# Patient Record
Sex: Female | Born: 1945
Health system: Southern US, Community
[De-identification: ages and names within clinical notes are randomized; demographics above are authoritative.]

## PROBLEM LIST (undated history)

## (undated) DIAGNOSIS — K5790 Diverticulosis of intestine, part unspecified, without perforation or abscess without bleeding: Secondary | ICD-10-CM

## (undated) DIAGNOSIS — D414 Neoplasm of uncertain behavior of bladder: Secondary | ICD-10-CM

## (undated) DIAGNOSIS — I10 Essential (primary) hypertension: Secondary | ICD-10-CM

## (undated) DIAGNOSIS — K644 Residual hemorrhoidal skin tags: Secondary | ICD-10-CM

## (undated) DIAGNOSIS — Z8619 Personal history of other infectious and parasitic diseases: Secondary | ICD-10-CM

## (undated) DIAGNOSIS — D131 Benign neoplasm of stomach: Secondary | ICD-10-CM

## (undated) DIAGNOSIS — D649 Anemia, unspecified: Secondary | ICD-10-CM

## (undated) DIAGNOSIS — T7840XA Allergy, unspecified, initial encounter: Secondary | ICD-10-CM

## (undated) DIAGNOSIS — R809 Proteinuria, unspecified: Secondary | ICD-10-CM

## (undated) DIAGNOSIS — F419 Anxiety disorder, unspecified: Secondary | ICD-10-CM

## (undated) DIAGNOSIS — I38 Endocarditis, valve unspecified: Secondary | ICD-10-CM

## (undated) DIAGNOSIS — Z7189 Other specified counseling: Secondary | ICD-10-CM

## (undated) DIAGNOSIS — R51 Headache: Secondary | ICD-10-CM

## (undated) DIAGNOSIS — M1990 Unspecified osteoarthritis, unspecified site: Secondary | ICD-10-CM

## (undated) DIAGNOSIS — T8859XA Other complications of anesthesia, initial encounter: Secondary | ICD-10-CM

## (undated) DIAGNOSIS — N184 Chronic kidney disease, stage 4 (severe): Secondary | ICD-10-CM

## (undated) DIAGNOSIS — G4700 Insomnia, unspecified: Secondary | ICD-10-CM

## (undated) DIAGNOSIS — F329 Major depressive disorder, single episode, unspecified: Secondary | ICD-10-CM

## (undated) DIAGNOSIS — N289 Disorder of kidney and ureter, unspecified: Secondary | ICD-10-CM

## (undated) DIAGNOSIS — I4891 Unspecified atrial fibrillation: Secondary | ICD-10-CM

## (undated) HISTORY — DX: Residual hemorrhoidal skin tags: K64.4

## (undated) HISTORY — DX: Other specified counseling: Z71.89

## (undated) HISTORY — DX: Benign neoplasm of stomach: D13.1

## (undated) HISTORY — DX: Allergy, unspecified, initial encounter: T78.40XA

## (undated) HISTORY — DX: Unspecified atrial fibrillation: I48.91

## (undated) HISTORY — DX: Anxiety disorder, unspecified: F41.9

## (undated) HISTORY — DX: Insomnia, unspecified: G47.00

## (undated) HISTORY — DX: Headache: R51

## (undated) HISTORY — DX: Anemia, unspecified: D64.9

## (undated) HISTORY — DX: Chronic kidney disease, stage 4 (severe): N18.4

## (undated) HISTORY — DX: Endocarditis, valve unspecified: I38

## (undated) HISTORY — DX: Diverticulosis of intestine, part unspecified, without perforation or abscess without bleeding: K57.90

## (undated) HISTORY — DX: Essential (primary) hypertension: I10

## (undated) HISTORY — DX: Hypercalcemia: E83.52

## (undated) HISTORY — PX: AV FISTULA PLACEMENT: SHX1204

## (undated) HISTORY — DX: Personal history of other infectious and parasitic diseases: Z86.19

## (undated) HISTORY — DX: Major depressive disorder, single episode, unspecified: F32.9

## (undated) HISTORY — DX: Proteinuria, unspecified: R80.9

## (undated) HISTORY — DX: Neoplasm of uncertain behavior of bladder: D41.4

## (undated) HISTORY — DX: Disorder of kidney and ureter, unspecified: N28.9

---
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
## 1970-02-02 HISTORY — PX: OTHER SURGICAL HISTORY: SHX169

## 1984-02-03 HISTORY — PX: NASAL SEPTUM SURGERY: SHX37

## 1989-02-02 DIAGNOSIS — F32A Depression, unspecified: Secondary | ICD-10-CM

## 1989-02-02 HISTORY — PX: OTHER SURGICAL HISTORY: SHX169

## 1989-02-02 HISTORY — DX: Depression, unspecified: F32.A

## 1998-03-26 ENCOUNTER — Other Ambulatory Visit: Admission: RE | Admit: 1998-03-26 | Discharge: 1998-03-26 | Payer: Self-pay | Admitting: Obstetrics and Gynecology

## 1998-07-04 ENCOUNTER — Other Ambulatory Visit: Admission: RE | Admit: 1998-07-04 | Discharge: 1998-07-04 | Payer: Self-pay | Admitting: Obstetrics and Gynecology

## 1999-03-03 ENCOUNTER — Encounter: Admission: RE | Admit: 1999-03-03 | Discharge: 1999-03-03 | Payer: Self-pay | Admitting: Internal Medicine

## 1999-03-03 ENCOUNTER — Encounter: Payer: Self-pay | Admitting: Internal Medicine

## 1999-06-27 ENCOUNTER — Other Ambulatory Visit: Admission: RE | Admit: 1999-06-27 | Discharge: 1999-06-27 | Payer: Self-pay | Admitting: *Deleted

## 2000-06-30 ENCOUNTER — Other Ambulatory Visit: Admission: RE | Admit: 2000-06-30 | Discharge: 2000-06-30 | Payer: Self-pay | Admitting: Obstetrics and Gynecology

## 2001-10-18 ENCOUNTER — Other Ambulatory Visit: Admission: RE | Admit: 2001-10-18 | Discharge: 2001-10-18 | Payer: Self-pay | Admitting: *Deleted

## 2003-06-20 ENCOUNTER — Other Ambulatory Visit: Admission: RE | Admit: 2003-06-20 | Discharge: 2003-06-20 | Payer: Self-pay | Admitting: Internal Medicine

## 2005-07-30 ENCOUNTER — Other Ambulatory Visit: Admission: RE | Admit: 2005-07-30 | Discharge: 2005-07-30 | Payer: Self-pay | Admitting: Obstetrics and Gynecology

## 2010-06-24 ENCOUNTER — Ambulatory Visit (INDEPENDENT_AMBULATORY_CARE_PROVIDER_SITE_OTHER): Payer: Self-pay | Admitting: Family Medicine

## 2010-06-24 ENCOUNTER — Encounter: Payer: Self-pay | Admitting: Family Medicine

## 2010-06-24 DIAGNOSIS — F32A Depression, unspecified: Secondary | ICD-10-CM

## 2010-06-24 DIAGNOSIS — Z8679 Personal history of other diseases of the circulatory system: Secondary | ICD-10-CM

## 2010-06-24 DIAGNOSIS — R51 Headache: Secondary | ICD-10-CM

## 2010-06-24 DIAGNOSIS — G4700 Insomnia, unspecified: Secondary | ICD-10-CM

## 2010-06-24 DIAGNOSIS — I10 Essential (primary) hypertension: Secondary | ICD-10-CM

## 2010-06-24 DIAGNOSIS — F329 Major depressive disorder, single episode, unspecified: Secondary | ICD-10-CM

## 2010-06-24 DIAGNOSIS — E663 Overweight: Secondary | ICD-10-CM

## 2010-06-24 DIAGNOSIS — G8929 Other chronic pain: Secondary | ICD-10-CM

## 2010-06-24 DIAGNOSIS — D649 Anemia, unspecified: Secondary | ICD-10-CM

## 2010-06-24 DIAGNOSIS — T7840XA Allergy, unspecified, initial encounter: Secondary | ICD-10-CM

## 2010-06-24 DIAGNOSIS — F419 Anxiety disorder, unspecified: Secondary | ICD-10-CM | POA: Insufficient documentation

## 2010-06-24 DIAGNOSIS — D414 Neoplasm of uncertain behavior of bladder: Secondary | ICD-10-CM

## 2010-06-24 DIAGNOSIS — F3289 Other specified depressive episodes: Secondary | ICD-10-CM

## 2010-06-24 DIAGNOSIS — R5383 Other fatigue: Secondary | ICD-10-CM

## 2010-06-24 DIAGNOSIS — R69 Illness, unspecified: Secondary | ICD-10-CM

## 2010-06-24 DIAGNOSIS — Z8619 Personal history of other infectious and parasitic diseases: Secondary | ICD-10-CM

## 2010-06-24 DIAGNOSIS — R5381 Other malaise: Secondary | ICD-10-CM

## 2010-06-24 HISTORY — DX: Insomnia, unspecified: G47.00

## 2010-06-24 HISTORY — DX: Other chronic pain: G89.29

## 2010-06-24 LAB — CBC WITH DIFFERENTIAL/PLATELET
Basophils Absolute: 0 10*3/uL (ref 0.0–0.1)
Basophils Relative: 0.3 % (ref 0.0–3.0)
Eosinophils Absolute: 0.1 10*3/uL (ref 0.0–0.7)
Eosinophils Relative: 2 % (ref 0.0–5.0)
HCT: 37.3 % (ref 36.0–46.0)
Hemoglobin: 12.7 g/dL (ref 12.0–15.0)
Lymphocytes Relative: 25.4 % (ref 12.0–46.0)
Lymphs Abs: 1.8 10*3/uL (ref 0.7–4.0)
MCHC: 34 g/dL (ref 30.0–36.0)
MCV: 83.4 fl (ref 78.0–100.0)
Monocytes Absolute: 0.6 10*3/uL (ref 0.1–1.0)
Monocytes Relative: 8.9 % (ref 3.0–12.0)
Neutro Abs: 4.6 10*3/uL (ref 1.4–7.7)
Neutrophils Relative %: 63.4 % (ref 43.0–77.0)
Platelets: 253 10*3/uL (ref 150.0–400.0)
RBC: 4.47 Mil/uL (ref 3.87–5.11)
RDW: 16.5 % — ABNORMAL HIGH (ref 11.5–14.6)
WBC: 7.2 10*3/uL (ref 4.5–10.5)

## 2010-06-24 LAB — HEPATIC FUNCTION PANEL
AST: 28 U/L (ref 0–37)
Albumin: 3.9 g/dL (ref 3.5–5.2)
Alkaline Phosphatase: 50 U/L (ref 39–117)
Bilirubin, Direct: 0 mg/dL (ref 0.0–0.3)
Total Bilirubin: 0.7 mg/dL (ref 0.3–1.2)

## 2010-06-24 LAB — RENAL FUNCTION PANEL
BUN: 30 mg/dL — ABNORMAL HIGH (ref 6–23)
CO2: 31 mEq/L (ref 19–32)
Chloride: 103 mEq/L (ref 96–112)
GFR: 39.19 mL/min — ABNORMAL LOW (ref >60.00)
Phosphorus: 3.6 mg/dL (ref 2.3–4.6)
Potassium: 5 mEq/L (ref 3.5–5.1)

## 2010-06-24 MED ORDER — LISINOPRIL 20 MG PO TABS: 20.0000 mg | ORAL_TABLET | Freq: Two times a day (BID) | ORAL | Status: DC

## 2010-06-24 NOTE — Patient Instructions (Signed)

## 2010-06-25 ENCOUNTER — Encounter: Payer: Self-pay | Admitting: Family Medicine

## 2010-06-25 DIAGNOSIS — D414 Neoplasm of uncertain behavior of bladder: Secondary | ICD-10-CM | POA: Insufficient documentation

## 2010-06-25 DIAGNOSIS — Z8619 Personal history of other infectious and parasitic diseases: Secondary | ICD-10-CM

## 2010-06-25 DIAGNOSIS — Z8679 Personal history of other diseases of the circulatory system: Secondary | ICD-10-CM | POA: Insufficient documentation

## 2010-06-25 DIAGNOSIS — T7840XA Allergy, unspecified, initial encounter: Secondary | ICD-10-CM | POA: Insufficient documentation

## 2010-06-25 HISTORY — DX: Allergy, unspecified, initial encounter: T78.40XA

## 2010-06-25 HISTORY — DX: Neoplasm of uncertain behavior of bladder: D41.4

## 2010-06-25 HISTORY — DX: Personal history of other infectious and parasitic diseases: Z86.19

## 2010-06-25 NOTE — Assessment & Plan Note (Signed)
She has never been maintained on any meds and does not feels she needs any at this time. She will notify us if that changes.

## 2010-06-25 NOTE — Assessment & Plan Note (Signed)
Patient reports good numbers at home and a history of being elevated at the doctors office but her numbers remained elevated on repeat so we will increase her Lisinopril 20mg  to BID. Reeval at next visit.

## 2010-06-25 NOTE — Progress Notes (Signed)
LAKETRA TVEDT [id number] 03/24/1945 06/25/2010      Progress Note New Patient  Subjective  Chief Complaint  Chief Complaint  Patient presents with  . Establish Care    new patient  . Fatigue    HPI  This is a 65 year old Caucasian female in today for new patient appointment with multiple concerns. Patient reports a long history of multiple chemical sensitivities came back to 90. 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 She quit the job the symptoms resolved. Unfortunately says that she's had recurrent episodes. At her most recent one was she went to her chiropractor recently and anticipated the furniture she's had fatigue numbness and headache since that time. That was May 10. She also donated plasma for the first timeand she is unclear how much of the fatigue could be related to that. She also notes weakness and lack of strength in her lower extremities. No recent illness, fevers, chills, chest pain, palpitation, shortness of breath, GI or GU complaints. Has a distant history of anxiety and depression which she was hostile as 4 back in 1991 and some cardiac dysrhythmias at that time. Sheriff to any medications until symptoms have resolved. She does see a gynecologist has her Paps and he did the last about 2 years ago did have one abnormal over 10 years ago but it resolved spontaneously. They did find a precancerous lesion on her left labia majora in the past but this also has not recurred. She says she underwent a sigmoidoscopy roughly 5 yrs ago which was normal  Past Medical History  Diagnosis Date  . Allergy     seasonal  . Anemia     iron deficiency  . Depression 1991    hospitalized  . Anxiety   . Hypertension   . Insomnia 06/24/2010  . Overweight 06/24/2010  . Fatigue 06/24/2010  . Chronic headaches 06/24/2010  . Multiple  chemical sensitivity syndrome 06/25/2010  . History of chicken pox 06/25/2010  . Bladder polyps 06/25/2010    Past Surgical History  Procedure Date  . Polyps on bladder removed 1972    benign  . Cyst on left breast removed 1991    benign  . Nasal septum surgery 1986  . Tonsillectomy 1962    Family History  Problem Relation Age of Onset  . Cancer Mother 92    breast- left breast removed, 2010 lung  . Nephrolithiasis Father   . Heart attack Father 44    X 3  . Heart disease Father     smoker  . Hypertension Brother   . Cancer Son 37    melanoma on leg removed  . Cancer Maternal Grandmother     pancreatic  . Hypertension Paternal Grandmother   . Obesity Paternal Grandmother     History   Social History  . Marital Status: Single    Spouse Name: N/A    Number of Children: N/A  . Years of Education: N/A   Occupational History  . Not on file.   Social History Main Topics  . Smoking status: Never Smoker   . Smokeless tobacco: Never Used  . Alcohol Use: No  . Drug Use: No  . Sexually Active: Yes -- Female partner(s)   Other Topics Concern  . Not on file   Social History Narrative  . No narrative on file  No current outpatient prescriptions on file prior to visit.    Allergies  Allergen Reactions  . Sulfa Antibiotics Nausea And Vomiting    Review of Systems  Review of Systems  Constitutional: Positive for malaise/fatigue. Negative for fever and chills.  HENT: Positive for tinnitus. Negative for hearing loss, nosebleeds and congestion.   Eyes: Negative for discharge.  Respiratory: Negative for cough, sputum production, shortness of breath and wheezing.   Cardiovascular: Negative for chest pain, palpitations and leg swelling.  Gastrointestinal: Negative for heartburn, nausea, vomiting, abdominal pain, diarrhea, constipation and blood in stool.  Genitourinary: Negative for dysuria, urgency, frequency and hematuria.  Musculoskeletal: Positive for joint pain.  Negative for myalgias, back pain and falls.       Hands L.>R  Skin: Negative for rash.  Neurological: Positive for headaches. Negative for dizziness, tremors, sensory change, focal weakness, loss of consciousness and weakness.  Endo/Heme/Allergies: Negative for polydipsia. Does not bruise/bleed easily.  Psychiatric/Behavioral: Negative for depression and suicidal ideas. The patient is not nervous/anxious and does not have insomnia.     Objective  BP 163/81  Pulse 65  Temp(Src) 97.9 F (36.6 C) (Oral)  Ht 5' 6.75" (1.695 m)  Wt 173 lb 12.8 oz (78.835 kg)  BMI 27.43 kg/m2  SpO2 97%  Physical Exam  Physical Exam  Constitutional: She is oriented to person, place, and time and well-developed, well-nourished, and in no distress. No distress.  HENT:  Head: Normocephalic and atraumatic.  Right Ear: External ear normal.  Left Ear: External ear normal.  Nose: Nose normal.  Mouth/Throat: Oropharynx is clear and moist. No oropharyngeal exudate.  Eyes: Conjunctivae are normal. Pupils are equal, round, and reactive to light. Right eye exhibits no discharge. Left eye exhibits no discharge. No scleral icterus.  Neck: Normal range of motion. Neck supple. No thyromegaly present.  Cardiovascular: Normal rate, regular rhythm and intact distal pulses.  Exam reveals gallop.   No murmur heard. Pulmonary/Chest: Effort normal and breath sounds normal. No respiratory distress. She has no wheezes. She has no rales.  Abdominal: Soft. Bowel sounds are normal. She exhibits no distension and no mass. There is no tenderness.  Musculoskeletal: Normal range of motion. She exhibits no edema and no tenderness.  Lymphadenopathy:    She has no cervical adenopathy.  Neurological: She is alert and oriented to person, place, and time. She has normal reflexes. No cranial nerve deficit. Coordination normal.  Skin: Skin is warm and dry. No rash noted. She is not diaphoretic.  Psychiatric: Mood, memory and affect normal.        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 When she left that environment it slowly improved but unfortunately she has had sporatic similar issues when she gets exposed to various chemicals and she also now has sensitivities to environmental pollens and toxins.  We will have her start an OTC antihistamine daily and reevaluate at next visit.   Overweight Encouraged increased activity and decreased po intake, avoid trans fats and eat small, frequent meals. Avoid trans fats and check a TSH  Insomnia Discussed good sleep hygiene and may try Melatonin and/or Benadryl  Hypertension Patient reports good numbers at home and a history of being elevated at the doctors office but her numbers remained elevated on repeat so we will increase her  Lisinopril 20mg  to BID. Reeval at next visit.  Fatigue Worse with recent chemical exposure and after donating Plasma for the first time, we will check labs and reevaluate at next visit.  Depression She has never been maintained on any meds and does not feels she needs any at this time. She will notify us if that changes.  Chronic headaches Worse since her recent chemical exposure, encouraged good sleep and hydration, may use OTC meds prn and reevaluate at next visit.  Anemia Has a documented h/o. With increased fatigue will check a CBC  Allergy Encouraged a nonsedating OTC antihistamine once to twice daily

## 2010-06-25 NOTE — Assessment & Plan Note (Signed)
Worse with recent chemical exposure and after donating Plasma for the first time, we will check labs and reevaluate at next visit.

## 2010-06-25 NOTE — Assessment & Plan Note (Signed)
Encouraged increased activity and decreased po intake, avoid trans fats and eat small, frequent meals. Avoid trans fats and check a TSH

## 2010-06-25 NOTE — Assessment & Plan Note (Signed)
Discussed good sleep hygiene and may try Melatonin and/or Benadryl

## 2010-06-25 NOTE — Assessment & Plan Note (Signed)
Has a documented h/o. With increased fatigue will check a CBC

## 2010-06-25 NOTE — Assessment & Plan Note (Addendum)
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 When she left that environment it slowly improved but unfortunately she has had sporatic similar issues when she gets exposed to various chemicals and she also now has sensitivities to environmental pollens and toxins.  We will have her start an OTC antihistamine daily and reevaluate at next visit.

## 2010-06-25 NOTE — Assessment & Plan Note (Signed)
Worse since her recent chemical exposure, encouraged good sleep and hydration, may use OTC meds prn and reevaluate at next visit.

## 2010-06-25 NOTE — Assessment & Plan Note (Signed)
Encouraged a nonsedating OTC antihistamine once to twice daily

## 2010-10-27 ENCOUNTER — Other Ambulatory Visit: Payer: Self-pay | Admitting: Family Medicine

## 2010-10-27 NOTE — Telephone Encounter (Signed)
Patient forgot to mention at last OV she needs a refill for Amlodipine sent to [facility] mail order pharmacy.

## 2010-10-27 NOTE — Telephone Encounter (Signed)
Pt is going to call tomorrow with a fax number

## 2010-10-28 MED ORDER — AMLODIPINE BESYLATE 5 MG PO TABS: 5.0000 mg | ORAL_TABLET | Freq: Every day | ORAL | Status: DC

## 2010-10-28 NOTE — Telephone Encounter (Signed)
Fax number [phone]

## 2010-12-09 ENCOUNTER — Other Ambulatory Visit: Payer: Self-pay

## 2010-12-09 DIAGNOSIS — I10 Essential (primary) hypertension: Secondary | ICD-10-CM

## 2010-12-09 MED ORDER — AMLODIPINE BESYLATE 5 MG PO TABS: 5.0000 mg | ORAL_TABLET | Freq: Every day | ORAL | Status: DC

## 2010-12-09 MED ORDER — LISINOPRIL 20 MG PO TABS: 20.0000 mg | ORAL_TABLET | Freq: Two times a day (BID) | ORAL | Status: DC

## 2010-12-09 MED ORDER — ATENOLOL 25 MG PO TABS: 25.0000 mg | ORAL_TABLET | Freq: Every day | ORAL | Status: DC

## 2011-01-08 ENCOUNTER — Other Ambulatory Visit: Payer: Self-pay | Admitting: Family Medicine

## 2011-01-30 ENCOUNTER — Other Ambulatory Visit: Payer: Self-pay | Admitting: Family Medicine

## 2011-01-30 MED ORDER — AMLODIPINE BESYLATE 5 MG PO TABS: 5.0000 mg | ORAL_TABLET | Freq: Every day | ORAL | Status: DC

## 2011-01-30 MED ORDER — ATENOLOL 25 MG PO TABS: 25.0000 mg | ORAL_TABLET | Freq: Every day | ORAL | Status: DC

## 2011-01-30 NOTE — Telephone Encounter (Signed)
RX sent electronically 

## 2011-03-23 ENCOUNTER — Encounter: Payer: Self-pay | Admitting: Family Medicine

## 2011-03-23 ENCOUNTER — Ambulatory Visit (INDEPENDENT_AMBULATORY_CARE_PROVIDER_SITE_OTHER): Payer: Medicare Other | Admitting: Family Medicine

## 2011-03-23 VITALS — BP 146/78 | HR 62 | Temp 97.5°F | Ht 66.75 in | Wt 176.0 lb

## 2011-03-23 DIAGNOSIS — N289 Disorder of kidney and ureter, unspecified: Secondary | ICD-10-CM

## 2011-03-23 DIAGNOSIS — I10 Essential (primary) hypertension: Secondary | ICD-10-CM

## 2011-03-23 DIAGNOSIS — D649 Anemia, unspecified: Secondary | ICD-10-CM

## 2011-03-23 DIAGNOSIS — Z1211 Encounter for screening for malignant neoplasm of colon: Secondary | ICD-10-CM

## 2011-03-23 DIAGNOSIS — G4700 Insomnia, unspecified: Secondary | ICD-10-CM

## 2011-03-23 LAB — RENAL FUNCTION PANEL
Albumin: 3.9 g/dL (ref 3.5–5.2)
Calcium: 9.9 mg/dL (ref 8.4–10.5)
Creatinine, Ser: 1.6 mg/dL — ABNORMAL HIGH (ref 0.4–1.2)
Glucose, Bld: 67 mg/dL — ABNORMAL LOW (ref 70–99)
Phosphorus: 4.4 mg/dL (ref 2.3–4.6)
Potassium: 4.7 mEq/L (ref 3.5–5.1)

## 2011-03-23 LAB — CBC
RBC: 4.31 Mil/uL (ref 3.87–5.11)
RDW: 15.8 % — ABNORMAL HIGH (ref 11.5–14.6)
WBC: 5.7 10*3/uL (ref 4.5–10.5)

## 2011-03-23 LAB — HEPATIC FUNCTION PANEL
ALT: 18 U/L (ref 0–35)
AST: 27 U/L (ref 0–37)
Albumin: 3.9 g/dL (ref 3.5–5.2)
Total Protein: 6.8 g/dL (ref 6.0–8.3)

## 2011-03-23 NOTE — Patient Instructions (Signed)

## 2011-03-25 NOTE — Progress Notes (Signed)
Pt stated that MD had mentioned doing the hemoccult test but never got the cards? Pt is wandering if md wants her still to do this or wait until a later date? Please advise?

## 2011-03-26 ENCOUNTER — Encounter: Payer: Self-pay | Admitting: Family Medicine

## 2011-03-26 DIAGNOSIS — N289 Disorder of kidney and ureter, unspecified: Secondary | ICD-10-CM

## 2011-03-26 HISTORY — DX: Disorder of kidney and ureter, unspecified: N28.9

## 2011-03-26 NOTE — Assessment & Plan Note (Signed)
Struggles with sleep at times, may try Benadryl prn

## 2011-03-26 NOTE — Progress Notes (Signed)
Patient ID: Jillian Hayes, female   DOB: 1945/10/26, 66 y.o.   MRN: [id number] MISSI TOWELL [id number] 04-May-1945 03/26/2011      Progress Note-Follow Up  Subjective  Chief Complaint  Chief Complaint  Patient presents with  . Hypertension    follow up on meds, home BP's    HPI   patient is a 66 year old female who is in today for followup. She has been struggling with some insomnia off and on. She's not had a recent illness. No headaches, congestion, fever, chest pain, palpitations, shortness of breath, GI or GU complaints.  Past Medical History  Diagnosis Date  . Allergy     seasonal  . Anemia     iron deficiency  . Depression 1991    hospitalized  . Anxiety   . Hypertension   . Insomnia 06/24/2010  . Overweight 06/24/2010  . Fatigue 06/24/2010  . Chronic headaches 06/24/2010  . Multiple chemical sensitivity syndrome 06/25/2010  . History of chicken pox 06/25/2010  . Bladder polyps 06/25/2010  . Renal insufficiency 03/26/2011    Past Surgical History  Procedure Date  . Polyps on bladder removed 1972    benign  . Cyst on left breast removed 1991    benign  . Nasal septum surgery 1986  . Tonsillectomy 1962    Family History  Problem Relation Age of Onset  . Cancer Mother 20    breast- left breast removed, 2010 lung  . Nephrolithiasis Father   . Heart attack Father 68    X 3  . Heart disease Father     smoker  . Hypertension Brother   . Cancer Son 37    melanoma on leg removed  . Cancer Maternal Grandmother     pancreatic  . Hypertension Paternal Grandmother   . Obesity Paternal Grandmother     History   Social History  . Marital Status: Single    Spouse Name: N/A    Number of Children: N/A  . Years of Education: N/A   Occupational History  . Not on file.   Social History Main Topics  . Smoking status: Never Smoker   . Smokeless tobacco: Never Used  . Alcohol Use: No  . Drug Use: No  . Sexually Active: Yes -- Female partner(s)   Other Topics  Concern  . Not on file   Social History Narrative  . No narrative on file    Current Outpatient Prescriptions on File Prior to Visit  Medication Sig Dispense Refill  . ALFALFA PO Take 4,500 mg by mouth daily.        Marland Kitchen amLODipine (NORVASC) 5 MG tablet Take 1 tablet (5 mg total) by mouth daily.  30 tablet  0  . atenolol (TENORMIN) 25 MG tablet Take 1 tablet (25 mg total) by mouth daily.  30 tablet  0  . Calcium Carbonate-Vitamin D (CALCIUM + D PO) Take 1,000 mg by mouth daily.        . cholecalciferol (VITAMIN D) 1000 UNITS tablet Take 1,000 Units by mouth daily.        . Ferrous Fumarate (IRON) 18 MG TBCR Take 1 tablet by mouth daily.        Marland Kitchen lisinopril (PRINIVIL,ZESTRIL) 20 MG tablet Take 1 tablet (20 mg total) by mouth 2 (two) times daily.  180 tablet  1  . vitamin C (ASCORBIC ACID) 500 MG tablet Take 500 mg by mouth as needed.          Allergies  Allergen Reactions  . Sulfa Antibiotics Nausea And Vomiting    Review of Systems  Review of Systems  Constitutional: Negative for fever and malaise/fatigue.  HENT: Negative for congestion.   Eyes: Negative for discharge.  Respiratory: Negative for shortness of breath.   Cardiovascular: Negative for chest pain, palpitations and leg swelling.  Gastrointestinal: Negative for nausea, abdominal pain and diarrhea.  Genitourinary: Negative for dysuria.  Musculoskeletal: Negative for falls.  Skin: Negative for rash.  Neurological: Negative for loss of consciousness and headaches.  Endo/Heme/Allergies: Negative for polydipsia.  Psychiatric/Behavioral: Negative for depression and suicidal ideas. The patient has insomnia. The patient is not nervous/anxious.     Objective  BP 146/78  Pulse 62  Temp(Src) 97.5 F (36.4 C) (Oral)  Ht 5' 6.75" (1.695 m)  Wt 176 lb (79.833 kg)  BMI 27.77 kg/m2  SpO2 99%  Physical Exam  Physical Exam  Constitutional: She is oriented to person, place, and time and well-developed, well-nourished, and in  no distress. No distress.  HENT:  Head: Normocephalic and atraumatic.  Eyes: Conjunctivae are normal.  Neck: Neck supple. No thyromegaly present.  Cardiovascular: Normal rate, regular rhythm and normal heart sounds.   No murmur heard. Pulmonary/Chest: Effort normal and breath sounds normal. She has no wheezes.  Abdominal: She exhibits no distension and no mass.  Musculoskeletal: She exhibits no edema.  Lymphadenopathy:    She has no cervical adenopathy.  Neurological: She is alert and oriented to person, place, and time.  Skin: Skin is warm and dry. No rash noted. She is not diaphoretic.  Psychiatric: Memory, affect and judgment normal.    Lab Results  Component Value Date   TSH 0.61 03/23/2011   Lab Results  Component Value Date   WBC 5.7 03/23/2011   HGB 12.1 03/23/2011   HCT 35.9* 03/23/2011   MCV 83.2 03/23/2011   PLT 268.0 03/23/2011   Lab Results  Component Value Date   CREATININE 1.6* 03/23/2011   BUN 38* 03/23/2011   NA 142 03/23/2011   K 4.7 03/23/2011   CL 104 03/23/2011   CO2 32 03/23/2011   Lab Results  Component Value Date   ALT 18 03/23/2011   AST 27 03/23/2011   ALKPHOS 55 03/23/2011   BILITOT 0.3 03/23/2011       Assessment & Plan  Hypertension Patient reports bp of 129/62 at home this am. Will continue Amlodipine, Lisinopril and Atenolol  Renal insufficiency Stable on recheck today encouraged good hydration  Anemia Very mild, will continue to monitor. Is given FOBtesting kit. She believes she last had a sigmoidoscopy greater than 10 years but is declining referral for colonoscopy at this time  Insomnia Struggles with sleep at times, may try Benadryl prn

## 2011-03-26 NOTE — Assessment & Plan Note (Signed)
Very mild, will continue to monitor. Is given FOBtesting kit. She believes she last had a sigmoidoscopy greater than 10 years but is declining referral for colonoscopy at this time

## 2011-03-26 NOTE — Assessment & Plan Note (Addendum)
Patient reports bp of 129/62 at home this am. Will continue Amlodipine, Lisinopril and Atenolol

## 2011-03-26 NOTE — Assessment & Plan Note (Signed)
Stable on recheck today encouraged good hydration

## 2011-04-08 ENCOUNTER — Other Ambulatory Visit: Payer: Medicare Other

## 2011-04-08 DIAGNOSIS — Z1211 Encounter for screening for malignant neoplasm of colon: Secondary | ICD-10-CM

## 2011-04-08 LAB — FECAL OCCULT BLOOD, IMMUNOCHEMICAL: Fecal Occult Bld: NEGATIVE

## 2011-07-10 ENCOUNTER — Other Ambulatory Visit: Payer: Self-pay

## 2011-07-10 DIAGNOSIS — I10 Essential (primary) hypertension: Secondary | ICD-10-CM

## 2011-07-10 MED ORDER — LISINOPRIL 20 MG PO TABS: 20.0000 mg | ORAL_TABLET | Freq: Two times a day (BID) | ORAL | Status: DC

## 2011-07-31 ENCOUNTER — Other Ambulatory Visit: Payer: Self-pay

## 2011-07-31 MED ORDER — ATENOLOL 25 MG PO TABS: 25.0000 mg | ORAL_TABLET | Freq: Every day | ORAL | Status: DC

## 2011-07-31 MED ORDER — AMLODIPINE BESYLATE 5 MG PO TABS: 5.0000 mg | ORAL_TABLET | Freq: Every day | ORAL | Status: DC

## 2011-11-03 ENCOUNTER — Ambulatory Visit: Payer: Medicare Other | Admitting: Family Medicine

## 2011-11-09 ENCOUNTER — Encounter: Payer: Self-pay | Admitting: Family Medicine

## 2011-11-09 ENCOUNTER — Ambulatory Visit (INDEPENDENT_AMBULATORY_CARE_PROVIDER_SITE_OTHER): Payer: Medicare Other | Admitting: Family Medicine

## 2011-11-09 VITALS — BP 155/71 | HR 61 | Temp 96.8°F | Ht 66.75 in | Wt 163.0 lb

## 2011-11-09 DIAGNOSIS — G4700 Insomnia, unspecified: Secondary | ICD-10-CM

## 2011-11-09 DIAGNOSIS — Z23 Encounter for immunization: Secondary | ICD-10-CM

## 2011-11-09 DIAGNOSIS — Z0000 Encounter for general adult medical examination without abnormal findings: Secondary | ICD-10-CM | POA: Insufficient documentation

## 2011-11-09 DIAGNOSIS — Z8619 Personal history of other infectious and parasitic diseases: Secondary | ICD-10-CM

## 2011-11-09 DIAGNOSIS — I10 Essential (primary) hypertension: Secondary | ICD-10-CM

## 2011-11-09 MED ORDER — ZOSTER VACCINE LIVE 19400 UNT/0.65ML ~~LOC~~ SOLR: 0.6500 mL | Freq: Once | SUBCUTANEOUS | Status: DC

## 2011-11-09 NOTE — Assessment & Plan Note (Signed)
Declines flu shot because she has to go handle her mother's estate but she agrees to get shot next week

## 2011-11-09 NOTE — Assessment & Plan Note (Signed)
Patient reports she checks her bp at home and generally sees 120s over 70s. Avoid sodium, caffeine and check TSH and other labs prior to next visit.

## 2011-11-09 NOTE — Assessment & Plan Note (Signed)
She reports she is doing well but did just suffer the loss of her mother this past week to lung cancer and pneumonia, she will notify us if she needs any help.

## 2011-11-09 NOTE — Assessment & Plan Note (Signed)
given a prescription for Zostavax to get at pharmacy today

## 2011-11-09 NOTE — Patient Instructions (Addendum)

## 2011-11-09 NOTE — Progress Notes (Signed)
Patient ID: Jillian Hayes, female   DOB: 19-Sep-1945, 66 y.o.   MRN: [id number] Jillian Hayes [id number] Jul 02, 1945 11/09/2011      Progress Note-Follow Up  Subjective  Chief Complaint  Chief Complaint  Patient presents with  . Follow-up    wants CPE, not scheduled as physical    HPI  Patient is a 66 year old Caucasian female who is here today in followup. She reports overall she's done relatively well. She's been checking her blood pressure at home and reports her sugars numbers are generally 120s over 70s. Unfortunately her mother did die suddenly last week at anterior pneumonia. She feels she's handling it relatively well but he is leaving to go handling her mom's estate and home. She denies any congestion, fever, chest pain, palpitations, shortness of breath, GI or GU complaints at this time.  Past Medical History  Diagnosis Date  . Allergy     seasonal  . Anemia     iron deficiency  . Depression 1991    hospitalized  . Anxiety   . Hypertension   . Insomnia 06/24/2010  . Overweight 06/24/2010  . Fatigue 06/24/2010  . Chronic headaches 06/24/2010  . Multiple chemical sensitivity syndrome 06/25/2010  . History of chicken pox 06/25/2010  . Bladder polyps 06/25/2010  . Renal insufficiency 03/26/2011  . Preventative health care 11/09/2011    Past Surgical History  Procedure Date  . Polyps on bladder removed 1972    benign  . Cyst on left breast removed 1991    benign  . Nasal septum surgery 1986  . Tonsillectomy 1962    Family History  Problem Relation Age of Onset  . Cancer Mother 54    breast- left breast removed, 2010 lung  . Nephrolithiasis Father   . Heart attack Father 72    X 3  . Heart disease Father     smoker  . Hypertension Brother   . Cancer Son 37    melanoma on leg removed  . Cancer Maternal Grandmother     pancreatic  . Hypertension Paternal Grandmother   . Obesity Paternal Grandmother     History   Social History  . Marital Status: Single      Spouse Name: N/A    Number of Children: N/A  . Years of Education: N/A   Occupational History  . Not on file.   Social History Main Topics  . Smoking status: Never Smoker   . Smokeless tobacco: Never Used  . Alcohol Use: No  . Drug Use: No  . Sexually Active: Yes -- Female partner(s)   Other Topics Concern  . Not on file   Social History Narrative  . No narrative on file    Current Outpatient Prescriptions on File Prior to Visit  Medication Sig Dispense Refill  . ALFALFA PO Take 4,500 mg by mouth daily.        Marland Kitchen amLODipine (NORVASC) 5 MG tablet Take 1 tablet (5 mg total) by mouth daily.  90 tablet  1  . atenolol (TENORMIN) 25 MG tablet Take 1 tablet (25 mg total) by mouth daily.  90 tablet  1  . Calcium Carbonate-Vitamin D (CALCIUM + D PO) Take 1,000 mg by mouth daily.        . cholecalciferol (VITAMIN D) 1000 UNITS tablet Take 1,000 Units by mouth daily.        . Ferrous Fumarate (IRON) 18 MG TBCR Take 1 tablet by mouth daily.        Marland Kitchen  Flaxseed, Linseed, (FLAX SEED OIL PO) Take 1 capsule by mouth 3 (three) times daily.      Marland Kitchen lisinopril (PRINIVIL,ZESTRIL) 20 MG tablet Take 1 tablet (20 mg total) by mouth 2 (two) times daily.  180 tablet  1  . vitamin C (ASCORBIC ACID) 500 MG tablet Take 500 mg by mouth as needed.          Allergies  Allergen Reactions  . Sulfa Antibiotics Nausea And Vomiting    Review of Systems  Review of Systems  Constitutional: Negative for fever and malaise/fatigue.  HENT: Negative for congestion.   Eyes: Negative for discharge.  Respiratory: Negative for shortness of breath.   Cardiovascular: Negative for chest pain, palpitations and leg swelling.  Gastrointestinal: Negative for nausea, abdominal pain and diarrhea.  Genitourinary: Negative for dysuria.  Musculoskeletal: Negative for falls.  Skin: Negative for rash.  Neurological: Negative for loss of consciousness and headaches.  Endo/Heme/Allergies: Negative for polydipsia.   Psychiatric/Behavioral: Negative for depression and suicidal ideas. The patient is nervous/anxious and has insomnia.     Objective  BP 155/71  Pulse 61  Temp 96.8 F (36 C) (Temporal)  Ht 5' 6.75" (1.695 m)  Wt 163 lb (73.936 kg)  BMI 25.72 kg/m2  SpO2 96%  Physical Exam  Physical Exam  Constitutional: She is oriented to person, place, and time and well-developed, well-nourished, and in no distress. No distress.  HENT:  Head: Normocephalic and atraumatic.  Eyes: Conjunctivae normal are normal.  Neck: Neck supple. No thyromegaly present.  Cardiovascular: Normal rate, regular rhythm and normal heart sounds.   No murmur heard. Pulmonary/Chest: Effort normal and breath sounds normal. She has no wheezes.  Abdominal: She exhibits no distension and no mass.  Musculoskeletal: She exhibits no edema.  Lymphadenopathy:    She has no cervical adenopathy.  Neurological: She is alert and oriented to person, place, and time.  Skin: Skin is warm and dry. No rash noted. She is not diaphoretic.  Psychiatric: Memory, affect and judgment normal.    Lab Results  Component Value Date   TSH 0.61 03/23/2011   Lab Results  Component Value Date   WBC 5.7 03/23/2011   HGB 12.1 03/23/2011   HCT 35.9* 03/23/2011   MCV 83.2 03/23/2011   PLT 268.0 03/23/2011   Lab Results  Component Value Date   CREATININE 1.6* 03/23/2011   BUN 38* 03/23/2011   NA 142 03/23/2011   K 4.7 03/23/2011   CL 104 03/23/2011   CO2 32 03/23/2011   Lab Results  Component Value Date   ALT 18 03/23/2011   AST 27 03/23/2011   ALKPHOS 55 03/23/2011   BILITOT 0.3 03/23/2011   Assessment & Plan  Hypertension Patient reports she checks her bp at home and generally sees 120s over 14s. Avoid sodium, caffeine and check TSH and other labs prior to next visit.  Insomnia She reports she is doing well but did just suffer the loss of her mother this past week to lung cancer and pneumonia, she will notify us if she needs any  help.  History of chicken pox given a prescription for Zostavax to get at pharmacy today  Preventative health care Declines flu shot because she has to go handle her mother's estate but she agrees to get shot next week

## 2012-01-14 ENCOUNTER — Other Ambulatory Visit: Payer: Self-pay

## 2012-01-14 DIAGNOSIS — I10 Essential (primary) hypertension: Secondary | ICD-10-CM

## 2012-01-14 MED ORDER — LISINOPRIL 20 MG PO TABS: 20.0000 mg | ORAL_TABLET | Freq: Two times a day (BID) | ORAL | Status: DC

## 2012-01-14 NOTE — Telephone Encounter (Signed)
Pt needs a 90 day refill of Lisinopril sent to [facility] and a 30 day sent to [facility]

## 2012-01-28 ENCOUNTER — Other Ambulatory Visit: Payer: Self-pay | Admitting: Family Medicine

## 2012-01-28 MED ORDER — ATENOLOL 25 MG PO TABS: 25.0000 mg | ORAL_TABLET | Freq: Every day | ORAL | Status: DC

## 2012-01-28 NOTE — Telephone Encounter (Signed)
RX sent to pharmacy  

## 2012-02-03 HISTORY — PX: ESOPHAGOGASTRODUODENOSCOPY (EGD) WITH ESOPHAGEAL DILATION: SHX5812

## 2012-02-03 HISTORY — PX: COLONOSCOPY: SHX174

## 2012-03-22 ENCOUNTER — Encounter: Payer: Self-pay | Admitting: Family Medicine

## 2012-03-24 ENCOUNTER — Telehealth: Payer: Self-pay | Admitting: Family Medicine

## 2012-03-24 NOTE — Telephone Encounter (Signed)
Refill- amlodipine besylate 5mg  tablet. Take one by mouth daily. Qty 90 days supply

## 2012-03-25 MED ORDER — AMLODIPINE BESYLATE 5 MG PO TABS: 5.0000 mg | ORAL_TABLET | Freq: Every day | ORAL | Status: DC

## 2012-03-25 NOTE — Telephone Encounter (Signed)
Refill sent.

## 2012-04-01 ENCOUNTER — Other Ambulatory Visit (INDEPENDENT_AMBULATORY_CARE_PROVIDER_SITE_OTHER): Payer: Medicare Other

## 2012-04-01 DIAGNOSIS — Z0000 Encounter for general adult medical examination without abnormal findings: Secondary | ICD-10-CM

## 2012-04-01 DIAGNOSIS — E039 Hypothyroidism, unspecified: Secondary | ICD-10-CM

## 2012-04-01 LAB — LIPID PANEL
HDL: 78.1 mg/dL (ref >39.00)
LDL Cholesterol: 83 mg/dL (ref 0–99)
Total CHOL/HDL Ratio: 2
Triglycerides: 86 mg/dL (ref 0.0–149.0)

## 2012-04-01 LAB — HEPATIC FUNCTION PANEL
ALT: 27 U/L (ref 0–35)
AST: 35 U/L (ref 0–37)
Albumin: 3.2 g/dL — ABNORMAL LOW (ref 3.5–5.2)
Total Bilirubin: 0.6 mg/dL (ref 0.3–1.2)

## 2012-04-01 LAB — RENAL FUNCTION PANEL
Albumin: 3.2 g/dL — ABNORMAL LOW (ref 3.5–5.2)
BUN: 25 mg/dL — ABNORMAL HIGH (ref 6–23)
Calcium: 9.9 mg/dL (ref 8.4–10.5)
Glucose, Bld: 71 mg/dL (ref 70–99)
Phosphorus: 3.2 mg/dL (ref 2.3–4.6)
Potassium: 4.6 mEq/L (ref 3.5–5.1)

## 2012-04-01 LAB — CBC WITH DIFFERENTIAL/PLATELET
Basophils Absolute: 0 10*3/uL (ref 0.0–0.1)
Eosinophils Absolute: 0.1 10*3/uL (ref 0.0–0.7)
Hemoglobin: 11.2 g/dL — ABNORMAL LOW (ref 12.0–15.0)
Lymphocytes Relative: 41.1 % (ref 12.0–46.0)
MCHC: 33.4 g/dL (ref 30.0–36.0)
Monocytes Relative: 8.8 % (ref 3.0–12.0)
Neutrophils Relative %: 47.4 % (ref 43.0–77.0)
RDW: 14.6 % (ref 11.5–14.6)

## 2012-04-01 LAB — TSH: TSH: 0.29 u[IU]/mL — ABNORMAL LOW (ref 0.35–5.50)

## 2012-04-01 NOTE — Progress Notes (Signed)
Labs only

## 2012-04-04 LAB — T4, FREE: Free T4: 1.4 ng/dL (ref 0.80–1.80)

## 2012-04-04 NOTE — Addendum Note (Signed)
Addended by: Darlina Sicilian R on: 04/04/2012 08:10 AM   Modules accepted: Orders

## 2012-04-05 NOTE — Progress Notes (Signed)
Quick Note:  Patient Informed and voiced understanding ______ 

## 2012-04-08 ENCOUNTER — Encounter: Payer: Medicare Other | Admitting: Family Medicine

## 2012-04-21 ENCOUNTER — Ambulatory Visit (HOSPITAL_BASED_OUTPATIENT_CLINIC_OR_DEPARTMENT_OTHER)
Admission: RE | Admit: 2012-04-21 | Discharge: 2012-04-21 | Disposition: A | Discharge status: Home / Self Care | Payer: Medicare Other | Source: Ambulatory Visit | Attending: *Deleted | Admitting: *Deleted

## 2012-04-21 ENCOUNTER — Other Ambulatory Visit (HOSPITAL_BASED_OUTPATIENT_CLINIC_OR_DEPARTMENT_OTHER): Payer: Self-pay | Admitting: *Deleted

## 2012-04-21 DIAGNOSIS — M542 Cervicalgia: Secondary | ICD-10-CM

## 2012-04-21 DIAGNOSIS — M545 Low back pain, unspecified: Secondary | ICD-10-CM

## 2012-06-10 ENCOUNTER — Encounter: Payer: Self-pay | Admitting: Family Medicine

## 2012-06-10 ENCOUNTER — Other Ambulatory Visit (HOSPITAL_COMMUNITY)
Admission: RE | Admit: 2012-06-10 | Discharge: 2012-06-10 | Disposition: A | Discharge status: Home / Self Care | Payer: Medicare Other | Source: Ambulatory Visit | Attending: Family Medicine | Admitting: Family Medicine

## 2012-06-10 ENCOUNTER — Ambulatory Visit (INDEPENDENT_AMBULATORY_CARE_PROVIDER_SITE_OTHER): Payer: Medicare Other | Admitting: Family Medicine

## 2012-06-10 VITALS — BP 130/80 | HR 62 | Temp 97.9°F | Ht 66.75 in | Wt 167.0 lb

## 2012-06-10 DIAGNOSIS — T7840XA Allergy, unspecified, initial encounter: Secondary | ICD-10-CM

## 2012-06-10 DIAGNOSIS — Z1211 Encounter for screening for malignant neoplasm of colon: Secondary | ICD-10-CM

## 2012-06-10 DIAGNOSIS — Z23 Encounter for immunization: Secondary | ICD-10-CM

## 2012-06-10 DIAGNOSIS — Z01419 Encounter for gynecological examination (general) (routine) without abnormal findings: Secondary | ICD-10-CM

## 2012-06-10 DIAGNOSIS — R69 Illness, unspecified: Secondary | ICD-10-CM

## 2012-06-10 DIAGNOSIS — Z0000 Encounter for general adult medical examination without abnormal findings: Secondary | ICD-10-CM

## 2012-06-10 DIAGNOSIS — E079 Disorder of thyroid, unspecified: Secondary | ICD-10-CM

## 2012-06-10 DIAGNOSIS — R197 Diarrhea, unspecified: Secondary | ICD-10-CM

## 2012-06-10 DIAGNOSIS — Z124 Encounter for screening for malignant neoplasm of cervix: Secondary | ICD-10-CM | POA: Insufficient documentation

## 2012-06-10 DIAGNOSIS — R946 Abnormal results of thyroid function studies: Secondary | ICD-10-CM

## 2012-06-10 DIAGNOSIS — D649 Anemia, unspecified: Secondary | ICD-10-CM

## 2012-06-10 DIAGNOSIS — I10 Essential (primary) hypertension: Secondary | ICD-10-CM

## 2012-06-10 NOTE — Patient Instructions (Addendum)
Start a probiotic such as Digestive Advantage daily Start a fiber supplement such as Benefiber twice a day If CDiff is negative can use Imodium when yo   Consider a pneumonia shot, can call for a nurse visit.

## 2012-06-12 ENCOUNTER — Encounter: Payer: Self-pay | Admitting: Family Medicine

## 2012-06-12 DIAGNOSIS — R197 Diarrhea, unspecified: Secondary | ICD-10-CM | POA: Insufficient documentation

## 2012-06-12 NOTE — Assessment & Plan Note (Signed)
Notes some intermittent explosive diarrea and increased gaseousness s/p a course of Amoxicilin followed by Clindamycin by her dentist. Encouraged a probiotic daily, check stool for CDiff and if negative may use Imodium prn for an up coming trip. If she continues to have trouble upon her return may need referral to GI. Given an IFOB kit to complete as well.

## 2012-06-12 NOTE — Assessment & Plan Note (Signed)
Well controlled on current meds, no changes

## 2012-06-12 NOTE — Progress Notes (Signed)
Patient ID: Jillian Hayes, female   DOB: 1945/12/03, 67 y.o.   MRN: [id number] LADERRICKA MOLAND [id number] 1945/12/11 06/12/2012      Progress Note-Follow Up  Subjective  Chief Complaint  Chief Complaint  Patient presents with  . Annual Exam    physical  . Gynecologic Exam    pap and breast exam    HPI  Patient is a 67 year old Caucasian female who is in today for Pap smear and followup. She notes over the last month she's been undergoing a lot of dental work with a bad root canal. She is given a course of amoxicillin and clindamycin and ever since then has been having trouble with intermittent diarrhea. Describes some days without a bowel movement other days with multiple explosive episodes. No bloody or tarry stool. No fevers or chills. No nausea, vomiting, anorexia. No increased abdominal or back pain. She does note she's had a long history of intermittent constipation and diarrhea but symptoms have worsened. She also reports a long history of some mild right-sided weakness she is afraid is slowly progressing. No other complaints of recent illness. No chest pain or palpitations. No shortness of breath, headaches, congestion or GYN complaints  Past Medical History  Diagnosis Date  . Allergy     seasonal  . Anemia     iron deficiency  . Depression 1991    hospitalized  . Anxiety   . Hypertension   . Insomnia 06/24/2010  . Overweight 06/24/2010  . Fatigue 06/24/2010  . Chronic headaches 06/24/2010  . Multiple chemical sensitivity syndrome 06/25/2010  . History of chicken pox 06/25/2010  . Bladder polyps 06/25/2010  . Renal insufficiency 03/26/2011  . Preventative health care 11/09/2011  . Diarrhea 06/12/2012    Past Surgical History  Procedure Laterality Date  . Polyps on bladder removed  1972    benign  . Cyst on left breast removed  1991    benign  . Nasal septum surgery  1986  . Tonsillectomy  1962    Family History  Problem Relation Age of Onset  . Cancer Mother 15   breast- left breast removed, 2010 lung  . Nephrolithiasis Father   . Heart attack Father 64    X 3  . Heart disease Father     smoker  . Hypertension Brother   . Cancer Son 37    melanoma on leg removed  . Cancer Maternal Grandmother     pancreatic  . Hypertension Paternal Grandmother   . Obesity Paternal Grandmother     History   Social History  . Marital Status: Married    Spouse Name: N/A    Number of Children: N/A  . Years of Education: N/A   Occupational History  . Not on file.   Social History Main Topics  . Smoking status: Never Smoker   . Smokeless tobacco: Never Used  . Alcohol Use: No  . Drug Use: No  . Sexually Active: Yes -- Female partner(s)   Other Topics Concern  . Not on file   Social History Narrative  . No narrative on file    Current Outpatient Prescriptions on File Prior to Visit  Medication Sig Dispense Refill  . ALFALFA PO Take 4,500 mg by mouth daily.        Marland Kitchen amLODipine (NORVASC) 5 MG tablet Take 1 tablet (5 mg total) by mouth daily.  90 tablet  1  . aspirin EC 81 MG tablet Take 81 mg by mouth daily.      Marland Kitchen  atenolol (TENORMIN) 25 MG tablet Take 1 tablet (25 mg total) by mouth daily.  90 tablet  1  . Calcium Carbonate-Vitamin D (CALCIUM + D PO) Take 1,000 mg by mouth daily.        . cholecalciferol (VITAMIN D) 1000 UNITS tablet Take 1,000 Units by mouth daily.        . Ferrous Fumarate (IRON) 18 MG TBCR Take 1 tablet by mouth daily.        . Flaxseed, Linseed, (FLAX SEED OIL PO) Take 1 capsule by mouth 3 (three) times daily.      Marland Kitchen lisinopril (PRINIVIL,ZESTRIL) 20 MG tablet Take 1 tablet (20 mg total) by mouth 2 (two) times daily.  180 tablet  1  . vitamin C (ASCORBIC ACID) 500 MG tablet Take 500 mg by mouth as needed.         No current facility-administered medications on file prior to visit.    Allergies  Allergen Reactions  . Sulfa Antibiotics Nausea And Vomiting    Review of Systems  Review of Systems  Constitutional: Negative  for fever and malaise/fatigue.  HENT: Negative for congestion.   Eyes: Negative for discharge.  Respiratory: Negative for shortness of breath.   Cardiovascular: Negative for chest pain, palpitations and leg swelling.  Gastrointestinal: Positive for abdominal pain and diarrhea. Negative for nausea.  Genitourinary: Negative for dysuria.  Musculoskeletal: Negative for falls.  Skin: Negative for rash.  Neurological: Positive for focal weakness. Negative for loss of consciousness and headaches.       Describes years worth of mild right sided weakness, upper and lower extremities, may be worsening recently  Endo/Heme/Allergies: Negative for polydipsia.  Psychiatric/Behavioral: Negative for depression and suicidal ideas. The patient is not nervous/anxious and does not have insomnia.     Objective  BP 130/80  Pulse 62  Temp(Src) 97.9 F (36.6 C) (Oral)  Ht 5' 6.75" (1.695 m)  Wt 167 lb (75.751 kg)  BMI 26.37 kg/m2  SpO2 96%  Physical Exam  Physical Exam  Constitutional: She is oriented to person, place, and time and well-developed, well-nourished, and in no distress. No distress.  HENT:  Head: Normocephalic and atraumatic.  Right Ear: External ear normal.  Left Ear: External ear normal.  Nose: Nose normal.  Mouth/Throat: Oropharynx is clear and moist. No oropharyngeal exudate.  Eyes: Conjunctivae are normal. Pupils are equal, round, and reactive to light. Right eye exhibits no discharge. Left eye exhibits no discharge. No scleral icterus.  Neck: Normal range of motion. Neck supple. No thyromegaly present.  Cardiovascular: Normal rate, regular rhythm, normal heart sounds and intact distal pulses.   No murmur heard. Pulmonary/Chest: Effort normal and breath sounds normal. No respiratory distress. She has no wheezes. She has no rales.  Abdominal: Soft. Bowel sounds are normal. She exhibits no distension and no mass. There is no tenderness.  Genitourinary: Vagina normal, uterus normal,  cervix normal, right adnexa normal and left adnexa normal. No vaginal discharge found.  Breast exam without any lesions, discharge or skin changes  Musculoskeletal: Normal range of motion. She exhibits no edema and no tenderness.  Lymphadenopathy:    She has no cervical adenopathy.  Neurological: She is alert and oriented to person, place, and time. She has normal reflexes. No cranial nerve deficit. Coordination normal.  Skin: Skin is warm and dry. No rash noted. She is not diaphoretic.  Psychiatric: Mood, memory and affect normal.    Lab Results  Component Value Date   TSH 0.29* 04/01/2012  Lab Results  Component Value Date   WBC 5.7 04/01/2012   HGB 11.2* 04/01/2012   HCT 33.6* 04/01/2012   MCV 82.8 04/01/2012   PLT 262.0 04/01/2012   Lab Results  Component Value Date   CREATININE 1.2 04/01/2012   BUN 25* 04/01/2012   NA 140 04/01/2012   K 4.6 04/01/2012   CL 102 04/01/2012   CO2 34* 04/01/2012   Lab Results  Component Value Date   ALT 27 04/01/2012   AST 35 04/01/2012   ALKPHOS 59 04/01/2012   BILITOT 0.6 04/01/2012   Lab Results  Component Value Date   CHOL 178 04/01/2012   Lab Results  Component Value Date   HDL 78.10 04/01/2012   Lab Results  Component Value Date   LDLCALC 83 04/01/2012   Lab Results  Component Value Date   TRIG 86.0 04/01/2012   Lab Results  Component Value Date   CHOLHDL 2 04/01/2012     Assessment & Plan  Hypertension Well controlled on current meds, no changes  Preventative health care Given Tdap today encouraged DASH diet and increased activity as tolerated  Multiple chemical sensitivity syndrome Suffered a toxic exposure years ago and continues to be very sensitive to chemicals in her environment, feels she has been left with some residual right sided weakness as a result.   Diarrhea Notes some intermittent explosive diarrea and increased gaseousness s/p a course of Amoxicilin followed by Clindamycin by her dentist. Encouraged a  probiotic daily, check stool for CDiff and if negative may use Imodium prn for an up coming trip. If she continues to have trouble upon her return may need referral to GI. Given an IFOB kit to complete as well.

## 2012-06-12 NOTE — Assessment & Plan Note (Addendum)
Suffered a toxic exposure years ago and continues to be very sensitive to chemicals in her environment, feels she has been left with some residual right sided weakness as a result.

## 2012-06-12 NOTE — Assessment & Plan Note (Signed)
Given Tdap today encouraged DASH diet and increased activity as tolerated

## 2012-06-13 ENCOUNTER — Encounter: Payer: Self-pay | Admitting: Internal Medicine

## 2012-06-17 ENCOUNTER — Other Ambulatory Visit (INDEPENDENT_AMBULATORY_CARE_PROVIDER_SITE_OTHER): Payer: Medicare Other

## 2012-06-17 ENCOUNTER — Telehealth: Payer: Self-pay | Admitting: *Deleted

## 2012-06-17 DIAGNOSIS — R197 Diarrhea, unspecified: Secondary | ICD-10-CM

## 2012-06-17 NOTE — Telephone Encounter (Signed)
Received call from [facility] requesting order for IFOB on pt. Order entered per 06/10/12 office note.

## 2012-06-22 NOTE — Progress Notes (Signed)
Quick Note:  Patient Informed and voiced understanding.  Pt states she has an appt on June 5, 14 at 10 am with Dr Carlean Purl ______

## 2012-07-07 ENCOUNTER — Encounter: Payer: Self-pay | Admitting: Internal Medicine

## 2012-07-07 ENCOUNTER — Other Ambulatory Visit (INDEPENDENT_AMBULATORY_CARE_PROVIDER_SITE_OTHER): Payer: Medicare Other

## 2012-07-07 ENCOUNTER — Ambulatory Visit (INDEPENDENT_AMBULATORY_CARE_PROVIDER_SITE_OTHER): Payer: Medicare Other | Admitting: Internal Medicine

## 2012-07-07 VITALS — BP 172/76 | HR 64 | Ht 66.25 in | Wt 168.4 lb

## 2012-07-07 DIAGNOSIS — R7989 Other specified abnormal findings of blood chemistry: Secondary | ICD-10-CM

## 2012-07-07 DIAGNOSIS — R197 Diarrhea, unspecified: Secondary | ICD-10-CM

## 2012-07-07 DIAGNOSIS — R1310 Dysphagia, unspecified: Secondary | ICD-10-CM

## 2012-07-07 DIAGNOSIS — R195 Other fecal abnormalities: Secondary | ICD-10-CM

## 2012-07-07 DIAGNOSIS — R6889 Other general symptoms and signs: Secondary | ICD-10-CM

## 2012-07-07 LAB — CBC WITH DIFFERENTIAL/PLATELET
Basophils Relative: 1.3 % (ref 0.0–3.0)
Eosinophils Relative: 2 % (ref 0.0–5.0)
Monocytes Relative: 9.9 % (ref 3.0–12.0)
Neutrophils Relative %: 54.4 % (ref 43.0–77.0)
Platelets: 205 10*3/uL (ref 150.0–400.0)
RBC: 4.01 Mil/uL (ref 3.87–5.11)
WBC: 5.6 10*3/uL (ref 4.5–10.5)

## 2012-07-07 LAB — T4, FREE: Free T4: 1.21 ng/dL (ref 0.60–1.60)

## 2012-07-07 LAB — TSH: TSH: 0.17 u[IU]/mL — ABNORMAL LOW (ref 0.35–5.50)

## 2012-07-07 MED ORDER — NA SULFATE-K SULFATE-MG SULF 17.5-3.13-1.6 GM/177ML PO SOLN: ORAL | Status: DC

## 2012-07-07 NOTE — Patient Instructions (Addendum)
You have been scheduled for an endoscopy and colonoscopy with propofol. Please follow the written instructions given to you at your visit today. Please pick up your prep at the pharmacy within the next 1-3 days. If you use inhalers (even only as needed), please bring them with you on the day of your procedure. Your physician has requested that you go to www.startemmi.com and enter the access code given to you at your visit today. This web site gives a general overview about your procedure. However, you should still follow specific instructions given to you by our office regarding your preparation for the procedure.  Your physician has requested that you go to the basement for the following lab work before leaving today: TSH, CBC, Free T4, C-diff  I appreciate the opportunity to care for you.

## 2012-07-07 NOTE — Assessment & Plan Note (Addendum)
Her current problems with diarrhea could be from clindamycin, and alteration of her bowel flora, though I don't think we've excluded C. difficile yet. Hyperthyroidism could do this, as well. She could have some sort of microscopic colitis, colorectal neoplasia as possible but much less likely as a cause. Plans for investigation with a colonoscopy because of these problems and her heme positive stool.The risks and benefits as well as alternatives of endoscopic procedure(s) have been discussed and reviewed. All questions answered. The patient agrees to proceed.

## 2012-07-07 NOTE — Assessment & Plan Note (Signed)
Borderline microcytic has been on iron for some time, we'll recheck CBC today. Think this could also be anemia of chronic disease.

## 2012-07-07 NOTE — Progress Notes (Signed)
Subjective:    Patient ID: Jillian Hayes, female    DOB: April 22, 1945, 67 y.o.   MRN: [id number]  HPI This very nice lady presents for evaluation of diarrhea. Several months ago she had dental work and an infection problem and was on amoxicillin and clindamycin then developed explosive diarrhea that was fairly watery. She took yogurt, probiotic and fiber and improved but she continues to have multiple stools throughout the day and occasionally at night and they're loose. She still has what she described as excessive gas. She saw Dr. Charlett Blake roughly one month ago and the Hemoccult test came back positive, I think this is an immune-based test, and she was supposed to have a repeat thyroid testing CBC and a C. difficile by PCR performed. I'm not sure the blood tests were meant to be done at that time but the C. difficile was but it sounds like she never had collection device we don't have a result. She has known hemorrhoids and occasionally has seen bright red blood with wiping. She had a sigmoidoscopy years ago by her former primary care physician. She has never had a colonoscopy.  She is also describing intermittent solid food dysphagia for a year or more. It occurs about weekly. Things such as rice or perhaps raw carrots. In the midsternal sticking point. She will drink more and they will pass. She notices heartburn or reflux if she bends over at times, or if she feels like she drinks too much fluid with a meal. She is not on any medication for reflux. In the symptoms are intermittent no persistent. There is no weight loss.  Allergies  Allergen Reactions  . Sulfa Antibiotics Nausea And Vomiting   Outpatient Prescriptions Prior to Visit  Medication Sig Dispense Refill  . ALFALFA PO Take 4,500 mg by mouth daily.        Marland Kitchen amLODipine (NORVASC) 5 MG tablet Take 1 tablet (5 mg total) by mouth daily.  90 tablet  1  . atenolol (TENORMIN) 25 MG tablet Take 1 tablet (25 mg total) by mouth daily.  90 tablet  1   . b complex vitamins capsule Take 1 capsule by mouth daily.      . Calcium Carbonate-Vitamin D (CALCIUM + D PO) Take 1,000 mg by mouth daily.        . cholecalciferol (VITAMIN D) 1000 UNITS tablet Take 1,000 Units by mouth daily.        . Ferrous Fumarate (IRON) 18 MG TBCR Take 1 tablet by mouth daily.        . Flaxseed, Linseed, (FLAX SEED OIL PO) Take 1 capsule by mouth 3 (three) times daily.      . L-GLUTAMINE PO Take 1 capsule by mouth daily.      Marland Kitchen lisinopril (PRINIVIL,ZESTRIL) 20 MG tablet Take 1 tablet (20 mg total) by mouth 2 (two) times daily.  180 tablet  1  . vitamin C (ASCORBIC ACID) 500 MG tablet Take 500 mg by mouth as needed.        Marland Kitchen aspirin EC 81 MG tablet Take 81 mg by mouth daily.       No facility-administered medications prior to visit.   Past Medical History  Diagnosis Date  . Allergy     seasonal  . Anemia     iron deficiency  . Depression 1991    hospitalized  . Anxiety   . Hypertension   . Insomnia 06/24/2010  . Overweight(278.02) 06/24/2010  . Fatigue 06/24/2010  . Chronic  headaches 06/24/2010  . Multiple chemical sensitivity syndrome 06/25/2010  . History of chicken pox 06/25/2010  . Bladder polyps 06/25/2010  . Renal insufficiency 03/26/2011  . Preventative health care 11/09/2011  . Diarrhea 06/12/2012   Past Surgical History  Procedure Laterality Date  . Polyps on bladder removed  1972    benign  . Cyst on left breast removed Left 1991    benign  . Nasal septum surgery  1986    rhinoplasty  . Tonsillectomy  1962   History   Social History  . Marital Status: Married    Spouse Name: N/A    Number of Children: 2  . Years of Education: N/A   Occupational History  . retired    Social History Main Topics  . Smoking status: Never Smoker   . Smokeless tobacco: Never Used  . Alcohol Use: No  . Drug Use: No  . Sexually Active: Yes -- Female partner(s)    Family History  Problem Relation Age of Onset  . Breast cancer Mother 71    left breast  removed, 2010 lung  . Nephrolithiasis Father   . Heart attack Father 75    X 3  . Heart disease Father     smoker  . Hypertension Brother   . Melanoma Son 37    melanoma on leg removed  . Pancreatic cancer Maternal Grandmother   . Hypertension Paternal Grandmother   . Obesity Paternal Grandmother   . Diabetes Mother   . Diabetes Maternal Aunt     x 2  . Diabetes Maternal Uncle     x 3         Review of Systems Positive for those things mentioned in the history of present illness. She does wear eyeglasses. All other review of systems are negative.    Objective:   Physical Exam General:  Well-developed, well-nourished and in no acute distress Eyes:  anicteric. ENT:   Mouth and posterior pharynx free of lesions.  Neck:   supple w/o thyromegaly or mass.  Lungs: Clear to auscultation bilaterally. Heart:  S1S2, no rubs, murmurs, gallops. Abdomen:  soft, non-tender, no hepatosplenomegaly, hernia, or mass and BS+.  Rectal: Deferred until colonoscopy Lymph:  no cervical or supraclavicular adenopathy. Extremities:   no edema Skin   no rash. Neuro:  A&O x 3.  Psych:  appropriate mood and  Affect.   Data Reviewed: Lab Results  Component Value Date   WBC 5.7 04/01/2012   HGB 11.2* 04/01/2012   HCT 33.6* 04/01/2012   MCV 82.8 04/01/2012   PLT 262.0 04/01/2012   I've also reviewed TSH of 0.29 in February with a normal free T4 at that time.    Assessment & Plan:   1. Diarrhea   2. Heme positive stool   3. Dysphagia, unspecified(787.20)   4. Abnormal TSH    Please see problem oriented charting. Plan for EGD and colonoscopy. Possible esophageal dilation. Go ahead and get the C. difficile test and thyroid studies and repeat a CBC.  I appreciate the opportunity to care for this patient. [id number], Erline Levine, MD

## 2012-07-08 ENCOUNTER — Other Ambulatory Visit: Payer: Medicare Other

## 2012-07-08 DIAGNOSIS — R197 Diarrhea, unspecified: Secondary | ICD-10-CM

## 2012-07-11 ENCOUNTER — Other Ambulatory Visit: Payer: Self-pay

## 2012-07-11 DIAGNOSIS — I10 Essential (primary) hypertension: Secondary | ICD-10-CM

## 2012-07-11 MED ORDER — LISINOPRIL 20 MG PO TABS: 20.0000 mg | ORAL_TABLET | Freq: Two times a day (BID) | ORAL | Status: DC

## 2012-07-11 MED ORDER — ATENOLOL 25 MG PO TABS: 25.0000 mg | ORAL_TABLET | Freq: Every day | ORAL | Status: DC

## 2012-07-11 NOTE — Progress Notes (Signed)
Quick Note:  Let her know that the tests show 1) stable Hgb 2) Lower TSH which can indicate hyperthyroid but free T4 is ok 3) she does not have a C diff infection  I am routing this to her PCP Dr. Charlett Blake also re: the lower TSH and what this means - sometimes it can be low but not a sign of overactive thyroid and the normal free T4 supports this ______

## 2012-07-12 ENCOUNTER — Other Ambulatory Visit: Payer: Self-pay | Admitting: *Deleted

## 2012-07-12 DIAGNOSIS — R7989 Other specified abnormal findings of blood chemistry: Secondary | ICD-10-CM

## 2012-08-01 ENCOUNTER — Telehealth: Payer: Self-pay | Admitting: Internal Medicine

## 2012-08-01 NOTE — Telephone Encounter (Signed)
Spoke to Dr. Carlean Purl and told her he said if she prefers she may hold her iron until her procedure is done 08/04/12.  He said they thought in the past that iron could stain the colon but he has not seen this.

## 2012-08-01 NOTE — Telephone Encounter (Signed)
Patient had questions regarding eating peaches and taking her iron and advil this week .  Told her that would be ok as Dr. Carlean Purl did not request her to hold those.

## 2012-08-04 ENCOUNTER — Encounter: Payer: Self-pay | Admitting: Internal Medicine

## 2012-08-04 ENCOUNTER — Ambulatory Visit (AMBULATORY_SURGERY_CENTER): Payer: Medicare Other | Admitting: Internal Medicine

## 2012-08-04 VITALS — BP 135/59 | HR 50 | Temp 98.0°F | Resp 20 | Ht 66.0 in | Wt 168.0 lb

## 2012-08-04 DIAGNOSIS — R197 Diarrhea, unspecified: Secondary | ICD-10-CM

## 2012-08-04 DIAGNOSIS — D131 Benign neoplasm of stomach: Secondary | ICD-10-CM

## 2012-08-04 DIAGNOSIS — K5730 Diverticulosis of large intestine without perforation or abscess without bleeding: Secondary | ICD-10-CM

## 2012-08-04 DIAGNOSIS — R1310 Dysphagia, unspecified: Secondary | ICD-10-CM

## 2012-08-04 DIAGNOSIS — D126 Benign neoplasm of colon, unspecified: Secondary | ICD-10-CM

## 2012-08-04 DIAGNOSIS — R195 Other fecal abnormalities: Secondary | ICD-10-CM

## 2012-08-04 MED ORDER — SODIUM CHLORIDE 0.9 % IV SOLN: 500.0000 mL | INTRAVENOUS | Status: DC

## 2012-08-04 NOTE — Patient Instructions (Addendum)
There were some small polyps in the stomach - I took biopsies of them. They look like benign polyps that are not pre-cancerous.   I dilated the esophagus to help your swallowing - it looked slightly narrow at the junction of the esophagus and stomach.  The colon was ok except for diverticulosis - not usually a problem. I did take biopsies of the colon to see if there is a cause of diarrhea only visibly by microscope.  My office will call with results and plans.  I appreciate the opportunity to care for you. Gatha Mayer, MD, [facility]  Discharge instructions given with verbal understanding. Biopsies taken. Handout on a dilatation diet. Resume previous medications. YOU HAD AN ENDOSCOPIC PROCEDURE TODAY AT [facility]: Refer to the procedure report that was given to you for any specific questions about what was found during the examination.  If the procedure report does not answer your questions, please call your gastroenterologist to clarify.  If you requested that your care partner not be given the details of your procedure findings, then the procedure report has been included in a sealed envelope for you to review at your convenience later.  YOU SHOULD EXPECT: Some feelings of bloating in the abdomen. Passage of more gas than usual.  Walking can help get rid of the air that was put into your GI tract during the procedure and reduce the bloating. If you had a lower endoscopy (such as a colonoscopy or flexible sigmoidoscopy) you may notice spotting of blood in your stool or on the toilet paper. If you underwent a bowel prep for your procedure, then you may not have a normal bowel movement for a few days.  DIET: Your first meal following the procedure should be a light meal and then it is ok to progress to your normal diet.  A half-sandwich or bowl of soup is an example of a good first meal.  Heavy or fried foods are harder to digest and may make you feel nauseous or bloated.   Likewise meals heavy in dairy and vegetables can cause extra gas to form and this can also increase the bloating.  Drink plenty of fluids but you should avoid alcoholic beverages for 24 hours.  ACTIVITY: Your care partner should take you home directly after the procedure.  You should plan to take it easy, moving slowly for the rest of the day.  You can resume normal activity the day after the procedure however you should NOT DRIVE or use heavy machinery for 24 hours (because of the sedation medicines used during the test).    SYMPTOMS TO REPORT IMMEDIATELY: A gastroenterologist can be reached at any hour.  During normal business hours, 8:30 AM to 5:00 PM Monday through Friday, call [phone].  After hours and on weekends, please call the GI answering service at [phone] who will take a message and have the physician on call contact you.   Following lower endoscopy (colonoscopy or flexible sigmoidoscopy):  Excessive amounts of blood in the stool  Significant tenderness or worsening of abdominal pains  Swelling of the abdomen that is new, acute  Fever of 100F or higher  Following upper endoscopy (EGD)  Vomiting of blood or coffee ground material  New chest pain or pain under the shoulder blades  Painful or persistently difficult swallowing  New shortness of breath  Fever of 100F or higher  Black, tarry-looking stools  FOLLOW UP: If any biopsies were taken you will be contacted  by phone or by letter within the next 1-3 weeks.  Call your gastroenterologist if you have not heard about the biopsies in 3 weeks.  Our staff will call the home number listed on your records the next business day following your procedure to check on you and address any questions or concerns that you may have at that time regarding the information given to you following your procedure. This is a courtesy call and so if there is no answer at the home number and we have not heard from you through the emergency  physician on call, we will assume that you have returned to your regular daily activities without incident.  SIGNATURES/CONFIDENTIALITY: You and/or your care partner have signed paperwork which will be entered into your electronic medical record.  These signatures attest to the fact that that the information above on your After Visit Summary has been reviewed and is understood.  Full responsibility of the confidentiality of this discharge information lies with you and/or your care-partner.

## 2012-08-04 NOTE — Progress Notes (Signed)
Called to room to assist during endoscopic procedure.  Patient ID and intended procedure confirmed with present staff. Received instructions for my participation in the procedure from the performing physician.  

## 2012-08-04 NOTE — Progress Notes (Signed)
Patient did not experience any of the following events: a burn prior to discharge; a fall within the facility; wrong site/side/patient/procedure/implant event; or a hospital transfer or hospital admission upon discharge from the facility. (G8907) Patient did not have preoperative order for IV antibiotic SSI prophylaxis. (G8918)  

## 2012-08-04 NOTE — Progress Notes (Signed)
Procedure ends, to recovery, report given and VSS. 

## 2012-08-04 NOTE — Op Note (Signed)
[place]  [address] [place], [address]   COLONOSCOPY PROCEDURE REPORT  PATIENT: Jillian, Hayes  MR#: [id number] BIRTHDATE: 1945-05-08 , 66  yrs. old GENDER: Female ENDOSCOPIST: Gatha Mayer, MD, [facility] PROCEDURE DATE:  08/04/2012 PROCEDURE:   Colonoscopy with biopsy ASA CLASS:   Class II INDICATIONS:unexplained diarrhea and heme-positive stool. MEDICATIONS: There was residual sedation effect present from prior procedure, Propofol (Diprivan) 140 mg IV, MAC sedation, administered by CRNA, and These medications were titrated to patient response per physician's verbal order  DESCRIPTION OF PROCEDURE:   After the risks benefits and alternatives of the procedure were thoroughly explained, informed consent was obtained.  A digital rectal exam revealed no abnormalities of the rectum.   The LB [id number]  endoscope was introduced through the anus and advanced to the cecum, which was identified by both the appendix and ileocecal valve. No adverse events experienced.   The quality of the prep was excellent using Suprep  The instrument was then slowly withdrawn as the colon was fully examined.      COLON FINDINGS: Moderate diverticulosis was noted in the sigmoid colon.   The colon mucosa was otherwise normal.  Retroflexed views revealed internal hemorrhoids. The time to cecum=3 minutes 06 seconds.  Withdrawal time=10 minutes 01 seconds.  The scope was withdrawn and the procedure completed. COMPLICATIONS: There were no complications.  ENDOSCOPIC IMPRESSION: 1.   Moderate diverticulosis was noted in the sigmoid colon 2.   The colon mucosa was otherwise normal 3. Internal hemorrhoids in rectum - probable cause of Heme + stool  RECOMMENDATIONS: 1.  Await pathology results 2.  Office will call with the results.   eSigned:  Gatha Mayer, MD, [facility] 08/04/2012 2:23 PM   cc: Willette Alma, MD and The Patient

## 2012-08-04 NOTE — Op Note (Signed)
[place]  [address] [place], [id number]   ENDOSCOPY PROCEDURE REPORT  PATIENT: Jillian Hayes, Jillian Hayes  MR#: [id number] BIRTHDATE: [birth year] , 66  yrs. old GENDER: Female ENDOSCOPIST: Gatha Mayer, MD, [facility] PROCEDURE DATE:  08/04/2012 PROCEDURE:  EGD w/ biopsy and Maloney dilation of esophagus ASA CLASS:     Class II INDICATIONS:  Dysphagia. MEDICATIONS: propofol (Diprivan) 100mg  IV, MAC sedation, administered by CRNA, and These medications were titrated to patient response per physician's verbal order TOPICAL ANESTHETIC: Cetacaine Spray  DESCRIPTION OF PROCEDURE: After the risks benefits and alternatives of the procedure were thoroughly explained, informed consent was obtained.  The LB [id number] endoscope was introduced through the mouth and advanced to the second portion of the duodenum. Without limitations.  The instrument was slowly withdrawn as the mucosa was fully examined.        STOMACH: Multiple diminutive sessile polyps were found in the gastric body.  Multiple biopsies was performed using cold forceps. Sample sent for histology.   The esophagus showed some mild stenosis at the GE junction.  The remainder of the upper endoscopy exam was otherwise normal. Retroflexed views revealed no abnormalities.     The scope was then withdrawn from the patient, a 30 Fr Maloney dilator easily passed without heme,  and the procedure completed.  COMPLICATIONS: There were no complications. ENDOSCOPIC IMPRESSION: 1.   Multiple diminutive sessile polyps were found in the gastric body and mild GE junction stenosis - dilated 54 Fr 2.   The remainder of the upper endoscopy exam was otherwise normal  RECOMMENDATIONS: 1.  Clear liquids until 3 PM, then soft foods rest of day.  Resume prior diet tomorrow. 2.   Colonoscopy next    eSigned:  Gatha Mayer, MD, [facility] 08/04/2012 2:20 PM   [id number] Charlett Blake, MD and The Patient

## 2012-08-08 ENCOUNTER — Telehealth: Payer: Self-pay

## 2012-08-08 NOTE — Telephone Encounter (Signed)
  Follow up Call-  Call back number 08/04/2012  Post procedure Call Back phone  # [phone]  Permission to leave phone message Yes     Patient questions:  Do you have a fever, pain , or abdominal swelling? no Pain Score  0 *  Have you tolerated food without any problems? yes  Have you been able to return to your normal activities? yes  Do you have any questions about your discharge instructions: Diet   no Medications  no Follow up visit  no  Do you have questions or concerns about your Care? no  Actions: * If pain score is 4 or above: No action needed, pain <4.

## 2012-08-11 ENCOUNTER — Encounter: Payer: Self-pay | Admitting: Internal Medicine

## 2012-11-23 ENCOUNTER — Other Ambulatory Visit: Payer: Self-pay | Admitting: *Deleted

## 2012-11-23 MED ORDER — AMLODIPINE BESYLATE 5 MG PO TABS: 5.0000 mg | ORAL_TABLET | Freq: Every day | ORAL | Status: DC

## 2012-11-23 NOTE — Telephone Encounter (Signed)
Rx request to pharmacy/SLS  

## 2013-01-12 ENCOUNTER — Telehealth: Payer: Self-pay | Admitting: Family Medicine

## 2013-01-12 DIAGNOSIS — I10 Essential (primary) hypertension: Secondary | ICD-10-CM

## 2013-01-12 NOTE — Telephone Encounter (Signed)
Lisinopril tablet 20 mg take 1 by mouth twice daily qty 180 Atenolol tablet 25mg  take 1 by mouth daily qty 90

## 2013-01-13 MED ORDER — LISINOPRIL 20 MG PO TABS: 20.0000 mg | ORAL_TABLET | Freq: Two times a day (BID) | ORAL | Status: DC

## 2013-01-13 MED ORDER — ATENOLOL 25 MG PO TABS: 25.0000 mg | ORAL_TABLET | Freq: Every day | ORAL | Status: DC

## 2013-04-17 ENCOUNTER — Telehealth: Payer: Self-pay | Admitting: Family Medicine

## 2013-04-17 DIAGNOSIS — I10 Essential (primary) hypertension: Secondary | ICD-10-CM

## 2013-04-17 MED ORDER — LISINOPRIL 20 MG PO TABS: 20.0000 mg | ORAL_TABLET | Freq: Two times a day (BID) | ORAL | Status: DC

## 2013-04-17 MED ORDER — ATENOLOL 25 MG PO TABS: 25.0000 mg | ORAL_TABLET | Freq: Every day | ORAL | Status: DC

## 2013-04-17 NOTE — Telephone Encounter (Signed)
Refill-atenolol °

## 2013-04-17 NOTE — Telephone Encounter (Signed)
Refill- lisinopril    90 day supply

## 2013-04-17 NOTE — Telephone Encounter (Signed)
Please inform patient that a 90 day supply of each was sent but patient needs to be seen for any additional medication to be sent

## 2013-04-19 NOTE — Telephone Encounter (Signed)
Left detailed message informing patient of medication refill and to call our office to schedule appointment °

## 2013-04-24 NOTE — Telephone Encounter (Signed)
Left message for patient to return my call.

## 2013-04-25 NOTE — Telephone Encounter (Signed)
Left message for patient to return my call.

## 2013-04-25 NOTE — Telephone Encounter (Signed)
Mailed letter to pt

## 2013-05-11 ENCOUNTER — Telehealth: Payer: Self-pay | Admitting: Family Medicine

## 2013-05-11 NOTE — Telephone Encounter (Signed)
Patient has a claim form to complete and resubmit with her insurance company to have her TDAP shot covered, she received the injection 06/10/12 and she needs to know the brand name and the [place] number.  Patient is aware Alyse Low is out of town and is expecting call back next week

## 2013-05-16 NOTE — Telephone Encounter (Signed)
Patient given information needed.

## 2013-05-25 ENCOUNTER — Other Ambulatory Visit: Payer: Self-pay

## 2013-05-25 MED ORDER — AMLODIPINE BESYLATE 5 MG PO TABS: 5.0000 mg | ORAL_TABLET | Freq: Every day | ORAL | Status: DC

## 2013-06-20 ENCOUNTER — Encounter: Payer: Self-pay | Admitting: Family Medicine

## 2013-06-20 ENCOUNTER — Ambulatory Visit (INDEPENDENT_AMBULATORY_CARE_PROVIDER_SITE_OTHER): Payer: Medicare Other | Admitting: Family Medicine

## 2013-06-20 VITALS — BP 138/76 | HR 62 | Temp 97.8°F | Ht 66.75 in | Wt 167.1 lb

## 2013-06-20 DIAGNOSIS — R946 Abnormal results of thyroid function studies: Secondary | ICD-10-CM

## 2013-06-20 DIAGNOSIS — E663 Overweight: Secondary | ICD-10-CM

## 2013-06-20 DIAGNOSIS — T7840XA Allergy, unspecified, initial encounter: Secondary | ICD-10-CM

## 2013-06-20 DIAGNOSIS — I10 Essential (primary) hypertension: Secondary | ICD-10-CM

## 2013-06-20 MED ORDER — AMLODIPINE BESYLATE 5 MG PO TABS: 5.0000 mg | ORAL_TABLET | Freq: Every day | ORAL | Status: DC

## 2013-06-20 MED ORDER — LISINOPRIL 20 MG PO TABS: 20.0000 mg | ORAL_TABLET | Freq: Two times a day (BID) | ORAL | Status: DC

## 2013-06-20 NOTE — Progress Notes (Signed)
Pre visit review using our clinic review tool, if applicable. No additional management support is needed unless otherwise documented below in the visit note. 

## 2013-06-20 NOTE — Patient Instructions (Signed)
Try Melatonin 2 mg at bed and can increase to 5 and even 10 mg nightly for sleep as needed   Insomnia Insomnia is frequent trouble falling and/or staying asleep. Insomnia can be a long term problem or a short term problem. Both are common. Insomnia can be a short term problem when the wakefulness is related to a certain stress or worry. Long term insomnia is often related to ongoing stress during waking hours and/or poor sleeping habits. Overtime, sleep deprivation itself can make the problem worse. Every little thing feels more severe because you are overtired and your ability to cope is decreased. CAUSES   Stress, anxiety, and depression.  Poor sleeping habits.  Distractions such as TV in the bedroom.  Naps close to bedtime.  Engaging in emotionally charged conversations before bed.  Technical reading before sleep.  Alcohol and other sedatives. They may make the problem worse. They can hurt normal sleep patterns and normal dream activity.  Stimulants such as caffeine for several hours prior to bedtime.  Pain syndromes and shortness of breath can cause insomnia.  Exercise late at night.  Changing time zones may cause sleeping problems (jet lag). It is sometimes helpful to have someone observe your sleeping patterns. They should look for periods of not breathing during the night (sleep apnea). They should also look to see how long those periods last. If you live alone or observers are uncertain, you can also be observed at a sleep clinic where your sleep patterns will be professionally monitored. Sleep apnea requires a checkup and treatment. Give your caregivers your medical history. Give your caregivers observations your family has made about your sleep.  SYMPTOMS   Not feeling rested in the morning.  Anxiety and restlessness at bedtime.  Difficulty falling and staying asleep. TREATMENT   Your caregiver may prescribe treatment for an underlying medical disorders. Your caregiver  can give advice or help if you are using alcohol or other drugs for self-medication. Treatment of underlying problems will usually eliminate insomnia problems.  Medications can be prescribed for short time use. They are generally not recommended for lengthy use.  Over-the-counter sleep medicines are not recommended for lengthy use. They can be habit forming.  You can promote easier sleeping by making lifestyle changes such as:  Using relaxation techniques that help with breathing and reduce muscle tension.  Exercising earlier in the day.  Changing your diet and the time of your last meal. No night time snacks.  Establish a regular time to go to bed.  Counseling can help with stressful problems and worry.  Soothing music and white noise may be helpful if there are background noises you cannot remove.  Stop tedious detailed work at least one hour before bedtime. HOME CARE INSTRUCTIONS   Keep a diary. Inform your caregiver about your progress. This includes any medication side effects. See your caregiver regularly. Take note of:  Times when you are asleep.  Times when you are awake during the night.  The quality of your sleep.  How you feel the next day. This information will help your caregiver care for you.  Get out of bed if you are still awake after 15 minutes. Read or do some quiet activity. Keep the lights down. Wait until you feel sleepy and go back to bed.  Keep regular sleeping and waking hours. Avoid naps.  Exercise regularly.  Avoid distractions at bedtime. Distractions include watching television or engaging in any intense or detailed activity like attempting to balance the  household checkbook.  Develop a bedtime ritual. Keep a familiar routine of bathing, brushing your teeth, climbing into bed at the same time each night, listening to soothing music. Routines increase the success of falling to sleep faster.  Use relaxation techniques. This can be using breathing  and muscle tension release routines. It can also include visualizing peaceful scenes. You can also help control troubling or intruding thoughts by keeping your mind occupied with boring or repetitive thoughts like the old concept of counting sheep. You can make it more creative like imagining planting one beautiful flower after another in your backyard garden.  During your day, work to eliminate stress. When this is not possible use some of the previous suggestions to help reduce the anxiety that accompanies stressful situations. MAKE SURE YOU:   Understand these instructions.  Will watch your condition.  Will get help right away if you are not doing well or get worse. Document Released: 01/17/2000 Document Revised: 04/13/2011 Document Reviewed: 02/16/2007 [employer] Patient Information 2014 [employer].

## 2013-06-21 LAB — TSH: TSH: 0.339 u[IU]/mL — AB (ref 0.350–4.500)

## 2013-06-21 LAB — T4, FREE: FREE T4: 1.09 ng/dL (ref 0.80–1.80)

## 2013-06-25 ENCOUNTER — Encounter: Payer: Self-pay | Admitting: Family Medicine

## 2013-06-25 DIAGNOSIS — R946 Abnormal results of thyroid function studies: Secondary | ICD-10-CM | POA: Insufficient documentation

## 2013-06-25 NOTE — Assessment & Plan Note (Signed)
Did not take meds prior to visit and was better on recheck. Poorly controlled initially encouraged DASH diet, minimize caffeine and obtain adequate sleep. Report concerning symptoms and follow up as directed and as needed

## 2013-06-25 NOTE — Progress Notes (Signed)
Patient ID: Jillian Hayes, female   DOB: [birth year], 68 y.o.   MRN: [id number] Jillian Hayes [id number] January 07, 1946 06/25/2013      Progress Note-Follow Up  Subjective  Chief Complaint  Chief Complaint  Patient presents with  . Medication Refill    HPI  Patient is a 68 year old female in today for routine medical care. In today for refills. Did not take all of her medications prior to visit. Her greatest acute complaint is of allergies and congestion. She is noting some increased cough most notably at bedtime with some mucus production. Clear. No fevers or other signs of recent illness. Denies CP/palp/SOB/HA/congestion/fevers/GI or GU c/o. Taking meds as prescribed  Past Medical History  Diagnosis Date  . Allergy     seasonal  . Anemia     iron deficiency  . Depression 1991    hospitalized  . Anxiety   . Hypertension   . Insomnia 06/24/2010  . Overweight 06/24/2010  . Fatigue 06/24/2010  . Chronic headaches 06/24/2010  . Multiple chemical sensitivity syndrome 06/25/2010  . History of chicken pox 06/25/2010  . Bladder polyps 06/25/2010  . Renal insufficiency 03/26/2011  . Preventative health care 11/09/2011  . Diarrhea 06/12/2012  . Benign fundic gland polyps of stomach     Past Surgical History  Procedure Laterality Date  . Polyps on bladder removed  1972    benign  . Cyst on left breast removed Left 1991    benign  . Nasal septum surgery  1986    rhinoplasty  . Tonsillectomy  1962    Family History  Problem Relation Age of Onset  . Breast cancer Mother 93    left breast removed, 2010 lung  . Nephrolithiasis Father   . Heart attack Father 45    X 3  . Heart disease Father     smoker  . Hypertension Brother   . Melanoma Son 37    melanoma on leg removed  . Pancreatic cancer Maternal Grandmother   . Hypertension Paternal Grandmother   . Obesity Paternal Grandmother   . Diabetes Mother   . Diabetes Maternal Aunt     x 2  . Diabetes Maternal Uncle     x 3     History   Social History  . Marital Status: Married    Spouse Name: N/A    Number of Children: 2  . Years of Education: N/A   Occupational History  . retired    Social History Main Topics  . Smoking status: Never Smoker   . Smokeless tobacco: Never Used  . Alcohol Use: No  . Drug Use: No  . Sexual Activity: Yes    Partners: Male   Other Topics Concern  . Not on file   Social History Narrative  . No narrative on file    Current Outpatient Prescriptions on File Prior to Visit  Medication Sig Dispense Refill  . ALFALFA PO Take 4,500 mg by mouth daily.        Marland Kitchen atenolol (TENORMIN) 25 MG tablet Take 1 tablet (25 mg total) by mouth daily.  90 tablet  0  . b complex vitamins capsule Take 1 capsule by mouth daily.      . Calcium Carbonate-Vitamin D (CALCIUM + D PO) Take 1,000 mg by mouth daily.        . cholecalciferol (VITAMIN D) 1000 UNITS tablet Take 1,000 Units by mouth daily.        . Ferrous Fumarate (IRON)  18 MG TBCR Take 1 tablet by mouth daily.        . Fiber CHEW Chew 2 tablets by mouth 3 (three) times daily after meals.      . Flaxseed, Linseed, (FLAX SEED OIL PO) Take 1 capsule by mouth 3 (three) times daily.      . L-GLUTAMINE PO Take 1 capsule by mouth daily.      . Probiotic Product (PROBIOTIC DAILY PO) Take 1 tablet by mouth daily.      . vitamin C (ASCORBIC ACID) 500 MG tablet Take 500 mg by mouth as needed.         No current facility-administered medications on file prior to visit.    Allergies  Allergen Reactions  . Sulfa Antibiotics Nausea And Vomiting    Review of Systems  Review of Systems  Constitutional: Positive for malaise/fatigue. Negative for fever.  HENT: Positive for congestion.   Eyes: Negative for discharge.  Respiratory: Positive for cough and sputum production. Negative for shortness of breath.   Cardiovascular: Negative for chest pain, palpitations and leg swelling.  Gastrointestinal: Negative for nausea, abdominal pain and  diarrhea.  Genitourinary: Negative for dysuria.  Musculoskeletal: Negative for falls.  Skin: Negative for rash.  Neurological: Negative for loss of consciousness and headaches.  Endo/Heme/Allergies: Negative for polydipsia.  Psychiatric/Behavioral: Negative for depression and suicidal ideas. The patient is not nervous/anxious and does not have insomnia.     Objective  BP 182/76  Pulse 62  Temp(Src) 97.8 F (36.6 C) (Oral)  Ht 5' 6.75" (1.695 m)  Wt 167 lb 1.9 oz (75.805 kg)  BMI 26.39 kg/m2  SpO2 95%  Physical Exam  Physical Exam  Constitutional: She is oriented to person, place, and time and well-developed, well-nourished, and in no distress. No distress.  HENT:  Head: Normocephalic and atraumatic.  Eyes: Conjunctivae are normal.  Neck: Neck supple. No thyromegaly present.  Cardiovascular: Normal rate, regular rhythm and normal heart sounds.   No murmur heard. Pulmonary/Chest: Effort normal and breath sounds normal. She has no wheezes.  Abdominal: She exhibits no distension and no mass.  Musculoskeletal: She exhibits no edema.  Lymphadenopathy:    She has no cervical adenopathy.  Neurological: She is alert and oriented to person, place, and time.  Skin: Skin is warm and dry. No rash noted. She is not diaphoretic.  Psychiatric: Memory, affect and judgment normal.    Lab Results  Component Value Date   TSH 0.339* 06/20/2013   Lab Results  Component Value Date   WBC 5.6 07/07/2012   HGB 11.3* 07/07/2012   HCT 34.1* 07/07/2012   MCV 85.1 07/07/2012   PLT 205.0 07/07/2012   Lab Results  Component Value Date   CREATININE 1.2 04/01/2012   BUN 25* 04/01/2012   NA 140 04/01/2012   K 4.6 04/01/2012   CL 102 04/01/2012   CO2 34* 04/01/2012   Lab Results  Component Value Date   ALT 27 04/01/2012   AST 35 04/01/2012   ALKPHOS 59 04/01/2012   BILITOT 0.6 04/01/2012   Lab Results  Component Value Date   CHOL 178 04/01/2012   Lab Results  Component Value Date   HDL 78.10  04/01/2012   Lab Results  Component Value Date   LDLCALC 83 04/01/2012   Lab Results  Component Value Date   TRIG 86.0 04/01/2012   Lab Results  Component Value Date   CHOLHDL 2 04/01/2012     Assessment & Plan  Hypertension Did not  take meds prior to visit and was better on recheck. Poorly controlled initially encouraged DASH diet, minimize caffeine and obtain adequate sleep. Report concerning symptoms and follow up as directed and as needed  Overweight Encouraged DASH diet, decrease po intake and increase exercise as tolerated. Needs 7-8 hours of sleep nightly. Avoid trans fats, eat small, frequent meals every 4-5 hours with lean proteins, complex carbs and healthy fats. Minimize simple carbs, GMO foods.  Allergy Flared recently with increased cough. Encouraged nasal saline and otc antihistamines prn  Abnormal thyroid function test tsh improving and free T4 WNL will continue to monitor

## 2013-06-25 NOTE — Assessment & Plan Note (Signed)
Encouraged DASH diet, decrease po intake and increase exercise as tolerated. Needs 7-8 hours of sleep nightly. Avoid trans fats, eat small, frequent meals every 4-5 hours with lean proteins, complex carbs and healthy fats. Minimize simple carbs, GMO foods. 

## 2013-06-25 NOTE — Assessment & Plan Note (Signed)
tsh improving and free T4 WNL will continue to monitor

## 2013-06-25 NOTE — Assessment & Plan Note (Signed)
Flared recently with increased cough. Encouraged nasal saline and otc antihistamines prn

## 2013-06-29 ENCOUNTER — Encounter: Payer: Self-pay | Admitting: Physician Assistant

## 2013-06-29 ENCOUNTER — Ambulatory Visit (INDEPENDENT_AMBULATORY_CARE_PROVIDER_SITE_OTHER): Payer: Medicare Other | Admitting: Physician Assistant

## 2013-06-29 VITALS — BP 110/78 | HR 56 | Temp 98.1°F | Resp 14 | Ht 66.75 in | Wt 168.5 lb

## 2013-06-29 DIAGNOSIS — L309 Dermatitis, unspecified: Secondary | ICD-10-CM

## 2013-06-29 DIAGNOSIS — L259 Unspecified contact dermatitis, unspecified cause: Secondary | ICD-10-CM

## 2013-06-29 MED ORDER — TRIAMCINOLONE ACETONIDE 0.1 % EX OINT: 1.0000 "application " | TOPICAL_OINTMENT | Freq: Two times a day (BID) | CUTANEOUS | Status: DC

## 2013-06-29 NOTE — Progress Notes (Signed)
Pre visit review using our clinic review tool, if applicable. No additional management support is needed unless otherwise documented below in the visit note/SLS  

## 2013-06-29 NOTE — Patient Instructions (Signed)
Please use cream as directed.  Cool compresses will help with itch.  Topical Sarna lotion may also be beneficial.  Call or return to clinic if symptoms are not improving.  Avoid heat as it may worsen symptoms.

## 2013-06-30 DIAGNOSIS — L309 Dermatitis, unspecified: Secondary | ICD-10-CM | POA: Insufficient documentation

## 2013-06-30 NOTE — Progress Notes (Signed)
Patient presents to clinic today c/o an itchy rash of her face and neck that has been present for a couple of days.  Rash first noted after doing yard work.  Patient also notes the rash has spread with scratching.  Denies shortness of breath or difficulty swallowing.  Denies fever, chills, aches.  Has history significant for seasonal allergies.   Past Medical History  Diagnosis Date  . Allergy     seasonal  . Anemia     iron deficiency  . Depression 1991    hospitalized  . Anxiety   . Hypertension   . Insomnia 06/24/2010  . Overweight 06/24/2010  . Fatigue 06/24/2010  . Chronic headaches 06/24/2010  . Multiple chemical sensitivity syndrome 06/25/2010  . History of chicken pox 06/25/2010  . Bladder polyps 06/25/2010  . Renal insufficiency 03/26/2011  . Preventative health care 11/09/2011  . Diarrhea 06/12/2012  . Benign fundic gland polyps of stomach     Current Outpatient Prescriptions on File Prior to Visit  Medication Sig Dispense Refill  . ALFALFA PO Take 4,500 mg by mouth daily.        Marland Kitchen amLODipine (NORVASC) 5 MG tablet Take 1 tablet (5 mg total) by mouth daily.  90 tablet  3  . atenolol (TENORMIN) 25 MG tablet Take 1 tablet (25 mg total) by mouth daily.  90 tablet  0  . b complex vitamins capsule Take 1 capsule by mouth daily.      . Calcium Carbonate-Vitamin D (CALCIUM + D PO) Take 1,000 mg by mouth daily.        . cholecalciferol (VITAMIN D) 1000 UNITS tablet Take 1,000 Units by mouth daily.        . Ferrous Fumarate (IRON) 18 MG TBCR Take 1 tablet by mouth daily.        . Fiber CHEW Chew 2 tablets by mouth 3 (three) times daily after meals.      . Flaxseed, Linseed, (FLAX SEED OIL PO) Take 1 capsule by mouth 3 (three) times daily.      . L-GLUTAMINE PO Take 1 capsule by mouth daily.      Marland Kitchen lisinopril (PRINIVIL,ZESTRIL) 20 MG tablet Take 1 tablet (20 mg total) by mouth 2 (two) times daily.  180 tablet  3  . Probiotic Product (PROBIOTIC DAILY PO) Take 1 tablet by mouth daily.       . vitamin C (ASCORBIC ACID) 500 MG tablet Take 500 mg by mouth as needed.         No current facility-administered medications on file prior to visit.    Allergies  Allergen Reactions  . Sulfa Antibiotics Nausea And Vomiting    Family History  Problem Relation Age of Onset  . Breast cancer Mother 82    left breast removed, 2010 lung  . Nephrolithiasis Father   . Heart attack Father 26    X 3  . Heart disease Father     smoker  . Hypertension Brother   . Melanoma Son 37    melanoma on leg removed  . Pancreatic cancer Maternal Grandmother   . Hypertension Paternal Grandmother   . Obesity Paternal Grandmother   . Diabetes Mother   . Diabetes Maternal Aunt     x 2  . Diabetes Maternal Uncle     x 3    History   Social History  . Marital Status: Married    Spouse Name: N/A    Number of Children: 2  . Years of  Education: N/A   Occupational History  . retired    Social History Main Topics  . Smoking status: Never Smoker   . Smokeless tobacco: Never Used  . Alcohol Use: No  . Drug Use: No  . Sexual Activity: Yes    Partners: Male   Other Topics Concern  . None   Social History Narrative  . None   Review of Systems - See HPI.  All other ROS are negative.  BP 110/78  Pulse 56  Temp(Src) 98.1 F (36.7 C) (Oral)  Resp 14  Ht 5' 6.75" (1.695 m)  Wt 168 lb 8 oz (76.431 kg)  BMI 26.60 kg/m2  SpO2 98%  Physical Exam  Vitals reviewed. Constitutional: She is oriented to person, place, and time and well-developed, well-nourished, and in no distress.  HENT:  Head: Normocephalic and atraumatic.  Right Ear: External ear normal.  Left Ear: External ear normal.  Nose: Nose normal.  Mouth/Throat: Oropharynx is clear and moist. No oropharyngeal exudate.  Eyes: Conjunctivae are normal.  Neck: Neck supple.  Cardiovascular: Normal rate, regular rhythm, normal heart sounds and intact distal pulses.   Pulmonary/Chest: Effort normal and breath sounds normal. No  respiratory distress. She has no wheezes. She has no rales. She exhibits no tenderness.  Neurological: She is alert and oriented to person, place, and time.  Skin: Skin is warm and dry.  Presence of a mildly erythematous scattered maculopapular rash ofanterior/posterior neck, extending to face. Rash crosses the midline. No evidence of angioedema.  Psychiatric: Affect normal.    Recent Results (from the past 2160 hour(s))  TSH     Status: Abnormal   Collection Time    06/20/13  4:40 PM      Result Value Ref Range   TSH 0.339 (*) 0.350 - 4.500 uIU/mL  T4, FREE     Status: None   Collection Time    06/20/13  4:40 PM      Result Value Ref Range   Free T4 1.09  0.80 - 1.80 ng/dL   Assessment/Plan: Dermatitis Rx Triamcinolone ointment to apply sparingly BID for 1 weeks.  Cool compresses.  OTC Antihistamine to help with itch.  Avoid scratching.

## 2013-06-30 NOTE — Assessment & Plan Note (Signed)
Rx Triamcinolone ointment to apply sparingly BID for 1 weeks.  Cool compresses.  OTC Antihistamine to help with itch.  Avoid scratching.

## 2013-07-10 ENCOUNTER — Telehealth: Payer: Self-pay | Admitting: Family Medicine

## 2013-07-10 MED ORDER — ATENOLOL 25 MG PO TABS: 25.0000 mg | ORAL_TABLET | Freq: Every day | ORAL | Status: DC

## 2013-07-10 NOTE — Telephone Encounter (Signed)
Refill-atenolol  [facility]

## 2013-07-12 LAB — HM MAMMOGRAPHY: HM MAMMO: NORMAL

## 2013-07-13 ENCOUNTER — Encounter: Payer: Self-pay | Admitting: Family Medicine

## 2013-07-17 ENCOUNTER — Telehealth: Payer: Self-pay | Admitting: Family Medicine

## 2013-07-17 NOTE — Telephone Encounter (Signed)
Patient is requesting refill on Atenolol sent to [facility] for 90 day supply with refills

## 2013-07-18 NOTE — Telephone Encounter (Signed)
Looks like this was done on 07-10-13

## 2013-07-19 ENCOUNTER — Other Ambulatory Visit: Payer: Self-pay

## 2013-07-19 MED ORDER — ATENOLOL 25 MG PO TABS: 25.0000 mg | ORAL_TABLET | Freq: Every day | ORAL | Status: DC

## 2013-09-29 ENCOUNTER — Encounter: Payer: Medicare Other | Admitting: Family Medicine

## 2014-01-02 ENCOUNTER — Other Ambulatory Visit: Payer: Self-pay | Admitting: Family Medicine

## 2014-01-10 ENCOUNTER — Encounter: Payer: Self-pay | Admitting: Medical

## 2014-01-10 ENCOUNTER — Ambulatory Visit (INDEPENDENT_AMBULATORY_CARE_PROVIDER_SITE_OTHER): Payer: Medicare Other | Admitting: Medical

## 2014-01-10 VITALS — BP 190/78 | HR 61 | Temp 97.7°F | Ht 66.5 in | Wt 167.4 lb

## 2014-01-10 DIAGNOSIS — I10 Essential (primary) hypertension: Secondary | ICD-10-CM

## 2014-01-10 LAB — COMPREHENSIVE METABOLIC PANEL
ALBUMIN: 3.7 g/dL (ref 3.5–5.2)
ALT: 32 U/L (ref 0–35)
AST: 45 U/L — AB (ref 0–37)
Alkaline Phosphatase: 54 U/L (ref 39–117)
BUN: 39 mg/dL — ABNORMAL HIGH (ref 6–23)
CALCIUM: 10.3 mg/dL (ref 8.4–10.5)
CHLORIDE: 99 meq/L (ref 96–112)
CO2: 29 mEq/L (ref 19–32)
Creatinine, Ser: 1.5 mg/dL — ABNORMAL HIGH (ref 0.4–1.2)
GFR: 36.13 mL/min — ABNORMAL LOW (ref >60.00)
Glucose, Bld: 87 mg/dL (ref 70–99)
Potassium: 4.1 mEq/L (ref 3.5–5.1)
SODIUM: 135 meq/L (ref 135–145)
TOTAL PROTEIN: 6.3 g/dL (ref 6.0–8.3)
Total Bilirubin: 0.5 mg/dL (ref 0.2–1.2)

## 2014-01-10 MED ORDER — AMLODIPINE BESYLATE 10 MG PO TABS: 10.0000 mg | ORAL_TABLET | Freq: Every day | ORAL | Status: DC

## 2014-01-10 NOTE — Assessment & Plan Note (Signed)
For your hypertension, we will increase your amlodipine to 10 mg every day. I want to get a cmp today as well.  Continue with same dose of lisinopril which is 20 mg po bid. We may need to give you a diuretic low dose or maybe another medication such as clonidine. When we call you on labs will advise with any additional changes.   Continue the atenolol at same dose.  If your bp is increasing or you have any neurologic or cardiac symptoms then ED evaluation.  Follow up Friday morning for blood pressure check.

## 2014-01-10 NOTE — Progress Notes (Signed)
Pre visit review using our clinic review tool, if applicable. No additional management support is needed unless otherwise documented below in the visit note. 

## 2014-01-10 NOTE — Progress Notes (Signed)
Subjective:    Patient ID: Jillian Hayes, female    DOB: 01-May-1945, 68 y.o.   MRN: [id number]  HPI   Pt in today reporting that she has been checking her blood pressure last 2/couple of months. She states usually her systolic is in [id number] and diastolic in the XX123456. Occasionally she would get 0000000 systolic. Diastolic usually would not go up.   She has felt fatigued with holiday and death in family but no cardiac or neurologic signs or symptoms. Today she checked and it was 194/78. Pulse of 61. Pt states years of htn. More than 10 years.  Past Medical History  Diagnosis Date  . Allergy     seasonal  . Anemia     iron deficiency  . Depression 1991    hospitalized  . Anxiety   . Hypertension   . Insomnia 06/24/2010  . Overweight(278.02) 06/24/2010  . Fatigue 06/24/2010  . Chronic headaches 06/24/2010  . Multiple chemical sensitivity syndrome 06/25/2010  . History of chicken pox 06/25/2010  . Bladder polyps 06/25/2010  . Renal insufficiency 03/26/2011  . Preventative health care 11/09/2011  . Diarrhea 06/12/2012  . Benign fundic gland polyps of stomach     History   Social History  . Marital Status: Married    Spouse Name: N/A    Number of Children: 2  . Years of Education: N/A   Occupational History  . retired    Social History Main Topics  . Smoking status: Never Smoker   . Smokeless tobacco: Never Used  . Alcohol Use: No  . Drug Use: No  . Sexual Activity:    Partners: Male   Other Topics Concern  . Not on file   Social History Narrative    Past Surgical History  Procedure Laterality Date  . Polyps on bladder removed  1972    benign  . Cyst on left breast removed Left 1991    benign  . Nasal septum surgery  1986    rhinoplasty  . Tonsillectomy  1962    Family History  Problem Relation Age of Onset  . Breast cancer Mother 81    left breast removed, 2010 lung  . Nephrolithiasis Father   . Heart attack Father 7    X 3  . Heart disease Father     smoker    . Hypertension Brother   . Melanoma Son 37    melanoma on leg removed  . Pancreatic cancer Maternal Grandmother   . Hypertension Paternal Grandmother   . Obesity Paternal Grandmother   . Diabetes Mother   . Diabetes Maternal Aunt     x 2  . Diabetes Maternal Uncle     x 3    Allergies  Allergen Reactions  . Sulfa Antibiotics Nausea And Vomiting    Current Outpatient Prescriptions on File Prior to Visit  Medication Sig Dispense Refill  . ALFALFA PO Take 4,500 mg by mouth daily.      Marland Kitchen amLODipine (NORVASC) 5 MG tablet Take 1 tablet (5 mg total) by mouth daily. 90 tablet 3  . atenolol (TENORMIN) 25 MG tablet TAKE 1 BY MOUTH DAILY 90 tablet 0  . b complex vitamins capsule Take 1 capsule by mouth daily.    . Calcium Carbonate-Vitamin D (CALCIUM + D PO) Take 1,000 mg by mouth daily.      . cholecalciferol (VITAMIN D) 1000 UNITS tablet Take 1,000 Units by mouth daily.      . Ferrous Fumarate (IRON)  18 MG TBCR Take 1 tablet by mouth daily.      . Flaxseed, Linseed, (FLAX SEED OIL PO) Take 1 capsule by mouth 3 (three) times daily.    . L-GLUTAMINE PO Take 1 capsule by mouth daily.    Marland Kitchen lisinopril (PRINIVIL,ZESTRIL) 20 MG tablet Take 1 tablet (20 mg total) by mouth 2 (two) times daily. 180 tablet 3  . Probiotic Product (PROBIOTIC DAILY PO) Take 1 tablet by mouth daily.    . vitamin C (ASCORBIC ACID) 500 MG tablet Take 500 mg by mouth as needed.       No current facility-administered medications on file prior to visit.    BP 190/78 mmHg  Pulse 61  Temp(Src) 97.7 F (36.5 C) (Oral)  Ht 5' 6.5" (1.689 m)  Wt 167 lb 6.4 oz (75.932 kg)  BMI 26.62 kg/m2  SpO2 99%      Review of Systems  Constitutional: Negative for fever, chills, diaphoresis, activity change and fatigue.  Respiratory: Negative for cough, chest tightness and shortness of breath.   Cardiovascular: Negative for chest pain, palpitations and leg swelling.  Musculoskeletal: Negative for neck pain and neck stiffness.   Neurological: Negative for dizziness, tremors, seizures, syncope, facial asymmetry, speech difficulty, weakness, light-headedness, numbness and headaches.       Pt initially stated no dizziness but then states last 6 months felt slight of balance at times. This is not coinciding with recent bp elevation.  Psychiatric/Behavioral: Negative for behavioral problems, confusion and agitation. The patient is not nervous/anxious.        Objective:   Physical Exam  General Mental Status- Alert. General Appearance- Not in acute distress.   Skin General: Color- Normal Color. Moisture- Normal Moisture.  Neck Carotid Arteries- Normal color. Moisture- Normal Moisture. No carotid bruits. No JVD.  Chest and Lung Exam Auscultation: Breath Sounds:-Normal.  Cardiovascular Auscultation:Rythm- Regular. Murmurs & Other Heart Sounds:Auscultation of the heart reveals- No Murmurs.  Abdomen Inspection:-Inspeection Normal. Palpation/Percussion:Note:No mass. Palpation and Percussion of the abdomen reveal- Non Tender, Non Distended + BS, no rebound or guarding.    Neurologic Cranial Nerve exam:- CN III-XII intact(No nystagmus), symmetric smile. Drift Test:- No drift. Romberg Exam:- Negative.  Heal to Toe Gait exam:-Normal. Finger to Nose:- Normal/Intact Strength:- 5/5 equal and symmetric strength both upper and lower extremities.       Assessment & Plan:

## 2014-01-10 NOTE — Patient Instructions (Addendum)
For your hypertension, we will increase your amlodipine to 10 mg every day. I want to get a cmp today as well.  Continue with same dose of lisinopril which is 20 mg po bid. We may need to give you a diuretic low dose or maybe another medication such as clonidine. When we call you on labs will advise with any additional changes.   Continue the atenolol at same dose.  If your bp is increasing or you have any neurologic or cardiac symptoms then ED evaluation.  Follow up Friday morning for blood pressure check.  Discussed management plan with Dr. Larose Kells today.

## 2014-01-11 ENCOUNTER — Telehealth: Payer: Self-pay | Admitting: Medical

## 2014-01-11 MED ORDER — CLONIDINE HCL 0.1 MG PO TABS: 0.1000 mg | ORAL_TABLET | Freq: Two times a day (BID) | ORAL | Status: DC

## 2014-01-11 NOTE — Telephone Encounter (Signed)
I called pt and she states her bp reading today systolic were [id number], [id number], and 138. Diastolics have been [id number] historically. Pt gfr is low and cr increased. I decided to add clonidine to her regimen. So will send in clonidine to her pharmacy. She will pick up tomorrow am and take before her appointment at 10 am.  Pt states very minimal faint ha early in the day but no other neurologic type symptoms and no cardiac type symptoms.

## 2014-01-12 ENCOUNTER — Ambulatory Visit (INDEPENDENT_AMBULATORY_CARE_PROVIDER_SITE_OTHER): Payer: Medicare Other | Admitting: Medical

## 2014-01-12 ENCOUNTER — Encounter: Payer: Self-pay | Admitting: Medical

## 2014-01-12 VITALS — BP 143/70 | HR 61 | Temp 98.0°F | Wt 165.6 lb

## 2014-01-12 DIAGNOSIS — I10 Essential (primary) hypertension: Secondary | ICD-10-CM

## 2014-01-12 MED ORDER — CLONIDINE HCL 0.1 MG PO TABS: 0.1000 mg | ORAL_TABLET | Freq: Two times a day (BID) | ORAL | Status: DC

## 2014-01-12 NOTE — Patient Instructions (Addendum)
For your hypertension, Continue with clonidine and continue your current bp meds.  Keep checking your blood pressures and give [place] update as to high bp, average bp, and low bp readings in one week.  Follow up jan 14th with Dr. Randel Pigg but as needed for unstable bp levels or changning signs/symptoms.  I want you to check your bp next 2 days and confirm not getting too high or too low levels before your start walking/exercising.

## 2014-01-12 NOTE — Progress Notes (Signed)
Subjective:    Patient ID: Jillian Hayes, female    DOB: 1945/06/08, 68 y.o.   MRN: [id number]  HPI   Pt in stating this am before she got her new rx of clonidine . Before clonidine this am bp was 167/66. She has taken her baseline med meds today and then  she took the 0.1 clonidine at 9:10. Her bp here was 148/76 here in office about 1 hour later  Last night when she woke her bp was 190/70(something/pt was not sure of diastolic).  Pt reports no cardiac or neurologic signs or symptoms.  This is follow up from the other day when she cam in with quite high bp. Already on amlodipine 5 mg q day, atenolol 25 mg q day, and lisinopril 20 mg bid. Last visit increased her amlodipine to 10 mg q day. But her bp were still high so I called in clonidine.    Past Medical History  Diagnosis Date  . Allergy     seasonal  . Anemia     iron deficiency  . Depression 1991    hospitalized  . Anxiety   . Hypertension   . Insomnia 06/24/2010  . Overweight(278.02) 06/24/2010  . Fatigue 06/24/2010  . Chronic headaches 06/24/2010  . Multiple chemical sensitivity syndrome 06/25/2010  . History of chicken pox 06/25/2010  . Bladder polyps 06/25/2010  . Renal insufficiency 03/26/2011  . Preventative health care 11/09/2011  . Diarrhea 06/12/2012  . Benign fundic gland polyps of stomach     History   Social History  . Marital Status: Married    Spouse Name: N/A    Number of Children: 2  . Years of Education: N/A   Occupational History  . retired    Social History Main Topics  . Smoking status: Never Smoker   . Smokeless tobacco: Never Used  . Alcohol Use: No  . Drug Use: No  . Sexual Activity:    Partners: Male   Other Topics Concern  . Not on file   Social History Narrative    Past Surgical History  Procedure Laterality Date  . Polyps on bladder removed  1972    benign  . Cyst on left breast removed Left 1991    benign  . Nasal septum surgery  1986    rhinoplasty  . Tonsillectomy   1962    Family History  Problem Relation Age of Onset  . Breast cancer Mother 52    left breast removed, 2010 lung  . Nephrolithiasis Father   . Heart attack Father 46    X 3  . Heart disease Father     smoker  . Hypertension Brother   . Melanoma Son 37    melanoma on leg removed  . Pancreatic cancer Maternal Grandmother   . Hypertension Paternal Grandmother   . Obesity Paternal Grandmother   . Diabetes Mother   . Diabetes Maternal Aunt     x 2  . Diabetes Maternal Uncle     x 3    Allergies  Allergen Reactions  . Sulfa Antibiotics Nausea And Vomiting    Current Outpatient Prescriptions on File Prior to Visit  Medication Sig Dispense Refill  . ALFALFA PO Take 4,500 mg by mouth daily.      Marland Kitchen amLODipine (NORVASC) 10 MG tablet Take 1 tablet (10 mg total) by mouth daily. 30 tablet 1  . amLODipine (NORVASC) 5 MG tablet Take 1 tablet (5 mg total) by mouth daily. 90 tablet 3  .  atenolol (TENORMIN) 25 MG tablet TAKE 1 BY MOUTH DAILY 90 tablet 0  . b complex vitamins capsule Take 1 capsule by mouth daily.    . Calcium Carbonate-Vitamin D (CALCIUM + D PO) Take 1,000 mg by mouth daily.      . cholecalciferol (VITAMIN D) 1000 UNITS tablet Take 1,000 Units by mouth daily.      . cloNIDine (CATAPRES) 0.1 MG tablet Take 1 tablet (0.1 mg total) by mouth 2 (two) times daily. 60 tablet 0  . Ferrous Fumarate (IRON) 18 MG TBCR Take 1 tablet by mouth daily.      . Flaxseed, Linseed, (FLAX SEED OIL PO) Take 1 capsule by mouth 3 (three) times daily.    . L-GLUTAMINE PO Take 1 capsule by mouth daily.    Marland Kitchen lisinopril (PRINIVIL,ZESTRIL) 20 MG tablet Take 1 tablet (20 mg total) by mouth 2 (two) times daily. 180 tablet 3  . Probiotic Product (PROBIOTIC DAILY PO) Take 1 tablet by mouth daily.    . vitamin C (ASCORBIC ACID) 500 MG tablet Take 500 mg by mouth as needed.       No current facility-administered medications on file prior to visit.    BP 143/70 mmHg  Pulse 61  Temp(Src) 98 F (36.7  C) (Oral)  Wt 165 lb 9.6 oz (75.116 kg)  SpO2 96%     Review of Systems  Constitutional: Negative for fever, chills, diaphoresis, activity change and fatigue.  Respiratory: Negative for cough, chest tightness and shortness of breath.   Cardiovascular: Negative for chest pain, palpitations and leg swelling.  Gastrointestinal: Negative for nausea, vomiting and abdominal pain.  Musculoskeletal: Negative for neck pain and neck stiffness.  Neurological: Negative for dizziness, tremors, seizures, syncope, facial asymmetry, speech difficulty, weakness, light-headedness, numbness and headaches.  Psychiatric/Behavioral: Negative for behavioral problems, confusion and agitation. The patient is not nervous/anxious.        Objective:   Physical Exam   General Mental Status- Alert. General Appearance- Not in acute distress.   Skin General: Color- Normal Color. Moisture- Normal Moisture.  Neck Carotid Arteries- Normal color. Moisture- Normal Moisture. No carotid bruits. No JVD.  Chest and Lung Exam Auscultation: Breath Sounds:-Normal.  Cardiovascular Auscultation:Rythm- Regular. Murmurs & Other Heart Sounds:Auscultation of the heart reveals- No Murmurs.  Abdomen Inspection:-Inspeection Normal. Palpation/Percussion:Note:No mass. Palpation and Percussion of the abdomen reveal- Non Tender, Non Distended + BS, no rebound or guarding.    Neurologic Cranial Nerve exam:- CN III-XII intact(No nystagmus), symmetric smile. Drift Test:- No drift. Romberg Exam:- Negative.  Finger to nose intact. Strength:- 5/5 equal and symmetric strength both upper and lower extremities.        Assessment & Plan:

## 2014-01-12 NOTE — Assessment & Plan Note (Signed)
For your hypertension, Continue with clonidine and continue your current bp meds.  Keep checking your blood pressures and give [place] update as to high bp, average bp, and low bp readings in one week.  Follow up jan 14th with Dr. Randel Pigg but as needed for unstable bp levels or changing signs/symptoms.  I want you to check your bp next 2 days and confirm not getting too high or too low levels before your start walking/exercising.

## 2014-01-16 ENCOUNTER — Emergency Department (HOSPITAL_COMMUNITY): Payer: Medicare Other

## 2014-01-16 ENCOUNTER — Encounter: Payer: Self-pay | Admitting: Medical

## 2014-01-16 ENCOUNTER — Telehealth: Payer: Self-pay | Admitting: Medical

## 2014-01-16 ENCOUNTER — Encounter (HOSPITAL_COMMUNITY): Payer: Self-pay | Admitting: *Deleted

## 2014-01-16 ENCOUNTER — Other Ambulatory Visit: Payer: Self-pay

## 2014-01-16 ENCOUNTER — Emergency Department (HOSPITAL_COMMUNITY)
Admission: EM | Admit: 2014-01-16 | Discharge: 2014-01-17 | Disposition: A | Discharge status: Home / Self Care | Payer: Medicare Other | Attending: Emergency Medicine | Admitting: Emergency Medicine

## 2014-01-16 ENCOUNTER — Ambulatory Visit (INDEPENDENT_AMBULATORY_CARE_PROVIDER_SITE_OTHER): Payer: Medicare Other | Admitting: Family Medicine

## 2014-01-16 VITALS — BP 134/88 | HR 58 | Temp 97.3°F | Resp 18 | Ht 67.0 in | Wt 165.2 lb

## 2014-01-16 DIAGNOSIS — R9431 Abnormal electrocardiogram [ECG] [EKG]: Secondary | ICD-10-CM

## 2014-01-16 DIAGNOSIS — F419 Anxiety disorder, unspecified: Secondary | ICD-10-CM | POA: Diagnosis not present

## 2014-01-16 DIAGNOSIS — Z79899 Other long term (current) drug therapy: Secondary | ICD-10-CM | POA: Diagnosis not present

## 2014-01-16 DIAGNOSIS — I517 Cardiomegaly: Secondary | ICD-10-CM | POA: Diagnosis not present

## 2014-01-16 DIAGNOSIS — E663 Overweight: Secondary | ICD-10-CM | POA: Insufficient documentation

## 2014-01-16 DIAGNOSIS — R001 Bradycardia, unspecified: Secondary | ICD-10-CM

## 2014-01-16 DIAGNOSIS — Z8669 Personal history of other diseases of the nervous system and sense organs: Secondary | ICD-10-CM | POA: Insufficient documentation

## 2014-01-16 DIAGNOSIS — Z8719 Personal history of other diseases of the digestive system: Secondary | ICD-10-CM | POA: Diagnosis not present

## 2014-01-16 DIAGNOSIS — R42 Dizziness and giddiness: Secondary | ICD-10-CM

## 2014-01-16 DIAGNOSIS — Z86018 Personal history of other benign neoplasm: Secondary | ICD-10-CM | POA: Diagnosis not present

## 2014-01-16 DIAGNOSIS — D509 Iron deficiency anemia, unspecified: Secondary | ICD-10-CM | POA: Diagnosis not present

## 2014-01-16 DIAGNOSIS — R51 Headache: Secondary | ICD-10-CM

## 2014-01-16 DIAGNOSIS — I10 Essential (primary) hypertension: Secondary | ICD-10-CM | POA: Diagnosis not present

## 2014-01-16 DIAGNOSIS — R531 Weakness: Secondary | ICD-10-CM | POA: Diagnosis present

## 2014-01-16 DIAGNOSIS — Z8619 Personal history of other infectious and parasitic diseases: Secondary | ICD-10-CM | POA: Insufficient documentation

## 2014-01-16 DIAGNOSIS — Z87448 Personal history of other diseases of urinary system: Secondary | ICD-10-CM | POA: Insufficient documentation

## 2014-01-16 DIAGNOSIS — R5383 Other fatigue: Secondary | ICD-10-CM

## 2014-01-16 LAB — BASIC METABOLIC PANEL
ANION GAP: 13 (ref 5–15)
BUN: 51 mg/dL — ABNORMAL HIGH (ref 6–23)
CALCIUM: 10.7 mg/dL — AB (ref 8.4–10.5)
CO2: 24 mEq/L (ref 19–32)
Chloride: 102 mEq/L (ref 96–112)
Creatinine, Ser: 1.87 mg/dL — ABNORMAL HIGH (ref 0.50–1.10)
GFR calc Af Amer: 31 mL/min — ABNORMAL LOW (ref >90)
GFR, EST NON AFRICAN AMERICAN: 27 mL/min — AB (ref >90)
Glucose, Bld: 105 mg/dL — ABNORMAL HIGH (ref 70–99)
Potassium: 4.8 mEq/L (ref 3.7–5.3)
Sodium: 139 mEq/L (ref 137–147)

## 2014-01-16 LAB — URINALYSIS, ROUTINE W REFLEX MICROSCOPIC
Bilirubin Urine: NEGATIVE
Glucose, UA: NEGATIVE mg/dL
Ketones, ur: NEGATIVE mg/dL
Leukocytes, UA: NEGATIVE
Nitrite: NEGATIVE
Protein, ur: >300 mg/dL — AB
Specific Gravity, Urine: 1.01 (ref 1.005–1.030)
Urobilinogen, UA: 0.2 mg/dL (ref 0.0–1.0)
pH: 6 (ref 5.0–8.0)

## 2014-01-16 LAB — URINE MICROSCOPIC-ADD ON

## 2014-01-16 LAB — CBC WITH DIFFERENTIAL/PLATELET
BASOS PCT: 1 % (ref 0–1)
Basophils Absolute: 0 10*3/uL (ref 0.0–0.1)
EOS ABS: 0.1 10*3/uL (ref 0.0–0.7)
Eosinophils Relative: 1 % (ref 0–5)
HCT: 34.9 % — ABNORMAL LOW (ref 36.0–46.0)
Hemoglobin: 11.2 g/dL — ABNORMAL LOW (ref 12.0–15.0)
Lymphocytes Relative: 23 % (ref 12–46)
Lymphs Abs: 1.1 10*3/uL (ref 0.7–4.0)
MCH: 27.5 pg (ref 26.0–34.0)
MCHC: 32.1 g/dL (ref 30.0–36.0)
MCV: 85.5 fL (ref 78.0–100.0)
Monocytes Absolute: 0.3 10*3/uL (ref 0.1–1.0)
Monocytes Relative: 7 % (ref 3–12)
Neutro Abs: 3.3 10*3/uL (ref 1.7–7.7)
Neutrophils Relative %: 68 % (ref 43–77)
PLATELETS: 211 10*3/uL (ref 150–400)
RBC: 4.08 MIL/uL (ref 3.87–5.11)
RDW: 14.4 % (ref 11.5–15.5)
WBC: 4.9 10*3/uL (ref 4.0–10.5)

## 2014-01-16 LAB — TROPONIN I: Troponin I: <0.3 ng/mL (ref <0.30)

## 2014-01-16 MED ORDER — LISINOPRIL 20 MG PO TABS
20.0000 mg | ORAL_TABLET | Freq: Once | ORAL | Status: AC
  Administered 2014-01-16: 20 mg via ORAL
  Filled 2014-01-16: qty 1

## 2014-01-16 NOTE — ED Notes (Signed)
PT to ED via [facility] from [facility] for weakness. On Friday, pt had increased dose of amlodipine from 5 mg to 10mg  and also started on clonidine 0.1mg  for hypertension. Pt reports since taking clonidine has been feeling weak, dizzy, and seeing blurred numbers. UCC tx patient for concerns for elevation and bradycardia. Pt denies chest pain or shortness of breath. EMS VS bp 152/57 hr 61 22g L hand

## 2014-01-16 NOTE — ED Notes (Signed)
Pt began an increased dose of amlodipine on Friday without significant side effects. Pt reports feeling "drained and no energy" since taking clonidine. Neuro status WDL denies chest pain or shortness of breath.

## 2014-01-16 NOTE — ED Notes (Signed)
Pt requesting water, and lisinopril (home medication) and a bedside commode; all provided for patient. Awaiting repeat troponin

## 2014-01-16 NOTE — Telephone Encounter (Signed)
Pt has dizziness, weakness, anxiety, fatigue and some pulse readings below 60. I tried to get her in today with one of our providers since this is my half day but everyone is filled. So I gave pt addresses and phone numbers of [facility]. I explained to patient with her symptoms she describes she needs to be seen today. She agreed she would get checked today.

## 2014-01-16 NOTE — ED Notes (Signed)
Pt reports palpitations; has not taken calcium medications today and reports palpitations occur if supplements are not taken. Occasional PVCs noted

## 2014-01-16 NOTE — Patient Instructions (Signed)
We are sending you to emergency room for evaluation of your dizziness, lightheadedness and possible EKG changes.

## 2014-01-16 NOTE — ED Provider Notes (Signed)
CSN: [id number]     Arrival date & time 01/16/14  1746 History   First MD Initiated Contact with Patient 01/16/14 1757     Chief Complaint  Patient presents with  . Weakness     (Consider location/radiation/quality/duration/timing/severity/associated sxs/prior Treatment) Patient is a 68 y.o. female presenting with weakness.  Weakness This is a new problem. The current episode started in the past 7 days. The problem occurs constantly. The problem has been gradually improving. Associated symptoms include fatigue and weakness. Pertinent negatives include no abdominal pain, chest pain, fever, neck pain or rash. Nothing aggravates the symptoms. She has tried nothing for the symptoms. The treatment provided no relief.    Past Medical History  Diagnosis Date  . Allergy     seasonal  . Anemia     iron deficiency  . Depression 1991    hospitalized  . Anxiety   . Hypertension   . Insomnia 06/24/2010  . Overweight(278.02) 06/24/2010  . Fatigue 06/24/2010  . Chronic headaches 06/24/2010  . Multiple chemical sensitivity syndrome 06/25/2010  . History of chicken pox 06/25/2010  . Bladder polyps 06/25/2010  . Renal insufficiency 03/26/2011  . Preventative health care 11/09/2011  . Diarrhea 06/12/2012  . Benign fundic gland polyps of stomach    Past Surgical History  Procedure Laterality Date  . Polyps on bladder removed  1972    benign  . Cyst on left breast removed Left 1991    benign  . Nasal septum surgery  1986    rhinoplasty  . Tonsillectomy  1962   Family History  Problem Relation Age of Onset  . Breast cancer Mother 71    left breast removed, 2010 lung  . Nephrolithiasis Father   . Heart attack Father 69    X 3  . Heart disease Father     smoker  . Hypertension Brother   . Melanoma Son 37    melanoma on leg removed  . Pancreatic cancer Maternal Grandmother   . Hypertension Paternal Grandmother   . Obesity Paternal Grandmother   . Diabetes Mother   . Diabetes Maternal  Aunt     x 2  . Diabetes Maternal Uncle     x 3   History  Substance Use Topics  . Smoking status: Never Smoker   . Smokeless tobacco: Never Used  . Alcohol Use: No   OB History    No data available     Review of Systems  Constitutional: Positive for fatigue. Negative for fever.  Respiratory: Negative for shortness of breath.   Cardiovascular: Negative for chest pain and leg swelling.  Gastrointestinal: Negative for abdominal pain.  Musculoskeletal: Negative for neck pain and neck stiffness.  Skin: Negative for rash.  Neurological: Positive for weakness.  All other systems reviewed and are negative.     Allergies  Sulfa antibiotics  Home Medications   Prior to Admission medications   Medication Sig Start Date End Date Taking? Authorizing Provider  ALFALFA PO Take 4,500 mg by mouth daily.      Historical Provider, MD  amLODipine (NORVASC) 10 MG tablet Take 1 tablet (10 mg total) by mouth daily. 01/10/14   Meriam Sprague Saguier, PA-C  atenolol (TENORMIN) 25 MG tablet TAKE 1 BY MOUTH DAILY 01/02/14   [name], MD  b complex vitamins capsule Take 1 capsule by mouth daily.    Historical Provider, MD  Calcium Carbonate-Vitamin D (CALCIUM + D PO) Take 1,000 mg by mouth daily.  Historical Provider, MD  cholecalciferol (VITAMIN D) 1000 UNITS tablet Take 1,000 Units by mouth daily.      Historical Provider, MD  cloNIDine (CATAPRES) 0.1 MG tablet Take 1 tablet (0.1 mg total) by mouth 2 (two) times daily. 01/12/14   Meriam Sprague Saguier, PA-C  Ferrous Fumarate (IRON) 18 MG TBCR Take 1 tablet by mouth daily.      Historical Provider, MD  Flaxseed, Linseed, (FLAX SEED OIL PO) Take 1 capsule by mouth 3 (three) times daily.    Historical Provider, MD  L-GLUTAMINE PO Take 1 capsule by mouth daily.    Historical Provider, MD  lisinopril (PRINIVIL,ZESTRIL) 20 MG tablet Take 1 tablet (20 mg total) by mouth 2 (two) times daily. 06/20/13   Brunetta Jeans, PA-C  Probiotic Product (PROBIOTIC  DAILY PO) Take 1 tablet by mouth daily.    Historical Provider, MD  vitamin C (ASCORBIC ACID) 500 MG tablet Take 500 mg by mouth as needed.      Historical Provider, MD   BP 164/67 mmHg  Pulse 61  Temp(Src) 98.3 F (36.8 C) (Oral)  Resp 18  SpO2 98% Physical Exam  Constitutional: She is oriented to person, place, and time. She appears well-developed and well-nourished.  HENT:  Head: Normocephalic and atraumatic.  Eyes: Conjunctivae and EOM are normal. Right eye exhibits no discharge. Left eye exhibits no discharge.  Cardiovascular: Normal rate and regular rhythm.   Pulmonary/Chest: Effort normal and breath sounds normal. No respiratory distress.  Abdominal: Soft. She exhibits no distension. There is no tenderness. There is no rebound.  Musculoskeletal: Normal range of motion. She exhibits no edema or tenderness.  Neurological: She is alert and oriented to person, place, and time.  Skin: Skin is warm and dry.  Nursing note and vitals reviewed.   ED Course  Procedures (including critical care time) Labs Review Labs Reviewed  CBC WITH DIFFERENTIAL - Abnormal; Notable for the following:    Hemoglobin 11.2 (*)    HCT 34.9 (*)    All other components within normal limits  BASIC METABOLIC PANEL - Abnormal; Notable for the following:    Glucose, Bld 105 (*)    BUN 51 (*)    Creatinine, Ser 1.87 (*)    Calcium 10.7 (*)    GFR calc non Af Amer 27 (*)    GFR calc Af Amer 31 (*)    All other components within normal limits  URINALYSIS, ROUTINE W REFLEX MICROSCOPIC - Abnormal; Notable for the following:    Hgb urine dipstick TRACE (*)    Protein, ur >300 (*)    All other components within normal limits  URINE MICROSCOPIC-ADD ON - Abnormal; Notable for the following:    Squamous Epithelial / LPF FEW (*)    Casts GRANULAR CAST (*)    All other components within normal limits  TROPONIN I  TROPONIN I    Imaging Review Dg Chest 2 View  01/16/2014   CLINICAL DATA:  68 year old  female with history of weakness and productive cough for the past 8 days. Bradycardia.  EXAM: CHEST  2 VIEW  COMPARISON:  No priors.  FINDINGS: Mild diffuse peribronchial cuffing. Lung volumes are normal. No consolidative airspace disease. No pleural effusions. No pneumothorax. No pulmonary nodule or mass noted. Pulmonary vasculature and the cardiomediastinal silhouette are within normal limits. Atherosclerosis in the thoracic aorta.  IMPRESSION: 1. Mild diffuse peribronchial cuffing, which could suggest acute bronchitis. 2. Atherosclerosis.   Electronically Signed   By: Vinnie Langton  M.D.   On: 01/16/2014 20:48     EKG Interpretation None      MDM   Final diagnoses:  Bradycardia  LVH (left ventricular hypertrophy)    68 year old female recently started clonidine for high blood pressure and had episode of bradycardia today at having to 3 days of nausea and just overall fatigue and weak. Heart rate in the 40s today. To urgent care where she had an EKG they noted ST changes on her EKG so was sent here for further evaluation here does not appear to be ACS. Does appear to have LVH with repolarization abnormalities. Prolonged cardiac monitoring emergency department without any evidence of bradycardia. EKG repeated here still consistent with LVH with repolarization. Symptoms likely secondary to her clonidine. We'll DC her clonidine and have hertake daily blood pressures and follow up with her primary doctor for further management of her blood pressure.     Merrily Pew, MD 01/17/14 0002  Orpah Greek, MD 01/17/14 [phone]

## 2014-01-16 NOTE — Progress Notes (Signed)
Subjective:    Patient ID: Jillian Hayes, female    DOB: 11-11-1945, 68 y.o.   MRN: [id number]   This chart was scribed for Jillian Ray, MD by Tula Nakayama, ED Scribe. This patient was seen at [facility] and the patient's care was started at 4:30 PM  HPI [name] is a 68 y.o. female with a history of HTN complaining of a lower than baseline pulse. She also wants to discontinue clonidine that she started taking 5 days ago because of her adverse symptoms. Pt states mild, dull headache that is intermittently sharp/stabbing and started 4 days ago followed by dizziness, light-headedness, fatigue, dry mouth and intermittent slurred speech. She also notes blurred vision that occurred this morning when she measured a pulse of 42. Pt saw PA Saguier at [facility] last week who doubled her dosage of amlodipine from 5 mg to 10 mg and started her on clonodine 0.1 BID because her systolic BP was elevated at 201. She notes that she feels fine first thing in the morning, but that symptoms start approximately 1 hour after she takes medication. Pt reports a BP of 114/63 with a pulse of 42 this morning after she took medication. She does not take Aspirin. Pt denies a history of low pulse prior to starting new medications. She also denies unilateral weakness, CP, chest tightness and SOB as associated symptoms.   Pt's father had an MI at 25 years old.   PCP: [name], MD   Patient Active Problem List   Diagnosis Date Noted  . Dermatitis 06/30/2013  . Abnormal thyroid function test 06/25/2013  . Diarrhea 06/12/2012  . Cervical cancer screening 06/10/2012  . Preventative health care 11/09/2011  . Renal insufficiency 03/26/2011  . Multiple chemical sensitivity syndrome 06/25/2010  . History of chicken pox 06/25/2010  . Bladder polyps 06/25/2010  . History of cardiac dysrhythmia 06/25/2010  . Insomnia 06/24/2010  . Overweight 06/24/2010  . Fatigue 06/24/2010  . Chronic headaches 06/24/2010  .  Allergy   . Anemia   . Depression   . Hypertension    Past Medical History  Diagnosis Date  . Allergy     seasonal  . Anemia     iron deficiency  . Depression 1991    hospitalized  . Anxiety   . Hypertension   . Insomnia 06/24/2010  . Overweight(278.02) 06/24/2010  . Fatigue 06/24/2010  . Chronic headaches 06/24/2010  . Multiple chemical sensitivity syndrome 06/25/2010  . History of chicken pox 06/25/2010  . Bladder polyps 06/25/2010  . Renal insufficiency 03/26/2011  . Preventative health care 11/09/2011  . Diarrhea 06/12/2012  . Benign fundic gland polyps of stomach    Past Surgical History  Procedure Laterality Date  . Polyps on bladder removed  1972    benign  . Cyst on left breast removed Left 1991    benign  . Nasal septum surgery  1986    rhinoplasty  . Tonsillectomy  1962   Allergies  Allergen Reactions  . Sulfa Antibiotics Nausea And Vomiting   Prior to Admission medications   Medication Sig Start Date End Date Taking? Authorizing Provider  ALFALFA PO Take 4,500 mg by mouth daily.     Yes Historical Provider, MD  amLODipine (NORVASC) 10 MG tablet Take 1 tablet (10 mg total) by mouth daily. 01/10/14  Yes Meriam Sprague Saguier, PA-C  atenolol (TENORMIN) 25 MG tablet TAKE 1 BY MOUTH DAILY 01/02/14  Yes [name], MD  b complex vitamins capsule  Take 1 capsule by mouth daily.   Yes Historical Provider, MD  Calcium Carbonate-Vitamin D (CALCIUM + D PO) Take 1,000 mg by mouth daily.     Yes Historical Provider, MD  cholecalciferol (VITAMIN D) 1000 UNITS tablet Take 1,000 Units by mouth daily.     Yes Historical Provider, MD  cloNIDine (CATAPRES) 0.1 MG tablet Take 1 tablet (0.1 mg total) by mouth 2 (two) times daily. 01/12/14  Yes Meriam Sprague Saguier, PA-C  Ferrous Fumarate (IRON) 18 MG TBCR Take 1 tablet by mouth daily.     Yes Historical Provider, MD  Flaxseed, Linseed, (FLAX SEED OIL PO) Take 1 capsule by mouth 3 (three) times daily.   Yes Historical Provider, MD  L-GLUTAMINE  PO Take 1 capsule by mouth daily.   Yes Historical Provider, MD  lisinopril (PRINIVIL,ZESTRIL) 20 MG tablet Take 1 tablet (20 mg total) by mouth 2 (two) times daily. 06/20/13  Yes Brunetta Jeans, PA-C  Probiotic Product (PROBIOTIC DAILY PO) Take 1 tablet by mouth daily.   Yes Historical Provider, MD  vitamin C (ASCORBIC ACID) 500 MG tablet Take 500 mg by mouth as needed.     Yes Historical Provider, MD  amLODipine (NORVASC) 5 MG tablet Take 1 tablet (5 mg total) by mouth daily. Patient not taking: Reported on 01/16/2014 06/20/13   Brunetta Jeans, PA-C   History   Social History  . Marital Status: Married    Spouse Name: N/A    Number of Children: 2  . Years of Education: N/A   Occupational History  . retired    Social History Main Topics  . Smoking status: Never Smoker   . Smokeless tobacco: Never Used  . Alcohol Use: No  . Drug Use: No  . Sexual Activity:    Partners: Male   Other Topics Concern  . Not on file   Social History Narrative   Review of Systems  Constitutional: Positive for fatigue. Negative for unexpected weight change.  Eyes: Positive for visual disturbance.  Respiratory: Negative for chest tightness and shortness of breath.   Cardiovascular: Negative for chest pain, palpitations and leg swelling.  Gastrointestinal: Negative for abdominal pain and blood in stool.  Neurological: Positive for dizziness, speech difficulty, light-headedness and headaches. Negative for syncope.      Objective:   Physical Exam  Constitutional: She is oriented to person, place, and time. She appears well-developed and well-nourished.  HENT:  Head: Normocephalic and atraumatic.  No nystagmus  Eyes: Conjunctivae and EOM are normal. Pupils are equal, round, and reactive to light.  Neck: Carotid bruit is not present.  No carotid bruit  Cardiovascular: Regular rhythm, normal heart sounds and intact distal pulses.   Bradycardic, but regular rhythm  Pulmonary/Chest: Effort normal  and breath sounds normal.  Abdominal: Soft. She exhibits no pulsatile midline mass. There is no tenderness.  Neurological: She is alert and oriented to person, place, and time.  Negative Romberg; no pronator drift; strength equal in upper extremities and lower extremities bilaterally; slightly unsteady with heel-to-toe, but able to regain balance  Skin: Skin is warm and dry.  Psychiatric: She has a normal mood and affect. Her behavior is normal.  Nursing note and vitals reviewed.   Filed Vitals:   01/16/14 1515  BP: 134/88  Pulse: 58  Temp: 97.3 F (36.3 C)  TempSrc: Oral  Resp: 18  Height: 5\' 7"  (1.702 m)  Weight: 165 lb 3.2 oz (74.934 kg)  SpO2: 99%  EKG Sinus bradycardia, rate 56 Possible voltage criteria for LVH with large T waves laterally but also notable for ST elevation inferior lateral noted most in Lead II V3-V6. No prior EKG available for review.   Assessment & Plan:   4:50 PM Placed on monitor. Attempting IV.  4:53 PM Sinus rhythm on the monitor. EMS was called for transport.  4:58 PM Discussed with EDP. Will hold on Aspirin due to concurrent headache until evaluated at ER. Advised charge nurse at [facility] ER.   5:08 PM Report given to EMS with transfer of care.   SURINA ZABALETA is a 68 y.o. female Bradycardia - Plan: EKG 12-Lead  Lightheadedness  Dizziness  EKG abnormality  Essential hypertension  Hx of HTN with recent increase in meds, possible lightheadedness/dizziness and general fatigue form this medication change, but concerning EKG for ST elevation without prior EKG for comparison and cardiac RF's of age, htn and relative with FH of CAD. Possible anginal equivalent with above symptoms as BP ok in office today. HR still somewhat low vs. her baseline.  HA also present for 4 days, but nonfocal neuro exam. Decided against initial aspirin with HA. Discussed with EDP - can be decided at ER.  IV placed, tx by EMS for eval at [facility].    No orders of  the defined types were placed in this encounter.   Patient Instructions  We are sending you to emergency room for evaluation of your dizziness, lightheadedness and possible EKG changes.       I personally performed the services described in this documentation, which was scribed in my presence. The recorded information has been reviewed and considered, and addended by me as needed.

## 2014-01-17 ENCOUNTER — Encounter: Payer: Self-pay | Admitting: Family Medicine

## 2014-01-17 NOTE — ED Notes (Signed)
Pt attempting to find a ride home.

## 2014-01-17 NOTE — ED Notes (Signed)
Daughter en route to pick up patient.

## 2014-01-22 ENCOUNTER — Encounter: Payer: Self-pay | Admitting: Cardiology

## 2014-01-22 ENCOUNTER — Ambulatory Visit (INDEPENDENT_AMBULATORY_CARE_PROVIDER_SITE_OTHER): Payer: Medicare Other | Admitting: Cardiology

## 2014-01-22 VITALS — BP 140/70 | HR 63 | Ht 66.0 in | Wt 163.9 lb

## 2014-01-22 DIAGNOSIS — R9431 Abnormal electrocardiogram [ECG] [EKG]: Secondary | ICD-10-CM | POA: Insufficient documentation

## 2014-01-22 DIAGNOSIS — I159 Secondary hypertension, unspecified: Secondary | ICD-10-CM

## 2014-01-22 DIAGNOSIS — I10 Essential (primary) hypertension: Secondary | ICD-10-CM | POA: Insufficient documentation

## 2014-01-22 NOTE — Progress Notes (Signed)
HPI The patient presents as a new patient. She has no past cardiac history. She has had hypertension. Recently her blood pressure was elevated to 190/78 and her amlodipine was increased. She was also started on clonidine. However, she became relatively hypotensive and had some bradycardia and was referred to urgent care. I have reviewed these records. There she did have some T-wave inversion on her EKG but was not felt to be having any acute cardiovascular symptoms. Her clonidine was stopped. I did review her labs and noticed that she has a mild anemia. She's also had some chronic renal insufficiency that was slightly worse than previous. She was referred here for followup. She's never had any cardiac workup and has no history of coronary artery disease. She's had no chest pressure, neck or arm discomfort. She does feel her heart beating strongly in her chest when she lies on her left side at night. She's not had any PND or orthopnea. She's had no weight gain or edema. She does walk for exercise. She does have headaches and feels fatigued. I did review her blood pressure.   Allergies  Allergen Reactions  . Sulfa Antibiotics Nausea And Vomiting    Current Outpatient Prescriptions  Medication Sig Dispense Refill  . ALFALFA PO Take 4,500 mg by mouth daily.      Marland Kitchen amLODipine (NORVASC) 10 MG tablet Take 1 tablet (10 mg total) by mouth daily. 30 tablet 1  . atenolol (TENORMIN) 25 MG tablet TAKE 1 BY MOUTH DAILY 90 tablet 0  . b complex vitamins capsule Take 1 capsule by mouth daily.    . Calcium Carbonate-Vitamin D (CALCIUM + D PO) Take 1,000 mg by mouth daily.      . cholecalciferol (VITAMIN D) 1000 UNITS tablet Take 1,000 Units by mouth daily.      . Ferrous Fumarate (IRON) 18 MG TBCR Take 1 tablet by mouth daily.      . Flaxseed, Linseed, (FLAX SEED OIL PO) Take 1 capsule by mouth 3 (three) times daily.    . L-GLUTAMINE PO Take 1 capsule by mouth daily.    Marland Kitchen lisinopril (PRINIVIL,ZESTRIL) 20 MG  tablet Take 1 tablet (20 mg total) by mouth 2 (two) times daily. 180 tablet 3  . Probiotic Product (PROBIOTIC DAILY PO) Take 1 tablet by mouth daily.    . vitamin C (ASCORBIC ACID) 500 MG tablet Take 500 mg by mouth daily as needed.      No current facility-administered medications for this visit.    Past Medical History  Diagnosis Date  . Allergy     seasonal  . Anemia     iron deficiency  . Depression 1991    hospitalized  . Anxiety   . Hypertension   . Insomnia 06/24/2010  . Overweight(278.02) 06/24/2010  . Fatigue 06/24/2010  . Chronic headaches 06/24/2010  . Multiple chemical sensitivity syndrome 06/25/2010  . History of chicken pox 06/25/2010  . Bladder polyps 06/25/2010  . Renal insufficiency 03/26/2011  . Preventative health care 11/09/2011  . Diarrhea 06/12/2012  . Benign fundic gland polyps of stomach     Past Surgical History  Procedure Laterality Date  . Polyps on bladder removed  1972    benign  . Cyst on left breast removed Left 1991    benign  . Nasal septum surgery  1986    rhinoplasty  . Tonsillectomy  1962    Family History  Problem Relation Age of Onset  . Breast cancer Mother 90  left breast removed, 2010 lung  . Nephrolithiasis Father   . Heart attack Father 29    X 3  . Heart disease Father     smoker  . Hypertension Brother   . Melanoma Son 37    melanoma on leg removed  . Pancreatic cancer Maternal Grandmother   . Hypertension Paternal Grandmother   . Obesity Paternal Grandmother   . Diabetes Mother   . Diabetes Maternal Aunt     x 2  . Diabetes Maternal Uncle     x 3    History   Social History  . Marital Status: Married    Spouse Name: N/A    Number of Children: 2  . Years of Education: N/A   Occupational History  . retired    Social History Main Topics  . Smoking status: Never Smoker   . Smokeless tobacco: Never Used  . Alcohol Use: No  . Drug Use: No  . Sexual Activity:    Partners: Male   Other Topics Concern  .  Not on file   Social History Narrative    ROS:   Positive for occasional reflux, leg cramps.  Otherwise as stated in the HPI and negative for all other systems.   PHYSICAL EXAM BP 140/70 mmHg  Pulse 63  Ht 5\' 6"  (1.676 m)  Wt 163 lb 14.4 oz (74.345 kg)  BMI 26.47 kg/m2  GENERAL:  Well appearing HEENT:  Pupils equal round and reactive, fundi not visualized, oral mucosa unremarkable NECK:  No jugular venous distention, waveform within normal limits, carotid upstroke brisk and symmetric, no bruits, no thyromegaly LYMPHATICS:  No cervical, inguinal adenopathy LUNGS:  Clear to auscultation bilaterally BACK:  No CVA tenderness CHEST:  Unremarkable HEART:  PMI not displaced or sustained,S1 and S2 within normal limits, no S3, no S4, no clicks, no rubs, no murmurs ABD:  Flat, positive bowel sounds normal in frequency in pitch, no bruits, no rebound, no guarding, no midline pulsatile mass, no hepatomegaly, no splenomegaly EXT:  2 plus pulses throughout, no edema, no cyanosis no clubbing SKIN:  No rashes no nodules NEURO:  Cranial nerves II through XII grossly intact, motor grossly intact throughout PSYCH:  Cognitively intact, oriented to person place and time   EKG:    Sinus rhythm, rate 64, axis within normal limits, intervals within normal limits, LVH by voltage criteria, borderline anterior T-wave inversion.  01/16/14  ASSESSMENT AND PLAN  HTN:  For now we'll leave her blood pressure medications as listed. She will keep a diary of blood pressure readings every other day. I will check renal Dopplers given her renal insufficiency and hypertension. The next step in med adjustment might be the addition of hydralazine.  ABNORMAL EKG:  I suspect this is related to LVH. I will check an echocardiogram and consider further stress testing with a POET (Plain Old Exercise Treadmill)  CKD:  Her creatinine was up slightly at the urgent care. I discussed this with her. She also has had protein in her  urine. This will be evaluated as above and I also suggested followup with Dr. Randel Pigg

## 2014-01-22 NOTE — Patient Instructions (Signed)
Your physician recommends that you schedule a follow-up appointment in: 6 months with Dr. Percival Spanish  We are ordering some test for you to get done

## 2014-02-07 ENCOUNTER — Ambulatory Visit (HOSPITAL_COMMUNITY): Payer: Medicare Other

## 2014-02-07 ENCOUNTER — Encounter (HOSPITAL_COMMUNITY): Payer: Medicare Other

## 2014-02-08 ENCOUNTER — Ambulatory Visit (HOSPITAL_COMMUNITY)
Admission: RE | Admit: 2014-02-08 | Discharge: 2014-02-08 | Disposition: A | Discharge status: Home / Self Care | Payer: Medicare Other | Source: Ambulatory Visit | Attending: Cardiovascular Disease | Admitting: Cardiovascular Disease

## 2014-02-08 DIAGNOSIS — R9431 Abnormal electrocardiogram [ECG] [EKG]: Secondary | ICD-10-CM | POA: Diagnosis present

## 2014-02-08 DIAGNOSIS — I10 Essential (primary) hypertension: Secondary | ICD-10-CM | POA: Insufficient documentation

## 2014-02-08 DIAGNOSIS — I517 Cardiomegaly: Secondary | ICD-10-CM

## 2014-02-08 DIAGNOSIS — N189 Chronic kidney disease, unspecified: Secondary | ICD-10-CM | POA: Insufficient documentation

## 2014-02-08 DIAGNOSIS — I159 Secondary hypertension, unspecified: Secondary | ICD-10-CM

## 2014-02-08 DIAGNOSIS — Z8249 Family history of ischemic heart disease and other diseases of the circulatory system: Secondary | ICD-10-CM | POA: Insufficient documentation

## 2014-02-08 NOTE — Progress Notes (Signed)
Renal Duplex Completed. Tamina Cyphers, BS, RDMS, RVT  

## 2014-02-08 NOTE — Progress Notes (Signed)
2D Echocardiogram Complete.  02/08/2014   Ercia Crisafulli [place], RDCS

## 2014-02-12 ENCOUNTER — Other Ambulatory Visit: Payer: Self-pay

## 2014-02-12 DIAGNOSIS — R9431 Abnormal electrocardiogram [ECG] [EKG]: Secondary | ICD-10-CM

## 2014-02-13 ENCOUNTER — Encounter: Payer: Medicare Other | Admitting: Cardiovascular Disease

## 2014-02-15 ENCOUNTER — Ambulatory Visit (INDEPENDENT_AMBULATORY_CARE_PROVIDER_SITE_OTHER): Payer: Medicare Other | Admitting: Family Medicine

## 2014-02-15 ENCOUNTER — Encounter: Payer: Self-pay | Admitting: Family Medicine

## 2014-02-15 VITALS — BP 161/77 | HR 59 | Temp 97.8°F | Ht 66.75 in | Wt 165.2 lb

## 2014-02-15 DIAGNOSIS — E785 Hyperlipidemia, unspecified: Secondary | ICD-10-CM

## 2014-02-15 DIAGNOSIS — I10 Essential (primary) hypertension: Secondary | ICD-10-CM

## 2014-02-15 DIAGNOSIS — N289 Disorder of kidney and ureter, unspecified: Secondary | ICD-10-CM

## 2014-02-15 DIAGNOSIS — R809 Proteinuria, unspecified: Secondary | ICD-10-CM

## 2014-02-15 DIAGNOSIS — Z0000 Encounter for general adult medical examination without abnormal findings: Secondary | ICD-10-CM

## 2014-02-15 DIAGNOSIS — E663 Overweight: Secondary | ICD-10-CM

## 2014-02-15 DIAGNOSIS — M8580 Other specified disorders of bone density and structure, unspecified site: Secondary | ICD-10-CM

## 2014-02-15 MED ORDER — ATENOLOL 25 MG PO TABS: ORAL_TABLET | ORAL | Status: DC

## 2014-02-15 MED ORDER — AMLODIPINE BESYLATE 10 MG PO TABS: 10.0000 mg | ORAL_TABLET | Freq: Every day | ORAL | Status: DC

## 2014-02-15 MED ORDER — HYDRALAZINE HCL 10 MG PO TABS: 10.0000 mg | ORAL_TABLET | Freq: Two times a day (BID) | ORAL | Status: DC

## 2014-02-15 NOTE — Assessment & Plan Note (Signed)
Well controlled, no changes to meds. Encouraged heart healthy diet such as the DASH diet and exercise as tolerated.  °

## 2014-02-15 NOTE — Progress Notes (Signed)
Pre visit review using our clinic review tool, if applicable. No additional management support is needed unless otherwise documented below in the visit note. 

## 2014-02-15 NOTE — Patient Instructions (Signed)
Check with insurance and see if they pay for Prevnar PCV13  Or PCV 23    Preventive Care for Adults A healthy lifestyle and preventive care can promote health and wellness. Preventive health guidelines for women include the following key practices.  A routine yearly physical is a good way to check with your health care provider about your health and preventive screening. It is a chance to share any concerns and updates on your health and to receive a thorough exam.  Visit your dentist for a routine exam and preventive care every 6 months. Brush your teeth twice a day and floss once a day. Good oral hygiene prevents tooth decay and gum disease.  The frequency of eye exams is based on your age, health, family medical history, use of contact lenses, and other factors. Follow your health care provider's recommendations for frequency of eye exams.  Eat a healthy diet. Foods like vegetables, fruits, whole grains, low-fat dairy products, and lean protein foods contain the nutrients you need without too many calories. Decrease your intake of foods high in solid fats, added sugars, and salt. Eat the right amount of calories for you.Get information about a proper diet from your health care provider, if necessary.  Regular physical exercise is one of the most important things you can do for your health. Most adults should get at least 150 minutes of moderate-intensity exercise (any activity that increases your heart rate and causes you to sweat) each week. In addition, most adults need muscle-strengthening exercises on 2 or more days a week.  Maintain a healthy weight. The body mass index (BMI) is a screening tool to identify possible weight problems. It provides an estimate of body fat based on height and weight. Your health care provider can find your BMI and can help you achieve or maintain a healthy weight.For adults 20 years and older:  A BMI below 18.5 is considered underweight.  A BMI of 18.5 to  24.9 is normal.  A BMI of 25 to 29.9 is considered overweight.  A BMI of 30 and above is considered obese.  Maintain normal blood lipids and cholesterol levels by exercising and minimizing your intake of saturated fat. Eat a balanced diet with plenty of fruit and vegetables. Blood tests for lipids and cholesterol should begin at age 34 and be repeated every 5 years. If your lipid or cholesterol levels are high, you are over 50, or you are at high risk for heart disease, you may need your cholesterol levels checked more frequently.Ongoing high lipid and cholesterol levels should be treated with medicines if diet and exercise are not working.  If you smoke, find out from your health care provider how to quit. If you do not use tobacco, do not start.  Lung cancer screening is recommended for adults aged 40-80 years who are at high risk for developing lung cancer because of a history of smoking. A yearly low-dose CT scan of the lungs is recommended for people who have at least a 30-pack-year history of smoking and are a current smoker or have quit within the past 15 years. A pack year of smoking is smoking an average of 1 pack of cigarettes a day for 1 year (for example: 1 pack a day for 30 years or 2 packs a day for 15 years). Yearly screening should continue until the smoker has stopped smoking for at least 15 years. Yearly screening should be stopped for people who develop a health problem that would prevent them  from having lung cancer treatment.  If you are pregnant, do not drink alcohol. If you are breastfeeding, be very cautious about drinking alcohol. If you are not pregnant and choose to drink alcohol, do not have more than 1 drink per day. One drink is considered to be 12 ounces (355 mL) of beer, 5 ounces (148 mL) of wine, or 1.5 ounces (44 mL) of liquor.  Avoid use of street drugs. Do not share needles with anyone. Ask for help if you need support or instructions about stopping the use of  drugs.  High blood pressure causes heart disease and increases the risk of stroke. Your blood pressure should be checked at least every 1 to 2 years. Ongoing high blood pressure should be treated with medicines if weight loss and exercise do not work.  If you are 7-35 years old, ask your health care provider if you should take aspirin to prevent strokes.  Diabetes screening involves taking a blood sample to check your fasting blood sugar level. This should be done once every 3 years, after age 75, if you are within normal weight and without risk factors for diabetes. Testing should be considered at a younger age or be carried out more frequently if you are overweight and have at least 1 risk factor for diabetes.  Breast cancer screening is essential preventive care for women. You should practice "breast self-awareness." This means understanding the normal appearance and feel of your breasts and may include breast self-examination. Any changes detected, no matter how small, should be reported to a health care provider. Women in their 49s and 30s should have a clinical breast exam (CBE) by a health care provider as part of a regular health exam every 1 to 3 years. After age 24, women should have a CBE every year. Starting at age 93, women should consider having a mammogram (breast X-ray test) every year. Women who have a family history of breast cancer should talk to their health care provider about genetic screening. Women at a high risk of breast cancer should talk to their health care providers about having an MRI and a mammogram every year.  Breast cancer gene (BRCA)-related cancer risk assessment is recommended for women who have family members with BRCA-related cancers. BRCA-related cancers include breast, ovarian, tubal, and peritoneal cancers. Having family members with these cancers may be associated with an increased risk for harmful changes (mutations) in the breast cancer genes BRCA1 and BRCA2.  Results of the assessment will determine the need for genetic counseling and BRCA1 and BRCA2 testing.  Routine pelvic exams to screen for cancer are no longer recommended for nonpregnant women who are considered low risk for cancer of the pelvic organs (ovaries, uterus, and vagina) and who do not have symptoms. Ask your health care provider if a screening pelvic exam is right for you.  If you have had past treatment for cervical cancer or a condition that could lead to cancer, you need Pap tests and screening for cancer for at least 20 years after your treatment. If Pap tests have been discontinued, your risk factors (such as having a new sexual partner) need to be reassessed to determine if screening should be resumed. Some women have medical problems that increase the chance of getting cervical cancer. In these cases, your health care provider may recommend more frequent screening and Pap tests.  The HPV test is an additional test that may be used for cervical cancer screening. The HPV test looks for the virus  that can cause the cell changes on the cervix. The cells collected during the Pap test can be tested for HPV. The HPV test could be used to screen women aged 73 years and older, and should be used in women of any age who have unclear Pap test results. After the age of 57, women should have HPV testing at the same frequency as a Pap test.  Colorectal cancer can be detected and often prevented. Most routine colorectal cancer screening begins at the age of 21 years and continues through age 21 years. However, your health care provider may recommend screening at an earlier age if you have risk factors for colon cancer. On a yearly basis, your health care provider may provide home test kits to check for hidden blood in the stool. Use of a small camera at the end of a tube, to directly examine the colon (sigmoidoscopy or colonoscopy), can detect the earliest forms of colorectal cancer. Talk to your health  care provider about this at age 4, when routine screening begins. Direct exam of the colon should be repeated every 5-10 years through age 58 years, unless early forms of pre-cancerous polyps or small growths are found.  People who are at an increased risk for hepatitis B should be screened for this virus. You are considered at high risk for hepatitis B if:  You were born in a country where hepatitis B occurs often. Talk with your health care provider about which countries are considered high risk.  Your parents were born in a high-risk country and you have not received a shot to protect against hepatitis B (hepatitis B vaccine).  You have HIV or AIDS.  You use needles to inject street drugs.  You live with, or have sex with, someone who has hepatitis B.  You get hemodialysis treatment.  You take certain medicines for conditions like cancer, organ transplantation, and autoimmune conditions.  Hepatitis C blood testing is recommended for all people born from [birth year] through 1965 and any individual with known risks for hepatitis C.  Practice safe sex. Use condoms and avoid high-risk sexual practices to reduce the spread of sexually transmitted infections (STIs). STIs include gonorrhea, chlamydia, syphilis, trichomonas, herpes, HPV, and human immunodeficiency virus (HIV). Herpes, HIV, and HPV are viral illnesses that have no cure. They can result in disability, cancer, and death.  You should be screened for sexually transmitted illnesses (STIs) including gonorrhea and chlamydia if:  You are sexually active and are younger than 24 years.  You are older than 24 years and your health care provider tells you that you are at risk for this type of infection.  Your sexual activity has changed since you were last screened and you are at an increased risk for chlamydia or gonorrhea. Ask your health care provider if you are at risk.  If you are at risk of being infected with HIV, it is recommended  that you take a prescription medicine daily to prevent HIV infection. This is called preexposure prophylaxis (PrEP). You are considered at risk if:  You are a heterosexual woman, are sexually active, and are at increased risk for HIV infection.  You take drugs by injection.  You are sexually active with a partner who has HIV.  Talk with your health care provider about whether you are at high risk of being infected with HIV. If you choose to begin PrEP, you should first be tested for HIV. You should then be tested every 3 months for as long  as you are taking PrEP.  Osteoporosis is a disease in which the bones lose minerals and strength with aging. This can result in serious bone fractures or breaks. The risk of osteoporosis can be identified using a bone density scan. Women ages 95 years and over and women at risk for fractures or osteoporosis should discuss screening with their health care providers. Ask your health care provider whether you should take a calcium supplement or vitamin D to reduce the rate of osteoporosis.  Menopause can be associated with physical symptoms and risks. Hormone replacement therapy is available to decrease symptoms and risks. You should talk to your health care provider about whether hormone replacement therapy is right for you.  Use sunscreen. Apply sunscreen liberally and repeatedly throughout the day. You should seek shade when your shadow is shorter than you. Protect yourself by wearing long sleeves, pants, a wide-brimmed hat, and sunglasses year round, whenever you are outdoors.  Once a month, do a whole body skin exam, using a mirror to look at the skin on your back. Tell your health care provider of new moles, moles that have irregular borders, moles that are larger than a pencil eraser, or moles that have changed in shape or color.  Stay current with required vaccines (immunizations).  Influenza vaccine. All adults should be immunized every year.  Tetanus,  diphtheria, and acellular pertussis (Td, Tdap) vaccine. Pregnant women should receive 1 dose of Tdap vaccine during each pregnancy. The dose should be obtained regardless of the length of time since the last dose. Immunization is preferred during the 27th-36th week of gestation. An adult who has not previously received Tdap or who does not know her vaccine status should receive 1 dose of Tdap. This initial dose should be followed by tetanus and diphtheria toxoids (Td) booster doses every 10 years. Adults with an unknown or incomplete history of completing a 3-dose immunization series with Td-containing vaccines should begin or complete a primary immunization series including a Tdap dose. Adults should receive a Td booster every 10 years.  Varicella vaccine. An adult without evidence of immunity to varicella should receive 2 doses or a second dose if she has previously received 1 dose. Pregnant females who do not have evidence of immunity should receive the first dose after pregnancy. This first dose should be obtained before leaving the health care facility. The second dose should be obtained 4-8 weeks after the first dose.  Human papillomavirus (HPV) vaccine. Females aged 13-26 years who have not received the vaccine previously should obtain the 3-dose series. The vaccine is not recommended for use in pregnant females. However, pregnancy testing is not needed before receiving a dose. If a female is found to be pregnant after receiving a dose, no treatment is needed. In that case, the remaining doses should be delayed until after the pregnancy. Immunization is recommended for any person with an immunocompromised condition through the age of 7 years if she did not get any or all doses earlier. During the 3-dose series, the second dose should be obtained 4-8 weeks after the first dose. The third dose should be obtained 24 weeks after the first dose and 16 weeks after the second dose.  Zoster vaccine. One dose  is recommended for adults aged 28 years or older unless certain conditions are present.  Measles, mumps, and rubella (MMR) vaccine. Adults born before [birth year] generally are considered immune to measles and mumps. Adults born in [birth year] or later should have 1 or more doses of  MMR vaccine unless there is a contraindication to the vaccine or there is laboratory evidence of immunity to each of the three diseases. A routine second dose of MMR vaccine should be obtained at least 28 days after the first dose for students attending postsecondary schools, health care workers, or international travelers. People who received inactivated measles vaccine or an unknown type of measles vaccine during 1963-1967 should receive 2 doses of MMR vaccine. People who received inactivated mumps vaccine or an unknown type of mumps vaccine before 1979 and are at high risk for mumps infection should consider immunization with 2 doses of MMR vaccine. For females of childbearing age, rubella immunity should be determined. If there is no evidence of immunity, females who are not pregnant should be vaccinated. If there is no evidence of immunity, females who are pregnant should delay immunization until after pregnancy. Unvaccinated health care workers born before [birth year] who lack laboratory evidence of measles, mumps, or rubella immunity or laboratory confirmation of disease should consider measles and mumps immunization with 2 doses of MMR vaccine or rubella immunization with 1 dose of MMR vaccine.  Pneumococcal 13-valent conjugate (PCV13) vaccine. When indicated, a person who is uncertain of her immunization history and has no record of immunization should receive the PCV13 vaccine. An adult aged 27 years or older who has certain medical conditions and has not been previously immunized should receive 1 dose of PCV13 vaccine. This PCV13 should be followed with a dose of pneumococcal polysaccharide (PPSV23) vaccine. The PPSV23 vaccine dose should be  obtained at least 8 weeks after the dose of PCV13 vaccine. An adult aged 82 years or older who has certain medical conditions and previously received 1 or more doses of PPSV23 vaccine should receive 1 dose of PCV13. The PCV13 vaccine dose should be obtained 1 or more years after the last PPSV23 vaccine dose.  Pneumococcal polysaccharide (PPSV23) vaccine. When PCV13 is also indicated, PCV13 should be obtained first. All adults aged 34 years and older should be immunized. An adult younger than age 35 years who has certain medical conditions should be immunized. Any person who resides in a nursing home or long-term care facility should be immunized. An adult smoker should be immunized. People with an immunocompromised condition and certain other conditions should receive both PCV13 and PPSV23 vaccines. People with human immunodeficiency virus (HIV) infection should be immunized as soon as possible after diagnosis. Immunization during chemotherapy or radiation therapy should be avoided. Routine use of PPSV23 vaccine is not recommended for American Indians, [place] Natives, or people younger than 65 years unless there are medical conditions that require PPSV23 vaccine. When indicated, people who have unknown immunization and have no record of immunization should receive PPSV23 vaccine. One-time revaccination 5 years after the first dose of PPSV23 is recommended for people aged 19-64 years who have chronic kidney failure, nephrotic syndrome, asplenia, or immunocompromised conditions. People who received 1-2 doses of PPSV23 before age 22 years should receive another dose of PPSV23 vaccine at age 73 years or later if at least 5 years have passed since the previous dose. Doses of PPSV23 are not needed for people immunized with PPSV23 at or after age 82 years.  Meningococcal vaccine. Adults with asplenia or persistent complement component deficiencies should receive 2 doses of quadrivalent meningococcal conjugate  (MenACWY-D) vaccine. The doses should be obtained at least 2 months apart. Microbiologists working with certain meningococcal bacteria, [place] recruits, people at risk during an outbreak, and people who travel to or live  in countries with a high rate of meningitis should be immunized. A first-year college student up through age 62 years who is living in a residence hall should receive a dose if she did not receive a dose on or after her 16th birthday. Adults who have certain high-risk conditions should receive one or more doses of vaccine.  Hepatitis A vaccine. Adults who wish to be protected from this disease, have certain high-risk conditions, work with hepatitis A-infected animals, work in hepatitis A research labs, or travel to or work in countries with a high rate of hepatitis A should be immunized. Adults who were previously unvaccinated and who anticipate close contact with an international adoptee during the first 60 days after arrival in the [place] from a country with a high rate of hepatitis A should be immunized.  Hepatitis B vaccine. Adults who wish to be protected from this disease, have certain high-risk conditions, may be exposed to blood or other infectious body fluids, are household contacts or sex partners of hepatitis B positive people, are clients or workers in certain care facilities, or travel to or work in countries with a high rate of hepatitis B should be immunized.  Haemophilus influenzae type b (Hib) vaccine. A previously unvaccinated person with asplenia or sickle cell disease or having a scheduled splenectomy should receive 1 dose of Hib vaccine. Regardless of previous immunization, a recipient of a hematopoietic stem cell transplant should receive a 3-dose series 6-12 months after her successful transplant. Hib vaccine is not recommended for adults with HIV infection. Preventive Services / Frequency Ages 51 to 2 years  Blood pressure check.** / Every 1 to 2  years.  Lipid and cholesterol check.** / Every 5 years beginning at age 66.  Clinical breast exam.** / Every 3 years for women in their 10s and 79s.  BRCA-related cancer risk assessment.** / For women who have family members with a BRCA-related cancer (breast, ovarian, tubal, or peritoneal cancers).  Pap test.** / Every 2 years from ages 88 through 1. Every 3 years starting at age 59 through age 91 or 41 with a history of 3 consecutive normal Pap tests.  HPV screening.** / Every 3 years from ages 36 through ages 54 to 17 with a history of 3 consecutive normal Pap tests.  Hepatitis C blood test.** / For any individual with known risks for hepatitis C.  Skin self-exam. / Monthly.  Influenza vaccine. / Every year.  Tetanus, diphtheria, and acellular pertussis (Tdap, Td) vaccine.** / Consult your health care provider. Pregnant women should receive 1 dose of Tdap vaccine during each pregnancy. 1 dose of Td every 10 years.  Varicella vaccine.** / Consult your health care provider. Pregnant females who do not have evidence of immunity should receive the first dose after pregnancy.  HPV vaccine. / 3 doses over 6 months, if 81 and younger. The vaccine is not recommended for use in pregnant females. However, pregnancy testing is not needed before receiving a dose.  Measles, mumps, rubella (MMR) vaccine.** / You need at least 1 dose of MMR if you were born in 1957 or later. You may also need a 2nd dose. For females of childbearing age, rubella immunity should be determined. If there is no evidence of immunity, females who are not pregnant should be vaccinated. If there is no evidence of immunity, females who are pregnant should delay immunization until after pregnancy.  Pneumococcal 13-valent conjugate (PCV13) vaccine.** / Consult your health care provider.  Pneumococcal polysaccharide (PPSV23) vaccine.** /  1 to 2 doses if you smoke cigarettes or if you have certain conditions.  Meningococcal  vaccine.** / 1 dose if you are age 54 to 27 years and a Market researcher living in a residence hall, or have one of several medical conditions, you need to get vaccinated against meningococcal disease. You may also need additional booster doses.  Hepatitis A vaccine.** / Consult your health care provider.  Hepatitis B vaccine.** / Consult your health care provider.  Haemophilus influenzae type b (Hib) vaccine.** / Consult your health care provider. Ages 56 to 16 years  Blood pressure check.** / Every 1 to 2 years.  Lipid and cholesterol check.** / Every 5 years beginning at age 56 years.  Lung cancer screening. / Every year if you are aged 67-80 years and have a 30-pack-year history of smoking and currently smoke or have quit within the past 15 years. Yearly screening is stopped once you have quit smoking for at least 15 years or develop a health problem that would prevent you from having lung cancer treatment.  Clinical breast exam.** / Every year after age 54 years.  BRCA-related cancer risk assessment.** / For women who have family members with a BRCA-related cancer (breast, ovarian, tubal, or peritoneal cancers).  Mammogram.** / Every year beginning at age 78 years and continuing for as long as you are in good health. Consult with your health care provider.  Pap test.** / Every 3 years starting at age 26 years through age 49 or 64 years with a history of 3 consecutive normal Pap tests.  HPV screening.** / Every 3 years from ages 44 years through ages 75 to 63 years with a history of 3 consecutive normal Pap tests.  Fecal occult blood test (FOBT) of stool. / Every year beginning at age 2 years and continuing until age 71 years. You may not need to do this test if you get a colonoscopy every 10 years.  Flexible sigmoidoscopy or colonoscopy.** / Every 5 years for a flexible sigmoidoscopy or every 10 years for a colonoscopy beginning at age 76 years and continuing until age 68  years.  Hepatitis C blood test.** / For all people born from [birth year] through 1965 and any individual with known risks for hepatitis C.  Skin self-exam. / Monthly.  Influenza vaccine. / Every year.  Tetanus, diphtheria, and acellular pertussis (Tdap/Td) vaccine.** / Consult your health care provider. Pregnant women should receive 1 dose of Tdap vaccine during each pregnancy. 1 dose of Td every 10 years.  Varicella vaccine.** / Consult your health care provider. Pregnant females who do not have evidence of immunity should receive the first dose after pregnancy.  Zoster vaccine.** / 1 dose for adults aged 59 years or older.  Measles, mumps, rubella (MMR) vaccine.** / You need at least 1 dose of MMR if you were born in 1957 or later. You may also need a 2nd dose. For females of childbearing age, rubella immunity should be determined. If there is no evidence of immunity, females who are not pregnant should be vaccinated. If there is no evidence of immunity, females who are pregnant should delay immunization until after pregnancy.  Pneumococcal 13-valent conjugate (PCV13) vaccine.** / Consult your health care provider.  Pneumococcal polysaccharide (PPSV23) vaccine.** / 1 to 2 doses if you smoke cigarettes or if you have certain conditions.  Meningococcal vaccine.** / Consult your health care provider.  Hepatitis A vaccine.** / Consult your health care provider.  Hepatitis B vaccine.** / Consult your  health care provider.  Haemophilus influenzae type b (Hib) vaccine.** / Consult your health care provider. Ages 78 years and over  Blood pressure check.** / Every 1 to 2 years.  Lipid and cholesterol check.** / Every 5 years beginning at age 31 years.  Lung cancer screening. / Every year if you are aged 21-80 years and have a 30-pack-year history of smoking and currently smoke or have quit within the past 15 years. Yearly screening is stopped once you have quit smoking for at least 15 years or  develop a health problem that would prevent you from having lung cancer treatment.  Clinical breast exam.** / Every year after age 18 years.  BRCA-related cancer risk assessment.** / For women who have family members with a BRCA-related cancer (breast, ovarian, tubal, or peritoneal cancers).  Mammogram.** / Every year beginning at age 51 years and continuing for as long as you are in good health. Consult with your health care provider.  Pap test.** / Every 3 years starting at age 17 years through age 48 or 90 years with 3 consecutive normal Pap tests. Testing can be stopped between 65 and 70 years with 3 consecutive normal Pap tests and no abnormal Pap or HPV tests in the past 10 years.  HPV screening.** / Every 3 years from ages 76 years through ages 16 or 36 years with a history of 3 consecutive normal Pap tests. Testing can be stopped between 65 and 70 years with 3 consecutive normal Pap tests and no abnormal Pap or HPV tests in the past 10 years.  Fecal occult blood test (FOBT) of stool. / Every year beginning at age 41 years and continuing until age 4 years. You may not need to do this test if you get a colonoscopy every 10 years.  Flexible sigmoidoscopy or colonoscopy.** / Every 5 years for a flexible sigmoidoscopy or every 10 years for a colonoscopy beginning at age 20 years and continuing until age 62 years.  Hepatitis C blood test.** / For all people born from [birth year] through 1965 and any individual with known risks for hepatitis C.  Osteoporosis screening.** / A one-time screening for women ages 66 years and over and women at risk for fractures or osteoporosis.  Skin self-exam. / Monthly.  Influenza vaccine. / Every year.  Tetanus, diphtheria, and acellular pertussis (Tdap/Td) vaccine.** / 1 dose of Td every 10 years.  Varicella vaccine.** / Consult your health care provider.  Zoster vaccine.** / 1 dose for adults aged 44 years or older.  Pneumococcal 13-valent conjugate  (PCV13) vaccine.** / Consult your health care provider.  Pneumococcal polysaccharide (PPSV23) vaccine.** / 1 dose for all adults aged 77 years and older.  Meningococcal vaccine.** / Consult your health care provider.  Hepatitis A vaccine.** / Consult your health care provider.  Hepatitis B vaccine.** / Consult your health care provider.  Haemophilus influenzae type b (Hib) vaccine.** / Consult your health care provider. ** Family history and personal history of risk and conditions may change your health care provider's recommendations. Document Released: 03/17/2001 Document Revised: 06/05/2013 Document Reviewed: 06/16/2010 [facility] Patient Information 2015 [place], [place]. This information is not intended to replace advice given to you by your health care provider. Make sure you discuss any questions you have with your health care provider.

## 2014-02-16 LAB — LIPID PANEL
CHOLESTEROL: 225 mg/dL — AB (ref 0–200)
HDL: 98.5 mg/dL (ref >39.00)
LDL CALC: 105 mg/dL — AB (ref 0–99)
NONHDL: 126.5
Total CHOL/HDL Ratio: 2
Triglycerides: 107 mg/dL (ref 0.0–149.0)
VLDL: 21.4 mg/dL (ref 0.0–40.0)

## 2014-02-16 LAB — URINALYSIS, ROUTINE W REFLEX MICROSCOPIC
BILIRUBIN URINE: NEGATIVE
Hgb urine dipstick: NEGATIVE
Ketones, ur: NEGATIVE
Leukocytes, UA: NEGATIVE
NITRITE: NEGATIVE
Specific Gravity, Urine: 1.015 (ref 1.000–1.030)
Total Protein, Urine: 100 — AB
Urine Glucose: NEGATIVE
Urobilinogen, UA: 0.2 (ref 0.0–1.0)
pH: 6.5 (ref 5.0–8.0)

## 2014-02-16 LAB — COMPREHENSIVE METABOLIC PANEL
ALBUMIN: 4 g/dL (ref 3.5–5.2)
ALT: 22 U/L (ref 0–35)
AST: 38 U/L — ABNORMAL HIGH (ref 0–37)
Alkaline Phosphatase: 55 U/L (ref 39–117)
BUN: 51 mg/dL — ABNORMAL HIGH (ref 6–23)
CALCIUM: 11.1 mg/dL — AB (ref 8.4–10.5)
CO2: 28 meq/L (ref 19–32)
Chloride: 103 mEq/L (ref 96–112)
Creatinine, Ser: 1.96 mg/dL — ABNORMAL HIGH (ref 0.40–1.20)
GFR: 26.94 mL/min — ABNORMAL LOW (ref >60.00)
GLUCOSE: 91 mg/dL (ref 70–99)
Potassium: 5 mEq/L (ref 3.5–5.1)
Sodium: 137 mEq/L (ref 135–145)
Total Bilirubin: 0.4 mg/dL (ref 0.2–1.2)
Total Protein: 7.4 g/dL (ref 6.0–8.3)

## 2014-02-16 LAB — TSH: TSH: 0.58 u[IU]/mL (ref 0.35–4.50)

## 2014-02-16 LAB — CBC
HEMATOCRIT: 37.2 % (ref 36.0–46.0)
Hemoglobin: 12.2 g/dL (ref 12.0–15.0)
MCHC: 32.7 g/dL (ref 30.0–36.0)
MCV: 85.2 fl (ref 78.0–100.0)
Platelets: 297 10*3/uL (ref 150.0–400.0)
RBC: 4.36 Mil/uL (ref 3.87–5.11)
RDW: 14.9 % (ref 11.5–15.5)
WBC: 8 10*3/uL (ref 4.0–10.5)

## 2014-02-16 LAB — VITAMIN D 25 HYDROXY (VIT D DEFICIENCY, FRACTURES): VITD: 37.22 ng/mL (ref 30.00–100.00)

## 2014-02-18 ENCOUNTER — Encounter: Payer: Self-pay | Admitting: Family Medicine

## 2014-02-18 DIAGNOSIS — R809 Proteinuria, unspecified: Secondary | ICD-10-CM | POA: Insufficient documentation

## 2014-02-18 HISTORY — DX: Hypercalcemia: E83.52

## 2014-02-18 HISTORY — DX: Proteinuria, unspecified: R80.9

## 2014-02-18 NOTE — Assessment & Plan Note (Addendum)
Patient encouraged to maintain heart healthy diet, regular exercise, adequate sleep. Consider daily probiotics. Take medications as prescribed. Patient denies any difficulties at home. No trouble with ADLs, depression or falls. No recent changes to vision or hearing. Is UTD with immunizations. Is UTD with screening. Discussed Advanced Directives, patient agrees to bring [place] copies of documents if can. Follows with Dr Carlean Purl of gastroenterology, colonoscopy in 2014 Dr Percival Spanish of cardiology Dr Sabra Heck of opthamology Pap UTD MGM UTD Labs ordered and reviewed.

## 2014-02-18 NOTE — Assessment & Plan Note (Signed)
Poorly controlled will alter medications, encouraged DASH diet, minimize caffeine and obtain adequate sleep. Report concerning symptoms and follow up as directed and as needed.  Start Hydralazine

## 2014-02-18 NOTE — Progress Notes (Signed)
Jillian Hayes  [id number] 1945-10-15 02/18/2014      Progress Note-Follow Up  Subjective  Chief Complaint  Chief Complaint  Patient presents with  . Annual Exam    HPI  Patient is a 69 y.o. female in today for routine medical care. Patient presents today for annual exam. Reports blood pressures have been very labile. Running from the 130s up to the 190s over 60s to 80s. She has trouble with fatigue and palpitations. She's had some episodes of feeling lightheaded and dizzy when she was on clonidine. No recent illness. Denies CP/palp/SOB/HA/congestion/fevers/GI or GU c/o. Taking meds as prescribed  Past Medical History  Diagnosis Date  . Allergy     seasonal  . Anemia     iron deficiency  . Depression 1991    hospitalized  . Anxiety   . Hypertension   . Insomnia 06/24/2010  . Overweight(278.02) 06/24/2010  . Fatigue 06/24/2010  . Chronic headaches 06/24/2010  . Multiple chemical sensitivity syndrome 06/25/2010  . History of chicken pox 06/25/2010  . Bladder polyps 06/25/2010  . Renal insufficiency 03/26/2011  . Benign fundic gland polyps of stomach   . Proteinuria 02/18/2014  . Hypercalcemia 02/18/2014  . Medicare annual wellness visit, subsequent 11/09/2011    Past Surgical History  Procedure Laterality Date  . Polyps on bladder removed  1972    benign  . Cyst on left breast removed Left 1991    benign  . Nasal septum surgery  1986    rhinoplasty  . Tonsillectomy  1962    Family History  Problem Relation Age of Onset  . Breast cancer Mother 65    left breast removed, 2010 lung  . Nephrolithiasis Father   . Heart attack Father 44    X 3  . Heart disease Father     smoker  . Hypertension Brother   . Melanoma Son 37    melanoma on leg removed  . Pancreatic cancer Maternal Grandmother   . Hypertension Paternal Grandmother   . Obesity Paternal Grandmother   . Diabetes Mother   . Diabetes Maternal Aunt     x 2  . Diabetes Maternal Uncle     x 3  . Heart  attack Paternal Grandfather 38    History   Social History  . Marital Status: Married    Spouse Name: N/A    Number of Children: 2  . Years of Education: N/A   Occupational History  . retired    Social History Main Topics  . Smoking status: Never Smoker   . Smokeless tobacco: Never Used  . Alcohol Use: No  . Drug Use: No  . Sexual Activity:    Partners: Male     Comment: lives with husband, wears with husband   Other Topics Concern  . Not on file   Social History Narrative   Lives with husband.      Current Outpatient Prescriptions on File Prior to Visit  Medication Sig Dispense Refill  . ALFALFA PO Take 4,500 mg by mouth daily.      Marland Kitchen b complex vitamins capsule Take 1 capsule by mouth daily.    . Calcium Carbonate-Vitamin D (CALCIUM + D PO) Take 1,000 mg by mouth daily.      . cholecalciferol (VITAMIN D) 1000 UNITS tablet Take 1,000 Units by mouth daily.      . Ferrous Fumarate (IRON) 18 MG TBCR Take 1 tablet by mouth daily.      . Flaxseed, Linseed, (  FLAX SEED OIL PO) Take 1 capsule by mouth 3 (three) times daily.    . L-GLUTAMINE PO Take 1 capsule by mouth daily.    Marland Kitchen lisinopril (PRINIVIL,ZESTRIL) 20 MG tablet Take 1 tablet (20 mg total) by mouth 2 (two) times daily. 180 tablet 3  . Probiotic Product (PROBIOTIC DAILY PO) Take 1 tablet by mouth daily.    . vitamin C (ASCORBIC ACID) 500 MG tablet Take 500 mg by mouth daily as needed.      No current facility-administered medications on file prior to visit.    Allergies  Allergen Reactions  . Sulfa Antibiotics Nausea And Vomiting    Review of Systems  Review of Systems  Constitutional: Positive for malaise/fatigue. Negative for fever and chills.  HENT: Negative for congestion, hearing loss and nosebleeds.   Eyes: Negative for discharge.  Respiratory: Negative for cough, sputum production, shortness of breath and wheezing.   Cardiovascular: Negative for chest pain, palpitations and leg swelling.    Gastrointestinal: Negative for heartburn, nausea, vomiting, abdominal pain, diarrhea, constipation and blood in stool.  Genitourinary: Negative for dysuria, urgency, frequency and hematuria.  Musculoskeletal: Negative for myalgias, back pain and falls.  Skin: Negative for rash.  Neurological: Negative for dizziness, tremors, sensory change, focal weakness, loss of consciousness, weakness and headaches.  Endo/Heme/Allergies: Negative for polydipsia. Does not bruise/bleed easily.  Psychiatric/Behavioral: Negative for depression and suicidal ideas. The patient is not nervous/anxious and does not have insomnia.     Objective  BP 161/77 mmHg  Pulse 59  Temp(Src) 97.8 F (36.6 C) (Oral)  Ht 5' 6.75" (1.695 m)  Wt 165 lb 3.2 oz (74.934 kg)  BMI 26.08 kg/m2  SpO2 98%  Physical Exam  Physical Exam  Constitutional: She is oriented to person, place, and time and well-developed, well-nourished, and in no distress. No distress.  HENT:  Head: Normocephalic and atraumatic.  Right Ear: External ear normal.  Left Ear: External ear normal.  Nose: Nose normal.  Mouth/Throat: Oropharynx is clear and moist. No oropharyngeal exudate.  Eyes: Conjunctivae are normal. Pupils are equal, round, and reactive to light. Right eye exhibits no discharge. Left eye exhibits no discharge. No scleral icterus.  Neck: Normal range of motion. Neck supple. No thyromegaly present.  Cardiovascular: Normal rate, regular rhythm, normal heart sounds and intact distal pulses.   No murmur heard. Pulmonary/Chest: Effort normal and breath sounds normal. No respiratory distress. She has no wheezes. She has no rales.  Abdominal: Soft. Bowel sounds are normal. She exhibits no distension and no mass. There is no tenderness.  Musculoskeletal: Normal range of motion. She exhibits no edema or tenderness.  Lymphadenopathy:    She has no cervical adenopathy.  Neurological: She is alert and oriented to person, place, and time. She  has normal reflexes. No cranial nerve deficit. Coordination normal.  Skin: Skin is warm and dry. No rash noted. She is not diaphoretic.  Psychiatric: Mood, memory and affect normal.    Lab Results  Component Value Date   TSH 0.58 02/15/2014   Lab Results  Component Value Date   WBC 8.0 02/15/2014   HGB 12.2 02/15/2014   HCT 37.2 02/15/2014   MCV 85.2 02/15/2014   PLT 297.0 02/15/2014   Lab Results  Component Value Date   CREATININE 1.96* 02/15/2014   BUN 51* 02/15/2014   NA 137 02/15/2014   K 5.0 02/15/2014   CL 103 02/15/2014   CO2 28 02/15/2014   Lab Results  Component Value Date  ALT 22 02/15/2014   AST 38* 02/15/2014   ALKPHOS 55 02/15/2014   BILITOT 0.4 02/15/2014   Lab Results  Component Value Date   CHOL 225* 02/15/2014   Lab Results  Component Value Date   HDL 98.50 02/15/2014   Lab Results  Component Value Date   LDLCALC 105* 02/15/2014   Lab Results  Component Value Date   TRIG 107.0 02/15/2014   Lab Results  Component Value Date   CHOLHDL 2 02/15/2014     Assessment & Plan  Hypertension Well controlled, no changes to meds. Encouraged heart healthy diet such as the DASH diet and exercise as tolerated.    HTN (hypertension) Poorly controlled will alter medications, encouraged DASH diet, minimize caffeine and obtain adequate sleep. Report concerning symptoms and follow up as directed and as needed.  Start Hydralazine   Renal insufficiency Creatinine up slightly. Encouraged increased hydration and we will recheck if still increasing will need to decrease LIsinopril and refer to nephrology   Overweight Encouraged DASH diet, decrease po intake and increase exercise as tolerated. Needs 7-8 hours of sleep nightly. Avoid trans fats, eat small, frequent meals every 4-5 hours with lean proteins, complex carbs and healthy fats. Minimize simple carbs   Hypercalcemia Recheck with PTH if it remains elevated may require referral to  endocrinology.   Proteinuria Will monitor and check labs   Medicare annual wellness visit, subsequent Patient encouraged to maintain heart healthy diet, regular exercise, adequate sleep. Consider daily probiotics. Take medications as prescribed. Patient denies any difficulties at home. No trouble with ADLs, depression or falls. No recent changes to vision or hearing. Is UTD with immunizations. Is UTD with screening. Discussed Advanced Directives, patient agrees to bring [place] copies of documents if can. Follows with Dr Carlean Purl of gastroenterology, colonoscopy in 2014 Dr Percival Spanish of cardiology Dr Sabra Heck of opthamology Pap UTD MGM UTD Labs ordered and reviewed.

## 2014-02-18 NOTE — Assessment & Plan Note (Signed)
Creatinine up slightly. Encouraged increased hydration and we will recheck if still increasing will need to decrease LIsinopril and refer to nephrology

## 2014-02-18 NOTE — Assessment & Plan Note (Signed)
Will monitor and check labs

## 2014-02-18 NOTE — Assessment & Plan Note (Signed)
Encouraged DASH diet, decrease po intake and increase exercise as tolerated. Needs 7-8 hours of sleep nightly. Avoid trans fats, eat small, frequent meals every 4-5 hours with lean proteins, complex carbs and healthy fats. Minimize simple carbs 

## 2014-02-18 NOTE — Assessment & Plan Note (Signed)
Recheck with PTH if it remains elevated may require referral to endocrinology.

## 2014-02-19 ENCOUNTER — Telehealth: Payer: Self-pay | Admitting: *Deleted

## 2014-02-19 DIAGNOSIS — R809 Proteinuria, unspecified: Secondary | ICD-10-CM

## 2014-02-19 DIAGNOSIS — N289 Disorder of kidney and ureter, unspecified: Secondary | ICD-10-CM

## 2014-02-19 NOTE — Telephone Encounter (Signed)
Called and spoke with the pt and informed her of recent lab results and note below.  Pt verbalized understanding.  Pt was scheduled for lab appt on (Wed-02/21/14 @ 10:00am).  Future labs ordered and sent.//AB/CMA

## 2014-02-19 NOTE — Telephone Encounter (Signed)
-----   Message from [name], MD sent at 02/18/2014  9:35 AM EST ----- Notify calcium up so need to check PTH for hypercalcemia. Protein in urine so need to check UPEP and SPEP and renal function worsening so she needs to drink extra water and we will recheck a CMP when she comes back in, if still up will need referral to nephrology. Cholesterol up some but will not require meds yet. Avoid simple carbs and saturated and trans fats.

## 2014-02-21 ENCOUNTER — Other Ambulatory Visit (INDEPENDENT_AMBULATORY_CARE_PROVIDER_SITE_OTHER): Payer: Medicare Other

## 2014-02-21 DIAGNOSIS — N289 Disorder of kidney and ureter, unspecified: Secondary | ICD-10-CM

## 2014-02-21 DIAGNOSIS — R809 Proteinuria, unspecified: Secondary | ICD-10-CM

## 2014-02-22 LAB — PTH, INTACT AND CALCIUM
Calcium: 10.3 mg/dL (ref 8.4–10.5)
PTH: 36 pg/mL (ref 14–64)

## 2014-02-23 LAB — PROTEIN ELECTROPHORESIS, SERUM
ALPHA-1-GLOBULIN: 5.3 % — AB (ref 2.9–4.9)
Albumin ELP: 56.4 % (ref 55.8–66.1)
Alpha-2-Globulin: 12.8 % — ABNORMAL HIGH (ref 7.1–11.8)
Beta 2: 5 % (ref 3.2–6.5)
Beta Globulin: 6.7 % (ref 4.7–7.2)
GAMMA GLOBULIN: 13.8 % (ref 11.1–18.8)
TOTAL PROTEIN, SERUM ELECTROPHOR: 6.1 g/dL (ref 6.0–8.3)

## 2014-02-23 LAB — PROTEIN ELECTROPHORESIS, URINE REFLEX
ALBUMIN UR 24 HR ELECTRO: 74.8 %
ALPHA-1-GLOBULIN, U: 11 %
Alpha-2-Globulin, U: 5.2 %
Beta Globulin, U: 5.5 %
GAMMA GLOBULIN, U: 3.5 %
TOTAL PROTEIN, URINE: 111 mg/dL

## 2014-02-27 ENCOUNTER — Telehealth (HOSPITAL_COMMUNITY): Payer: Self-pay

## 2014-02-27 NOTE — Telephone Encounter (Signed)
Encounter complete. 

## 2014-03-01 ENCOUNTER — Ambulatory Visit (HOSPITAL_COMMUNITY)
Admission: RE | Admit: 2014-03-01 | Discharge: 2014-03-01 | Disposition: A | Discharge status: Home / Self Care | Payer: Medicare Other | Source: Ambulatory Visit | Attending: Cardiovascular Disease | Admitting: Cardiovascular Disease

## 2014-03-01 DIAGNOSIS — R5383 Other fatigue: Secondary | ICD-10-CM | POA: Diagnosis present

## 2014-03-01 DIAGNOSIS — R9431 Abnormal electrocardiogram [ECG] [EKG]: Secondary | ICD-10-CM

## 2014-03-01 NOTE — Procedures (Signed)
Exercise Treadmill Test  Pre-Exercise Testing Evaluation  NSR, prominent voltage criteria for LVH, nonspecific ST changes  Test  Exercise Tolerance Test Ordering MD: Marijo File, MD    Unique Test No: 1  Treadmill:  1  Indication for ETT: Fatigue  Contraindication to ETT: No   Stress Modality: exercise - treadmill  Cardiac Imaging Performed: non   Protocol: standard Bruce - maximal  Max BP:  213/69  Max MPHR (bpm):  152 85% MPR (bpm):  129  MPHR obtained (bpm):  141 % MPHR obtained:  92  Reached 85% MPHR (min:sec):  3:00 Total Exercise Time (min-sec):  4  Workload in METS:  5.8 Borg Scale: 15  Reason ETT Terminated:  Fatigue and SOB PT. OK TO DC PER DR. Sallyanne Kuster, TO SEE [facility]   ST Segment Analysis At Rest: non-specific ST segment slurring With Exercise: significant ischemic ST depression, 2-2.5 mm horizontal depression in V5-V^ and all inferior leads  Other Information Arrhythmia:  PVCs, rare Angina during ETT:  absent (0) Quality of ETT:  diagnostic  ETT Interpretation:  abnormal - evidence of ST depression consistent with ischemia  Comments: Poor exercise tolerance. Hypertensive response to exercise.  Recommendations: Further evaluation for coronary insufficiency versus hypertension related LVH  Sanda Klein, MD, [facility] [phone] office [phone] pager

## 2014-03-08 ENCOUNTER — Encounter: Payer: Self-pay | Admitting: Cardiology

## 2014-03-08 ENCOUNTER — Ambulatory Visit (INDEPENDENT_AMBULATORY_CARE_PROVIDER_SITE_OTHER): Payer: Medicare Other | Admitting: Cardiology

## 2014-03-08 VITALS — BP 112/62 | HR 65 | Ht 67.0 in | Wt 166.4 lb

## 2014-03-08 DIAGNOSIS — R9439 Abnormal result of other cardiovascular function study: Secondary | ICD-10-CM

## 2014-03-08 MED ORDER — HYDRALAZINE HCL 10 MG PO TABS: 10.0000 mg | ORAL_TABLET | Freq: Three times a day (TID) | ORAL | Status: DC

## 2014-03-08 NOTE — Patient Instructions (Signed)
Your physician recommends that you schedule a follow-up appointment in: 2 months with Dr. Percival Spanish  We have increased your Hydralazine to three times a day

## 2014-03-08 NOTE — Progress Notes (Signed)
HPI The patient presents for follow up of hypertension.   This is her second visit.  Prior to the last visit she had BPs of  190/78 prior to have amlodipine increased and clonidine started. Her clonidine was stopped when she became hypotensive.  Because of some renal insufficiency I sent her for renal Dopplers which demonstrated no stenosis but there was a cyst.  She did have an abnormal EKG but echocardiography was unremarkable with no wall motion abnormalities.  She did have a POET (Plain Old Exercise Treadmill) which suggested some evidence of ischemia. However, she also had a hypertensive blood pressure response. I brought her back today to talk about all of this.  She reports that she has no acute cardiovascular symptoms. She's able to do walking in her neighborhood without chest pressure, neck or arm discomfort. She doesn't have any palpitations, presyncope or syncope. She has no shortness of breath, PND or orthopnea. She has no weight gain or edema.  Allergies  Allergen Reactions  . Sulfa Antibiotics Nausea And Vomiting    Current Outpatient Prescriptions  Medication Sig Dispense Refill  . ALFALFA PO Take 4,500 mg by mouth daily.      Marland Kitchen amLODipine (NORVASC) 10 MG tablet Take 1 tablet (10 mg total) by mouth daily. 90 tablet 1  . atenolol (TENORMIN) 25 MG tablet TAKE 1 BY MOUTH DAILY 90 tablet 1  . b complex vitamins capsule Take 1 capsule by mouth daily.    . Calcium Carbonate-Vitamin D (CALCIUM + D PO) Take 1,000 mg by mouth daily.      . cholecalciferol (VITAMIN D) 1000 UNITS tablet Take 1,000 Units by mouth daily.      . Ferrous Fumarate (IRON) 18 MG TBCR Take 1 tablet by mouth daily.      . Flaxseed, Linseed, (FLAX SEED OIL PO) Take 1 capsule by mouth 3 (three) times daily.    . hydrALAZINE (APRESOLINE) 10 MG tablet Take 1 tablet (10 mg total) by mouth 2 (two) times daily. 60 tablet 3  . L-GLUTAMINE PO Take 1 capsule by mouth daily.    Marland Kitchen lisinopril (PRINIVIL,ZESTRIL) 20 MG tablet  Take 1 tablet (20 mg total) by mouth 2 (two) times daily. 180 tablet 3  . Probiotic Product (PROBIOTIC DAILY PO) Take 1 tablet by mouth daily.    . vitamin C (ASCORBIC ACID) 500 MG tablet Take 500 mg by mouth daily as needed.      No current facility-administered medications for this visit.    Past Medical History  Diagnosis Date  . Allergy     seasonal  . Anemia     iron deficiency  . Depression 1991    hospitalized  . Anxiety   . Hypertension   . Insomnia 06/24/2010  . Overweight(278.02) 06/24/2010  . Fatigue 06/24/2010  . Chronic headaches 06/24/2010  . Multiple chemical sensitivity syndrome 06/25/2010  . History of chicken pox 06/25/2010  . Bladder polyps 06/25/2010  . Renal insufficiency 03/26/2011  . Benign fundic gland polyps of stomach   . Proteinuria 02/18/2014  . Hypercalcemia 02/18/2014  . Medicare annual wellness visit, subsequent 11/09/2011    Past Surgical History  Procedure Laterality Date  . Polyps on bladder removed  1972    benign  . Cyst on left breast removed Left 1991    benign  . Nasal septum surgery  1986    rhinoplasty  . Tonsillectomy  1962     ROS:   As stated in the HPI and negative  for all other systems.   PHYSICAL EXAM BP 112/62 mmHg  Pulse 65  Ht 5\' 7"  (1.702 m)  Wt 166 lb 6.4 oz (75.479 kg)  BMI 26.06 kg/m2  GENERAL:  Well appearing NECK:  No jugular venous distention, waveform within normal limits, carotid upstroke brisk and symmetric, no bruits, no thyromegaly LUNGS:  Clear to auscultation bilaterally CHEST:  Unremarkable HEART:  PMI not displaced or sustained,S1 and S2 within normal limits, no S3, no S4, no clicks, no rubs, soft brief apical systolic murmur, no diastolic murmurs ABD:  Flat, positive bowel sounds normal in frequency in pitch, no bruits, no rebound, no guarding, no midline pulsatile mass, no hepatomegaly, no splenomegaly EXT:  2 plus pulses throughout, no edema, no cyanosis no clubbing SKIN:  No rashes no  nodules   ASSESSMENT AND PLAN  HTN: Her blood pressure is better but I suspect it is elevated when she is doing minimal exercise based on her response during her treadmill. I will increase her hydralazine to 3 times daily.  ABNORMAL EKG:  I suspect this is related to LVH. However, given the abnormal POET (Plain Old Exercise Treadmill) she will need to screen with a Lexiscan Myoview.  CKD:  This is followed by Dr. Randel Pigg.

## 2014-03-12 ENCOUNTER — Other Ambulatory Visit: Payer: Self-pay

## 2014-03-12 ENCOUNTER — Telehealth: Payer: Self-pay | Admitting: Cardiology

## 2014-03-12 MED ORDER — HYDRALAZINE HCL 10 MG PO TABS: 10.0000 mg | ORAL_TABLET | Freq: Three times a day (TID) | ORAL | Status: DC

## 2014-03-12 NOTE — Telephone Encounter (Signed)
Pt wants her prescription for Hydralazine for 3 months instead of 1 month. Please call this to [phone].Please let her know when you have called this in.

## 2014-03-12 NOTE — Telephone Encounter (Signed)
Rx(s) sent to pharmacy electronically.  

## 2014-03-14 ENCOUNTER — Telehealth (HOSPITAL_COMMUNITY): Payer: Self-pay

## 2014-03-14 NOTE — Telephone Encounter (Signed)
Encounter complete. 

## 2014-03-16 ENCOUNTER — Ambulatory Visit (HOSPITAL_COMMUNITY)
Admission: RE | Admit: 2014-03-16 | Discharge: 2014-03-16 | Disposition: A | Discharge status: Home / Self Care | Payer: Medicare Other | Source: Ambulatory Visit | Attending: Cardiovascular Disease | Admitting: Cardiovascular Disease

## 2014-03-16 DIAGNOSIS — Z8249 Family history of ischemic heart disease and other diseases of the circulatory system: Secondary | ICD-10-CM | POA: Insufficient documentation

## 2014-03-16 DIAGNOSIS — I10 Essential (primary) hypertension: Secondary | ICD-10-CM | POA: Insufficient documentation

## 2014-03-16 DIAGNOSIS — R5383 Other fatigue: Secondary | ICD-10-CM | POA: Insufficient documentation

## 2014-03-16 DIAGNOSIS — R9439 Abnormal result of other cardiovascular function study: Secondary | ICD-10-CM

## 2014-03-16 DIAGNOSIS — R002 Palpitations: Secondary | ICD-10-CM | POA: Insufficient documentation

## 2014-03-16 DIAGNOSIS — R9431 Abnormal electrocardiogram [ECG] [EKG]: Secondary | ICD-10-CM | POA: Diagnosis present

## 2014-03-16 MED ORDER — TECHNETIUM TC 99M SESTAMIBI GENERIC - CARDIOLITE
31.0000 | Freq: Once | INTRAVENOUS | Status: AC | PRN
  Administered 2014-03-16: 31 via INTRAVENOUS

## 2014-03-16 MED ORDER — TECHNETIUM TC 99M SESTAMIBI GENERIC - CARDIOLITE
9.9000 | Freq: Once | INTRAVENOUS | Status: AC | PRN
  Administered 2014-03-16: 10 via INTRAVENOUS

## 2014-03-16 MED ORDER — REGADENOSON 0.4 MG/5ML IV SOLN
0.4000 mg | Freq: Once | INTRAVENOUS | Status: AC
  Administered 2014-03-16: 0.4 mg via INTRAVENOUS

## 2014-03-16 MED ORDER — AMINOPHYLLINE 25 MG/ML IV SOLN
75.0000 mg | Freq: Once | INTRAVENOUS | Status: AC
  Administered 2014-03-16: 75 mg via INTRAVENOUS

## 2014-03-16 NOTE — Procedures (Addendum)
[place] [address] [place] [id number] Jillian Hayes  Cardiology Nuclear Med Study  Jillian Hayes is a 69 y.o. female     MRN : [id number]     DOB: 07-30-1945  Procedure Date: 03/16/2014  Nuclear Med Background Indication for Stress Test:  Evaluation for Ischemia, Abnormal EKG and Abnormal ETT on 03/01/2014 History:  No prior cardiac or respiratory history reported;No priro NUC MPI fo rcomparison. Cardiac Risk Factors: Family History - CAD and Hypertension  Symptoms:  Fatigue and Palpitations   Nuclear Pre-Procedure Caffeine/Decaff Intake:  1:00am NPO After: 11am   IV Site: R Forearm  IV 0.9% NS with Angio Cath:  22g  Chest Size (in):  n/a IV Started by: Rolene Course, RN  Height: 5\' 7"  (1.702 m)  Cup Size: A  BMI:  Body mass index is 25.99 kg/(m^2). Weight:  166 lb (75.297 kg)   Tech Comments:  n/a    Nuclear Med Study 1 or 2 day study: 1 day  Stress Test Type:  [place] Provider:  Minus Breeding, MD   Resting Radionuclide: Technetium 57m Sestamibi  Resting Radionuclide Dose: 9.9 mCi   Stress Radionuclide:  Technetium 57m Sestamibi  Stress Radionuclide Dose: 31.0 mCi           Stress Protocol Rest HR: 61 Stress HR: 84  Rest BP: 168/73 Stress BP: 177/67  Exercise Time (min): n/a METS: n/a   Predicted Max HR: 152 bpm % Max HR: 56.58 bpm Rate Pressure Product: 15652  Dose of Adenosine (mg):  n/a Dose of Lexiscan: 0.4 mg  Dose of Atropine (mg): n/a Dose of Dobutamine: n/a mcg/kg/min (at max HR)  Stress Test Technologist: Leane Para, CCT Nuclear Technologist: Imagene Riches, CNMT   Rest Procedure:  Myocardial perfusion imaging was performed at rest 45 minutes following the intravenous administration of Technetium 16m Sestamibi. Stress Procedure:  The patient received IV Lexiscan 0.4 mg over 15-seconds.  Technetium 4m Sestamibi injected IV at 30-seconds.  Patient experienced SOB,  abdominal pressure and late headache, 75 mg Aminophylline IV was administered.  There were no significant changes with Lexiscan.  Quantitative spect images were obtained after a 45 minute delay.  Transient Ischemic Dilatation (Normal <1.22):  1.17  QGS EDV:  110 ml QGS ESV:  39 ml LV Ejection Fraction: 65%       Rest ECG: NSR-LVH  Stress ECG: No significant change from baseline ECG  QPS Raw Data Images:  Normal; no motion artifact; normal heart/lung ratio. Stress Images:  Normal homogeneous uptake in all areas of the myocardium. Rest Images:  Normal homogeneous uptake in all areas of the myocardium. Subtraction (SDS):  Normal  Impression Exercise Capacity:  Lexiscan with no exercise. BP Response:  Normal blood pressure response. Clinical Symptoms:  No significant symptoms noted. ECG Impression:  No significant ST segment change suggestive of ischemia. Comparison with Prior Nuclear Study: No images to compare  Overall Impression:  Normal stress nuclear study.  LV Wall Motion:  NL LV Function; NL Wall Motion   Lorretta Harp, MD  03/16/2014 5:28 PM

## 2014-03-22 ENCOUNTER — Telehealth: Payer: Self-pay | Admitting: Cardiology

## 2014-03-22 NOTE — Telephone Encounter (Signed)
Pt called in stating that she had a stress  test done last week and she would like to know that the results were. Please call  Thanks

## 2014-03-22 NOTE — Telephone Encounter (Signed)
Results discussed w/ patient, she voiced understanding.

## 2014-03-28 ENCOUNTER — Encounter: Payer: Self-pay | Admitting: Physician Assistant

## 2014-03-28 ENCOUNTER — Ambulatory Visit (INDEPENDENT_AMBULATORY_CARE_PROVIDER_SITE_OTHER): Payer: Medicare Other | Admitting: Physician Assistant

## 2014-03-28 VITALS — BP 147/62 | HR 61 | Temp 98.1°F | Resp 16 | Ht 67.0 in | Wt 166.4 lb

## 2014-03-28 DIAGNOSIS — R600 Localized edema: Secondary | ICD-10-CM | POA: Insufficient documentation

## 2014-03-28 NOTE — Assessment & Plan Note (Signed)
Bilateral and mild on examination. Could be multifactorial -- suspect some venous insufficiency and too high of salt intake.  Also her amlodipine and Hydralazine could be the culprit.  Patient wishes to try to cut her amlodipine in half.  BP slightly elevated today.  Instructed her she could attempt this for a week if she follows DASH diet, takes all other medications as directed and checks BP daily.  DASH handout given.  Follow-up in 1 week with PCP. If no improvement, recommend altering BP medications or starting with simple compression stockings.

## 2014-03-28 NOTE — Patient Instructions (Addendum)
Can attempt trial of halving the Amlodipine to 5 mg over the next week.  Continue all other medications as directed.  Limit salt intake and check your BP as directed. Follow-up with Dr. Charlett Blake in 1-1.5 weeks.  DASH Eating Plan DASH stands for "Dietary Approaches to Stop Hypertension." The DASH eating plan is a healthy eating plan that has been shown to reduce high blood pressure (hypertension). Additional health benefits may include reducing the risk of type 2 diabetes mellitus, heart disease, and stroke. The DASH eating plan may also help with weight loss. WHAT DO I NEED TO KNOW ABOUT THE DASH EATING PLAN? For the DASH eating plan, you will follow these general guidelines:  Choose foods with a percent daily value for sodium of less than 5% (as listed on the food label).  Use salt-free seasonings or herbs instead of table salt or sea salt.  Check with your health care provider or pharmacist before using salt substitutes.  Eat lower-sodium products, often labeled as "lower sodium" or "no salt added."  Eat fresh foods.  Eat more vegetables, fruits, and low-fat dairy products.  Choose whole grains. Look for the word "whole" as the first word in the ingredient list.  Choose fish and skinless chicken or [place] more often than red meat. Limit fish, poultry, and meat to 6 oz (170 g) each day.  Limit sweets, desserts, sugars, and sugary drinks.  Choose heart-healthy fats.  Limit cheese to 1 oz (28 g) per day.  Eat more home-cooked food and less restaurant, buffet, and fast food.  Limit fried foods.  Cook foods using methods other than frying.  Limit canned vegetables. If you do use them, rinse them well to decrease the sodium.  When eating at a restaurant, ask that your food be prepared with less salt, or no salt if possible. WHAT FOODS CAN I EAT? Seek help from a dietitian for individual calorie needs. Grains Whole grain or whole wheat bread. Brown rice. Whole grain or whole wheat  pasta. Quinoa, bulgur, and whole grain cereals. Low-sodium cereals. Corn or whole wheat flour tortillas. Whole grain cornbread. Whole grain crackers. Low-sodium crackers. Vegetables Fresh or frozen vegetables (raw, steamed, roasted, or grilled). Low-sodium or reduced-sodium tomato and vegetable juices. Low-sodium or reduced-sodium tomato sauce and paste. Low-sodium or reduced-sodium canned vegetables.  Fruits All fresh, canned (in natural juice), or frozen fruits. Meat and Other Protein Products Ground beef (85% or leaner), grass-fed beef, or beef trimmed of fat. Skinless chicken or [place]. Ground chicken or [place]. Pork trimmed of fat. All fish and seafood. Eggs. Dried beans, peas, or lentils. Unsalted nuts and seeds. Unsalted canned beans. Dairy Low-fat dairy products, such as skim or 1% milk, 2% or reduced-fat cheeses, low-fat ricotta or cottage cheese, or plain low-fat yogurt. Low-sodium or reduced-sodium cheeses. Fats and Oils Tub margarines without trans fats. Light or reduced-fat mayonnaise and salad dressings (reduced sodium). Avocado. Safflower, olive, or canola oils. Natural peanut or almond butter. Other Unsalted popcorn and pretzels. The items listed above may not be a complete list of recommended foods or beverages. Contact your dietitian for more options. WHAT FOODS ARE NOT RECOMMENDED? Grains White bread. White pasta. White rice. Refined cornbread. Bagels and croissants. Crackers that contain trans fat. Vegetables Creamed or fried vegetables. Vegetables in a cheese sauce. Regular canned vegetables. Regular canned tomato sauce and paste. Regular tomato and vegetable juices. Fruits Dried fruits. Canned fruit in light or heavy syrup. Fruit juice. Meat and Other Protein Products Fatty cuts of meat.  Ribs, chicken wings, bacon, sausage, bologna, salami, chitterlings, fatback, hot dogs, bratwurst, and packaged luncheon meats. Salted nuts and seeds. Canned beans with salt. Dairy Whole  or 2% milk, cream, half-and-half, and cream cheese. Whole-fat or sweetened yogurt. Full-fat cheeses or blue cheese. Nondairy creamers and whipped toppings. Processed cheese, cheese spreads, or cheese curds. Condiments Onion and garlic salt, seasoned salt, table salt, and sea salt. Canned and packaged gravies. Worcestershire sauce. Tartar sauce. Barbecue sauce. Teriyaki sauce. Soy sauce, including reduced sodium. Steak sauce. Fish sauce. Oyster sauce. Cocktail sauce. Horseradish. Ketchup and mustard. Meat flavorings and tenderizers. Bouillon cubes. Hot sauce. Tabasco sauce. Marinades. Taco seasonings. Relishes. Fats and Oils Butter, stick margarine, lard, shortening, ghee, and bacon fat. Coconut, palm kernel, or palm oils. Regular salad dressings. Other Pickles and olives. Salted popcorn and pretzels. The items listed above may not be a complete list of foods and beverages to avoid. Contact your dietitian for more information. WHERE CAN I FIND MORE INFORMATION? National Heart, Lung, and Blood Institute: travelstabloid.com Document Released: 01/08/2011 Document Revised: 06/05/2013 Document Reviewed: 11/23/2012 [facility] Patient Information 2015 [place], [place]. This information is not intended to replace advice given to you by your health care provider. Make sure you discuss any questions you have with your health care provider.

## 2014-03-28 NOTE — Progress Notes (Signed)
Patient presents to clinic today c/o intermittent swelling of feet and ankles bilaterally after increase of her amlodipine to 10 mg a month ago.  Is watching salt intake.  Denies trauma or injury.  Endorses her Hydralazine was increased to 10 mg TID a few weeks ago by her Cardiologist. Patient denies chest pain, palpitations, lightheadedness, dizziness, vision changes or frequent headaches.   Past Medical History  Diagnosis Date  . Allergy     seasonal  . Anemia     iron deficiency  . Depression 1991    hospitalized  . Anxiety   . Hypertension   . Insomnia 06/24/2010  . Overweight(278.02) 06/24/2010  . Fatigue 06/24/2010  . Chronic headaches 06/24/2010  . Multiple chemical sensitivity syndrome 06/25/2010  . History of chicken pox 06/25/2010  . Bladder polyps 06/25/2010  . Renal insufficiency 03/26/2011  . Benign fundic gland polyps of stomach   . Proteinuria 02/18/2014  . Hypercalcemia 02/18/2014  . Medicare annual wellness visit, subsequent 11/09/2011    Current Outpatient Prescriptions on File Prior to Visit  Medication Sig Dispense Refill  . ALFALFA PO Take 4,500 mg by mouth daily.      Marland Kitchen amLODipine (NORVASC) 10 MG tablet Take 1 tablet (10 mg total) by mouth daily. 90 tablet 1  . atenolol (TENORMIN) 25 MG tablet TAKE 1 BY MOUTH DAILY 90 tablet 1  . b complex vitamins capsule Take 1 capsule by mouth daily.    . Calcium Carbonate-Vitamin D (CALCIUM + D PO) Take 1,000 mg by mouth daily.      . cholecalciferol (VITAMIN D) 1000 UNITS tablet Take 1,000 Units by mouth daily.      . Ferrous Fumarate (IRON) 18 MG TBCR Take 1 tablet by mouth daily.      . Flaxseed, Linseed, (FLAX SEED OIL PO) Take 1 capsule by mouth 3 (three) times daily.    . hydrALAZINE (APRESOLINE) 10 MG tablet Take 1 tablet (10 mg total) by mouth 3 (three) times daily. 270 tablet 3  . L-GLUTAMINE PO Take 1 capsule by mouth daily.    Marland Kitchen lisinopril (PRINIVIL,ZESTRIL) 20 MG tablet Take 1 tablet (20 mg total) by mouth 2  (two) times daily. 180 tablet 3  . Probiotic Product (PROBIOTIC DAILY PO) Take 1 tablet by mouth daily.    . vitamin C (ASCORBIC ACID) 500 MG tablet Take 500 mg by mouth daily as needed.      No current facility-administered medications on file prior to visit.    Allergies  Allergen Reactions  . Sulfa Antibiotics Nausea And Vomiting    Family History  Problem Relation Age of Onset  . Breast cancer Mother 83    left breast removed, 2010 lung  . Nephrolithiasis Father   . Heart attack Father 66    X 3  . Heart disease Father     smoker  . Hypertension Brother   . Melanoma Son 37    melanoma on leg removed  . Pancreatic cancer Maternal Grandmother   . Hypertension Paternal Grandmother   . Obesity Paternal Grandmother   . Diabetes Mother   . Diabetes Maternal Aunt     x 2  . Diabetes Maternal Uncle     x 3  . Heart attack Paternal Grandfather 56    History   Social History  . Marital Status: Married    Spouse Name: N/A  . Number of Children: 2  . Years of Education: N/A   Occupational History  . retired  Social History Main Topics  . Smoking status: Never Smoker   . Smokeless tobacco: Never Used  . Alcohol Use: No  . Drug Use: No  . Sexual Activity:    Partners: Male     Comment: lives with husband, wears with husband   Other Topics Concern  . None   Social History Narrative   Lives with husband.      Review of Systems - See HPI.  All other ROS are negative.  BP 147/62 mmHg  Pulse 61  Temp(Src) 98.1 F (36.7 C) (Oral)  Resp 16  Ht 5' 7"  (1.702 m)  Wt 166 lb 6 oz (75.467 kg)  BMI 26.05 kg/m2  SpO2 99%  Physical Exam  Constitutional: She is oriented to person, place, and time and well-developed, well-nourished, and in no distress.  HENT:  Head: Normocephalic and atraumatic.  Cardiovascular: Normal rate, regular rhythm, normal heart sounds and intact distal pulses.   Trace pedal edema noted bilaterally.  Pulmonary/Chest: Effort normal and  breath sounds normal. No respiratory distress. She has no wheezes. She has no rales. She exhibits no tenderness.  Neurological: She is alert and oriented to person, place, and time.  Skin: Skin is warm and dry. No rash noted.  Psychiatric: Affect normal.  Vitals reviewed.   Recent Results (from the past 2160 hour(s))  Comp Met (CMET)     Status: Abnormal   Collection Time: 01/10/14  2:48 PM  Result Value Ref Range   Sodium 135 135 - 145 mEq/L   Potassium 4.1 3.5 - 5.1 mEq/L   Chloride 99 96 - 112 mEq/L   CO2 29 19 - 32 mEq/L   Glucose, Bld 87 70 - 99 mg/dL   BUN 39 (H) 6 - 23 mg/dL   Creatinine, Ser 1.5 (H) 0.4 - 1.2 mg/dL   Total Bilirubin 0.5 0.2 - 1.2 mg/dL   Alkaline Phosphatase 54 39 - 117 U/L   AST 45 (H) 0 - 37 U/L   ALT 32 0 - 35 U/L   Total Protein 6.3 6.0 - 8.3 g/dL   Albumin 3.7 3.5 - 5.2 g/dL   Calcium 10.3 8.4 - 10.5 mg/dL   GFR 36.13 (L) >60.00 mL/min  Urinalysis, Routine w reflex microscopic     Status: Abnormal   Collection Time: 01/16/14  7:05 PM  Result Value Ref Range   Color, Urine YELLOW YELLOW   APPearance CLEAR CLEAR   Specific Gravity, Urine 1.010 1.005 - 1.030   pH 6.0 5.0 - 8.0   Glucose, UA NEGATIVE NEGATIVE mg/dL   Hgb urine dipstick TRACE (A) NEGATIVE   Bilirubin Urine NEGATIVE NEGATIVE   Ketones, ur NEGATIVE NEGATIVE mg/dL   Protein, ur >300 (A) NEGATIVE mg/dL   Urobilinogen, UA 0.2 0.0 - 1.0 mg/dL   Nitrite NEGATIVE NEGATIVE   Leukocytes, UA NEGATIVE NEGATIVE  Urine microscopic-add on     Status: Abnormal   Collection Time: 01/16/14  7:05 PM  Result Value Ref Range   Squamous Epithelial / LPF FEW (A) RARE   WBC, UA 0-2 <3 WBC/hpf   RBC / HPF 0-2 <3 RBC/hpf   Bacteria, UA RARE RARE   Casts GRANULAR CAST (A) NEGATIVE  CBC with Differential     Status: Abnormal   Collection Time: 01/16/14  7:45 PM  Result Value Ref Range   WBC 4.9 4.0 - 10.5 K/uL   RBC 4.08 3.87 - 5.11 MIL/uL   Hemoglobin 11.2 (L) 12.0 - 15.0 g/dL   HCT 34.9 (L)  36.0  - 46.0 %   MCV 85.5 78.0 - 100.0 fL   MCH 27.5 26.0 - 34.0 pg   MCHC 32.1 30.0 - 36.0 g/dL   RDW 14.4 11.5 - 15.5 %   Platelets 211 150 - 400 K/uL   Neutrophils Relative % 68 43 - 77 %   Neutro Abs 3.3 1.7 - 7.7 K/uL   Lymphocytes Relative 23 12 - 46 %   Lymphs Abs 1.1 0.7 - 4.0 K/uL   Monocytes Relative 7 3 - 12 %   Monocytes Absolute 0.3 0.1 - 1.0 K/uL   Eosinophils Relative 1 0 - 5 %   Eosinophils Absolute 0.1 0.0 - 0.7 K/uL   Basophils Relative 1 0 - 1 %   Basophils Absolute 0.0 0.0 - 0.1 K/uL  Basic metabolic panel     Status: Abnormal   Collection Time: 01/16/14  7:45 PM  Result Value Ref Range   Sodium 139 137 - 147 mEq/L   Potassium 4.8 3.7 - 5.3 mEq/L   Chloride 102 96 - 112 mEq/L   CO2 24 19 - 32 mEq/L   Glucose, Bld 105 (H) 70 - 99 mg/dL   BUN 51 (H) 6 - 23 mg/dL   Creatinine, Ser 1.87 (H) 0.50 - 1.10 mg/dL   Calcium 10.7 (H) 8.4 - 10.5 mg/dL   GFR calc non Af Amer 27 (L) >90 mL/min   GFR calc Af Amer 31 (L) >90 mL/min    Comment: (NOTE) The eGFR has been calculated using the CKD EPI equation. This calculation has not been validated in all clinical situations. eGFR's persistently <90 mL/min signify possible Chronic Kidney Disease.    Anion gap 13 5 - 15  Troponin I     Status: None   Collection Time: 01/16/14  7:45 PM  Result Value Ref Range   Troponin I <0.30 <0.30 ng/mL    Comment:        Due to the release kinetics of cTnI, a negative result within the first hours of the onset of symptoms does not rule out myocardial infarction with certainty. If myocardial infarction is still suspected, repeat the test at appropriate intervals.   Troponin I     Status: None   Collection Time: 01/16/14 10:56 PM  Result Value Ref Range   Troponin I <0.30 <0.30 ng/mL    Comment:        Due to the release kinetics of cTnI, a negative result within the first hours of the onset of symptoms does not rule out myocardial infarction with certainty. If myocardial  infarction is still suspected, repeat the test at appropriate intervals.   CBC     Status: None   Collection Time: 02/15/14  3:01 PM  Result Value Ref Range   WBC 8.0 4.0 - 10.5 K/uL   RBC 4.36 3.87 - 5.11 Mil/uL   Platelets 297.0 150.0 - 400.0 K/uL   Hemoglobin 12.2 12.0 - 15.0 g/dL   HCT 37.2 36.0 - 46.0 %   MCV 85.2 78.0 - 100.0 fl   MCHC 32.7 30.0 - 36.0 g/dL   RDW 14.9 11.5 - 15.5 %  TSH     Status: None   Collection Time: 02/15/14  3:01 PM  Result Value Ref Range   TSH 0.58 0.35 - 4.50 uIU/mL  Lipid panel     Status: Abnormal   Collection Time: 02/15/14  3:01 PM  Result Value Ref Range   Cholesterol 225 (H) 0 - 200 mg/dL  Comment: ATP III Classification       Desirable:  < 200 mg/dL               Borderline High:  200 - 239 mg/dL          High:  > = 240 mg/dL   Triglycerides 107.0 0.0 - 149.0 mg/dL    Comment: Normal:  <150 mg/dLBorderline High:  150 - 199 mg/dL   HDL 98.50 >39.00 mg/dL   VLDL 21.4 0.0 - 40.0 mg/dL   LDL Cholesterol 105 (H) 0 - 99 mg/dL   Total CHOL/HDL Ratio 2     Comment:                Men          Women1/2 Average Risk     3.4          3.3Average Risk          5.0          4.42X Average Risk          9.6          7.13X Average Risk          15.0          11.0                       NonHDL 126.50     Comment: NOTE:  Non-HDL goal should be 30 mg/dL higher than patient's LDL goal (i.e. LDL goal of < 70 mg/dL, would have non-HDL goal of < 100 mg/dL)  Comprehensive metabolic panel     Status: Abnormal   Collection Time: 02/15/14  3:01 PM  Result Value Ref Range   Sodium 137 135 - 145 mEq/L   Potassium 5.0 3.5 - 5.1 mEq/L   Chloride 103 96 - 112 mEq/L   CO2 28 19 - 32 mEq/L   Glucose, Bld 91 70 - 99 mg/dL   BUN 51 (H) 6 - 23 mg/dL   Creatinine, Ser 1.96 (H) 0.40 - 1.20 mg/dL   Total Bilirubin 0.4 0.2 - 1.2 mg/dL   Alkaline Phosphatase 55 39 - 117 U/L   AST 38 (H) 0 - 37 U/L   ALT 22 0 - 35 U/L   Total Protein 7.4 6.0 - 8.3 g/dL   Albumin 4.0 3.5 -  5.2 g/dL   Calcium 11.1 (H) 8.4 - 10.5 mg/dL   GFR 26.94 (L) >60.00 mL/min  Vitamin D (25 hydroxy)     Status: None   Collection Time: 02/15/14  3:01 PM  Result Value Ref Range   VITD 37.22 30.00 - 100.00 ng/mL  Urinalysis, Routine w reflex microscopic     Status: Abnormal   Collection Time: 02/15/14  3:01 PM  Result Value Ref Range   Color, Urine YELLOW Yellow;Lt. Yellow   APPearance CLEAR Clear   Specific Gravity, Urine 1.015 1.000-1.030   pH 6.5 5.0 - 8.0   Total Protein, Urine 100 (A) Negative   Urine Glucose NEGATIVE Negative   Ketones, ur NEGATIVE Negative   Bilirubin Urine NEGATIVE Negative   Hgb urine dipstick NEGATIVE Negative   Urobilinogen, UA 0.2 0.0 - 1.0   Leukocytes, UA NEGATIVE Negative   Nitrite NEGATIVE Negative   WBC, UA 0-2/hpf 0-2/hpf   RBC / HPF 0-2/hpf 0-2/hpf   Squamous Epithelial / LPF Rare(0-4/hpf) Rare(0-4/hpf)  PTH, Intact and Calcium     Status: None   Collection Time: 02/21/14  9:47 AM  Result Value Ref Range   PTH 36 14 - 64 pg/mL   Calcium 10.3 8.4 - 10.5 mg/dL    Comment:   Interpretive Guide:                              Intact PTH               Calcium                              ----------               ------- Normal Parathyroid           Normal                   Normal Hypoparathyroidism           Low or Low Normal        Low Hyperparathyroidism      Primary                 Normal or High           High      Secondary               High                     Normal or Low      Tertiary                High                     High Non-Parathyroid   Hypercalcemia              Low or Low Normal        High   **Please note change in methodology. If re-baselining is needed, for patients who are being serially tested, please call customer service, within 3 days of collection, to request to add on the appropriate re-baselining test code for the previous methodology, at no charge.**   Protein Electrophoresis, Urine Rflx.     Status: None    Collection Time: 02/21/14  9:47 AM  Result Value Ref Range   Total Protein, Urine 111 mg/dL    Comment: No established reference range.   Total Protein, Urine/Day NOT CALC 50 - 100 mg/day   Albumin 74.8 %   Alpha-1-Globulin, U 11.0 %   Alpha-2-Globulin, U 5.2 %   Beta Globulin, U 5.5 %   Gamma Globulin, U 3.5 %   Monoclonal Band 1 NONE DET %   Monoclonal Band 2 NONE DET %   Interpretation SEE NOTE     Comment: Predominately Albumin.  No significant bands identified. Reviewed by Odis Hollingshead, MD, PhD, FCAP (Electronic Signature on File)   Protein Electrophoresis, (serum)     Status: Abnormal   Collection Time: 02/21/14  9:47 AM  Result Value Ref Range   Total Protein, Serum Electrophoresis 6.1 6.0 - 8.3 g/dL   Albumin ELP 56.4 55.8 - 66.1 %   Alpha-1-Globulin 5.3 (H) 2.9 - 4.9 %   Alpha-2-Globulin 12.8 (H) 7.1 - 11.8 %   Beta Globulin 6.7 4.7 - 7.2 %   Beta 2 5.0 3.2 - 6.5 %   Gamma Globulin 13.8 11.1 - 18.8 %   M-Spike, % NOT DET g/dL   SPE Interp. SEE  NOTE     Comment: Nonspecific pattern, sometimes seen in association with acute inflammatory response pattern. Reviewed by Odis Hollingshead, MD, PhD, FCAP (Electronic Signature on File)    COMMENT (PROTEIN ELECTROPHOR) SEE NOTE     Comment: --------------- Serum protein electrophoresis is a useful screening procedure in the detection of various pathophysiologic states such as inflammation, gammopathies, protein loss and other dysproteinemias.  Immunofixation electrophoresis (IFE) is a more sensitive technique for the identification of M-proteins found in patients with monoclonal gammopathy of unknown significance (MGUS), amyloidosis, early or treated myeloma or macroglobulinemia, solitary plasmacytoma or extramedullary plasmacytoma.     Assessment/Plan: Pedal edema Bilateral and mild on examination. Could be multifactorial -- suspect some venous insufficiency and too high of salt intake.  Also her amlodipine and  Hydralazine could be the culprit.  Patient wishes to try to cut her amlodipine in half.  BP slightly elevated today.  Instructed her she could attempt this for a week if she follows DASH diet, takes all other medications as directed and checks BP daily.  DASH handout given.  Follow-up in 1 week with PCP. If no improvement, recommend altering BP medications or starting with simple compression stockings.

## 2014-04-03 ENCOUNTER — Ambulatory Visit (INDEPENDENT_AMBULATORY_CARE_PROVIDER_SITE_OTHER): Payer: Medicare Other | Admitting: Family Medicine

## 2014-04-03 ENCOUNTER — Encounter: Payer: Self-pay | Admitting: Family Medicine

## 2014-04-03 VITALS — BP 148/78 | HR 64 | Temp 97.9°F | Ht 67.0 in | Wt 168.2 lb

## 2014-04-03 DIAGNOSIS — N289 Disorder of kidney and ureter, unspecified: Secondary | ICD-10-CM

## 2014-04-03 DIAGNOSIS — E663 Overweight: Secondary | ICD-10-CM

## 2014-04-03 DIAGNOSIS — R809 Proteinuria, unspecified: Secondary | ICD-10-CM

## 2014-04-03 DIAGNOSIS — I10 Essential (primary) hypertension: Secondary | ICD-10-CM

## 2014-04-03 DIAGNOSIS — D649 Anemia, unspecified: Secondary | ICD-10-CM

## 2014-04-03 NOTE — Progress Notes (Signed)
Pre visit review using our clinic review tool, if applicable. No additional management support is needed unless otherwise documented below in the visit note. 

## 2014-04-04 ENCOUNTER — Encounter: Payer: Self-pay | Admitting: Family Medicine

## 2014-04-04 NOTE — Assessment & Plan Note (Signed)
resolved 

## 2014-04-04 NOTE — Assessment & Plan Note (Signed)
Will monitor

## 2014-04-04 NOTE — Progress Notes (Signed)
[facility]  [id number] 06/23/1945 04/04/2014      Progress Note-Follow Up  Subjective  Chief Complaint  Chief Complaint  Patient presents with  . Follow-up    hypertension    HPI  Patient is a 69 y.o. female in today for routine medical care. Patient is here today for follow-up. Feels well. No recent illness. Denies any muscle cramps or recent injury. No syncope or pain. No acute concerns. .Denies CP/palp/SOB/HA/congestion/fevers/GI or GU c/o. Taking meds as prescribed  Past Medical History  Diagnosis Date  . Allergy     seasonal  . Anemia     iron deficiency  . Depression 1991    hospitalized  . Anxiety   . Hypertension   . Insomnia 06/24/2010  . Overweight(278.02) 06/24/2010  . Fatigue 06/24/2010  . Chronic headaches 06/24/2010  . Multiple chemical sensitivity syndrome 06/25/2010  . History of chicken pox 06/25/2010  . Bladder polyps 06/25/2010  . Renal insufficiency 03/26/2011  . Benign fundic gland polyps of stomach   . Proteinuria 02/18/2014  . Hypercalcemia 02/18/2014  . Medicare annual wellness visit, subsequent 11/09/2011    Past Surgical History  Procedure Laterality Date  . Polyps on bladder removed  1972    benign  . Cyst on left breast removed Left 1991    benign  . Nasal septum surgery  1986    rhinoplasty  . Tonsillectomy  1962    Family History  Problem Relation Age of Onset  . Breast cancer Mother 66    left breast removed, 2010 lung  . Nephrolithiasis Father   . Heart attack Father 39    X 3  . Heart disease Father     smoker  . Hypertension Brother   . Melanoma Son 37    melanoma on leg removed  . Pancreatic cancer Maternal Grandmother   . Hypertension Paternal Grandmother   . Obesity Paternal Grandmother   . Diabetes Mother   . Diabetes Maternal Aunt     x 2  . Diabetes Maternal Uncle     x 3  . Heart attack Paternal Grandfather 53    History   Social History  . Marital Status: Married    Spouse Name: N/A  . Number of  Children: 2  . Years of Education: N/A   Occupational History  . retired    Social History Main Topics  . Smoking status: Never Smoker   . Smokeless tobacco: Never Used  . Alcohol Use: No  . Drug Use: No  . Sexual Activity:    Partners: Male     Comment: lives with husband, wears with husband   Other Topics Concern  . Not on file   Social History Narrative   Lives with husband.      Current Outpatient Prescriptions on File Prior to Visit  Medication Sig Dispense Refill  . ALFALFA PO Take 4,500 mg by mouth daily.      Marland Kitchen amLODipine (NORVASC) 10 MG tablet Take 1 tablet (10 mg total) by mouth daily. 90 tablet 1  . atenolol (TENORMIN) 25 MG tablet TAKE 1 BY MOUTH DAILY 90 tablet 1  . b complex vitamins capsule Take 1 capsule by mouth daily.    . Calcium Carbonate-Vitamin D (CALCIUM + D PO) Take 1,000 mg by mouth daily.      . cholecalciferol (VITAMIN D) 1000 UNITS tablet Take 1,000 Units by mouth daily.      . Ferrous Fumarate (IRON) 18 MG TBCR Take 1  tablet by mouth daily.      . Flaxseed, Linseed, (FLAX SEED OIL PO) Take 1 capsule by mouth 3 (three) times daily.    . hydrALAZINE (APRESOLINE) 10 MG tablet Take 1 tablet (10 mg total) by mouth 3 (three) times daily. 270 tablet 3  . L-GLUTAMINE PO Take 1 capsule by mouth daily.    Marland Kitchen lisinopril (PRINIVIL,ZESTRIL) 20 MG tablet Take 1 tablet (20 mg total) by mouth 2 (two) times daily. 180 tablet 3  . Probiotic Product (PROBIOTIC DAILY PO) Take 1 tablet by mouth daily.    . vitamin C (ASCORBIC ACID) 500 MG tablet Take 500 mg by mouth daily as needed.      No current facility-administered medications on file prior to visit.    Allergies  Allergen Reactions  . Sulfa Antibiotics Nausea And Vomiting    Review of Systems  Review of Systems  Constitutional: Negative for fever and malaise/fatigue.  HENT: Negative for congestion.   Eyes: Negative for discharge.  Respiratory: Negative for shortness of breath.   Cardiovascular:  Negative for chest pain, palpitations and leg swelling.  Gastrointestinal: Negative for nausea, abdominal pain and diarrhea.  Genitourinary: Negative for dysuria.  Musculoskeletal: Negative for falls.  Skin: Negative for rash.  Neurological: Negative for loss of consciousness and headaches.  Endo/Heme/Allergies: Negative for polydipsia.  Psychiatric/Behavioral: Negative for depression and suicidal ideas. The patient is not nervous/anxious and does not have insomnia.     Objective  BP 148/78 mmHg  Pulse 64  Temp(Src) 97.9 F (36.6 C) (Oral)  Ht 5\' 7"  (1.702 m)  Wt 168 lb 4 oz (76.318 kg)  BMI 26.35 kg/m2  SpO2 96%  Physical Exam  Physical Exam  Constitutional: She is oriented to person, place, and time and well-developed, well-nourished, and in no distress. No distress.  HENT:  Head: Normocephalic and atraumatic.  Eyes: Conjunctivae are normal.  Neck: Neck supple. No thyromegaly present.  Cardiovascular: Normal rate, regular rhythm and normal heart sounds.   No murmur heard. Pulmonary/Chest: Effort normal and breath sounds normal. She has no wheezes.  Abdominal: She exhibits no distension and no mass.  Musculoskeletal: She exhibits no edema.  Lymphadenopathy:    She has no cervical adenopathy.  Neurological: She is alert and oriented to person, place, and time.  Skin: Skin is warm and dry. No rash noted. She is not diaphoretic.  Psychiatric: Memory, affect and judgment normal.    Lab Results  Component Value Date   TSH 0.58 02/15/2014   Lab Results  Component Value Date   WBC 8.0 02/15/2014   HGB 12.2 02/15/2014   HCT 37.2 02/15/2014   MCV 85.2 02/15/2014   PLT 297.0 02/15/2014   Lab Results  Component Value Date   CREATININE 1.96* 02/15/2014   BUN 51* 02/15/2014   NA 137 02/15/2014   K 5.0 02/15/2014   CL 103 02/15/2014   CO2 28 02/15/2014   Lab Results  Component Value Date   ALT 22 02/15/2014   AST 38* 02/15/2014   ALKPHOS 55 02/15/2014   BILITOT  0.4 02/15/2014   Lab Results  Component Value Date   CHOL 225* 02/15/2014   Lab Results  Component Value Date   HDL 98.50 02/15/2014   Lab Results  Component Value Date   LDLCALC 105* 02/15/2014   Lab Results  Component Value Date   TRIG 107.0 02/15/2014   Lab Results  Component Value Date   CHOLHDL 2 02/15/2014     Assessment & Plan  HTN (hypertension) Poor controlled, will increase Lisinopril to bid. Encouraged heart healthy diet such as the DASH diet and exercise as tolerated.    Renal insufficiency mild   Proteinuria Will monitor   Overweight Encouraged DASH diet, decrease po intake and increase exercise as tolerated. Needs 7-8 hours of sleep nightly. Avoid trans fats, eat small, frequent meals every 4-5 hours with lean proteins, complex carbs and healthy fats. Minimize simple carbs   Hypercalcemia Resolved with recheck    Anemia resolved

## 2014-04-04 NOTE — Assessment & Plan Note (Signed)
Poor controlled, will increase Lisinopril to bid. Encouraged heart healthy diet such as the DASH diet and exercise as tolerated.

## 2014-04-04 NOTE — Assessment & Plan Note (Signed)
Resolved with recheck

## 2014-04-04 NOTE — Assessment & Plan Note (Signed)
mild

## 2014-04-04 NOTE — Assessment & Plan Note (Signed)
Encouraged DASH diet, decrease po intake and increase exercise as tolerated. Needs 7-8 hours of sleep nightly. Avoid trans fats, eat small, frequent meals every 4-5 hours with lean proteins, complex carbs and healthy fats. Minimize simple carbs 

## 2014-05-07 ENCOUNTER — Ambulatory Visit: Payer: Medicare Other | Admitting: Cardiology

## 2014-05-31 ENCOUNTER — Encounter: Payer: Self-pay | Admitting: Family Medicine

## 2014-05-31 ENCOUNTER — Ambulatory Visit (INDEPENDENT_AMBULATORY_CARE_PROVIDER_SITE_OTHER): Payer: Medicare Other | Admitting: Family Medicine

## 2014-05-31 VITALS — BP 122/72 | HR 63 | Temp 98.4°F | Ht 67.0 in | Wt 167.2 lb

## 2014-05-31 DIAGNOSIS — I10 Essential (primary) hypertension: Secondary | ICD-10-CM | POA: Diagnosis not present

## 2014-05-31 DIAGNOSIS — N289 Disorder of kidney and ureter, unspecified: Secondary | ICD-10-CM | POA: Diagnosis not present

## 2014-05-31 DIAGNOSIS — Z780 Asymptomatic menopausal state: Secondary | ICD-10-CM

## 2014-05-31 DIAGNOSIS — F32A Depression, unspecified: Secondary | ICD-10-CM

## 2014-05-31 DIAGNOSIS — D649 Anemia, unspecified: Secondary | ICD-10-CM | POA: Diagnosis not present

## 2014-05-31 DIAGNOSIS — Z7189 Other specified counseling: Secondary | ICD-10-CM

## 2014-05-31 DIAGNOSIS — F329 Major depressive disorder, single episode, unspecified: Secondary | ICD-10-CM

## 2014-05-31 DIAGNOSIS — E663 Overweight: Secondary | ICD-10-CM

## 2014-05-31 LAB — COMPREHENSIVE METABOLIC PANEL
ALT: 23 U/L (ref 0–35)
AST: 34 U/L (ref 0–37)
Albumin: 3.9 g/dL (ref 3.5–5.2)
Alkaline Phosphatase: 47 U/L (ref 39–117)
BUN: 51 mg/dL — ABNORMAL HIGH (ref 6–23)
CO2: 28 mEq/L (ref 19–32)
Calcium: 11.1 mg/dL — ABNORMAL HIGH (ref 8.4–10.5)
Chloride: 104 mEq/L (ref 96–112)
Creatinine, Ser: 1.69 mg/dL — ABNORMAL HIGH (ref 0.40–1.20)
GFR: 31.93 mL/min — ABNORMAL LOW (ref >60.00)
Glucose, Bld: 87 mg/dL (ref 70–99)
Potassium: 4.6 mEq/L (ref 3.5–5.1)
Sodium: 137 mEq/L (ref 135–145)
Total Bilirubin: 0.3 mg/dL (ref 0.2–1.2)
Total Protein: 6.5 g/dL (ref 6.0–8.3)

## 2014-05-31 MED ORDER — ATENOLOL 25 MG PO TABS: ORAL_TABLET | ORAL | Status: DC

## 2014-05-31 MED ORDER — LISINOPRIL 20 MG PO TABS: 20.0000 mg | ORAL_TABLET | Freq: Two times a day (BID) | ORAL | Status: DC

## 2014-05-31 MED ORDER — AMLODIPINE BESYLATE 10 MG PO TABS
10.0000 mg | ORAL_TABLET | Freq: Every day | ORAL | Status: DC
Start: 2014-05-31 — End: 2014-09-28

## 2014-05-31 NOTE — Progress Notes (Signed)
Pre visit review using our clinic review tool, if applicable. No additional management support is needed unless otherwise documented below in the visit note. 

## 2014-05-31 NOTE — Patient Instructions (Signed)
Salon pas patches or gel and 2 Tylenol ES 500 mg at bed as needed   Back Pain, Adult Low back pain is very common. About 1 in 5 people have back pain.The cause of low back pain is rarely dangerous. The pain often gets better over time.About half of people with a sudden onset of back pain feel better in just 2 weeks. About 8 in 10 people feel better by 6 weeks.  CAUSES Some common causes of back pain include:  Strain of the muscles or ligaments supporting the spine.  Wear and tear (degeneration) of the spinal discs.  Arthritis.  Direct injury to the back. DIAGNOSIS Most of the time, the direct cause of low back pain is not known.However, back pain can be treated effectively even when the exact cause of the pain is unknown.Answering your caregiver's questions about your overall health and symptoms is one of the most accurate ways to make sure the cause of your pain is not dangerous. If your caregiver needs more information, he or she may order lab work or imaging tests (X-rays or MRIs).However, even if imaging tests show changes in your back, this usually does not require surgery. HOME CARE INSTRUCTIONS For many people, back pain returns.Since low back pain is rarely dangerous, it is often a condition that people can learn to [facility] their own.   Remain active. It is stressful on the back to sit or stand in one place. Do not sit, drive, or stand in one place for more than 30 minutes at a time. Take short walks on level surfaces as soon as pain allows.Try to increase the length of time you walk each day.  Do not stay in bed.Resting more than 1 or 2 days can delay your recovery.  Do not avoid exercise or work.Your body is made to move.It is not dangerous to be active, even though your back may hurt.Your back will likely heal faster if you return to being active before your pain is gone.  Pay attention to your body when you bend and lift. Many people have less discomfortwhen  lifting if they bend their knees, keep the load close to their bodies,and avoid twisting. Often, the most comfortable positions are those that put less stress on your recovering back.  Find a comfortable position to sleep. Use a firm mattress and lie on your side with your knees slightly bent. If you lie on your back, put a pillow under your knees.  Only take over-the-counter or prescription medicines as directed by your caregiver. Over-the-counter medicines to reduce pain and inflammation are often the most helpful.Your caregiver may prescribe muscle relaxant drugs.These medicines help dull your pain so you can more quickly return to your normal activities and healthy exercise.  Put ice on the injured area.  Put ice in a plastic bag.  Place a towel between your skin and the bag.  Leave the ice on for 15-20 minutes, 03-04 times a day for the first 2 to 3 days. After that, ice and heat may be alternated to reduce pain and spasms.  Ask your caregiver about trying back exercises and gentle massage. This may be of some benefit.  Avoid feeling anxious or stressed.Stress increases muscle tension and can worsen back pain.It is important to recognize when you are anxious or stressed and learn ways to manage it.Exercise is a great option. SEEK MEDICAL CARE IF:  You have pain that is not relieved with rest or medicine.  You have pain that does not  improve in 1 week.  You have new symptoms.  You are generally not feeling well. SEEK IMMEDIATE MEDICAL CARE IF:   You have pain that radiates from your back into your legs.  You develop new bowel or bladder control problems.  You have unusual weakness or numbness in your arms or legs.  You develop nausea or vomiting.  You develop abdominal pain.  You feel faint. Document Released: 01/19/2005 Document Revised: 07/21/2011 Document Reviewed: 05/23/2013 [facility] Patient Information 2015 [place], [place]. This information is not intended to  replace advice given to you by your health care provider. Make sure you discuss any questions you have with your health care provider.

## 2014-06-10 ENCOUNTER — Encounter: Payer: Self-pay | Admitting: Family Medicine

## 2014-06-10 DIAGNOSIS — Z7189 Other specified counseling: Secondary | ICD-10-CM

## 2014-06-10 HISTORY — DX: Other specified counseling: Z71.89

## 2014-06-10 NOTE — Assessment & Plan Note (Signed)
Well controlled, no changes to meds. Encouraged heart healthy diet such as the DASH diet and exercise as tolerated. Am willing to prescribe Hydralazine since patient with good response

## 2014-06-10 NOTE — Assessment & Plan Note (Signed)
Improved since last check will continue to monitor and patient must maintain adequate hydration

## 2014-06-10 NOTE — Progress Notes (Signed)
Jillian Hayes  [id number] 1945-12-17 06/10/2014      Progress Note-Follow Up  Subjective  Chief Complaint  Chief Complaint  Patient presents with  . Follow-up    HPI  Patient is a 69 y.o. female in today for routine medical care. Patient is in today for follow-up. Overall is feeling much better. Has stopped. Reports her blood pressures been running in the 130s mostly, but only range from 120s to 140s. She denies any headache or recent illness. Denies CP/palp/SOB/HA/congestion/fevers/GI or GU c/o. Taking meds as prescribed  Past Medical History  Diagnosis Date  . Allergy     seasonal  . Anemia     iron deficiency  . Depression 1991    hospitalized  . Anxiety   . Hypertension   . Insomnia 06/24/2010  . Overweight(278.02) 06/24/2010  . Fatigue 06/24/2010  . Chronic headaches 06/24/2010  . Multiple chemical sensitivity syndrome 06/25/2010  . History of chicken pox 06/25/2010  . Bladder polyps 06/25/2010  . Renal insufficiency 03/26/2011  . Benign fundic gland polyps of stomach   . Proteinuria 02/18/2014  . Hypercalcemia 02/18/2014  . Medicare annual wellness visit, subsequent 11/09/2011    Past Surgical History  Procedure Laterality Date  . Polyps on bladder removed  1972    benign  . Cyst on left breast removed Left 1991    benign  . Nasal septum surgery  1986    rhinoplasty  . Tonsillectomy  1962    Family History  Problem Relation Age of Onset  . Breast cancer Mother 2    left breast removed, 2010 lung  . Nephrolithiasis Father   . Heart attack Father 58    X 3  . Heart disease Father     smoker  . Hypertension Brother   . Melanoma Son 37    melanoma on leg removed  . Pancreatic cancer Maternal Grandmother   . Hypertension Paternal Grandmother   . Obesity Paternal Grandmother   . Diabetes Mother   . Diabetes Maternal Aunt     x 2  . Diabetes Maternal Uncle     x 3  . Heart attack Paternal Grandfather 78    History   Social History  . Marital  Status: Married    Spouse Name: N/A  . Number of Children: 2  . Years of Education: N/A   Occupational History  . retired    Social History Main Topics  . Smoking status: Never Smoker   . Smokeless tobacco: Never Used  . Alcohol Use: No  . Drug Use: No  . Sexual Activity:    Partners: Male     Comment: lives with husband, wears with husband   Other Topics Concern  . Not on file   Social History Narrative   Lives with husband.      Current Outpatient Prescriptions on File Prior to Visit  Medication Sig Dispense Refill  . ALFALFA PO Take 4,500 mg by mouth daily.      Marland Kitchen b complex vitamins capsule Take 1 capsule by mouth daily.    . Calcium Carbonate-Vitamin D (CALCIUM + D PO) Take 1,000 mg by mouth daily.      . cholecalciferol (VITAMIN D) 1000 UNITS tablet Take 1,000 Units by mouth daily.      . Ferrous Fumarate (IRON) 18 MG TBCR Take 1 tablet by mouth daily.      . Flaxseed, Linseed, (FLAX SEED OIL PO) Take 1 capsule by mouth 3 (three) times daily.    Marland Kitchen  hydrALAZINE (APRESOLINE) 10 MG tablet Take 1 tablet (10 mg total) by mouth 3 (three) times daily. 270 tablet 3  . L-GLUTAMINE PO Take 1 capsule by mouth daily.    . Probiotic Product (PROBIOTIC DAILY PO) Take 1 tablet by mouth daily.    . vitamin C (ASCORBIC ACID) 500 MG tablet Take 500 mg by mouth daily as needed.      No current facility-administered medications on file prior to visit.    Allergies  Allergen Reactions  . Sulfa Antibiotics Nausea And Vomiting    Review of Systems  Review of Systems  Constitutional: Negative for fever and malaise/fatigue.  HENT: Negative for congestion.   Eyes: Negative for discharge.  Respiratory: Negative for shortness of breath.   Cardiovascular: Negative for chest pain, palpitations and leg swelling.  Gastrointestinal: Negative for nausea, abdominal pain and diarrhea.  Genitourinary: Negative for dysuria.  Musculoskeletal: Negative for falls.  Skin: Negative for rash.    Neurological: Negative for loss of consciousness and headaches.  Endo/Heme/Allergies: Negative for polydipsia.  Psychiatric/Behavioral: Negative for depression and suicidal ideas. The patient is not nervous/anxious and does not have insomnia.     Objective  BP 122/72 mmHg  Pulse 63  Temp(Src) 98.4 F (36.9 C) (Oral)  Ht 5\' 7"  (1.702 m)  Wt 167 lb 4 oz (75.864 kg)  BMI 26.19 kg/m2  SpO2 95%  Physical Exam  Physical Exam  Constitutional: She is oriented to person, place, and time and well-developed, well-nourished, and in no distress. No distress.  HENT:  Head: Normocephalic and atraumatic.  Eyes: Conjunctivae are normal.  Neck: Neck supple. No thyromegaly present.  Cardiovascular: Normal rate, regular rhythm and normal heart sounds.   No murmur heard. Pulmonary/Chest: Effort normal and breath sounds normal. She has no wheezes.  Abdominal: She exhibits no distension and no mass.  Musculoskeletal: She exhibits no edema.  Lymphadenopathy:    She has no cervical adenopathy.  Neurological: She is alert and oriented to person, place, and time.  Skin: Skin is warm and dry. No rash noted. She is not diaphoretic.  Psychiatric: Memory, affect and judgment normal.    Lab Results  Component Value Date   TSH 0.58 02/15/2014   Lab Results  Component Value Date   WBC 8.0 02/15/2014   HGB 12.2 02/15/2014   HCT 37.2 02/15/2014   MCV 85.2 02/15/2014   PLT 297.0 02/15/2014   Lab Results  Component Value Date   CREATININE 1.69* 05/31/2014   BUN 51* 05/31/2014   NA 137 05/31/2014   K 4.6 05/31/2014   CL 104 05/31/2014   CO2 28 05/31/2014   Lab Results  Component Value Date   ALT 23 05/31/2014   AST 34 05/31/2014   ALKPHOS 47 05/31/2014   BILITOT 0.3 05/31/2014   Lab Results  Component Value Date   CHOL 225* 02/15/2014   Lab Results  Component Value Date   HDL 98.50 02/15/2014   Lab Results  Component Value Date   LDLCALC 105* 02/15/2014   Lab Results   Component Value Date   TRIG 107.0 02/15/2014   Lab Results  Component Value Date   CHOLHDL 2 02/15/2014     Assessment & Plan  HTN (hypertension) Well controlled, no changes to meds. Encouraged heart healthy diet such as the DASH diet and exercise as tolerated. Am willing to prescribe Hydralazine since patient with good response   Overweight Encouraged DASH diet, decrease po intake and increase exercise as tolerated. Needs 7-8 hours of  sleep nightly. Avoid trans fats, eat small, frequent meals every 4-5 hours with lean proteins, complex carbs and healthy fats. Minimize simple carbs, GMO foods.   Anemia Increase leafy greens, consider increased lean red meat and using cast iron cookware. Continue to monitor, report any concerns   Renal insufficiency Improved since last check will continue to monitor and patient must maintain adequate hydration   Depression Doing well without medications at this time   Advanced care planning/counseling discussion Patient presents copy of HCP and Living Will which she has had notarized, it will be scanned into her medical record

## 2014-06-10 NOTE — Assessment & Plan Note (Signed)
Increase leafy greens, consider increased lean red meat and using cast iron cookware. Continue to monitor, report any concerns 

## 2014-06-10 NOTE — Assessment & Plan Note (Signed)
Patient presents copy of HCP and Living Will which she has had notarized, it will be scanned into her medical record

## 2014-06-10 NOTE — Assessment & Plan Note (Signed)
Doing well without medications at this time

## 2014-06-10 NOTE — Assessment & Plan Note (Signed)
Encouraged DASH diet, decrease po intake and increase exercise as tolerated. Needs 7-8 hours of sleep nightly. Avoid trans fats, eat small, frequent meals every 4-5 hours with lean proteins, complex carbs and healthy fats. Minimize simple carbs, GMO foods. 

## 2014-06-15 ENCOUNTER — Ambulatory Visit: Payer: Medicare Other | Admitting: Cardiology

## 2014-07-30 ENCOUNTER — Other Ambulatory Visit: Payer: Self-pay

## 2014-08-02 LAB — HM MAMMOGRAPHY

## 2014-08-09 ENCOUNTER — Encounter: Payer: Self-pay | Admitting: Family Medicine

## 2014-08-13 ENCOUNTER — Encounter: Payer: Self-pay | Admitting: Family Medicine

## 2014-08-16 ENCOUNTER — Ambulatory Visit (INDEPENDENT_AMBULATORY_CARE_PROVIDER_SITE_OTHER): Payer: Medicare Other | Admitting: Medical

## 2014-08-16 ENCOUNTER — Encounter: Payer: Self-pay | Admitting: Medical

## 2014-08-16 VITALS — BP 160/70 | HR 56 | Temp 97.9°F | Ht 67.0 in | Wt 165.4 lb

## 2014-08-16 DIAGNOSIS — T7840XA Allergy, unspecified, initial encounter: Secondary | ICD-10-CM | POA: Diagnosis not present

## 2014-08-16 NOTE — Progress Notes (Signed)
Subjective:    Patient ID: Jillian Hayes, female    DOB: 27-Jul-1945, 69 y.o.   MRN: [id number]  HPI  Pt states  had small bite possibly insect of some sort on her rt hand dorsal aspect rt hand  at base of rt 5th digit. Noticed one week(no insect seen and no bite mark). The  area itched and mild raised like a bite. Then over past week mild rash on both wrists. Also rash mostly on  rt side of face and under her chin. Some on left side of face.  Pt states she did cut grass on Tuesday last week.  Pt has rash on her face(rt and left side of face). But never had vesicle appearance. Overall rash is better than first day.   On review of causes no definitive cause. Has been taking care of daughters.    Review of Systems  Constitutional: Negative for fever, chills and fatigue.  Respiratory: Negative for cough, chest tightness, shortness of breath and wheezing.   Cardiovascular: Negative for chest pain and palpitations.  Musculoskeletal: Negative for back pain and arthralgias.  Skin: Positive for rash.  Neurological: Negative for dizziness, speech difficulty, weakness, light-headedness, numbness and headaches.  Hematological: Negative for adenopathy. Does not bruise/bleed easily.    Past Medical History  Diagnosis Date  . Allergy     seasonal  . Anemia     iron deficiency  . Depression 1991    hospitalized  . Anxiety   . Hypertension   . Insomnia 06/24/2010  . Overweight(278.02) 06/24/2010  . Fatigue 06/24/2010  . Chronic headaches 06/24/2010  . Multiple chemical sensitivity syndrome 06/25/2010  . History of chicken pox 06/25/2010  . Bladder polyps 06/25/2010  . Renal insufficiency 03/26/2011  . Benign fundic gland polyps of stomach   . Proteinuria 02/18/2014  . Hypercalcemia 02/18/2014  . Medicare annual wellness visit, subsequent 11/09/2011  . Advanced care planning/counseling discussion 06/10/2014    05/31/2014 patient presents copy of HCP and Living Will    History   Social  History  . Marital Status: Married    Spouse Name: N/A  . Number of Children: 2  . Years of Education: N/A   Occupational History  . retired    Social History Main Topics  . Smoking status: Never Smoker   . Smokeless tobacco: Never Used  . Alcohol Use: No  . Drug Use: No  . Sexual Activity:    Partners: Male     Comment: lives with husband, wears with husband   Other Topics Concern  . Not on file   Social History Narrative   Lives with husband.      Past Surgical History  Procedure Laterality Date  . Polyps on bladder removed  1972    benign  . Cyst on left breast removed Left 1991    benign  . Nasal septum surgery  1986    rhinoplasty  . Tonsillectomy  1962    Family History  Problem Relation Age of Onset  . Breast cancer Mother 57    left breast removed, 2010 lung  . Nephrolithiasis Father   . Heart attack Father 69    X 3  . Heart disease Father     smoker  . Hypertension Brother   . Melanoma Son 37    melanoma on leg removed  . Pancreatic cancer Maternal Grandmother   . Hypertension Paternal Grandmother   . Obesity Paternal Grandmother   . Diabetes Mother   .  Diabetes Maternal Aunt     x 2  . Diabetes Maternal Uncle     x 3  . Heart attack Paternal Grandfather 40    Allergies  Allergen Reactions  . Sulfa Antibiotics Nausea And Vomiting    Current Outpatient Prescriptions on File Prior to Visit  Medication Sig Dispense Refill  . ALFALFA PO Take 4,500 mg by mouth daily.      Marland Kitchen amLODipine (NORVASC) 10 MG tablet Take 1 tablet (10 mg total) by mouth daily. 90 tablet 2  . atenolol (TENORMIN) 25 MG tablet TAKE 1 BY MOUTH DAILY 90 tablet 2  . b complex vitamins capsule Take 1 capsule by mouth daily.    . Calcium Carbonate-Vitamin D (CALCIUM + D PO) Take 1,000 mg by mouth daily.      . cholecalciferol (VITAMIN D) 1000 UNITS tablet Take 1,000 Units by mouth daily.      . Ferrous Fumarate (IRON) 18 MG TBCR Take 1 tablet by mouth daily.      .  Flaxseed, Linseed, (FLAX SEED OIL PO) Take 1 capsule by mouth 3 (three) times daily.    . hydrALAZINE (APRESOLINE) 10 MG tablet Take 1 tablet (10 mg total) by mouth 3 (three) times daily. 270 tablet 3  . L-GLUTAMINE PO Take 1 capsule by mouth daily.    Marland Kitchen lisinopril (PRINIVIL,ZESTRIL) 20 MG tablet Take 1 tablet (20 mg total) by mouth 2 (two) times daily. 180 tablet 3  . Probiotic Product (PROBIOTIC DAILY PO) Take 1 tablet by mouth daily.    . vitamin C (ASCORBIC ACID) 500 MG tablet Take 500 mg by mouth daily as needed.      No current facility-administered medications on file prior to visit.    BP 160/70 mmHg  Pulse 56  Temp(Src) 97.9 F (36.6 C) (Oral)  Ht 5\' 7"  (1.702 m)  Wt 165 lb 6.4 oz (75.025 kg)  BMI 25.90 kg/m2  SpO2 100%       Objective:   Physical Exam  General- No acute distress. Pleasant patient. Neck- Full range of motion, no jvd Lungs- Clear, even and unlabored. Heart- regular rate and rhythm. Neurologic- CNII- XII grossly intact. Derm- mild papular  rash on face rt and lt side. Worse on rt side. No vesciles. Wrist- faint ventral papular rash.        Assessment & Plan:  Slowly resolving rash  and much better. Use your hydrocortisone twice a day for only on more week. Triamcinolone twice daily. If rash worsens then will need to give prednisone.(note pt declined treatment with prednisone since she is satisfied with imrprovement thus far)  htn- check bp daily and give [place] update on Monday with reading. May need to add 5th med. If any neurologic or cardiac symptoms then ED  Follow as needed

## 2014-08-16 NOTE — Progress Notes (Signed)
Pre visit review using our clinic review tool, if applicable. No additional management support is needed unless otherwise documented below in the visit note. 

## 2014-08-16 NOTE — Patient Instructions (Addendum)
Slowly resolving rash  and much better. Use your hydrocortisone twice a day for only on more week. Triamcinolone twice daily. If rash worsens then will need to give prednisone.(note pt declined treatment with prednisone since she is satisfied with imrprovement thus far)  htn- check bp daily and give [place] update on Monday with reading. May need to add 5th med. If any neurologic or cardiac symptoms then ED  Follow as needed

## 2014-08-20 ENCOUNTER — Telehealth: Payer: Self-pay | Admitting: Family Medicine

## 2014-08-20 ENCOUNTER — Encounter: Payer: Self-pay | Admitting: Family Medicine

## 2014-08-20 NOTE — Telephone Encounter (Signed)
Caller name: Nadea Relation to pt: Call back number: [phone] Pharmacy:  Reason for call:  Patient states that she was supposed to call in with BP readings:  7-15  149/57 7-16  147/65 7-17  155/77 7-18  131/67

## 2014-08-23 NOTE — Telephone Encounter (Signed)
Left message for patient to return my call.

## 2014-08-23 NOTE — Telephone Encounter (Signed)
Patient states she feels her BP is getting better and that she had been under a lot of stress which made it higher. Says she has appointment with Dr. Charlett Blake in early August and will discuss with her at that time.

## 2014-09-21 ENCOUNTER — Ambulatory Visit: Payer: Medicare Other | Admitting: Family Medicine

## 2014-09-25 ENCOUNTER — Ambulatory Visit: Payer: Medicare Other | Admitting: Family Medicine

## 2014-09-28 ENCOUNTER — Encounter: Payer: Self-pay | Admitting: Family Medicine

## 2014-09-28 ENCOUNTER — Ambulatory Visit (INDEPENDENT_AMBULATORY_CARE_PROVIDER_SITE_OTHER): Payer: Medicare Other | Admitting: Family Medicine

## 2014-09-28 VITALS — BP 134/64 | HR 59 | Temp 97.7°F | Ht 66.5 in | Wt 162.2 lb

## 2014-09-28 DIAGNOSIS — R946 Abnormal results of thyroid function studies: Secondary | ICD-10-CM | POA: Diagnosis not present

## 2014-09-28 DIAGNOSIS — G4700 Insomnia, unspecified: Secondary | ICD-10-CM

## 2014-09-28 DIAGNOSIS — I10 Essential (primary) hypertension: Secondary | ICD-10-CM | POA: Diagnosis not present

## 2014-09-28 DIAGNOSIS — Z0000 Encounter for general adult medical examination without abnormal findings: Secondary | ICD-10-CM

## 2014-09-28 DIAGNOSIS — E663 Overweight: Secondary | ICD-10-CM

## 2014-09-28 DIAGNOSIS — D649 Anemia, unspecified: Secondary | ICD-10-CM

## 2014-09-28 DIAGNOSIS — N289 Disorder of kidney and ureter, unspecified: Secondary | ICD-10-CM

## 2014-09-28 LAB — COMPREHENSIVE METABOLIC PANEL
ALT: 24 U/L (ref 6–29)
AST: 36 U/L — AB (ref 10–35)
Albumin: 4 g/dL (ref 3.6–5.1)
Alkaline Phosphatase: 42 U/L (ref 33–130)
BILIRUBIN TOTAL: 0.4 mg/dL (ref 0.2–1.2)
BUN: 40 mg/dL — AB (ref 7–25)
CHLORIDE: 103 mmol/L (ref 98–110)
CO2: 26 mmol/L (ref 20–31)
CREATININE: 2.54 mg/dL — AB (ref 0.50–0.99)
Calcium: 10.3 mg/dL (ref 8.6–10.4)
Glucose, Bld: 120 mg/dL — ABNORMAL HIGH (ref 65–99)
Potassium: 4.5 mmol/L (ref 3.5–5.3)
Sodium: 140 mmol/L (ref 135–146)
Total Protein: 6.2 g/dL (ref 6.1–8.1)

## 2014-09-28 LAB — LIPID PANEL
Cholesterol: 185 mg/dL (ref 125–200)
HDL: 91 mg/dL (ref >=46)
LDL CALC: 61 mg/dL (ref <130)
Total CHOL/HDL Ratio: 2 Ratio (ref <=5.0)
Triglycerides: 164 mg/dL — ABNORMAL HIGH (ref <150)
VLDL: 33 mg/dL — ABNORMAL HIGH (ref <30)

## 2014-09-28 LAB — CBC
HEMATOCRIT: 33.2 % — AB (ref 36.0–46.0)
Hemoglobin: 11 g/dL — ABNORMAL LOW (ref 12.0–15.0)
MCH: 28.4 pg (ref 26.0–34.0)
MCHC: 33.1 g/dL (ref 30.0–36.0)
MCV: 85.6 fL (ref 78.0–100.0)
MPV: 9.9 fL (ref 8.6–12.4)
Platelets: 239 10*3/uL (ref 150–400)
RBC: 3.88 MIL/uL (ref 3.87–5.11)
RDW: 15.2 % (ref 11.5–15.5)
WBC: 5.1 10*3/uL (ref 4.0–10.5)

## 2014-09-28 LAB — TSH: TSH: 0.258 u[IU]/mL — AB (ref 0.350–4.500)

## 2014-09-28 MED ORDER — AMLODIPINE BESYLATE 10 MG PO TABS: 10.0000 mg | ORAL_TABLET | Freq: Every day | ORAL | Status: DC

## 2014-09-28 MED ORDER — AMLODIPINE BESYLATE 5 MG PO TABS: 5.0000 mg | ORAL_TABLET | Freq: Every day | ORAL | Status: DC

## 2014-09-28 NOTE — Assessment & Plan Note (Addendum)
Recheck CMP today, creatinine up, increase hydration decrease lisinopril dose and recheck

## 2014-09-28 NOTE — Assessment & Plan Note (Signed)
Improved on recheck, Well controlled, no changes to meds. Encouraged heart healthy diet such as the DASH diet and exercise as tolerated.

## 2014-09-28 NOTE — Progress Notes (Signed)
Pre visit review using our clinic review tool, if applicable. No additional management support is needed unless otherwise documented below in the visit note. 

## 2014-09-28 NOTE — Patient Instructions (Signed)

## 2014-09-28 NOTE — Progress Notes (Signed)
Patient ID: Jillian Hayes, female   DOB: 10/25/1945, 69 y.o.   MRN: [id number]   Subjective:    Patient ID: Jillian Hayes, female    DOB: 02/08/1945, 69 y.o.   MRN: [id number]  Chief Complaint  Patient presents with  . Follow-up    HPI Patient is in today for follow up on numerous concerns. No recent illness. Has been stressed due to her recent visit with her sister in law she notes a HA due to visiting her this am. Otherwise reports feeling well. No other recent illness. Denies CP/palp/SOBcongestion/fevers/GI or GU c/o. Taking meds as prescribed  Past Medical History  Diagnosis Date  . Allergy     seasonal  . Anemia     iron deficiency  . Depression 1991    hospitalized  . Anxiety   . Hypertension   . Insomnia 06/24/2010  . Overweight(278.02) 06/24/2010  . Fatigue 06/24/2010  . Chronic headaches 06/24/2010  . Multiple chemical sensitivity syndrome 06/25/2010  . History of chicken pox 06/25/2010  . Bladder polyps 06/25/2010  . Renal insufficiency 03/26/2011  . Benign fundic gland polyps of stomach   . Proteinuria 02/18/2014  . Hypercalcemia 02/18/2014  . Medicare annual wellness visit, subsequent 11/09/2011  . Advanced care planning/counseling discussion 06/10/2014    05/31/2014 patient presents copy of HCP and Living Will    Past Surgical History  Procedure Laterality Date  . Polyps on bladder removed  1972    benign  . Cyst on left breast removed Left 1991    benign  . Nasal septum surgery  1986    rhinoplasty  . Tonsillectomy  1962    Family History  Problem Relation Age of Onset  . Breast cancer Mother 72    left breast removed, 2010 lung  . Nephrolithiasis Father   . Heart attack Father 60    X 3  . Heart disease Father     smoker  . Hypertension Brother   . Melanoma Son 37    melanoma on leg removed  . Pancreatic cancer Maternal Grandmother   . Hypertension Paternal Grandmother   . Obesity Paternal Grandmother   . Diabetes Mother   . Diabetes Maternal Aunt      x 2  . Diabetes Maternal Uncle     x 3  . Heart attack Paternal Grandfather 70    Social History   Social History  . Marital Status: Married    Spouse Name: N/A  . Number of Children: 2  . Years of Education: N/A   Occupational History  . retired    Social History Main Topics  . Smoking status: Never Smoker   . Smokeless tobacco: Never Used  . Alcohol Use: No  . Drug Use: No  . Sexual Activity:    Partners: Male     Comment: lives with husband, wears with husband   Other Topics Concern  . Not on file   Social History Narrative   Lives with husband.      Outpatient Prescriptions Prior to Visit  Medication Sig Dispense Refill  . ALFALFA PO Take 4,500 mg by mouth daily.      Marland Kitchen amLODipine (NORVASC) 10 MG tablet Take 1 tablet (10 mg total) by mouth daily. 90 tablet 2  . atenolol (TENORMIN) 25 MG tablet TAKE 1 BY MOUTH DAILY 90 tablet 2  . b complex vitamins capsule Take 1 capsule by mouth daily.    . Calcium Carbonate-Vitamin D (CALCIUM + D PO) Take  1,000 mg by mouth daily.      . cholecalciferol (VITAMIN D) 1000 UNITS tablet Take 1,000 Units by mouth daily.      . Ferrous Fumarate (IRON) 18 MG TBCR Take 1 tablet by mouth daily.      . Flaxseed, Linseed, (FLAX SEED OIL PO) Take 1 capsule by mouth 3 (three) times daily.    . hydrALAZINE (APRESOLINE) 10 MG tablet Take 1 tablet (10 mg total) by mouth 3 (three) times daily. 270 tablet 3  . L-GLUTAMINE PO Take 1 capsule by mouth daily.    Marland Kitchen lisinopril (PRINIVIL,ZESTRIL) 20 MG tablet Take 1 tablet (20 mg total) by mouth 2 (two) times daily. 180 tablet 3  . Probiotic Product (PROBIOTIC DAILY PO) Take 1 tablet by mouth daily.    . vitamin C (ASCORBIC ACID) 500 MG tablet Take 500 mg by mouth daily as needed.      No facility-administered medications prior to visit.    Allergies  Allergen Reactions  . Sulfa Antibiotics Nausea And Vomiting    Review of Systems  Constitutional: Negative for fever and malaise/fatigue.    HENT: Negative for congestion.   Eyes: Negative for discharge.  Respiratory: Negative for shortness of breath.   Cardiovascular: Negative for chest pain, palpitations and leg swelling.  Gastrointestinal: Negative for nausea and abdominal pain.  Genitourinary: Negative for dysuria.  Musculoskeletal: Negative for falls.  Skin: Negative for rash.  Neurological: Negative for loss of consciousness and headaches.  Endo/Heme/Allergies: Negative for environmental allergies.  Psychiatric/Behavioral: Negative for depression. The patient is not nervous/anxious.        Objective:    Physical Exam  Constitutional: She is oriented to person, place, and time. She appears well-developed and well-nourished. No distress.  HENT:  Head: Normocephalic and atraumatic.  Nose: Nose normal.  Eyes: Right eye exhibits no discharge. Left eye exhibits no discharge.  Neck: Normal range of motion. Neck supple.  Cardiovascular: Normal rate and regular rhythm.   No murmur heard. Pulmonary/Chest: Effort normal and breath sounds normal.  Abdominal: Soft. Bowel sounds are normal. There is no tenderness.  Musculoskeletal: She exhibits no edema.  Neurological: She is alert and oriented to person, place, and time.  Skin: Skin is warm and dry.  Psychiatric: She has a normal mood and affect.  Nursing note and vitals reviewed.   BP 162/68 mmHg  Pulse 59  Temp(Src) 97.7 F (36.5 C) (Oral)  Ht 5' 6.5" (1.689 m)  Wt 162 lb 4 oz (73.596 kg)  BMI 25.80 kg/m2  SpO2 96% Wt Readings from Last 3 Encounters:  09/28/14 162 lb 4 oz (73.596 kg)  08/16/14 165 lb 6.4 oz (75.025 kg)  05/31/14 167 lb 4 oz (75.864 kg)     Lab Results  Component Value Date   WBC 8.0 02/15/2014   HGB 12.2 02/15/2014   HCT 37.2 02/15/2014   PLT 297.0 02/15/2014   GLUCOSE 87 05/31/2014   CHOL 225* 02/15/2014   TRIG 107.0 02/15/2014   HDL 98.50 02/15/2014   LDLCALC 105* 02/15/2014   ALT 23 05/31/2014   AST 34 05/31/2014   NA 137  05/31/2014   K 4.6 05/31/2014   CL 104 05/31/2014   CREATININE 1.69* 05/31/2014   BUN 51* 05/31/2014   CO2 28 05/31/2014   TSH 0.58 02/15/2014    Lab Results  Component Value Date   TSH 0.58 02/15/2014   Lab Results  Component Value Date   WBC 8.0 02/15/2014   HGB 12.2 02/15/2014  HCT 37.2 02/15/2014   MCV 85.2 02/15/2014   PLT 297.0 02/15/2014   Lab Results  Component Value Date   NA 137 05/31/2014   K 4.6 05/31/2014   CO2 28 05/31/2014   GLUCOSE 87 05/31/2014   BUN 51* 05/31/2014   CREATININE 1.69* 05/31/2014   BILITOT 0.3 05/31/2014   ALKPHOS 47 05/31/2014   AST 34 05/31/2014   ALT 23 05/31/2014   PROT 6.5 05/31/2014   ALBUMIN 3.9 05/31/2014   CALCIUM 11.1* 05/31/2014   ANIONGAP 13 01/16/2014   GFR 31.93* 05/31/2014   Lab Results  Component Value Date   CHOL 225* 02/15/2014   Lab Results  Component Value Date   HDL 98.50 02/15/2014   Lab Results  Component Value Date   LDLCALC 105* 02/15/2014   Lab Results  Component Value Date   TRIG 107.0 02/15/2014   Lab Results  Component Value Date   CHOLHDL 2 02/15/2014   No results found for: HGBA1C     Assessment & Plan:   Renal insufficiency Recheck CMP today, creatinine up, increase hydration decrease lisinopril dose and recheck  HTN (hypertension) Improved on recheck, Well controlled, no changes to meds. Encouraged heart healthy diet such as the DASH diet and exercise as tolerated.   Insomnia Encouraged good sleep hygiene such as dark, quiet room. No blue/green glowing lights such as computer screens in bedroom. No alcohol or stimulants in evening. Cut down on caffeine as able. Regular exercise is helpful but not just prior to bed time.   Overweight Encouraged DASH diet, decrease po intake and increase exercise as tolerated. Needs 7-8 hours of sleep nightly. Avoid trans fats, eat small, frequent meals every 4-5 hours with lean proteins, complex carbs and healthy fats. Minimize simple carbs, GMO  foods.  Anemia Increase leafy greens, consider increased lean red meat and using cast iron cookware. Continue to monitor, report any concerns   I am having Ms. Dampier maintain her cholecalciferol, Calcium Carbonate-Vitamin D (CALCIUM + D PO), ALFALFA PO, vitamin C, Iron, (Flaxseed, Linseed, (FLAX SEED OIL PO)), b complex vitamins, L-GLUTAMINE PO, Probiotic Product (PROBIOTIC DAILY PO), hydrALAZINE, lisinopril, atenolol, and amLODipine.  No orders of the defined types were placed in this encounter.     Elizabeth Sauer, LPN

## 2014-09-28 NOTE — Assessment & Plan Note (Signed)
Encouraged good sleep hygiene such as dark, quiet room. No blue/green glowing lights such as computer screens in bedroom. No alcohol or stimulants in evening. Cut down on caffeine as able. Regular exercise is helpful but not just prior to bed time.  

## 2014-09-30 NOTE — Assessment & Plan Note (Signed)
Encouraged DASH diet, decrease po intake and increase exercise as tolerated. Needs 7-8 hours of sleep nightly. Avoid trans fats, eat small, frequent meals every 4-5 hours with lean proteins, complex carbs and healthy fats. Minimize simple carbs, GMO foods. 

## 2014-09-30 NOTE — Assessment & Plan Note (Signed)
Increase leafy greens, consider increased lean red meat and using cast iron cookware. Continue to monitor, report any concerns 

## 2014-10-01 ENCOUNTER — Other Ambulatory Visit: Payer: Self-pay | Admitting: Family Medicine

## 2014-10-01 DIAGNOSIS — R7989 Other specified abnormal findings of blood chemistry: Secondary | ICD-10-CM

## 2014-10-01 DIAGNOSIS — N289 Disorder of kidney and ureter, unspecified: Secondary | ICD-10-CM

## 2014-10-01 NOTE — Telephone Encounter (Signed)
Labs entered.

## 2014-10-02 ENCOUNTER — Other Ambulatory Visit: Payer: Self-pay | Admitting: Family Medicine

## 2014-10-03 ENCOUNTER — Telehealth: Payer: Self-pay | Admitting: *Deleted

## 2014-10-03 NOTE — Telephone Encounter (Signed)
Yes please, thanks

## 2014-10-03 NOTE — Telephone Encounter (Signed)
Patient has BP check scheduled 10/05/14 for "BP and Pulse check on lower BP medication."  I could not find a note about where this was ordered, so I wanted to make sure it was OK for the patient to have nurse visit to check on this.   Please advise.

## 2014-10-04 NOTE — Telephone Encounter (Signed)
Noted  

## 2014-10-05 ENCOUNTER — Other Ambulatory Visit: Payer: Medicare Other

## 2014-10-05 ENCOUNTER — Other Ambulatory Visit: Payer: Self-pay | Admitting: Family Medicine

## 2014-10-05 ENCOUNTER — Ambulatory Visit: Payer: Medicare Other

## 2014-10-05 ENCOUNTER — Other Ambulatory Visit (INDEPENDENT_AMBULATORY_CARE_PROVIDER_SITE_OTHER): Payer: Medicare Other

## 2014-10-05 VITALS — BP 170/72 | HR 54

## 2014-10-05 DIAGNOSIS — R109 Unspecified abdominal pain: Secondary | ICD-10-CM

## 2014-10-05 DIAGNOSIS — N289 Disorder of kidney and ureter, unspecified: Secondary | ICD-10-CM | POA: Diagnosis not present

## 2014-10-05 DIAGNOSIS — R7989 Other specified abnormal findings of blood chemistry: Secondary | ICD-10-CM

## 2014-10-05 DIAGNOSIS — I10 Essential (primary) hypertension: Secondary | ICD-10-CM

## 2014-10-05 LAB — T3, FREE: T3, Free: 3.6 pg/mL (ref 2.3–4.2)

## 2014-10-05 LAB — POCT URINALYSIS DIPSTICK
Bilirubin, UA: NEGATIVE
Blood, UA: NEGATIVE
GLUCOSE UA: NEGATIVE
KETONES UA: NEGATIVE
Leukocytes, UA: NEGATIVE
Nitrite, UA: NEGATIVE
SPEC GRAV UA: 1.015
Urobilinogen, UA: 0.2
pH, UA: 6

## 2014-10-05 LAB — COMPREHENSIVE METABOLIC PANEL
ALT: 19 U/L (ref 0–35)
AST: 32 U/L (ref 0–37)
Albumin: 4 g/dL (ref 3.5–5.2)
Alkaline Phosphatase: 46 U/L (ref 39–117)
BUN: 44 mg/dL — ABNORMAL HIGH (ref 6–23)
CALCIUM: 11.1 mg/dL — AB (ref 8.4–10.5)
CHLORIDE: 103 meq/L (ref 96–112)
CO2: 30 meq/L (ref 19–32)
Creatinine, Ser: 1.91 mg/dL — ABNORMAL HIGH (ref 0.40–1.20)
GFR: 27.7 mL/min — AB (ref >60.00)
Glucose, Bld: 78 mg/dL (ref 70–99)
Potassium: 4.5 mEq/L (ref 3.5–5.1)
Sodium: 141 mEq/L (ref 135–145)
Total Bilirubin: 0.6 mg/dL (ref 0.2–1.2)
Total Protein: 7 g/dL (ref 6.0–8.3)

## 2014-10-05 LAB — T4, FREE: FREE T4: 1.4 ng/dL (ref 0.60–1.60)

## 2014-10-05 MED ORDER — AMLODIPINE BESYLATE 5 MG PO TABS: 5.0000 mg | ORAL_TABLET | Freq: Two times a day (BID) | ORAL | Status: DC

## 2014-10-05 NOTE — Patient Instructions (Signed)
Increase Amlodipine to 5mg  2 times daily per Dr. Charlett Blake. Return to office for BP and Pulse check. If Feeling bad return next week for BP check. [place] for Urinalysis and and Urine Culture to be added to patients labs for today. Patient complaining of pain in lower abdomen per Dr. Charlett Blake.

## 2014-10-06 LAB — URINE CULTURE
Colony Count: NO GROWTH
Organism ID, Bacteria: NO GROWTH

## 2014-10-19 ENCOUNTER — Ambulatory Visit: Payer: Medicare Other

## 2014-10-19 VITALS — BP 146/68

## 2014-10-19 DIAGNOSIS — I10 Essential (primary) hypertension: Secondary | ICD-10-CM

## 2014-10-19 NOTE — Progress Notes (Signed)
Patient ID: Jillian Hayes, female   DOB: [birth year], 69 y.o.   MRN: [id number]  Patient in for BP check ordered by Dr. Charlett Blake. BP= 146/68. Last visit BP= 17/72 Per Dr. Larose Kells covering for Dr. Lorne Skeens to continue same dose of Amlodipine 5 mg 2 times daily,Check Bp 2 times per week and call ofice if BP > 140/85. Patient Agreed.

## 2015-03-10 ENCOUNTER — Other Ambulatory Visit: Payer: Self-pay | Admitting: Cardiology

## 2015-03-11 NOTE — Telephone Encounter (Signed)
Rx(s) sent to pharmacy electronically.  

## 2015-03-25 ENCOUNTER — Other Ambulatory Visit: Payer: Medicare Other

## 2015-03-31 ENCOUNTER — Other Ambulatory Visit: Payer: Self-pay | Admitting: Family Medicine

## 2015-04-02 ENCOUNTER — Ambulatory Visit: Payer: Medicare Other | Admitting: Family Medicine

## 2015-04-04 ENCOUNTER — Telehealth: Payer: Self-pay | Admitting: Family Medicine

## 2015-04-04 MED ORDER — ATENOLOL 25 MG PO TABS: 25.0000 mg | ORAL_TABLET | Freq: Every day | ORAL | Status: DC

## 2015-04-04 NOTE — Telephone Encounter (Signed)
Relationship to patient: Self  Can be reached: [phone]  Pharmacy: [place], [facility]   Reason for call: Pt is requesting a refill on her TENORMIN Rx. She says that she need a 90 day supply.

## 2015-04-04 NOTE — Telephone Encounter (Signed)
Sent in as requested.  Updated pharmacies in chart. Patient notified prescription filled.

## 2015-04-09 ENCOUNTER — Telehealth: Payer: Self-pay | Admitting: Family Medicine

## 2015-04-09 NOTE — Telephone Encounter (Signed)
Pharmacy: [id number] - [place], [place]  Reason for call: pt needing refill on hydralazine. She has 7 days on hand and takes 3/day. Please send in a 90 day supply.

## 2015-04-12 MED ORDER — HYDRALAZINE HCL 10 MG PO TABS: 10.0000 mg | ORAL_TABLET | Freq: Three times a day (TID) | ORAL | Status: DC

## 2015-05-07 ENCOUNTER — Other Ambulatory Visit: Payer: Self-pay | Admitting: Family Medicine

## 2015-05-07 MED ORDER — HYDRALAZINE HCL 10 MG PO TABS: 10.0000 mg | ORAL_TABLET | Freq: Three times a day (TID) | ORAL | Status: DC

## 2015-05-28 ENCOUNTER — Telehealth: Payer: Self-pay | Admitting: Family Medicine

## 2015-05-28 DIAGNOSIS — I10 Essential (primary) hypertension: Secondary | ICD-10-CM

## 2015-05-28 MED ORDER — LISINOPRIL 20 MG PO TABS: 20.0000 mg | ORAL_TABLET | Freq: Two times a day (BID) | ORAL | Status: DC

## 2015-05-28 MED ORDER — AMLODIPINE BESYLATE 5 MG PO TABS: 5.0000 mg | ORAL_TABLET | Freq: Two times a day (BID) | ORAL | Status: DC

## 2015-05-28 NOTE — Telephone Encounter (Signed)
Can be reached: [phone] Pharmacy: [facility] [place]  Reason for call: Pt needing refills on amlodipine and lisinopril. She is scheduled for OV 06/24/15. If she needs to come in sooner should it be with PA/NP?

## 2015-05-28 NOTE — Telephone Encounter (Signed)
Sent in prescriptions as instructed and called the patient informed refill done and will see her in May for her appointment.

## 2015-05-30 ENCOUNTER — Other Ambulatory Visit: Payer: Self-pay | Admitting: Family Medicine

## 2015-06-24 ENCOUNTER — Ambulatory Visit (INDEPENDENT_AMBULATORY_CARE_PROVIDER_SITE_OTHER): Payer: Medicare Other | Admitting: Family Medicine

## 2015-06-24 ENCOUNTER — Encounter: Payer: Self-pay | Admitting: Family Medicine

## 2015-06-24 VITALS — BP 160/78 | HR 56 | Temp 98.5°F | Ht 67.0 in | Wt 157.2 lb

## 2015-06-24 DIAGNOSIS — E663 Overweight: Secondary | ICD-10-CM

## 2015-06-24 DIAGNOSIS — N289 Disorder of kidney and ureter, unspecified: Secondary | ICD-10-CM

## 2015-06-24 DIAGNOSIS — F32A Depression, unspecified: Secondary | ICD-10-CM

## 2015-06-24 DIAGNOSIS — D649 Anemia, unspecified: Secondary | ICD-10-CM

## 2015-06-24 DIAGNOSIS — F329 Major depressive disorder, single episode, unspecified: Secondary | ICD-10-CM | POA: Diagnosis not present

## 2015-06-24 DIAGNOSIS — I10 Essential (primary) hypertension: Secondary | ICD-10-CM | POA: Diagnosis not present

## 2015-06-24 MED ORDER — LISINOPRIL 10 MG PO TABS: 10.0000 mg | ORAL_TABLET | Freq: Two times a day (BID) | ORAL | Status: DC

## 2015-06-24 MED ORDER — HYDRALAZINE HCL 10 MG PO TABS: ORAL_TABLET | ORAL | Status: DC

## 2015-06-24 MED ORDER — ATENOLOL 25 MG PO TABS: 25.0000 mg | ORAL_TABLET | Freq: Every day | ORAL | Status: DC

## 2015-06-24 NOTE — Progress Notes (Signed)
Pre visit review using our clinic review tool, if applicable. No additional management support is needed unless otherwise documented below in the visit note. 

## 2015-06-24 NOTE — Patient Instructions (Signed)
Hypertension Hypertension, commonly called high blood pressure, is when the force of blood pumping through your arteries is too strong. Your arteries are the blood vessels that carry blood from your heart throughout your body. A blood pressure reading consists of a higher number over a lower number, such as 110/72. The higher number (systolic) is the pressure inside your arteries when your heart pumps. The lower number (diastolic) is the pressure inside your arteries when your heart relaxes. Ideally you want your blood pressure below 120/80. Hypertension forces your heart to work harder to pump blood. Your arteries may become narrow or stiff. Having untreated or uncontrolled hypertension can cause heart attack, stroke, kidney disease, and other problems. RISK FACTORS Some risk factors for high blood pressure are controllable. Others are not.  Risk factors you cannot control include:   Race. You may be at higher risk if you are African American.  Age. Risk increases with age.  Gender. Men are at higher risk than women before age 45 years. After age 65, women are at higher risk than men. Risk factors you can control include:  Not getting enough exercise or physical activity.  Being overweight.  Getting too much fat, sugar, calories, or salt in your diet.  Drinking too much alcohol. SIGNS AND SYMPTOMS Hypertension does not usually cause signs or symptoms. Extremely high blood pressure (hypertensive crisis) may cause headache, anxiety, shortness of breath, and nosebleed. DIAGNOSIS To check if you have hypertension, your health care provider will measure your blood pressure while you are seated, with your arm held at the level of your heart. It should be measured at least twice using the same arm. Certain conditions can cause a difference in blood pressure between your right and left arms. A blood pressure reading that is higher than normal on one occasion does not mean that you need treatment. If  it is not clear whether you have high blood pressure, you may be asked to return on a different day to have your blood pressure checked again. Or, you may be asked to monitor your blood pressure at home for 1 or more weeks. TREATMENT Treating high blood pressure includes making lifestyle changes and possibly taking medicine. Living a healthy lifestyle can help lower high blood pressure. You may need to change some of your habits. Lifestyle changes may include:  Following the DASH diet. This diet is high in fruits, vegetables, and whole grains. It is low in salt, red meat, and added sugars.  Keep your sodium intake below 2,300 mg per day.  Getting at least 30-45 minutes of aerobic exercise at least 4 times per week.  Losing weight if necessary.  Not smoking.  Limiting alcoholic beverages.  Learning ways to reduce stress. Your health care provider may prescribe medicine if lifestyle changes are not enough to get your blood pressure under control, and if one of the following is true:  You are 18-59 years of age and your systolic blood pressure is above 140.  You are 60 years of age or older, and your systolic blood pressure is above 150.  Your diastolic blood pressure is above 90.  You have diabetes, and your systolic blood pressure is over 140 or your diastolic blood pressure is over 90.  You have kidney disease and your blood pressure is above 140/90.  You have heart disease and your blood pressure is above 140/90. Your personal target blood pressure may vary depending on your medical conditions, your age, and other factors. HOME CARE INSTRUCTIONS    Have your blood pressure rechecked as directed by your health care provider.   Take medicines only as directed by your health care provider. Follow the directions carefully. Blood pressure medicines must be taken as prescribed. The medicine does not work as well when you skip doses. Skipping doses also puts you at risk for  problems.  Do not smoke.   Monitor your blood pressure at home as directed by your health care provider. SEEK MEDICAL CARE IF:   You think you are having a reaction to medicines taken.  You have recurrent headaches or feel dizzy.  You have swelling in your ankles.  You have trouble with your vision. SEEK IMMEDIATE MEDICAL CARE IF:  You develop a severe headache or confusion.  You have unusual weakness, numbness, or feel faint.  You have severe chest or abdominal pain.  You vomit repeatedly.  You have trouble breathing. MAKE SURE YOU:   Understand these instructions.  Will watch your condition.  Will get help right away if you are not doing well or get worse.   This information is not intended to replace advice given to you by your health care provider. Make sure you discuss any questions you have with your health care provider.   Document Released: 01/19/2005 Document Revised: 06/05/2014 Document Reviewed: 11/11/2012 Elsevier Interactive Patient Education 2016 [employer]  

## 2015-06-25 LAB — COMPREHENSIVE METABOLIC PANEL
ALBUMIN: 4.1 g/dL (ref 3.5–5.2)
ALT: 20 U/L (ref 0–35)
AST: 29 U/L (ref 0–37)
Alkaline Phosphatase: 45 U/L (ref 39–117)
BILIRUBIN TOTAL: 0.4 mg/dL (ref 0.2–1.2)
BUN: 52 mg/dL — ABNORMAL HIGH (ref 6–23)
CALCIUM: 11.5 mg/dL — AB (ref 8.4–10.5)
CHLORIDE: 103 meq/L (ref 96–112)
CO2: 26 meq/L (ref 19–32)
CREATININE: 2.39 mg/dL — AB (ref 0.40–1.20)
GFR: 21.34 mL/min — AB (ref >60.00)
Glucose, Bld: 81 mg/dL (ref 70–99)
Potassium: 5.5 mEq/L — ABNORMAL HIGH (ref 3.5–5.1)
Sodium: 137 mEq/L (ref 135–145)
Total Protein: 7 g/dL (ref 6.0–8.3)

## 2015-06-25 LAB — LIPID PANEL
CHOL/HDL RATIO: 3
CHOLESTEROL: 199 mg/dL (ref 0–200)
HDL: 78.3 mg/dL (ref >39.00)
LDL CALC: 95 mg/dL (ref 0–99)
NonHDL: 120.26
TRIGLYCERIDES: 126 mg/dL (ref 0.0–149.0)
VLDL: 25.2 mg/dL (ref 0.0–40.0)

## 2015-06-25 LAB — CBC
HCT: 33.5 % — ABNORMAL LOW (ref 36.0–46.0)
Hemoglobin: 11.2 g/dL — ABNORMAL LOW (ref 12.0–15.0)
MCHC: 33.5 g/dL (ref 30.0–36.0)
MCV: 82.9 fl (ref 78.0–100.0)
PLATELETS: 273 10*3/uL (ref 150.0–400.0)
RBC: 4.04 Mil/uL (ref 3.87–5.11)
RDW: 15.5 % (ref 11.5–15.5)
WBC: 7.5 10*3/uL (ref 4.0–10.5)

## 2015-06-25 LAB — TSH: TSH: 0.13 u[IU]/mL — AB (ref 0.35–4.50)

## 2015-06-26 ENCOUNTER — Other Ambulatory Visit: Payer: Self-pay | Admitting: Family Medicine

## 2015-06-26 ENCOUNTER — Other Ambulatory Visit: Payer: Self-pay

## 2015-06-26 DIAGNOSIS — E875 Hyperkalemia: Secondary | ICD-10-CM

## 2015-06-26 DIAGNOSIS — N289 Disorder of kidney and ureter, unspecified: Secondary | ICD-10-CM

## 2015-06-28 ENCOUNTER — Other Ambulatory Visit (INDEPENDENT_AMBULATORY_CARE_PROVIDER_SITE_OTHER): Payer: Medicare Other

## 2015-06-28 ENCOUNTER — Ambulatory Visit (INDEPENDENT_AMBULATORY_CARE_PROVIDER_SITE_OTHER): Payer: Medicare Other | Admitting: Family Medicine

## 2015-06-28 VITALS — BP 162/64 | HR 51

## 2015-06-28 DIAGNOSIS — E875 Hyperkalemia: Secondary | ICD-10-CM

## 2015-06-28 DIAGNOSIS — I10 Essential (primary) hypertension: Secondary | ICD-10-CM

## 2015-06-28 LAB — COMPREHENSIVE METABOLIC PANEL
ALT: 17 U/L (ref 0–35)
AST: 26 U/L (ref 0–37)
Albumin: 3.9 g/dL (ref 3.5–5.2)
Alkaline Phosphatase: 47 U/L (ref 39–117)
BILIRUBIN TOTAL: 0.4 mg/dL (ref 0.2–1.2)
BUN: 50 mg/dL — AB (ref 6–23)
CO2: 29 meq/L (ref 19–32)
CREATININE: 2.33 mg/dL — AB (ref 0.40–1.20)
Calcium: 10.8 mg/dL — ABNORMAL HIGH (ref 8.4–10.5)
Chloride: 104 mEq/L (ref 96–112)
GFR: 21.98 mL/min — AB (ref >60.00)
GLUCOSE: 83 mg/dL (ref 70–99)
Potassium: 4.9 mEq/L (ref 3.5–5.1)
SODIUM: 138 meq/L (ref 135–145)
TOTAL PROTEIN: 6.5 g/dL (ref 6.0–8.3)

## 2015-06-28 MED ORDER — CARVEDILOL 3.125 MG PO TABS: 3.1250 mg | ORAL_TABLET | Freq: Two times a day (BID) | ORAL | Status: DC

## 2015-06-28 MED ORDER — LISINOPRIL 2.5 MG PO TABS: 2.5000 mg | ORAL_TABLET | Freq: Two times a day (BID) | ORAL | Status: DC

## 2015-06-28 NOTE — Progress Notes (Signed)
Pre visit review using our clinic review tool, if applicable. No additional management support is needed unless otherwise documented below in the visit note.  06/24/15 CMP Result Note: in next 2 days needs a bp recheck and a cmp  Per Dr. Charlett Blake: Decrease lisinopril to 2.5 mg twice daily. Stop taking atenolol. Start taking carvedilol 3.125 mg twice daily. Check labs today. Return for nurse visit BP check in 7-10 days.  Medication filled to pharmacy as requested. Pt verbalized understanding of instructions.   Next appt: 07/05/15  Dorrene German, RN

## 2015-06-28 NOTE — Progress Notes (Signed)
RN blood pressure check note reviewed. Agree with documention and plan. 

## 2015-06-28 NOTE — Patient Instructions (Signed)
Per Dr. Charlett Blake: Decrease lisinopril to 2.5 mg twice daily. Stop taking atenolol. Start taking carvedilol 3.125 mg twice daily. Check labs today. Return for nurse visit BP check in 7-10 days.

## 2015-07-01 NOTE — Assessment & Plan Note (Signed)
Referred to nephrology and stay well hydrated.

## 2015-07-01 NOTE — Assessment & Plan Note (Signed)
Increase leafy greens, consider increased lean red meat and using cast iron cookware. Continue to monitor, report any concerns 

## 2015-07-01 NOTE — Progress Notes (Signed)
Patient ID: Jillian Hayes, female   DOB: March 13, 1945, 70 y.o.   MRN: [id number]   Subjective:    Patient ID: Jillian Hayes, female    DOB: [birth year], 70 y.o.   MRN: [id number]  Chief Complaint  Patient presents with  . Medication Refill    HPI Patient is in today for follow up on blood pressure. She reports she feels well. No recent illness. She denies any acute concerns. Well controlled, no changes to meds. Encouraged heart healthy diet such as the DASH diet and exercise as tolerated.   Past Medical History  Diagnosis Date  . Allergy     seasonal  . Anemia     iron deficiency  . Depression 1991    hospitalized  . Anxiety   . Hypertension   . Insomnia 06/24/2010  . Overweight(278.02) 06/24/2010  . Fatigue 06/24/2010  . Chronic headaches 06/24/2010  . Multiple chemical sensitivity syndrome 06/25/2010  . History of chicken pox 06/25/2010  . Bladder polyps 06/25/2010  . Renal insufficiency 03/26/2011  . Benign fundic gland polyps of stomach   . Proteinuria 02/18/2014  . Hypercalcemia 02/18/2014  . Medicare annual wellness visit, subsequent 11/09/2011  . Advanced care planning/counseling discussion 06/10/2014    05/31/2014 patient presents copy of HCP and Living Will    Past Surgical History  Procedure Laterality Date  . Polyps on bladder removed  1972    benign  . Cyst on left breast removed Left 1991    benign  . Nasal septum surgery  1986    rhinoplasty  . Tonsillectomy  1962    Family History  Problem Relation Age of Onset  . Breast cancer Mother 38    left breast removed, 2010 lung  . Nephrolithiasis Father   . Heart attack Father 97    X 3  . Heart disease Father     smoker  . Hypertension Brother   . Melanoma Son 37    melanoma on leg removed  . Pancreatic cancer Maternal Grandmother   . Hypertension Paternal Grandmother   . Obesity Paternal Grandmother   . Diabetes Mother   . Diabetes Maternal Aunt     x 2  . Diabetes Maternal Uncle     x 3  . Heart  attack Paternal Grandfather 83    Social History   Social History  . Marital Status: Married    Spouse Name: N/A  . Number of Children: 2  . Years of Education: N/A   Occupational History  . retired    Social History Main Topics  . Smoking status: Never Smoker   . Smokeless tobacco: Never Used  . Alcohol Use: No  . Drug Use: No  . Sexual Activity:    Partners: Male     Comment: lives with husband, wears with husband   Other Topics Concern  . Not on file   Social History Narrative   Lives with husband.      Outpatient Prescriptions Prior to Visit  Medication Sig Dispense Refill  . ALFALFA PO Take 4,500 mg by mouth daily.      Marland Kitchen amLODipine (NORVASC) 5 MG tablet Take 1 tablet (5 mg total) by mouth 2 (two) times daily. 180 tablet 0  . b complex vitamins capsule Take 1 capsule by mouth daily.    . Calcium Carbonate-Vitamin D (CALCIUM + D PO) Take 1,000 mg by mouth daily.      . cholecalciferol (VITAMIN D) 1000 UNITS tablet Take 1,000 Units by  mouth daily.      . Ferrous Fumarate (IRON) 18 MG TBCR Take 1 tablet by mouth daily.      . Flaxseed, Linseed, (FLAX SEED OIL PO) Take 1 capsule by mouth 3 (three) times daily.    . L-GLUTAMINE PO Take 1 capsule by mouth daily.    . Probiotic Product (PROBIOTIC DAILY PO) Take 1 tablet by mouth daily.    . vitamin C (ASCORBIC ACID) 500 MG tablet Take 500 mg by mouth daily as needed.     Marland Kitchen amLODipine (NORVASC) 10 MG tablet TAKE ONE TABLET BY MOUTH ONCE DAILY 90 tablet 0  . atenolol (TENORMIN) 25 MG tablet Take 1 tablet (25 mg total) by mouth daily. 90 tablet 1  . hydrALAZINE (APRESOLINE) 10 MG tablet TAKE ONE TABLET BY MOUTH THREE TIMES DAILY ------  PATIENT  NEEDS  TO  CONTACT  OFFICE  FOR  ADDITIONAL  REFILLS  ------ 90 tablet 0  . lisinopril (PRINIVIL,ZESTRIL) 20 MG tablet Take 1 tablet (20 mg total) by mouth 2 (two) times daily. 180 tablet 0   No facility-administered medications prior to visit.    Allergies  Allergen Reactions    . Sulfa Antibiotics Nausea And Vomiting    Review of Systems  Constitutional: Negative for fever and malaise/fatigue.  HENT: Negative for congestion.   Eyes: Negative for blurred vision.  Respiratory: Negative for shortness of breath.   Cardiovascular: Negative for chest pain, palpitations and leg swelling.  Gastrointestinal: Negative for nausea, abdominal pain and blood in stool.  Genitourinary: Negative for dysuria and frequency.  Musculoskeletal: Negative for falls.  Skin: Negative for rash.  Neurological: Negative for dizziness, loss of consciousness and headaches.  Endo/Heme/Allergies: Negative for environmental allergies.  Psychiatric/Behavioral: Negative for depression. The patient is not nervous/anxious.        Objective:    Physical Exam  Constitutional: She is oriented to person, place, and time. She appears well-developed and well-nourished. No distress.  HENT:  Head: Normocephalic and atraumatic.  Nose: Nose normal.  Eyes: Right eye exhibits no discharge. Left eye exhibits no discharge.  Neck: Normal range of motion. Neck supple.  Cardiovascular: Normal rate and regular rhythm.   Pulmonary/Chest: Effort normal and breath sounds normal.  Abdominal: Soft. Bowel sounds are normal. There is no tenderness.  Musculoskeletal: She exhibits no edema.  Neurological: She is alert and oriented to person, place, and time.  Skin: Skin is warm and dry.  Psychiatric: She has a normal mood and affect.  Nursing note and vitals reviewed.   BP 160/78 mmHg  Pulse 56  Temp(Src) 98.5 F (36.9 C) (Oral)  Ht 5\' 7"  (1.702 m)  Wt 157 lb 4 oz (71.328 kg)  BMI 24.62 kg/m2  SpO2 97% Wt Readings from Last 3 Encounters:  06/24/15 157 lb 4 oz (71.328 kg)  09/28/14 162 lb 4 oz (73.596 kg)  08/16/14 165 lb 6.4 oz (75.025 kg)     Lab Results  Component Value Date   WBC 7.5 06/24/2015   HGB 11.2* 06/24/2015   HCT 33.5* 06/24/2015   PLT 273.0 06/24/2015   GLUCOSE 83 06/28/2015    CHOL 199 06/24/2015   TRIG 126.0 06/24/2015   HDL 78.30 06/24/2015   LDLCALC 95 06/24/2015   ALT 17 06/28/2015   AST 26 06/28/2015   NA 138 06/28/2015   K 4.9 06/28/2015   CL 104 06/28/2015   CREATININE 2.33* 06/28/2015   BUN 50* 06/28/2015   CO2 29 06/28/2015   TSH 0.13*  06/24/2015    Lab Results  Component Value Date   TSH 0.13* 06/24/2015   Lab Results  Component Value Date   WBC 7.5 06/24/2015   HGB 11.2* 06/24/2015   HCT 33.5* 06/24/2015   MCV 82.9 06/24/2015   PLT 273.0 06/24/2015   Lab Results  Component Value Date   NA 138 06/28/2015   K 4.9 06/28/2015   CO2 29 06/28/2015   GLUCOSE 83 06/28/2015   BUN 50* 06/28/2015   CREATININE 2.33* 06/28/2015   BILITOT 0.4 06/28/2015   ALKPHOS 47 06/28/2015   AST 26 06/28/2015   ALT 17 06/28/2015   PROT 6.5 06/28/2015   ALBUMIN 3.9 06/28/2015   CALCIUM 10.8* 06/28/2015   ANIONGAP 13 01/16/2014   GFR 21.98* 06/28/2015   Lab Results  Component Value Date   CHOL 199 06/24/2015   Lab Results  Component Value Date   HDL 78.30 06/24/2015   Lab Results  Component Value Date   LDLCALC 95 06/24/2015   Lab Results  Component Value Date   TRIG 126.0 06/24/2015   Lab Results  Component Value Date   CHOLHDL 3 06/24/2015   No results found for: HGBA1C     Assessment & Plan:   Problem List Items Addressed This Visit    Renal insufficiency - Primary    Referred to nephrology and stay well hydrated.      Relevant Orders   TSH (Completed)   CBC (Completed)   Lipid panel (Completed)   Comprehensive metabolic panel (Completed)   Overweight   Relevant Orders   TSH (Completed)   CBC (Completed)   Lipid panel (Completed)   Comprehensive metabolic panel (Completed)   Hypercalcemia   Relevant Orders   TSH (Completed)   CBC (Completed)   Lipid panel (Completed)   Comprehensive metabolic panel (Completed)   HTN (hypertension)    Poorly controlled, unable to tolerate numerous meds and with renal  insufficiency. Encouraged heart healthy diet such as the DASH diet and exercise as tolerated. She reports she does not tolerate Clonidine.       Relevant Medications   hydrALAZINE (APRESOLINE) 10 MG tablet   Other Relevant Orders   TSH (Completed)   CBC (Completed)   Lipid panel (Completed)   Comprehensive metabolic panel (Completed)   Depression   Relevant Orders   TSH (Completed)   CBC (Completed)   Lipid panel (Completed)   Comprehensive metabolic panel (Completed)   Anemia    Increase leafy greens, consider increased lean red meat and using cast iron cookware. Continue to monitor, report any concerns      Relevant Orders   TSH (Completed)   CBC (Completed)   Lipid panel (Completed)   Comprehensive metabolic panel (Completed)      I have discontinued Ms. Saw atenolol and lisinopril. I have also changed her hydrALAZINE. Additionally, I am having her maintain her cholecalciferol, Calcium Carbonate-Vitamin D (CALCIUM + D PO), ALFALFA PO, vitamin C, Iron, (Flaxseed, Linseed, (FLAX SEED OIL PO)), b complex vitamins, L-GLUTAMINE PO, Probiotic Product (PROBIOTIC DAILY PO), and amLODipine.  Meds ordered this encounter  Medications  . hydrALAZINE (APRESOLINE) 10 MG tablet    Sig: TAKE ONE TABLET BY MOUTH THREE TIMES DAILY    Dispense:  270 tablet    Refill:  1  . DISCONTD: lisinopril (PRINIVIL,ZESTRIL) 10 MG tablet    Sig: Take 1 tablet (10 mg total) by mouth 2 (two) times daily.    Dispense:  180 tablet    Refill:  1  . DISCONTD: atenolol (TENORMIN) 25 MG tablet    Sig: Take 1 tablet (25 mg total) by mouth daily.    Dispense:  90 tablet    Refill:  1     [name], MD

## 2015-07-01 NOTE — Assessment & Plan Note (Signed)
Poorly controlled, unable to tolerate numerous meds and with renal insufficiency. Encouraged heart healthy diet such as the DASH diet and exercise as tolerated. She reports she does not tolerate Clonidine.

## 2015-07-03 DIAGNOSIS — N185 Chronic kidney disease, stage 5: Secondary | ICD-10-CM | POA: Insufficient documentation

## 2015-07-05 ENCOUNTER — Ambulatory Visit (INDEPENDENT_AMBULATORY_CARE_PROVIDER_SITE_OTHER): Payer: Medicare Other | Admitting: Family Medicine

## 2015-07-05 VITALS — BP 139/57 | HR 65

## 2015-07-05 DIAGNOSIS — I10 Essential (primary) hypertension: Secondary | ICD-10-CM

## 2015-07-05 NOTE — Progress Notes (Signed)
RN blood pressure check note reviewed. Agree with documention and plan. 

## 2015-07-05 NOTE — Progress Notes (Signed)
Pre visit review using our clinic tool,if applicable. No additional management support is needed unless otherwise documented below in the visit note.   Patient in for BP recheck due to BP elevation on last visit.  BP today = 139/57 Pulse=65   Per Dr. Charlett Blake after review patient to continue BP medications as ordered. Maintain low sodium diet and return for follow up on 07/23/15.  RN blood  Pressure check note reviewed. Agree with documention and plan.

## 2015-07-23 ENCOUNTER — Ambulatory Visit: Payer: Medicare Other | Admitting: Family Medicine

## 2015-08-01 ENCOUNTER — Encounter: Payer: Self-pay | Admitting: Family Medicine

## 2015-08-01 ENCOUNTER — Telehealth: Payer: Self-pay | Admitting: Family Medicine

## 2015-08-01 ENCOUNTER — Telehealth: Payer: Self-pay

## 2015-08-01 ENCOUNTER — Ambulatory Visit (INDEPENDENT_AMBULATORY_CARE_PROVIDER_SITE_OTHER): Payer: Medicare Other | Admitting: Family Medicine

## 2015-08-01 VITALS — BP 138/76 | HR 69 | Temp 98.1°F | Ht 67.0 in | Wt 156.0 lb

## 2015-08-01 DIAGNOSIS — R946 Abnormal results of thyroid function studies: Secondary | ICD-10-CM | POA: Diagnosis not present

## 2015-08-01 DIAGNOSIS — E663 Overweight: Secondary | ICD-10-CM | POA: Diagnosis not present

## 2015-08-01 DIAGNOSIS — I10 Essential (primary) hypertension: Secondary | ICD-10-CM

## 2015-08-01 DIAGNOSIS — R7989 Other specified abnormal findings of blood chemistry: Secondary | ICD-10-CM

## 2015-08-01 DIAGNOSIS — N289 Disorder of kidney and ureter, unspecified: Secondary | ICD-10-CM

## 2015-08-01 DIAGNOSIS — J069 Acute upper respiratory infection, unspecified: Secondary | ICD-10-CM

## 2015-08-01 LAB — COMPREHENSIVE METABOLIC PANEL
ALBUMIN: 3.8 g/dL (ref 3.5–5.2)
ALK PHOS: 49 U/L (ref 39–117)
ALT: 15 U/L (ref 0–35)
AST: 25 U/L (ref 0–37)
BUN: 29 mg/dL — AB (ref 6–23)
CALCIUM: 11.1 mg/dL — AB (ref 8.4–10.5)
CO2: 27 mEq/L (ref 19–32)
CREATININE: 1.76 mg/dL — AB (ref 0.40–1.20)
Chloride: 106 mEq/L (ref 96–112)
GFR: 30.37 mL/min — ABNORMAL LOW (ref >60.00)
Glucose, Bld: 86 mg/dL (ref 70–99)
POTASSIUM: 4.8 meq/L (ref 3.5–5.1)
SODIUM: 139 meq/L (ref 135–145)
TOTAL PROTEIN: 7 g/dL (ref 6.0–8.3)
Total Bilirubin: 0.4 mg/dL (ref 0.2–1.2)

## 2015-08-01 LAB — CBC
HCT: 33.7 % — ABNORMAL LOW (ref 36.0–46.0)
HEMOGLOBIN: 11.2 g/dL — AB (ref 12.0–15.0)
MCHC: 33.2 g/dL (ref 30.0–36.0)
MCV: 82.3 fl (ref 78.0–100.0)
Platelets: 301 10*3/uL (ref 150.0–400.0)
RBC: 4.1 Mil/uL (ref 3.87–5.11)
RDW: 15 % (ref 11.5–15.5)
WBC: 6.9 10*3/uL (ref 4.0–10.5)

## 2015-08-01 LAB — T4, FREE: Free T4: 0.93 ng/dL (ref 0.60–1.60)

## 2015-08-01 LAB — TSH: TSH: 0.55 u[IU]/mL (ref 0.35–4.50)

## 2015-08-01 MED ORDER — AMOXICILLIN 500 MG PO CAPS: 500.0000 mg | ORAL_CAPSULE | Freq: Three times a day (TID) | ORAL | Status: DC

## 2015-08-01 MED ORDER — HYDRALAZINE HCL 10 MG PO TABS: ORAL_TABLET | ORAL | Status: DC

## 2015-08-01 MED ORDER — LISINOPRIL 5 MG PO TABS: 5.0000 mg | ORAL_TABLET | Freq: Every day | ORAL | Status: DC

## 2015-08-01 NOTE — Telephone Encounter (Signed)
Corrected medication list and patient informed.

## 2015-08-01 NOTE — Assessment & Plan Note (Signed)
Function improved some from last visit and she is following with nephrology now will request records

## 2015-08-01 NOTE — Assessment & Plan Note (Signed)
Still elevated despite stopping supplements. Will request records from nephrology if no work up will proceed. With ionized calcium, pth etc

## 2015-08-01 NOTE — Assessment & Plan Note (Signed)
Mild and likely viral. Encouraged increased rest and hydration, add probiotics, zinc such as Coldeze or Xicam. Treat fevers as needed. Is given a paper copy of Amoxicillin to see if she worsens

## 2015-08-01 NOTE — Progress Notes (Signed)
Pre visit review using our clinic review tool, if applicable. No additional management support is needed unless otherwise documented below in the visit note. 

## 2015-08-01 NOTE — Telephone Encounter (Signed)
-----   Message from [name], MD sent at 08/01/2015  1:24 PM EDT ----- So her calcium is still elevated, i have requested records from nephrology once I get them if they do not have certain things I may want to do further labs

## 2015-08-01 NOTE — Assessment & Plan Note (Signed)
Well controlled, no changes to meds. Encouraged heart healthy diet such as the DASH diet and exercise as tolerated.  °

## 2015-08-01 NOTE — Assessment & Plan Note (Signed)
Encouraged DASH diet, decrease po intake and increase exercise as tolerated. Needs 7-8 hours of sleep nightly. Avoid trans fats, eat small, frequent meals

## 2015-08-01 NOTE — Progress Notes (Signed)
Patient ID: Jillian Hayes, female   DOB: [birth year], 70 y.o.   MRN: [id number]   Subjective:    Patient ID: Jillian Hayes, female    DOB: [birth year], 70 y.o.   MRN: [id number]  Chief Complaint  Patient presents with  . Follow-up    blood pressure    HPI Patient is in today for follow up. She is struggling with some fatigue, sore throat, cough and congestion this week. Felt well last week. Is established with nephrology now. They discussed cutting her Hydralazine and upping her lisinopril but did not do so. Denies CP/palp/SOB/HA/congestion/fevers/GI or GU c/o. Taking meds as prescribed  Past Medical History  Diagnosis Date  . Allergy     seasonal  . Anemia     iron deficiency  . Depression 1991    hospitalized  . Anxiety   . Hypertension   . Insomnia 06/24/2010  . Overweight(278.02) 06/24/2010  . Fatigue 06/24/2010  . Chronic headaches 06/24/2010  . Multiple chemical sensitivity syndrome 06/25/2010  . History of chicken pox 06/25/2010  . Bladder polyps 06/25/2010  . Renal insufficiency 03/26/2011  . Benign fundic gland polyps of stomach   . Proteinuria 02/18/2014  . Hypercalcemia 02/18/2014  . Medicare annual wellness visit, subsequent 11/09/2011  . Advanced care planning/counseling discussion 06/10/2014    05/31/2014 patient presents copy of HCP and Living Will    Past Surgical History  Procedure Laterality Date  . Polyps on bladder removed  1972    benign  . Cyst on left breast removed Left 1991    benign  . Nasal septum surgery  1986    rhinoplasty  . Tonsillectomy  1962    Family History  Problem Relation Age of Onset  . Breast cancer Mother 50    left breast removed, 2010 lung  . Nephrolithiasis Father   . Heart attack Father 15    X 3  . Heart disease Father     smoker  . Hypertension Brother   . Melanoma Son 37    melanoma on leg removed  . Pancreatic cancer Maternal Grandmother   . Hypertension Paternal Grandmother   . Obesity Paternal Grandmother   .  Diabetes Mother   . Diabetes Maternal Aunt     x 2  . Diabetes Maternal Uncle     x 3  . Heart attack Paternal Grandfather 66    Social History   Social History  . Marital Status: Married    Spouse Name: N/A  . Number of Children: 2  . Years of Education: N/A   Occupational History  . retired    Social History Main Topics  . Smoking status: Never Smoker   . Smokeless tobacco: Never Used  . Alcohol Use: No  . Drug Use: No  . Sexual Activity:    Partners: Male     Comment: lives with husband, wears with husband   Other Topics Concern  . Not on file   Social History Narrative   Lives with husband.      Outpatient Prescriptions Prior to Visit  Medication Sig Dispense Refill  . ALFALFA PO Take 4,500 mg by mouth daily.      Marland Kitchen amLODipine (NORVASC) 5 MG tablet Take 1 tablet (5 mg total) by mouth 2 (two) times daily. 180 tablet 0  . b complex vitamins capsule Take 1 capsule by mouth daily.    . Calcium Carbonate-Vitamin D (CALCIUM + D PO) Take 1,000 mg by mouth daily.      Marland Kitchen  carvedilol (COREG) 3.125 MG tablet Take 1 tablet (3.125 mg total) by mouth 2 (two) times daily. 60 tablet 1  . cholecalciferol (VITAMIN D) 1000 UNITS tablet Take 1,000 Units by mouth daily.      . Ferrous Fumarate (IRON) 18 MG TBCR Take 1 tablet by mouth daily.      . Flaxseed, Linseed, (FLAX SEED OIL PO) Take 1 capsule by mouth 3 (three) times daily.    . L-GLUTAMINE PO Take 1 capsule by mouth daily.    . Probiotic Product (PROBIOTIC DAILY PO) Take 1 tablet by mouth daily.    . vitamin C (ASCORBIC ACID) 500 MG tablet Take 500 mg by mouth daily as needed.     . hydrALAZINE (APRESOLINE) 10 MG tablet TAKE ONE TABLET BY MOUTH THREE TIMES DAILY 270 tablet 1  . lisinopril (PRINIVIL,ZESTRIL) 2.5 MG tablet Take 1 tablet (2.5 mg total) by mouth 2 (two) times daily. 60 tablet 1   No facility-administered medications prior to visit.    Allergies  Allergen Reactions  . Sulfa Antibiotics Nausea And Vomiting     Review of Systems  Constitutional: Positive for malaise/fatigue. Negative for fever.  HENT: Positive for congestion.   Eyes: Negative for blurred vision.  Respiratory: Negative for shortness of breath.   Cardiovascular: Negative for chest pain, palpitations and leg swelling.  Gastrointestinal: Negative for nausea, abdominal pain and blood in stool.  Genitourinary: Negative for dysuria and frequency.  Musculoskeletal: Negative for falls.  Skin: Negative for rash.  Neurological: Negative for dizziness, loss of consciousness and headaches.  Endo/Heme/Allergies: Negative for environmental allergies.  Psychiatric/Behavioral: Negative for depression. The patient is not nervous/anxious.        Objective:    Physical Exam  Constitutional: She is oriented to person, place, and time. She appears well-developed and well-nourished. No distress.  HENT:  Head: Normocephalic and atraumatic.  Nose: Nose normal.  Eyes: Right eye exhibits no discharge. Left eye exhibits no discharge.  Neck: Normal range of motion. Neck supple.  Cardiovascular: Normal rate and regular rhythm.   No murmur heard. Pulmonary/Chest: Effort normal and breath sounds normal.  Abdominal: Soft. Bowel sounds are normal. There is no tenderness.  Musculoskeletal: She exhibits no edema.  Neurological: She is alert and oriented to person, place, and time.  Skin: Skin is warm and dry.  Psychiatric: She has a normal mood and affect.  Vitals reviewed.   BP 138/76 mmHg  Pulse 69  Temp(Src) 98.1 F (36.7 C) (Oral)  Ht 5\' 7"  (1.702 m)  Wt 156 lb (70.761 kg)  BMI 24.43 kg/m2  SpO2 97% Wt Readings from Last 3 Encounters:  08/01/15 156 lb (70.761 kg)  06/24/15 157 lb 4 oz (71.328 kg)  09/28/14 162 lb 4 oz (73.596 kg)     Lab Results  Component Value Date   WBC 6.9 08/01/2015   HGB 11.2* 08/01/2015   HCT 33.7* 08/01/2015   PLT 301.0 08/01/2015   GLUCOSE 86 08/01/2015   CHOL 199 06/24/2015   TRIG 126.0 06/24/2015    HDL 78.30 06/24/2015   LDLCALC 95 06/24/2015   ALT 15 08/01/2015   AST 25 08/01/2015   NA 139 08/01/2015   K 4.8 08/01/2015   CL 106 08/01/2015   CREATININE 1.76* 08/01/2015   BUN 29* 08/01/2015   CO2 27 08/01/2015   TSH 0.55 08/01/2015    Lab Results  Component Value Date   TSH 0.55 08/01/2015   Lab Results  Component Value Date   WBC 6.9  08/01/2015   HGB 11.2* 08/01/2015   HCT 33.7* 08/01/2015   MCV 82.3 08/01/2015   PLT 301.0 08/01/2015   Lab Results  Component Value Date   NA 139 08/01/2015   K 4.8 08/01/2015   CO2 27 08/01/2015   GLUCOSE 86 08/01/2015   BUN 29* 08/01/2015   CREATININE 1.76* 08/01/2015   BILITOT 0.4 08/01/2015   ALKPHOS 49 08/01/2015   AST 25 08/01/2015   ALT 15 08/01/2015   PROT 7.0 08/01/2015   ALBUMIN 3.8 08/01/2015   CALCIUM 11.1* 08/01/2015   ANIONGAP 13 01/16/2014   GFR 30.37* 08/01/2015   Lab Results  Component Value Date   CHOL 199 06/24/2015   Lab Results  Component Value Date   HDL 78.30 06/24/2015   Lab Results  Component Value Date   LDLCALC 95 06/24/2015   Lab Results  Component Value Date   TRIG 126.0 06/24/2015   Lab Results  Component Value Date   CHOLHDL 3 06/24/2015   No results found for: HGBA1C     Assessment & Plan:   Problem List Items Addressed This Visit    URI, acute    Mild and likely viral. Encouraged increased rest and hydration, add probiotics, zinc such as Coldeze or Xicam. Treat fevers as needed. Is given a paper copy of Amoxicillin to see if she worsens      Renal insufficiency    Function improved some from last visit and she is following with nephrology now will request records      Relevant Medications   amoxicillin (AMOXIL) 500 MG capsule   Other Relevant Orders   Comprehensive metabolic panel (Completed)   CBC (Completed)   Overweight    Encouraged DASH diet, decrease po intake and increase exercise as tolerated. Needs 7-8 hours of sleep nightly. Avoid trans fats, eat small,  frequent meals      Hypercalcemia    Still elevated despite stopping supplements. Will request records from nephrology if no work up will proceed. With ionized calcium, pth etc      HTN (hypertension) - Primary    Well controlled, no changes to meds. Encouraged heart healthy diet such as the DASH diet and exercise as tolerated.       Relevant Medications   amoxicillin (AMOXIL) 500 MG capsule   hydrALAZINE (APRESOLINE) 10 MG tablet   lisinopril (PRINIVIL,ZESTRIL) 5 MG tablet   Other Relevant Orders   Comprehensive metabolic panel (Completed)   CBC (Completed)    Other Visit Diagnoses    Abnormal thyroid blood test        Relevant Orders    TSH (Completed)    T4, free (Completed)       I have discontinued Ms. Mohlman lisinopril. I am also having her start on amoxicillin and lisinopril. Additionally, I am having her maintain her cholecalciferol, Calcium Carbonate-Vitamin D (CALCIUM + D PO), ALFALFA PO, vitamin C, Iron, (Flaxseed, Linseed, (FLAX SEED OIL PO)), b complex vitamins, L-GLUTAMINE PO, Probiotic Product (PROBIOTIC DAILY PO), amLODipine, carvedilol, and hydrALAZINE.  Meds ordered this encounter  Medications  . amoxicillin (AMOXIL) 500 MG capsule    Sig: Take 1 capsule (500 mg total) by mouth 3 (three) times daily.    Dispense:  30 capsule    Refill:  0  . hydrALAZINE (APRESOLINE) 10 MG tablet    Sig: TAKE ONE TABLET BY MOUTH THREE TIMES DAILY    Dispense:  180 tablet    Refill:  1  . lisinopril (PRINIVIL,ZESTRIL) 5 MG tablet  Sig: Take 1 tablet (5 mg total) by mouth daily.    Dispense:  180 tablet    Refill:  1     [name], MD

## 2015-08-01 NOTE — Telephone Encounter (Signed)
Patient called about medication list being different from what on the medication list than how dr Charlett Blake ordered it.  Hydalazine twice a day paper says three time and lisinopril 5 mg 2x day but paper says once . Patient needs clarification on what the correct dosing is [phone]

## 2015-08-01 NOTE — Patient Instructions (Addendum)
Encouraged increased rest and hydration, add probiotics, zinc such as Coldeze or Xicam. Treat fevers as needed. Plain Mucinex 2 x a day x 10 days. Aged or black garlic and/or Elderberry liquid   Hypertension Hypertension, commonly called high blood pressure, is when the force of blood pumping through your arteries is too strong. Your arteries are the blood vessels that carry blood from your heart throughout your body. A blood pressure reading consists of a higher number over a lower number, such as 110/72. The higher number (systolic) is the pressure inside your arteries when your heart pumps. The lower number (diastolic) is the pressure inside your arteries when your heart relaxes. Ideally you want your blood pressure below 120/80. Hypertension forces your heart to work harder to pump blood. Your arteries may become narrow or stiff. Having untreated or uncontrolled hypertension can cause heart attack, stroke, kidney disease, and other problems. RISK FACTORS Some risk factors for high blood pressure are controllable. Others are not.  Risk factors you cannot control include:   Race. You may be at higher risk if you are African American.  Age. Risk increases with age.  Gender. Men are at higher risk than women before age 21 years. After age 74, women are at higher risk than men. Risk factors you can control include:  Not getting enough exercise or physical activity.  Being overweight.  Getting too much fat, sugar, calories, or salt in your diet.  Drinking too much alcohol. SIGNS AND SYMPTOMS Hypertension does not usually cause signs or symptoms. Extremely high blood pressure (hypertensive crisis) may cause headache, anxiety, shortness of breath, and nosebleed. DIAGNOSIS To check if you have hypertension, your health care provider will measure your blood pressure while you are seated, with your arm held at the level of your heart. It should be measured at least twice using the same arm.  Certain conditions can cause a difference in blood pressure between your right and left arms. A blood pressure reading that is higher than normal on one occasion does not mean that you need treatment. If it is not clear whether you have high blood pressure, you may be asked to return on a different day to have your blood pressure checked again. Or, you may be asked to monitor your blood pressure at home for 1 or more weeks. TREATMENT Treating high blood pressure includes making lifestyle changes and possibly taking medicine. Living a healthy lifestyle can help lower high blood pressure. You may need to change some of your habits. Lifestyle changes may include:  Following the DASH diet. This diet is high in fruits, vegetables, and whole grains. It is low in salt, red meat, and added sugars.  Keep your sodium intake below 2,300 mg per day.  Getting at least 30-45 minutes of aerobic exercise at least 4 times per week.  Losing weight if necessary.  Not smoking.  Limiting alcoholic beverages.  Learning ways to reduce stress. Your health care provider may prescribe medicine if lifestyle changes are not enough to get your blood pressure under control, and if one of the following is true:  You are 72-84 years of age and your systolic blood pressure is above 140.  You are 53 years of age or older, and your systolic blood pressure is above 150.  Your diastolic blood pressure is above 90.  You have diabetes, and your systolic blood pressure is over [id number] or your diastolic blood pressure is over 90.  You have kidney disease and your blood pressure  is above 140/90.  You have heart disease and your blood pressure is above 140/90. Your personal target blood pressure may vary depending on your medical conditions, your age, and other factors. HOME CARE INSTRUCTIONS  Have your blood pressure rechecked as directed by your health care provider.   Take medicines only as directed by your health care  provider. Follow the directions carefully. Blood pressure medicines must be taken as prescribed. The medicine does not work as well when you skip doses. Skipping doses also puts you at risk for problems.  Do not smoke.   Monitor your blood pressure at home as directed by your health care provider. SEEK MEDICAL CARE IF:   You think you are having a reaction to medicines taken.  You have recurrent headaches or feel dizzy.  You have swelling in your ankles.  You have trouble with your vision. SEEK IMMEDIATE MEDICAL CARE IF:  You develop a severe headache or confusion.  You have unusual weakness, numbness, or feel faint.  You have severe chest or abdominal pain.  You vomit repeatedly.  You have trouble breathing. MAKE SURE YOU:   Understand these instructions.  Will watch your condition.  Will get help right away if you are not doing well or get worse.   This information is not intended to replace advice given to you by your health care provider. Make sure you discuss any questions you have with your health care provider.   Document Released: 01/19/2005 Document Revised: 06/05/2014 Document Reviewed: 11/11/2012 Elsevier Interactive Patient Education 2016 [employer].

## 2015-08-01 NOTE — Telephone Encounter (Signed)
She is correct i did say bid but must have corrected the sig: please change sig and send in appropriate amount

## 2015-08-08 ENCOUNTER — Telehealth: Payer: Self-pay | Admitting: Family Medicine

## 2015-08-08 NOTE — Telephone Encounter (Signed)
Relation to [id number] Call back [place] [phone] (Phone) [phone] (Fax)         Reason for call:  Patient requesting a 3 month supply of lisinopril (PRINIVIL,ZESTRIL) 5 MG tablet, patient states PCP changed the direction she is running out sooner. Patient is going out of town.

## 2015-08-09 MED ORDER — LISINOPRIL 5 MG PO TABS: 5.0000 mg | ORAL_TABLET | Freq: Two times a day (BID) | ORAL | Status: DC

## 2015-08-09 NOTE — Telephone Encounter (Signed)
Prescription sent in as PCP instructed.

## 2015-08-21 NOTE — Telephone Encounter (Signed)
[facility], Dr Audie Clear calling and states all records are in [facility]. Call back if you have questions [phone]

## 2015-08-21 NOTE — Telephone Encounter (Addendum)
Sent Dr. Katherine Mantle for when she returns. Printed records for viewing.

## 2015-08-22 ENCOUNTER — Other Ambulatory Visit: Payer: Self-pay | Admitting: Family Medicine

## 2015-09-11 LAB — HM MAMMOGRAPHY

## 2015-09-13 ENCOUNTER — Encounter: Payer: Self-pay | Admitting: Family Medicine

## 2015-09-13 ENCOUNTER — Other Ambulatory Visit: Payer: Self-pay | Admitting: Family Medicine

## 2015-09-13 ENCOUNTER — Ambulatory Visit (INDEPENDENT_AMBULATORY_CARE_PROVIDER_SITE_OTHER): Payer: Medicare Other | Admitting: Family Medicine

## 2015-09-13 VITALS — BP 120/82 | HR 73 | Temp 97.9°F | Ht 67.0 in | Wt 157.1 lb

## 2015-09-13 DIAGNOSIS — I10 Essential (primary) hypertension: Secondary | ICD-10-CM

## 2015-09-13 DIAGNOSIS — N289 Disorder of kidney and ureter, unspecified: Secondary | ICD-10-CM

## 2015-09-13 DIAGNOSIS — K625 Hemorrhage of anus and rectum: Secondary | ICD-10-CM

## 2015-09-13 DIAGNOSIS — D649 Anemia, unspecified: Secondary | ICD-10-CM

## 2015-09-13 DIAGNOSIS — E663 Overweight: Secondary | ICD-10-CM

## 2015-09-13 DIAGNOSIS — Z0000 Encounter for general adult medical examination without abnormal findings: Secondary | ICD-10-CM | POA: Diagnosis not present

## 2015-09-13 DIAGNOSIS — G4700 Insomnia, unspecified: Secondary | ICD-10-CM

## 2015-09-13 LAB — COMPREHENSIVE METABOLIC PANEL
ALBUMIN: 4 g/dL (ref 3.5–5.2)
ALK PHOS: 53 U/L (ref 39–117)
ALT: 19 U/L (ref 0–35)
AST: 28 U/L (ref 0–37)
BUN: 43 mg/dL — ABNORMAL HIGH (ref 6–23)
CALCIUM: 10.7 mg/dL — AB (ref 8.4–10.5)
CHLORIDE: 106 meq/L (ref 96–112)
CO2: 28 mEq/L (ref 19–32)
CREATININE: 2 mg/dL — AB (ref 0.40–1.20)
GFR: 26.2 mL/min — ABNORMAL LOW (ref >60.00)
Glucose, Bld: 85 mg/dL (ref 70–99)
POTASSIUM: 4.9 meq/L (ref 3.5–5.1)
Sodium: 139 mEq/L (ref 135–145)
TOTAL PROTEIN: 6.8 g/dL (ref 6.0–8.3)
Total Bilirubin: 0.4 mg/dL (ref 0.2–1.2)

## 2015-09-13 LAB — CBC
HEMATOCRIT: 32.7 % — AB (ref 36.0–46.0)
Hemoglobin: 10.9 g/dL — ABNORMAL LOW (ref 12.0–15.0)
MCHC: 33.4 g/dL (ref 30.0–36.0)
MCV: 83.5 fl (ref 78.0–100.0)
Platelets: 242 10*3/uL (ref 150.0–400.0)
RBC: 3.91 Mil/uL (ref 3.87–5.11)
RDW: 15.8 % — ABNORMAL HIGH (ref 11.5–15.5)
WBC: 5.1 10*3/uL (ref 4.0–10.5)

## 2015-09-13 MED ORDER — AMLODIPINE BESYLATE 5 MG PO TABS: 5.0000 mg | ORAL_TABLET | Freq: Two times a day (BID) | ORAL | 1 refills | Status: DC

## 2015-09-13 NOTE — Progress Notes (Signed)
Pre visit review using our clinic review tool, if applicable. No additional management support is needed unless otherwise documented below in the visit note. 

## 2015-09-13 NOTE — Patient Instructions (Signed)
Anemia, Nonspecific Anemia is a condition in which the concentration of red blood cells or hemoglobin in the blood is below normal. Hemoglobin is a substance in red blood cells that carries oxygen to the tissues of the body. Anemia results in not enough oxygen reaching these tissues.  CAUSES  Common causes of anemia include:   Excessive bleeding. Bleeding may be internal or external. This includes excessive bleeding from periods (in women) or from the intestine.   Poor nutrition.   Chronic kidney, thyroid, and liver disease.  Bone marrow disorders that decrease red blood cell production.  Cancer and treatments for cancer.  HIV, AIDS, and their treatments.  Spleen problems that increase red blood cell destruction.  Blood disorders.  Excess destruction of red blood cells due to infection, medicines, and autoimmune disorders. SIGNS AND SYMPTOMS   Minor weakness.   Dizziness.   Headache.  Palpitations.   Shortness of breath, especially with exercise.   Paleness.  Cold sensitivity.  Indigestion.  Nausea.  Difficulty sleeping.  Difficulty concentrating. Symptoms may occur suddenly or they may develop slowly.  DIAGNOSIS  Additional blood tests are often needed. These help your health care provider determine the best treatment. Your health care provider will check your stool for blood and look for other causes of blood loss.  TREATMENT  Treatment varies depending on the cause of the anemia. Treatment can include:   Supplements of iron, vitamin B12, or folic acid.   Hormone medicines.   A blood transfusion. This may be needed if blood loss is severe.   Hospitalization. This may be needed if there is significant continual blood loss.   Dietary changes.  Spleen removal. HOME CARE INSTRUCTIONS Keep all follow-up appointments. It often takes many weeks to correct anemia, and having your health care provider check on your condition and your response to  treatment is very important. SEEK IMMEDIATE MEDICAL CARE IF:   You develop extreme weakness, shortness of breath, or chest pain.   You become dizzy or have trouble concentrating.  You develop heavy vaginal bleeding.   You develop a rash.   You have bloody or black, tarry stools.   You faint.   You vomit up blood.   You vomit repeatedly.   You have abdominal pain.  You have a fever or persistent symptoms for more than 2-3 days.   You have a fever and your symptoms suddenly get worse.   You are dehydrated.  MAKE SURE YOU:  Understand these instructions.  Will watch your condition.  Will get help right away if you are not doing well or get worse.   This information is not intended to replace advice given to you by your health care provider. Make sure you discuss any questions you have with your health care provider.   Document Released: 02/27/2004 Document Revised: 09/21/2012 Document Reviewed: 07/15/2012 Elsevier Interactive Patient Education 2016 Elsevier Inc.  

## 2015-09-14 LAB — HEPATITIS C ANTIBODY: HCV AB: NEGATIVE

## 2015-09-19 ENCOUNTER — Encounter: Payer: Self-pay | Admitting: Family Medicine

## 2015-09-22 NOTE — Assessment & Plan Note (Signed)
Encouraged DASH diet, decrease po intake and increase exercise as tolerated. Needs 7-8 hours of sleep nightly. Avoid trans fats, eat small, frequent meals every 4-5 hours with lean proteins, complex carbs and healthy fats. Minimize simple carbs, GMO foods. 

## 2015-09-22 NOTE — Progress Notes (Signed)
Patient ID: Jillian Hayes, female   DOB: Feb 06, 1945, 70 y.o.   MRN: [id number]   Subjective:    Patient ID: Jillian Hayes, female    DOB: 03-Oct-1945, 70 y.o.   MRN: [id number]  Chief Complaint  Patient presents with  . Follow-up    HPI Patient is in today for follow up. She is feeling well today. Declines pneumonia shot today but reports she might be willing to take shot when she returns in October. No recent hospitalizaiton or acute illness. Denies CP/palp/SOB/HA/congestion/fevers/GI or GU c/o. Taking meds as prescribed  Past Medical History:  Diagnosis Date  . Advanced care planning/counseling discussion 06/10/2014   05/31/2014 patient presents copy of HCP and Living Will  . Allergy    seasonal  . Anemia    iron deficiency  . Anxiety   . Benign fundic gland polyps of stomach   . Bladder polyps 06/25/2010  . Chronic headaches 06/24/2010  . Depression 1991   hospitalized  . Fatigue 06/24/2010  . History of chicken pox 06/25/2010  . Hypercalcemia 02/18/2014  . Hypertension   . Insomnia 06/24/2010  . Medicare annual wellness visit, subsequent 11/09/2011  . Multiple chemical sensitivity syndrome 06/25/2010  . Overweight(278.02) 06/24/2010  . Proteinuria 02/18/2014  . Renal insufficiency 03/26/2011    Past Surgical History:  Procedure Laterality Date  . cyst on left breast removed Left 1991   benign  . NASAL SEPTUM SURGERY  1986   rhinoplasty  . polyps on bladder removed  1972   benign  . TONSILLECTOMY  1962    Family History  Problem Relation Age of Onset  . Breast cancer Mother 40    left breast removed, 2010 lung  . Nephrolithiasis Father   . Heart attack Father 7    X 3  . Heart disease Father     smoker  . Hypertension Brother   . Melanoma Son 37    melanoma on leg removed  . Pancreatic cancer Maternal Grandmother   . Hypertension Paternal Grandmother   . Obesity Paternal Grandmother   . Diabetes Mother   . Diabetes Maternal Aunt     x 2  . Diabetes Maternal  Uncle     x 3  . Heart attack Paternal Grandfather 81    Social History   Social History  . Marital status: Married    Spouse name: N/A  . Number of children: 2  . Years of education: N/A   Occupational History  . retired    Social History Main Topics  . Smoking status: Never Smoker  . Smokeless tobacco: Never Used  . Alcohol use No  . Drug use: No  . Sexual activity: Yes    Partners: Male     Comment: lives with husband, wears with husband   Other Topics Concern  . Not on file   Social History Narrative   Lives with husband.      Outpatient Medications Prior to Visit  Medication Sig Dispense Refill  . ALFALFA PO Take 4,500 mg by mouth daily.      Marland Kitchen b complex vitamins capsule Take 1 capsule by mouth daily.    . Calcium Carbonate-Vitamin D (CALCIUM + D PO) Take 1,000 mg by mouth daily.      . carvedilol (COREG) 3.125 MG tablet TAKE ONE TABLET BY MOUTH TWICE DAILY 60 tablet 4  . cholecalciferol (VITAMIN D) 1000 UNITS tablet Take 1,000 Units by mouth daily.      . Ferrous Fumarate (IRON)  18 MG TBCR Take 1 tablet by mouth daily.      . Flaxseed, Linseed, (FLAX SEED OIL PO) Take 1 capsule by mouth 3 (three) times daily.    . hydrALAZINE (APRESOLINE) 10 MG tablet TAKE ONE TABLET BY MOUTH THREE TIMES DAILY (Patient taking differently: Take 10 mg by mouth 2 (two) times daily. ) 180 tablet 1  . L-GLUTAMINE PO Take 1 capsule by mouth daily.    Marland Kitchen lisinopril (PRINIVIL,ZESTRIL) 5 MG tablet Take 1 tablet (5 mg total) by mouth 2 (two) times daily. 180 tablet 1  . Probiotic Product (PROBIOTIC DAILY PO) Take 1 tablet by mouth daily.    . vitamin C (ASCORBIC ACID) 500 MG tablet Take 500 mg by mouth daily as needed.     Marland Kitchen amLODipine (NORVASC) 5 MG tablet Take 1 tablet (5 mg total) by mouth 2 (two) times daily. 180 tablet 0  . amoxicillin (AMOXIL) 500 MG capsule Take 1 capsule (500 mg total) by mouth 3 (three) times daily. 30 capsule 0   No facility-administered medications prior to  visit.     Allergies  Allergen Reactions  . Sulfa Antibiotics Nausea And Vomiting    Review of Systems  Constitutional: Negative for fever and malaise/fatigue.  HENT: Negative for congestion.   Eyes: Negative for blurred vision.  Respiratory: Negative for shortness of breath.   Cardiovascular: Negative for chest pain, palpitations and leg swelling.  Gastrointestinal: Negative for abdominal pain, blood in stool and nausea.  Genitourinary: Negative for dysuria and frequency.  Musculoskeletal: Negative for falls.  Skin: Negative for rash.  Neurological: Negative for dizziness, loss of consciousness and headaches.  Endo/Heme/Allergies: Negative for environmental allergies.  Psychiatric/Behavioral: Negative for depression. The patient is not nervous/anxious.        Objective:    Physical Exam  Constitutional: She is oriented to person, place, and time. She appears well-developed and well-nourished. No distress.  HENT:  Head: Normocephalic and atraumatic.  Nose: Nose normal.  Eyes: Right eye exhibits no discharge. Left eye exhibits no discharge.  Neck: Normal range of motion. Neck supple.  Cardiovascular: Normal rate and regular rhythm.   Pulmonary/Chest: Effort normal and breath sounds normal.  Abdominal: Soft. Bowel sounds are normal. There is no tenderness.  Musculoskeletal: She exhibits no edema.  Neurological: She is alert and oriented to person, place, and time.  Skin: Skin is warm and dry.  Psychiatric: She has a normal mood and affect.  Nursing note and vitals reviewed.   BP 120/82 (BP Location: Left Arm, Patient Position: Sitting, Cuff Size: Normal)   Pulse 73   Temp 97.9 F (36.6 C) (Oral)   Ht 5' 7"  (1.702 m)   Wt 157 lb 2 oz (71.3 kg)   BMI 24.61 kg/m  Wt Readings from Last 3 Encounters:  09/13/15 157 lb 2 oz (71.3 kg)  08/01/15 156 lb (70.8 kg)  06/24/15 157 lb 4 oz (71.3 kg)     Lab Results  Component Value Date   WBC 5.1 09/13/2015   HGB 10.9 (L)  09/13/2015   HCT 32.7 (L) 09/13/2015   PLT 242.0 09/13/2015   GLUCOSE 85 09/13/2015   CHOL 199 06/24/2015   TRIG 126.0 06/24/2015   HDL 78.30 06/24/2015   LDLCALC 95 06/24/2015   ALT 19 09/13/2015   AST 28 09/13/2015   NA 139 09/13/2015   K 4.9 09/13/2015   CL 106 09/13/2015   CREATININE 2.00 (H) 09/13/2015   BUN 43 (H) 09/13/2015   CO2 28  09/13/2015   TSH 0.55 08/01/2015    Lab Results  Component Value Date   TSH 0.55 08/01/2015   Lab Results  Component Value Date   WBC 5.1 09/13/2015   HGB 10.9 (L) 09/13/2015   HCT 32.7 (L) 09/13/2015   MCV 83.5 09/13/2015   PLT 242.0 09/13/2015   Lab Results  Component Value Date   NA 139 09/13/2015   K 4.9 09/13/2015   CO2 28 09/13/2015   GLUCOSE 85 09/13/2015   BUN 43 (H) 09/13/2015   CREATININE 2.00 (H) 09/13/2015   BILITOT 0.4 09/13/2015   ALKPHOS 53 09/13/2015   AST 28 09/13/2015   ALT 19 09/13/2015   PROT 6.8 09/13/2015   ALBUMIN 4.0 09/13/2015   CALCIUM 10.7 (H) 09/13/2015   ANIONGAP 13 01/16/2014   GFR 26.20 (L) 09/13/2015   Lab Results  Component Value Date   CHOL 199 06/24/2015   Lab Results  Component Value Date   HDL 78.30 06/24/2015   Lab Results  Component Value Date   LDLCALC 95 06/24/2015   Lab Results  Component Value Date   TRIG 126.0 06/24/2015   Lab Results  Component Value Date   CHOLHDL 3 06/24/2015   No results found for: HGBA1C     Assessment & Plan:   Problem List Items Addressed This Visit    Anemia - Primary    Increase leafy greens, consider increased lean red meat and using cast iron cookware. Continue to monitor, report any concerns      Relevant Orders   CBC (Completed)   Insomnia    Encouraged good sleep hygiene such as dark, quiet room. No blue/green glowing lights such as computer screens in bedroom. No alcohol or stimulants in evening. Cut down on caffeine as able. Regular exercise is helpful but not just prior to bed time.       Overweight    Encouraged DASH  diet, decrease po intake and increase exercise as tolerated. Needs 7-8 hours of sleep nightly. Avoid trans fats, eat small, frequent meals every 4-5 hours with lean proteins, complex carbs and healthy fats. Minimize simple carbs, GMO foods.      Renal insufficiency    Referred to nephrology and encouraged to maintain hydration      Relevant Orders   Comp Met (CMET) (Completed)   HTN (hypertension)    Well controlled, no changes to meds. Encouraged heart healthy diet such as the DASH diet and exercise as tolerated.       Relevant Medications   amLODipine (NORVASC) 5 MG tablet    Other Visit Diagnoses    Preventative health care       Relevant Orders   Hepatitis C Antibody (Completed)      I have discontinued Ms. Cirillo amoxicillin. I am also having her maintain her cholecalciferol, Calcium Carbonate-Vitamin D (CALCIUM + D PO), ALFALFA PO, vitamin C, Iron, (Flaxseed, Linseed, (FLAX SEED OIL PO)), b complex vitamins, L-GLUTAMINE PO, Probiotic Product (PROBIOTIC DAILY PO), hydrALAZINE, lisinopril, carvedilol, and amLODipine.  Meds ordered this encounter  Medications  . amLODipine (NORVASC) 5 MG tablet    Sig: Take 1 tablet (5 mg total) by mouth 2 (two) times daily.    Dispense:  180 tablet    Refill:  1     [name], MD

## 2015-09-22 NOTE — Assessment & Plan Note (Signed)
Encouraged good sleep hygiene such as dark, quiet room. No blue/green glowing lights such as computer screens in bedroom. No alcohol or stimulants in evening. Cut down on caffeine as able. Regular exercise is helpful but not just prior to bed time.  

## 2015-09-22 NOTE — Assessment & Plan Note (Signed)
Well controlled, no changes to meds. Encouraged heart healthy diet such as the DASH diet and exercise as tolerated.  °

## 2015-09-22 NOTE — Assessment & Plan Note (Signed)
Increase leafy greens, consider increased lean red meat and using cast iron cookware. Continue to monitor, report any concerns 

## 2015-09-22 NOTE — Assessment & Plan Note (Signed)
Referred to nephrology and encouraged to maintain hydration

## 2015-11-07 ENCOUNTER — Ambulatory Visit (INDEPENDENT_AMBULATORY_CARE_PROVIDER_SITE_OTHER): Payer: Medicare Other | Admitting: Family Medicine

## 2015-11-07 ENCOUNTER — Encounter: Payer: Self-pay | Admitting: Family Medicine

## 2015-11-07 VITALS — BP 148/78 | HR 64 | Temp 97.9°F | Ht 67.0 in | Wt 154.2 lb

## 2015-11-07 DIAGNOSIS — D649 Anemia, unspecified: Secondary | ICD-10-CM

## 2015-11-07 DIAGNOSIS — G4700 Insomnia, unspecified: Secondary | ICD-10-CM

## 2015-11-07 DIAGNOSIS — I10 Essential (primary) hypertension: Secondary | ICD-10-CM | POA: Diagnosis not present

## 2015-11-07 DIAGNOSIS — N289 Disorder of kidney and ureter, unspecified: Secondary | ICD-10-CM

## 2015-11-07 DIAGNOSIS — N2889 Other specified disorders of kidney and ureter: Secondary | ICD-10-CM

## 2015-11-07 DIAGNOSIS — N184 Chronic kidney disease, stage 4 (severe): Secondary | ICD-10-CM

## 2015-11-07 DIAGNOSIS — R946 Abnormal results of thyroid function studies: Secondary | ICD-10-CM | POA: Diagnosis not present

## 2015-11-07 DIAGNOSIS — E559 Vitamin D deficiency, unspecified: Secondary | ICD-10-CM

## 2015-11-07 NOTE — Assessment & Plan Note (Signed)
Following with nephrology, stable

## 2015-11-07 NOTE — Assessment & Plan Note (Signed)
Has stopped all calcium and is being checked by nephrology

## 2015-11-07 NOTE — Assessment & Plan Note (Signed)
Well controlled, no changes to meds. Encouraged heart healthy diet such as the DASH diet and exercise as tolerated.  °

## 2015-11-07 NOTE — Assessment & Plan Note (Signed)
Encouraged good sleep hygiene such as dark, quiet room. No blue/green glowing lights such as computer screens in bedroom. No alcohol or stimulants in evening. Cut down on caffeine as able. Regular exercise is helpful but not just prior to bed time. Melatonin or L Tryptophan

## 2015-11-07 NOTE — Progress Notes (Signed)
Pre visit review using our clinic review tool, if applicable. No additional management support is needed unless otherwise documented below in the visit note. 

## 2015-11-07 NOTE — Patient Instructions (Signed)
Melatonin 2-10  Mg or L TryptophanInsomnia Insomnia is a sleep disorder that makes it difficult to fall asleep or to stay asleep. Insomnia can cause tiredness (fatigue), low energy, difficulty concentrating, mood swings, and poor performance at work or school.  There are three different ways to classify insomnia:  Difficulty falling asleep.  Difficulty staying asleep.  Waking up too early in the morning. Any type of insomnia can be long-term (chronic) or short-term (acute). Both are common. Short-term insomnia usually lasts for three months or less. Chronic insomnia occurs at least three times a week for longer than three months. CAUSES  Insomnia may be caused by another condition, situation, or substance, such as:  Anxiety.  Certain medicines.  Gastroesophageal reflux disease (GERD) or other gastrointestinal conditions.  Asthma or other breathing conditions.  Restless legs syndrome, sleep apnea, or other sleep disorders.  Chronic pain.  Menopause. This may include hot flashes.  Stroke.  Abuse of alcohol, tobacco, or illegal drugs.  Depression.  Caffeine.   Neurological disorders, such as Alzheimer disease.  An overactive thyroid (hyperthyroidism). The cause of insomnia may not be known. RISK FACTORS Risk factors for insomnia include:  Gender. Women are more commonly affected than men.  Age. Insomnia is more common as you get older.  Stress. This may involve your professional or personal life.  Income. Insomnia is more common in people with lower income.  Lack of exercise.   Irregular work schedule or night shifts.  Traveling between different time zones. SIGNS AND SYMPTOMS If you have insomnia, trouble falling asleep or trouble staying asleep is the main symptom. This may lead to other symptoms, such as:  Feeling fatigued.  Feeling nervous about going to sleep.  Not feeling rested in the morning.  Having trouble concentrating.  Feeling irritable,  anxious, or depressed. TREATMENT  Treatment for insomnia depends on the cause. If your insomnia is caused by an underlying condition, treatment will focus on addressing the condition. Treatment may also include:   Medicines to help you sleep.  Counseling or therapy.  Lifestyle adjustments. HOME CARE INSTRUCTIONS   Take medicines only as directed by your health care provider.  Keep regular sleeping and waking hours. Avoid naps.  Keep a sleep diary to help you and your health care provider figure out what could be causing your insomnia. Include:   When you sleep.  When you wake up during the night.  How well you sleep.   How rested you feel the next day.  Any side effects of medicines you are taking.  What you eat and drink.   Make your bedroom a comfortable place where it is easy to fall asleep:  Put up shades or special blackout curtains to block light from outside.  Use a white noise machine to block noise.  Keep the temperature cool.   Exercise regularly as directed by your health care provider. Avoid exercising right before bedtime.  Use relaxation techniques to manage stress. Ask your health care provider to suggest some techniques that may work well for you. These may include:  Breathing exercises.  Routines to release muscle tension.  Visualizing peaceful scenes.  Cut back on alcohol, caffeinated beverages, and cigarettes, especially close to bedtime. These can disrupt your sleep.  Do not overeat or eat spicy foods right before bedtime. This can lead to digestive discomfort that can make it hard for you to sleep.  Limit screen use before bedtime. This includes:  Watching TV.  Using your smartphone, tablet, and computer.  Stick to a routine. This can help you fall asleep faster. Try to do a quiet activity, brush your teeth, and go to bed at the same time each night.  Get out of bed if you are still awake after 15 minutes of trying to sleep. Keep the  lights down, but try reading or doing a quiet activity. When you feel sleepy, go back to bed.  Make sure that you drive carefully. Avoid driving if you feel very sleepy.  Keep all follow-up appointments as directed by your health care provider. This is important. SEEK MEDICAL CARE IF:   You are tired throughout the day or have trouble in your daily routine due to sleepiness.  You continue to have sleep problems or your sleep problems get worse. SEEK IMMEDIATE MEDICAL CARE IF:   You have serious thoughts about hurting yourself or someone else.   This information is not intended to replace advice given to you by your health care provider. Make sure you discuss any questions you have with your health care provider.   Document Released: 01/17/2000 Document Revised: 10/10/2014 Document Reviewed: 10/20/2013 Elsevier Interactive Patient Education [employer].

## 2015-11-09 ENCOUNTER — Encounter: Payer: Self-pay | Admitting: Family Medicine

## 2015-11-09 DIAGNOSIS — N184 Chronic kidney disease, stage 4 (severe): Secondary | ICD-10-CM | POA: Insufficient documentation

## 2015-11-09 DIAGNOSIS — N186 End stage renal disease: Secondary | ICD-10-CM

## 2015-11-09 HISTORY — DX: End stage renal disease: N18.6

## 2015-11-09 NOTE — Progress Notes (Signed)
Patient ID: Jillian Hayes, female   DOB: 16-Aug-1945, 70 y.o.   MRN: [id number]   Subjective:    Patient ID: Jillian Hayes, female    DOB: Jan 25, 1946, 70 y.o.   MRN: [id number]  Chief Complaint  Patient presents with  . Follow-up    HPI Patient is in today for follow up. She is feeling well today. No recent illness or acute concerns. Declines flu shot today. No hospitalizations. Trying to stay active and maintain a heart healthy diet. Denies CP/palp/SOB/HA/congestion/fevers/GI or GU c/o. Taking meds as prescribed  Past Medical History:  Diagnosis Date  . Advanced care planning/counseling discussion 06/10/2014   05/31/2014 patient presents copy of HCP and Living Will  . Allergy    seasonal  . Anemia    iron deficiency  . Anxiety   . Benign fundic gland polyps of stomach   . Bladder polyps 06/25/2010  . Chronic headaches 06/24/2010  . Chronic renal insufficiency, stage 4 (severe) ([place]) 11/09/2015  . Depression 1991   hospitalized  . Fatigue 06/24/2010  . History of chicken pox 06/25/2010  . Hypercalcemia 02/18/2014  . Hypertension   . Insomnia 06/24/2010  . Medicare annual wellness visit, subsequent 11/09/2011  . Multiple chemical sensitivity syndrome 06/25/2010  . Overweight(278.02) 06/24/2010  . Proteinuria 02/18/2014  . Renal insufficiency 03/26/2011    Past Surgical History:  Procedure Laterality Date  . cyst on left breast removed Left 1991   benign  . NASAL SEPTUM SURGERY  1986   rhinoplasty  . polyps on bladder removed  1972   benign  . TONSILLECTOMY  1962    Family History  Problem Relation Age of Onset  . Breast cancer Mother 56    left breast removed, 2010 lung  . Nephrolithiasis Father   . Heart attack Father 68    X 3  . Heart disease Father     smoker  . Hypertension Brother   . Melanoma Son 37    melanoma on leg removed  . Pancreatic cancer Maternal Grandmother   . Hypertension Paternal Grandmother   . Obesity Paternal Grandmother   . Diabetes Mother     . Diabetes Maternal Aunt     x 2  . Diabetes Maternal Uncle     x 3  . Heart attack Paternal Grandfather 19    Social History   Social History  . Marital status: Married    Spouse name: N/A  . Number of children: 2  . Years of education: N/A   Occupational History  . retired    Social History Main Topics  . Smoking status: Never Smoker  . Smokeless tobacco: Never Used  . Alcohol use No  . Drug use: No  . Sexual activity: Yes    Partners: Male     Comment: lives with husband, wears with husband   Other Topics Concern  . Not on file   Social History Narrative   Lives with husband.      Outpatient Medications Prior to Visit  Medication Sig Dispense Refill  . ALFALFA PO Take 4,500 mg by mouth daily.      Marland Kitchen amLODipine (NORVASC) 5 MG tablet Take 1 tablet (5 mg total) by mouth 2 (two) times daily. 180 tablet 1  . b complex vitamins capsule Take 1 capsule by mouth daily.    . Calcium Carbonate-Vitamin D (CALCIUM + D PO) Take 1,000 mg by mouth daily.      . carvedilol (COREG) 3.125 MG tablet TAKE ONE  TABLET BY MOUTH TWICE DAILY 60 tablet 4  . Ferrous Fumarate (IRON) 18 MG TBCR Take 1 tablet by mouth daily.      . Flaxseed, Linseed, (FLAX SEED OIL PO) Take 1 capsule by mouth 3 (three) times daily.    . hydrALAZINE (APRESOLINE) 10 MG tablet TAKE ONE TABLET BY MOUTH THREE TIMES DAILY (Patient taking differently: Take 10 mg by mouth 2 (two) times daily. ) 180 tablet 1  . L-GLUTAMINE PO Take 1 capsule by mouth daily.    Marland Kitchen lisinopril (PRINIVIL,ZESTRIL) 5 MG tablet Take 1 tablet (5 mg total) by mouth 2 (two) times daily. 180 tablet 1  . Probiotic Product (PROBIOTIC DAILY PO) Take 1 tablet by mouth daily.    . vitamin C (ASCORBIC ACID) 500 MG tablet Take 500 mg by mouth daily as needed.     . cholecalciferol (VITAMIN D) 1000 UNITS tablet Take 1,000 Units by mouth daily.       No facility-administered medications prior to visit.     Allergies  Allergen Reactions  . Sulfa  Antibiotics Nausea And Vomiting    Review of Systems  Constitutional: Negative for fever and malaise/fatigue.  HENT: Negative for congestion.   Eyes: Negative for blurred vision.  Respiratory: Negative for shortness of breath.   Cardiovascular: Negative for chest pain, palpitations and leg swelling.  Gastrointestinal: Negative for abdominal pain, blood in stool and nausea.  Genitourinary: Negative for dysuria and frequency.  Musculoskeletal: Negative for falls.  Skin: Negative for rash.  Neurological: Negative for dizziness, loss of consciousness and headaches.  Endo/Heme/Allergies: Negative for environmental allergies.  Psychiatric/Behavioral: Negative for depression. The patient is not nervous/anxious.        Objective:    Physical Exam  Constitutional: She is oriented to person, place, and time. She appears well-developed and well-nourished. No distress.  HENT:  Head: Normocephalic and atraumatic.  Nose: Nose normal.  Eyes: Right eye exhibits no discharge. Left eye exhibits no discharge.  Neck: Normal range of motion. Neck supple.  Cardiovascular: Normal rate and regular rhythm.   No murmur heard. Pulmonary/Chest: Effort normal and breath sounds normal.  Abdominal: Soft. Bowel sounds are normal. There is no tenderness.  Musculoskeletal: She exhibits no edema.  Neurological: She is alert and oriented to person, place, and time.  Skin: Skin is warm and dry.  Psychiatric: She has a normal mood and affect.  Nursing note and vitals reviewed.   BP (!) 148/78 (BP Location: Left Arm, Patient Position: Sitting, Cuff Size: Normal)   Pulse 64   Temp 97.9 F (36.6 C) (Oral)   Ht 5\' 7"  (1.702 m)   Wt 154 lb 4 oz (70 kg)   SpO2 97%   BMI 24.16 kg/m  Wt Readings from Last 3 Encounters:  11/07/15 154 lb 4 oz (70 kg)  09/13/15 157 lb 2 oz (71.3 kg)  08/01/15 156 lb (70.8 kg)     Lab Results  Component Value Date   WBC 5.1 09/13/2015   HGB 10.9 (L) 09/13/2015   HCT 32.7  (L) 09/13/2015   PLT 242.0 09/13/2015   GLUCOSE 85 09/13/2015   CHOL 199 06/24/2015   TRIG 126.0 06/24/2015   HDL 78.30 06/24/2015   LDLCALC 95 06/24/2015   ALT 19 09/13/2015   AST 28 09/13/2015   NA 139 09/13/2015   K 4.9 09/13/2015   CL 106 09/13/2015   CREATININE 2.00 (H) 09/13/2015   BUN 43 (H) 09/13/2015   CO2 28 09/13/2015   TSH 0.55  08/01/2015    Lab Results  Component Value Date   TSH 0.55 08/01/2015   Lab Results  Component Value Date   WBC 5.1 09/13/2015   HGB 10.9 (L) 09/13/2015   HCT 32.7 (L) 09/13/2015   MCV 83.5 09/13/2015   PLT 242.0 09/13/2015   Lab Results  Component Value Date   NA 139 09/13/2015   K 4.9 09/13/2015   CO2 28 09/13/2015   GLUCOSE 85 09/13/2015   BUN 43 (H) 09/13/2015   CREATININE 2.00 (H) 09/13/2015   BILITOT 0.4 09/13/2015   ALKPHOS 53 09/13/2015   AST 28 09/13/2015   ALT 19 09/13/2015   PROT 6.8 09/13/2015   ALBUMIN 4.0 09/13/2015   CALCIUM 10.7 (H) 09/13/2015   ANIONGAP 13 01/16/2014   GFR 26.20 (L) 09/13/2015   Lab Results  Component Value Date   CHOL 199 06/24/2015   Lab Results  Component Value Date   HDL 78.30 06/24/2015   Lab Results  Component Value Date   LDLCALC 95 06/24/2015   Lab Results  Component Value Date   TRIG 126.0 06/24/2015   Lab Results  Component Value Date   CHOLHDL 3 06/24/2015   No results found for: HGBA1C     Assessment & Plan:   Problem List Items Addressed This Visit    Anemia - Primary    Increase leafy greens, consider increased lean red meat and using cast iron cookware. Continue to monitor, report any concerns. Check stool for blood.       Relevant Orders   CBC   TSH   T4, free   Lipid panel   Retic   Insomnia    Encouraged good sleep hygiene such as dark, quiet room. No blue/green glowing lights such as computer screens in bedroom. No alcohol or stimulants in evening. Cut down on caffeine as able. Regular exercise is helpful but not just prior to bed time.  Melatonin or L Tryptophan      Renal insufficiency    Following with nephrology, stable      Abnormal thyroid function test   Relevant Orders   CBC   TSH   T4, free   Lipid panel   HTN (hypertension)    Well controlled, no changes to meds. Encouraged heart healthy diet such as the DASH diet and exercise as tolerated.       Relevant Orders   CBC   TSH   T4, free   Lipid panel   Hypercalcemia    Has stopped all calcium and is being checked by nephrology      Relevant Orders   CBC   TSH   T4, free   Lipid panel   Chronic renal insufficiency, stage 4 (severe) ([place])    Stable and following with nephrology, maintain adequate hydration       Other Visit Diagnoses    Vitamin D deficiency       Relevant Orders   Vitamin D (25 hydroxy)      I have discontinued Ms. Hornbaker cholecalciferol. I am also having her maintain her Calcium Carbonate-Vitamin D (CALCIUM + D PO), ALFALFA PO, vitamin C, Iron, (Flaxseed, Linseed, (FLAX SEED OIL PO)), b complex vitamins, L-GLUTAMINE PO, Probiotic Product (PROBIOTIC DAILY PO), hydrALAZINE, lisinopril, carvedilol, and amLODipine.  No orders of the defined types were placed in this encounter.    [name], MD

## 2015-11-09 NOTE — Assessment & Plan Note (Signed)
Stable and following with nephrology, maintain adequate hydration

## 2015-11-09 NOTE — Assessment & Plan Note (Signed)
Increase leafy greens, consider increased lean red meat and using cast iron cookware. Continue to monitor, report any concerns. Check stool for blood.

## 2015-11-11 ENCOUNTER — Other Ambulatory Visit (INDEPENDENT_AMBULATORY_CARE_PROVIDER_SITE_OTHER): Payer: Medicare Other

## 2015-11-11 ENCOUNTER — Other Ambulatory Visit: Payer: Medicare Other

## 2015-11-11 DIAGNOSIS — E038 Other specified hypothyroidism: Secondary | ICD-10-CM

## 2015-11-11 LAB — LIPID PANEL
CHOL/HDL RATIO: 2
CHOLESTEROL: 190 mg/dL (ref 0–200)
CHOLESTEROL: 195 mg/dL (ref 0–200)
HDL: 83.2 mg/dL (ref >39.00)
HDL: 83.1 mg/dL (ref >39.00)
LDL CALC: 91 mg/dL (ref 0–99)
LDL Cholesterol: 87 mg/dL (ref 0–99)
NonHDL: 106.77
NonHDL: 111.65
TRIGLYCERIDES: 101 mg/dL (ref 0.0–149.0)
Total CHOL/HDL Ratio: 2
Triglycerides: 98 mg/dL (ref 0.0–149.0)
VLDL: 19.6 mg/dL (ref 0.0–40.0)
VLDL: 20.2 mg/dL (ref 0.0–40.0)

## 2015-11-11 LAB — CBC
HCT: 31.4 % — ABNORMAL LOW (ref 36.0–46.0)
Hemoglobin: 10.5 g/dL — ABNORMAL LOW (ref 12.0–15.0)
MCHC: 33.4 g/dL (ref 30.0–36.0)
MCV: 83.4 fl (ref 78.0–100.0)
PLATELETS: 269 10*3/uL (ref 150.0–400.0)
RBC: 3.76 Mil/uL — AB (ref 3.87–5.11)
RDW: 16.2 % — ABNORMAL HIGH (ref 11.5–15.5)
WBC: 5.9 10*3/uL (ref 4.0–10.5)

## 2015-11-11 LAB — RETICULOCYTES
ABS Retic: 45120 cells/uL (ref 20000–80000)
RBC.: 3.76 MIL/uL — ABNORMAL LOW (ref 3.80–5.10)
RETIC CT PCT: 1.2 %

## 2015-11-11 LAB — T4, FREE
FREE T4: 0.81 ng/dL (ref 0.60–1.60)
Free T4: 0.85 ng/dL (ref 0.60–1.60)

## 2015-11-11 LAB — TSH
TSH: 0.73 u[IU]/mL (ref 0.35–4.50)
TSH: 0.6 u[IU]/mL (ref 0.35–4.50)

## 2015-11-12 ENCOUNTER — Other Ambulatory Visit: Payer: Self-pay | Admitting: Family Medicine

## 2015-11-12 DIAGNOSIS — D649 Anemia, unspecified: Secondary | ICD-10-CM

## 2015-11-12 MED ORDER — FERROUS FUMARATE 324 (106 FE) MG PO TABS: 1.0000 | ORAL_TABLET | Freq: Every day | ORAL | 3 refills | Status: DC

## 2015-11-14 ENCOUNTER — Other Ambulatory Visit (INDEPENDENT_AMBULATORY_CARE_PROVIDER_SITE_OTHER): Payer: Medicare Other

## 2015-11-14 DIAGNOSIS — K625 Hemorrhage of anus and rectum: Secondary | ICD-10-CM

## 2015-11-14 LAB — FECAL OCCULT BLOOD, IMMUNOCHEMICAL: Fecal Occult Bld: NEGATIVE

## 2016-01-21 ENCOUNTER — Ambulatory Visit (INDEPENDENT_AMBULATORY_CARE_PROVIDER_SITE_OTHER): Payer: Medicare Other | Admitting: Family Medicine

## 2016-01-21 ENCOUNTER — Encounter: Payer: Self-pay | Admitting: Family Medicine

## 2016-01-21 VITALS — BP 130/74 | HR 71 | Temp 97.1°F | Wt 160.6 lb

## 2016-01-21 DIAGNOSIS — D509 Iron deficiency anemia, unspecified: Secondary | ICD-10-CM | POA: Diagnosis not present

## 2016-01-21 DIAGNOSIS — L578 Other skin changes due to chronic exposure to nonionizing radiation: Secondary | ICD-10-CM | POA: Diagnosis not present

## 2016-01-21 DIAGNOSIS — G4700 Insomnia, unspecified: Secondary | ICD-10-CM

## 2016-01-21 DIAGNOSIS — I10 Essential (primary) hypertension: Secondary | ICD-10-CM | POA: Diagnosis not present

## 2016-01-21 LAB — CBC
HEMATOCRIT: 31.2 % — AB (ref 36.0–46.0)
HEMOGLOBIN: 10.4 g/dL — AB (ref 12.0–15.0)
MCHC: 33.2 g/dL (ref 30.0–36.0)
MCV: 84.6 fl (ref 78.0–100.0)
Platelets: 258 10*3/uL (ref 150.0–400.0)
RBC: 3.68 Mil/uL — AB (ref 3.87–5.11)
RDW: 15.3 % (ref 11.5–15.5)
WBC: 6.8 10*3/uL (ref 4.0–10.5)

## 2016-01-21 LAB — COMPREHENSIVE METABOLIC PANEL
ALBUMIN: 3.9 g/dL (ref 3.5–5.2)
ALT: 17 U/L (ref 0–35)
AST: 26 U/L (ref 0–37)
Alkaline Phosphatase: 59 U/L (ref 39–117)
BUN: 52 mg/dL — ABNORMAL HIGH (ref 6–23)
CALCIUM: 10.3 mg/dL (ref 8.4–10.5)
CHLORIDE: 105 meq/L (ref 96–112)
CO2: 25 mEq/L (ref 19–32)
CREATININE: 2.03 mg/dL — AB (ref 0.40–1.20)
GFR: 25.72 mL/min — ABNORMAL LOW (ref >60.00)
Glucose, Bld: 140 mg/dL — ABNORMAL HIGH (ref 70–99)
Potassium: 5.2 mEq/L — ABNORMAL HIGH (ref 3.5–5.1)
Sodium: 137 mEq/L (ref 135–145)
Total Bilirubin: 0.3 mg/dL (ref 0.2–1.2)
Total Protein: 6.5 g/dL (ref 6.0–8.3)

## 2016-01-21 LAB — FERRITIN: FERRITIN: 132.6 ng/mL (ref 10.0–291.0)

## 2016-01-21 MED ORDER — CARVEDILOL 3.125 MG PO TABS: 3.1250 mg | ORAL_TABLET | Freq: Two times a day (BID) | ORAL | 1 refills | Status: DC

## 2016-01-21 NOTE — Progress Notes (Signed)
Patient ID: Jillian Hayes, female   DOB: 1946/01/17, 70 y.o.   MRN: [id number]

## 2016-01-21 NOTE — Assessment & Plan Note (Signed)
Well controlled, no changes to meds. Encouraged heart healthy diet such as the DASH diet and exercise as tolerated.  °

## 2016-01-21 NOTE — Patient Instructions (Signed)
Add Benefiber or Metamucil to help with constipation drink 64 oz of clear fluids daily  Anemia, Nonspecific Anemia is a condition in which the concentration of red blood cells or hemoglobin in the blood is below normal. Hemoglobin is a substance in red blood cells that carries oxygen to the tissues of the body. Anemia results in not enough oxygen reaching these tissues. What are the causes? Common causes of anemia include:  Excessive bleeding. Bleeding may be internal or external. This includes excessive bleeding from periods (in women) or from the intestine.  Poor nutrition.  Chronic kidney, thyroid, and liver disease.  Bone marrow disorders that decrease red blood cell production.  Cancer and treatments for cancer.  HIV, AIDS, and their treatments.  Spleen problems that increase red blood cell destruction.  Blood disorders.  Excess destruction of red blood cells due to infection, medicines, and autoimmune disorders. What are the signs or symptoms?  Minor weakness.  Dizziness.  Headache.  Palpitations.  Shortness of breath, especially with exercise.  Paleness.  Cold sensitivity.  Indigestion.  Nausea.  Difficulty sleeping.  Difficulty concentrating. Symptoms may occur suddenly or they may develop slowly. How is this diagnosed? Additional blood tests are often needed. These help your health care provider determine the best treatment. Your health care provider will check your stool for blood and look for other causes of blood loss. How is this treated? Treatment varies depending on the cause of the anemia. Treatment can include:  Supplements of iron, vitamin M22, or folic acid.  Hormone medicines.  A blood transfusion. This may be needed if blood loss is severe.  Hospitalization. This may be needed if there is significant continual blood loss.  Dietary changes.  Spleen removal. Follow these instructions at home: Keep all follow-up appointments. It often  takes many weeks to correct anemia, and having your health care provider check on your condition and your response to treatment is very important. Get help right away if:  You develop extreme weakness, shortness of breath, or chest pain.  You become dizzy or have trouble concentrating.  You develop heavy vaginal bleeding.  You develop a rash.  You have bloody or black, tarry stools.  You faint.  You vomit up blood.  You vomit repeatedly.  You have abdominal pain.  You have a fever or persistent symptoms for more than 2-3 days.  You have a fever and your symptoms suddenly get worse.  You are dehydrated. This information is not intended to replace advice given to you by your health care provider. Make sure you discuss any questions you have with your health care provider. Document Released: 02/27/2004 Document Revised: 07/03/2015 Document Reviewed: 07/15/2012 Elsevier Interactive Patient Education  2017 [employer].

## 2016-01-21 NOTE — Progress Notes (Signed)
Subjective:    Patient ID: Jillian Hayes, female    DOB: Feb 23, 1945, 70 y.o.   MRN: [id number]  Chief Complaint  Patient presents with  . Follow-up    Anemia. Would like to discuss current iron medication.    HPI Patient is in today for a follow up anemia. No additional acute concerns. Is requesting referral to dermatology for surveillance, no significant lesions today but has intermittent dermatitis and dry skin. No recent illness or hospitalizations. Denies CP/palp/SOB/HA/congestion/fevers/GI or GU c/o. Taking meds as prescribed  Past Medical History:  Diagnosis Date  . Advanced care planning/counseling discussion 06/10/2014   05/31/2014 patient presents copy of HCP and Living Will  . Allergy    seasonal  . Anemia    iron deficiency  . Anxiety   . Benign fundic gland polyps of stomach   . Bladder polyps 06/25/2010  . Chronic headaches 06/24/2010  . Chronic renal insufficiency, stage 4 (severe) ([place]) 11/09/2015  . Depression 1991   hospitalized  . Fatigue 06/24/2010  . History of chicken pox 06/25/2010  . Hypercalcemia 02/18/2014  . Hypertension   . Insomnia 06/24/2010  . Medicare annual wellness visit, subsequent 11/09/2011  . Multiple chemical sensitivity syndrome 06/25/2010  . Overweight(278.02) 06/24/2010  . Proteinuria 02/18/2014  . Renal insufficiency 03/26/2011  . Sun-damaged skin 01/21/2016    Past Surgical History:  Procedure Laterality Date  . cyst on left breast removed Left 1991   benign  . NASAL SEPTUM SURGERY  1986   rhinoplasty  . polyps on bladder removed  1972   benign  . TONSILLECTOMY  1962    Family History  Problem Relation Age of Onset  . Breast cancer Mother 74    left breast removed, 2010 lung  . Nephrolithiasis Father   . Heart attack Father 45    X 3  . Heart disease Father     smoker  . Hypertension Brother   . Melanoma Son 37    melanoma on leg removed  . Pancreatic cancer Maternal Grandmother   . Hypertension Paternal Grandmother   .  Obesity Paternal Grandmother   . Diabetes Mother   . Diabetes Maternal Aunt     x 2  . Diabetes Maternal Uncle     x 3  . Heart attack Paternal Grandfather 5    Social History   Social History  . Marital status: Married    Spouse name: N/A  . Number of children: 2  . Years of education: N/A   Occupational History  . retired    Social History Main Topics  . Smoking status: Never Smoker  . Smokeless tobacco: Never Used  . Alcohol use No  . Drug use: No  . Sexual activity: Yes    Partners: Male     Comment: lives with husband, wears with husband   Other Topics Concern  . Not on file   Social History Narrative   Lives with husband.      Outpatient Medications Prior to Visit  Medication Sig Dispense Refill  . ALFALFA PO Take 4,500 mg by mouth daily.      Marland Kitchen amLODipine (NORVASC) 5 MG tablet Take 1 tablet (5 mg total) by mouth 2 (two) times daily. 180 tablet 1  . b complex vitamins capsule Take 1 capsule by mouth daily.    . Calcium Carbonate-Vitamin D (CALCIUM + D PO) Take 1,000 mg by mouth daily.      . Ferrous Fumarate (HEMOCYTE) 324 (106 Fe) MG  TABS tablet Take 1 tablet (106 mg of iron total) by mouth daily. 30 tablet 3  . Flaxseed, Linseed, (FLAX SEED OIL PO) Take 1 capsule by mouth 3 (three) times daily.    . hydrALAZINE (APRESOLINE) 10 MG tablet TAKE ONE TABLET BY MOUTH THREE TIMES DAILY (Patient taking differently: Take 10 mg by mouth 2 (two) times daily. ) 180 tablet 1  . L-GLUTAMINE PO Take 1 capsule by mouth daily.    . Probiotic Product (PROBIOTIC DAILY PO) Take 1 tablet by mouth daily.    . vitamin C (ASCORBIC ACID) 500 MG tablet Take 500 mg by mouth daily as needed.     . carvedilol (COREG) 3.125 MG tablet TAKE ONE TABLET BY MOUTH TWICE DAILY 60 tablet 4  . lisinopril (PRINIVIL,ZESTRIL) 5 MG tablet Take 1 tablet (5 mg total) by mouth 2 (two) times daily. 180 tablet 1   No facility-administered medications prior to visit.     Allergies  Allergen Reactions    . Sulfa Antibiotics Nausea And Vomiting    Review of Systems  Constitutional: Positive for malaise/fatigue. Negative for fever.  Eyes: Negative for blurred vision.  Respiratory: Negative for cough and shortness of breath.   Cardiovascular: Negative for chest pain.  Gastrointestinal: Negative for vomiting.  Musculoskeletal: Negative for back pain.  Skin: Positive for rash.  Neurological: Negative for loss of consciousness and headaches.       Objective:    Physical Exam  Constitutional: She is oriented to person, place, and time. She appears well-developed and well-nourished. No distress.  HENT:  Head: Normocephalic and atraumatic.  Eyes: Conjunctivae are normal.  Neck: Normal range of motion. No thyromegaly present.  Cardiovascular: Normal rate and regular rhythm.   Pulmonary/Chest: Effort normal and breath sounds normal. She has no wheezes.  Abdominal: Soft. Bowel sounds are normal. There is no tenderness.  Musculoskeletal: She exhibits no edema or deformity.  Neurological: She is alert and oriented to person, place, and time.  Skin: Skin is warm and dry. She is not diaphoretic.  Psychiatric: She has a normal mood and affect.    BP 130/74 (BP Location: Left Arm, Patient Position: Sitting, Cuff Size: Normal)   Pulse 71   Temp 97.1 F (36.2 C) (Oral)   Wt 160 lb 9.6 oz (72.8 kg)   SpO2 97% Comment: RA  BMI 25.15 kg/m  Wt Readings from Last 3 Encounters:  01/21/16 160 lb 9.6 oz (72.8 kg)  11/07/15 154 lb 4 oz (70 kg)  09/13/15 157 lb 2 oz (71.3 kg)     Lab Results  Component Value Date   WBC 6.8 01/21/2016   HGB 10.4 (L) 01/21/2016   HCT 31.2 (L) 01/21/2016   PLT 258.0 01/21/2016   GLUCOSE 140 (H) 01/21/2016   CHOL 195 11/11/2015   TRIG 101.0 11/11/2015   HDL 83.10 11/11/2015   LDLCALC 91 11/11/2015   ALT 17 01/21/2016   AST 26 01/21/2016   NA 137 01/21/2016   K 5.2 (H) 01/21/2016   CL 105 01/21/2016   CREATININE 2.03 (H) 01/21/2016   BUN 52 (H)  01/21/2016   CO2 25 01/21/2016   TSH 0.60 11/11/2015    Lab Results  Component Value Date   TSH 0.60 11/11/2015   Lab Results  Component Value Date   WBC 6.8 01/21/2016   HGB 10.4 (L) 01/21/2016   HCT 31.2 (L) 01/21/2016   MCV 84.6 01/21/2016   PLT 258.0 01/21/2016   Lab Results  Component Value Date  NA 137 01/21/2016   K 5.2 (H) 01/21/2016   CO2 25 01/21/2016   GLUCOSE 140 (H) 01/21/2016   BUN 52 (H) 01/21/2016   CREATININE 2.03 (H) 01/21/2016   BILITOT 0.3 01/21/2016   ALKPHOS 59 01/21/2016   AST 26 01/21/2016   ALT 17 01/21/2016   PROT 6.5 01/21/2016   ALBUMIN 3.9 01/21/2016   CALCIUM 10.3 01/21/2016   ANIONGAP 13 01/16/2014   GFR 25.72 (L) 01/21/2016   Lab Results  Component Value Date   CHOL 195 11/11/2015   Lab Results  Component Value Date   HDL 83.10 11/11/2015   Lab Results  Component Value Date   LDLCALC 91 11/11/2015   Lab Results  Component Value Date   TRIG 101.0 11/11/2015   Lab Results  Component Value Date   CHOLHDL 2 11/11/2015   No results found for: HGBA1C    I acted as a Education administrator for Dr. Charlett Blake. Raiford Noble, CMA  Assessment & Plan:   Problem List Items Addressed This Visit    Anemia - Primary    Increase leafy greens, consider increased lean red meat and using cast iron cookware. Continue to monitor, report any concerns. Taking hemocyte f daily      Relevant Orders   Ambulatory referral to Dermatology   Comprehensive metabolic panel (Completed)   CBC (Completed)   Ferritin (Completed)   Insomnia    Encouraged good sleep hygiene such as dark, quiet room. No blue/green glowing lights such as computer screens in bedroom. No alcohol or stimulants in evening. Cut down on caffeine as able. Regular exercise is helpful but not just prior to bed time.       HTN (hypertension)    Well controlled, no changes to meds. Encouraged heart healthy diet such as the DASH diet and exercise as tolerated.       Relevant Medications    carvedilol (COREG) 3.125 MG tablet   Sun-damaged skin    Referred to dermatology for ongoing care.          I have changed Ms. Rocca carvedilol. I am also having her maintain her Calcium Carbonate-Vitamin D (CALCIUM + D PO), ALFALFA PO, vitamin C, (Flaxseed, Linseed, (FLAX SEED OIL PO)), b complex vitamins, L-GLUTAMINE PO, Probiotic Product (PROBIOTIC DAILY PO), hydrALAZINE, amLODipine, and Ferrous Fumarate.  Meds ordered this encounter  Medications  . carvedilol (COREG) 3.125 MG tablet    Sig: Take 1 tablet (3.125 mg total) by mouth 2 (two) times daily.    Dispense:  180 tablet    Refill:  1    CMA served as scribe during this visit. History, Physical and Plan performed by medical provider. Documentation and orders reviewed and attested to.  [name], MD

## 2016-01-21 NOTE — Assessment & Plan Note (Signed)
Increase leafy greens, consider increased lean red meat and using cast iron cookware. Continue to monitor, report any concerns. Taking hemocyte f daily

## 2016-01-21 NOTE — Progress Notes (Signed)
Pre visit review using our clinic review tool, if applicable. No additional management support is needed unless otherwise documented below in the visit note. 

## 2016-01-22 ENCOUNTER — Other Ambulatory Visit: Payer: Self-pay | Admitting: Family Medicine

## 2016-01-22 DIAGNOSIS — D649 Anemia, unspecified: Secondary | ICD-10-CM

## 2016-02-05 ENCOUNTER — Other Ambulatory Visit: Payer: Self-pay | Admitting: Family Medicine

## 2016-02-05 NOTE — Assessment & Plan Note (Signed)
Referred to dermatology for ongoing care.

## 2016-02-05 NOTE — Assessment & Plan Note (Signed)
Encouraged good sleep hygiene such as dark, quiet room. No blue/green glowing lights such as computer screens in bedroom. No alcohol or stimulants in evening. Cut down on caffeine as able. Regular exercise is helpful but not just prior to bed time.  

## 2016-02-12 ENCOUNTER — Telehealth: Payer: Self-pay | Admitting: Family Medicine

## 2016-02-12 NOTE — Telephone Encounter (Signed)
Pt says that she received a call from Hematology for an appt. Pt says that she's not sure why the referral is needed. She would like to be advised.   CB: [phone]

## 2016-02-12 NOTE — Telephone Encounter (Signed)
Please advise. Last lab results-anemia stable.

## 2016-02-12 NOTE — Telephone Encounter (Signed)
She has persistent anemia that is progressively worsening. Referral made to hematology to further evaluate. Since numbers worsening and patient symptomatic, made the decision to refer but she can certainly choose not to accept referra.

## 2016-03-02 ENCOUNTER — Other Ambulatory Visit: Payer: Self-pay | Admitting: Family

## 2016-03-02 ENCOUNTER — Ambulatory Visit: Payer: Medicare HMO

## 2016-03-02 ENCOUNTER — Ambulatory Visit (HOSPITAL_BASED_OUTPATIENT_CLINIC_OR_DEPARTMENT_OTHER): Payer: Medicare HMO | Admitting: Family

## 2016-03-02 ENCOUNTER — Other Ambulatory Visit (HOSPITAL_BASED_OUTPATIENT_CLINIC_OR_DEPARTMENT_OTHER): Payer: Medicare HMO

## 2016-03-02 DIAGNOSIS — D485 Neoplasm of uncertain behavior of skin: Secondary | ICD-10-CM | POA: Diagnosis not present

## 2016-03-02 DIAGNOSIS — L821 Other seborrheic keratosis: Secondary | ICD-10-CM | POA: Diagnosis not present

## 2016-03-02 DIAGNOSIS — D631 Anemia in chronic kidney disease: Secondary | ICD-10-CM

## 2016-03-02 DIAGNOSIS — N184 Chronic kidney disease, stage 4 (severe): Secondary | ICD-10-CM | POA: Diagnosis not present

## 2016-03-02 DIAGNOSIS — D649 Anemia, unspecified: Secondary | ICD-10-CM

## 2016-03-02 DIAGNOSIS — D1721 Benign lipomatous neoplasm of skin and subcutaneous tissue of right arm: Secondary | ICD-10-CM | POA: Diagnosis not present

## 2016-03-02 DIAGNOSIS — L570 Actinic keratosis: Secondary | ICD-10-CM | POA: Diagnosis not present

## 2016-03-02 DIAGNOSIS — L819 Disorder of pigmentation, unspecified: Secondary | ICD-10-CM | POA: Diagnosis not present

## 2016-03-02 DIAGNOSIS — D2261 Melanocytic nevi of right upper limb, including shoulder: Secondary | ICD-10-CM | POA: Diagnosis not present

## 2016-03-02 DIAGNOSIS — D1801 Hemangioma of skin and subcutaneous tissue: Secondary | ICD-10-CM | POA: Diagnosis not present

## 2016-03-02 LAB — COMPREHENSIVE METABOLIC PANEL
ALT: 28 U/L (ref 0–55)
AST: 37 U/L — AB (ref 5–34)
Albumin: 3.6 g/dL (ref 3.5–5.0)
Alkaline Phosphatase: 68 U/L (ref 40–150)
Anion Gap: 9 mEq/L (ref 3–11)
BUN: 33.5 mg/dL — AB (ref 7.0–26.0)
CALCIUM: 10.7 mg/dL — AB (ref 8.4–10.4)
CHLORIDE: 108 meq/L (ref 98–109)
CO2: 24 meq/L (ref 22–29)
CREATININE: 1.9 mg/dL — AB (ref 0.6–1.1)
EGFR: 26 mL/min/{1.73_m2} — ABNORMAL LOW (ref >90)
Glucose: 127 mg/dl (ref 70–140)
Potassium: 5 mEq/L (ref 3.5–5.1)
Sodium: 140 mEq/L (ref 136–145)
TOTAL PROTEIN: 6.8 g/dL (ref 6.4–8.3)
Total Bilirubin: 0.3 mg/dL (ref 0.20–1.20)

## 2016-03-02 LAB — CBC WITH DIFFERENTIAL (CANCER CENTER ONLY)
BASO#: 0 10*3/uL (ref 0.0–0.2)
BASO%: 0.7 % (ref 0.0–2.0)
EOS%: 3.7 % (ref 0.0–7.0)
Eosinophils Absolute: 0.2 10*3/uL (ref 0.0–0.5)
HEMATOCRIT: 35 % (ref 34.8–46.6)
HGB: 11.2 g/dL — ABNORMAL LOW (ref 11.6–15.9)
LYMPH#: 1.3 10*3/uL (ref 0.9–3.3)
LYMPH%: 21.4 % (ref 14.0–48.0)
MCH: 28.6 pg (ref 26.0–34.0)
MCHC: 32 g/dL (ref 32.0–36.0)
MCV: 89 fL (ref 81–101)
MONO#: 0.5 10*3/uL (ref 0.1–0.9)
MONO%: 7.8 % (ref 0.0–13.0)
NEUT#: 3.9 10*3/uL (ref 1.5–6.5)
NEUT%: 66.4 % (ref 39.6–80.0)
PLATELETS: 261 10*3/uL (ref 145–400)
RBC: 3.92 10*6/uL (ref 3.70–5.32)
RDW: 14.7 % (ref 11.1–15.7)
WBC: 5.9 10*3/uL (ref 3.9–10.0)

## 2016-03-02 LAB — CHCC SATELLITE - SMEAR

## 2016-03-02 NOTE — Progress Notes (Signed)
Hematology/Oncology Consultation   Name: Jillian Hayes      MRN: [id number]    Location: Room/bed info not found  Date: 03/02/2016 Time:3:24 PM   REFERRING PHYSICIAN:  Stacey A. Charlett Blake, MD  REASON FOR CONSULT: Iron deficiency anemia   DIAGNOSIS: 1. Anemia of chronic renal insufficiency - stage 4  HISTORY OF PRESENT ILLNESS: Jillian Hayes is very pleasant 71 yo white female with history of mild anemia. She states that her mother also had anemia. She is symptomatic at this time with fatigue and weakness.  She is currently taking an oral iron supplement. Iron studies are pending.  She has an internal hemorrhoid bleeds occasionally if she strains. Her colonoscopy in 2014 revealed diverticulosis of the sigmoid colon. She had 1 polyp removed which was benign.  She has stage 4 chronic renal insufficiency.  No other episodes of bleeding or bruising.  No fever, chills, n/v, cough, rash, dizziness, SOB, chest pain, palpitations, abdominal pain or changes in bowel or bladder habits.  No swelling or tenderness in her extremities. No new aches or pains. She has occasional numbness and tingling in her hands due to arthritis. She takes alfalfa for this which she states helps tremendously.  She is quite active and enjoys walking in her neighborhood and on a treadmill.  She has maintained a good appetite and is staying well hydrated. Her weight is stable. No significant loss or gain.  No personal cancer history. She recently had a spot removed from her left cheek which was benign. She sees dermatology annually.  No cycle, has gone through menopause. Still has all her female organs.  Family oncology history includes mother - breast, 2 maternal aunts - breast, 1 maternal aunt - ovarian, maternal grandmother - pancreatic and son - melanoma.  Her mammogram in August 2017 was negative.  She also states that after being exposed to the pesticide Durs[place] she developed multiple chemical sensitivity. Smells and other  triggers with cause her to have headaches and rashes.  She also states that she has "leaky gut" syndrome due to this.  She is a retired Surveyor, minerals.  Not a smoker and does not drink alcoholic beverages.   ROS: All other 10 point review of systems is negative.   PAST MEDICAL HISTORY:   Past Medical History:  Diagnosis Date  . Advanced care planning/counseling discussion 06/10/2014   05/31/2014 patient presents copy of HCP and Living Will  . Allergy    seasonal  . Anemia    iron deficiency  . Anxiety   . Benign fundic gland polyps of stomach   . Bladder polyps 06/25/2010  . Chronic headaches 06/24/2010  . Chronic renal insufficiency, stage 4 (severe) ([place]) 11/09/2015  . Depression 1991   hospitalized  . Fatigue 06/24/2010  . History of chicken pox 06/25/2010  . Hypercalcemia 02/18/2014  . Hypertension   . Insomnia 06/24/2010  . Medicare annual wellness visit, subsequent 11/09/2011  . Multiple chemical sensitivity syndrome 06/25/2010  . Overweight(278.02) 06/24/2010  . Proteinuria 02/18/2014  . Renal insufficiency 03/26/2011  . Sun-damaged skin 01/21/2016    ALLERGIES: Allergies  Allergen Reactions  . Sulfa Antibiotics Nausea And Vomiting      MEDICATIONS:  Current Outpatient Prescriptions on File Prior to Visit  Medication Sig Dispense Refill  . ALFALFA PO Take 4,500 mg by mouth daily.      Marland Kitchen amLODipine (NORVASC) 5 MG tablet Take 1 tablet (5 mg total) by mouth 2 (two) times daily. 180 tablet 1  . b complex  vitamins capsule Take 1 capsule by mouth daily.    . Calcium Carbonate-Vitamin D (CALCIUM + D PO) Take 1,000 mg by mouth daily.      . carvedilol (COREG) 3.125 MG tablet Take 1 tablet (3.125 mg total) by mouth 2 (two) times daily. 180 tablet 1  . Ferrous Fumarate (HEMOCYTE) 324 (106 Fe) MG TABS tablet Take 1 tablet (106 mg of iron total) by mouth daily. 30 tablet 3  . Flaxseed, Linseed, (FLAX SEED OIL PO) Take 1 capsule by mouth 3 (three) times daily.    . hydrALAZINE (APRESOLINE) 10  MG tablet TAKE ONE TABLET BY MOUTH THREE TIMES DAILY (Patient taking differently: Take 10 mg by mouth 2 (two) times daily. ) 180 tablet 1  . L-GLUTAMINE PO Take 1 capsule by mouth daily.    Marland Kitchen lisinopril (PRINIVIL,ZESTRIL) 5 MG tablet TAKE ONE TABLET BY MOUTH TWICE DAILY 180 tablet 1  . Probiotic Product (PROBIOTIC DAILY PO) Take 1 tablet by mouth daily.    . vitamin C (ASCORBIC ACID) 500 MG tablet Take 500 mg by mouth daily as needed.      No current facility-administered medications on file prior to visit.      PAST SURGICAL HISTORY Past Surgical History:  Procedure Laterality Date  . cyst on left breast removed Left 1991   benign  . NASAL SEPTUM SURGERY  1986   rhinoplasty  . polyps on bladder removed  1972   benign  . TONSILLECTOMY  1962    FAMILY HISTORY: Family History  Problem Relation Age of Onset  . Breast cancer Mother 72    left breast removed, 2010 lung  . Nephrolithiasis Father   . Heart attack Father 60    X 3  . Heart disease Father     smoker  . Hypertension Brother   . Melanoma Son 37    melanoma on leg removed  . Pancreatic cancer Maternal Grandmother   . Hypertension Paternal Grandmother   . Obesity Paternal Grandmother   . Diabetes Mother   . Diabetes Maternal Aunt     x 2  . Diabetes Maternal Uncle     x 3  . Heart attack Paternal Grandfather 49    SOCIAL HISTORY:  reports that she has never smoked. She has never used smokeless tobacco. She reports that she does not drink alcohol or use drugs.  PERFORMANCE STATUS: The patient's performance status is 1 - Symptomatic but completely ambulatory  PHYSICAL EXAM: Most Recent Vital Signs: Blood pressure (!) 166/61, pulse 71, temperature 98.1 F (36.7 C), temperature source Oral, resp. rate 16, weight 162 lb (73.5 kg), SpO2 99 %. BP (!) 166/61 (BP Location: Left Arm, Patient Position: Sitting)   Pulse 71   Temp 98.1 F (36.7 C) (Oral)   Resp 16   Wt 162 lb (73.5 kg)   SpO2 99%   BMI 25.37 kg/m    General Appearance:    Alert, cooperative, no distress, appears stated age  Head:    Normocephalic, without obvious abnormality, atraumatic  Eyes:    PERRL, conjunctiva/corneas clear, EOM's intact, fundi    benign, both eyes        Throat:   Lips, mucosa, and tongue normal; teeth and gums normal  Neck:   Supple, symmetrical, trachea midline, no adenopathy;    thyroid:  no enlargement/tenderness/nodules; no carotid   bruit or JVD  Back:     Symmetric, no curvature, ROM normal, no CVA tenderness  Lungs:  Clear to auscultation bilaterally, respirations unlabored  Chest Wall:    No tenderness or deformity   Heart:    Regular rate and rhythm, S1 and S2 normal, no murmur, rub   or gallop     Abdomen:     Soft, non-tender, bowel sounds active all four quadrants,    no masses, no organomegaly        Extremities:   Extremities normal, atraumatic, no cyanosis or edema  Pulses:   2+ and symmetric all extremities  Skin:   Skin color, texture, turgor normal, no rashes or lesions  Lymph nodes:   Cervical, supraclavicular, and axillary nodes normal  Neurologic:   CNII-XII intact, normal strength, sensation and reflexes    throughout   LABORATORY DATA:  Results for orders placed or performed in visit on 03/02/16 (from the past 48 hour(s))  CBC w/Diff     Status: Abnormal   Collection Time: 03/02/16  2:42 PM  Result Value Ref Range   WBC 5.9 3.9 - 10.0 10e3/uL   RBC 3.92 3.70 - 5.32 10e6/uL   HGB 11.2 (L) 11.6 - 15.9 g/dL   HCT 35.0 34.8 - 46.6 %   MCV 89 81 - 101 fL   MCH 28.6 26.0 - 34.0 pg   MCHC 32.0 32.0 - 36.0 g/dL   RDW 14.7 11.1 - 15.7 %   Platelets 261 145 - 400 10e3/uL   NEUT# 3.9 1.5 - 6.5 10e3/uL   LYMPH# 1.3 0.9 - 3.3 10e3/uL   MONO# 0.5 0.1 - 0.9 10e3/uL   Eosinophils Absolute 0.2 0.0 - 0.5 10e3/uL   BASO# 0.0 0.0 - 0.2 10e3/uL   NEUT% 66.4 39.6 - 80.0 %   LYMPH% 21.4 14.0 - 48.0 %   MONO% 7.8 0.0 - 13.0 %   EOS% 3.7 0.0 - 7.0 %   BASO% 0.7 0.0 - 2.0 %  Smear      Status: None   Collection Time: 03/02/16  2:42 PM  Result Value Ref Range   Smear Result Smear Available       RADIOGRAPHY: No results found.     PATHOLOGY: None  ASSESSMENT/PLAN: Ms. Pat is very pleasant 71 yo white female with history of mild anemia. Her iron studies were ok but erythropoietin level was low. With her history of chronic kidney disease stage 4 this appears to be contributing to her anemia.  We discussed the benefits and risks of Aranesp and she is willing to give this a try.  We will work on the prior authorization and hopefully be able to schedule her for injection later this week.  We will then plan to see her back in 3 weeks for repeat lab work, follow-up and injection if needed.  All questions were answered. She will contact our office with any questions or concerns. We can certainly see her sooner if necessary.  She was discussed with and also seen by Dr. Marin Olp and he is in agreement with the aforementioned.   [facility]     Addendum:  I saw and examined the patient with Georgette Helmer. Her erythropoietin level is only 5.6. This clearly explains her anemia. Her iron studies look okay. Her ferritin is 141 with an iron saturation of 27%.  She definitely needs Aranesp. However, her hemoglobin is too high for [place] to give it to her right now. We just have to follow her along. Once her hemoglobin gets below 11, then we can go ahead and give her an injection.  I looked  at her blood under the microscope. I do not see anything that looked like myelodysplasia or any type of bone marrow disorder.  We spent about 40 minutes with her. We answered all of her questions. She is very nice.  We will plan to get her back in another 3 weeks or so.  Lattie Haw, MD

## 2016-03-03 DIAGNOSIS — D631 Anemia in chronic kidney disease: Secondary | ICD-10-CM | POA: Insufficient documentation

## 2016-03-03 DIAGNOSIS — N184 Chronic kidney disease, stage 4 (severe): Principal | ICD-10-CM

## 2016-03-03 LAB — IRON AND TIBC
%SAT: 27 % (ref 21–57)
IRON: 76 ug/dL (ref 41–142)
TIBC: 284 ug/dL (ref 236–444)
UIBC: 208 ug/dL (ref 120–384)

## 2016-03-03 LAB — ERYTHROPOIETIN: Erythropoietin: 5.6 m[IU]/mL (ref 2.6–18.5)

## 2016-03-03 LAB — RETICULOCYTES: Reticulocyte Count: 1 % (ref 0.6–2.6)

## 2016-03-03 LAB — LACTATE DEHYDROGENASE: LDH: 203 U/L (ref 125–245)

## 2016-03-03 LAB — FERRITIN: FERRITIN: 141 ng/mL (ref 9–269)

## 2016-03-10 ENCOUNTER — Ambulatory Visit: Payer: Medicare HMO

## 2016-03-24 ENCOUNTER — Other Ambulatory Visit: Payer: Self-pay | Admitting: *Deleted

## 2016-03-24 DIAGNOSIS — H52223 Regular astigmatism, bilateral: Secondary | ICD-10-CM | POA: Diagnosis not present

## 2016-03-24 DIAGNOSIS — H40053 Ocular hypertension, bilateral: Secondary | ICD-10-CM | POA: Diagnosis not present

## 2016-03-24 DIAGNOSIS — H5203 Hypermetropia, bilateral: Secondary | ICD-10-CM | POA: Diagnosis not present

## 2016-03-24 DIAGNOSIS — H524 Presbyopia: Secondary | ICD-10-CM | POA: Diagnosis not present

## 2016-03-24 LAB — HM DIABETES EYE EXAM

## 2016-03-25 ENCOUNTER — Ambulatory Visit: Payer: Medicare HMO

## 2016-03-25 ENCOUNTER — Ambulatory Visit (HOSPITAL_BASED_OUTPATIENT_CLINIC_OR_DEPARTMENT_OTHER): Payer: Medicare HMO | Admitting: Family

## 2016-03-25 ENCOUNTER — Other Ambulatory Visit (HOSPITAL_BASED_OUTPATIENT_CLINIC_OR_DEPARTMENT_OTHER): Payer: Medicare HMO

## 2016-03-25 ENCOUNTER — Telehealth: Payer: Self-pay | Admitting: *Deleted

## 2016-03-25 ENCOUNTER — Other Ambulatory Visit: Payer: Self-pay | Admitting: Family

## 2016-03-25 VITALS — BP 139/55 | HR 63 | Temp 97.6°F | Resp 20 | Wt 159.0 lb

## 2016-03-25 DIAGNOSIS — N184 Chronic kidney disease, stage 4 (severe): Secondary | ICD-10-CM | POA: Diagnosis not present

## 2016-03-25 DIAGNOSIS — D631 Anemia in chronic kidney disease: Secondary | ICD-10-CM

## 2016-03-25 DIAGNOSIS — I10 Essential (primary) hypertension: Secondary | ICD-10-CM | POA: Diagnosis not present

## 2016-03-25 LAB — COMPREHENSIVE METABOLIC PANEL
ALBUMIN: 3.5 g/dL (ref 3.5–5.0)
ALK PHOS: 73 U/L (ref 40–150)
ALT: 19 U/L (ref 0–55)
AST: 26 U/L (ref 5–34)
Anion Gap: 8 mEq/L (ref 3–11)
BUN: 51.8 mg/dL — AB (ref 7.0–26.0)
CALCIUM: 10.5 mg/dL — AB (ref 8.4–10.4)
CHLORIDE: 109 meq/L (ref 98–109)
CO2: 23 mEq/L (ref 22–29)
CREATININE: 2.4 mg/dL — AB (ref 0.6–1.1)
EGFR: 20 mL/min/{1.73_m2} — ABNORMAL LOW (ref >90)
GLUCOSE: 88 mg/dL (ref 70–140)
Potassium: 5.9 mEq/L — ABNORMAL HIGH (ref 3.5–5.1)
SODIUM: 140 meq/L (ref 136–145)
Total Bilirubin: 0.35 mg/dL (ref 0.20–1.20)
Total Protein: 6.5 g/dL (ref 6.4–8.3)

## 2016-03-25 LAB — CBC WITH DIFFERENTIAL (CANCER CENTER ONLY)
BASO#: 0 10*3/uL (ref 0.0–0.2)
BASO%: 0.7 % (ref 0.0–2.0)
EOS%: 4.3 % (ref 0.0–7.0)
Eosinophils Absolute: 0.3 10*3/uL (ref 0.0–0.5)
HEMATOCRIT: 33.5 % — AB (ref 34.8–46.6)
HEMOGLOBIN: 10.8 g/dL — AB (ref 11.6–15.9)
LYMPH#: 1.7 10*3/uL (ref 0.9–3.3)
LYMPH%: 28.4 % (ref 14.0–48.0)
MCH: 28.8 pg (ref 26.0–34.0)
MCHC: 32.2 g/dL (ref 32.0–36.0)
MCV: 89 fL (ref 81–101)
MONO#: 0.7 10*3/uL (ref 0.1–0.9)
MONO%: 10.7 % (ref 0.0–13.0)
NEUT%: 55.9 % (ref 39.6–80.0)
NEUTROS ABS: 3.4 10*3/uL (ref 1.5–6.5)
Platelets: 254 10*3/uL (ref 145–400)
RBC: 3.75 10*6/uL (ref 3.70–5.32)
RDW: 14.9 % (ref 11.1–15.7)
WBC: 6.1 10*3/uL (ref 3.9–10.0)

## 2016-03-25 NOTE — Telephone Encounter (Addendum)
Left detailed information on patient's phone. Requested she contact her PCP/nephrologist. Also requested that patient call office back to confirm receipt of this message.   ----- Message from Eliezer Bottom, NP sent at 03/25/2016  1:54 PM EST ----- Regarding: Potassium Please let patient know that her potassium is 5.9. Is she symptomatic, if so have go to ED. Will need to follow-up with nephrology and PCP. Thank you!  Sarah   ----- Message ----- From: Interface, Lab In Three Zero One Sent: 03/25/2016  11:21 AM To: Eliezer Bottom, NP

## 2016-03-25 NOTE — Progress Notes (Signed)
Hematology and Oncology Follow Up Visit  Jillian Hayes [id number] 02/24/1945 71 y.o. 03/25/2016   Principle Diagnosis:  Anemia of chronic renal failure - stage 4  Current Therapy:   Aranesp 300 mcg SQ for Hgb < 10     Interim History:  Jillian Hayes is here today for follow-up. She is doing well but still having some minor fatigue and is "cold natured". Her Hgb has been stable and is 10.8 today. She does not qualify for Aranesp at this time. She is also concerned with all the side effects and the fact that she is sensitive to synthetic substances due to multiple chemical sensitivity.  She has hypertension as well and takes lisinopril, hydralazine, coreg and norvasc. No history of stroke or thrombus.  No fever, chills, n/v, cough, rash, dizziness, SOB, chest pain, palpitations, abdominal pain or changes in bowel or bladder habits.  No episodes of bleeding, bruising or petechiae. No lymphadenopathy found on exam.  No swelling, tenderness, numbness or tingling in her extremities. No c.o pain at this time.  She has maintained a good appetite and is staying well hydrated. Her weight is stable.   Medications:  Allergies as of 03/25/2016      Reactions   Sulfa Antibiotics Nausea And Vomiting      Medication List       Accurate as of 03/25/16 11:37 AM. Always use your most recent med list.          ALFALFA PO Take 4,500 mg by mouth daily.   amLODipine 5 MG tablet Commonly known as:  NORVASC Take 1 tablet (5 mg total) by mouth 2 (two) times daily.   b complex vitamins capsule Take 1 capsule by mouth daily.   CALCIUM + D PO Take 1,000 mg by mouth daily.   carvedilol 3.125 MG tablet Commonly known as:  COREG Take 1 tablet (3.125 mg total) by mouth 2 (two) times daily.   Ferrous Fumarate 324 (106 Fe) MG Tabs tablet Commonly known as:  HEMOCYTE Take 1 tablet (106 mg of iron total) by mouth daily.   FLAX SEED OIL PO Take 1 capsule by mouth 3 (three) times daily.     hydrALAZINE 10 MG tablet Commonly known as:  APRESOLINE TAKE ONE TABLET BY MOUTH THREE TIMES DAILY   L-GLUTAMINE PO Take 1 capsule by mouth daily.   lisinopril 5 MG tablet Commonly known as:  PRINIVIL,ZESTRIL TAKE ONE TABLET BY MOUTH TWICE DAILY   PROBIOTIC DAILY PO Take 1 tablet by mouth daily.   vitamin C 500 MG tablet Commonly known as:  ASCORBIC ACID Take 500 mg by mouth daily as needed.       Allergies:  Allergies  Allergen Reactions  . Sulfa Antibiotics Nausea And Vomiting    Past Medical History, Surgical history, Social history, and Family History were reviewed and updated.  Review of Systems: All other 10 point review of systems is negative.   Physical Exam:  weight is 159 lb (72.1 kg). Her oral temperature is 97.6 F (36.4 C). Her blood pressure is 139/55 (abnormal) and her pulse is 63. Her respiration is 20.   Wt Readings from Last 3 Encounters:  03/25/16 159 lb (72.1 kg)  03/02/16 162 lb (73.5 kg)  01/21/16 160 lb 9.6 oz (72.8 kg)    Ocular: Sclerae unicteric, pupils equal, round and reactive to light Ear-nose-throat: Oropharynx clear, dentition fair Lymphatic: No cervical supraclavicular or axillary adenopathy Lungs no rales or rhonchi, good excursion bilaterally Heart regular rate and  rhythm, no murmur appreciated Abd soft, nontender, positive bowel sounds, no liver or spleen tip palpated on exam, no fluid wave MSK no focal spinal tenderness, no joint edema Neuro: non-focal, well-oriented, appropriate affect Breasts: Deferred  Lab Results  Component Value Date   WBC 6.1 03/25/2016   HGB 10.8 (L) 03/25/2016   HCT 33.5 (L) 03/25/2016   MCV 89 03/25/2016   PLT 254 03/25/2016   Lab Results  Component Value Date   FERRITIN 141 03/02/2016   IRON 76 03/02/2016   TIBC 284 03/02/2016   UIBC 208 03/02/2016   IRONPCTSAT 27 03/02/2016   Lab Results  Component Value Date   RETICCTPCT 1.2 11/11/2015   RBC 3.75 03/25/2016   RETICCTABS 45,120  11/11/2015   No results found for: KPAFRELGTCHN, LAMBDASER, KAPLAMBRATIO No results found for: Kandis Cocking, IGMSERUM Lab Results  Component Value Date   TOTALPROTELP 6.1 02/21/2014   ALBUMINELP 56.4 02/21/2014   A1GS 5.3 (H) 02/21/2014   A2GS 12.8 (H) 02/21/2014   BETS 6.7 02/21/2014   BETA2SER 5.0 02/21/2014   GAMS 13.8 02/21/2014   MSPIKE NOT DET 02/21/2014   SPEI SEE NOTE 02/21/2014     Chemistry      Component Value Date/Time   NA 140 03/02/2016 1442   K 5.0 03/02/2016 1442   CL 105 01/21/2016 1355   CO2 24 03/02/2016 1442   BUN 33.5 (H) 03/02/2016 1442   CREATININE 1.9 (H) 03/02/2016 1442      Component Value Date/Time   CALCIUM 10.7 (H) 03/02/2016 1442   ALKPHOS 68 03/02/2016 1442   AST 37 (H) 03/02/2016 1442   ALT 28 03/02/2016 1442   BILITOT 0.30 03/02/2016 1442     Impression and Plan: Jillian Hayes is very pleasant 71 yo white female with history of mild anemia secondary to chronic kidney disease stage 4. Her counts are stable at this time and she states that the mild fatigue is tolerable.  With her history of multiple chemical sensitivity and HTN we will remain cautious if later her lab work indicates the need for Aranesp. There is no history of stroke or thrombus. She is in agreement with the plan.  For now we will continue to follow along with her and plan to see her back in 2 months fopr repeat lab work and follow-up.   Both she and her husband know to contact our office with any questions or concerns. We can certainly see her sooner if need be.   Eliezer Bottom, NP 2/21/201811:37 AM

## 2016-04-15 ENCOUNTER — Other Ambulatory Visit: Payer: Self-pay | Admitting: Family Medicine

## 2016-04-28 ENCOUNTER — Ambulatory Visit (INDEPENDENT_AMBULATORY_CARE_PROVIDER_SITE_OTHER): Payer: Medicare HMO | Admitting: Family Medicine

## 2016-04-28 ENCOUNTER — Encounter: Payer: Self-pay | Admitting: Family Medicine

## 2016-04-28 VITALS — BP 150/60 | HR 64 | Temp 98.1°F | Ht 67.0 in | Wt 159.8 lb

## 2016-04-28 DIAGNOSIS — N289 Disorder of kidney and ureter, unspecified: Secondary | ICD-10-CM

## 2016-04-28 DIAGNOSIS — D509 Iron deficiency anemia, unspecified: Secondary | ICD-10-CM

## 2016-04-28 DIAGNOSIS — N184 Chronic kidney disease, stage 4 (severe): Secondary | ICD-10-CM | POA: Diagnosis not present

## 2016-04-28 DIAGNOSIS — I359 Nonrheumatic aortic valve disorder, unspecified: Secondary | ICD-10-CM | POA: Insufficient documentation

## 2016-04-28 DIAGNOSIS — I38 Endocarditis, valve unspecified: Secondary | ICD-10-CM | POA: Diagnosis not present

## 2016-04-28 DIAGNOSIS — I10 Essential (primary) hypertension: Secondary | ICD-10-CM | POA: Diagnosis not present

## 2016-04-28 DIAGNOSIS — D631 Anemia in chronic kidney disease: Secondary | ICD-10-CM | POA: Diagnosis not present

## 2016-04-28 HISTORY — DX: Endocarditis, valve unspecified: I38

## 2016-04-28 LAB — CBC
HEMATOCRIT: 34.2 % — AB (ref 36.0–46.0)
HEMOGLOBIN: 11.2 g/dL — AB (ref 12.0–15.0)
MCHC: 32.7 g/dL (ref 30.0–36.0)
MCV: 85.7 fl (ref 78.0–100.0)
Platelets: 273 10*3/uL (ref 150.0–400.0)
RBC: 3.99 Mil/uL (ref 3.87–5.11)
RDW: 15.8 % — AB (ref 11.5–15.5)
WBC: 6.3 10*3/uL (ref 4.0–10.5)

## 2016-04-28 LAB — COMPREHENSIVE METABOLIC PANEL
ALK PHOS: 68 U/L (ref 39–117)
ALT: 18 U/L (ref 0–35)
AST: 26 U/L (ref 0–37)
Albumin: 4 g/dL (ref 3.5–5.2)
BILIRUBIN TOTAL: 0.3 mg/dL (ref 0.2–1.2)
BUN: 42 mg/dL — ABNORMAL HIGH (ref 6–23)
CO2: 23 meq/L (ref 19–32)
Calcium: 10.4 mg/dL (ref 8.4–10.5)
Chloride: 106 mEq/L (ref 96–112)
Creatinine, Ser: 1.91 mg/dL — ABNORMAL HIGH (ref 0.40–1.20)
GFR: 27.57 mL/min — AB (ref >60.00)
GLUCOSE: 88 mg/dL (ref 70–99)
POTASSIUM: 5.3 meq/L — AB (ref 3.5–5.1)
Sodium: 136 mEq/L (ref 135–145)
Total Protein: 6.8 g/dL (ref 6.0–8.3)

## 2016-04-28 NOTE — Assessment & Plan Note (Signed)
Has stopped all vitamins including MVI, calcium and vitamin D will check cmp today

## 2016-04-28 NOTE — Progress Notes (Signed)
Patient ID: Jillian Hayes, female   DOB: [birth year], 71 y.o.   MRN: [id number]   Subjective:  I acted as a Education administrator for Jillian Hayes, [place], [place]   Patient ID: Jillian Hayes, female    DOB: 05-Jun-1945, 71 y.o.   MRN: [id number]  Chief Complaint  Patient presents with  . Anemia    55-monthF/U. Iron deficiency    Anemia  Presents for follow-up visit. Symptoms include malaise/fatigue. There has been no fever or palpitations.    Patient is in today for a 359-month/U for iron deficiency anemia. Patient also has a Hx of HTN, renal insufficiency, depression, fatigue. Patient states that maybe 2 days per week, she is experiencing quite a bit of fatigue. Patient has no additional acute concerns noted at this time. She reports her BP readings at home have been labile a bit. Notes systolic can be 15277nd then 10 minutes later can be 140 but she feels well and offers no concerns regarding acute illness, fevers or hospitalizations. Denies CP/palp/SOB/HA/congestion/fevers/GI or GU c/o. Taking meds as prescribed  Patient Care Team: [facility] LukesMD as PCP - General (Family Medicine)   Past Medical History:  Diagnosis Date  . Advanced care planning/counseling discussion 06/10/2014   05/31/2014 patient presents copy of HCP and Living Will  . Allergy    seasonal  . Anemia    iron deficiency  . Anxiety   . Benign fundic gland polyps of stomach   . Bladder polyps 06/25/2010  . Chronic headaches 06/24/2010  . Chronic renal insufficiency, stage 4 (severe) ([place]/08/2015  . Depression 1991   hospitalized  . Fatigue 06/24/2010  . History of chicken pox 06/25/2010  . Hypercalcemia 02/18/2014  . Hypertension   . Insomnia 06/24/2010  . Medicare annual wellness visit, subsequent 11/09/2011  . Multiple chemical sensitivity syndrome 06/25/2010  . Overweight(278.02) 06/24/2010  . Proteinuria 02/18/2014  . Renal insufficiency 03/26/2011  . Sun-damaged skin 01/21/2016  . Valvular heart disease 04/28/2016     Past Surgical History:  Procedure Laterality Date  . cyst on left breast removed Left 1991   benign  . NASAL SEPTUM SURGERY  1986   rhinoplasty  . polyps on bladder removed  1972   benign  . TONSILLECTOMY  1962    Family History  Problem Relation Age of Onset  . Breast cancer Mother 6962  left breast removed, 2010 lung  . Diabetes Mother   . Nephrolithiasis Father   . Heart attack Father 5429  X 3  . Heart disease Father     smoker  . Hypertension Brother   . Melanoma Son 37    melanoma on leg removed  . Pancreatic cancer Maternal Grandmother   . Hypertension Paternal Grandmother   . Obesity Paternal Grandmother   . Heart attack Paternal Grandfather 4057. Diabetes Maternal Aunt     x 2  . Diabetes Maternal Uncle     x 3    Social History   Social History  . Marital status: Married    Spouse name: N/A  . Number of children: 2  . Years of education: N/A   Occupational History  . retired    Social History Main Topics  . Smoking status: Never Smoker  . Smokeless tobacco: Never Used  . Alcohol use No  . Drug use: No  . Sexual activity: Yes    Partners: Male     Comment: lives with husband, wears  with husband   Other Topics Concern  . Not on file   Social History Narrative   Lives with husband.      Outpatient Medications Prior to Visit  Medication Sig Dispense Refill  . ALFALFA PO Take 4,500 mg by mouth daily.      Marland Kitchen amLODipine (NORVASC) 5 MG tablet TAKE ONE TABLET BY MOUTH TWICE DAILY 180 tablet 1  . b complex vitamins capsule Take 1 capsule by mouth daily.    . carvedilol (COREG) 3.125 MG tablet Take 1 tablet (3.125 mg total) by mouth 2 (two) times daily. 180 tablet 1  . Ferrous Fumarate (HEMOCYTE) 324 (106 Fe) MG TABS tablet Take 1 tablet (106 mg of iron total) by mouth daily. 30 tablet 3  . Flaxseed, Linseed, (FLAX SEED OIL PO) Take 1 capsule by mouth 3 (three) times daily.    . hydrALAZINE (APRESOLINE) 10 MG tablet TAKE ONE TABLET BY MOUTH  THREE TIMES DAILY (Patient taking differently: Take 10 mg by mouth 2 (two) times daily. ) 180 tablet 1  . L-GLUTAMINE PO Take 1 capsule by mouth daily.    Marland Kitchen lisinopril (PRINIVIL,ZESTRIL) 5 MG tablet TAKE ONE TABLET BY MOUTH TWICE DAILY 180 tablet 1  . Probiotic Product (PROBIOTIC DAILY PO) Take 1 tablet by mouth daily.    . vitamin C (ASCORBIC ACID) 500 MG tablet Take 500 mg by mouth daily as needed.     . Calcium Carbonate-Vitamin D (CALCIUM + D PO) Take 1,000 mg by mouth daily.       No facility-administered medications prior to visit.     Allergies  Allergen Reactions  . Sulfa Antibiotics Nausea And Vomiting    Review of Systems  Constitutional: Positive for malaise/fatigue. Negative for fever.  HENT: Negative for congestion.   Eyes: Negative for blurred vision.  Respiratory: Negative for cough and shortness of breath.   Cardiovascular: Negative for chest pain, palpitations and leg swelling.  Gastrointestinal: Negative for vomiting.  Musculoskeletal: Negative for back pain.  Skin: Negative for rash.  Neurological: Negative for loss of consciousness and headaches.       Objective:    Physical Exam  Constitutional: She is oriented to person, place, and time. She appears well-developed and well-nourished. No distress.  HENT:  Head: Normocephalic and atraumatic.  Eyes: Conjunctivae are normal.  Neck: Normal range of motion. No thyromegaly present.  Cardiovascular: Normal rate and regular rhythm.   Pulmonary/Chest: Effort normal and breath sounds normal. She has no wheezes.  Abdominal: Soft. Bowel sounds are normal. There is no tenderness.  Musculoskeletal: She exhibits no edema or deformity.  Neurological: She is alert and oriented to person, place, and time.  Skin: Skin is warm and dry. She is not diaphoretic.  Psychiatric: She has a normal mood and affect.    BP (!) 150/60 (BP Location: Right Arm, Patient Position: Sitting, Cuff Size: Normal)   Pulse 64   Temp 98.1 F  (36.7 C) (Oral)   Ht _0  (1.702 m)   Wt 159 lb 12.8 oz (72.5 kg)   BMI 25.03 kg/m  Wt Readings from Last 3 Encounters:  04/28/16 159 lb 12.8 oz (72.5 kg)  03/25/16 159 lb (72.1 kg)  03/02/16 162 lb (73.5 kg)   BP Readings from Last 3 Encounters:  04/28/16 (!) 150/60  03/25/16 (!) 139/55  03/02/16 (!) 166/61     Immunization History  Administered Date(s) Administered  . Tdap 06/10/2012  . Zoster 01/03/2012    Health Maintenance  Topic  Date Due  . PNA vac Low Risk Adult (1 of 2 - PCV13) 11/23/2010  . INFLUENZA VACCINE  12/14/2016 (Originally 09/03/2015)  . MAMMOGRAM  09/10/2017  . TETANUS/TDAP  06/11/2022  . COLONOSCOPY  08/05/2022  . DEXA SCAN  Completed  . Hepatitis C Screening  Completed    Lab Results  Component Value Date   WBC 6.1 03/25/2016   HGB 10.8 (L) 03/25/2016   HCT 33.5 (L) 03/25/2016   PLT 254 03/25/2016   GLUCOSE 88 03/25/2016   CHOL 195 11/11/2015   TRIG 101.0 11/11/2015   HDL 83.10 11/11/2015   LDLCALC 91 11/11/2015   ALT 19 03/25/2016   AST 26 03/25/2016   NA 140 03/25/2016   K 5.9 No visable hemolysis (H) 03/25/2016   CL 105 01/21/2016   CREATININE 2.4 (H) 03/25/2016   BUN 51.8 (H) 03/25/2016   CO2 23 03/25/2016   TSH 0.60 11/11/2015    Lab Results  Component Value Date   TSH 0.60 11/11/2015   Lab Results  Component Value Date   WBC 6.1 03/25/2016   HGB 10.8 (L) 03/25/2016   HCT 33.5 (L) 03/25/2016   MCV 89 03/25/2016   PLT 254 03/25/2016   Lab Results  Component Value Date   NA 140 03/25/2016   K 5.9 No visable hemolysis (H) 03/25/2016   CHLORIDE 109 03/25/2016   CO2 23 03/25/2016   GLUCOSE 88 03/25/2016   BUN 51.8 (H) 03/25/2016   CREATININE 2.4 (H) 03/25/2016   BILITOT 0.35 03/25/2016   ALKPHOS 73 03/25/2016   AST 26 03/25/2016   ALT 19 03/25/2016   PROT 6.5 03/25/2016   ALBUMIN 3.5 03/25/2016   CALCIUM 10.5 (H) 03/25/2016   ANIONGAP 8 03/25/2016   EGFR 20 (L) 03/25/2016   GFR 25.72 (L) 01/21/2016   Lab  Results  Component Value Date   CHOL 195 11/11/2015   Lab Results  Component Value Date   HDL 83.10 11/11/2015   Lab Results  Component Value Date   LDLCALC 91 11/11/2015   Lab Results  Component Value Date   TRIG 101.0 11/11/2015   Lab Results  Component Value Date   CHOLHDL 2 11/11/2015   No results found for: HGBA1C       Assessment & Plan:   Problem List Items Addressed This Visit    Anemia - Primary   Relevant Orders   CBC   Comprehensive metabolic panel   RESOLVED: Renal insufficiency    Follows with nephrology. Potassium was high on last blood draw will recheck today, asymptomatic      Relevant Orders   CBC   Comprehensive metabolic panel   HTN (hypertension)    Well controlled, no changes to meds. Encouraged heart healthy diet such as the DASH diet and exercise as tolerated.       Relevant Orders   CBC   Comprehensive metabolic panel   Hypercalcemia    Has stopped all vitamins including MVI, calcium and vitamin D will check cmp today      Chronic renal insufficiency, stage 4 (severe) ([place])    Follows with nephrology at [facility] and has an appt soon.       Anemia of chronic renal failure, stage 4 (severe) (HCC)    Repeat CBC today and consider follow up with hematology if worsens      Aortic valve disease    Patient with mitral valve disease and LAE also noted on previous Echo and now with increasing pulse pressure. Will  proceed with repeat Echo and if any progression of disease will refer back to cardiology for further consideration         I have discontinued Ms. Delph Calcium Carbonate-Vitamin D (CALCIUM + D PO). I am also having her maintain her ALFALFA PO, vitamin C, (Flaxseed, Linseed, (FLAX SEED OIL PO)), b complex vitamins, L-GLUTAMINE PO, Probiotic Product (PROBIOTIC DAILY PO), hydrALAZINE, Ferrous Fumarate, carvedilol, lisinopril, and amLODipine.  No orders of the defined types were placed in this encounter.   CMA served  as Education administrator during this visit. History, Physical and Plan performed by medical provider. Documentation and orders reviewed and attested to.  Jillian Homans, MD

## 2016-04-28 NOTE — Patient Instructions (Signed)

## 2016-04-28 NOTE — Assessment & Plan Note (Signed)
Well controlled, no changes to meds. Encouraged heart healthy diet such as the DASH diet and exercise as tolerated.  °

## 2016-04-28 NOTE — Assessment & Plan Note (Signed)
Follows with nephrology at [facility] and has an appt soon.

## 2016-04-28 NOTE — Assessment & Plan Note (Signed)
Patient with mitral valve disease and LAE also noted on previous Echo and now with increasing pulse pressure. Will proceed with repeat Echo and if any progression of disease will refer back to cardiology for further consideration

## 2016-04-28 NOTE — Assessment & Plan Note (Signed)
Repeat CBC today and consider follow up with hematology if worsens

## 2016-04-28 NOTE — Progress Notes (Signed)
Pre visit review using our clinic review tool, if applicable. No additional management support is needed unless otherwise documented below in the visit note. 

## 2016-04-28 NOTE — Assessment & Plan Note (Signed)
Follows with nephrology. Potassium was high on last blood draw will recheck today, asymptomatic

## 2016-04-29 ENCOUNTER — Other Ambulatory Visit: Payer: Self-pay | Admitting: Family Medicine

## 2016-04-29 DIAGNOSIS — E875 Hyperkalemia: Secondary | ICD-10-CM

## 2016-05-07 ENCOUNTER — Ambulatory Visit (HOSPITAL_BASED_OUTPATIENT_CLINIC_OR_DEPARTMENT_OTHER)
Admission: RE | Admit: 2016-05-07 | Discharge: 2016-05-07 | Disposition: A | Discharge status: Home / Self Care | Payer: Medicare HMO | Source: Ambulatory Visit | Attending: Family Medicine | Admitting: Family Medicine

## 2016-05-07 DIAGNOSIS — I351 Nonrheumatic aortic (valve) insufficiency: Secondary | ICD-10-CM | POA: Diagnosis not present

## 2016-05-07 DIAGNOSIS — I38 Endocarditis, valve unspecified: Secondary | ICD-10-CM | POA: Insufficient documentation

## 2016-05-07 DIAGNOSIS — I371 Nonrheumatic pulmonary valve insufficiency: Secondary | ICD-10-CM | POA: Insufficient documentation

## 2016-05-07 DIAGNOSIS — I082 Rheumatic disorders of both aortic and tricuspid valves: Secondary | ICD-10-CM | POA: Diagnosis not present

## 2016-05-07 LAB — ECHOCARDIOGRAM COMPLETE
CHL CUP DOP CALC LVOT VTI: 25.9 cm
CHL CUP RV SYS PRESS: 27 mmHg
E decel time: 194 msec
E/e' ratio: 7.15
FS: 30 % (ref 28–44)
IVS/LV PW RATIO, ED: 0.91
LA ID, A-P, ES: 34 mm
LA diam index: 1.85 cm/m2
LA vol A4C: 55.2 ml
LA vol index: 34.7 mL/m2
LAVOL: 63.9 mL
LDCA: 3.46 cm2
LEFT ATRIUM END SYS DIAM: 34 mm
LV TDI E'LATERAL: 10.4
LV e' LATERAL: 10.4 cm/s
LVEEAVG: 7.15
LVEEMED: 7.15
LVOT diameter: 21 mm
LVOT peak grad rest: 7 mmHg
LVOT peak vel: 130 cm/s
LVOTSV: 90 mL
MV Dec: 194
MV pk E vel: 74.4 m/s
MVPG: 2 mmHg
MVPKAVEL: 78.8 m/s
P 1/2 time: 545 ms
PV Reg vel dias: 110 cm/s
PW: 8.47 mm — AB (ref 0.6–1.1)
RV LATERAL S' VELOCITY: 14.3 cm/s
Reg peak vel: 247 cm/s
TAPSE: 27.8 mm
TDI e' medial: 7.83
TR max vel: 247 cm/s

## 2016-05-07 NOTE — Progress Notes (Signed)
  Echocardiogram 2D Echocardiogram has been performed.  Johny Chess 05/07/2016, 11:14 AM

## 2016-05-08 ENCOUNTER — Encounter: Payer: Self-pay | Admitting: Family Medicine

## 2016-06-02 ENCOUNTER — Other Ambulatory Visit (INDEPENDENT_AMBULATORY_CARE_PROVIDER_SITE_OTHER): Payer: Medicare HMO

## 2016-06-02 DIAGNOSIS — E875 Hyperkalemia: Secondary | ICD-10-CM

## 2016-06-02 LAB — COMPREHENSIVE METABOLIC PANEL
ALK PHOS: 58 U/L (ref 39–117)
ALT: 15 U/L (ref 0–35)
AST: 23 U/L (ref 0–37)
Albumin: 3.7 g/dL (ref 3.5–5.2)
BUN: 42 mg/dL — ABNORMAL HIGH (ref 6–23)
CALCIUM: 10.1 mg/dL (ref 8.4–10.5)
CO2: 27 mEq/L (ref 19–32)
Chloride: 109 mEq/L (ref 96–112)
Creatinine, Ser: 1.84 mg/dL — ABNORMAL HIGH (ref 0.40–1.20)
GFR: 28.78 mL/min — AB (ref >60.00)
Glucose, Bld: 67 mg/dL — ABNORMAL LOW (ref 70–99)
Potassium: 4.8 mEq/L (ref 3.5–5.1)
Sodium: 140 mEq/L (ref 135–145)
TOTAL PROTEIN: 6.5 g/dL (ref 6.0–8.3)
Total Bilirubin: 0.4 mg/dL (ref 0.2–1.2)

## 2016-06-04 DIAGNOSIS — R69 Illness, unspecified: Secondary | ICD-10-CM | POA: Diagnosis not present

## 2016-06-05 ENCOUNTER — Ambulatory Visit: Payer: Medicare HMO | Admitting: Hematology & Oncology

## 2016-06-05 ENCOUNTER — Other Ambulatory Visit: Payer: Medicare HMO

## 2016-06-23 ENCOUNTER — Encounter: Payer: Self-pay | Admitting: Family Medicine

## 2016-06-23 ENCOUNTER — Ambulatory Visit (INDEPENDENT_AMBULATORY_CARE_PROVIDER_SITE_OTHER): Payer: Medicare HMO | Admitting: Family Medicine

## 2016-06-23 VITALS — BP 142/68 | HR 66 | Temp 98.1°F | Resp 18 | Wt 157.4 lb

## 2016-06-23 DIAGNOSIS — D649 Anemia, unspecified: Secondary | ICD-10-CM | POA: Diagnosis not present

## 2016-06-23 DIAGNOSIS — E663 Overweight: Secondary | ICD-10-CM

## 2016-06-23 DIAGNOSIS — I10 Essential (primary) hypertension: Secondary | ICD-10-CM | POA: Diagnosis not present

## 2016-06-23 LAB — COMPREHENSIVE METABOLIC PANEL
ALBUMIN: 4 g/dL (ref 3.5–5.2)
ALK PHOS: 55 U/L (ref 39–117)
ALT: 15 U/L (ref 0–35)
AST: 26 U/L (ref 0–37)
BILIRUBIN TOTAL: 0.4 mg/dL (ref 0.2–1.2)
BUN: 45 mg/dL — ABNORMAL HIGH (ref 6–23)
CALCIUM: 10.7 mg/dL — AB (ref 8.4–10.5)
CO2: 25 mEq/L (ref 19–32)
Chloride: 104 mEq/L (ref 96–112)
Creatinine, Ser: 1.82 mg/dL — ABNORMAL HIGH (ref 0.40–1.20)
GFR: 29.14 mL/min — AB (ref >60.00)
GLUCOSE: 92 mg/dL (ref 70–99)
Potassium: 4.9 mEq/L (ref 3.5–5.1)
Sodium: 137 mEq/L (ref 135–145)
TOTAL PROTEIN: 6.8 g/dL (ref 6.0–8.3)

## 2016-06-23 LAB — LIPID PANEL
CHOLESTEROL: 217 mg/dL — AB (ref 0–200)
HDL: 91.2 mg/dL (ref >39.00)
LDL Cholesterol: 102 mg/dL — ABNORMAL HIGH (ref 0–99)
NONHDL: 125.75
Total CHOL/HDL Ratio: 2
Triglycerides: 118 mg/dL (ref 0.0–149.0)
VLDL: 23.6 mg/dL (ref 0.0–40.0)

## 2016-06-23 LAB — CBC
HCT: 34.7 % — ABNORMAL LOW (ref 36.0–46.0)
HEMOGLOBIN: 11.3 g/dL — AB (ref 12.0–15.0)
MCHC: 32.7 g/dL (ref 30.0–36.0)
MCV: 86.5 fl (ref 78.0–100.0)
PLATELETS: 275 10*3/uL (ref 150.0–400.0)
RBC: 4.01 Mil/uL (ref 3.87–5.11)
RDW: 16 % — AB (ref 11.5–15.5)
WBC: 5.6 10*3/uL (ref 4.0–10.5)

## 2016-06-23 LAB — TSH: TSH: 0.62 u[IU]/mL (ref 0.35–4.50)

## 2016-06-23 MED ORDER — ZOSTER VAC RECOMB ADJUVANTED 50 MCG/0.5ML IM SUSR: 0.5000 mL | Freq: Once | INTRAMUSCULAR | 1 refills | Status: AC

## 2016-06-23 NOTE — Assessment & Plan Note (Signed)
Asymptomatic, cbc today, continue Ferrous sulfate

## 2016-06-23 NOTE — Progress Notes (Signed)
Subjective:  I acted as a Education administrator for Dr. Charlett Blake. [name], [place]  Patient ID: Jillian Hayes, female    DOB: 1946-01-07, 71 y.o.   MRN: [id number]  Chief Complaint  Patient presents with  . Follow-up  . Hypertension    Hypertension  This is a recurrent problem. The problem is unchanged. The problem is uncontrolled. Associated symptoms include malaise/fatigue. Pertinent negatives include no blurred vision, chest pain, headaches, palpitations or shortness of breath.    Patient is in today for a 2 month follow up visit, she is following up on her HTN, and other medical concerns. She has no acute concerns. She denies recent hospitalizations, SOB, falls or headaches. She declines the Prevnar 13 at this time she states she will receive it in the fall. No recent febrile illness or acute hospitalizations.   Patient Care Team: [name], MD as PCP - General (Family Medicine)   Past Medical History:  Diagnosis Date  . Advanced care planning/counseling discussion 06/10/2014   05/31/2014 patient presents copy of HCP and Living Will  . Allergy    seasonal  . Anemia    iron deficiency  . Anxiety   . Benign fundic gland polyps of stomach   . Bladder polyps 06/25/2010  . Chronic headaches 06/24/2010  . Chronic renal insufficiency, stage 4 (severe) ([place]) 11/09/2015  . Depression 1991   hospitalized  . Fatigue 06/24/2010  . History of chicken pox 06/25/2010  . Hypercalcemia 02/18/2014  . Hypertension   . Insomnia 06/24/2010  . Medicare annual wellness visit, subsequent 11/09/2011  . Multiple chemical sensitivity syndrome 06/25/2010  . Overweight(278.02) 06/24/2010  . Proteinuria 02/18/2014  . Renal insufficiency 03/26/2011  . Sun-damaged skin 01/21/2016  . Valvular heart disease 04/28/2016    Past Surgical History:  Procedure Laterality Date  . cyst on left breast removed Left 1991   benign  . NASAL SEPTUM SURGERY  1986   rhinoplasty  . polyps on bladder removed  1972   benign  .  TONSILLECTOMY  1962    Family History  Problem Relation Age of Onset  . Breast cancer Mother 20       left breast removed, 2010 lung  . Diabetes Mother   . Nephrolithiasis Father   . Heart attack Father 68       X 3  . Heart disease Father        smoker  . Hypertension Brother   . Melanoma Son 37       melanoma on leg removed  . Pancreatic cancer Maternal Grandmother   . Hypertension Paternal Grandmother   . Obesity Paternal Grandmother   . Heart attack Paternal Grandfather 24  . Diabetes Maternal Aunt        x 2  . Diabetes Maternal Uncle        x 3    Social History   Social History  . Marital status: Married    Spouse name: N/A  . Number of children: 2  . Years of education: N/A   Occupational History  . retired    Social History Main Topics  . Smoking status: Never Smoker  . Smokeless tobacco: Never Used  . Alcohol use No  . Drug use: No  . Sexual activity: Yes    Partners: Male     Comment: lives with husband, wears with husband   Other Topics Concern  . Not on file   Social History Narrative   Lives with husband.  Outpatient Medications Prior to Visit  Medication Sig Dispense Refill  . ALFALFA PO Take 4,500 mg by mouth daily.      Marland Kitchen amLODipine (NORVASC) 5 MG tablet TAKE ONE TABLET BY MOUTH TWICE DAILY 180 tablet 1  . b complex vitamins capsule Take 1 capsule by mouth daily.    . carvedilol (COREG) 3.125 MG tablet Take 1 tablet (3.125 mg total) by mouth 2 (two) times daily. 180 tablet 1  . Ferrous Fumarate (HEMOCYTE) 324 (106 Fe) MG TABS tablet Take 1 tablet (106 mg of iron total) by mouth daily. 30 tablet 3  . Flaxseed, Linseed, (FLAX SEED OIL PO) Take 1 capsule by mouth 3 (three) times daily.    . hydrALAZINE (APRESOLINE) 10 MG tablet TAKE ONE TABLET BY MOUTH THREE TIMES DAILY (Patient taking differently: Take 10 mg by mouth 2 (two) times daily. ) 180 tablet 1  . L-GLUTAMINE PO Take 1 capsule by mouth daily.    Marland Kitchen lisinopril (PRINIVIL,ZESTRIL)  5 MG tablet TAKE ONE TABLET BY MOUTH TWICE DAILY 180 tablet 1  . Probiotic Product (PROBIOTIC DAILY PO) Take 1 tablet by mouth daily.    . vitamin C (ASCORBIC ACID) 500 MG tablet Take 500 mg by mouth daily as needed.      No facility-administered medications prior to visit.     Allergies  Allergen Reactions  . Sulfa Antibiotics Nausea And Vomiting    Review of Systems  Constitutional: Positive for malaise/fatigue. Negative for fever.  HENT: Negative for congestion.   Eyes: Negative for blurred vision.  Respiratory: Negative for cough and shortness of breath.   Cardiovascular: Negative for chest pain, palpitations and leg swelling.  Gastrointestinal: Negative for vomiting.  Musculoskeletal: Negative for back pain.  Skin: Negative for rash.  Neurological: Negative for loss of consciousness and headaches.       Objective:    Physical Exam  Constitutional: She is oriented to person, place, and time. She appears well-developed and well-nourished. No distress.  HENT:  Head: Normocephalic and atraumatic.  Eyes: Conjunctivae are normal.  Neck: Normal range of motion. No thyromegaly present.  Cardiovascular: Normal rate and regular rhythm.   Pulmonary/Chest: Effort normal and breath sounds normal. She has no wheezes.  Abdominal: Soft. Bowel sounds are normal. There is no tenderness.  Musculoskeletal: Normal range of motion. She exhibits no edema or deformity.  Neurological: She is alert and oriented to person, place, and time.  Skin: Skin is warm and dry. She is not diaphoretic.  Psychiatric: She has a normal mood and affect.    BP (!) 142/68 (BP Location: Left Arm, Patient Position: Sitting, Cuff Size: Normal)   Pulse 66   Temp 98.1 F (36.7 C) (Oral)   Resp 18   Wt 157 lb 6.4 oz (71.4 kg)   SpO2 97%   BMI 24.65 kg/m  Wt Readings from Last 3 Encounters:  06/23/16 157 lb 6.4 oz (71.4 kg)  04/28/16 159 lb 12.8 oz (72.5 kg)  03/25/16 159 lb (72.1 kg)   BP Readings from  Last 3 Encounters:  06/23/16 (!) 142/68  04/28/16 (!) 150/60  03/25/16 (!) 139/55     Immunization History  Administered Date(s) Administered  . Tdap 06/10/2012  . Zoster 01/03/2012    Health Maintenance  Topic Date Due  . PNA vac Low Risk Adult (1 of 2 - PCV13) 11/23/2010  . INFLUENZA VACCINE  12/14/2016 (Originally 09/02/2016)  . MAMMOGRAM  09/10/2017  . TETANUS/TDAP  06/11/2022  . COLONOSCOPY  08/05/2022  .  DEXA SCAN  Completed  . Hepatitis C Screening  Completed    Lab Results  Component Value Date   WBC 6.3 04/28/2016   HGB 11.2 (L) 04/28/2016   HCT 34.2 (L) 04/28/2016   PLT 273.0 04/28/2016   GLUCOSE 67 (L) 06/02/2016   CHOL 195 11/11/2015   TRIG 101.0 11/11/2015   HDL 83.10 11/11/2015   LDLCALC 91 11/11/2015   ALT 15 06/02/2016   AST 23 06/02/2016   NA 140 06/02/2016   K 4.8 06/02/2016   CL 109 06/02/2016   CREATININE 1.84 (H) 06/02/2016   BUN 42 (H) 06/02/2016   CO2 27 06/02/2016   TSH 0.60 11/11/2015    Lab Results  Component Value Date   TSH 0.60 11/11/2015   Lab Results  Component Value Date   WBC 6.3 04/28/2016   HGB 11.2 (L) 04/28/2016   HCT 34.2 (L) 04/28/2016   MCV 85.7 04/28/2016   PLT 273.0 04/28/2016   Lab Results  Component Value Date   NA 140 06/02/2016   K 4.8 06/02/2016   CHLORIDE 109 03/25/2016   CO2 27 06/02/2016   GLUCOSE 67 (L) 06/02/2016   BUN 42 (H) 06/02/2016   CREATININE 1.84 (H) 06/02/2016   BILITOT 0.4 06/02/2016   ALKPHOS 58 06/02/2016   AST 23 06/02/2016   ALT 15 06/02/2016   PROT 6.5 06/02/2016   ALBUMIN 3.7 06/02/2016   CALCIUM 10.1 06/02/2016   ANIONGAP 8 03/25/2016   EGFR 20 (L) 03/25/2016   GFR 28.78 (L) 06/02/2016   Lab Results  Component Value Date   CHOL 195 11/11/2015   Lab Results  Component Value Date   HDL 83.10 11/11/2015   Lab Results  Component Value Date   LDLCALC 91 11/11/2015   Lab Results  Component Value Date   TRIG 101.0 11/11/2015   Lab Results  Component Value Date    CHOLHDL 2 11/11/2015   No results found for: HGBA1C       Assessment & Plan:   Problem List Items Addressed This Visit    Anemia    Asymptomatic, cbc today, continue Ferrous sulfate      Overweight    Encouraged DASH diet, decrease po intake and increase exercise as tolerated. Needs 7-8 hours of sleep nightly. Avoid trans fats, eat small, frequent meals every 4-5 hours with lean proteins, complex carbs and healthy fats. Minimize simple carbs      HTN (hypertension) - Primary    Well controlled, no changes to meds. Encouraged heart healthy diet such as the DASH diet and exercise as tolerated.       Relevant Orders   CBC   Comprehensive metabolic panel   Lipid panel   TSH   Hypercalcemia    Check cmp         I am having Ms. Marteney start on Zoster Vac Recomb Adjuvanted. I am also having her maintain her ALFALFA PO, vitamin C, (Flaxseed, Linseed, (FLAX SEED OIL PO)), b complex vitamins, L-GLUTAMINE PO, Probiotic Product (PROBIOTIC DAILY PO), hydrALAZINE, Ferrous Fumarate, carvedilol, lisinopril, and amLODipine.  Meds ordered this encounter  Medications  . Zoster Vac Recomb Adjuvanted [facility]) injection    Sig: Inject 0.5 mLs into the muscle once.    Dispense:  0.5 mL    Refill:  1    CMA served as scribe during this visit. History, Physical and Plan performed by medical provider. Documentation and orders reviewed and attested to.  [name], MD

## 2016-06-23 NOTE — Assessment & Plan Note (Signed)
Check cmp 

## 2016-06-23 NOTE — Assessment & Plan Note (Signed)
Well controlled, no changes to meds. Encouraged heart healthy diet such as the DASH diet and exercise as tolerated.  °

## 2016-06-23 NOTE — Patient Instructions (Signed)
Chronic Kidney Disease, Adult Chronic kidney disease (CKD) occurs when the kidneys are damaged during a period of 3 or more months. The kidneys are two organs that do many important jobs in the body, which include:  Removing wastes and extra fluids from the blood.  Making hormones that maintain the amount of fluid in your tissues and blood vessels.  Maintaining the right amount of fluids and chemicals in the body. A small amount of kidney damage may not cause problems, but a large amount of damage may make it difficult or impossible for the kidneys to work the way they should. If steps are not taken to slow down the kidney damage or stop it from getting worse, the kidneys may stop working permanently (end stage kidney disease). Most of the time, CKD does not go away, but it can often be controlled. People who have CKD can usually live normal lives. What are the causes? The most common causes of this condition are diabetes and high blood pressure (hypertension). Other causes include:  Heart and blood vessel (cardiovascular) disease.  Kidney diseases:  Glomerulonephritis.  Interstitial nephritis.  Polycystic kidney disease.  Renal vascular disease.  Diseases that affect the immune system.  Genetic diseases.  Medicines that damage the kidneys, such as anti-inflammatory medicines.  Poisoning.  Being around or in contact with poisonous (toxic) substances.  A kidney or urinary infection that occurs again (recurs).  Vasculitis.  Repeat kidney infections.  A problem with urine flow that may be caused by:  Cancer.  Having kidney stones more than one time.  An enlarged prostate in males. What increases the risk? This condition is more likely to develop in people who are:  Older than age 34.  Female.  Of African-American descent.  Current smokers or former smokers.  Obese. You may also have an increased risk for CKD if you have a family history of CKDor youfrequently  take medicines that are damaging to the kidneys. What are the signs or symptoms? Symptoms develop slowly and may not be obvious until the kidney damage becomes severe. It is possible to have a kidney disease for years without showing any symptoms. Symptoms of this condition can include:  Swelling (edema) of the face, legs, ankles, or feet.  Numbness, tingling, or loss of feeling (sensation) in the hands or feet.  Tiredness (lethargy).  Nausea or vomiting.  Confusion or trouble concentrating.  Problems with urination, such as:  Painful or burning feeling during urination.  Decreased urine production.  Frequent urination, especially at night.  Bloody urine.  Muscle twitches and cramps, especially in the legs.  Shortness of breath.  Weakness.  Constant itchiness.  Loss of appetite.  Metallic taste in the mouth.  Trouble sleeping.  Pale lining of the eyelids and surface of the eye (conjunctiva). How is this diagnosed? This condition may be diagnosed with various tests. Tests may include:  Blood tests.  Urine tests.  Imaging tests.  A test in which a sample of tissue is removed from the kidneys to be looked at under a microscope (kidney biopsy). These test results will help your health care provider determine what class of CKD you have. How is this treated? Most cases of CKD cannot be cured. Treatment usually involves relieving symptoms and preventing or slowing the progression of the disease. Treatment may include:  A special diet, which may require you to avoid alcohol, salty foods (sodium), and foods that are high in potassium, calcium, and protein.  Medicines:  To lower blood  pressure.  To relieve low blood count (anemia).  To relieve swelling.  To protect your bones.  To improve the balance of electrolytes in your blood.  Removing toxic waste from the body by using hemodialysis or peritoneal dialysis if the kidneys can no longer do their job (kidney  failure).  Management of other conditions that are causing your CKD or making it worse. Follow these instructions at home:  Follow your prescribed diet.  Take over-the-counter and prescription medicines only as told by your health care provider.  Do not take any new medicines unless approved by your health care provider. Many medicines can worsen your kidney damage.  Do not take any vitamin and mineral supplements unless approved by your health care provider. Many nutritional supplements can worsen your kidney damage.  The dose of some medicines that you take may need to be adjusted.  Do not use any tobacco products, such as cigarettes, chewing tobacco, and e-cigarettes. If you need help quitting, ask your health care provider.  Keep all follow-up visits as told by your health care provider. This is important.  Keep track of your blood pressure. Report changes in your blood pressure as told by your health care provider.  Achieve and maintain a healthy weight. If you need help with this, ask your health care provider.  Start or continue an exercise plan. Try to exercise at least 30 minutes a day, 5 days a week.  Stay current with immunizations as told by your health care provider. Where to find more information:  American Association of Kidney Patients: BombTimer.gl  National Kidney Foundation: www.kidney.[place]: https://mathis.com/  [facility]: www.lifeoptions.org and www.kidneyschool.org Contact a health care provider if:  Your symptoms get worse.  You develop new symptoms. Get help right away if:  You develop symptoms of end-stage kidney disease, which include:  Headaches.  Abnormally dark or light skin.  Numbness in the hands or feet.  Easy bruising.  Frequent hiccups.  Chest pain.  Shortness of breath.  End of menstruation in women.  You have a fever.  You have decreased urine production.  You have pain or  bleeding when you urinate. This information is not intended to replace advice given to you by your health care provider. Make sure you discuss any questions you have with your health care provider. Document Released: 10/29/2007 Document Revised: 06/27/2015 Document Reviewed: 09/18/2011 Elsevier Interactive Patient Education  2017 [employer].

## 2016-06-23 NOTE — Assessment & Plan Note (Signed)
Encouraged DASH diet, decrease po intake and increase exercise as tolerated. Needs 7-8 hours of sleep nightly. Avoid trans fats, eat small, frequent meals every 4-5 hours with lean proteins, complex carbs and healthy fats. Minimize simple carbs 

## 2016-07-19 ENCOUNTER — Encounter (HOSPITAL_BASED_OUTPATIENT_CLINIC_OR_DEPARTMENT_OTHER): Payer: Self-pay | Admitting: Emergency Medicine

## 2016-07-19 ENCOUNTER — Emergency Department (HOSPITAL_BASED_OUTPATIENT_CLINIC_OR_DEPARTMENT_OTHER): Payer: Medicare HMO

## 2016-07-19 ENCOUNTER — Emergency Department (HOSPITAL_BASED_OUTPATIENT_CLINIC_OR_DEPARTMENT_OTHER)
Admission: EM | Admit: 2016-07-19 | Discharge: 2016-07-19 | Disposition: A | Discharge status: Home / Self Care | Payer: Medicare HMO | Attending: Emergency Medicine | Admitting: Emergency Medicine

## 2016-07-19 DIAGNOSIS — R42 Dizziness and giddiness: Secondary | ICD-10-CM | POA: Diagnosis not present

## 2016-07-19 DIAGNOSIS — R51 Headache: Secondary | ICD-10-CM | POA: Diagnosis not present

## 2016-07-19 DIAGNOSIS — I129 Hypertensive chronic kidney disease with stage 1 through stage 4 chronic kidney disease, or unspecified chronic kidney disease: Secondary | ICD-10-CM | POA: Insufficient documentation

## 2016-07-19 DIAGNOSIS — N184 Chronic kidney disease, stage 4 (severe): Secondary | ICD-10-CM | POA: Diagnosis not present

## 2016-07-19 DIAGNOSIS — Z79899 Other long term (current) drug therapy: Secondary | ICD-10-CM | POA: Diagnosis not present

## 2016-07-19 LAB — CBC WITH DIFFERENTIAL/PLATELET
BASOS ABS: 0 10*3/uL (ref 0.0–0.1)
BASOS PCT: 1 %
Eosinophils Absolute: 0.3 10*3/uL (ref 0.0–0.7)
Eosinophils Relative: 4 %
HEMATOCRIT: 34.3 % — AB (ref 36.0–46.0)
Hemoglobin: 11.1 g/dL — ABNORMAL LOW (ref 12.0–15.0)
Lymphocytes Relative: 22 %
Lymphs Abs: 1.4 10*3/uL (ref 0.7–4.0)
MCH: 28.6 pg (ref 26.0–34.0)
MCHC: 32.4 g/dL (ref 30.0–36.0)
MCV: 88.4 fL (ref 78.0–100.0)
MONO ABS: 0.4 10*3/uL (ref 0.1–1.0)
Monocytes Relative: 7 %
NEUTROS ABS: 4.3 10*3/uL (ref 1.7–7.7)
Neutrophils Relative %: 66 %
PLATELETS: 239 10*3/uL (ref 150–400)
RBC: 3.88 MIL/uL (ref 3.87–5.11)
RDW: 14.9 % (ref 11.5–15.5)
WBC: 6.4 10*3/uL (ref 4.0–10.5)

## 2016-07-19 LAB — URINALYSIS, ROUTINE W REFLEX MICROSCOPIC
Bilirubin Urine: NEGATIVE
Glucose, UA: NEGATIVE mg/dL
Hgb urine dipstick: NEGATIVE
Ketones, ur: NEGATIVE mg/dL
LEUKOCYTES UA: NEGATIVE
NITRITE: NEGATIVE
PROTEIN: 100 mg/dL — AB
Specific Gravity, Urine: 1.009 (ref 1.005–1.030)
pH: 5 (ref 5.0–8.0)

## 2016-07-19 LAB — COMPREHENSIVE METABOLIC PANEL
ALT: 18 U/L (ref 14–54)
AST: 28 U/L (ref 15–41)
Albumin: 3.8 g/dL (ref 3.5–5.0)
Alkaline Phosphatase: 61 U/L (ref 38–126)
Anion gap: 6 (ref 5–15)
BILIRUBIN TOTAL: 0.6 mg/dL (ref 0.3–1.2)
BUN: 45 mg/dL — AB (ref 6–20)
CHLORIDE: 108 mmol/L (ref 101–111)
CO2: 24 mmol/L (ref 22–32)
Calcium: 10.6 mg/dL — ABNORMAL HIGH (ref 8.9–10.3)
Creatinine, Ser: 1.84 mg/dL — ABNORMAL HIGH (ref 0.44–1.00)
GFR, EST AFRICAN AMERICAN: 31 mL/min — AB (ref >60)
GFR, EST NON AFRICAN AMERICAN: 27 mL/min — AB (ref >60)
Glucose, Bld: 106 mg/dL — ABNORMAL HIGH (ref 65–99)
POTASSIUM: 5 mmol/L (ref 3.5–5.1)
Sodium: 138 mmol/L (ref 135–145)
TOTAL PROTEIN: 6.6 g/dL (ref 6.5–8.1)

## 2016-07-19 LAB — URINALYSIS, MICROSCOPIC (REFLEX)

## 2016-07-19 LAB — TROPONIN I: Troponin I: <0.03 ng/mL (ref <0.03)

## 2016-07-19 MED ORDER — MECLIZINE HCL 25 MG PO TABS
12.5000 mg | ORAL_TABLET | Freq: Once | ORAL | Status: AC
  Administered 2016-07-19: 12.5 mg via ORAL
  Filled 2016-07-19: qty 1

## 2016-07-19 MED ORDER — DIPHENHYDRAMINE HCL 50 MG/ML IJ SOLN
12.5000 mg | Freq: Once | INTRAMUSCULAR | Status: AC
  Administered 2016-07-19: 12.5 mg via INTRAVENOUS
  Filled 2016-07-19: qty 1

## 2016-07-19 MED ORDER — METOCLOPRAMIDE HCL 5 MG/ML IJ SOLN
5.0000 mg | Freq: Once | INTRAMUSCULAR | Status: AC
  Administered 2016-07-19: 5 mg via INTRAVENOUS
  Filled 2016-07-19: qty 2

## 2016-07-19 MED ORDER — SODIUM CHLORIDE 0.9 % IV BOLUS (SEPSIS)
500.0000 mL | Freq: Once | INTRAVENOUS | Status: AC
  Administered 2016-07-19: 500 mL via INTRAVENOUS

## 2016-07-19 MED ORDER — ONDANSETRON 4 MG PO TBDP: 4.0000 mg | ORAL_TABLET | Freq: Three times a day (TID) | ORAL | 0 refills | Status: DC | PRN

## 2016-07-19 NOTE — ED Notes (Signed)
Pt c/o increased nausea. Emesis bag given, RN made aware.

## 2016-07-19 NOTE — ED Triage Notes (Signed)
Pt c/o dizziness, fairly constant, since Wed. + Nausea, HA. Felt near syncopal today.

## 2016-07-19 NOTE — ED Notes (Signed)
Pt ambulating in hallway with EDP

## 2016-07-19 NOTE — ED Notes (Signed)
Pt amb to [facility]

## 2016-07-19 NOTE — ED Provider Notes (Signed)
[place] DEPT [facility] Provider Note   CSN: [id number] Arrival date & time: 07/19/16  1046     History   Chief Complaint Chief Complaint  Patient presents with  . Dizziness    HPI Jillian Hayes is a 71 y.o. female.  The history is provided by the patient. No language interpreter was used.  Dizziness   Jillian Hayes is a 71 y.o. female who presents to the Emergency Department complaining of dizziness.  She is experiencing dizziness since Wednesday with associated nausea and frontal headache. Dizziness is described as an off-balance sensation and feeling as if she is falling. This worse with movement and activity but it is persistent in nature. She denies any numbness, weakness, chest pain, shortness of breath, diaphoresis, abdominal pain. No prior similar symptoms. Past Medical History:  Diagnosis Date  . Advanced care planning/counseling discussion 06/10/2014   05/31/2014 patient presents copy of HCP and Living Will  . Allergy    seasonal  . Anemia    iron deficiency  . Anxiety   . Benign fundic gland polyps of stomach   . Bladder polyps 06/25/2010  . Chronic headaches 06/24/2010  . Chronic renal insufficiency, stage 4 (severe) ([place]) 11/09/2015  . Depression 1991   hospitalized  . Fatigue 06/24/2010  . History of chicken pox 06/25/2010  . Hypercalcemia 02/18/2014  . Hypertension   . Insomnia 06/24/2010  . Medicare annual wellness visit, subsequent 11/09/2011  . Multiple chemical sensitivity syndrome 06/25/2010  . Overweight(278.02) 06/24/2010  . Proteinuria 02/18/2014  . Renal insufficiency 03/26/2011  . Sun-damaged skin 01/21/2016  . Valvular heart disease 04/28/2016    Patient Active Problem List   Diagnosis Date Noted  . Aortic valve disease 04/28/2016  . Anemia of chronic renal failure, stage 4 (severe) ([place]) 03/03/2016  . Sun-damaged skin 01/21/2016  . Chronic renal insufficiency, stage 4 (severe) ([facility]) 11/09/2015  . Advanced care planning/counseling discussion  06/10/2014  . Pedal edema 03/28/2014  . Proteinuria 02/18/2014  . Hypercalcemia 02/18/2014  . Abnormal EKG 01/22/2014  . HTN (hypertension) 01/22/2014  . Dermatitis 06/30/2013  . Abnormal thyroid function test 06/25/2013  . Diarrhea 06/12/2012  . Cervical cancer screening 06/10/2012  . Medicare annual wellness visit, subsequent 11/09/2011  . Multiple chemical sensitivity syndrome 06/25/2010  . History of chicken pox 06/25/2010  . Bladder polyps 06/25/2010  . History of cardiac dysrhythmia 06/25/2010  . Insomnia 06/24/2010  . Overweight 06/24/2010  . Fatigue 06/24/2010  . Chronic headaches 06/24/2010  . Allergy   . Anemia   . Depression     Past Surgical History:  Procedure Laterality Date  . cyst on left breast removed Left 1991   benign  . NASAL SEPTUM SURGERY  1986   rhinoplasty  . polyps on bladder removed  1972   benign  . TONSILLECTOMY  1962    OB History    No data available       Home Medications    Prior to Admission medications   Medication Sig Start Date End Date Taking? Authorizing Provider  ALFALFA PO Take 4,500 mg by mouth daily.      [provider]  amLODipine (NORVASC) 5 MG tablet TAKE ONE TABLET BY MOUTH TWICE DAILY 04/15/16   [name], MD  b complex vitamins capsule Take 1 capsule by mouth daily.    [provider]  carvedilol (COREG) 3.125 MG tablet Take 1 tablet (3.125 mg total) by mouth 2 (two) times daily. 01/21/16   [name],  MD  Ferrous Fumarate (HEMOCYTE) 324 (106 Fe) MG TABS tablet Take 1 tablet (106 mg of iron total) by mouth daily. 11/12/15   [name], MD  Flaxseed, Linseed, (FLAX SEED OIL PO) Take 1 capsule by mouth 3 (three) times daily.    [provider]  hydrALAZINE (APRESOLINE) 10 MG tablet TAKE ONE TABLET BY MOUTH THREE TIMES DAILY Patient taking differently: Take 10 mg by mouth 2 (two) times daily.  08/01/15   [name], MD  L-GLUTAMINE PO Take 1 capsule by mouth daily.     [provider]  lisinopril (PRINIVIL,ZESTRIL) 5 MG tablet TAKE ONE TABLET BY MOUTH TWICE DAILY 02/05/16   [name], MD  ondansetron (ZOFRAN ODT) 4 MG disintegrating tablet Take 1 tablet (4 mg total) by mouth every 8 (eight) hours as needed for nausea or vomiting. 07/19/16   Quintella Reichert, MD  Probiotic Product (PROBIOTIC DAILY PO) Take 1 tablet by mouth daily.    [provider]  vitamin C (ASCORBIC ACID) 500 MG tablet Take 500 mg by mouth daily as needed.     [provider]    Family History Family History  Problem Relation Age of Onset  . Breast cancer Mother 54       left breast removed, 2010 lung  . Diabetes Mother   . Nephrolithiasis Father   . Heart attack Father 73       X 3  . Heart disease Father        smoker  . Hypertension Brother   . Melanoma Son 37       melanoma on leg removed  . Pancreatic cancer Maternal Grandmother   . Hypertension Paternal Grandmother   . Obesity Paternal Grandmother   . Heart attack Paternal Grandfather 79  . Diabetes Maternal Aunt        x 2  . Diabetes Maternal Uncle        x 3    Social History Social History  Substance Use Topics  . Smoking status: Never Smoker  . Smokeless tobacco: Never Used  . Alcohol use No     Allergies   Sulfa antibiotics   Review of Systems Review of Systems  Neurological: Positive for dizziness.  All other systems reviewed and are negative.    Physical Exam Updated Vital Signs BP (!) 155/62   Pulse 72   Temp 97.3 F (36.3 C) (Oral)   Resp 20   Ht 5\' 6"  (1.676 m)   Wt 71.2 kg (157 lb)   SpO2 98%   BMI 25.34 kg/m   Physical Exam  Constitutional: She is oriented to person, place, and time. She appears well-developed and well-nourished.  HENT:  Head: Normocephalic and atraumatic.  Eyes: Pupils are equal, round, and reactive to light.  Nystagmus on gaze to right and left.  Cardiovascular: Normal rate and regular rhythm.   No murmur  heard. Pulmonary/Chest: Effort normal and breath sounds normal. No respiratory distress.  Abdominal: Soft. There is no tenderness. There is no rebound and no guarding.  Musculoskeletal: She exhibits no edema or tenderness.  Neurological: She is alert and oriented to person, place, and time.  5 out of 5 strength in all 4 extremities with sensation to light touch intact in all 4 extremities  Skin: Skin is warm and dry.  Psychiatric: She has a normal mood and affect. Her behavior is normal.  Nursing note and vitals reviewed.    ED Treatments / Results  Labs (all  labs ordered are listed, but only abnormal results are displayed) Labs Reviewed  COMPREHENSIVE METABOLIC PANEL - Abnormal; Notable for the following:       Result Value   Glucose, Bld 106 (*)    BUN 45 (*)    Creatinine, Ser 1.84 (*)    Calcium 10.6 (*)    GFR calc non Af Amer 27 (*)    GFR calc Af Amer 31 (*)    All other components within normal limits  CBC WITH DIFFERENTIAL/PLATELET - Abnormal; Notable for the following:    Hemoglobin 11.1 (*)    HCT 34.3 (*)    All other components within normal limits  URINALYSIS, ROUTINE W REFLEX MICROSCOPIC - Abnormal; Notable for the following:    Protein, ur 100 (*)    All other components within normal limits  URINALYSIS, MICROSCOPIC (REFLEX) - Abnormal; Notable for the following:    Bacteria, UA RARE (*)    Squamous Epithelial / LPF 0-5 (*)    All other components within normal limits  TROPONIN I  TROPONIN I    EKG  EKG Interpretation  Date/Time:  Sunday July 19 2016 11:05:45 EDT Ventricular Rate:  63 PR Interval:    QRS Duration: 74 QT Interval:  386 QTC Calculation: 396 R Axis:   50 Text Interpretation:  Sinus rhythm Probable left atrial enlargement Left ventricular hypertrophy Probable anterior infarct, old ST elevation, consider inferior injury vs early repol No significant change since last tracing Confirmed by Hazle Coca [phone]) on 07/19/2016 11:28:27 AM        Radiology Dg Chest 2 View  Result Date: 07/19/2016 CLINICAL DATA:  Dizziness.  Nausea and headache EXAM: CHEST  2 VIEW COMPARISON:  01/16/2014 FINDINGS: The heart size appears normal. Aortic atherosclerosis. No pleural effusion or edema. No airspace opacities. Lungs appear hyperinflated but clear. IMPRESSION: 1. No active disease. 2.  Aortic Atherosclerosis (ICD10-I70.0). Electronically Signed   By: Kerby Moors M.D.   On: 07/19/2016 13:06   Ct Head Wo Contrast  Result Date: 07/19/2016 CLINICAL DATA:  Headache and vertigo. EXAM: CT HEAD WITHOUT CONTRAST TECHNIQUE: Contiguous axial images were obtained from the base of the skull through the vertex without intravenous contrast. COMPARISON:  None. FINDINGS: Brain: Scattered white matter changes. No acute cortical ischemia or infarct is identified. Cerebellum, brainstem, and basal cisterns are normal. No mass, mass effect, or midline shift. Ventricles and sulci are unremarkable. No subdural, epidural, or subarachnoid hemorrhage. Vascular: No hyperdense vessel or unexpected calcification. Skull: Normal. Negative for fracture or focal lesion. Sinuses/Orbits: No acute finding. Other: None. IMPRESSION: No acute intracranial abnormality identified. White matter changes are noted. Electronically Signed   By: Dorise Bullion III M.D   On: 07/19/2016 13:13    Procedures Procedures (including critical care time)  Medications Ordered in ED Medications  sodium chloride 0.9 % bolus 500 mL (0 mLs Intravenous Stopped 07/19/16 1148)  metoCLOPramide (REGLAN) injection 5 mg (5 mg Intravenous Given 07/19/16 1453)  diphenhydrAMINE (BENADRYL) injection 12.5 mg (12.5 mg Intravenous Given 07/19/16 1449)  meclizine (ANTIVERT) tablet 12.5 mg (12.5 mg Oral Given 07/19/16 1443)     Initial Impression / Assessment and Plan / ED Course  I have reviewed the triage vital signs and the nursing notes.  Pertinent labs & imaging results that were available during my care of  the patient were reviewed by me and considered in my medical decision making (see chart for details).     Patient here for evaluation of dizziness described as  a off-balance sensation as well as frontal headache for the last several days. She is nontoxic appearing on examination. Neurologic examination is normal except for slight nystagmus on gaze to the right. She is able to ambulate without difficulty in the department. Presentation is not consistent with subarachnoid hemorrhage, acute CVA, meningitis, ACS, PE, hypertensive urgency. Labs demonstrate stable renal insufficiency. UA is not consistent with UTI. Her headache is improved on repeat assessment in the emergency department following treatment. Plan to DC home with PCP follow-up and return precautions.  Final Clinical Impressions(s) / ED Diagnoses   Final diagnoses:  Vertigo    New Prescriptions Discharge Medication List as of 07/19/2016  3:27 PM    START taking these medications   Details  ondansetron (ZOFRAN ODT) 4 MG disintegrating tablet Take 1 tablet (4 mg total) by mouth every 8 (eight) hours as needed for nausea or vomiting., Starting Sun 07/19/2016, Print         Quintella Reichert, MD 07/19/16 [phone]

## 2016-07-22 ENCOUNTER — Other Ambulatory Visit: Payer: Self-pay | Admitting: Family Medicine

## 2016-07-24 ENCOUNTER — Telehealth: Payer: Self-pay | Admitting: Family Medicine

## 2016-07-24 ENCOUNTER — Ambulatory Visit: Payer: Self-pay | Admitting: Adult Health

## 2016-07-24 NOTE — Telephone Encounter (Signed)
Patient Name: Jillian Hayes DOB: 11/23/1945 Initial Comment caller scheduled appointment online but has heart palpitations an office called an transfered her to nurse triage . She went in to hospital on sunday with heart palpitations , headache , dizziness . hospital thouht it might be vertigo but said to follow up with pcp . she has an appointment monday at 1:15 . hospital did CT scan an ekg . she is concerned because she has history of kidney disease an just wants to be seen at the office Nurse Assessment Nurse: Yolanda Bonine, RN, [name] Date/Time [name] Time): 07/24/2016 9:54:33 AM Confirm and document reason for call. If symptomatic, describe symptoms. ---caller states no new symptoms but just has been feeling tired. Seeing MD on Monday. She went in to hospital on sunday with heart palpitations , headache , dizziness . hospital thouht it might be vertigo but they said to follow up with pcp. Has appt on Monday at 1pm. hospital did CT scan an ekg . she is concerned because she has history of kidney disease an just wants to be seen at the office Does the patient have any new or worsening symptoms? ---Yes Will a triage be completed? ---Yes Related visit to physician within the last 2 weeks? ---Yes Does the PT have any chronic conditions? (i.e. diabetes, asthma, etc.) ---Yes List chronic conditions. ---Chronic Disease stage 4 HTN Is this a behavioral health or substance abuse call? ---No Guidelines Guideline Title Affirmed Question Affirmed Notes Heart Rate and Heartbeat Questions Age > 60 years (Exception: brief heart beat symptoms that went away and now feels well) Final Disposition User See Physician within 4 Hours (or PCP triage) Yolanda Bonine, RN, [name] Comments unstable in my head... dizzy not severe She is running errands with her husband 69 hear trate now regular per patient Had symptoms of heart palpiyations last evening Pt stated CT scan done without Contrast at hospital on  Sunday. Pt takes: Clonidine Carvedilol Lisinopril Amlodipine Vit with iron B Complex ] Flax Seed Alphalfa  Appt at [id number] with [facility] [phone] Pt given information with phone number to call and get specific directions

## 2016-07-24 NOTE — Telephone Encounter (Signed)
Per Jillian Peng, NP I called pt to assess. She reports increased fatigue for several weeks along with sporadic episodes of heart palpitations. She denies any issues or increased s/s with her CKD/anemia. She states that she is not having any palpitations at this time. She denies CP, headache, visual disturbance or any other symptoms. Advised pt to keep her appt with PCP on Monday, seek UC or ED for any significant increase in symptoms. Pt voiced understanding, she will keep appt with PCP. Nothing further needed at this time.

## 2016-07-27 ENCOUNTER — Ambulatory Visit (INDEPENDENT_AMBULATORY_CARE_PROVIDER_SITE_OTHER): Payer: Medicare HMO | Admitting: Family Medicine

## 2016-07-27 ENCOUNTER — Encounter: Payer: Self-pay | Admitting: Family Medicine

## 2016-07-27 VITALS — BP 130/68 | HR 66 | Temp 97.7°F | Resp 18 | Wt 158.0 lb

## 2016-07-27 DIAGNOSIS — N184 Chronic kidney disease, stage 4 (severe): Secondary | ICD-10-CM | POA: Diagnosis not present

## 2016-07-27 DIAGNOSIS — I10 Essential (primary) hypertension: Secondary | ICD-10-CM | POA: Diagnosis not present

## 2016-07-27 DIAGNOSIS — R002 Palpitations: Secondary | ICD-10-CM | POA: Diagnosis not present

## 2016-07-27 DIAGNOSIS — T7840XD Allergy, unspecified, subsequent encounter: Secondary | ICD-10-CM | POA: Diagnosis not present

## 2016-07-27 DIAGNOSIS — R946 Abnormal results of thyroid function studies: Secondary | ICD-10-CM

## 2016-07-27 DIAGNOSIS — D649 Anemia, unspecified: Secondary | ICD-10-CM | POA: Diagnosis not present

## 2016-07-27 DIAGNOSIS — R51 Headache: Secondary | ICD-10-CM | POA: Diagnosis not present

## 2016-07-27 DIAGNOSIS — R519 Headache, unspecified: Secondary | ICD-10-CM

## 2016-07-27 DIAGNOSIS — I359 Nonrheumatic aortic valve disorder, unspecified: Secondary | ICD-10-CM | POA: Diagnosis not present

## 2016-07-27 DIAGNOSIS — N289 Disorder of kidney and ureter, unspecified: Secondary | ICD-10-CM | POA: Diagnosis not present

## 2016-07-27 MED ORDER — ASPIRIN EC 81 MG PO TBEC: 81.0000 mg | DELAYED_RELEASE_TABLET | Freq: Every day | ORAL | Status: DC

## 2016-07-27 NOTE — Assessment & Plan Note (Signed)
CBC in ER stable and mild, will repeat cbc later this week

## 2016-07-27 NOTE — Assessment & Plan Note (Signed)
cmp at hospital unremarkable for change and will repeat later this week.

## 2016-07-27 NOTE — Assessment & Plan Note (Signed)
Well controlled, no changes to meds. Encouraged heart healthy diet such as the DASH diet and exercise as tolerated.  °

## 2016-07-27 NOTE — Assessment & Plan Note (Signed)
With worsening disequilibrium, light headedness with bending over and palpitations. So will refer to cardiology for further consideration

## 2016-07-27 NOTE — Progress Notes (Signed)
Subjective:  I acted as a Education administrator for Dr. Charlett Blake. Carnel Stegman, [place]  Patient ID: Jillian Hayes, female    DOB: 02-Jan-1946, 71 y.o.   MRN: [id number]  No chief complaint on file.   HPI  Patient is in today for follow up. She was recently seen in the ER for Vertigo however she states today she states she still does not feel well. With worsening disequilibrium, light headedness with bending over and palpitations still present and occurring daily at this time. She denies any CP or SOB. She is noting some headaches with bending over and some worsening visual acuity. No change in appetite and she reports she is eating and drinking well. She denies any tinnitus or hearing concerns. She is noting a sense of a lump in her throat and questioning if it is allergies but no other allergic complaints. She notes one episode of severe reflux with brachish fluid to throat and she has had the sense of gobus since. No cough or fevers.   Patient Care Team: [name], MD as PCP - General (Family Medicine)   Past Medical History:  Diagnosis Date  . Advanced care planning/counseling discussion 06/10/2014   05/31/2014 patient presents copy of HCP and Living Will  . Allergy    seasonal  . Anemia    iron deficiency  . Anxiety   . Benign fundic gland polyps of stomach   . Bladder polyps 06/25/2010  . Chronic headaches 06/24/2010  . Chronic renal insufficiency, stage 4 (severe) ([place]) 11/09/2015  . Depression 1991   hospitalized  . Fatigue 06/24/2010  . History of chicken pox 06/25/2010  . Hypercalcemia 02/18/2014  . Hypertension   . Insomnia 06/24/2010  . Medicare annual wellness visit, subsequent 11/09/2011  . Multiple chemical sensitivity syndrome 06/25/2010  . Overweight(278.02) 06/24/2010  . Proteinuria 02/18/2014  . Renal insufficiency 03/26/2011  . Sun-damaged skin 01/21/2016  . Valvular heart disease 04/28/2016    Past Surgical History:  Procedure Laterality Date  . cyst on left breast removed Left 1991     benign  . NASAL SEPTUM SURGERY  1986   rhinoplasty  . polyps on bladder removed  1972   benign  . TONSILLECTOMY  1962    Family History  Problem Relation Age of Onset  . Breast cancer Mother 16       left breast removed, 2010 lung  . Diabetes Mother   . Nephrolithiasis Father   . Heart attack Father 60       X 3  . Heart disease Father        smoker  . Hypertension Brother   . Melanoma Son 37       melanoma on leg removed  . Pancreatic cancer Maternal Grandmother   . Hypertension Paternal Grandmother   . Obesity Paternal Grandmother   . Heart attack Paternal Grandfather 40  . Diabetes Maternal Aunt        x 2  . Diabetes Maternal Uncle        x 3    Social History   Social History  . Marital status: Married    Spouse name: N/A  . Number of children: 2  . Years of education: N/A   Occupational History  . retired    Social History Main Topics  . Smoking status: Never Smoker  . Smokeless tobacco: Never Used  . Alcohol use No  . Drug use: No  . Sexual activity: Yes    Partners: Male  Comment: lives with husband, wears with husband   Other Topics Concern  . Not on file   Social History Narrative   Lives with husband.      Outpatient Medications Prior to Visit  Medication Sig Dispense Refill  . ALFALFA PO Take 4,500 mg by mouth daily.      Marland Kitchen amLODipine (NORVASC) 5 MG tablet TAKE ONE TABLET BY MOUTH TWICE DAILY 180 tablet 1  . b complex vitamins capsule Take 1 capsule by mouth daily.    . carvedilol (COREG) 3.125 MG tablet TAKE ONE TABLET BY MOUTH TWICE DAILY 180 tablet 1  . Ferrous Fumarate (HEMOCYTE) 324 (106 Fe) MG TABS tablet Take 1 tablet (106 mg of iron total) by mouth daily. 30 tablet 3  . Flaxseed, Linseed, (FLAX SEED OIL PO) Take 1 capsule by mouth 3 (three) times daily.    . hydrALAZINE (APRESOLINE) 10 MG tablet TAKE ONE TABLET BY MOUTH THREE TIMES DAILY (Patient taking differently: Take 10 mg by mouth 2 (two) times daily. ) 180 tablet 1  .  L-GLUTAMINE PO Take 1 capsule by mouth daily.    Marland Kitchen lisinopril (PRINIVIL,ZESTRIL) 5 MG tablet TAKE ONE TABLET BY MOUTH TWICE DAILY 180 tablet 1  . Probiotic Product (PROBIOTIC DAILY PO) Take 1 tablet by mouth daily.    . vitamin C (ASCORBIC ACID) 500 MG tablet Take 500 mg by mouth daily as needed.     . ondansetron (ZOFRAN ODT) 4 MG disintegrating tablet Take 1 tablet (4 mg total) by mouth every 8 (eight) hours as needed for nausea or vomiting. 10 tablet 0   No facility-administered medications prior to visit.     Allergies  Allergen Reactions  . Sulfa Antibiotics Nausea And Vomiting    ROS     Objective:    Physical Exam  BP 130/68 (BP Location: Left Arm, Patient Position: Sitting, Cuff Size: Normal)   Pulse 66   Temp 97.7 F (36.5 C) (Oral)   Resp 18   Wt 158 lb (71.7 kg)   SpO2 98%   BMI 25.50 kg/m  Wt Readings from Last 3 Encounters:  07/27/16 158 lb (71.7 kg)  07/19/16 157 lb (71.2 kg)  06/23/16 157 lb 6.4 oz (71.4 kg)   BP Readings from Last 3 Encounters:  07/27/16 130/68  07/19/16 (!) 155/62  06/23/16 (!) 142/68     Immunization History  Administered Date(s) Administered  . Tdap 06/10/2012  . Zoster 01/03/2012    Health Maintenance  Topic Date Due  . PNA vac Low Risk Adult (1 of 2 - PCV13) 11/23/2010  . INFLUENZA VACCINE  12/14/2016 (Originally 09/02/2016)  . MAMMOGRAM  09/10/2017  . TETANUS/TDAP  06/11/2022  . COLONOSCOPY  08/05/2022  . DEXA SCAN  Completed  . Hepatitis C Screening  Completed    Lab Results  Component Value Date   WBC 6.4 07/19/2016   HGB 11.1 (L) 07/19/2016   HCT 34.3 (L) 07/19/2016   PLT 239 07/19/2016   GLUCOSE 106 (H) 07/19/2016   CHOL 217 (H) 06/23/2016   TRIG 118.0 06/23/2016   HDL 91.20 06/23/2016   LDLCALC 102 (H) 06/23/2016   ALT 18 07/19/2016   AST 28 07/19/2016   NA 138 07/19/2016   K 5.0 07/19/2016   CL 108 07/19/2016   CREATININE 1.84 (H) 07/19/2016   BUN 45 (H) 07/19/2016   CO2 24 07/19/2016   TSH 0.62  06/23/2016    Lab Results  Component Value Date   TSH 0.62 06/23/2016  Lab Results  Component Value Date   WBC 6.4 07/19/2016   HGB 11.1 (L) 07/19/2016   HCT 34.3 (L) 07/19/2016   MCV 88.4 07/19/2016   PLT 239 07/19/2016   Lab Results  Component Value Date   NA 138 07/19/2016   K 5.0 07/19/2016   CHLORIDE 109 03/25/2016   CO2 24 07/19/2016   GLUCOSE 106 (H) 07/19/2016   BUN 45 (H) 07/19/2016   CREATININE 1.84 (H) 07/19/2016   BILITOT 0.6 07/19/2016   ALKPHOS 61 07/19/2016   AST 28 07/19/2016   ALT 18 07/19/2016   PROT 6.6 07/19/2016   ALBUMIN 3.8 07/19/2016   CALCIUM 10.6 (H) 07/19/2016   ANIONGAP 6 07/19/2016   EGFR 20 (L) 03/25/2016   GFR 29.14 (L) 06/23/2016   Lab Results  Component Value Date   CHOL 217 (H) 06/23/2016   Lab Results  Component Value Date   HDL 91.20 06/23/2016   Lab Results  Component Value Date   LDLCALC 102 (H) 06/23/2016   Lab Results  Component Value Date   TRIG 118.0 06/23/2016   Lab Results  Component Value Date   CHOLHDL 2 06/23/2016   No results found for: HGBA1C       Assessment & Plan:   Problem List Items Addressed This Visit    None      I have discontinued Ms. Pietrzak ondansetron. I am also having her maintain her ALFALFA PO, vitamin C, (Flaxseed, Linseed, (FLAX SEED OIL PO)), b complex vitamins, L-GLUTAMINE PO, Probiotic Product (PROBIOTIC DAILY PO), hydrALAZINE, Ferrous Fumarate, lisinopril, amLODipine, and carvedilol.  No orders of the defined types were placed in this encounter.   CMA served as Education administrator during this visit. History, Physical and Plan performed by medical provider. Documentation and orders reviewed and attested to.  Magdalene Molly, [place]

## 2016-07-27 NOTE — Patient Instructions (Signed)
Aortic Insufficiency Aortic insufficiency is a condition in which the aortic valve does not close all the way. The aortic valve is a gate-like structure between the lower left chamber of the heart (left ventricle) and the main blood vessel that supplies blood to the rest of the body (aorta). The aortic valve opens when the left ventricle squeezes to pump blood into the aorta, and it closes when the left ventricle relaxes. In aortic insufficiency, blood in the aorta leaks through the aortic valve after it has closed. This causes the heart to work harder than usual. If aortic insufficiency is not treated, it causes enlargement and weakening of the left ventricle. This can result in heart failure, abnormal heart rhythms (arrhythmias), and other dangerous conditions. If this condition develops suddenly, it may need to be treated with emergency surgery. What are the causes? This condition may be caused by anything that weakens the aortic valve, such as:  Severe high blood pressure (hypertension).  Inflammation of the inner layer of the heart or the heart valves (endocarditis).  A ballooning of a weak spot in the aorta wall (aortic aneurysm).  A tear or separation of the inner walls of the aorta (aortic dissection).  Injury (trauma) that damages the aortic valve.  Certain medicines.  Disease of a protein in the body called collagen (collagen vascular disease).  A problem that is present at birth (birth defect).  An inflammatory condition that can develop after an untreated strep throat infection (rheumatic fever).  Complications during or after a heart surgery (rare).  What are the signs or symptoms? Symptoms of this condition include:  Fatigue.  Shortness of breath.  Difficulty breathing while lying flat (orthopnea). You may need to sleep on two or more pillows to breathe better.  Chest discomfort (angina).  Head bobbing.  A fluttering feeling in your chest (palpitations).  An  irregular or faster-than-normal heartbeat.  Symptoms usually develop gradually, unless this condition was caused by a major injury or by endocarditis. How is this diagnosed? This condition is diagnosed based on the results of:  A physical exam.  An imaging test that uses sound waves to produce images of the heart (echocardiogram).  You may also have other tests to confirm the diagnosis, including:  Chest X-ray.  MRI.  A test that records the electrical impulses of the heart (electrocardiogram, ECG).  CT angiogram (CTA). In this procedure, a large X-ray machine, called a CT scanner, takes detailed pictures of blood vessels after dye has been injected into the vessels.  Aortic angiogram. In this procedure, X-ray images are taken after dye has been injected into blood vessels. This tests the function of the aorta.  How is this treated? Treatment depends on your symptoms, how severe the condition is, and what problems the condition is causing. Treatment may include:  Observation. If your condition is mild, you may not need treatment. However, you will need to have your condition checked regularly to make sure it is not getting worse or causing serious problems.  Medicines that help the heart work more efficiently.  Surgery to repair or replace the valve, in severe cases. Surgery is usually recommended if the left ventricle enlarges beyond a certain point. If aortic insufficiency occurs suddenly, surgery may be needed immediately.  Follow these instructions at home:  Take over-the-counter and prescription medicines only as told by your health care provider.  Do not use any products that contain nicotine or tobacco, such as cigarettes and e-cigarettes. If you need help quitting,  ask your health care provider.  If directed by your health care provider, avoid heavy weight lifting and contact sports such as football.  Keep all follow-up visits as told by your health care provider. This  is important. You may need regular tests to monitor your condition and how well your heart is pumping blood.  Follow instructions from your health care provider about eating or drinking restrictions. Your health care provider may recommend that you: ? Limit alcohol intake to no more than 1 drink a day for nonpregnant women and 2 drinks a day for men. One drink equals 12 oz of beer, 5 oz of wine, or 1 oz of hard liquor. ? Eat foods that are high in fiber, such as fresh fruits and vegetables, whole grains, and beans. ? Eat less salt (sodium) and salty foods. Check ingredients and nutrition facts on packaged foods and beverages. Contact a health care provider if:  Your chest symptoms seem to be getting worse.  Your breathing problems seem to be getting worse.  You feel dizzy or close to fainting.  You have swelling in your feet, ankles, legs, or abdomen.  You urinate more than usual during the night (nocturia).  You have an unexplained fever that lasts 2 days or longer.  You develop new symptoms. Get help right away if:  You have severe chest pain.  You have severe shortness of breath.  You feel rapid or irregular heartbeats.  You feel light-headed.  You have sudden, unexplained weight gain. Summary  Aortic insufficiency is a condition in which the aortic valve does not close all the way. This causes the heart to work harder than usual.  This condition may be treated with observation, medicines, or surgery.  Take over-the-counter and prescription medicines only as told by your health care provider.  Eat less salt (sodium) and salty foods. Check ingredients and nutrition facts on packaged foods and beverages. This information is not intended to replace advice given to you by your health care provider. Make sure you discuss any questions you have with your health care provider. Document Released: 07/26/2002 Document Revised: 03/04/2016 Document Reviewed: 12/09/2015 Elsevier  Interactive Patient Education  [employer].

## 2016-07-27 NOTE — Assessment & Plan Note (Signed)
Recently flared and with disequilibrium CT scan was done in ER which showed white matter changes. With her persistent changes will proceed with MRI of brain to rule out any acute process and she will take ECASA 81 mg daily

## 2016-07-27 NOTE — Assessment & Plan Note (Signed)
Recent TSH normal.  

## 2016-07-27 NOTE — Assessment & Plan Note (Signed)
Has a sense of globus will try Zyrtec and Zantac daily qhs and see if that is helpful

## 2016-07-30 ENCOUNTER — Other Ambulatory Visit (INDEPENDENT_AMBULATORY_CARE_PROVIDER_SITE_OTHER): Payer: Medicare HMO

## 2016-07-30 DIAGNOSIS — N289 Disorder of kidney and ureter, unspecified: Secondary | ICD-10-CM

## 2016-07-30 LAB — CBC
HEMATOCRIT: 33.2 % — AB (ref 36.0–46.0)
Hemoglobin: 10.9 g/dL — ABNORMAL LOW (ref 12.0–15.0)
MCHC: 33 g/dL (ref 30.0–36.0)
MCV: 86.6 fl (ref 78.0–100.0)
PLATELETS: 249 10*3/uL (ref 150.0–400.0)
RBC: 3.83 Mil/uL — ABNORMAL LOW (ref 3.87–5.11)
RDW: 15.6 % — ABNORMAL HIGH (ref 11.5–15.5)
WBC: 5.3 10*3/uL (ref 4.0–10.5)

## 2016-07-30 LAB — COMPREHENSIVE METABOLIC PANEL
ALBUMIN: 3.8 g/dL (ref 3.5–5.2)
ALK PHOS: 54 U/L (ref 39–117)
ALT: 14 U/L (ref 0–35)
AST: 24 U/L (ref 0–37)
BUN: 44 mg/dL — ABNORMAL HIGH (ref 6–23)
CHLORIDE: 109 meq/L (ref 96–112)
CO2: 26 mEq/L (ref 19–32)
Calcium: 10.4 mg/dL (ref 8.4–10.5)
Creatinine, Ser: 1.94 mg/dL — ABNORMAL HIGH (ref 0.40–1.20)
GFR: 27.06 mL/min — AB (ref >60.00)
GLUCOSE: 91 mg/dL (ref 70–99)
Potassium: 4.6 mEq/L (ref 3.5–5.1)
Sodium: 141 mEq/L (ref 135–145)
Total Bilirubin: 0.3 mg/dL (ref 0.2–1.2)
Total Protein: 6.3 g/dL (ref 6.0–8.3)

## 2016-08-01 ENCOUNTER — Ambulatory Visit (HOSPITAL_BASED_OUTPATIENT_CLINIC_OR_DEPARTMENT_OTHER)
Admission: RE | Admit: 2016-08-01 | Discharge: 2016-08-01 | Disposition: A | Discharge status: Home / Self Care | Payer: Medicare HMO | Source: Ambulatory Visit | Attending: Family Medicine | Admitting: Family Medicine

## 2016-08-01 DIAGNOSIS — R51 Headache: Secondary | ICD-10-CM | POA: Insufficient documentation

## 2016-08-01 DIAGNOSIS — R519 Headache, unspecified: Secondary | ICD-10-CM

## 2016-08-06 ENCOUNTER — Other Ambulatory Visit: Payer: Self-pay | Admitting: Family Medicine

## 2016-08-11 ENCOUNTER — Other Ambulatory Visit: Payer: Self-pay | Admitting: Family Medicine

## 2016-08-11 ENCOUNTER — Telehealth: Payer: Self-pay | Admitting: Family Medicine

## 2016-08-11 DIAGNOSIS — J3081 Allergic rhinitis due to animal (cat) (dog) hair and dander: Secondary | ICD-10-CM

## 2016-08-11 NOTE — Telephone Encounter (Signed)
Referral done

## 2016-08-11 NOTE — Telephone Encounter (Signed)
Relation to OD:QVHQ Call back number:[phone]  Reason for call:  Patient requesting allergist referral, patient would like to check if she's allergic to cats,please advise

## 2016-08-20 DIAGNOSIS — H25813 Combined forms of age-related cataract, bilateral: Secondary | ICD-10-CM | POA: Diagnosis not present

## 2016-08-20 DIAGNOSIS — H5203 Hypermetropia, bilateral: Secondary | ICD-10-CM | POA: Diagnosis not present

## 2016-08-20 DIAGNOSIS — H52221 Regular astigmatism, right eye: Secondary | ICD-10-CM | POA: Diagnosis not present

## 2016-09-22 ENCOUNTER — Encounter: Payer: Self-pay | Admitting: Family Medicine

## 2016-09-22 ENCOUNTER — Ambulatory Visit (INDEPENDENT_AMBULATORY_CARE_PROVIDER_SITE_OTHER): Payer: Medicare HMO | Admitting: Family Medicine

## 2016-09-22 ENCOUNTER — Telehealth: Payer: Self-pay | Admitting: Family Medicine

## 2016-09-22 ENCOUNTER — Ambulatory Visit: Payer: Medicare HMO | Admitting: Family Medicine

## 2016-09-22 DIAGNOSIS — D649 Anemia, unspecified: Secondary | ICD-10-CM

## 2016-09-22 DIAGNOSIS — T7840XD Allergy, unspecified, subsequent encounter: Secondary | ICD-10-CM

## 2016-09-22 DIAGNOSIS — I10 Essential (primary) hypertension: Secondary | ICD-10-CM

## 2016-09-22 DIAGNOSIS — N184 Chronic kidney disease, stage 4 (severe): Secondary | ICD-10-CM

## 2016-09-22 DIAGNOSIS — E663 Overweight: Secondary | ICD-10-CM

## 2016-09-22 DIAGNOSIS — R002 Palpitations: Secondary | ICD-10-CM | POA: Insufficient documentation

## 2016-09-22 LAB — CBC
HCT: 34.1 % — ABNORMAL LOW (ref 36.0–46.0)
Hemoglobin: 11.2 g/dL — ABNORMAL LOW (ref 12.0–15.0)
MCHC: 32.9 g/dL (ref 30.0–36.0)
MCV: 88.1 fl (ref 78.0–100.0)
PLATELETS: 258 10*3/uL (ref 150.0–400.0)
RBC: 3.87 Mil/uL (ref 3.87–5.11)
RDW: 15.3 % (ref 11.5–15.5)
WBC: 4.9 10*3/uL (ref 4.0–10.5)

## 2016-09-22 LAB — COMPREHENSIVE METABOLIC PANEL
ALT: 15 U/L (ref 0–35)
AST: 24 U/L (ref 0–37)
Albumin: 3.5 g/dL (ref 3.5–5.2)
Alkaline Phosphatase: 54 U/L (ref 39–117)
BILIRUBIN TOTAL: 0.4 mg/dL (ref 0.2–1.2)
BUN: 48 mg/dL — ABNORMAL HIGH (ref 6–23)
CALCIUM: 10.4 mg/dL (ref 8.4–10.5)
CHLORIDE: 107 meq/L (ref 96–112)
CO2: 28 meq/L (ref 19–32)
Creatinine, Ser: 2.03 mg/dL — ABNORMAL HIGH (ref 0.40–1.20)
GFR: 25.67 mL/min — AB (ref >60.00)
Glucose, Bld: 87 mg/dL (ref 70–99)
POTASSIUM: 4.8 meq/L (ref 3.5–5.1)
Sodium: 138 mEq/L (ref 135–145)
Total Protein: 6.6 g/dL (ref 6.0–8.3)

## 2016-09-22 NOTE — Patient Instructions (Signed)
Anemia, Nonspecific Anemia is a condition in which the concentration of red blood cells or hemoglobin in the blood is below normal. Hemoglobin is a substance in red blood cells that carries oxygen to the tissues of the body. Anemia results in not enough oxygen reaching these tissues. What are the causes? Common causes of anemia include:  Excessive bleeding. Bleeding may be internal or external. This includes excessive bleeding from periods (in women) or from the intestine.  Poor nutrition.  Chronic kidney, thyroid, and liver disease.  Bone marrow disorders that decrease red blood cell production.  Cancer and treatments for cancer.  HIV, AIDS, and their treatments.  Spleen problems that increase red blood cell destruction.  Blood disorders.  Excess destruction of red blood cells due to infection, medicines, and autoimmune disorders. What are the signs or symptoms?  Minor weakness.  Dizziness.  Headache.  Palpitations.  Shortness of breath, especially with exercise.  Paleness.  Cold sensitivity.  Indigestion.  Nausea.  Difficulty sleeping.  Difficulty concentrating. Symptoms may occur suddenly or they may develop slowly. How is this diagnosed? Additional blood tests are often needed. These help your health care provider determine the best treatment. Your health care provider will check your stool for blood and look for other causes of blood loss. How is this treated? Treatment varies depending on the cause of the anemia. Treatment can include:  Supplements of iron, vitamin B12, or folic acid.  Hormone medicines.  A blood transfusion. This may be needed if blood loss is severe.  Hospitalization. This may be needed if there is significant continual blood loss.  Dietary changes.  Spleen removal. Follow these instructions at home: Keep all follow-up appointments. It often takes many weeks to correct anemia, and having your health care provider check on your  condition and your response to treatment is very important. Get help right away if:  You develop extreme weakness, shortness of breath, or chest pain.  You become dizzy or have trouble concentrating.  You develop heavy vaginal bleeding.  You develop a rash.  You have bloody or black, tarry stools.  You faint.  You vomit up blood.  You vomit repeatedly.  You have abdominal pain.  You have a fever or persistent symptoms for more than 2-3 days.  You have a fever and your symptoms suddenly get worse.  You are dehydrated. This information is not intended to replace advice given to you by your health care provider. Make sure you discuss any questions you have with your health care provider. Document Released: 02/27/2004 Document Revised: 07/03/2015 Document Reviewed: 07/15/2012 Elsevier Interactive Patient Education  2017 Elsevier Inc.  

## 2016-09-22 NOTE — Assessment & Plan Note (Addendum)
Check renal functions, follows with [facility]

## 2016-09-22 NOTE — Progress Notes (Signed)
Subjective:  I acted as a Education administrator for Dr. Charlett Blake. [name], [place]  Patient ID: Jillian Hayes, female    DOB: 1945/08/27, 71 y.o.   MRN: [id number]  No chief complaint on file.   HPI  Patient is in today for a follow up. She states she is doing well. No recent febrile illness or acute hospitalizations. Denies CP/palp/SOB/fevers/GI or GU c/o. Taking meds as prescribed. She has been struggling with headaches and light headedness. She tried Cetirizine and she felt it made her more dizzy and lethargic so she stopped it. No falls or syncope. Is trying to eat well and hydrate well.   Patient Care Team: [name], MD as PCP - General (Family Medicine)   Past Medical History:  Diagnosis Date  . Advanced care planning/counseling discussion 06/10/2014   05/31/2014 patient presents copy of HCP and Living Will  . Allergy    seasonal  . Anemia    iron deficiency  . Anxiety   . Benign fundic gland polyps of stomach   . Bladder polyps 06/25/2010  . Chronic headaches 06/24/2010  . Chronic renal insufficiency, stage 4 (severe) ([place]) 11/09/2015  . Depression 1991   hospitalized  . Fatigue 06/24/2010  . History of chicken pox 06/25/2010  . Hypercalcemia 02/18/2014  . Hypertension   . Insomnia 06/24/2010  . Medicare annual wellness visit, subsequent 11/09/2011  . Multiple chemical sensitivity syndrome 06/25/2010  . Overweight(278.02) 06/24/2010  . Palpitation 09/22/2016  . Proteinuria 02/18/2014  . Renal insufficiency 03/26/2011  . Sun-damaged skin 01/21/2016  . Valvular heart disease 04/28/2016    Past Surgical History:  Procedure Laterality Date  . cyst on left breast removed Left 1991   benign  . NASAL SEPTUM SURGERY  1986   rhinoplasty  . polyps on bladder removed  1972   benign  . TONSILLECTOMY  1962    Family History  Problem Relation Age of Onset  . Breast cancer Mother 51       left breast removed, 2010 lung  . Diabetes Mother   . Nephrolithiasis Father   . Heart attack Father  51       X 3  . Heart disease Father        smoker  . Hypertension Brother   . Melanoma Son 37       melanoma on leg removed  . Pancreatic cancer Maternal Grandmother   . Hypertension Paternal Grandmother   . Obesity Paternal Grandmother   . Heart attack Paternal Grandfather 32  . Diabetes Maternal Aunt        x 2  . Diabetes Maternal Uncle        x 3    Social History   Social History  . Marital status: Married    Spouse name: N/A  . Number of children: 2  . Years of education: N/A   Occupational History  . retired    Social History Main Topics  . Smoking status: Never Smoker  . Smokeless tobacco: Never Used  . Alcohol use No  . Drug use: No  . Sexual activity: Yes    Partners: Male     Comment: lives with husband, wears with husband   Other Topics Concern  . Not on file   Social History Narrative   Lives with husband.      Outpatient Medications Prior to Visit  Medication Sig Dispense Refill  . ALFALFA PO Take 4,500 mg by mouth daily.      Marland Kitchen amLODipine (  NORVASC) 5 MG tablet TAKE ONE TABLET BY MOUTH TWICE DAILY 180 tablet 1  . aspirin EC 81 MG tablet Take 1 tablet (81 mg total) by mouth daily.    Marland Kitchen b complex vitamins capsule Take 1 capsule by mouth daily.    . carvedilol (COREG) 3.125 MG tablet TAKE ONE TABLET BY MOUTH TWICE DAILY 180 tablet 1  . Ferrous Fumarate (HEMOCYTE) 324 (106 Fe) MG TABS tablet Take 1 tablet (106 mg of iron total) by mouth daily. 30 tablet 3  . Flaxseed, Linseed, (FLAX SEED OIL PO) Take 1 capsule by mouth 3 (three) times daily.    . hydrALAZINE (APRESOLINE) 10 MG tablet TAKE ONE TABLET BY MOUTH THREE TIMES DAILY (Patient taking differently: Take 10 mg by mouth 2 (two) times daily. ) 180 tablet 1  . L-GLUTAMINE PO Take 1 capsule by mouth daily.    Marland Kitchen lisinopril (PRINIVIL,ZESTRIL) 5 MG tablet TAKE ONE TABLET BY MOUTH TWICE DAILY 180 tablet 1  . lisinopril (PRINIVIL,ZESTRIL) 5 MG tablet TAKE ONE TABLET BY MOUTH TWICE DAILY 180 tablet 1  .  Probiotic Product (PROBIOTIC DAILY PO) Take 1 tablet by mouth daily.    . vitamin C (ASCORBIC ACID) 500 MG tablet Take 500 mg by mouth daily as needed.      No facility-administered medications prior to visit.     Allergies  Allergen Reactions  . Sulfa Antibiotics Nausea And Vomiting    Review of Systems  Constitutional: Positive for malaise/fatigue. Negative for fever.  HENT: Positive for congestion.   Eyes: Negative for blurred vision.  Respiratory: Negative for cough and shortness of breath.   Cardiovascular: Negative for chest pain, palpitations and leg swelling.  Gastrointestinal: Negative for vomiting.  Musculoskeletal: Negative for back pain.  Skin: Negative for rash.  Neurological: Positive for headaches. Negative for loss of consciousness.       Objective:    Physical Exam  Constitutional: She is oriented to person, place, and time. She appears well-developed and well-nourished. No distress.  HENT:  Head: Normocephalic and atraumatic.  Eyes: Conjunctivae are normal.  Neck: Normal range of motion. No thyromegaly present.  Cardiovascular: Normal rate and regular rhythm.   Pulmonary/Chest: Effort normal and breath sounds normal. She has no wheezes.  Abdominal: Soft. Bowel sounds are normal. There is no tenderness.  Musculoskeletal: Normal range of motion. She exhibits no edema or deformity.  Neurological: She is alert and oriented to person, place, and time.  Skin: Skin is warm and dry. She is not diaphoretic.  Psychiatric: She has a normal mood and affect.    BP (!) 118/56 (BP Location: Left Arm, Patient Position: Sitting, Cuff Size: Normal)   Pulse 68   Temp 97.9 F (36.6 C) (Oral)   Resp 18   Wt 156 lb 12.8 oz (71.1 kg)   SpO2 98%   BMI 25.31 kg/m  Wt Readings from Last 3 Encounters:  09/22/16 156 lb 12.8 oz (71.1 kg)  07/27/16 158 lb (71.7 kg)  07/19/16 157 lb (71.2 kg)   BP Readings from Last 3 Encounters:  09/22/16 (!) 118/56  07/27/16 130/68    07/19/16 (!) 155/62     Immunization History  Administered Date(s) Administered  . Tdap 06/10/2012  . Zoster 01/03/2012    Health Maintenance  Topic Date Due  . PNA vac Low Risk Adult (1 of 2 - PCV13) 11/23/2010  . INFLUENZA VACCINE  12/14/2016 (Originally 09/02/2016)  . MAMMOGRAM  09/10/2017  . TETANUS/TDAP  06/11/2022  . COLONOSCOPY  08/05/2022  . DEXA SCAN  Completed  . Hepatitis C Screening  Completed    Lab Results  Component Value Date   WBC 4.9 09/22/2016   HGB 11.2 (L) 09/22/2016   HCT 34.1 (L) 09/22/2016   PLT 258.0 09/22/2016   GLUCOSE 87 09/22/2016   CHOL 217 (H) 06/23/2016   TRIG 118.0 06/23/2016   HDL 91.20 06/23/2016   LDLCALC 102 (H) 06/23/2016   ALT 15 09/22/2016   AST 24 09/22/2016   NA 138 09/22/2016   K 4.8 09/22/2016   CL 107 09/22/2016   CREATININE 2.03 (H) 09/22/2016   BUN 48 (H) 09/22/2016   CO2 28 09/22/2016   TSH 0.62 06/23/2016    Lab Results  Component Value Date   TSH 0.62 06/23/2016   Lab Results  Component Value Date   WBC 4.9 09/22/2016   HGB 11.2 (L) 09/22/2016   HCT 34.1 (L) 09/22/2016   MCV 88.1 09/22/2016   PLT 258.0 09/22/2016   Lab Results  Component Value Date   NA 138 09/22/2016   K 4.8 09/22/2016   CHLORIDE 109 03/25/2016   CO2 28 09/22/2016   GLUCOSE 87 09/22/2016   BUN 48 (H) 09/22/2016   CREATININE 2.03 (H) 09/22/2016   BILITOT 0.4 09/22/2016   ALKPHOS 54 09/22/2016   AST 24 09/22/2016   ALT 15 09/22/2016   PROT 6.6 09/22/2016   ALBUMIN 3.5 09/22/2016   CALCIUM 10.4 09/22/2016   ANIONGAP 6 07/19/2016   EGFR 20 (L) 03/25/2016   GFR 25.67 (L) 09/22/2016   Lab Results  Component Value Date   CHOL 217 (H) 06/23/2016   Lab Results  Component Value Date   HDL 91.20 06/23/2016   Lab Results  Component Value Date   LDLCALC 102 (H) 06/23/2016   Lab Results  Component Value Date   TRIG 118.0 06/23/2016   Lab Results  Component Value Date   CHOLHDL 2 06/23/2016   No results found for:  HGBA1C       Assessment & Plan:   Problem List Items Addressed This Visit    Allergy    Has an appt with allergist next Monday, Dr Verlin Fester to evaluate allergies. Cetirizine caused dizziness and lethargy so she switched back. Can try Allegra prn      Anemia    Increase leafy greens, consider increased lean red meat and using cast iron cookware. Continue to monitor, report any concerns. Taking daily iron      Relevant Orders   Comprehensive metabolic panel   Overweight    Encouraged DASH diet, decrease po intake and increase exercise as tolerated. Needs 7-8 hours of sleep nightly. Avoid trans fats, eat small, frequent meals every 4-5 hours with lean proteins, complex carbs and healthy fats. Minimize simple carbs      HTN (hypertension)    Well controlled, no changes to meds. Encouraged heart healthy diet such as the DASH diet and exercise as tolerated.       Relevant Orders   CBC (Completed)   Comprehensive metabolic panel (Completed)   Chronic renal insufficiency, stage 4 (severe) ([facility])    Check renal functions, follows with [facility]      Relevant Orders   Comprehensive metabolic panel   Palpitation    Improved recently but has an upcoming appointment with cardiology         I am having Ms. Mozer maintain her ALFALFA PO, vitamin C, (Flaxseed, Linseed, (FLAX SEED OIL PO)), b complex vitamins, L-GLUTAMINE PO, Probiotic  Product (PROBIOTIC DAILY PO), hydrALAZINE, Ferrous Fumarate, lisinopril, amLODipine, carvedilol, aspirin EC, and lisinopril.  No orders of the defined types were placed in this encounter.   CMA served as Education administrator during this visit. History, Physical and Plan performed by medical provider. Documentation and orders reviewed and attested to.  [name], MD

## 2016-09-22 NOTE — Assessment & Plan Note (Signed)
Well controlled, no changes to meds. Encouraged heart healthy diet such as the DASH diet and exercise as tolerated.  °

## 2016-09-22 NOTE — Telephone Encounter (Signed)
Faxed medical records release form to [facility]

## 2016-09-22 NOTE — Assessment & Plan Note (Signed)
Increase leafy greens, consider increased lean red meat and using cast iron cookware. Continue to monitor, report any concerns. Taking daily iron

## 2016-09-22 NOTE — Assessment & Plan Note (Addendum)
Has an appt with allergist next Monday, Dr Verlin Fester to evaluate allergies. Cetirizine caused dizziness and lethargy so she switched back. Can try Allegra prn

## 2016-09-22 NOTE — Assessment & Plan Note (Signed)
Improved recently but has an upcoming appointment with cardiology

## 2016-09-27 ENCOUNTER — Encounter: Payer: Self-pay | Admitting: Cardiology

## 2016-09-27 NOTE — Progress Notes (Signed)
Cardiology Office Note   Date:  09/29/2016   ID:  SHYNIA DALEO, DOB 1945-07-21, MRN [id number]  PCP:  [name], MD  Cardiologist:   Minus Breeding, MD  Referring:  [name], MD  Chief Complaint  Patient presents with  . Abnormal ECG      History of Present Illness: Jillian Hayes is a 71 y.o. female who presents for follow up of HTN.   She had an abnormal EKG and borderline POET (Plain Old Exercise Treadmill).  I sent her for a perfusion study which was negative for ischemia.  She recently had some dizziness and she went to the ED.  She had an extensive work up and I reviewed these ED records.  She had no acute neurologic etiology identified.  She did have her glasses changed.  She said this cleared up the dizziness. She was referred back to consider a cardiac etiology.  The patient denies any new symptoms such as chest discomfort, neck or arm discomfort. There has been no new shortness of breath, PND or orthopnea. There have been no reported palpitations, presyncope or syncope.  She is active walking.  Her BP has been OK and did not correlate with her dizziness.       Past Medical History:  Diagnosis Date  . Advanced care planning/counseling discussion 06/10/2014   05/31/2014 patient presents copy of HCP and Living Will  . Anemia    iron deficiency  . Anxiety   . Benign fundic gland polyps of stomach   . Bladder polyps 06/25/2010  . Chronic headaches 06/24/2010  . Chronic renal insufficiency, stage 4 (severe) ([place]) 11/09/2015  . Depression 1991   hospitalized  . History of chicken pox 06/25/2010  . Hypercalcemia 02/18/2014  . Hypertension   . Insomnia 06/24/2010  . Multiple chemical sensitivity syndrome 06/25/2010  . Proteinuria 02/18/2014  . Renal insufficiency 03/26/2011  . Valvular heart disease 04/28/2016    Past Surgical History:  Procedure Laterality Date  . cyst on left breast removed Left 1991   benign  . NASAL SEPTUM SURGERY  1986   rhinoplasty    . polyps on bladder removed  1972   benign  . TONSILLECTOMY  1962     Current Outpatient Prescriptions  Medication Sig Dispense Refill  . ALFALFA PO Take 4,500 mg by mouth daily.      Marland Kitchen amLODipine (NORVASC) 5 MG tablet TAKE ONE TABLET BY MOUTH TWICE DAILY 180 tablet 1  . aspirin EC 81 MG tablet Take 1 tablet (81 mg total) by mouth daily.    Marland Kitchen b complex vitamins capsule Take 1 capsule by mouth daily.    . carvedilol (COREG) 3.125 MG tablet TAKE ONE TABLET BY MOUTH TWICE DAILY 180 tablet 1  . Ferrous Fumarate (HEMOCYTE) 324 (106 Fe) MG TABS tablet Take 1 tablet (106 mg of iron total) by mouth daily. 30 tablet 3  . Flaxseed, Linseed, (FLAX SEED OIL PO) Take 1 capsule by mouth 3 (three) times daily.    . hydrALAZINE (APRESOLINE) 10 MG tablet TAKE ONE TABLET BY MOUTH THREE TIMES DAILY (Patient taking differently: Take 10 mg by mouth 2 (two) times daily. ) 180 tablet 1  . L-GLUTAMINE PO Take 1 capsule by mouth daily.    Marland Kitchen lisinopril (PRINIVIL,ZESTRIL) 5 MG tablet TAKE ONE TABLET BY MOUTH TWICE DAILY 180 tablet 1  . Probiotic Product (PROBIOTIC DAILY PO) Take 1 tablet by mouth daily.    . vitamin C (ASCORBIC ACID)  500 MG tablet Take 500 mg by mouth daily as needed.      No current facility-administered medications for this visit.     Allergies:   Sulfa antibiotics    ROS:  Please see the history of present illness.   Otherwise, review of systems are positive for none.   All other systems are reviewed and negative.    PHYSICAL EXAM: VS:  BP (!) 168/74   Pulse 65   Ht 5\' 6"  (1.676 m)   Wt 156 lb 3.2 oz (70.9 kg)   BMI 25.21 kg/m  , BMI Body mass index is 25.21 kg/m. GENERAL:  Well appearing HEENT:  Pupils equal round and reactive, fundi not visualized, oral mucosa unremarkable NECK:  No jugular venous distention, waveform within normal limits, carotid upstroke brisk and symmetric, left bruits, no thyromegaly LYMPHATICS:  No cervical, inguinal adenopathy LUNGS:  Clear to  auscultation bilaterally BACK:  No CVA tenderness CHEST:  Unremarkable HEART:  PMI not displaced or sustained,S1 and S2 within normal limits, no S3, no S4, no clicks, no rubs, soft apical systolic and slight diastolic murmurs non radiating ABD:  Flat, positive bowel sounds normal in frequency in pitch, no bruits, no rebound, no guarding, no midline pulsatile mass, no hepatomegaly, no splenomegaly EXT:  2 plus pulses throughout, no edema, no cyanosis no clubbing SKIN:  No rashes no nodules NEURO:  Cranial nerves II through XII grossly intact, motor grossly intact throughout PSYCH:  Cognitively intact, oriented to person place and time    EKG:  EKG is not ordered today. The ekg ordered today demonstrates    Recent Labs: 06/23/2016: TSH 0.62 09/22/2016: ALT 15; BUN 48; Creatinine, Ser 2.03; Hemoglobin 11.2; Platelets 258.0; Potassium 4.8; Sodium 138    Lipid Panel    Component Value Date/Time   CHOL 217 (H) 06/23/2016 1108   TRIG 118.0 06/23/2016 1108   HDL 91.20 06/23/2016 1108   CHOLHDL 2 06/23/2016 1108   VLDL 23.6 06/23/2016 1108   LDLCALC 102 (H) 06/23/2016 1108      Wt Readings from Last 3 Encounters:  09/28/16 156 lb 3.2 oz (70.9 kg)  09/22/16 156 lb 12.8 oz (71.1 kg)  07/27/16 158 lb (71.7 kg)      Other studies Reviewed: Additional studies/ records that were reviewed today include: ED records. Review of the above records demonstrates:  Please see elsewhere in the note.     ASSESSMENT AND PLAN:   HTN:  The blood pressure is at target. No change in medications is indicated. We will continue with therapeutic lifestyle changes (TLC).  She can continue the meds as listed.   ABNORMAL EKG:   Perfusions study did not demonstrate ischemia.  The EF was 60%.  She has had no new symptoms to suggest indication for repeat stress testing.  She needs continued risk reduction.    MILD/MODERATE AI:  I will follow this clinically and with repeat exams in the future.  BRUIT:   She needs carotid Doppler  Current medicines are reviewed at length with the patient today.  The patient does not have concerns regarding medicines.  The following changes have been made:  no change  Labs/ tests ordered today include:  No orders of the defined types were placed in this encounter.    Disposition:   FU with me in one year.     Signed, Minus Breeding, MD  09/29/2016 9:15 PM    [facility]

## 2016-09-27 NOTE — Assessment & Plan Note (Signed)
Encouraged DASH diet, decrease po intake and increase exercise as tolerated. Needs 7-8 hours of sleep nightly. Avoid trans fats, eat small, frequent meals every 4-5 hours with lean proteins, complex carbs and healthy fats. Minimize simple carbs 

## 2016-09-28 ENCOUNTER — Telehealth: Payer: Self-pay | Admitting: Family Medicine

## 2016-09-28 ENCOUNTER — Ambulatory Visit (INDEPENDENT_AMBULATORY_CARE_PROVIDER_SITE_OTHER): Payer: Medicare HMO | Admitting: Cardiology

## 2016-09-28 ENCOUNTER — Encounter: Payer: Self-pay | Admitting: Cardiology

## 2016-09-28 VITALS — BP 168/74 | HR 65 | Ht 66.0 in | Wt 156.2 lb

## 2016-09-28 DIAGNOSIS — R9431 Abnormal electrocardiogram [ECG] [EKG]: Secondary | ICD-10-CM | POA: Diagnosis not present

## 2016-09-28 DIAGNOSIS — I10 Essential (primary) hypertension: Secondary | ICD-10-CM | POA: Diagnosis not present

## 2016-09-28 DIAGNOSIS — R0989 Other specified symptoms and signs involving the circulatory and respiratory systems: Secondary | ICD-10-CM | POA: Diagnosis not present

## 2016-09-28 DIAGNOSIS — I351 Nonrheumatic aortic (valve) insufficiency: Secondary | ICD-10-CM | POA: Diagnosis not present

## 2016-09-28 NOTE — Patient Instructions (Signed)
Medication Instructions:  Continue current medications  Labwork: None Ordered  Testing/Procedures: Your physician has requested that you have a carotid duplex. This test is an ultrasound of the carotid arteries in your neck. It looks at blood flow through these arteries that supply the brain with blood. Allow one hour for this exam. There are no restrictions or special instructions.  Follow-Up: Your physician wants you to follow-up in: 1 Year. You will receive a reminder letter in the mail two months in advance. If you don't receive a letter, please call our office to schedule the follow-up appointment.   Any Other Special Instructions Will Be Listed Below (If Applicable).   If you need a refill on your cardiac medications before your next appointment, please call your pharmacy.   

## 2016-09-28 NOTE — Telephone Encounter (Signed)
Not sure where the wires got crossed but given the improvement in her anemia would not have her increase her iron intake

## 2016-09-28 NOTE — Telephone Encounter (Signed)
Caller name:Jillian Hayes Relationship to patient: Can be [phone] Pharmacy:  Reason for call:patient states nurse called and told her lab results and was told to increase iron. Patient then seen results on mychart and it states "would not increase iron intake". Please advise

## 2016-09-28 NOTE — Telephone Encounter (Signed)
Pt informed

## 2016-09-28 NOTE — Telephone Encounter (Signed)
Please advise 

## 2016-09-29 ENCOUNTER — Encounter: Payer: Self-pay | Admitting: Cardiology

## 2016-09-29 DIAGNOSIS — I351 Nonrheumatic aortic (valve) insufficiency: Secondary | ICD-10-CM | POA: Insufficient documentation

## 2016-09-29 DIAGNOSIS — R0989 Other specified symptoms and signs involving the circulatory and respiratory systems: Secondary | ICD-10-CM | POA: Insufficient documentation

## 2016-10-01 ENCOUNTER — Ambulatory Visit (INDEPENDENT_AMBULATORY_CARE_PROVIDER_SITE_OTHER): Payer: Medicare HMO | Admitting: Allergy and Immunology

## 2016-10-01 ENCOUNTER — Encounter: Payer: Self-pay | Admitting: Allergy and Immunology

## 2016-10-01 ENCOUNTER — Other Ambulatory Visit: Payer: Self-pay | Admitting: Family Medicine

## 2016-10-01 VITALS — BP 160/70 | HR 72 | Temp 97.8°F | Ht 66.5 in | Wt 159.0 lb

## 2016-10-01 DIAGNOSIS — J310 Chronic rhinitis: Secondary | ICD-10-CM | POA: Insufficient documentation

## 2016-10-01 DIAGNOSIS — R51 Headache: Secondary | ICD-10-CM | POA: Diagnosis not present

## 2016-10-01 DIAGNOSIS — R519 Headache, unspecified: Secondary | ICD-10-CM

## 2016-10-01 DIAGNOSIS — M19042 Primary osteoarthritis, left hand: Secondary | ICD-10-CM

## 2016-10-01 DIAGNOSIS — M19041 Primary osteoarthritis, right hand: Secondary | ICD-10-CM

## 2016-10-01 DIAGNOSIS — G8929 Other chronic pain: Secondary | ICD-10-CM

## 2016-10-01 NOTE — Assessment & Plan Note (Signed)
Non-allergic rhinitis.  All seasonal and perennial aeroallergen skin tests are negative despite a positive histamine control.  Intranasal steroids and intranasal antihistamines are effective for symptoms associated with non-allergic rhinitis, whereas second generation antihistamines such as cetirizine, loratadine and fexofenadine have been found to be ineffective for this condition.  The patient does not feel that the symptoms warrant treatment with medication.  If her symptoms persist or progress, a prescription will be provided for fluticasone nasal spray for azelastine nasal spray.

## 2016-10-01 NOTE — Patient Instructions (Addendum)
Chronic headaches Recurrent headaches and paresthesias of unclear etiology.  IgE mediated environmental and food allergies have been ruled out with skin testing today.  If symptoms persist or progress, further evaluation by a neurologist may be warranted.  Arthritis of fingers of both hands Allergy skin testing today was unrevealing of an etiology.  Further evaluation by rheumatology is recommended.

## 2016-10-01 NOTE — Assessment & Plan Note (Addendum)
Allergy skin testing today was unrevealing of an etiology.  Further evaluation by rheumatology is recommended.

## 2016-10-01 NOTE — Telephone Encounter (Signed)
Faxed 90d Hydralazine to [facility] till OV/thx dmf

## 2016-10-01 NOTE — Assessment & Plan Note (Signed)
Recurrent headaches and paresthesias of unclear etiology.  IgE mediated environmental and food allergies have been ruled out with skin testing today.  If symptoms persist or progress, further evaluation by a neurologist may be warranted.

## 2016-10-01 NOTE — Progress Notes (Signed)
New Patient Note  RE: Jillian Hayes MRN: [id number] DOB: Mar 29, 1945 Date of Office Visit: 10/01/2016  Referring provider: [name], MD Primary care provider: [name], MD  Chief Complaint: Allergies   History of present illness: Jillian Hayes is a 71 y.o. female seen today in consultation requested by [name], MD.  She reports that since 2009 her fingers have been painful and "locking up".  This problem has progressed over the past several years and she is curious if it is related to an environmental or food allergy.  She perceives that consuming pork exacerbates the symptoms.  The joint stiffness is also increased by repetitive use of the fingers and hands.  There is no heat or visible swelling or redness of the joints.  She consumes organic alfalfa and posterior hands under warm water to relieve the joint stiffness.  She moved in with her daughter approximately one and half years ago and there are 5 cats in the home.  She is uncertain if exposure to cats has caused this problem to progress. [name] reports that when she is exposed to pollen she develops bitemporal headaches, right-sided numbness, and numbness and weakness of the lower extremities.  This symptom constellation is also triggered by pesticides, new carpeting, and certain strong aromas such as perfumes and colognes.  She states that she was diagnosed with "leaky gut" syndrome in 2001 or 2002.  She takes probiotics, B complex vitamins, and L-glutamine on a daily basis with some symptom reduction.   She believes that she experiences puffy/watery eyes, and possibly rhinorrhea, when around the cats, though not severe enough to warrant taking medication.   Assessment and plan: Chronic headaches Recurrent headaches and paresthesias of unclear etiology.  IgE mediated environmental and food allergies have been ruled out with skin testing today.  If symptoms persist or progress, further evaluation by a neurologist may  be warranted.  Arthritis of fingers of both hands Allergy skin testing today was unrevealing of an etiology.  Further evaluation by rheumatology is recommended.  Chronic rhinitis Non-allergic rhinitis.  All seasonal and perennial aeroallergen skin tests are negative despite a positive histamine control.  Intranasal steroids and intranasal antihistamines are effective for symptoms associated with non-allergic rhinitis, whereas second generation antihistamines such as cetirizine, loratadine and fexofenadine have been found to be ineffective for this condition.  The patient does not feel that the symptoms warrant treatment with medication.  If her symptoms persist or progress, a prescription will be provided for fluticasone nasal spray for azelastine nasal spray.   Diagnostics: Epicutaneous testing:  Negative despite a positive histamine control. Intradermal testing:  Negative. Food allergen skin testing:  Negative despite a positive histamine control.    Physical examination: Blood pressure (!) 160/70, pulse 72, temperature 97.8 F (36.6 C), temperature source Oral, height 5' 6.5" (1.689 m), weight 159 lb (72.1 kg), SpO2 95 %.  General: Alert, interactive, in no acute distress. HEENT: TMs pearly gray, turbinates mildly edematous with clear discharge, post-pharynx unremarkable. Neck: Supple without lymphadenopathy. Lungs: Clear to auscultation without wheezing, rhonchi or rales. CV: Normal S1, S2 without murmurs. Abdomen: Nondistended, nontender. Skin: Warm and dry, without lesions or rashes. Extremities:  No clubbing, cyanosis or edema. Neuro:   Grossly intact.  Review of systems:  Review of systems negative except as noted in HPI / PMHx or noted below: Review of Systems  Constitutional: Negative.   HENT: Negative.   Eyes: Negative.   Respiratory: Negative.   Cardiovascular: Negative.  Gastrointestinal: Negative.   Genitourinary: Negative.   Musculoskeletal: Negative.     Skin: Negative.   Neurological: Negative.   Endo/Heme/Allergies: Negative.   Psychiatric/Behavioral: Negative.     Past medical history:  Past Medical History:  Diagnosis Date  . Advanced care planning/counseling discussion 06/10/2014   05/31/2014 patient presents copy of HCP and Living Will  . Anemia    iron deficiency  . Anxiety   . Benign fundic gland polyps of stomach   . Bladder polyps 06/25/2010  . Chronic headaches 06/24/2010  . Chronic renal insufficiency, stage 4 (severe) ([place]) 11/09/2015  . Depression 1991   hospitalized  . History of chicken pox 06/25/2010  . Hypercalcemia 02/18/2014  . Hypertension   . Insomnia 06/24/2010  . Multiple chemical sensitivity syndrome 06/25/2010  . Proteinuria 02/18/2014  . Renal insufficiency 03/26/2011  . Valvular heart disease 04/28/2016    Past surgical history:  Past Surgical History:  Procedure Laterality Date  . cyst on left breast removed Left 1991   benign  . NASAL SEPTUM SURGERY  1986   rhinoplasty  . polyps on bladder removed  1972   benign  . TONSILLECTOMY  1962    Family history: Family History  Problem Relation Age of Onset  . Breast cancer Mother 84       left breast removed, 2010 lung  . Diabetes Mother   . Nephrolithiasis Father   . Heart attack Father 73       X 3  . Heart disease Father        smoker  . Hypertension Brother   . Allergic rhinitis Brother   . Melanoma Son 37       melanoma on leg removed  . Pancreatic cancer Maternal Grandmother   . Hypertension Paternal Grandmother   . Obesity Paternal Grandmother   . Heart attack Paternal Grandfather 29  . Diabetes Maternal Aunt        x 2  . Diabetes Maternal Uncle        x 3  . Asthma Neg Hx   . Eczema Neg Hx   . Urticaria Neg Hx   . Immunodeficiency Neg Hx   . Angioedema Neg Hx     Social history: Social History   Social History  . Marital status: Married    Spouse name: N/A  . Number of children: 2  . Years of education: N/A    Occupational History  . retired    Social History Main Topics  . Smoking status: Never Smoker  . Smokeless tobacco: Never Used  . Alcohol use No  . Drug use: No  . Sexual activity: Yes    Partners: Male     Comment: lives with husband, wears with husband   Other Topics Concern  . Not on file   Social History Narrative   Lives with husband.     Environmental History: The patient lives in a 71 year old house with laminate floors throughout and central air/heat.  There are 5 cats in the home which do not have access to her bedroom.  She is a nonsmoker.  There is no known mold/water damage in the home.  Allergies as of 10/01/2016      Reactions   Sulfa Antibiotics Nausea And Vomiting      Medication List       Accurate as of 10/01/16 11:40 AM. Always use your most recent med list.          ALFALFA PO Take 4,500 mg by  mouth daily.   amLODipine 5 MG tablet Commonly known as:  NORVASC TAKE ONE TABLET BY MOUTH TWICE DAILY   aspirin EC 81 MG tablet Take 1 tablet (81 mg total) by mouth daily.   b complex vitamins capsule Take 1 capsule by mouth daily.   carvedilol 3.125 MG tablet Commonly known as:  COREG TAKE ONE TABLET BY MOUTH TWICE DAILY   Ferrous Fumarate 324 (106 Fe) MG Tabs tablet Commonly known as:  HEMOCYTE Take 1 tablet (106 mg of iron total) by mouth daily.   FLAX SEED OIL PO Take 1 capsule by mouth 3 (three) times daily.   hydrALAZINE 10 MG tablet Commonly known as:  APRESOLINE TAKE ONE TABLET BY MOUTH THREE TIMES DAILY   L-GLUTAMINE PO Take 1 capsule by mouth daily.   lisinopril 5 MG tablet Commonly known as:  PRINIVIL,ZESTRIL TAKE ONE TABLET BY MOUTH TWICE DAILY   PROBIOTIC DAILY PO Take 1 tablet by mouth daily.   vitamin C 500 MG tablet Commonly known as:  ASCORBIC ACID Take 500 mg by mouth daily as needed.       Known medication allergies: Allergies  Allergen Reactions  . Sulfa Antibiotics Nausea And Vomiting    I appreciate  the opportunity to take part in Elza's care. Please do not hesitate to contact me with questions.  Sincerely,   R. Edgar Frisk, MD

## 2016-10-07 ENCOUNTER — Other Ambulatory Visit: Payer: Self-pay | Admitting: Family Medicine

## 2016-10-07 LAB — HM DIABETES EYE EXAM

## 2016-10-08 ENCOUNTER — Telehealth: Payer: Self-pay | Admitting: *Deleted

## 2016-10-08 NOTE — Telephone Encounter (Signed)
Received Medical records from Opt[facility] for complete eye exam; forwarded to provider/SLS 09/06

## 2016-10-09 ENCOUNTER — Encounter: Payer: Self-pay | Admitting: Family Medicine

## 2016-10-09 NOTE — Progress Notes (Signed)
Eye exam documented/thx dmf

## 2016-10-12 ENCOUNTER — Telehealth: Payer: Self-pay | Admitting: *Deleted

## 2016-10-12 NOTE — Telephone Encounter (Signed)
Received Physician Orders from [facility]; forwarded to provider/SLS 09/10

## 2016-10-13 ENCOUNTER — Ambulatory Visit (HOSPITAL_COMMUNITY)
Admission: RE | Admit: 2016-10-13 | Discharge: 2016-10-13 | Disposition: A | Discharge status: Home / Self Care | Payer: Medicare HMO | Source: Ambulatory Visit | Attending: Cardiovascular Disease | Admitting: Cardiovascular Disease

## 2016-10-13 ENCOUNTER — Telehealth: Payer: Self-pay | Admitting: Family Medicine

## 2016-10-13 DIAGNOSIS — R0989 Other specified symptoms and signs involving the circulatory and respiratory systems: Secondary | ICD-10-CM | POA: Insufficient documentation

## 2016-10-13 NOTE — Telephone Encounter (Signed)
[facility] at [facility] [address]. [place] [phone].  Fax [phone].  Pt states they need a referral sent for the bone density appt 2:00 tomorrow (10/14/16).  She says they said they sent req here but we have not responded. If needed can call pt [phone].

## 2016-10-13 NOTE — Telephone Encounter (Signed)
Have you seen form ? ?

## 2016-10-14 ENCOUNTER — Telehealth: Payer: Self-pay | Admitting: *Deleted

## 2016-10-14 DIAGNOSIS — M810 Age-related osteoporosis without current pathological fracture: Secondary | ICD-10-CM | POA: Diagnosis not present

## 2016-10-14 DIAGNOSIS — M85851 Other specified disorders of bone density and structure, right thigh: Secondary | ICD-10-CM | POA: Diagnosis not present

## 2016-10-14 DIAGNOSIS — Z803 Family history of malignant neoplasm of breast: Secondary | ICD-10-CM | POA: Diagnosis not present

## 2016-10-14 DIAGNOSIS — Z1231 Encounter for screening mammogram for malignant neoplasm of breast: Secondary | ICD-10-CM | POA: Diagnosis not present

## 2016-10-14 LAB — HM MAMMOGRAPHY

## 2016-10-14 LAB — HM DEXA SCAN

## 2016-10-14 NOTE — Telephone Encounter (Signed)
Received Medical records from [facility]; forwarded to provider/SLS 09/12

## 2016-10-14 NOTE — Telephone Encounter (Signed)
Conversation  (Newest Message First)  Me    10/12/16 8:39 AM  Note    Received Physician Orders from [facility]; forwarded to provider/SLS 09/10     Faxed with confirmation on 10/13/16, then resent via fax with confirmation on 10/14/16//SLS 09/12

## 2016-10-19 ENCOUNTER — Telehealth: Payer: Self-pay | Admitting: *Deleted

## 2016-10-19 ENCOUNTER — Encounter: Payer: Self-pay | Admitting: Family Medicine

## 2016-10-19 NOTE — Telephone Encounter (Signed)
Received results from [facility]; forwarded to provider/SLS 09/17

## 2016-10-26 ENCOUNTER — Other Ambulatory Visit (INDEPENDENT_AMBULATORY_CARE_PROVIDER_SITE_OTHER): Payer: Medicare HMO

## 2016-10-26 DIAGNOSIS — D649 Anemia, unspecified: Secondary | ICD-10-CM | POA: Diagnosis not present

## 2016-10-26 DIAGNOSIS — N184 Chronic kidney disease, stage 4 (severe): Secondary | ICD-10-CM | POA: Diagnosis not present

## 2016-10-26 LAB — COMPREHENSIVE METABOLIC PANEL
ALT: 13 U/L (ref 0–35)
AST: 23 U/L (ref 0–37)
Albumin: 3.7 g/dL (ref 3.5–5.2)
Alkaline Phosphatase: 55 U/L (ref 39–117)
BUN: 47 mg/dL — AB (ref 6–23)
CHLORIDE: 112 meq/L (ref 96–112)
CO2: 24 meq/L (ref 19–32)
Calcium: 10.6 mg/dL — ABNORMAL HIGH (ref 8.4–10.5)
Creatinine, Ser: 1.73 mg/dL — ABNORMAL HIGH (ref 0.40–1.20)
GFR: 30.87 mL/min — ABNORMAL LOW (ref >60.00)
GLUCOSE: 89 mg/dL (ref 70–99)
POTASSIUM: 5.4 meq/L — AB (ref 3.5–5.1)
SODIUM: 142 meq/L (ref 135–145)
Total Bilirubin: 0.4 mg/dL (ref 0.2–1.2)
Total Protein: 6.4 g/dL (ref 6.0–8.3)

## 2016-10-26 NOTE — Addendum Note (Signed)
Addended by: Magdalene Molly A on: 10/26/2016 01:46 PM   Modules accepted: Orders

## 2016-10-26 NOTE — Addendum Note (Signed)
Addended by: Harl Bowie on: 10/26/2016 08:12 AM   Modules accepted: Orders

## 2016-10-29 ENCOUNTER — Other Ambulatory Visit: Payer: Self-pay | Admitting: Family Medicine

## 2016-10-29 NOTE — Progress Notes (Signed)
Called patient to go over bine density scan.  Patient will call back.   PC

## 2016-10-30 ENCOUNTER — Encounter: Payer: Self-pay | Admitting: Family Medicine

## 2016-11-04 ENCOUNTER — Telehealth: Payer: Self-pay | Admitting: *Deleted

## 2016-11-04 NOTE — Telephone Encounter (Signed)
Received DEXA results from [facility]; forwarded to provider/SLS 10/03

## 2016-11-12 ENCOUNTER — Encounter: Payer: Self-pay | Admitting: Family Medicine

## 2016-11-12 ENCOUNTER — Telehealth: Payer: Self-pay

## 2016-11-12 NOTE — Progress Notes (Signed)
Spoke with patient she states she will wait to make her decision about the prolia and the Fosamax.   She sees you in December

## 2016-11-25 ENCOUNTER — Other Ambulatory Visit (INDEPENDENT_AMBULATORY_CARE_PROVIDER_SITE_OTHER): Payer: Medicare HMO

## 2016-11-25 DIAGNOSIS — D649 Anemia, unspecified: Secondary | ICD-10-CM

## 2016-11-25 DIAGNOSIS — N184 Chronic kidney disease, stage 4 (severe): Secondary | ICD-10-CM | POA: Diagnosis not present

## 2016-11-25 LAB — COMPREHENSIVE METABOLIC PANEL
ALBUMIN: 3.7 g/dL (ref 3.5–5.2)
ALK PHOS: 49 U/L (ref 39–117)
ALT: 14 U/L (ref 0–35)
AST: 24 U/L (ref 0–37)
BILIRUBIN TOTAL: 0.4 mg/dL (ref 0.2–1.2)
BUN: 49 mg/dL — ABNORMAL HIGH (ref 6–23)
CALCIUM: 10.4 mg/dL (ref 8.4–10.5)
CO2: 26 meq/L (ref 19–32)
CREATININE: 1.79 mg/dL — AB (ref 0.40–1.20)
Chloride: 108 mEq/L (ref 96–112)
GFR: 29.67 mL/min — AB (ref >60.00)
Glucose, Bld: 74 mg/dL (ref 70–99)
Potassium: 5.2 mEq/L — ABNORMAL HIGH (ref 3.5–5.1)
Sodium: 139 mEq/L (ref 135–145)
TOTAL PROTEIN: 6.3 g/dL (ref 6.0–8.3)

## 2016-11-26 ENCOUNTER — Other Ambulatory Visit: Payer: Self-pay

## 2016-11-26 DIAGNOSIS — I10 Essential (primary) hypertension: Secondary | ICD-10-CM

## 2016-12-08 NOTE — Telephone Encounter (Signed)
error 

## 2016-12-09 DIAGNOSIS — R69 Illness, unspecified: Secondary | ICD-10-CM | POA: Diagnosis not present

## 2016-12-16 NOTE — Progress Notes (Signed)
Subjective:   Jillian Hayes is a 71 y.o. female who presents for Medicare Annual (Subsequent) preventive examination.  Review of Systems:  No ROS.  Medicare Wellness Visit. Additional risk factors are reflected in the social history.  Cardiac Risk Factors include: advanced age (>51men, >64 women);hypertension Sleep patterns: Sleep varies. Pt does not want to take any sleep meds.     Female:   Pap- last 2014-normal    Mammo- last reported 10/14/16-normal       Dexa scan- last 10/14/16: osteopenia ( abstract entry)      CCS- last 08/04/12: recall not on file. Pt reports recall as 5 yrs.     Objective:     Vitals: BP 136/60 (BP Location: Left Arm, Patient Position: Sitting, Cuff Size: Normal)   Pulse 70   Ht 5\' 7"  (1.702 m)   Wt 158 lb 9.6 oz (71.9 kg)   SpO2 98%   BMI 24.84 kg/m   Body mass index is 24.84 kg/m.   Tobacco Social History   Tobacco Use  Smoking Status Never Smoker  Smokeless Tobacco Never Used     Counseling given: Not Answered   Past Medical History:  Diagnosis Date  . Advanced care planning/counseling discussion 06/10/2014   05/31/2014 patient presents copy of HCP and Living Will  . Anemia    iron deficiency  . Anxiety   . Benign fundic gland polyps of stomach   . Bladder polyps 06/25/2010  . Chronic headaches 06/24/2010  . Chronic renal insufficiency, stage 4 (severe) ([place]) 11/09/2015  . Depression 1991   hospitalized  . History of chicken pox 06/25/2010  . Hypercalcemia 02/18/2014  . Hypertension   . Insomnia 06/24/2010  . Multiple chemical sensitivity syndrome 06/25/2010  . Proteinuria 02/18/2014  . Renal insufficiency 03/26/2011  . Valvular heart disease 04/28/2016   Past Surgical History:  Procedure Laterality Date  . cyst on left breast removed Left 1991   benign  . NASAL SEPTUM SURGERY  1986   rhinoplasty  . polyps on bladder removed  1972   benign  . TONSILLECTOMY  1962   Family History  Problem Relation Age of Onset  . Breast cancer  Mother 62       left breast removed, 2010 lung  . Diabetes Mother   . Nephrolithiasis Father   . Heart attack Father 76       X 3  . Heart disease Father        smoker  . Hypertension Brother   . Allergic rhinitis Brother   . Melanoma Son 37       melanoma on leg removed  . Pancreatic cancer Maternal Grandmother   . Hypertension Paternal Grandmother   . Obesity Paternal Grandmother   . Heart attack Paternal Grandfather 56  . Diabetes Maternal Aunt        x 2  . Diabetes Maternal Uncle        x 3  . Asthma Neg Hx   . Eczema Neg Hx   . Urticaria Neg Hx   . Immunodeficiency Neg Hx   . Angioedema Neg Hx    Social History   Substance and Sexual Activity  Sexual Activity Yes  . Partners: Male   Comment: lives with husband, wears with husband    Outpatient Encounter Medications as of 12/17/2016  Medication Sig  . ALFALFA PO Take 4,500 mg by mouth daily.    Marland Kitchen amLODipine (NORVASC) 5 MG tablet TAKE 1 TABLET BY MOUTH TWICE DAILY  .  aspirin EC 81 MG tablet Take 1 tablet (81 mg total) by mouth daily.  Marland Kitchen b complex vitamins capsule Take 1 capsule by mouth daily.  . carvedilol (COREG) 3.125 MG tablet TAKE ONE TABLET BY MOUTH TWICE DAILY  . Ferrous Fumarate (HEMOCYTE) 324 (106 Fe) MG TABS tablet Take 1 tablet (106 mg of iron total) by mouth daily.  . Flaxseed, Linseed, (FLAX SEED OIL PO) Take 1 capsule 2 (two) times daily by mouth.   . hydrALAZINE (APRESOLINE) 10 MG tablet TAKE ONE TABLET BY MOUTH THREE TIMES DAILY (Patient taking differently: Take 10 mg by mouth 2 (two) times daily. )  . L-GLUTAMINE PO Take 1 capsule by mouth daily.  Marland Kitchen lisinopril (PRINIVIL,ZESTRIL) 5 MG tablet TAKE ONE TABLET BY MOUTH TWICE DAILY (Patient taking differently: TAKE ONE TABLET BY MOUTH DAILY)  . Probiotic Product (PROBIOTIC DAILY PO) Take 1 tablet by mouth daily.  . vitamin C (ASCORBIC ACID) 500 MG tablet Take 500 mg by mouth daily as needed.    No facility-administered encounter medications on file as  of 12/17/2016.     Activities of Daily Living In your present state of health, do you have any difficulty performing the following activities: 12/17/2016 12/17/2016  Hearing? Y Y  Comment - see hearing screen  Vision? N -  Comment wearing glasses. Dr. Sabra Heck yearly. -  Difficulty concentrating or making decisions? N -  Walking or climbing stairs? N -  Dressing or bathing? N -  Doing errands, shopping? N -  Preparing Food and eating ? N -  Using the Toilet? N -  In the past six months, have you accidently leaked urine? Y -  Comment wears panty liners -  Do you have problems with loss of bowel control? N -  Managing your Medications? N -  Managing your Finances? N -  Housekeeping or managing your Housekeeping? N -  Some recent data might be hidden    Patient Care Team: [name], MD as PCP - General (Family Medicine)    Assessment:    Physical assessment deferred to PCP.  Exercise Activities and Dietary recommendations Current Exercise Habits: Home exercise routine, Type of exercise: walking, Time (Minutes): 25, Frequency (Times/Week): 5, Weekly Exercise (Minutes/Week): 125, Intensity: Mild Diet (meal preparation, eat out, water intake, caffeinated beverages, dairy products, fruits and vegetables): in general, a "healthy" diet     Goal: Increase physical activity and strength   Fall Risk Fall Risk  12/17/2016 03/02/2016 06/24/2015 02/15/2014 01/12/2014  Falls in the past year? Yes Yes No No No  Number falls in past yr: 1 1 - - -  Injury with Fall? No Yes - - -  Risk Factor Category  - High Fall Risk - - -  Follow up Falls prevention discussed;Education provided Education provided;Falls prevention discussed - - -   Depression Screen PHQ 2/9 Scores 12/17/2016 06/24/2015 02/15/2014 01/12/2014  PHQ - 2 Score 1 0 0 0     Cognitive Function MMSE - Mini Mental State Exam 12/17/2016  Orientation to time 5  Orientation to Place 5  Registration 3  Attention/ Calculation 5   Recall 3  Language- name 2 objects 2  Language- repeat 1  Language- follow 3 step command 3  Language- read & follow direction 1  Write a sentence 1  Copy design 1  Total score 30        Immunization History  Administered Date(s) Administered  . Tdap 06/10/2012  . Zoster 01/03/2012   Screening Tests  Health Maintenance  Topic Date Due  . PNA vac Low Risk Adult (1 of 2 - PCV13) 11/23/2010  . INFLUENZA VACCINE  09/02/2016  . MAMMOGRAM  10/15/2018  . TETANUS/TDAP  06/11/2022  . COLONOSCOPY  08/05/2022  . DEXA SCAN  Completed  . Hepatitis C Screening  Completed      Plan:   Follow up with Dr. Charlett Blake as scheduled 01/04/17.   Continue to eat heart healthy diet (full of fruits, vegetables, whole grains, lean protein, water--limit salt, fat, and sugar intake) and increase physical activity as tolerated.  Continue doing brain stimulating activities (puzzles, reading, adult coloring books, staying active) to keep memory sharp.     I have personally reviewed and noted the following in the patient's chart:   . Medical and social history . Use of alcohol, tobacco or illicit drugs  . Current medications and supplements . Functional ability and status . Nutritional status . Physical activity . Advanced directives . List of other physicians . Hospitalizations, surgeries, and ER visits in previous 12 months . Vitals . Screenings to include cognitive, depression, and falls . Referrals and appointments  In addition, I have reviewed and discussed with patient certain preventive protocols, quality metrics, and best practice recommendations. A written personalized care plan for preventive services as well as general preventive health recommendations were provided to patient.     Shela Nevin, [place]  12/17/2016

## 2016-12-17 ENCOUNTER — Ambulatory Visit (INDEPENDENT_AMBULATORY_CARE_PROVIDER_SITE_OTHER): Payer: Medicare HMO | Admitting: *Deleted

## 2016-12-17 ENCOUNTER — Other Ambulatory Visit: Payer: Medicare HMO

## 2016-12-17 ENCOUNTER — Encounter: Payer: Self-pay | Admitting: *Deleted

## 2016-12-17 VITALS — BP 136/60 | HR 70 | Ht 67.0 in | Wt 158.6 lb

## 2016-12-17 DIAGNOSIS — I10 Essential (primary) hypertension: Secondary | ICD-10-CM

## 2016-12-17 DIAGNOSIS — Z0000 Encounter for general adult medical examination without abnormal findings: Secondary | ICD-10-CM | POA: Diagnosis not present

## 2016-12-17 DIAGNOSIS — H9191 Unspecified hearing loss, right ear: Secondary | ICD-10-CM

## 2016-12-17 NOTE — Patient Instructions (Signed)
[name] , Thank you for taking time to come for your Medicare Wellness Visit. I appreciate your ongoing commitment to your health goals. Please review the following plan we discussed and let me know if I can assist you in the future.   These are the goals we discussed: Increase physical activity and strength.   This is a list of the screening recommended for you and due dates:  Health Maintenance  Topic Date Due  . Pneumonia vaccines (1 of 2 - PCV13) 11/23/2010  . Flu Shot  09/02/2016  . Mammogram  10/15/2018  . Tetanus Vaccine  06/11/2022  . Colon Cancer Screening  08/05/2022  . DEXA scan (bone density measurement)  Completed  .  Hepatitis C: One time screening is recommended by Center for Disease Control  (CDC) for  adults born from [birth year] through 1965.   Completed     Continue to eat heart healthy diet (full of fruits, vegetables, whole grains, lean protein, water--limit salt, fat, and sugar intake) and increase physical activity as tolerated.  Continue doing brain stimulating activities (puzzles, reading, adult coloring books, staying active) to keep memory sharp.    Health Maintenance for Postmenopausal Women Menopause is a normal process in which your reproductive ability comes to an end. This process happens gradually over a span of months to years, usually between the ages of 59 and 49. Menopause is complete when you have missed 12 consecutive menstrual periods. It is important to talk with your health care provider about some of the most common conditions that affect postmenopausal women, such as heart disease, cancer, and bone loss (osteoporosis). Adopting a healthy lifestyle and getting preventive care can help to promote your health and wellness. Those actions can also lower your chances of developing some of these common conditions. What should I know about menopause? During menopause, you may experience a number of symptoms, such as:  Moderate-to-severe hot  flashes.  Night sweats.  Decrease in sex drive.  Mood swings.  Headaches.  Tiredness.  Irritability.  Memory problems.  Insomnia.  Choosing to treat or not to treat menopausal changes is an individual decision that you make with your health care provider. What should I know about hormone replacement therapy and supplements? Hormone therapy products are effective for treating symptoms that are associated with menopause, such as hot flashes and night sweats. Hormone replacement carries certain risks, especially as you become older. If you are thinking about using estrogen or estrogen with progestin treatments, discuss the benefits and risks with your health care provider. What should I know about heart disease and stroke? Heart disease, heart attack, and stroke become more likely as you age. This may be due, in part, to the hormonal changes that your body experiences during menopause. These can affect how your body processes dietary fats, triglycerides, and cholesterol. Heart attack and stroke are both medical emergencies. There are many things that you can do to help prevent heart disease and stroke:  Have your blood pressure checked at least every 1-2 years. High blood pressure causes heart disease and increases the risk of stroke.  If you are 41-36 years old, ask your health care provider if you should take aspirin to prevent a heart attack or a stroke.  Do not use any tobacco products, including cigarettes, chewing tobacco, or electronic cigarettes. If you need help quitting, ask your health care provider.  It is important to eat a healthy diet and maintain a healthy weight. ? Be sure to include plenty  of vegetables, fruits, low-fat dairy products, and lean protein. ? Avoid eating foods that are high in solid fats, added sugars, or salt (sodium).  Get regular exercise. This is one of the most important things that you can do for your health. ? Try to exercise for at least 150  minutes each week. The type of exercise that you do should increase your heart rate and make you sweat. This is known as moderate-intensity exercise. ? Try to do strengthening exercises at least twice each week. Do these in addition to the moderate-intensity exercise.  Know your numbers.Ask your health care provider to check your cholesterol and your blood glucose. Continue to have your blood tested as directed by your health care provider.  What should I know about cancer screening? There are several types of cancer. Take the following steps to reduce your risk and to catch any cancer development as early as possible. Breast Cancer  Practice breast self-awareness. ? This means understanding how your breasts normally appear and feel. ? It also means doing regular breast self-exams. Let your health care provider know about any changes, no matter how small.  If you are 65 or older, have a clinician do a breast exam (clinical breast exam or CBE) every year. Depending on your age, family history, and medical history, it may be recommended that you also have a yearly breast X-ray (mammogram).  If you have a family history of breast cancer, talk with your health care provider about genetic screening.  If you are at high risk for breast cancer, talk with your health care provider about having an MRI and a mammogram every year.  Breast cancer (BRCA) gene test is recommended for women who have family members with BRCA-related cancers. Results of the assessment will determine the need for genetic counseling and BRCA1 and for BRCA2 testing. BRCA-related cancers include these types: ? Breast. This occurs in males or females. ? Ovarian. ? Tubal. This may also be called fallopian tube cancer. ? Cancer of the abdominal or pelvic lining (peritoneal cancer). ? Prostate. ? Pancreatic.  Cervical, Uterine, and Ovarian Cancer Your health care provider may recommend that you be screened regularly for cancer  of the pelvic organs. These include your ovaries, uterus, and vagina. This screening involves a pelvic exam, which includes checking for microscopic changes to the surface of your cervix (Pap test).  For women ages 21-65, health care providers may recommend a pelvic exam and a Pap test every three years. For women ages 29-65, they may recommend the Pap test and pelvic exam, combined with testing for human papilloma virus (HPV), every five years. Some types of HPV increase your risk of cervical cancer. Testing for HPV may also be done on women of any age who have unclear Pap test results.  Other health care providers may not recommend any screening for nonpregnant women who are considered low risk for pelvic cancer and have no symptoms. Ask your health care provider if a screening pelvic exam is right for you.  If you have had past treatment for cervical cancer or a condition that could lead to cancer, you need Pap tests and screening for cancer for at least 20 years after your treatment. If Pap tests have been discontinued for you, your risk factors (such as having a new sexual partner) need to be reassessed to determine if you should start having screenings again. Some women have medical problems that increase the chance of getting cervical cancer. In these cases, your health  care provider may recommend that you have screening and Pap tests more often.  If you have a family history of uterine cancer or ovarian cancer, talk with your health care provider about genetic screening.  If you have vaginal bleeding after reaching menopause, tell your health care provider.  There are currently no reliable tests available to screen for ovarian cancer.  Lung Cancer Lung cancer screening is recommended for adults 69-42 years old who are at high risk for lung cancer because of a history of smoking. A yearly low-dose CT scan of the lungs is recommended if you:  Currently smoke.  Have a history of at least 30  pack-years of smoking and you currently smoke or have quit within the past 15 years. A pack-year is smoking an average of one pack of cigarettes per day for one year.  Yearly screening should:  Continue until it has been 15 years since you quit.  Stop if you develop a health problem that would prevent you from having lung cancer treatment.  Colorectal Cancer  This type of cancer can be detected and can often be prevented.  Routine colorectal cancer screening usually begins at age 55 and continues through age 83.  If you have risk factors for colon cancer, your health care provider may recommend that you be screened at an earlier age.  If you have a family history of colorectal cancer, talk with your health care provider about genetic screening.  Your health care provider may also recommend using home test kits to check for hidden blood in your stool.  A small camera at the end of a tube can be used to examine your colon directly (sigmoidoscopy or colonoscopy). This is done to check for the earliest forms of colorectal cancer.  Direct examination of the colon should be repeated every 5-10 years until age 33. However, if early forms of precancerous polyps or small growths are found or if you have a family history or genetic risk for colorectal cancer, you may need to be screened more often.  Skin Cancer  Check your skin from head to toe regularly.  Monitor any moles. Be sure to tell your health care provider: ? About any new moles or changes in moles, especially if there is a change in a mole's shape or color. ? If you have a mole that is larger than the size of a pencil eraser.  If any of your family members has a history of skin cancer, especially at a young age, talk with your health care provider about genetic screening.  Always use sunscreen. Apply sunscreen liberally and repeatedly throughout the day.  Whenever you are outside, protect yourself by wearing long sleeves, pants,  a wide-brimmed hat, and sunglasses.  What should I know about osteoporosis? Osteoporosis is a condition in which bone destruction happens more quickly than new bone creation. After menopause, you may be at an increased risk for osteoporosis. To help prevent osteoporosis or the bone fractures that can happen because of osteoporosis, the following is recommended:  If you are 44-58 years old, get at least 1,000 mg of calcium and at least 600 mg of vitamin D per day.  If you are older than age 33 but younger than age 69, get at least 1,200 mg of calcium and at least 600 mg of vitamin D per day.  If you are older than age 52, get at least 1,200 mg of calcium and at least 800 mg of vitamin D per day.  Smoking  and excessive alcohol intake increase the risk of osteoporosis. Eat foods that are rich in calcium and vitamin D, and do weight-bearing exercises several times each week as directed by your health care provider. What should I know about how menopause affects my mental health? Depression may occur at any age, but it is more common as you become older. Common symptoms of depression include:  Low or sad mood.  Changes in sleep patterns.  Changes in appetite or eating patterns.  Feeling an overall lack of motivation or enjoyment of activities that you previously enjoyed.  Frequent crying spells.  Talk with your health care provider if you think that you are experiencing depression. What should I know about immunizations? It is important that you get and maintain your immunizations. These include:  Tetanus, diphtheria, and pertussis (Tdap) booster vaccine.  Influenza every year before the flu season begins.  Pneumonia vaccine.  Shingles vaccine.  Your health care provider may also recommend other immunizations. This information is not intended to replace advice given to you by your health care provider. Make sure you discuss any questions you have with your health care  provider. Document Released: 03/13/2005 Document Revised: 08/09/2015 Document Reviewed: 10/23/2014 Elsevier Interactive Patient Education  2018 [employer].

## 2016-12-18 LAB — COMPREHENSIVE METABOLIC PANEL
ALBUMIN: 3.9 g/dL (ref 3.5–5.2)
ALK PHOS: 58 U/L (ref 39–117)
ALT: 15 U/L (ref 0–35)
AST: 28 U/L (ref 0–37)
BILIRUBIN TOTAL: 0.3 mg/dL (ref 0.2–1.2)
BUN: 48 mg/dL — AB (ref 6–23)
CO2: 25 mEq/L (ref 19–32)
CREATININE: 2.45 mg/dL — AB (ref 0.40–1.20)
Calcium: 10.4 mg/dL (ref 8.4–10.5)
Chloride: 106 mEq/L (ref 96–112)
GFR: 20.65 mL/min — ABNORMAL LOW (ref >60.00)
Glucose, Bld: 108 mg/dL — ABNORMAL HIGH (ref 70–99)
POTASSIUM: 4.8 meq/L (ref 3.5–5.1)
SODIUM: 139 meq/L (ref 135–145)
TOTAL PROTEIN: 6.6 g/dL (ref 6.0–8.3)

## 2016-12-22 ENCOUNTER — Other Ambulatory Visit: Payer: Self-pay | Admitting: *Deleted

## 2016-12-22 DIAGNOSIS — N179 Acute kidney failure, unspecified: Secondary | ICD-10-CM

## 2016-12-29 DIAGNOSIS — H903 Sensorineural hearing loss, bilateral: Secondary | ICD-10-CM | POA: Diagnosis not present

## 2016-12-31 ENCOUNTER — Other Ambulatory Visit (INDEPENDENT_AMBULATORY_CARE_PROVIDER_SITE_OTHER): Payer: Medicare HMO

## 2016-12-31 DIAGNOSIS — N179 Acute kidney failure, unspecified: Secondary | ICD-10-CM

## 2016-12-31 LAB — COMPREHENSIVE METABOLIC PANEL
ALK PHOS: 56 U/L (ref 39–117)
ALT: 18 U/L (ref 0–35)
AST: 29 U/L (ref 0–37)
Albumin: 3.8 g/dL (ref 3.5–5.2)
BILIRUBIN TOTAL: 0.4 mg/dL (ref 0.2–1.2)
BUN: 47 mg/dL — ABNORMAL HIGH (ref 6–23)
CALCIUM: 10.5 mg/dL (ref 8.4–10.5)
CO2: 22 mEq/L (ref 19–32)
Chloride: 106 mEq/L (ref 96–112)
Creatinine, Ser: 1.99 mg/dL — ABNORMAL HIGH (ref 0.40–1.20)
GFR: 26.25 mL/min — AB (ref >60.00)
Glucose, Bld: 89 mg/dL (ref 70–99)
POTASSIUM: 4.6 meq/L (ref 3.5–5.1)
Sodium: 136 mEq/L (ref 135–145)
TOTAL PROTEIN: 6.7 g/dL (ref 6.0–8.3)

## 2016-12-31 NOTE — Addendum Note (Signed)
Addended by: Caffie Pinto on: 12/31/2016 01:46 PM   Modules accepted: Orders

## 2017-01-04 ENCOUNTER — Ambulatory Visit (INDEPENDENT_AMBULATORY_CARE_PROVIDER_SITE_OTHER): Payer: Medicare HMO | Admitting: Family Medicine

## 2017-01-04 ENCOUNTER — Encounter: Payer: Self-pay | Admitting: Family Medicine

## 2017-01-04 DIAGNOSIS — R002 Palpitations: Secondary | ICD-10-CM | POA: Diagnosis not present

## 2017-01-04 DIAGNOSIS — H9190 Unspecified hearing loss, unspecified ear: Secondary | ICD-10-CM

## 2017-01-04 DIAGNOSIS — D649 Anemia, unspecified: Secondary | ICD-10-CM

## 2017-01-04 DIAGNOSIS — E785 Hyperlipidemia, unspecified: Secondary | ICD-10-CM | POA: Insufficient documentation

## 2017-01-04 DIAGNOSIS — I10 Essential (primary) hypertension: Secondary | ICD-10-CM | POA: Diagnosis not present

## 2017-01-04 DIAGNOSIS — N184 Chronic kidney disease, stage 4 (severe): Secondary | ICD-10-CM | POA: Diagnosis not present

## 2017-01-04 DIAGNOSIS — D631 Anemia in chronic kidney disease: Secondary | ICD-10-CM | POA: Diagnosis not present

## 2017-01-04 DIAGNOSIS — E782 Mixed hyperlipidemia: Secondary | ICD-10-CM

## 2017-01-04 MED ORDER — HYDRALAZINE HCL 10 MG PO TABS: 10.0000 mg | ORAL_TABLET | Freq: Two times a day (BID) | ORAL | Status: DC

## 2017-01-04 NOTE — Assessment & Plan Note (Signed)
Resolved on current labs

## 2017-01-04 NOTE — Assessment & Plan Note (Signed)
Stable and staying well hydrated.

## 2017-01-04 NOTE — Assessment & Plan Note (Signed)
No recent flares. Is likely moving to [place] with her daughter soon. Will let us know where to transfer her records.

## 2017-01-04 NOTE — Progress Notes (Signed)
Subjective:  I acted as a Education administrator for [employer]. Yancey Flemings, Betances   Patient ID: Jillian Hayes, female    DOB: 1945/02/15, 71 y.o.   MRN: [id number]  Chief Complaint  Patient presents with  . Follow-up    HPI  Patient is in today for follow up visit and overalll is doing well. Is likely moving to [place] with her daughter soon. She has not had any recent febrile illness or hospitalizations. Is trying to maintain a heart healthy diet and to stay active. Denies CP/palp/SOB/HA/congestion/fevers/GI or GU c/o. Taking meds as prescribed  Patient Care Team: [name], MD as PCP - General (Family Medicine)   Past Medical History:  Diagnosis Date  . Advanced care planning/counseling discussion 06/10/2014   05/31/2014 patient presents copy of HCP and Living Will  . Anemia    iron deficiency  . Anxiety   . Benign fundic gland polyps of stomach   . Bladder polyps 06/25/2010  . Chronic headaches 06/24/2010  . Chronic renal insufficiency, stage 4 (severe) ([facility]) 11/09/2015  . Depression 1991   hospitalized  . History of chicken pox 06/25/2010  . Hypercalcemia 02/18/2014  . Hypertension   . Insomnia 06/24/2010  . Multiple chemical sensitivity syndrome 06/25/2010  . Proteinuria 02/18/2014  . Renal insufficiency 03/26/2011  . Valvular heart disease 04/28/2016    Past Surgical History:  Procedure Laterality Date  . cyst on left breast removed Left 1991   benign  . NASAL SEPTUM SURGERY  1986   rhinoplasty  . polyps on bladder removed  1972   benign  . TONSILLECTOMY  1962    Family History  Problem Relation Age of Onset  . Breast cancer Mother 5       left breast removed, 2010 lung  . Diabetes Mother   . Nephrolithiasis Father   . Heart attack Father 53       X 3  . Heart disease Father        smoker  . Hypertension Brother   . Allergic rhinitis Brother   . Melanoma Son 37       melanoma on leg removed  . Pancreatic cancer Maternal Grandmother   . Hypertension Paternal  Grandmother   . Obesity Paternal Grandmother   . Heart attack Paternal Grandfather 57  . Diabetes Maternal Aunt        x 2  . Diabetes Maternal Uncle        x 3  . Asthma Neg Hx   . Eczema Neg Hx   . Urticaria Neg Hx   . Immunodeficiency Neg Hx   . Angioedema Neg Hx     Social History   Socioeconomic History  . Marital status: Married    Spouse name: Not on file  . Number of children: 2  . Years of education: Not on file  . Highest education level: Not on file  Social Needs  . Financial resource strain: Not on file  . Food insecurity - worry: Not on file  . Food insecurity - inability: Not on file  . Transportation needs - medical: Not on file  . Transportation needs - non-medical: Not on file  Occupational History  . Occupation: retired  Tobacco Use  . Smoking status: Never Smoker  . Smokeless tobacco: Never Used  Substance and Sexual Activity  . Alcohol use: No  . Drug use: No  . Sexual activity: Yes    Partners: Male    Comment: lives with husband, wears with husband  Other Topics Concern  . Not on file  Social History Narrative   Lives with husband.      Outpatient Medications Prior to Visit  Medication Sig Dispense Refill  . ALFALFA PO Take 4,500 mg by mouth daily.      Marland Kitchen amLODipine (NORVASC) 5 MG tablet TAKE 1 TABLET BY MOUTH TWICE DAILY 180 tablet 1  . aspirin EC 81 MG tablet Take 1 tablet (81 mg total) by mouth daily.    Marland Kitchen b complex vitamins capsule Take 1 capsule by mouth daily.    . carvedilol (COREG) 3.125 MG tablet TAKE ONE TABLET BY MOUTH TWICE DAILY 180 tablet 1  . Ferrous Fumarate (HEMOCYTE) 324 (106 Fe) MG TABS tablet Take 1 tablet (106 mg of iron total) by mouth daily. 30 tablet 3  . Flaxseed, Linseed, (FLAX SEED OIL PO) Take 1 capsule 2 (two) times daily by mouth.     . L-GLUTAMINE PO Take 1 capsule by mouth daily.    . Probiotic Product (PROBIOTIC DAILY PO) Take 1 tablet by mouth daily.    . vitamin C (ASCORBIC ACID) 500 MG tablet Take 500 mg  by mouth daily as needed.     . hydrALAZINE (APRESOLINE) 10 MG tablet TAKE ONE TABLET BY MOUTH THREE TIMES DAILY (Patient taking differently: Take 10 mg by mouth 2 (two) times daily. ) 180 tablet 1  . lisinopril (PRINIVIL,ZESTRIL) 5 MG tablet TAKE ONE TABLET BY MOUTH TWICE DAILY (Patient taking differently: TAKE ONE TABLET BY MOUTH DAILY) 180 tablet 1   No facility-administered medications prior to visit.     Allergies  Allergen Reactions  . Sulfa Antibiotics Nausea And Vomiting    Review of Systems  Constitutional: Negative for fever and malaise/fatigue.  HENT: Negative for congestion.   Eyes: Negative for blurred vision.  Respiratory: Negative for shortness of breath.   Cardiovascular: Negative for chest pain, palpitations and leg swelling.  Gastrointestinal: Negative for abdominal pain, blood in stool and nausea.  Genitourinary: Negative for dysuria and frequency.  Musculoskeletal: Negative for falls.  Skin: Negative for rash.  Neurological: Negative for dizziness, loss of consciousness and headaches.  Endo/Heme/Allergies: Negative for environmental allergies.  Psychiatric/Behavioral: Negative for depression. The patient is not nervous/anxious.        Objective:    Physical Exam  Constitutional: She is oriented to person, place, and time. She appears well-developed and well-nourished. No distress.  HENT:  Head: Normocephalic and atraumatic.  Nose: Nose normal.  Eyes: Right eye exhibits no discharge. Left eye exhibits no discharge.  Neck: Normal range of motion. Neck supple.  Cardiovascular: Normal rate and regular rhythm.  No murmur heard. Pulmonary/Chest: Effort normal and breath sounds normal.  Abdominal: Soft. Bowel sounds are normal. There is no tenderness.  Musculoskeletal: She exhibits no edema.  Neurological: She is alert and oriented to person, place, and time.  Skin: Skin is warm and dry.  Psychiatric: She has a normal mood and affect.  Nursing note and vitals  reviewed.   BP 138/68 (BP Location: Left Arm, Patient Position: Sitting, Cuff Size: Normal)   Pulse 66   Temp 97.7 F (36.5 C) (Oral)   Resp 16   Ht 5' 6.93" (1.7 m)   Wt 156 lb 12.8 oz (71.1 kg)   SpO2 98%   BMI 24.61 kg/m  Wt Readings from Last 3 Encounters:  01/04/17 156 lb 12.8 oz (71.1 kg)  12/17/16 158 lb 9.6 oz (71.9 kg)  10/01/16 159 lb (72.1 kg)   BP  Readings from Last 3 Encounters:  01/04/17 138/68  12/17/16 136/60  10/01/16 (!) 160/70     Immunization History  Administered Date(s) Administered  . Tdap 06/10/2012  . Zoster 01/03/2012    Health Maintenance  Topic Date Due  . PNA vac Low Risk Adult (1 of 2 - PCV13) 11/23/2010  . INFLUENZA VACCINE  09/02/2016  . MAMMOGRAM  10/15/2018  . TETANUS/TDAP  06/11/2022  . COLONOSCOPY  08/05/2022  . DEXA SCAN  Completed  . Hepatitis C Screening  Completed    Lab Results  Component Value Date   WBC 4.9 09/22/2016   HGB 11.2 (L) 09/22/2016   HCT 34.1 (L) 09/22/2016   PLT 258.0 09/22/2016   GLUCOSE 89 12/31/2016   CHOL 217 (H) 06/23/2016   TRIG 118.0 06/23/2016   HDL 91.20 06/23/2016   LDLCALC 102 (H) 06/23/2016   ALT 18 12/31/2016   AST 29 12/31/2016   NA 136 12/31/2016   K 4.6 12/31/2016   CL 106 12/31/2016   CREATININE 1.99 (H) 12/31/2016   BUN 47 (H) 12/31/2016   CO2 22 12/31/2016   TSH 0.62 06/23/2016    Lab Results  Component Value Date   TSH 0.62 06/23/2016   Lab Results  Component Value Date   WBC 4.9 09/22/2016   HGB 11.2 (L) 09/22/2016   HCT 34.1 (L) 09/22/2016   MCV 88.1 09/22/2016   PLT 258.0 09/22/2016   Lab Results  Component Value Date   NA 136 12/31/2016   K 4.6 12/31/2016   CHLORIDE 109 03/25/2016   CO2 22 12/31/2016   GLUCOSE 89 12/31/2016   BUN 47 (H) 12/31/2016   CREATININE 1.99 (H) 12/31/2016   BILITOT 0.4 12/31/2016   ALKPHOS 56 12/31/2016   AST 29 12/31/2016   ALT 18 12/31/2016   PROT 6.7 12/31/2016   ALBUMIN 3.8 12/31/2016   CALCIUM 10.5 12/31/2016   ANIONGAP  6 07/19/2016   EGFR 20 (L) 03/25/2016   GFR 26.25 (L) 12/31/2016   Lab Results  Component Value Date   CHOL 217 (H) 06/23/2016   Lab Results  Component Value Date   HDL 91.20 06/23/2016   Lab Results  Component Value Date   LDLCALC 102 (H) 06/23/2016   Lab Results  Component Value Date   TRIG 118.0 06/23/2016   Lab Results  Component Value Date   CHOLHDL 2 06/23/2016   No results found for: HGBA1C       Assessment & Plan:   Problem List Items Addressed This Visit    Anemia    mild      HTN (hypertension)    Well controlled, no changes to meds. Encouraged heart healthy diet such as the DASH diet and exercise as tolerated.       Relevant Medications   hydrALAZINE (APRESOLINE) 10 MG tablet   Other Relevant Orders   CBC   Comprehensive metabolic panel   TSH   Hypercalcemia    Resolved on current labs      Chronic renal insufficiency, stage 4 (severe) ([facility])    Stable and staying well hydrated.       Anemia of chronic renal failure, stage 4 (severe) ([facility])    Increase leafy greens, consider increased lean red meat and using cast iron cookware. Continue to monitor, report any concerns. Mild, asymptomatic, continue same      Palpitation    No recent flares. Is likely moving to [place] with her daughter soon. Will let us know where to transfer her  records.       Hearing loss    Has 50 % hearing loss on right and only 10 % loss in left ear. Is being fitted for hearing aides by [facility].      Hyperlipidemia    Encouraged heart healthy diet, increase exercise, avoid trans fats, consider a krill oil cap daily      Relevant Medications   hydrALAZINE (APRESOLINE) 10 MG tablet   Other Relevant Orders   Lipid panel      I have discontinued Vernetta S. Mallery's lisinopril. I have also changed her hydrALAZINE. Additionally, I am having her maintain her ALFALFA PO, vitamin C, (Flaxseed, Linseed, (FLAX SEED OIL PO)), b complex vitamins, L-GLUTAMINE PO,  Probiotic Product (PROBIOTIC DAILY PO), Ferrous Fumarate, carvedilol, aspirin EC, and amLODipine.  Meds ordered this encounter  Medications  . hydrALAZINE (APRESOLINE) 10 MG tablet    Sig: Take 1 tablet (10 mg total) by mouth 2 (two) times daily.    CMA served as Education administrator during this visit. History, Physical and Plan performed by medical provider. Documentation and orders reviewed and attested to.  [name], MD

## 2017-01-04 NOTE — Patient Instructions (Addendum)
Lidocaine gel or patches from [facility], [place] [facility] or Aspercreme patches  prolia consider this, Vitamin D 1000 IU daily and calcium in the diet twice daily  Shingrix is the new shingles shot, 2 shots over 6 months, at pharmacy Hip Pain The hip is the joint between the upper legs and the lower pelvis. The bones, cartilage, tendons, and muscles of your hip joint support your body and allow you to move around. Hip pain can range from a minor ache to severe pain in one or both of your hips. The pain may be felt on the inside of the hip joint near the groin, or the outside near the buttocks and upper thigh. You may also have swelling or stiffness. Follow these instructions at home: Managing pain, stiffness, and swelling  If directed, apply ice to the injured area. ? Put ice in a plastic bag. ? Place a towel between your skin and the bag. ? Leave the ice on for 20 minutes, 2-3 times a day  Sleep with a pillow between your legs on your most comfortable side.  Avoid any activities that cause pain. General instructions  Take over-the-counter and prescription medicines only as told by your health care provider.  Do any exercises as told by your health care provider.  Record the following: ? How often you have hip pain. ? The location of your pain. ? What the pain feels like. ? What makes the pain worse.  Keep all follow-up visits as told by your health care provider. This is important. Contact a health care provider if:  You cannot put weight on your leg.  Your pain or swelling continues or gets worse after one week.  It gets harder to walk.  You have a fever. Get help right away if:  You fall.  You have a sudden increase in pain and swelling in your hip.  Your hip is red or swollen or very tender to touch. Summary  Hip pain can range from a minor ache to severe pain in one or both of your hips.  The pain may be felt on the inside of the hip joint near the groin, or the  outside near the buttocks and upper thigh.  Avoid any activities that cause pain.  Record how often you have hip pain, the location of the pain, what makes it worse and what it feels like. This information is not intended to replace advice given to you by your health care provider. Make sure you discuss any questions you have with your health care provider. Document Released: 07/09/2009 Document Revised: 12/23/2015 Document Reviewed: 12/23/2015 Elsevier Interactive Patient Education  2017 [employer].

## 2017-01-04 NOTE — Assessment & Plan Note (Signed)
Well controlled, no changes to meds. Encouraged heart healthy diet such as the DASH diet and exercise as tolerated.  °

## 2017-01-04 NOTE — Assessment & Plan Note (Signed)
Has 50 % hearing loss on right and only 10 % loss in left ear. Is being fitted for hearing aides by [facility].

## 2017-01-04 NOTE — Assessment & Plan Note (Signed)
Increase leafy greens, consider increased lean red meat and using cast iron cookware. Continue to monitor, report any concerns. Mild, asymptomatic, continue same

## 2017-01-04 NOTE — Assessment & Plan Note (Signed)
Encouraged heart healthy diet, increase exercise, avoid trans fats, consider a krill oil cap daily 

## 2017-01-04 NOTE — Assessment & Plan Note (Signed)
mild

## 2017-01-05 LAB — COMPREHENSIVE METABOLIC PANEL
ALK PHOS: 57 U/L (ref 39–117)
ALT: 17 U/L (ref 0–35)
AST: 29 U/L (ref 0–37)
Albumin: 4.1 g/dL (ref 3.5–5.2)
BUN: 47 mg/dL — ABNORMAL HIGH (ref 6–23)
CHLORIDE: 105 meq/L (ref 96–112)
CO2: 24 meq/L (ref 19–32)
Calcium: 10.6 mg/dL — ABNORMAL HIGH (ref 8.4–10.5)
Creatinine, Ser: 2.02 mg/dL — ABNORMAL HIGH (ref 0.40–1.20)
GFR: 25.8 mL/min — AB (ref >60.00)
GLUCOSE: 91 mg/dL (ref 70–99)
POTASSIUM: 4.7 meq/L (ref 3.5–5.1)
SODIUM: 137 meq/L (ref 135–145)
Total Bilirubin: 0.3 mg/dL (ref 0.2–1.2)
Total Protein: 6.9 g/dL (ref 6.0–8.3)

## 2017-01-05 LAB — LIPID PANEL
CHOL/HDL RATIO: 2
Cholesterol: 229 mg/dL — ABNORMAL HIGH (ref 0–200)
HDL: 96.5 mg/dL (ref >39.00)
LDL CALC: 112 mg/dL — AB (ref 0–99)
NonHDL: 132.46
Triglycerides: 103 mg/dL (ref 0.0–149.0)
VLDL: 20.6 mg/dL (ref 0.0–40.0)

## 2017-01-05 LAB — CBC
HEMATOCRIT: 35.4 % — AB (ref 36.0–46.0)
HEMOGLOBIN: 11.5 g/dL — AB (ref 12.0–15.0)
MCHC: 32.3 g/dL (ref 30.0–36.0)
MCV: 87.5 fl (ref 78.0–100.0)
Platelets: 284 10*3/uL (ref 150.0–400.0)
RBC: 4.05 Mil/uL (ref 3.87–5.11)
RDW: 15.4 % (ref 11.5–15.5)
WBC: 6.7 10*3/uL (ref 4.0–10.5)

## 2017-01-05 LAB — TSH: TSH: 0.8 u[IU]/mL (ref 0.35–4.50)

## 2017-02-01 ENCOUNTER — Other Ambulatory Visit: Payer: Self-pay | Admitting: Family Medicine

## 2017-02-16 ENCOUNTER — Other Ambulatory Visit: Payer: Self-pay | Admitting: Family Medicine

## 2017-04-06 ENCOUNTER — Ambulatory Visit (HOSPITAL_BASED_OUTPATIENT_CLINIC_OR_DEPARTMENT_OTHER)
Admission: RE | Admit: 2017-04-06 | Discharge: 2017-04-06 | Disposition: A | Discharge status: Home / Self Care | Payer: Medicare HMO | Source: Ambulatory Visit | Attending: Family Medicine | Admitting: Family Medicine

## 2017-04-06 ENCOUNTER — Ambulatory Visit (INDEPENDENT_AMBULATORY_CARE_PROVIDER_SITE_OTHER): Payer: Medicare HMO | Admitting: Family Medicine

## 2017-04-06 ENCOUNTER — Encounter: Payer: Self-pay | Admitting: Family Medicine

## 2017-04-06 ENCOUNTER — Other Ambulatory Visit: Payer: Self-pay | Admitting: Family Medicine

## 2017-04-06 VITALS — BP 140/72 | HR 74 | Temp 97.5°F | Resp 18 | Wt 156.6 lb

## 2017-04-06 DIAGNOSIS — M545 Low back pain: Secondary | ICD-10-CM

## 2017-04-06 DIAGNOSIS — E782 Mixed hyperlipidemia: Secondary | ICD-10-CM | POA: Diagnosis not present

## 2017-04-06 DIAGNOSIS — M5136 Other intervertebral disc degeneration, lumbar region: Secondary | ICD-10-CM | POA: Diagnosis not present

## 2017-04-06 DIAGNOSIS — N184 Chronic kidney disease, stage 4 (severe): Secondary | ICD-10-CM

## 2017-04-06 DIAGNOSIS — I10 Essential (primary) hypertension: Secondary | ICD-10-CM | POA: Diagnosis not present

## 2017-04-06 DIAGNOSIS — N289 Disorder of kidney and ureter, unspecified: Secondary | ICD-10-CM

## 2017-04-06 DIAGNOSIS — T7840XD Allergy, unspecified, subsequent encounter: Secondary | ICD-10-CM | POA: Diagnosis not present

## 2017-04-06 LAB — COMPREHENSIVE METABOLIC PANEL
ALT: 15 U/L (ref 0–35)
AST: 24 U/L (ref 0–37)
Albumin: 3.6 g/dL (ref 3.5–5.2)
Alkaline Phosphatase: 64 U/L (ref 39–117)
BUN: 45 mg/dL — ABNORMAL HIGH (ref 6–23)
CALCIUM: 10.2 mg/dL (ref 8.4–10.5)
CHLORIDE: 101 meq/L (ref 96–112)
CO2: 26 meq/L (ref 19–32)
Creatinine, Ser: 2.38 mg/dL — ABNORMAL HIGH (ref 0.40–1.20)
GFR: 21.33 mL/min — ABNORMAL LOW (ref >60.00)
GLUCOSE: 106 mg/dL — AB (ref 70–99)
POTASSIUM: 4.4 meq/L (ref 3.5–5.1)
Sodium: 134 mEq/L — ABNORMAL LOW (ref 135–145)
TOTAL PROTEIN: 6.8 g/dL (ref 6.0–8.3)
Total Bilirubin: 0.3 mg/dL (ref 0.2–1.2)

## 2017-04-06 MED ORDER — AMLODIPINE BESYLATE 5 MG PO TABS: 5.0000 mg | ORAL_TABLET | Freq: Two times a day (BID) | ORAL | 1 refills | Status: DC

## 2017-04-06 MED ORDER — HYDRALAZINE HCL 10 MG PO TABS: 10.0000 mg | ORAL_TABLET | Freq: Two times a day (BID) | ORAL | 3 refills | Status: DC

## 2017-04-06 NOTE — Progress Notes (Signed)
Subjective:  I acted as a Education administrator for Dr. Charlett Blake. [name], [place]  Patient ID: Jillian Hayes, female    DOB: July 22, 1945, 72 y.o.   MRN: [id number]  No chief complaint on file.   HPI  Patient is in today for a 3 month follow up. No recent febrile illness or acute hospitalizations. Denies CP/palp/SOB/HA/congestion/fevers/GI or GU c/o. Taking meds as prescribed. She has been struggling with increased low back pain with radiation to left hip and radiation into left thigh at times. No falls or trauma. She has been noting a few days of increased nasal congesion with sneezing, fevers and dry cough. She feels her symptoms are improving. Denies CP/palp/SOB/GI or GU c/o. Taking meds as prescribed   Patient Care Team: [name], MD as PCP - General (Family Medicine)   Past Medical History:  Diagnosis Date  . Advanced care planning/counseling discussion 06/10/2014   05/31/2014 patient presents copy of HCP and Living Will  . Anemia    iron deficiency  . Anxiety   . Benign fundic gland polyps of stomach   . Bladder polyps 06/25/2010  . Chronic headaches 06/24/2010  . Chronic renal insufficiency, stage 4 (severe) ([place]) 11/09/2015  . Depression 1991   hospitalized  . History of chicken pox 06/25/2010  . Hypercalcemia 02/18/2014  . Hypertension   . Insomnia 06/24/2010  . Multiple chemical sensitivity syndrome 06/25/2010  . Proteinuria 02/18/2014  . Renal insufficiency 03/26/2011  . Valvular heart disease 04/28/2016    Past Surgical History:  Procedure Laterality Date  . cyst on left breast removed Left 1991   benign  . NASAL SEPTUM SURGERY  1986   rhinoplasty  . polyps on bladder removed  1972   benign  . TONSILLECTOMY  1962    Family History  Problem Relation Age of Onset  . Breast cancer Mother 65       left breast removed, 2010 lung  . Diabetes Mother   . Nephrolithiasis Father   . Heart attack Father 22       X 3  . Heart disease Father        smoker  . Hypertension Brother     . Allergic rhinitis Brother   . Melanoma Son 37       melanoma on leg removed  . Pancreatic cancer Maternal Grandmother   . Hypertension Paternal Grandmother   . Obesity Paternal Grandmother   . Heart attack Paternal Grandfather 24  . Diabetes Maternal Aunt        x 2  . Diabetes Maternal Uncle        x 3  . Asthma Neg Hx   . Eczema Neg Hx   . Urticaria Neg Hx   . Immunodeficiency Neg Hx   . Angioedema Neg Hx     Social History   Socioeconomic History  . Marital status: Married    Spouse name: Not on file  . Number of children: 2  . Years of education: Not on file  . Highest education level: Not on file  Social Needs  . Financial resource strain: Not on file  . Food insecurity - worry: Not on file  . Food insecurity - inability: Not on file  . Transportation needs - medical: Not on file  . Transportation needs - non-medical: Not on file  Occupational History  . Occupation: retired  Tobacco Use  . Smoking status: Never Smoker  . Smokeless tobacco: Never Used  Substance and Sexual Activity  . Alcohol  use: No  . Drug use: No  . Sexual activity: Yes    Partners: Male    Comment: lives with husband, wears with husband  Other Topics Concern  . Not on file  Social History Narrative   Lives with husband.      Outpatient Medications Prior to Visit  Medication Sig Dispense Refill  . ALFALFA PO Take 4,500 mg by mouth daily.      Marland Kitchen aspirin EC 81 MG tablet Take 1 tablet (81 mg total) by mouth daily.    Marland Kitchen b complex vitamins capsule Take 1 capsule by mouth daily.    . carvedilol (COREG) 3.125 MG tablet TAKE 1 TABLET BY MOUTH TWICE DAILY 180 tablet 1  . Ferrous Fumarate (HEMOCYTE) 324 (106 Fe) MG TABS tablet Take 1 tablet (106 mg of iron total) by mouth daily. 30 tablet 3  . Flaxseed, Linseed, (FLAX SEED OIL PO) Take 1 capsule 2 (two) times daily by mouth.     . L-GLUTAMINE PO Take 1 capsule by mouth daily.    . Probiotic Product (PROBIOTIC DAILY PO) Take 1 tablet by mouth  daily.    . vitamin C (ASCORBIC ACID) 500 MG tablet Take 500 mg by mouth daily as needed.     Marland Kitchen amLODipine (NORVASC) 5 MG tablet TAKE 1 TABLET BY MOUTH TWICE DAILY 180 tablet 1  . hydrALAZINE (APRESOLINE) 10 MG tablet Take 1 tablet (10 mg total) by mouth 2 (two) times daily.    . hydrALAZINE (APRESOLINE) 10 MG tablet TAKE 1 TABLET BY MOUTH THREE TIMES DAILY 270 tablet 0   No facility-administered medications prior to visit.     Allergies  Allergen Reactions  . Sulfa Antibiotics Nausea And Vomiting    Review of Systems  Constitutional: Negative for fever.  HENT: Positive for congestion.   Eyes: Negative for blurred vision.  Respiratory: Positive for cough.   Cardiovascular: Negative for chest pain and palpitations.  Gastrointestinal: Negative for vomiting.  Musculoskeletal: Positive for back pain and joint pain.  Skin: Negative for rash.  Neurological: Negative for loss of consciousness and headaches.       Objective:    Physical Exam  Constitutional: She is oriented to person, place, and time. She appears well-developed and well-nourished. No distress.  HENT:  Head: Normocephalic and atraumatic.  Nose: Nose normal.  Eyes: Right eye exhibits no discharge. Left eye exhibits no discharge.  Neck: Normal range of motion. Neck supple.  Cardiovascular: Normal rate and regular rhythm.  No murmur heard. Pulmonary/Chest: Effort normal and breath sounds normal.  Abdominal: Soft. Bowel sounds are normal. There is no tenderness.  Musculoskeletal: She exhibits no edema.  Neurological: She is alert and oriented to person, place, and time.  Skin: Skin is warm and dry.  Psychiatric: She has a normal mood and affect.  Nursing note and vitals reviewed.   BP 140/72 (BP Location: Left Arm, Patient Position: Sitting, Cuff Size: Normal)   Pulse 74   Temp (!) 97.5 F (36.4 C) (Oral)   Resp 18   Wt 156 lb 9.6 oz (71 kg)   SpO2 98%   BMI 24.58 kg/m  Wt Readings from Last 3 Encounters:    04/06/17 156 lb 9.6 oz (71 kg)  01/04/17 156 lb 12.8 oz (71.1 kg)  12/17/16 158 lb 9.6 oz (71.9 kg)   BP Readings from Last 3 Encounters:  04/06/17 140/72  01/04/17 138/68  12/17/16 136/60     Immunization History  Administered Date(s) Administered  .  Tdap 06/10/2012  . Zoster 01/03/2012    Health Maintenance  Topic Date Due  . PNA vac Low Risk Adult (1 of 2 - PCV13) 11/23/2010  . INFLUENZA VACCINE  12/07/2017 (Originally 09/02/2016)  . MAMMOGRAM  10/15/2018  . TETANUS/TDAP  06/11/2022  . COLONOSCOPY  08/05/2022  . DEXA SCAN  Completed  . Hepatitis C Screening  Completed    Lab Results  Component Value Date   WBC 6.7 01/04/2017   HGB 11.5 (L) 01/04/2017   HCT 35.4 (L) 01/04/2017   PLT 284.0 01/04/2017   GLUCOSE 106 (H) 04/06/2017   CHOL 229 (H) 01/04/2017   TRIG 103.0 01/04/2017   HDL 96.50 01/04/2017   LDLCALC 112 (H) 01/04/2017   ALT 15 04/06/2017   AST 24 04/06/2017   NA 134 (L) 04/06/2017   K 4.4 04/06/2017   CL 101 04/06/2017   CREATININE 2.38 (H) 04/06/2017   BUN 45 (H) 04/06/2017   CO2 26 04/06/2017   TSH 0.80 01/04/2017    Lab Results  Component Value Date   TSH 0.80 01/04/2017   Lab Results  Component Value Date   WBC 6.7 01/04/2017   HGB 11.5 (L) 01/04/2017   HCT 35.4 (L) 01/04/2017   MCV 87.5 01/04/2017   PLT 284.0 01/04/2017   Lab Results  Component Value Date   NA 134 (L) 04/06/2017   K 4.4 04/06/2017   CHLORIDE 109 03/25/2016   CO2 26 04/06/2017   GLUCOSE 106 (H) 04/06/2017   BUN 45 (H) 04/06/2017   CREATININE 2.38 (H) 04/06/2017   BILITOT 0.3 04/06/2017   ALKPHOS 64 04/06/2017   AST 24 04/06/2017   ALT 15 04/06/2017   PROT 6.8 04/06/2017   ALBUMIN 3.6 04/06/2017   CALCIUM 10.2 04/06/2017   ANIONGAP 6 07/19/2016   EGFR 20 (L) 03/25/2016   GFR 21.33 (L) 04/06/2017   Lab Results  Component Value Date   CHOL 229 (H) 01/04/2017   Lab Results  Component Value Date   HDL 96.50 01/04/2017   Lab Results  Component Value  Date   LDLCALC 112 (H) 01/04/2017   Lab Results  Component Value Date   TRIG 103.0 01/04/2017   Lab Results  Component Value Date   CHOLHDL 2 01/04/2017   No results found for: HGBA1C       Assessment & Plan:   Problem List Items Addressed This Visit    Allergy    Has had some increased stuffiness, sneezing, cough and fevers but those symptoms are improving. Encouraged increased rest and hydration, add probiotics, zinc such as Coldeze or Xicam. Treat fevers as needed. Mucinex bid      HTN (hypertension)    Well controlled, no changes to meds. Encouraged heart healthy diet such as the DASH diet and exercise as tolerated.       Relevant Medications   hydrALAZINE (APRESOLINE) 10 MG tablet   amLODipine (NORVASC) 5 MG tablet   Other Relevant Orders   Comprehensive metabolic panel (Completed)   Chronic renal insufficiency, stage 4 (severe) (HCC)    Creatinine continues to climb will refer to nephrology for consultation      Hyperlipidemia    Encouraged heart healthy diet, increase exercise, avoid trans fats, consider a krill oil cap daily      Relevant Medications   hydrALAZINE (APRESOLINE) 10 MG tablet   amLODipine (NORVASC) 5 MG tablet   DDD (degenerative disc disease), lumbar    Struggling with low back pain and radicular symptoms to left  hip and thigh pain at times. Xray confirms mild DDD at multiple levels. Encouraged moist heat and gentle stretching as tolerated. May try NSAIDs and prescription meds as directed and report if symptoms worsen or seek immediate care. If pain continues to worsen she will call us for referral.       Other Visit Diagnoses    Left low back pain, unspecified chronicity, with sciatica presence unspecified    -  Primary   Relevant Orders   DG Lumbar Spine Complete (Completed)      I have changed Eleana S. Rosemond's amLODipine. I am also having her maintain her ALFALFA PO, vitamin C, (Flaxseed, Linseed, (FLAX SEED OIL PO)), b complex  vitamins, L-GLUTAMINE PO, Probiotic Product (PROBIOTIC DAILY PO), Ferrous Fumarate, aspirin EC, carvedilol, and hydrALAZINE.  Meds ordered this encounter  Medications  . hydrALAZINE (APRESOLINE) 10 MG tablet    Sig: Take 1 tablet (10 mg total) by mouth 2 (two) times daily.    Dispense:  180 tablet    Refill:  3  . amLODipine (NORVASC) 5 MG tablet    Sig: Take 1 tablet (5 mg total) by mouth 2 (two) times daily.    Dispense:  180 tablet    Refill:  1    CMA served as scribe during this visit. History, Physical and Plan performed by medical provider. Documentation and orders reviewed and attested to.  [name], MD

## 2017-04-06 NOTE — Patient Instructions (Addendum)
Encouraged increased rest and hydration, add probiotics, zinc such as Coldeze or Xicam. Treat fevers as needed. Vitamin C 500 mg and elderberry, Plain Mucinex twice daily Zyrtec and nasal saline  Tylenol ES 500 mg tabs, 1 tab twice daily, max of 3000 mg or 6 tabs in 24 hours. Can increase to 2 tabs 3 x daily on a very bad day Back Pain, Adult Many adults have back pain from time to time. Common causes of back pain include:  A strained muscle or ligament.  Wear and tear (degeneration) of the spinal disks.  Arthritis.  A hit to the back.  Back pain can be short-lived (acute) or last a long time (chronic). A physical exam, lab tests, and imaging studies may be done to find the cause of your pain. Follow these instructions at home: Managing pain and stiffness  Take over-the-counter and prescription medicines only as told by your health care provider.  If directed, apply heat to the affected area as often as told by your health care provider. Use the heat source that your health care provider recommends, such as a moist heat pack or a heating pad. ? Place a towel between your skin and the heat source. ? Leave the heat on for 20-30 minutes. ? Remove the heat if your skin turns bright red. This is especially important if you are unable to feel pain, heat, or cold. You have a greater risk of getting burned.  If directed, apply ice to the injured area: ? Put ice in a plastic bag. ? Place a towel between your skin and the bag. ? Leave the ice on for 20 minutes, 2-3 times a day for the first 2-3 days. Activity  Do not stay in bed. Resting more than 1-2 days can delay your recovery.  Take short walks on even surfaces as soon as you are able. Try to increase the length of time you walk each day.  Do not sit, drive, or stand in one place for more than 30 minutes at a time. Sitting or standing for long periods of time can put stress on your back.  Use proper lifting techniques. When you bend  and lift, use positions that put less stress on your back: ? West your knees. ? Keep the load close to your body. ? Avoid twisting.  Exercise regularly as told by your health care provider. Exercising will help your back heal faster. This also helps prevent back injuries by keeping muscles strong and flexible.  Your health care provider may recommend that you see a physical therapist. This person can help you come up with a safe exercise program. Do any exercises as told by your physical therapist. Lifestyle  Maintain a healthy weight. Extra weight puts stress on your back and makes it difficult to have good posture.  Avoid activities or situations that make you feel anxious or stressed. Learn ways to manage anxiety and stress. One way to manage stress is through exercise. Stress and anxiety increase muscle tension and can make back pain worse. General instructions  Sleep on a firm mattress in a comfortable position. Try lying on your side with your knees slightly bent. If you lie on your back, put a pillow under your knees.  Follow your treatment plan as told by your health care provider. This may include: ? Cognitive or behavioral therapy. ? Acupuncture or massage therapy. ? Meditation or yoga. Contact a health care provider if:  You have pain that is not relieved with rest  or medicine.  You have increasing pain going down into your legs or buttocks.  Your pain does not improve in 2 weeks.  You have pain at night.  You lose weight.  You have a fever or chills. Get help right away if:  You develop new bowel or bladder control problems.  You have unusual weakness or numbness in your arms or legs.  You develop nausea or vomiting.  You develop abdominal pain.  You feel faint. Summary  Many adults have back pain from time to time. A physical exam, lab tests, and imaging studies may be done to find the cause of your pain.  Use proper lifting techniques. When you bend and  lift, use positions that put less stress on your back.  Take over-the-counter and prescription medicines and apply heat or ice as directed by your health care provider. This information is not intended to replace advice given to you by your health care provider. Make sure you discuss any questions you have with your health care provider. Document Released: 01/19/2005 Document Revised: 02/24/2016 Document Reviewed: 02/24/2016 Elsevier Interactive Patient Education  [employer].

## 2017-04-07 DIAGNOSIS — M5136 Other intervertebral disc degeneration, lumbar region: Secondary | ICD-10-CM | POA: Insufficient documentation

## 2017-04-07 NOTE — Assessment & Plan Note (Signed)
Creatinine continues to climb will refer to nephrology for consultation

## 2017-04-07 NOTE — Assessment & Plan Note (Signed)
Has had some increased stuffiness, sneezing, cough and fevers but those symptoms are improving. Encouraged increased rest and hydration, add probiotics, zinc such as Coldeze or Xicam. Treat fevers as needed. Mucinex bid

## 2017-04-07 NOTE — Assessment & Plan Note (Signed)
Struggling with low back pain and radicular symptoms to left hip and thigh pain at times. Xray confirms mild DDD at multiple levels. Encouraged moist heat and gentle stretching as tolerated. May try NSAIDs and prescription meds as directed and report if symptoms worsen or seek immediate care. If pain continues to worsen she will call us for referral.

## 2017-04-07 NOTE — Assessment & Plan Note (Signed)
Well controlled, no changes to meds. Encouraged heart healthy diet such as the DASH diet and exercise as tolerated.  °

## 2017-04-07 NOTE — Assessment & Plan Note (Signed)
Encouraged heart healthy diet, increase exercise, avoid trans fats, consider a krill oil cap daily 

## 2017-04-19 ENCOUNTER — Encounter: Payer: Self-pay | Admitting: Family Medicine

## 2017-04-29 DIAGNOSIS — N184 Chronic kidney disease, stage 4 (severe): Secondary | ICD-10-CM | POA: Diagnosis not present

## 2017-05-04 DIAGNOSIS — E213 Hyperparathyroidism, unspecified: Secondary | ICD-10-CM | POA: Diagnosis not present

## 2017-05-04 DIAGNOSIS — N184 Chronic kidney disease, stage 4 (severe): Secondary | ICD-10-CM | POA: Diagnosis not present

## 2017-05-04 DIAGNOSIS — D631 Anemia in chronic kidney disease: Secondary | ICD-10-CM | POA: Diagnosis not present

## 2017-05-04 DIAGNOSIS — R809 Proteinuria, unspecified: Secondary | ICD-10-CM | POA: Diagnosis not present

## 2017-05-04 DIAGNOSIS — I129 Hypertensive chronic kidney disease with stage 1 through stage 4 chronic kidney disease, or unspecified chronic kidney disease: Secondary | ICD-10-CM | POA: Diagnosis not present

## 2017-06-01 DIAGNOSIS — N184 Chronic kidney disease, stage 4 (severe): Secondary | ICD-10-CM | POA: Diagnosis not present

## 2017-06-01 DIAGNOSIS — D631 Anemia in chronic kidney disease: Secondary | ICD-10-CM | POA: Diagnosis not present

## 2017-06-03 DIAGNOSIS — D631 Anemia in chronic kidney disease: Secondary | ICD-10-CM | POA: Diagnosis not present

## 2017-06-03 DIAGNOSIS — I129 Hypertensive chronic kidney disease with stage 1 through stage 4 chronic kidney disease, or unspecified chronic kidney disease: Secondary | ICD-10-CM | POA: Diagnosis not present

## 2017-06-03 DIAGNOSIS — N184 Chronic kidney disease, stage 4 (severe): Secondary | ICD-10-CM | POA: Diagnosis not present

## 2017-06-21 DIAGNOSIS — R69 Illness, unspecified: Secondary | ICD-10-CM | POA: Diagnosis not present

## 2017-07-06 ENCOUNTER — Other Ambulatory Visit: Payer: Self-pay | Admitting: Family Medicine

## 2017-07-12 DIAGNOSIS — N184 Chronic kidney disease, stage 4 (severe): Secondary | ICD-10-CM | POA: Diagnosis not present

## 2017-07-15 DIAGNOSIS — E213 Hyperparathyroidism, unspecified: Secondary | ICD-10-CM | POA: Diagnosis not present

## 2017-07-15 DIAGNOSIS — N184 Chronic kidney disease, stage 4 (severe): Secondary | ICD-10-CM | POA: Diagnosis not present

## 2017-07-15 DIAGNOSIS — R809 Proteinuria, unspecified: Secondary | ICD-10-CM | POA: Diagnosis not present

## 2017-07-15 DIAGNOSIS — I129 Hypertensive chronic kidney disease with stage 1 through stage 4 chronic kidney disease, or unspecified chronic kidney disease: Secondary | ICD-10-CM | POA: Diagnosis not present

## 2017-08-02 ENCOUNTER — Other Ambulatory Visit: Payer: Self-pay | Admitting: Family Medicine

## 2017-08-06 DIAGNOSIS — R300 Dysuria: Secondary | ICD-10-CM | POA: Diagnosis not present

## 2017-09-21 DIAGNOSIS — R809 Proteinuria, unspecified: Secondary | ICD-10-CM | POA: Diagnosis not present

## 2017-09-27 DIAGNOSIS — N184 Chronic kidney disease, stage 4 (severe): Secondary | ICD-10-CM | POA: Diagnosis not present

## 2017-09-27 DIAGNOSIS — E213 Hyperparathyroidism, unspecified: Secondary | ICD-10-CM | POA: Diagnosis not present

## 2017-09-30 DIAGNOSIS — N184 Chronic kidney disease, stage 4 (severe): Secondary | ICD-10-CM | POA: Diagnosis not present

## 2017-09-30 DIAGNOSIS — D631 Anemia in chronic kidney disease: Secondary | ICD-10-CM | POA: Diagnosis not present

## 2017-09-30 DIAGNOSIS — I129 Hypertensive chronic kidney disease with stage 1 through stage 4 chronic kidney disease, or unspecified chronic kidney disease: Secondary | ICD-10-CM | POA: Diagnosis not present

## 2017-09-30 DIAGNOSIS — E213 Hyperparathyroidism, unspecified: Secondary | ICD-10-CM | POA: Diagnosis not present

## 2017-09-30 DIAGNOSIS — R809 Proteinuria, unspecified: Secondary | ICD-10-CM | POA: Diagnosis not present

## 2017-10-07 ENCOUNTER — Other Ambulatory Visit: Payer: Self-pay | Admitting: Family Medicine

## 2017-10-20 DIAGNOSIS — Z1231 Encounter for screening mammogram for malignant neoplasm of breast: Secondary | ICD-10-CM | POA: Diagnosis not present

## 2017-10-20 DIAGNOSIS — Z803 Family history of malignant neoplasm of breast: Secondary | ICD-10-CM | POA: Diagnosis not present

## 2017-10-20 LAB — HM MAMMOGRAPHY

## 2017-10-25 ENCOUNTER — Inpatient Hospital Stay (HOSPITAL_BASED_OUTPATIENT_CLINIC_OR_DEPARTMENT_OTHER)
Admission: EM | Admit: 2017-10-25 | Discharge: 2017-10-27 | DRG: 309 | Disposition: A | Discharge status: Home / Self Care | Payer: Medicare HMO | Attending: Family Medicine | Admitting: Family Medicine

## 2017-10-25 ENCOUNTER — Emergency Department (HOSPITAL_BASED_OUTPATIENT_CLINIC_OR_DEPARTMENT_OTHER): Payer: Medicare HMO

## 2017-10-25 ENCOUNTER — Encounter (HOSPITAL_BASED_OUTPATIENT_CLINIC_OR_DEPARTMENT_OTHER): Payer: Self-pay | Admitting: *Deleted

## 2017-10-25 ENCOUNTER — Other Ambulatory Visit: Payer: Self-pay

## 2017-10-25 ENCOUNTER — Ambulatory Visit: Payer: Self-pay | Admitting: Family Medicine

## 2017-10-25 DIAGNOSIS — R000 Tachycardia, unspecified: Secondary | ICD-10-CM | POA: Diagnosis not present

## 2017-10-25 DIAGNOSIS — E213 Hyperparathyroidism, unspecified: Secondary | ICD-10-CM | POA: Diagnosis present

## 2017-10-25 DIAGNOSIS — I480 Paroxysmal atrial fibrillation: Secondary | ICD-10-CM | POA: Diagnosis not present

## 2017-10-25 DIAGNOSIS — I4891 Unspecified atrial fibrillation: Secondary | ICD-10-CM

## 2017-10-25 DIAGNOSIS — D631 Anemia in chronic kidney disease: Secondary | ICD-10-CM | POA: Diagnosis present

## 2017-10-25 DIAGNOSIS — Z808 Family history of malignant neoplasm of other organs or systems: Secondary | ICD-10-CM

## 2017-10-25 DIAGNOSIS — Z8719 Personal history of other diseases of the digestive system: Secondary | ICD-10-CM

## 2017-10-25 DIAGNOSIS — I517 Cardiomegaly: Secondary | ICD-10-CM | POA: Diagnosis present

## 2017-10-25 DIAGNOSIS — E785 Hyperlipidemia, unspecified: Secondary | ICD-10-CM | POA: Diagnosis present

## 2017-10-25 DIAGNOSIS — Z7982 Long term (current) use of aspirin: Secondary | ICD-10-CM

## 2017-10-25 DIAGNOSIS — I350 Nonrheumatic aortic (valve) stenosis: Secondary | ICD-10-CM | POA: Diagnosis present

## 2017-10-25 DIAGNOSIS — N186 End stage renal disease: Secondary | ICD-10-CM | POA: Diagnosis present

## 2017-10-25 DIAGNOSIS — I361 Nonrheumatic tricuspid (valve) insufficiency: Secondary | ICD-10-CM | POA: Diagnosis not present

## 2017-10-25 DIAGNOSIS — Z8249 Family history of ischemic heart disease and other diseases of the circulatory system: Secondary | ICD-10-CM

## 2017-10-25 DIAGNOSIS — I129 Hypertensive chronic kidney disease with stage 1 through stage 4 chronic kidney disease, or unspecified chronic kidney disease: Secondary | ICD-10-CM | POA: Diagnosis present

## 2017-10-25 DIAGNOSIS — F329 Major depressive disorder, single episode, unspecified: Secondary | ICD-10-CM | POA: Diagnosis present

## 2017-10-25 DIAGNOSIS — I481 Persistent atrial fibrillation: Principal | ICD-10-CM | POA: Diagnosis present

## 2017-10-25 DIAGNOSIS — N184 Chronic kidney disease, stage 4 (severe): Secondary | ICD-10-CM | POA: Diagnosis not present

## 2017-10-25 DIAGNOSIS — Z803 Family history of malignant neoplasm of breast: Secondary | ICD-10-CM

## 2017-10-25 DIAGNOSIS — Z8679 Personal history of other diseases of the circulatory system: Secondary | ICD-10-CM

## 2017-10-25 DIAGNOSIS — Z882 Allergy status to sulfonamides status: Secondary | ICD-10-CM

## 2017-10-25 DIAGNOSIS — E782 Mixed hyperlipidemia: Secondary | ICD-10-CM

## 2017-10-25 DIAGNOSIS — Z79899 Other long term (current) drug therapy: Secondary | ICD-10-CM

## 2017-10-25 DIAGNOSIS — Z833 Family history of diabetes mellitus: Secondary | ICD-10-CM

## 2017-10-25 DIAGNOSIS — I10 Essential (primary) hypertension: Secondary | ICD-10-CM | POA: Diagnosis not present

## 2017-10-25 DIAGNOSIS — Z9889 Other specified postprocedural states: Secondary | ICD-10-CM

## 2017-10-25 DIAGNOSIS — Z800 Family history of malignant neoplasm of digestive organs: Secondary | ICD-10-CM

## 2017-10-25 DIAGNOSIS — F419 Anxiety disorder, unspecified: Secondary | ICD-10-CM | POA: Diagnosis present

## 2017-10-25 DIAGNOSIS — Z8619 Personal history of other infectious and parasitic diseases: Secondary | ICD-10-CM

## 2017-10-25 DIAGNOSIS — R531 Weakness: Secondary | ICD-10-CM | POA: Diagnosis not present

## 2017-10-25 LAB — PROTIME-INR
INR: 0.96
PROTHROMBIN TIME: 12.7 s (ref 11.4–15.2)

## 2017-10-25 LAB — BASIC METABOLIC PANEL
ANION GAP: 13 (ref 5–15)
BUN: 57 mg/dL — AB (ref 8–23)
CHLORIDE: 103 mmol/L (ref 98–111)
CO2: 23 mmol/L (ref 22–32)
Calcium: 10.7 mg/dL — ABNORMAL HIGH (ref 8.9–10.3)
Creatinine, Ser: 2.42 mg/dL — ABNORMAL HIGH (ref 0.44–1.00)
GFR calc non Af Amer: 19 mL/min — ABNORMAL LOW (ref >60)
GFR, EST AFRICAN AMERICAN: 22 mL/min — AB (ref >60)
Glucose, Bld: 154 mg/dL — ABNORMAL HIGH (ref 70–99)
POTASSIUM: 4 mmol/L (ref 3.5–5.1)
SODIUM: 139 mmol/L (ref 135–145)

## 2017-10-25 LAB — T4, FREE: Free T4: 1.05 ng/dL (ref 0.82–1.77)

## 2017-10-25 LAB — CBC
HCT: 41.1 % (ref 36.0–46.0)
HEMOGLOBIN: 13.7 g/dL (ref 12.0–15.0)
MCH: 28.4 pg (ref 26.0–34.0)
MCHC: 33.3 g/dL (ref 30.0–36.0)
MCV: 85.1 fL (ref 78.0–100.0)
Platelets: 270 10*3/uL (ref 150–400)
RBC: 4.83 MIL/uL (ref 3.87–5.11)
RDW: 16.3 % — ABNORMAL HIGH (ref 11.5–15.5)
WBC: 7.6 10*3/uL (ref 4.0–10.5)

## 2017-10-25 LAB — APTT: APTT: 20 s — AB (ref 24–36)

## 2017-10-25 LAB — HEMOGLOBIN A1C
HEMOGLOBIN A1C: 5.7 % — AB (ref 4.8–5.6)
MEAN PLASMA GLUCOSE: 116.89 mg/dL

## 2017-10-25 LAB — TROPONIN I: Troponin I: 0.03 ng/mL (ref <0.03)

## 2017-10-25 LAB — TSH: TSH: 1.922 u[IU]/mL (ref 0.350–4.500)

## 2017-10-25 MED ORDER — SODIUM CHLORIDE 0.9 % IV SOLN
INTRAVENOUS | Status: DC
  Administered 2017-10-25 – 2017-10-27 (×2): via INTRAVENOUS

## 2017-10-25 MED ORDER — HEPARIN (PORCINE) IN NACL 100-0.45 UNIT/ML-% IJ SOLN
850.0000 [IU]/h | INTRAMUSCULAR | Status: DC
  Administered 2017-10-25 – 2017-10-27 (×3): 850 [IU]/h via INTRAVENOUS
  Filled 2017-10-25 (×2): qty 250

## 2017-10-25 MED ORDER — HEPARIN BOLUS VIA INFUSION
3500.0000 [IU] | Freq: Once | INTRAVENOUS | Status: AC
  Administered 2017-10-25: 3500 [IU] via INTRAVENOUS
  Filled 2017-10-25: qty 3500

## 2017-10-25 MED ORDER — DILTIAZEM LOAD VIA INFUSION
20.0000 mg | Freq: Once | INTRAVENOUS | Status: AC
  Administered 2017-10-25: 20 mg via INTRAVENOUS
  Filled 2017-10-25: qty 20

## 2017-10-25 MED ORDER — DILTIAZEM HCL-DEXTROSE 100-5 MG/100ML-% IV SOLN (PREMIX)
5.0000 mg/h | INTRAVENOUS | Status: DC
  Administered 2017-10-25 – 2017-10-26 (×2): 15 mg/h via INTRAVENOUS
  Filled 2017-10-25 (×2): qty 100

## 2017-10-25 MED ORDER — DILTIAZEM HCL 25 MG/5ML IV SOLN
INTRAVENOUS | Status: AC
  Filled 2017-10-25: qty 15

## 2017-10-25 MED ORDER — CARVEDILOL 3.125 MG PO TABS
3.1250 mg | ORAL_TABLET | Freq: Two times a day (BID) | ORAL | Status: DC
  Administered 2017-10-25 – 2017-10-27 (×4): 3.125 mg via ORAL
  Filled 2017-10-25 (×4): qty 1

## 2017-10-25 MED ORDER — ACETAMINOPHEN 325 MG PO TABS: 650.0000 mg | ORAL_TABLET | ORAL | Status: DC | PRN

## 2017-10-25 MED ORDER — DILTIAZEM HCL 25 MG/5ML IV SOLN
INTRAVENOUS | Status: AC
  Filled 2017-10-25: qty 5

## 2017-10-25 MED ORDER — DILTIAZEM HCL-DEXTROSE 100-5 MG/100ML-% IV SOLN (PREMIX)
5.0000 mg/h | INTRAVENOUS | Status: DC
  Administered 2017-10-25: 15 mg/h via INTRAVENOUS
  Administered 2017-10-25: 5 mg/h via INTRAVENOUS
  Filled 2017-10-25: qty 100

## 2017-10-25 MED ORDER — ONDANSETRON HCL 4 MG/2ML IJ SOLN: 4.0000 mg | Freq: Four times a day (QID) | INTRAMUSCULAR | Status: DC | PRN

## 2017-10-25 NOTE — Progress Notes (Signed)
Patient is a 72yo with h/o valvular heart disease; HTN; stage 4 CKD: and hypercalcemia presenting to [facility] with weakness.   Dr. Ashok Cordia is requesting transfer for afib, on Cardizem drip.  She reports weakness for several days, noticed palpitations and checked her heart rate and it was high.  New-onset afib, HR 140-150.  Cardizem has controlled the rate.  Uncertain duration.  No real chest pain, troponin 0.03.  CBC and chemistries stable.  CXR is fine.  Since she is on a Cardizem drip, she will need an SDU bed.  While awaiting a SDU bed, if she is able to transition to PO Cardizem and PO Eliquis with reasonable rate control, discharge with close outpatient f/u would also be potentially appropriate.  Carlyon Shadow, M.D.

## 2017-10-25 NOTE — ED Provider Notes (Addendum)
[facility] EMERGENCY DEPARTMENT Provider Note   CSN: [id number] Arrival date & time: 10/25/17  1302     History   Chief Complaint Chief Complaint  Patient presents with  . Weakness    HPI Jillian Hayes is a 72 y.o. female.  Patient c/o feeling generally weak the past few days. During this period, at times, notes palpitations, generalized weakness, and mild sob/fatigue. symptoms moderate, persistent, felt worse today. Denies chest pain or discomfort. No cough or uri c/o. No fever or chills. No leg pain or swelling. States compliant w normal meds, no recent change. No hx afib. No etoh use. No heat intolerance, sweats, or recent wt loss.     The history is provided by the patient.  Weakness  Pertinent negatives include no shortness of breath, no chest pain, no vomiting, no confusion and no headaches.    Past Medical History:  Diagnosis Date  . Advanced care planning/counseling discussion 06/10/2014   05/31/2014 patient presents copy of HCP and Living Will  . Anemia    iron deficiency  . Anxiety   . Benign fundic gland polyps of stomach   . Bladder polyps 06/25/2010  . Chronic headaches 06/24/2010  . Chronic renal insufficiency, stage 4 (severe) ([place]) 11/09/2015  . Depression 1991   hospitalized  . History of chicken pox 06/25/2010  . Hypercalcemia 02/18/2014  . Hypertension   . Insomnia 06/24/2010  . Multiple chemical sensitivity syndrome 06/25/2010  . Proteinuria 02/18/2014  . Renal insufficiency 03/26/2011  . Valvular heart disease 04/28/2016    Patient Active Problem List   Diagnosis Date Noted  . DDD (degenerative disc disease), lumbar 04/07/2017  . Hearing loss 01/04/2017  . Hyperlipidemia 01/04/2017  . Arthritis of fingers of both hands 10/01/2016  . Chronic rhinitis 10/01/2016  . Nonrheumatic aortic valve insufficiency 09/29/2016  . Bruit 09/29/2016  . Palpitation 09/22/2016  . Aortic valve disease 04/28/2016  . Anemia of chronic renal failure, stage 4  (severe) ([place]) 03/03/2016  . Sun-damaged skin 01/21/2016  . Chronic renal insufficiency, stage 4 (severe) ([facility]) 11/09/2015  . Advanced care planning/counseling discussion 06/10/2014  . Pedal edema 03/28/2014  . Proteinuria 02/18/2014  . Hypercalcemia 02/18/2014  . Abnormal EKG 01/22/2014  . HTN (hypertension) 01/22/2014  . Dermatitis 06/30/2013  . Abnormal thyroid function test 06/25/2013  . Diarrhea 06/12/2012  . Cervical cancer screening 06/10/2012  . Medicare annual wellness visit, subsequent 11/09/2011  . Multiple chemical sensitivity syndrome 06/25/2010  . History of chicken pox 06/25/2010  . Bladder polyps 06/25/2010  . History of cardiac dysrhythmia 06/25/2010  . Insomnia 06/24/2010  . Overweight 06/24/2010  . Fatigue 06/24/2010  . Chronic headaches 06/24/2010  . Allergy   . Anemia   . Depression     Past Surgical History:  Procedure Laterality Date  . cyst on left breast removed Left 1991   benign  . NASAL SEPTUM SURGERY  1986   rhinoplasty  . polyps on bladder removed  1972   benign  . TONSILLECTOMY  1962     OB History   None      Home Medications    Prior to Admission medications   Medication Sig Start Date End Date Taking? Authorizing Provider  ALFALFA PO Take 4,500 mg by mouth daily.      [provider]  amLODipine (NORVASC) 5 MG tablet TAKE 1 TABLET BY MOUTH TWICE DAILY 10/08/17   [name], MD  aspirin EC 81 MG tablet Take 1 tablet (81 mg  total) by mouth daily. 07/27/16   [name], MD  b complex vitamins capsule Take 1 capsule by mouth daily.    [provider]  carvedilol (COREG) 3.125 MG tablet TAKE 1 TABLET BY MOUTH TWICE DAILY 08/03/17   [name], MD  Ferrous Fumarate (HEMOCYTE) 324 (106 Fe) MG TABS tablet Take 1 tablet (106 mg of iron total) by mouth daily. 11/12/15   [name], MD  Flaxseed, Linseed, (FLAX SEED OIL PO) Take 1 capsule 2 (two) times daily by mouth.     [provider]    hydrALAZINE (APRESOLINE) 10 MG tablet Take 1 tablet (10 mg total) by mouth 2 (two) times daily. 04/06/17   [name], MD  hydrALAZINE (APRESOLINE) 10 MG tablet TAKE 1 TABLET BY MOUTH THREE TIMES DAILY 07/06/17   [name], MD  L-GLUTAMINE PO Take 1 capsule by mouth daily.    [provider]  Probiotic Product (PROBIOTIC DAILY PO) Take 1 tablet by mouth daily.    [provider]  vitamin C (ASCORBIC ACID) 500 MG tablet Take 500 mg by mouth daily as needed.     [provider]    Family History Family History  Problem Relation Age of Onset  . Breast cancer Mother 62       left breast removed, 2010 lung  . Diabetes Mother   . Nephrolithiasis Father   . Heart attack Father 7       X 3  . Heart disease Father        smoker  . Hypertension Brother   . Allergic rhinitis Brother   . Melanoma Son 37       melanoma on leg removed  . Pancreatic cancer Maternal Grandmother   . Hypertension Paternal Grandmother   . Obesity Paternal Grandmother   . Heart attack Paternal Grandfather 35  . Diabetes Maternal Aunt        x 2  . Diabetes Maternal Uncle        x 3  . Asthma Neg Hx   . Eczema Neg Hx   . Urticaria Neg Hx   . Immunodeficiency Neg Hx   . Angioedema Neg Hx     Social History Social History   Tobacco Use  . Smoking status: Never Smoker  . Smokeless tobacco: Never Used  Substance Use Topics  . Alcohol use: No  . Drug use: No     Allergies   Sulfa antibiotics   Review of Systems Review of Systems  Constitutional: Negative for fever.  HENT: Negative for sore throat.   Eyes: Negative for redness.  Respiratory: Negative for shortness of breath.   Cardiovascular: Negative for chest pain.  Gastrointestinal: Negative for abdominal pain and vomiting.  Genitourinary: Negative for flank pain.  Musculoskeletal: Negative for neck pain.  Skin: Negative for rash.  Neurological: Positive for weakness. Negative for headaches.   Hematological: Does not bruise/bleed easily.  Psychiatric/Behavioral: Negative for confusion.     Physical Exam Updated Vital Signs BP 121/82   Pulse 70   Temp 97.7 F (36.5 C) (Oral)   Resp (!) 24   Ht 1.702 m (5\' 7" )   Wt 65.3 kg   SpO2 97%   BMI 22.55 kg/m   Physical Exam  Constitutional: She appears well-developed and well-nourished.  HENT:  Mouth/Throat: Oropharynx is clear and moist.  Eyes: Conjunctivae are normal. No scleral icterus.  Neck: Neck supple. No tracheal deviation present. No thyromegaly present.  Cardiovascular:  Normal heart sounds and intact distal pulses. Exam reveals no gallop and no friction rub.  No murmur heard. Irregular rhythm, tachycardic.   Pulmonary/Chest: Effort normal and breath sounds normal. No respiratory distress.  Abdominal: Soft. Normal appearance and bowel sounds are normal. She exhibits no distension. There is no tenderness.  Genitourinary:  Genitourinary Comments: No cva tenderness.   Musculoskeletal: She exhibits no edema.  Neurological: She is alert.  Skin: Skin is warm and dry. No rash noted.  Psychiatric: She has a normal mood and affect.  Nursing note and vitals reviewed.    ED Treatments / Results  Labs (all labs ordered are listed, but only abnormal results are displayed) Results for orders placed or performed during the hospital encounter of 19/37/90  Basic metabolic panel  Result Value Ref Range   Sodium 139 135 - 145 mmol/L   Potassium 4.0 3.5 - 5.1 mmol/L   Chloride 103 98 - 111 mmol/L   CO2 23 22 - 32 mmol/L   Glucose, Bld 154 (H) 70 - 99 mg/dL   BUN 57 (H) 8 - 23 mg/dL   Creatinine, Ser 2.42 (H) 0.44 - 1.00 mg/dL   Calcium 10.7 (H) 8.9 - 10.3 mg/dL   GFR calc non Af Amer 19 (L) >60 mL/min   GFR calc Af Amer 22 (L) >60 mL/min   Anion gap 13 5 - 15  CBC  Result Value Ref Range   WBC 7.6 4.0 - 10.5 K/uL   RBC 4.83 3.87 - 5.11 MIL/uL   Hemoglobin 13.7 12.0 - 15.0 g/dL   HCT 41.1 36.0 - 46.0 %   MCV 85.1  78.0 - 100.0 fL   MCH 28.4 26.0 - 34.0 pg   MCHC 33.3 30.0 - 36.0 g/dL   RDW 16.3 (H) 11.5 - 15.5 %   Platelets 270 150 - 400 K/uL   Dg Chest 2 View  Result Date: 10/25/2017 CLINICAL DATA:  Weakness since yesterday with tachycardia. EXAM: CHEST - 2 VIEW COMPARISON:  07/19/2016 FINDINGS: Lungs are adequately inflated and otherwise clear. Cardiomediastinal silhouette and remainder the exam is unchanged. IMPRESSION: No active cardiopulmonary disease. Electronically Signed   By: Marin Olp M.D.   On: 10/25/2017 14:49    EKG EKG Interpretation  Date/Time:  Monday October 25 2017 13:38:15 EDT Ventricular Rate:  129 PR Interval:    QRS Duration: 85 QT Interval:  307 QTC Calculation: 450 R Axis:   73 Text Interpretation:  Atrial fibrillation LVH with secondary repolarization abnormality Non-specific ST-t changes Confirmed by Lajean Saver [phone]) on 10/25/2017 1:42:59 PM   Radiology Dg Chest 2 View  Result Date: 10/25/2017 CLINICAL DATA:  Weakness since yesterday with tachycardia. EXAM: CHEST - 2 VIEW COMPARISON:  07/19/2016 FINDINGS: Lungs are adequately inflated and otherwise clear. Cardiomediastinal silhouette and remainder the exam is unchanged. IMPRESSION: No active cardiopulmonary disease. Electronically Signed   By: Marin Olp M.D.   On: 10/25/2017 14:49    Procedures Procedures (including critical care time)  Medications Ordered in ED Medications - No data to display   Initial Impression / Assessment and Plan / ED Course  I have reviewed the triage vital signs and the nursing notes.  Pertinent labs & imaging results that were available during my care of the patient were reviewed by me and considered in my medical decision making (see chart for details).  Iv ns. Continuous pulse ox and monitor. Labs. Ecg.  Reviewed nursing notes and prior charts for additional history.   cardizem iv bolus  and gtt.  cxr reviewed - no pna.   Labs reviewed - chem  normal.  Remains in afib. As generally weak for past couple days, and unclear exactly how long in afib, still in afib after cardizem (although rate improved), will admit.   pts doctors are with [facility]. [facility] hospitalists consulted for admission.   This patients CHA2DS2-VASc Score and unadjusted Ischemic Stroke Rate (% per year) is equal to 3.2 % stroke rate/year from a score of 3  Above score calculated as 1 point each if present [CHF, HTN, DM, Vascular=MI/PAD/Aortic Plaque, Age if 65-74, or Female] Above score calculated as 2 points each if present [Age > 75, or Stroke/TIA/TE]   Discussed with Dr Lorin Mercy - who accepts to [facility].     Final Clinical Impressions(s) / ED Diagnoses   Final diagnoses:  None    ED Discharge Orders    None            Lajean Saver, MD 10/25/17 1605

## 2017-10-25 NOTE — ED Triage Notes (Signed)
Weakness since yesterday am. States her heart rate has been over 100 and irregular since yesterday.

## 2017-10-25 NOTE — Telephone Encounter (Signed)
She called in c/o feeling tired and "woozy".  She got up yesterday morning and was on the toilet and felt "like my heart was going to beat out of my chest".  She  Checked her BP and pulse.  "My BP was fine but my pulse was 125".   "My pulse stayed around 114 most of yesterday".   "I looked up on line what to do for a fast heart rate and it said to rest".    "I've been resting all day yesterday except to go to the bathroom and eat".   "I feel like my heart is out of rhythm sometimes.   In 2015 they thought I was having a heart attack and sent me to the emergency room.   They said I wasn't having a heart attack but my heart was out of rhythm.  She does not feel like her heart is racing today but her BP was 115/81 and pulse 113 at 9:00am this morning.  "I'm just feeling so tired".  I have anxiety and when I'm anxious my bowels get loose.   I'm experiencing that too.  I have referred her to the ED.   She is going to the ED at [facility].   Her husband is going to take her.  I have routed a note to Dr. Charlett Blake making her aware of the situation and ED referral.    Reason for Disposition . Extra heart beats OR irregular heart beating  (i.e., "palpitations")  Answer Assessment - Initial Assessment Questions 1. DESCRIPTION: "Describe your dizziness."     I'm feeling woozy.   Got up yesterday went to toilet.   Felt like my heart was going to beat out of my chest.   Pulse  125.  BP was fine.  Pulse then 114 all day yesterday.   I stayed in bed all day yesterday.   Pulse 114, BP 117/76 this morning.   9:00am   115/81,  Pulse 113.   I feel so weak. 2. LIGHTHEADED: "Do you feel lightheaded?" (e.g., somewhat faint, woozy, weak upon standing)     I feel weak and woozy.    I feel like my heart is out of rhythm sometimes.   2015 my heart went out of rhythm.    3. VERTIGO: "Do you feel like either you or the room is spinning or tilting?" (i.e. vertigo)     No. 4. SEVERITY: "How bad is it?"  "Do you feel  like you are going to faint?" "Can you stand and walk?"   - MILD - walking normally   - MODERATE - interferes with normal activities (e.g., work, school)    - SEVERE - unable to stand, requires support to walk, feels like passing out now.      I've never fainted 5. ONSET:  "When did the dizziness begin?"     Yesterday morning when went to the toilet.   If I stay up I get worse.    I looked up on line and it said to rest if heart is out of thy hm. 6. AGGRAVATING FACTORS: "Does anything make it worse?" (e.g., standing, change in head position)     When I'm up it is worse. 7. HEART RATE: "Can you tell me your heart rate?" "How many beats in 15 seconds?"  (Note: not all patients can do this)       See above.   Denies chest discomfort or shortness of breath. 8. CAUSE: "What do you think  is causing the dizziness?"     I'm not dizzy. 9. RECURRENT SYMPTOM: "Have you had dizziness before?" If so, ask: "When was the last time?" "What happened that time?"     2015 my heart was out of thy um. 10. OTHER SYMPTOMS: "Do you have any other symptoms?" (e.g., fever, chest pain, vomiting, diarrhea, bleeding)       Sometimes a pain in my head but it goes away.   I get anxious and have loose bowels.   11. PREGNANCY: "Is there any chance you are pregnant?" "When was your last menstrual period?"       Not asked due to age.  Protocols used: DIZZINESS [facility]

## 2017-10-25 NOTE — H&P (Signed)
Jillian Hayes DGL:[id number] DOB: 14-Nov-1945 DOA: 10/25/2017     PCP: [name], MD   Outpatient Specialists:    CARDS:  Dr. Percival Spanish NEphrology:   Dr. Audie Clear    Patient arrived to ER on 10/25/17 at 1302  Patient coming from: home Lives  With family   Chief Complaint:  Chief Complaint  Patient presents with  . Weakness    HPI: Jillian Hayes is a 72 y.o. female with medical history significant of  CKD stage 4, HTN, Hyperparathyroidism, Anemia,     Presented with feeling tired and lightheaded for the past few days since she got up this morning she tried to use the bathroom and felt like her heart was racing she checked her vitals her blood pressure was fine but her heart rate was up to 125 she looked it up on the Internet and states that they feel her heart rate is high she should rest.  She try to rest all day but did not seem to help.  She stated in 2015 she was told that her heart rate was out of rhythm.  No Chest pain but mainly fatigue Been having some shortness of breath. No cough no URI symptoms no fevers or chills no history of alcohol abuse no nausea vomiting or diarrhea.  Regarding pertinent Chronic problems: Hyperparathyroidism with hypercalcemia CKD followed by [facility] being evaluated for possible kidney transplant  in 2015 she was evaluated by cardiology in Feb 2016 Normal stress nuclear study. While in ER: Was found to be in A. fib with RVR Given diltiazem IV bolus and then started on a drip The following Work up has been ordered so far:  Orders Placed This Encounter  Procedures  . DG Chest 2 View  . Basic metabolic panel  . CBC  . Troponin I  . Cardiac monitoring  . Saline Lock IV, Maintain IV access  . If O2 Sat <94% administer O2 at 2 liters/minute via nasal cannula  . Cardiac monitoring  . Consult to hospitalist  . Pulse oximetry, continuous  . ED EKG within 10 minutes  . EKG 12-Lead  . Place in observation (patient's expected length of  stay will be less than 2 midnights)      Following Medications were ordered in ER: Medications  diltiazem (CARDIZEM) 1 mg/mL load via infusion 20 mg (20 mg Intravenous Bolus from Bag 10/25/17 1440)    And  diltiazem (CARDIZEM) 100 mg in dextrose 5% 181mL (1 mg/mL) infusion (15 mg/hr Intravenous New Bag/Given 10/25/17 2053)  diltiazem (CARDIZEM) 25 MG/5ML injection (  Canceled Entry 10/25/17 1450)  diltiazem (CARDIZEM) 25 MG/5ML injection (  Canceled Entry 10/25/17 1450)    Significant initial  Findings: Abnormal Labs Reviewed  BASIC METABOLIC PANEL - Abnormal; Notable for the following components:      Result Value   Glucose, Bld 154 (*)    BUN 57 (*)    Creatinine, Ser 2.42 (*)    Calcium 10.7 (*)    GFR calc non Af Amer 19 (*)    GFR calc Af Amer 22 (*)    All other components within normal limits  CBC - Abnormal; Notable for the following components:   RDW 16.3 (*)    All other components within normal limits  TROPONIN I - Abnormal; Notable for the following components:   Troponin I 0.03 (*)    All other components within normal limits     Na  139 K 4.0  Cr  Stable,  Lab Results  Component Value Date   CREATININE 2.42 (H) 10/25/2017   CREATININE 2.38 (H) 04/06/2017   CREATININE 2.02 (H) 01/04/2017      WBC  7.6  HG/HCT  stable,      Component Value Date/Time   HGB 13.7 10/25/2017 1347   HGB 10.8 (L) 03/25/2016 1111   HCT 41.1 10/25/2017 1347   HCT 33.5 (L) 03/25/2016 1111       Troponin  0.03     UA not ordered   CXR -   NON acute    ECG:  Personally reviewed by me showing: HR : 129 Rhythm:  A.fib. W RVR,   nonspecific changes,   QTC 450   ED Triage Vitals  Enc Vitals Group     BP 10/25/17 1335 121/82     Pulse Rate 10/25/17 1336 70     Resp 10/25/17 1336 (!) 24     Temp 10/25/17 1339 97.7 F (36.5 C)     Temp Source 10/25/17 1339 Oral     SpO2 10/25/17 1336 97 %     Weight 10/25/17 1323 144 lb (65.3 kg)     Height 10/25/17 1323 5'  7" (1.702 m)     Head Circumference --      Peak Flow --      Pain Score 10/25/17 1323 0     Pain Loc --      Pain Edu? --      Excl. in [place]? --   TMAX(24)@       Latest  Blood pressure (!) 156/88, pulse (!) 120, temperature 98 F (36.7 C), temperature source Oral, resp. rate 20, height 5\' 7"  (1.702 m), weight 62.4 kg, SpO2 96 %.   Hospitalist was called for admission for A.fib w RVR   Review of Systems:  Pertinent positives include:  fatigue, shortness of breath at rest.    dyspnea on exertion    Constitutional:  No weight loss, night sweats, Fevers, chills, weight loss  HEENT:  No headaches, Difficulty swallowing,Tooth/dental problems,Sore throat,  No sneezing, itching, ear ache, nasal congestion, post nasal drip,  Cardio-vascular:  No chest pain, Orthopnea, PND, anasarca, dizziness, palpitations.no Bilateral lower extremity swelling  GI:  No heartburn, indigestion, abdominal pain, nausea, vomiting, diarrhea, change in bowel habits, loss of appetite, melena, blood in stool, hematemesis Resp:  no , No excess mucus, no productive cough, No non-productive cough, No coughing up of blood.No change in color of mucus.No wheezing. Skin:  no rash or lesions. No jaundice GU:  no dysuria, change in color of urine, no urgency or frequency. No straining to urinate.  No flank pain.  Musculoskeletal:  No joint pain or no joint swelling. No decreased range of motion. No back pain.  Psych:  No change in mood or affect. No depression or anxiety. No memory loss.  Neuro: no localizing neurological complaints, no tingling, no weakness, no double vision, no gait abnormality, no slurred speech, no confusion  All systems reviewed and apart from [place] all are negative  Past Medical History:   Past Medical History:  Diagnosis Date  . Advanced care planning/counseling discussion 06/10/2014   05/31/2014 patient presents copy of HCP and Living Will  . Anemia    iron deficiency  . Anxiety   .  Benign fundic gland polyps of stomach   . Bladder polyps 06/25/2010  . Chronic headaches 06/24/2010  . Chronic renal insufficiency, stage 4 (severe) ([place]) 11/09/2015  . Depression 1991  hospitalized  . History of chicken pox 06/25/2010  . Hypercalcemia 02/18/2014  . Hypertension   . Insomnia 06/24/2010  . Multiple chemical sensitivity syndrome 06/25/2010  . Proteinuria 02/18/2014  . Renal insufficiency 03/26/2011  . Valvular heart disease 04/28/2016      Past Surgical History:  Procedure Laterality Date  . cyst on left breast removed Left 1991   benign  . NASAL SEPTUM SURGERY  1986   rhinoplasty  . polyps on bladder removed  1972   benign  . TONSILLECTOMY  1962    Social History:  Ambulatory independently      reports that she has never smoked. She has never used smokeless tobacco. She reports that she does not drink alcohol or use drugs.     Family History:   Family History  Problem Relation Age of Onset  . Breast cancer Mother 65       left breast removed, 2010 lung  . Diabetes Mother   . Nephrolithiasis Father   . Heart attack Father 19       X 3  . Heart disease Father        smoker  . Hypertension Brother   . Allergic rhinitis Brother   . Melanoma Son 37       melanoma on leg removed  . Pancreatic cancer Maternal Grandmother   . Hypertension Paternal Grandmother   . Obesity Paternal Grandmother   . Heart attack Paternal Grandfather 47  . Diabetes Maternal Aunt        x 2  . Diabetes Maternal Uncle        x 3  . Asthma Neg Hx   . Eczema Neg Hx   . Urticaria Neg Hx   . Immunodeficiency Neg Hx   . Angioedema Neg Hx     Allergies: Allergies  Allergen Reactions  . Sulfa Antibiotics Nausea And Vomiting     Prior to Admission medications   Medication Sig Start Date End Date Taking? Authorizing Provider  ALFALFA PO Take 4,500 mg by mouth daily.      [provider]  amLODipine (NORVASC) 5 MG tablet TAKE 1 TABLET BY MOUTH TWICE DAILY 10/08/17    [name], MD  aspirin EC 81 MG tablet Take 1 tablet (81 mg total) by mouth daily. 07/27/16   [name], MD  b complex vitamins capsule Take 1 capsule by mouth daily.    [provider]  carvedilol (COREG) 3.125 MG tablet TAKE 1 TABLET BY MOUTH TWICE DAILY 08/03/17   [name], MD  Ferrous Fumarate (HEMOCYTE) 324 (106 Fe) MG TABS tablet Take 1 tablet (106 mg of iron total) by mouth daily. 11/12/15   [name], MD  Flaxseed, Linseed, (FLAX SEED OIL PO) Take 1 capsule 2 (two) times daily by mouth.     [provider]  hydrALAZINE (APRESOLINE) 10 MG tablet Take 1 tablet (10 mg total) by mouth 2 (two) times daily. 04/06/17   [name], MD  hydrALAZINE (APRESOLINE) 10 MG tablet TAKE 1 TABLET BY MOUTH THREE TIMES DAILY 07/06/17   [name], MD  L-GLUTAMINE PO Take 1 capsule by mouth daily.    [provider]  Probiotic Product (PROBIOTIC DAILY PO) Take 1 tablet by mouth daily.    [provider]  vitamin C (ASCORBIC ACID) 500 MG tablet Take 500 mg by mouth daily as needed.     [provider]   Physical Exam: Blood pressure (!) 156/88,  pulse (!) 120, temperature 98 F (36.7 C), temperature source Oral, resp. rate 20, height 5\' 7"  (1.702 m), weight 62.4 kg, SpO2 96 %. 1. General:  in No Acute distress   Chronically ill  -appearing 2. Psychological: Alert and  Oriented 3. Head/ENT:    Dry Mucous Membranes                          Head Non traumatic, neck supple                           Poor Dentition 4. SKIN:  decreased Skin turgor,  Skin clean Dry and intact no rash 5. Heart: Regular rate and rhythm no Murmur, no Rub or gallop 6. Lungs: , no wheezes or crackles   7. Abdomen: Soft, non-tender, Non distended  bowel sounds present 8. Lower extremities: no clubbing, cyanosis, or  edema 9. Neurologically Grossly intact, moving all 4 extremities equally   10. MSK: Normal range of motion   LABS:     Recent Labs  Lab  10/25/17 1347  WBC 7.6  HGB 13.7  HCT 41.1  MCV 85.1  PLT 678   Basic Metabolic Panel: Recent Labs  Lab 10/25/17 1347  NA 139  K 4.0  CL 103  CO2 23  GLUCOSE 154*  BUN 57*  CREATININE 2.42*  CALCIUM 10.7*      No results for input(s): AST, ALT, ALKPHOS, BILITOT, PROT, ALBUMIN in the last 168 hours. No results for input(s): LIPASE, AMYLASE in the last 168 hours. No results for input(s): AMMONIA in the last 168 hours.    HbA1C: No results for input(s): HGBA1C in the last 72 hours. CBG: No results for input(s): GLUCAP in the last 168 hours.    Urine analysis:     Cultures: No results found for: SDES, SPECREQUEST, CULT, REPTSTATUS   Radiological Exams on Admission: Dg Chest 2 View  Result Date: 10/25/2017 CLINICAL DATA:  Weakness since yesterday with tachycardia. EXAM: CHEST - 2 VIEW COMPARISON:  07/19/2016 FINDINGS: Lungs are adequately inflated and otherwise clear. Cardiomediastinal silhouette and remainder the exam is unchanged. IMPRESSION: No active cardiopulmonary disease. Electronically Signed   By: Marin Olp M.D.   On: 10/25/2017 14:49    Chart has been reviewed    Assessment/Plan  72 y.o. female with medical history significant of  CKD stage 4, HTN, Hyperparathyroidism, Anemia (currently improved),      Admitted for A.fib w RVR on diltiazem and heparin Drip  Present on Admission:   . Atrial fibrillation with RVR (HCC) -  - Admit to step down on Cardizem drip       CHA2D-VASC score 3       Will start on  Heparin,         Check TSH      Cycle cardiac enzymes      Obtain ECHO      Cardiology consult in AM  . Chronic renal insufficiency, stage 4 (severe) (HCC) - stable avoid nephrotoxic medications, patient to follow-up as an outpatient with her nephrologist to continue father evaluation for transplant . HTN (hypertension) -resume home medications when able . Hypercalcemia - Ca 10.7 chronic followed by [facility]al at [facility] . Hyperlipidemia we  will continue statin check lipid panel in a.m.   Other plan as per orders.  DVT prophylaxis:  Heparin Gtt   Code Status:  FULL CODE  as per patient  I had personally discussed CODE STATUS with patient   Family Communication:   Family not  at  Bedside    Disposition Plan:      To home once workup is complete and patient is stable                     Would benefit from PT/OT eval prior to DC  Ordered                                       Consults called: email cardiology  Admission status: Obs    Level of care      SDU tele indefinitely please discontinue once patient no longer qualifies     Bostyn Kunkler 10/25/2017, 10:20 PM    [facility]  Pager [phone]   after 2 AM please page floor coverage PA If 7AM-7PM, please contact the day team taking care of the patient  [email or web address]  Password [email or web address]

## 2017-10-25 NOTE — ED Notes (Signed)
Called [facility] to re-page hospitalist

## 2017-10-25 NOTE — Progress Notes (Signed)
ANTICOAGULATION CONSULT NOTE - Initial Consult  Pharmacy Consult for heparin Indication: atrial fibrillation  Allergies  Allergen Reactions  . Sulfa Antibiotics Nausea And Vomiting    Patient Measurements: Height: 5\' 7"  (170.2 cm) Weight: 137 lb 9.1 oz (62.4 kg) IBW/kg (Calculated) : 61.6 Heparin Dosing Weight: 65kg  Vital Signs: Temp: 98 F (36.7 C) (09/23 2052) Temp Source: Oral (09/23 2052) BP: 156/88 (09/23 2052) Pulse Rate: 120 (09/23 2052)  Labs: Recent Labs    10/25/17 1347  HGB 13.7  HCT 41.1  PLT 270  CREATININE 2.42*  TROPONINI 0.03*    Estimated Creatinine Clearance: 20.7 mL/min (A) (by C-G formula based on SCr of 2.42 mg/dL (H)).   Medical History: Past Medical History:  Diagnosis Date  . Advanced care planning/counseling discussion 06/10/2014   05/31/2014 patient presents copy of HCP and Living Will  . Anemia    iron deficiency  . Anxiety   . Benign fundic gland polyps of stomach   . Bladder polyps 06/25/2010  . Chronic headaches 06/24/2010  . Chronic renal insufficiency, stage 4 (severe) ([place]) 11/09/2015  . Depression 1991   hospitalized  . History of chicken pox 06/25/2010  . Hypercalcemia 02/18/2014  . Hypertension   . Insomnia 06/24/2010  . Multiple chemical sensitivity syndrome 06/25/2010  . Proteinuria 02/18/2014  . Renal insufficiency 03/26/2011  . Valvular heart disease 04/28/2016   Assessment: 72 year old female admitted for afib with rvr to start IV heparin. CBC within normal limits.   Follow up transitioning to oral anticoagulation when able.   Goal of Therapy:  Heparin level 0.3-0.7 units/ml Monitor platelets by anticoagulation protocol: Yes   Plan:  Give 3500 units bolus x 1 Start heparin infusion at 850 units/hr Check anti-Xa level in 8 hours and daily while on heparin Continue to monitor H&H and platelets  [name] Hearing PharmD., BCPS Clinical Pharmacist 10/25/2017 10:16 PM

## 2017-10-26 ENCOUNTER — Observation Stay (HOSPITAL_COMMUNITY): Payer: Medicare HMO

## 2017-10-26 DIAGNOSIS — Z7982 Long term (current) use of aspirin: Secondary | ICD-10-CM | POA: Diagnosis not present

## 2017-10-26 DIAGNOSIS — Z79899 Other long term (current) drug therapy: Secondary | ICD-10-CM | POA: Diagnosis not present

## 2017-10-26 DIAGNOSIS — I480 Paroxysmal atrial fibrillation: Secondary | ICD-10-CM | POA: Diagnosis not present

## 2017-10-26 DIAGNOSIS — R69 Illness, unspecified: Secondary | ICD-10-CM | POA: Diagnosis not present

## 2017-10-26 DIAGNOSIS — F419 Anxiety disorder, unspecified: Secondary | ICD-10-CM | POA: Diagnosis present

## 2017-10-26 DIAGNOSIS — R531 Weakness: Secondary | ICD-10-CM | POA: Diagnosis present

## 2017-10-26 DIAGNOSIS — I361 Nonrheumatic tricuspid (valve) insufficiency: Secondary | ICD-10-CM

## 2017-10-26 DIAGNOSIS — Z882 Allergy status to sulfonamides status: Secondary | ICD-10-CM | POA: Diagnosis not present

## 2017-10-26 DIAGNOSIS — N184 Chronic kidney disease, stage 4 (severe): Secondary | ICD-10-CM | POA: Diagnosis not present

## 2017-10-26 DIAGNOSIS — Z8719 Personal history of other diseases of the digestive system: Secondary | ICD-10-CM | POA: Diagnosis not present

## 2017-10-26 DIAGNOSIS — I4891 Unspecified atrial fibrillation: Secondary | ICD-10-CM

## 2017-10-26 DIAGNOSIS — E213 Hyperparathyroidism, unspecified: Secondary | ICD-10-CM | POA: Diagnosis not present

## 2017-10-26 DIAGNOSIS — Z803 Family history of malignant neoplasm of breast: Secondary | ICD-10-CM | POA: Diagnosis not present

## 2017-10-26 DIAGNOSIS — Z808 Family history of malignant neoplasm of other organs or systems: Secondary | ICD-10-CM | POA: Diagnosis not present

## 2017-10-26 DIAGNOSIS — I350 Nonrheumatic aortic (valve) stenosis: Secondary | ICD-10-CM | POA: Diagnosis not present

## 2017-10-26 DIAGNOSIS — I10 Essential (primary) hypertension: Secondary | ICD-10-CM | POA: Diagnosis not present

## 2017-10-26 DIAGNOSIS — E782 Mixed hyperlipidemia: Secondary | ICD-10-CM | POA: Diagnosis not present

## 2017-10-26 DIAGNOSIS — F329 Major depressive disorder, single episode, unspecified: Secondary | ICD-10-CM | POA: Diagnosis present

## 2017-10-26 DIAGNOSIS — E785 Hyperlipidemia, unspecified: Secondary | ICD-10-CM | POA: Diagnosis not present

## 2017-10-26 DIAGNOSIS — Z800 Family history of malignant neoplasm of digestive organs: Secondary | ICD-10-CM | POA: Diagnosis not present

## 2017-10-26 DIAGNOSIS — I129 Hypertensive chronic kidney disease with stage 1 through stage 4 chronic kidney disease, or unspecified chronic kidney disease: Secondary | ICD-10-CM | POA: Diagnosis not present

## 2017-10-26 DIAGNOSIS — Z9889 Other specified postprocedural states: Secondary | ICD-10-CM | POA: Diagnosis not present

## 2017-10-26 DIAGNOSIS — Z8249 Family history of ischemic heart disease and other diseases of the circulatory system: Secondary | ICD-10-CM | POA: Diagnosis not present

## 2017-10-26 DIAGNOSIS — Z8679 Personal history of other diseases of the circulatory system: Secondary | ICD-10-CM | POA: Diagnosis not present

## 2017-10-26 DIAGNOSIS — I517 Cardiomegaly: Secondary | ICD-10-CM | POA: Diagnosis not present

## 2017-10-26 DIAGNOSIS — D631 Anemia in chronic kidney disease: Secondary | ICD-10-CM | POA: Diagnosis not present

## 2017-10-26 DIAGNOSIS — I481 Persistent atrial fibrillation: Secondary | ICD-10-CM | POA: Diagnosis not present

## 2017-10-26 DIAGNOSIS — Z833 Family history of diabetes mellitus: Secondary | ICD-10-CM | POA: Diagnosis not present

## 2017-10-26 DIAGNOSIS — Z8619 Personal history of other infectious and parasitic diseases: Secondary | ICD-10-CM | POA: Diagnosis not present

## 2017-10-26 LAB — BASIC METABOLIC PANEL
ANION GAP: 9 (ref 5–15)
BUN: 50 mg/dL — ABNORMAL HIGH (ref 8–23)
CHLORIDE: 110 mmol/L (ref 98–111)
CO2: 22 mmol/L (ref 22–32)
Calcium: 10.3 mg/dL (ref 8.9–10.3)
Creatinine, Ser: 2.21 mg/dL — ABNORMAL HIGH (ref 0.44–1.00)
GFR calc Af Amer: 25 mL/min — ABNORMAL LOW (ref >60)
GFR calc non Af Amer: 21 mL/min — ABNORMAL LOW (ref >60)
Glucose, Bld: 94 mg/dL (ref 70–99)
Potassium: 3.7 mmol/L (ref 3.5–5.1)
Sodium: 141 mmol/L (ref 135–145)

## 2017-10-26 LAB — ECHOCARDIOGRAM COMPLETE
Height: 67 in
WEIGHTICAEL: 2209.6 [oz_av]

## 2017-10-26 LAB — CBC
HCT: 40.3 % (ref 36.0–46.0)
HEMOGLOBIN: 12.6 g/dL (ref 12.0–15.0)
MCH: 26.8 pg (ref 26.0–34.0)
MCHC: 31.3 g/dL (ref 30.0–36.0)
MCV: 85.7 fL (ref 78.0–100.0)
Platelets: 241 10*3/uL (ref 150–400)
RBC: 4.7 MIL/uL (ref 3.87–5.11)
RDW: 15.6 % — ABNORMAL HIGH (ref 11.5–15.5)
WBC: 8 10*3/uL (ref 4.0–10.5)

## 2017-10-26 LAB — PROTIME-INR
INR: 1.09
Prothrombin Time: 14.1 seconds (ref 11.4–15.2)

## 2017-10-26 LAB — LIPID PANEL
Cholesterol: 192 mg/dL (ref 0–200)
HDL: 79 mg/dL (ref >40)
LDL Cholesterol: 104 mg/dL — ABNORMAL HIGH (ref 0–99)
Total CHOL/HDL Ratio: 2.4 RATIO
Triglycerides: 43 mg/dL (ref <150)
VLDL: 9 mg/dL (ref 0–40)

## 2017-10-26 LAB — HEPARIN LEVEL (UNFRACTIONATED)
Heparin Unfractionated: 0.55 IU/mL (ref 0.30–0.70)
Heparin Unfractionated: 0.5 IU/mL (ref 0.30–0.70)

## 2017-10-26 LAB — TROPONIN I
Troponin I: <0.03 ng/mL (ref <0.03)
Troponin I: <0.03 ng/mL (ref <0.03)

## 2017-10-26 MED ORDER — WARFARIN SODIUM 5 MG PO TABS
5.0000 mg | ORAL_TABLET | Freq: Once | ORAL | Status: AC
  Administered 2017-10-26: 5 mg via ORAL
  Filled 2017-10-26: qty 1

## 2017-10-26 MED ORDER — DILTIAZEM HCL ER COATED BEADS 120 MG PO CP24
120.0000 mg | ORAL_CAPSULE | Freq: Every day | ORAL | Status: DC
  Administered 2017-10-26 – 2017-10-27 (×2): 120 mg via ORAL
  Filled 2017-10-26 (×2): qty 1

## 2017-10-26 MED ORDER — SODIUM CHLORIDE 0.9 % IV SOLN
INTRAVENOUS | Status: DC
  Administered 2017-10-27: 01:00:00 via INTRAVENOUS

## 2017-10-26 MED ORDER — INFLUENZA VAC SPLIT HIGH-DOSE 0.5 ML IM SUSY
0.5000 mL | PREFILLED_SYRINGE | INTRAMUSCULAR | Status: DC
  Filled 2017-10-26: qty 0.5

## 2017-10-26 MED ORDER — WARFARIN - PHARMACIST DOSING INPATIENT: Freq: Every day | Status: DC

## 2017-10-26 MED ORDER — DILTIAZEM HCL-DEXTROSE 100-5 MG/100ML-% IV SOLN (PREMIX)
5.0000 mg/h | INTRAVENOUS | Status: DC
  Administered 2017-10-26: 15 mg/h via INTRAVENOUS
  Filled 2017-10-26: qty 100

## 2017-10-26 MED ORDER — PATIENT'S GUIDE TO USING COUMADIN BOOK
Freq: Once | Status: AC
  Administered 2017-10-26: 20:00:00
  Filled 2017-10-26: qty 1

## 2017-10-26 MED ORDER — PNEUMOCOCCAL VAC POLYVALENT 25 MCG/0.5ML IJ INJ: 0.5000 mL | INJECTION | INTRAMUSCULAR | Status: DC

## 2017-10-26 NOTE — Progress Notes (Signed)
I went up to discuss the cardioversion with the patient around 2pm and she was in sinus rhythm.  Will start coumadin tonight Will continue Coreg Will start Cardizem CD 120 mg daily now and d/c Cardizem drip in one hour Continue heparin drip tonight.    Lauree Chandler 10/26/2017 2:12 PM

## 2017-10-26 NOTE — Progress Notes (Signed)
  Echocardiogram 2D Echocardiogram has been performed.  Jillian Hayes 10/26/2017, 9:37 AM

## 2017-10-26 NOTE — Plan of Care (Signed)

## 2017-10-26 NOTE — Evaluation (Signed)
Physical Therapy Evaluation/Discharge  Patient Details Name: Jillian Hayes MRN: [id number] DOB: [birth year] Today's Date: 10/26/2017   History of Present Illness  Pt admitted with A-fib 9/23. PMHx: HTN, CKD-IV, heart valve disease, hyperthyroid, anemia, and depression.   Clinical Impression  Pt pleasant on arrival, reporting had already walked earlier with nursing. Pt independent with bed mobility and min guard for transfers and ambulation for safety with no AD. Pt reports medicine makes her head feel "funny" but denied any dizziness. Pt educated on HEP, to wait 10 seconds after postural change to ensure no dizziness prior to continuing mobility, and to have husband around at first for safety. Pt also encouraged to continue mobility with nursing staff. Pt is quickly approaching baseline and I do not anticipate any further acute therapy needs.    Pt HR 64-80 throughout session.     Follow Up Recommendations No PT follow up    Equipment Recommendations  None recommended by PT    Recommendations for Other Services       Precautions / Restrictions Precautions Precautions: Fall Restrictions Weight Bearing Restrictions: No      Mobility  Bed Mobility Overal bed mobility: Independent                Transfers Overall transfer level: Needs assistance     Sit to Stand: Supervision         General transfer comment: pt self guarding with transfers with decr speed. pt reports not feeling back to basekline  Ambulation/Gait Ambulation/Gait assistance: Min guard Gait Distance (Feet): 250 Feet Assistive device: None Gait Pattern/deviations: Step-through pattern;Decreased stride length Gait velocity: decreased  Gait velocity interpretation: >2.62 ft/sec, indicative of community ambulatory General Gait Details: overall steady with a few minor loss of balance episodes, able to self correct.   Stairs            Wheelchair Mobility    Modified Rankin (Stroke Patients  Only)       Balance Overall balance assessment: Mild deficits observed, not formally tested   Sitting balance-Leahy Scale: Normal       Standing balance-Leahy Scale: Good               High level balance activites: Turns;Sudden stops;Head turns;Side stepping High Level Balance Comments: pt with small loss of balance when stepping around obstacle, min guard for challenge.              Pertinent Vitals/Pain Pain Assessment: No/denies pain    Home Living Family/patient expects to be discharged to:: Private residence Living Arrangements: Spouse/significant other;Children Available Help at Discharge: Family Type of Home: House Home Access: Level entry     Home Layout: One level Home Equipment: Cane - single point      Prior Function Level of Independence: Independent         Comments: pt independent with all ADLs, driving, likes to go shopping. 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      Hand Dominance   Dominant Hand: Right    Extremity/Trunk Assessment   Upper Extremity Assessment Upper Extremity Assessment: Overall WFL for tasks assessed    Lower Extremity Assessment Lower Extremity Assessment: Generalized weakness    Cervical / Trunk Assessment Cervical / Trunk Assessment: Normal  Communication   Communication: No difficulties  Cognition Arousal/Alertness: Awake/alert Behavior During Therapy: WFL for tasks assessed/performed Overall Cognitive Status: Within Functional Limits for tasks assessed  General Comments      Exercises     Assessment/Plan    PT Assessment Patent does not need any further PT services  PT Problem List         PT Treatment Interventions      PT Goals (Current goals can be found in the Care Plan section)  Acute Rehab PT Goals Patient Stated Goal: return home  PT Goal Formulation: With patient Time For Goal Achievement:  11/09/17 Potential to Achieve Goals: Good    Frequency     Barriers to discharge        Co-evaluation               AM-PAC PT "6 Clicks" Daily Activity  Outcome Measure Difficulty turning over in bed (including adjusting bedclothes, sheets and blankets)?: None Difficulty moving from lying on back to sitting on the side of the bed? : None Difficulty sitting down on and standing up from a chair with arms (e.g., wheelchair, bedside commode, etc,.)?: A Little Help needed moving to and from a bed to chair (including a wheelchair)?: None Help needed walking in hospital room?: None Help needed climbing 3-5 steps with a railing? : None 6 Click Score: 23    End of Session Equipment Utilized During Treatment: Gait belt Activity Tolerance: Patient tolerated treatment well Patient left: in chair;with call bell/phone within reach Nurse Communication: Mobility status PT Visit Diagnosis: Other abnormalities of gait and mobility (R26.89);Muscle weakness (generalized) (M62.81)    Time: 2952-8413 PT Time Calculation (min) (ACUTE ONLY): 31 min   Charges:   PT Evaluation $PT Eval Low Complexity: [place], [place]  Acute Rehab [phone]   Samuella Bruin 10/26/2017, 4:44 PM

## 2017-10-26 NOTE — Progress Notes (Signed)
[place] TEAM 1 - Stepdown/ICU TEAM  Jillian Hayes  OHY:[id number] DOB: [birth year] DOA: 10/25/2017 PCP: [name], MD    Brief Narrative:  72 y.o. female with a hx of CKD stage 4, HTN, Hyperparathyroidism, and Anemia who presented w/ a few days of lethargy and and lightheadedness. After feeling palpitations she checked her HR and found it to be 125.  She presented to the ED, and there she was found to be in Afib w/ RVR.    Subjective: Resting comfortably in bedside chair.  Has converted back to NSR.  Denies cp, sob, n/v, or abdom pain.    Assessment & Plan:  Newly diagnosed Afib w/ RVR Spontaneously converted back to Afib - Cards is following - cont IV heparin and warfarin to begin - cont coreg and add oral cardizem while weaning off cardizem gtt - TSH is normal - check Mg in AM   CKD Stage 4 crt stable at this time   Recent Labs  Lab 10/25/17 1347 10/26/17 0325  CREATININE 2.42* 2.21*   HTN BP currently controlled  Hypercalcemia   HLD Cont statin   DVT prophylaxis: IV heparin > warfarin  Code Status: FULL CODE Family Communication: no family present at time of exam  Disposition Plan:   Consultants:  Cardiology  Antimicrobials:  none   Objective: Blood pressure 134/60, pulse 63, temperature 97.9 F (36.6 C), temperature source Oral, resp. rate 16, height 5\' 7"  (1.702 m), weight 62.6 kg, SpO2 98 %.  Intake/Output Summary (Last 24 hours) at 10/26/2017 1826 Last data filed at 10/26/2017 1534 Gross per 24 hour  Intake 2013.31 ml  Output 600 ml  Net 1413.31 ml   Filed Weights   10/25/17 1323 10/25/17 2052 10/26/17 0457  Weight: 65.3 kg 62.4 kg 62.6 kg    Examination: General: No acute respiratory distress Lungs: Clear to auscultation bilaterally without wheezes or crackles Cardiovascular: Regular rate and rhythm without murmur gallop or rub normal S1 and S2 Abdomen: Nontender, nondistended, soft, bowel sounds positive, no rebound, no ascites, no  appreciable mass Extremities: No significant cyanosis, clubbing, or edema bilateral lower extremities  CBC: Recent Labs  Lab 10/25/17 1347 10/26/17 0325  WBC 7.6 8.0  HGB 13.7 12.6  HCT 41.1 40.3  MCV 85.1 85.7  PLT 270 948   Basic Metabolic Panel: Recent Labs  Lab 10/25/17 1347 10/26/17 0325  NA 139 141  K 4.0 3.7  CL 103 110  CO2 23 22  GLUCOSE 154* 94  BUN 57* 50*  CREATININE 2.42* 2.21*  CALCIUM 10.7* 10.3   GFR: Estimated Creatinine Clearance: 22.7 mL/min (A) (by C-G formula based on SCr of 2.21 mg/dL (H)).  Liver Function Tests: No results for input(s): AST, ALT, ALKPHOS, BILITOT, PROT, ALBUMIN in the last 168 hours. No results for input(s): LIPASE, AMYLASE in the last 168 hours. No results for input(s): AMMONIA in the last 168 hours.  Coagulation Profile: Recent Labs  Lab 10/25/17 2158 10/26/17 0325  INR 0.96 1.09    Cardiac Enzymes: Recent Labs  Lab 10/25/17 1347 10/25/17 2158 10/26/17 0325 10/26/17 0951  TROPONINI 0.03* <0.03 <0.03 <0.03    HbA1C: Hgb A1c MFr Bld  Date/Time Value Ref Range Status  10/25/2017 09:58 PM 5.7 (H) 4.8 - 5.6 % Final    Comment:    (NOTE) Pre diabetes:          5.7%-6.4% Diabetes:              >6.4% Glycemic control for   <  7.0% adults with diabetes     Scheduled Meds: . carvedilol  3.125 mg Oral BID  . diltiazem  120 mg Oral Daily  . [START ON 10/27/2017] Influenza vac split quadrivalent PF  0.5 mL Intramuscular Tomorrow-1000  . patient's guide to using coumadin book   Does not apply Once  . [START ON 10/27/2017] pneumococcal 23 valent vaccine  0.5 mL Intramuscular Tomorrow-1000  . Warfarin - Pharmacist Dosing Inpatient   Does not apply q1800   Continuous Infusions: . sodium chloride 75 mL/hr at 10/26/17 0700  . heparin 850 Units/hr (10/26/17 0700)     LOS: 0 days   Cherene Altes, MD [facility] Office  [phone] Pager - Text Page per Amion  If 7PM-7AM, please contact night-coverage  per Amion 10/26/2017, 6:26 PM

## 2017-10-26 NOTE — Progress Notes (Signed)
[place] for heparin Indication: atrial fibrillation  Allergies  Allergen Reactions  . Sulfa Antibiotics Nausea And Vomiting    Patient Measurements: Height: 5\' 7"  (170.2 cm) Weight: 138 lb 1.6 oz (62.6 kg) IBW/kg (Calculated) : 61.6 Heparin Dosing Weight: 65kg  Vital Signs: Temp: 97.9 F (36.6 C) (09/24 0800) Temp Source: Oral (09/24 0800) BP: 138/81 (09/24 1140) Pulse Rate: 118 (09/24 1140)  Labs: Recent Labs    10/25/17 1347 10/25/17 2158 10/26/17 0325 10/26/17 0704 10/26/17 0951  HGB 13.7  --  12.6  --   --   HCT 41.1  --  40.3  --   --   PLT 270  --  241  --   --   APTT  --  20*  --   --   --   LABPROT  --  12.7 14.1  --   --   INR  --  0.96 1.09  --   --   HEPARINUNFRC  --   --   --  0.55  --   CREATININE 2.42*  --  2.21*  --   --   TROPONINI 0.03* <0.03 <0.03  --  <0.03    Estimated Creatinine Clearance: 22.7 mL/min (A) (by C-G formula based on SCr of 2.21 mg/dL (H)).   Medical History: Past Medical History:  Diagnosis Date  . Advanced care planning/counseling discussion 06/10/2014   05/31/2014 patient presents copy of HCP and Living Will  . Anemia    iron deficiency  . Anxiety   . Benign fundic gland polyps of stomach   . Bladder polyps 06/25/2010  . Chronic headaches 06/24/2010  . Chronic renal insufficiency, stage 4 (severe) ([place]) 11/09/2015  . Depression 1991   hospitalized  . History of chicken pox 06/25/2010  . Hypercalcemia 02/18/2014  . Hypertension   . Insomnia 06/24/2010  . Multiple chemical sensitivity syndrome 06/25/2010  . Proteinuria 02/18/2014  . Renal insufficiency 03/26/2011  . Valvular heart disease 04/28/2016   Assessment: 72 year old female admitted for afib/RVR (CHADSVASC=3)  on IV heparin.  -initial level at goal, CBC stable  Goal of Therapy:  Heparin level 0.3-0.7 units/ml Monitor platelets by anticoagulation protocol: Yes   Plan:  -no heparin changes needed -Will recheck a heparin level  in 8 hrs -daily heparin level and CBC  Hildred Laser, PharmD Clinical Pharmacist Please check Amion for pharmacy contact number   Addendum -adding warfarin (baseline INR= 0.96 on 9/23)  Plan -Coumadin 5mg  po today -Daily PT/INR  Hildred Laser, PharmD Clinical Pharmacist Please check Amion for pharmacy contact number

## 2017-10-26 NOTE — Consult Note (Signed)
Cardiology Consultation:   Patient ID: Jillian Hayes MRN: [id number]; DOB: April 16, 1945  Admit date: 10/25/2017 Date of Consult: 10/26/2017  Primary Care Provider: [name], MD Primary Cardiologist: No primary care provider on file. Jillian Hayes   Patient Profile:   Jillian Hayes is a 72 y.o. female with a hx of HTN, iron deficiency anemia, chronic kidney disease-stage 4, mild to moderate aortic insufficiency and depression who is being seen today for the evaluation of atrial fibrillation with rapid ventricular response at the request of Dr. Thereasa Hayes.  History of Present Illness:   [name] is a 71 yo female with history of HTN, iron deficiency anemia, hyperparathyroidism, chronic kidney disease-stage 4 and depression who is admitted to [facility] on 10/25/17 with c/o fatigue, weakness and dizziness for several days. She was found to be in atrial fibrillation with RVR. She has been treated with IV Cardizem. She has been started on IV heparin. She reports improvement in her energy level since admission but still feels overall washed out. No chest pain or dyspnea. No weight gain or lower extremity edema. HR is currently 100-120 bpm on 15 mg IV Cardizem per hour. Troponin negative. She is known to have stage 4 chronic kidney disease. Creatinine 2.2 today with GFR 21. EKG on admission shows atrial fibrillation with RVR, non-specific ST and T wave abnormality with poor R wave progression in the precordial leads. Echo today shows normal LV systolic function with biatrial enlargement.   Past Medical History:  Diagnosis Date  . Advanced care planning/counseling discussion 06/10/2014   05/31/2014 patient presents copy of HCP and Living Will  . Anemia    iron deficiency  . Anxiety   . Benign fundic gland polyps of stomach   . Bladder polyps 06/25/2010  . Chronic headaches 06/24/2010  . Chronic renal insufficiency, stage 4 (severe) ([place]) 11/09/2015  . Depression 1991   hospitalized  . History of chicken  pox 06/25/2010  . Hypercalcemia 02/18/2014  . Hypertension   . Insomnia 06/24/2010  . Multiple chemical sensitivity syndrome 06/25/2010  . Proteinuria 02/18/2014  . Renal insufficiency 03/26/2011  . Valvular heart disease 04/28/2016    Past Surgical History:  Procedure Laterality Date  . cyst on left breast removed Left 1991   benign  . NASAL SEPTUM SURGERY  1986   rhinoplasty  . polyps on bladder removed  1972   benign  . TONSILLECTOMY  1962     Home Medications:  Prior to Admission medications   Medication Sig Start Date End Date Taking? Authorizing Provider  acetaminophen (TYLENOL) 500 MG tablet Take 500 mg by mouth every 6 (six) hours as needed for headache (pain).   Yes [provider]  Alfalfa 250 MG TABS Take 1,500 mg by mouth 3 (three) times daily.   Yes [provider]  amLODipine (NORVASC) 5 MG tablet TAKE 1 TABLET BY MOUTH TWICE DAILY Patient taking differently: Take 5 mg by mouth 2 (two) times daily.  10/08/17  Yes Jillian Lukes, MD  b complex vitamins capsule Take 1 capsule by mouth 2 (two) times daily with a meal.    Yes [provider]  carvedilol (COREG) 3.125 MG tablet TAKE 1 TABLET BY MOUTH TWICE DAILY Patient taking differently: Take 3.125 mg by mouth 2 (two) times daily.  08/03/17  Yes Jillian Lukes, MD  Flaxseed, Linseed, (FLAX SEED OIL PO) Take 1 capsule 2 (two) times daily by mouth.    Yes [provider]  hydrALAZINE (APRESOLINE) 10 MG  tablet TAKE 1 TABLET BY MOUTH THREE TIMES DAILY Patient taking differently: Take 10 mg by mouth 2 (two) times daily.  07/06/17  Yes Jillian Lukes, MD  IRON PO Take 15 mg by mouth daily. [place] product   Yes [provider]  L-GLUTAMINE PO Take 1 capsule by mouth daily after supper.    Yes [provider]  Probiotic Product (PROBIOTIC DAILY PO) Take 1 tablet by mouth daily with lunch.    Yes [provider]  vitamin C (ASCORBIC ACID) 500 MG tablet Take 500 mg by mouth  daily as needed (immune system boost).    Yes [provider]  VITAMIN E PO Take 1 capsule by mouth daily with lunch.   Yes [provider]  aspirin EC 81 MG tablet Take 1 tablet (81 mg total) by mouth daily. Patient not taking: Reported on 10/25/2017 07/27/16   Jillian Lukes, MD  Ferrous Fumarate (HEMOCYTE) 324 (106 Fe) MG TABS tablet Take 1 tablet (106 mg of iron total) by mouth daily. Patient not taking: Reported on 10/25/2017 11/12/15   Jillian Lukes, MD  hydrALAZINE (APRESOLINE) 10 MG tablet Take 1 tablet (10 mg total) by mouth 2 (two) times daily. Patient not taking: Reported on 10/25/2017 04/06/17   Jillian Lukes, MD    Inpatient Medications: Scheduled Meds: . carvedilol  3.125 mg Oral BID  . [START ON 10/27/2017] Influenza vac split quadrivalent PF  0.5 mL Intramuscular Tomorrow-1000  . [START ON 10/27/2017] pneumococcal 23 valent vaccine  0.5 mL Intramuscular Tomorrow-1000   Continuous Infusions: . sodium chloride 75 mL/hr at 10/25/17 2259  . diltiazem (CARDIZEM) infusion 15 mg/hr (10/26/17 0410)  . heparin 850 Units/hr (10/25/17 2301)   PRN Meds: acetaminophen, ondansetron (ZOFRAN) IV  Allergies:    Allergies  Allergen Reactions  . Sulfa Antibiotics Nausea And Vomiting    Social History:   Social History   Socioeconomic History  . Marital status: Married    Spouse name: Not on file  . Number of children: 2  . Years of education: Not on file  . Highest education level: Not on file  Occupational History  . Occupation: retired  Scientific laboratory technician  . Financial resource strain: Not on file  . Food insecurity:    Worry: Never true    Inability: Never true  . Transportation needs:    Medical: No    Non-medical: No  Tobacco Use  . Smoking status: Never Smoker  . Smokeless tobacco: Never Used  Substance and Sexual Activity  . Alcohol use: No  . Drug use: No  . Sexual activity: Yes    Partners: Male    Comment: lives with husband, wears with husband    Lifestyle  . Physical activity:    Days per week: Not on file    Minutes per session: Not on file  . Stress: Not on file  Relationships  . Social connections:    Talks on phone: Not on file    Gets together: Not on file    Attends religious service: Not on file    Active member of club or organization: Not on file    Attends meetings of clubs or organizations: Not on file    Relationship status: Not on file  . Intimate partner violence:    Fear of current or ex partner: No    Emotionally abused: No    Physically abused: No    Forced sexual activity: No  Other Topics Concern  .  Not on file  Social History Narrative   Lives with husband.      Family History:    Family History  Problem Relation Age of Onset  . Breast cancer Mother 40       left breast removed, 2010 lung  . Diabetes Mother   . Nephrolithiasis Father   . Heart attack Father 53       X 3  . Heart disease Father        smoker  . Hypertension Brother   . Allergic rhinitis Brother   . Melanoma Son 37       melanoma on leg removed  . Pancreatic cancer Maternal Grandmother   . Hypertension Paternal Grandmother   . Obesity Paternal Grandmother   . Heart attack Paternal Grandfather 57  . Diabetes Maternal Aunt        x 2  . Diabetes Maternal Uncle        x 3  . Asthma Neg Hx   . Eczema Neg Hx   . Urticaria Neg Hx   . Immunodeficiency Neg Hx   . Angioedema Neg Hx      ROS:  Please see the history of present illness.  All other ROS reviewed and negative.     Physical Exam/Data:   Vitals:   10/26/17 0457 10/26/17 0458 10/26/17 0800 10/26/17 1140  BP:  124/77 (!) 141/73 138/81  Pulse:  97  (!) 118  Resp:   16   Temp:  98.2 F (36.8 C) 97.9 F (36.6 C)   TempSrc:  Oral Oral   SpO2:  94%    Weight: 62.6 kg     Height:        Intake/Output Summary (Last 24 hours) at 10/26/2017 1219 Last data filed at 10/26/2017 0700 Gross per 24 hour  Intake 1025.74 ml  Output 300 ml  Net 725.74 ml   Filed  Weights   10/25/17 1323 10/25/17 2052 10/26/17 0457  Weight: 65.3 kg 62.4 kg 62.6 kg   Body mass index is 21.63 kg/m.  General:  Well nourished, well developed, in no acute distress HEENT: normal Lymph: no adenopathy Neck: no JVD Endocrine:  No thryomegaly Vascular: No carotid bruits; FA pulses 2+ bilaterally without bruits  Cardiac:  Irreg irreg, tachy. No loud murmurs.  Lungs:  clear to auscultation bilaterally, no wheezing, rhonchi or rales  Abd: soft, nontender, no hepatomegaly  Ext: no edema Musculoskeletal:  No deformities, BUE and BLE strength normal and equal Skin: warm and dry  Neuro:  CNs 2-12 intact, no focal abnormalities noted Psych:  Normal affect   EKG:  The EKG was personally reviewed and demonstrates:   atrial fibrillation with RVR, non-specific ST and T wave abnormality with poor R wave progression in the precordial leads.  Telemetry:  Telemetry was personally reviewed and demonstrates:  Atrial fib with rates 100-120 bpm  Relevant CV Studies: Echo 10/26/17: Left ventricle: The cavity size was normal. Systolic function was   normal. The estimated ejection fraction was in the range of 60%   to 65%. Wall motion was normal; there were no regional wall   motion abnormalities. The study was not technically sufficient to   allow evaluation of LV diastolic dysfunction due to atrial   fibrillation. - Aortic valve: Trileaflet; mildly thickened, mildly calcified   leaflets. There was moderate regurgitation. Valve area (VTI):   2.91 cm^2. Valve area (Vmax): 2.34 cm^2. Valve area (Vmean): 2.67   cm^2. - Left atrium: The atrium was  mildly dilated. - Right atrium: The atrium was moderately dilated. - Tricuspid valve: There was mild-moderate regurgitation. - Pulmonic valve: There was trivial regurgitation. - Pulmonary arteries: Systolic pressure could not be accurately   estimated. - Inferior vena cava: The vessel was mildly dilated. The   respirophasic diameter changes  were in the normal range (>= 50%). - Pericardium, extracardiac: A trivial pericardial effusion was   identified posterior to the heart.  Laboratory Data:  Chemistry Recent Labs  Lab 10/25/17 1347 10/26/17 0325  NA 139 141  K 4.0 3.7  CL 103 110  CO2 23 22  GLUCOSE 154* 94  BUN 57* 50*  CREATININE 2.42* 2.21*  CALCIUM 10.7* 10.3  GFRNONAA 19* 21*  GFRAA 22* 25*  ANIONGAP 13 9    No results for input(s): PROT, ALBUMIN, AST, ALT, ALKPHOS, BILITOT in the last 168 hours. Hematology Recent Labs  Lab 10/25/17 1347 10/26/17 0325  WBC 7.6 8.0  RBC 4.83 4.70  HGB 13.7 12.6  HCT 41.1 40.3  MCV 85.1 85.7  MCH 28.4 26.8  MCHC 33.3 31.3  RDW 16.3* 15.6*  PLT 270 241   Cardiac Enzymes Recent Labs  Lab 10/25/17 1347 10/25/17 2158 10/26/17 0325 10/26/17 0951  TROPONINI 0.03* <0.03 <0.03 <0.03   No results for input(s): TROPIPOC in the last 168 hours.  BNPNo results for input(s): BNP, PROBNP in the last 168 hours.  DDimer No results for input(s): DDIMER in the last 168 hours.  Radiology/Studies:  Dg Chest 2 View  Result Date: 10/25/2017 CLINICAL DATA:  Weakness since yesterday with tachycardia. EXAM: CHEST - 2 VIEW COMPARISON:  07/19/2016 FINDINGS: Lungs are adequately inflated and otherwise clear. Cardiomediastinal silhouette and remainder the exam is unchanged. IMPRESSION: No active cardiopulmonary disease. Electronically Signed   By: Marin Olp M.D.   On: 10/25/2017 14:49    Assessment and Plan:   1. Atrial fibrillation with RVR: She is admitted with new onset atrial fibrillation with rapid ventricular rates. She is currently on IV Cardizem with continued elevated heart rates up to 120 bpm. I would continue IV Cardizem today along with the oral beta blocker and hopefully she will convert to sinus. Continue IV heparin today. I have reviewed the etiology of atrial fibrillation and potential treatment options. Her CHADS VASC score is 3.  -She will need long term  anticoagulation with coumadin. Given her renal insufficiency, she is not a good candidate for Eliquis or Xarelto.  I will start the coumadin tonight.  -We will plan TEE guided DCCV if she has not converted to sinus rhythm as it does not appear that we will be able to rate control her on Cardizem.  Due to the schedule, her cardioversion will have to be on Thursday morning at 9am. She will need to be NPO on Wednesday night.    For questions or updates, please contact [place] Please consult [email or web address] for contact info under     Signed, Lauree Chandler, MD  10/26/2017 12:19 PM

## 2017-10-26 NOTE — Evaluation (Signed)
Occupational Therapy Evaluation Patient Details Name: Jillian Hayes MRN: [id number] DOB: 04/14/1945 Today's Date: 10/26/2017    History of Present Illness Pt admitted with A-fib 9/23. PMHx: HTN, CKD-IV, heart valve disease, hyperthyroid, anemia, and depression.    Clinical Impression   Patient evaluated by Occupational Therapy with no further acute OT needs identified. All education has been completed and the patient has no further questions. See below for any follow-up Occupational Therapy or equipment needs. OT to sign off. Thank you for referral.   HR remained 72-76 entire session.     Follow Up Recommendations  No OT follow up    Equipment Recommendations  None recommended by OT    Recommendations for Other Services       Precautions / Restrictions Precautions Precautions: Fall Restrictions Weight Bearing Restrictions: No      Mobility Bed Mobility Overal bed mobility: Independent                Transfers Overall transfer level: Needs assistance     Sit to Stand: Supervision         General transfer comment: pt self guarding with transfers with decr speed. pt reports not feeling back to [place]kline    Balance Overall balance assessment: Mild deficits observed, not formally tested   Sitting balance-Leahy Scale: Normal       Standing balance-Leahy Scale: Good               High level balance activites: Turns;Sudden stops;Head turns;Side stepping High Level Balance Comments: pt with small loss of balance when stepping around obstacle, min guard for challenge.            ADL either performed or assessed with clinical judgement   ADL Overall ADL's : Independent                                             Vision Patient Visual Report: No change from baseline       Perception     Praxis      Pertinent Vitals/Pain Pain Assessment: No/denies pain     Hand Dominance Right   Extremity/Trunk Assessment Upper  Extremity Assessment Upper Extremity Assessment: Overall WFL for tasks assessed   Lower Extremity Assessment Lower Extremity Assessment: Generalized weakness   Cervical / Trunk Assessment Cervical / Trunk Assessment: Normal   Communication Communication Communication: No difficulties   Cognition Arousal/Alertness: Awake/alert Behavior During Therapy: WFL for tasks assessed/performed Overall Cognitive Status: Within Functional Limits for tasks assessed                                     General Comments       Exercises     Shoulder Instructions      Home Living Family/patient expects to be discharged to:: Private residence Living Arrangements: Spouse/significant other;Children Available Help at Discharge: Family Type of Home: House Home Access: Level entry     Home Layout: One level     Bathroom Shower/Tub: Teacher, early years/pre: Handicapped height     Home Equipment: Cane - single point          Prior Functioning/Environment Level of Independence: Independent        Comments: pt independent with all ADLs, driving, likes to go shopping. Pt has had  one fall in last six months outside doing lawncare.         OT Problem List:        OT Treatment/Interventions:      OT Goals(Current goals can be found in the care plan section) Acute Rehab OT Goals Patient Stated Goal: return home  Potential to Achieve Goals: Good  OT Frequency:     Barriers to D/C:            Co-evaluation              AM-PAC PT "6 Clicks" Daily Activity     Outcome Measure Help from another person eating meals?: None Help from another person taking care of personal grooming?: None Help from another person toileting, which includes using toliet, bedpan, or urinal?: None Help from another person bathing (including washing, rinsing, drying)?: None Help from another person to put on and taking off regular upper body clothing?: None Help from  another person to put on and taking off regular lower body clothing?: None 6 Click Score: 24   End of Session Nurse Communication: Mobility status;Precautions  Activity Tolerance: Patient tolerated treatment well Patient left: in bed;with call bell/phone within reach                   Time: 1447-1457 OT Time Calculation (min): 10 min Charges:  OT General Charges $OT Visit: 1 Visit OT Evaluation $OT Eval Low Complexity: 1 Low   Jeri Modena, OTR/L  Acute Rehabilitation Services Pager: [phone] Office: [phone] .   Parke Poisson B 10/26/2017, 4:35 PM

## 2017-10-26 NOTE — Progress Notes (Signed)
[place] for heparin Indication: atrial fibrillation  Allergies  Allergen Reactions  . Sulfa Antibiotics Nausea And Vomiting    Patient Measurements: Height: 5\' 7"  (170.2 cm) Weight: 138 lb 1.6 oz (62.6 kg) IBW/kg (Calculated) : 61.6 Heparin Dosing Weight: 65kg  Vital Signs: Temp: 97.9 F (36.6 C) (09/24 0800) Temp Source: Oral (09/24 0800) BP: 141/73 (09/24 0800) Pulse Rate: 97 (09/24 0458)  Labs: Recent Labs    10/25/17 1347 10/25/17 2158 10/26/17 0325 10/26/17 0704  HGB 13.7  --  12.6  --   HCT 41.1  --  40.3  --   PLT 270  --  241  --   APTT  --  20*  --   --   LABPROT  --  12.7 14.1  --   INR  --  0.96 1.09  --   HEPARINUNFRC  --   --   --  0.55  CREATININE 2.42*  --  2.21*  --   TROPONINI 0.03* <0.03 <0.03  --     Estimated Creatinine Clearance: 22.7 mL/min (A) (by C-G formula based on SCr of 2.21 mg/dL (H)).   Medical History: Past Medical History:  Diagnosis Date  . Advanced care planning/counseling discussion 06/10/2014   05/31/2014 patient presents copy of HCP and Living Will  . Anemia    iron deficiency  . Anxiety   . Benign fundic gland polyps of stomach   . Bladder polyps 06/25/2010  . Chronic headaches 06/24/2010  . Chronic renal insufficiency, stage 4 (severe) ([place]) 11/09/2015  . Depression 1991   hospitalized  . History of chicken pox 06/25/2010  . Hypercalcemia 02/18/2014  . Hypertension   . Insomnia 06/24/2010  . Multiple chemical sensitivity syndrome 06/25/2010  . Proteinuria 02/18/2014  . Renal insufficiency 03/26/2011  . Valvular heart disease 04/28/2016   Assessment: 72 year old female admitted for afib/RVR (CHADSVASC=3)  on IV heparin.  -initial level at goal, CBC stable  Goal of Therapy:  Heparin level 0.3-0.7 units/ml Monitor platelets by anticoagulation protocol: Yes   Plan:  -no heparin changes needed -Will recheck a heparin level in 8 hrs -daily heparin level and CBC  Hildred Laser,  PharmD Clinical Pharmacist Please check Amion for pharmacy contact number

## 2017-10-27 DIAGNOSIS — N184 Chronic kidney disease, stage 4 (severe): Secondary | ICD-10-CM

## 2017-10-27 DIAGNOSIS — I10 Essential (primary) hypertension: Secondary | ICD-10-CM

## 2017-10-27 DIAGNOSIS — I480 Paroxysmal atrial fibrillation: Secondary | ICD-10-CM

## 2017-10-27 LAB — COMPREHENSIVE METABOLIC PANEL
ALT: 17 U/L (ref 0–44)
ANION GAP: 7 (ref 5–15)
AST: 24 U/L (ref 15–41)
Albumin: 2.8 g/dL — ABNORMAL LOW (ref 3.5–5.0)
Alkaline Phosphatase: 43 U/L (ref 38–126)
BUN: 45 mg/dL — ABNORMAL HIGH (ref 8–23)
CHLORIDE: 111 mmol/L (ref 98–111)
CO2: 22 mmol/L (ref 22–32)
CREATININE: 2.21 mg/dL — AB (ref 0.44–1.00)
Calcium: 9.5 mg/dL (ref 8.9–10.3)
GFR calc non Af Amer: 21 mL/min — ABNORMAL LOW (ref >60)
GFR, EST AFRICAN AMERICAN: 25 mL/min — AB (ref >60)
Glucose, Bld: 89 mg/dL (ref 70–99)
POTASSIUM: 3.5 mmol/L (ref 3.5–5.1)
SODIUM: 140 mmol/L (ref 135–145)
Total Bilirubin: 0.7 mg/dL (ref 0.3–1.2)
Total Protein: 5.1 g/dL — ABNORMAL LOW (ref 6.5–8.1)

## 2017-10-27 LAB — CBC
HCT: 35.7 % — ABNORMAL LOW (ref 36.0–46.0)
HEMOGLOBIN: 11.1 g/dL — AB (ref 12.0–15.0)
MCH: 27 pg (ref 26.0–34.0)
MCHC: 31.1 g/dL (ref 30.0–36.0)
MCV: 86.9 fL (ref 78.0–100.0)
PLATELETS: 215 10*3/uL (ref 150–400)
RBC: 4.11 MIL/uL (ref 3.87–5.11)
RDW: 15.5 % (ref 11.5–15.5)
WBC: 5.9 10*3/uL (ref 4.0–10.5)

## 2017-10-27 LAB — PROTIME-INR
INR: 1.08
Prothrombin Time: 13.9 seconds (ref 11.4–15.2)

## 2017-10-27 LAB — MAGNESIUM: MAGNESIUM: 2 mg/dL (ref 1.7–2.4)

## 2017-10-27 LAB — HEPARIN LEVEL (UNFRACTIONATED): HEPARIN UNFRACTIONATED: 0.68 [IU]/mL (ref 0.30–0.70)

## 2017-10-27 MED ORDER — DILTIAZEM HCL ER COATED BEADS 120 MG PO CP24: 120.0000 mg | ORAL_CAPSULE | Freq: Every day | ORAL | 0 refills | Status: DC

## 2017-10-27 MED ORDER — WARFARIN SODIUM 5 MG PO TABS: 5.0000 mg | ORAL_TABLET | Freq: Once | ORAL | 0 refills | Status: DC

## 2017-10-27 MED ORDER — WARFARIN SODIUM 5 MG PO TABS: 5.0000 mg | ORAL_TABLET | Freq: Once | ORAL | Status: DC

## 2017-10-27 NOTE — Progress Notes (Signed)
Per Dr. Rosezella [place] request:  Coumadin clinic scheduled Monday 9/30 @ 10:30am. There were no appts available on Friday and I did confirm OK to do Monday with Dr. Percival Spanish, would prefer no later given new start  F/u appt also scheduled with PA 10/25. Appt info added to AVS.  I notified endo to cancel TEE DCCV for tomorrow.  Janaya Broy PA-C

## 2017-10-27 NOTE — Discharge Summary (Addendum)
Physician Discharge Summary  Jillian Hayes  JJH:[id number]  DOB: 09/08/1945  DOA: 10/25/2017 PCP: [name], MD  Admit date: 10/25/2017 Discharge date: 10/27/2017  Admitted From: Home  Disposition: Home   Recommendations for Outpatient Follow-up:  1. Follow up with PCP in 1 week  2. Please obtain BMP/CBC in one week to monitor renal function and Hgb  3. Follow-up with cardiology on October 25 4. Coumadin checkup on Monday 9/30  Discharge Condition: Stable CODE STATUS: Full code Diet recommendation: Heart Healthy   Brief/Interim Summary: For full details see H&P/Progress note, but in brief, Jillian Hayes is a 72 year old female with history of CKD stage IV, hypertension, hyperparathyroidism and anemia presented to the emergency department complaining of few days of lethargy and lightheadedness.  Upon ED evaluation she was found to be A. fib with RVR.  Patient was placed on Cardizem drip and started on anticoagulation.  Patient spoke tenuously converted to normal sinus rhythm, transition to oral Cardizem and started on warfarin.  Cardiology was consulted.  Recommended outpatient follow-up and no further work-up.  Patient has clinically improved from feeling back to baseline she was deemed stable to follow-up as an outpatient.  Subjective: She is seen and examined, has no.  Denies chest pain, shortness of breath, palpitations and dizziness.  No acute events overnight.  Remains in sinus rhythm.  Discharge Diagnoses/Hospital Course:  A. fib with RVR Treated with Cardizem drip, subsequently switched to oral formulation.  Spontaneously converted to NSR, being treated with IV heparin and warfarin.  Case discussed with cardiology they recommend that there is no need for heparin/warfarin bridge and she can go home on Coumadin and monitor levels as an outpatient. Not candidate for NOAC due to CKD Patient is asymptomatic, she will continue oral Cardizem 120 mg daily, Coreg and Coumadin.   Cardiology outpatient has been arranged as well INR monitoring.  CKD stage IV  Creatinine stable, avoid nephrotoxic agent and follow-up BMP in 1 week  Hypertension BP stable during hospital stay Continue Coreg, Cardizem and resume hydralazine.  Norvasc has been stopped.  Hyperlipidemia Continue statin  All other chronic medical condition were stable during the hospitalization.  On the day of the discharge the patient's vitals were stable, and no other acute medical condition were reported by patient. the patient was felt safe to be discharge to home  Discharge Instructions  You were cared for by a hospitalist during your hospital stay. If you have any questions about your discharge medications or the care you received while you were in the hospital after you are discharged, you can call the unit and asked to speak with the hospitalist on call if the hospitalist that took care of you is not available. Once you are discharged, your primary care physician will handle any further medical issues. Please note that NO REFILLS for any discharge medications will be authorized once you are discharged, as it is imperative that you return to your primary care physician (or establish a relationship with a primary care physician if you do not have one) for your aftercare needs so that they can reassess your need for medications and monitor your lab values.  Discharge Instructions    Call MD for:  difficulty breathing, headache or visual disturbances   Complete by:  As directed    Call MD for:  extreme fatigue   Complete by:  As directed    Call MD for:  hives   Complete by:  As directed  Call MD for:  persistant dizziness or light-headedness   Complete by:  As directed    Call MD for:  persistant nausea and vomiting   Complete by:  As directed    Call MD for:  redness, tenderness, or signs of infection (pain, swelling, redness, odor or green/yellow discharge around incision site)   Complete by:  As  directed    Call MD for:  severe uncontrolled pain   Complete by:  As directed    Call MD for:  temperature >100.4   Complete by:  As directed    Diet - low sodium heart healthy   Complete by:  As directed    Increase activity slowly   Complete by:  As directed      Allergies as of 10/27/2017      Reactions   Sulfa Antibiotics Nausea And Vomiting      Medication List    STOP taking these medications   amLODipine 5 MG tablet Commonly known as:  NORVASC   aspirin EC 81 MG tablet   Ferrous Fumarate 324 (106 Fe) MG Tabs tablet Commonly known as:  HEMOCYTE - 106 mg FE     TAKE these medications   acetaminophen 500 MG tablet Commonly known as:  TYLENOL Take 500 mg by mouth every 6 (six) hours as needed for headache (pain).   Alfalfa 250 MG Tabs Take 1,500 mg by mouth 3 (three) times daily.   b complex vitamins capsule Take 1 capsule by mouth 2 (two) times daily with a meal.   carvedilol 3.125 MG tablet Commonly known as:  COREG TAKE 1 TABLET BY MOUTH TWICE DAILY   diltiazem 120 MG 24 hr capsule Commonly known as:  CARDIZEM CD Take 1 capsule (120 mg total) by mouth daily. Start taking on:  10/28/2017   FLAX SEED OIL PO Take 1 capsule 2 (two) times daily by mouth.   hydrALAZINE 10 MG tablet Commonly known as:  APRESOLINE TAKE 1 TABLET BY MOUTH THREE TIMES DAILY What changed:    when to take this  Another medication with the same name was removed. Continue taking this medication, and follow the directions you see here.   IRON PO Take 15 mg by mouth daily. [place] product   L-GLUTAMINE PO Take 1 capsule by mouth daily after supper.   PROBIOTIC DAILY PO Take 1 tablet by mouth daily with lunch.   vitamin C 500 MG tablet Commonly known as:  ASCORBIC ACID Take 500 mg by mouth daily as needed (immune system boost).   VITAMIN E PO Take 1 capsule by mouth daily with lunch.   warfarin 5 MG tablet Commonly known as:  COUMADIN Take 1 tablet (5 mg total) by  mouth one time only at 6 PM.      Follow-up Information    [facility] Follow up.   Specialty:  Cardiology Why:  [place] location - Monday 11/01/17 at 10:30am for Coumadin clinic visit Contact information: [address] [place] [place], Evelene Croon, PA-C Follow up.   Specialties:  Cardiology, Radiology Why:  [facility] location - Friday Nov 26, 2017 10:30 AM. Eartha Inch 15 minutes early to check in. Suanne Marker is one of the PAs that works with the cardiology team. Contact information: [address] [place] [address] [phone]        [name], MD. Schedule an appointment as soon as  possible for a visit in 1 week(s).   Specialty:  Family Medicine Why:  Hospital follow-up Contact information: [address] [place] [address] [phone]          Allergies  Allergen Reactions  . Sulfa Antibiotics Nausea And Vomiting    Consultations:  Cardiology    Procedures/Studies: Dg Chest 2 View  Result Date: 10/25/2017 CLINICAL DATA:  Weakness since yesterday with tachycardia. EXAM: CHEST - 2 VIEW COMPARISON:  07/19/2016 FINDINGS: Lungs are adequately inflated and otherwise clear. Cardiomediastinal silhouette and remainder the exam is unchanged. IMPRESSION: No active cardiopulmonary disease. Electronically Signed   By: Marin Olp M.D.   On: 10/25/2017 14:49   ECHO 9/24 ------------------------------------------------------------------- Study Conclusions  - Left ventricle: The cavity size was normal. Systolic function was   normal. The estimated ejection fraction was in the range of 60%   to 65%. Wall motion was normal; there were no regional wall   motion abnormalities. The study was not technically sufficient to   allow evaluation of LV diastolic dysfunction due to atrial   fibrillation. - Aortic valve: Trileaflet; mildly  thickened, mildly calcified   leaflets. There was moderate regurgitation. Valve area (VTI):   2.91 cm^2. Valve area (Vmax): 2.34 cm^2. Valve area (Vmean): 2.67   cm^2. - Left atrium: The atrium was mildly dilated. - Right atrium: The atrium was moderately dilated. - Tricuspid valve: There was mild-moderate regurgitation. - Pulmonic valve: There was trivial regurgitation. - Pulmonary arteries: Systolic pressure could not be accurately   estimated. - Inferior vena cava: The vessel was mildly dilated. The   respirophasic diameter changes were in the normal range (>= 50%). - Pericardium, extracardiac: A trivial pericardial effusion was   identified posterior to the heart.  Discharge Exam: Vitals:   10/27/17 0800 10/27/17 1205  BP: (!) 155/66 (!) 157/62  Pulse: 61 60  Resp:    Temp: (!) 97.4 F (36.3 C) 97.7 F (36.5 C)  SpO2: 97% 97%   Vitals:   10/26/17 2337 10/27/17 0653 10/27/17 0800 10/27/17 1205  BP: (!) 144/68 (!) 150/70 (!) 155/66 (!) 157/62  Pulse: 69 (!) 57 61 60  Resp:  19    Temp: 98.6 F (37 C) 97.8 F (36.6 C) (!) 97.4 F (36.3 C) 97.7 F (36.5 C)  TempSrc: Oral Oral Oral Oral  SpO2: 94% 96% 97% 97%  Weight:  64.6 kg    Height:        General: Pt is alert, awake, not in acute distress Cardiovascular: RRR, S1/S2 +, no rubs, no gallops Respiratory: CTA bilaterally, no wheezing, no rhonchi Abdominal: Soft Extremities: no edema   The results of significant diagnostics from this hospitalization (including imaging, microbiology, ancillary and laboratory) are listed below for reference.     Microbiology: No results found for this or any previous visit (from the past 240 hour(s)).   Labs: BNP (last 3 results) No results for input(s): BNP in the last 8760 hours. Basic Metabolic Panel: Recent Labs  Lab 10/25/17 1347 10/26/17 0325 10/27/17 0432  NA 139 141 140  K 4.0 3.7 3.5  CL 103 110 111  CO2 23 22 22   GLUCOSE 154* 94 89  BUN 57* 50* 45*   CREATININE 2.42* 2.21* 2.21*  CALCIUM 10.7* 10.3 9.5  MG  --   --  2.0   Liver Function Tests: Recent Labs  Lab 10/27/17 0432  AST 24  ALT 17  ALKPHOS 43  BILITOT 0.7  PROT 5.1*  ALBUMIN 2.8*   No results for input(s): LIPASE, AMYLASE in the last 168 hours. No results for input(s): AMMONIA in the last 168 hours. CBC: Recent Labs  Lab 10/25/17 1347 10/26/17 0325 10/27/17 0432  WBC 7.6 8.0 5.9  HGB 13.7 12.6 11.1*  HCT 41.1 40.3 35.7*  MCV 85.1 85.7 86.9  PLT 270 241 215   Cardiac Enzymes: Recent Labs  Lab 10/25/17 1347 10/25/17 2158 10/26/17 0325 10/26/17 0951  TROPONINI 0.03* <0.03 <0.03 <0.03   BNP: Invalid input(s): POCBNP CBG: No results for input(s): GLUCAP in the last 168 hours. D-Dimer No results for input(s): DDIMER in the last 72 hours. Hgb A1c Recent Labs    10/25/17 2158  HGBA1C 5.7*   Lipid Profile Recent Labs    10/26/17 0325  CHOL 192  HDL 79  LDLCALC 104*  TRIG 43  CHOLHDL 2.4   Thyroid function studies Recent Labs    10/25/17 2158  TSH 1.922   Anemia work up No results for input(s): VITAMINB12, FOLATE, FERRITIN, TIBC, IRON, RETICCTPCT in the last 72 hours. Urinalysis    Component Value Date/Time   COLORURINE YELLOW 07/19/2016 1152   APPEARANCEUR CLEAR 07/19/2016 1152   LABSPEC 1.009 07/19/2016 1152   PHURINE 5.0 07/19/2016 1152   GLUCOSEU NEGATIVE 07/19/2016 1152   GLUCOSEU NEGATIVE 02/15/2014 1501   HGBUR NEGATIVE 07/19/2016 1152   BILIRUBINUR NEGATIVE 07/19/2016 1152   BILIRUBINUR neg 10/05/2014 1153   KETONESUR NEGATIVE 07/19/2016 1152   PROTEINUR 100 (A) 07/19/2016 1152   UROBILINOGEN 0.2 10/05/2014 1153   UROBILINOGEN 0.2 02/15/2014 1501   NITRITE NEGATIVE 07/19/2016 1152   LEUKOCYTESUR NEGATIVE 07/19/2016 1152   Sepsis Labs Invalid input(s): PROCALCITONIN,  WBC,  LACTICIDVEN Microbiology No results found for this or any previous visit (from the past 240 hour(s)).  Time coordinating discharge: 25  minutes  SIGNED:  Chipper Oman, MD  [facility] 10/27/2017, 2:39 PM  Pager please text page via  [email or web address]  Note - This record has been created using [employer]. Chart creation errors have been sought, but may not always have been located. Such creation errors do not reflect on the standard of medical care.

## 2017-10-27 NOTE — Progress Notes (Signed)
Progress Note  Patient Name: Jillian Hayes Date of Encounter: 10/27/2017  Primary Cardiologist:   No primary care provider on file.   Subjective   No chest pain.  No SOB.   Inpatient Medications    Scheduled Meds: . carvedilol  3.125 mg Oral BID  . diltiazem  120 mg Oral Daily  . Influenza vac split quadrivalent PF  0.5 mL Intramuscular Tomorrow-1000  . pneumococcal 23 valent vaccine  0.5 mL Intramuscular Tomorrow-1000  . Warfarin - Pharmacist Dosing Inpatient   Does not apply q1800   Continuous Infusions: . sodium chloride 30 mL/hr at 10/27/17 0032  . sodium chloride 20 mL/hr at 10/27/17 0033  . heparin 850 Units/hr (10/27/17 0221)   PRN Meds: acetaminophen, ondansetron (ZOFRAN) IV   Vital Signs    Vitals:   10/26/17 1952 10/26/17 2337 10/27/17 0653 10/27/17 0800  BP: 139/62 (!) 144/68 (!) 150/70 (!) 155/66  Pulse: 62 69 (!) 57 61  Resp: 18  19   Temp: (!) 97.5 F (36.4 C) 98.6 F (37 C) 97.8 F (36.6 C) (!) 97.4 F (36.3 C)  TempSrc: Oral Oral Oral Oral  SpO2: 93% 94% 96% 97%  Weight:   64.6 kg   Height:        Intake/Output Summary (Last 24 hours) at 10/27/2017 0952 Last data filed at 10/27/2017 0600 Gross per 24 hour  Intake 2152 ml  Output 300 ml  Net 1852 ml   Filed Weights   10/25/17 2052 10/26/17 0457 10/27/17 0653  Weight: 62.4 kg 62.6 kg 64.6 kg    Telemetry    NSR - Personally Reviewed  ECG    NA - Personally Reviewed  Physical Exam   GEN: No acute distress.   Neck: No  JVD Cardiac: RRR, no murmurs, rubs, or gallops.  Respiratory: Clear  to auscultation bilaterally. GI: Soft, nontender, non-distended  MS: No  edema; No deformity. Neuro:  Nonfocal  Psych: Normal affect   Labs    Chemistry Recent Labs  Lab 10/25/17 1347 10/26/17 0325 10/27/17 0432  NA 139 141 140  K 4.0 3.7 3.5  CL 103 110 111  CO2 23 22 22   GLUCOSE 154* 94 89  BUN 57* 50* 45*  CREATININE 2.42* 2.21* 2.21*  CALCIUM 10.7* 10.3 9.5  PROT  --   --   5.1*  ALBUMIN  --   --  2.8*  AST  --   --  24  ALT  --   --  17  ALKPHOS  --   --  43  BILITOT  --   --  0.7  GFRNONAA 19* 21* 21*  GFRAA 22* 25* 25*  ANIONGAP 13 9 7      Hematology Recent Labs  Lab 10/25/17 1347 10/26/17 0325 10/27/17 0432  WBC 7.6 8.0 5.9  RBC 4.83 4.70 4.11  HGB 13.7 12.6 11.1*  HCT 41.1 40.3 35.7*  MCV 85.1 85.7 86.9  MCH 28.4 26.8 27.0  MCHC 33.3 31.3 31.1  RDW 16.3* 15.6* 15.5  PLT 270 241 215    Cardiac Enzymes Recent Labs  Lab 10/25/17 1347 10/25/17 2158 10/26/17 0325 10/26/17 0951  TROPONINI 0.03* <0.03 <0.03 <0.03   No results for input(s): TROPIPOC in the last 168 hours.   BNPNo results for input(s): BNP, PROBNP in the last 168 hours.   DDimer No results for input(s): DDIMER in the last 168 hours.   Radiology    Dg Chest 2 View  Result Date: 10/25/2017 CLINICAL DATA:  Weakness since yesterday with tachycardia. EXAM: CHEST - 2 VIEW COMPARISON:  07/19/2016 FINDINGS: Lungs are adequately inflated and otherwise clear. Cardiomediastinal silhouette and remainder the exam is unchanged. IMPRESSION: No active cardiopulmonary disease. Electronically Signed   By: Marin Olp M.D.   On: 10/25/2017 14:49    Cardiac Studies   NA  Patient Profile     72 y.o. female with a hx of HTN, iron deficiency anemia, chronic kidney disease-stage 4, mild to moderate aortic insufficiency and depression who is being seen for the evaluation of atrial fibrillation with rapid ventricular response at the request of Dr. Thereasa Solo.  Assessment & Plan    ATRIAL FIB:  Converted on her own to NSR.  Now on PO Cardizem.  I would suggest that she not be be bridged with Lovenox.  She can go home with warfarin and we will arrange follow up.    HTN:  Home on low dose beta blocker and I would also suggest restarting the hydralazine.  The Norvasc has been stopped.      For questions or updates, please contact [place] Please consult [email or web address] for contact  info under Cardiology/STEMI.   Signed, Minus Breeding, MD  10/27/2017, 9:52 AM

## 2017-10-27 NOTE — Progress Notes (Signed)
[place] for heparin>>warfarin Indication: atrial fibrillation  Allergies  Allergen Reactions  . Sulfa Antibiotics Nausea And Vomiting    Patient Measurements: Height: 5\' 7"  (170.2 cm) Weight: 142 lb 8 oz (64.6 kg) IBW/kg (Calculated) : 61.6 Heparin Dosing Weight: 65kg  Vital Signs: Temp: 97.4 F (36.3 C) (09/25 0800) Temp Source: Oral (09/25 0800) BP: 155/66 (09/25 0800) Pulse Rate: 61 (09/25 0800)  Labs: Recent Labs    10/25/17 1347 10/25/17 2158 10/26/17 0325 10/26/17 0704 10/26/17 0951 10/26/17 1420 10/27/17 0432  HGB 13.7  --  12.6  --   --   --  11.1*  HCT 41.1  --  40.3  --   --   --  35.7*  PLT 270  --  241  --   --   --  215  APTT  --  20*  --   --   --   --   --   LABPROT  --  12.7 14.1  --   --   --  13.9  INR  --  0.96 1.09  --   --   --  1.08  HEPARINUNFRC  --   --   --  0.55  --  0.50 0.68  CREATININE 2.42*  --  2.21*  --   --   --  2.21*  TROPONINI 0.03* <0.03 <0.03  --  <0.03  --   --     Estimated Creatinine Clearance: 22.7 mL/min (A) (by C-G formula based on SCr of 2.21 mg/dL (H)).   Medical History: Past Medical History:  Diagnosis Date  . Advanced care planning/counseling discussion 06/10/2014   05/31/2014 patient presents copy of HCP and Living Will  . Anemia    iron deficiency  . Anxiety   . Benign fundic gland polyps of stomach   . Bladder polyps 06/25/2010  . Chronic headaches 06/24/2010  . Chronic renal insufficiency, stage 4 (severe) ([place]) 11/09/2015  . Depression 1991   hospitalized  . History of chicken pox 06/25/2010  . Hypercalcemia 02/18/2014  . Hypertension   . Insomnia 06/24/2010  . Multiple chemical sensitivity syndrome 06/25/2010  . Proteinuria 02/18/2014  . Renal insufficiency 03/26/2011  . Valvular heart disease 04/28/2016   Assessment: 72 year old female admitted for afib/RVR (CHADSVASC=3)  on IV heparin.   Patient continues on IV heparin for afib, now converted to NSR this am.  Heparin level is at goal this morning. Hgb down slightly. Cardiology is not recommending lovenox bridge and continuing with just warfarin.   INR is normal at 1.0 this morning after warfarin started last night.   Goal of Therapy:  Heparin level 0.3-0.7 units/ml Monitor platelets by anticoagulation protocol: Yes   Plan:  -no heparin changes needed -daily heparin level and CBC -repeat warfarin 5mg  tonight   [name] Hearing PharmD., BCPS Clinical Pharmacist 10/27/2017 10:33 AM Please check Amion for pharmacy contact number

## 2017-10-28 ENCOUNTER — Telehealth: Payer: Self-pay

## 2017-10-28 SURGERY — ECHOCARDIOGRAM, TRANSESOPHAGEAL
Anesthesia: Monitor Anesthesia Care

## 2017-10-28 NOTE — Consult Note (Signed)
            [facility] CM Primary Care Navigator  10/28/2017  Jillian Hayes August 21, 1945 [id number]   Went to seepatient at the bedside to identify possible discharge needs but shewasalreadydischargedhomeper staff report.  Per MD note,patientwas evaluated and managed after she presented with few days of lethargy and lightheadedness, was found to be in atrial fibrillation with rapid ventricular response. Cardiology was consulted and recommended outpatient follow-up and no further work-up.  Primary care provider's office is listed as providing transition of care (TOC) follow-up.   Patient has discharge instruction to follow-up withprimary care provider in 1 week and cardiology follow-up on 11/01/17.    For additional questions please contact:  Edwena Felty A. Gordon Vandunk, BSN, RN-BC [facility] PRIMARY CARE Navigator Cell: [phone]

## 2017-10-28 NOTE — Telephone Encounter (Signed)
Transition Care Management Follow-up Telephone Call  ADMISSION DATE: 10/25/17 DISCHARGE DATE: 10/27/17   How have you been since you were released from the hospital?  Feeling good per patient   Do you understand why you were in the hospital? Yes   Do you understand the discharge instrcutions? Yes    Items Reviewed:  Medications reviewed: Yes   Allergies reviewed: Yes   Dietary changes reviewed: Heart healthy   Referrals reviewed: Hospital Follow Up Appointment scheduled.   Functional Questionnaire:  Activities of Daily Living (ADLs): Patient can perform all independently. Any patient concerns? None at this time.   Confirmed importance and date/time of follow-up visits scheduled:Yes   Confirmed with patient if condition begins to worsen call PCP or go to the ER. Yes    Patient was given the office number and encouragred to call back with questions or concerns. Yes

## 2017-11-01 ENCOUNTER — Ambulatory Visit (INDEPENDENT_AMBULATORY_CARE_PROVIDER_SITE_OTHER): Payer: Medicare HMO | Admitting: Pharmacist Clinician (PhC)/ Clinical Pharmacy Specialist

## 2017-11-01 DIAGNOSIS — I4891 Unspecified atrial fibrillation: Secondary | ICD-10-CM

## 2017-11-01 DIAGNOSIS — I480 Paroxysmal atrial fibrillation: Secondary | ICD-10-CM | POA: Diagnosis not present

## 2017-11-01 DIAGNOSIS — Z7901 Long term (current) use of anticoagulants: Secondary | ICD-10-CM

## 2017-11-01 LAB — POCT INR: INR: 1.7 — AB (ref 2.0–3.0)

## 2017-11-01 NOTE — Patient Instructions (Signed)
Description   Take 1 tablet daily except for Mondays when you will take 1.5 tablets. Repeat INR in 1 week.

## 2017-11-05 ENCOUNTER — Encounter: Payer: Self-pay | Admitting: Family Medicine

## 2017-11-08 ENCOUNTER — Encounter: Payer: Self-pay | Admitting: Family Medicine

## 2017-11-08 ENCOUNTER — Ambulatory Visit (INDEPENDENT_AMBULATORY_CARE_PROVIDER_SITE_OTHER): Payer: Medicare HMO | Admitting: Pharmacist

## 2017-11-08 ENCOUNTER — Ambulatory Visit (INDEPENDENT_AMBULATORY_CARE_PROVIDER_SITE_OTHER): Payer: Medicare HMO | Admitting: Family Medicine

## 2017-11-08 VITALS — BP 140/64 | HR 71 | Temp 97.9°F | Resp 18 | Wt 141.8 lb

## 2017-11-08 DIAGNOSIS — N184 Chronic kidney disease, stage 4 (severe): Secondary | ICD-10-CM | POA: Diagnosis not present

## 2017-11-08 DIAGNOSIS — I480 Paroxysmal atrial fibrillation: Secondary | ICD-10-CM

## 2017-11-08 DIAGNOSIS — E663 Overweight: Secondary | ICD-10-CM

## 2017-11-08 DIAGNOSIS — I4891 Unspecified atrial fibrillation: Secondary | ICD-10-CM

## 2017-11-08 DIAGNOSIS — Z7901 Long term (current) use of anticoagulants: Secondary | ICD-10-CM | POA: Diagnosis not present

## 2017-11-08 DIAGNOSIS — D649 Anemia, unspecified: Secondary | ICD-10-CM | POA: Diagnosis not present

## 2017-11-08 DIAGNOSIS — T733XXS Exhaustion due to excessive exertion, sequela: Secondary | ICD-10-CM

## 2017-11-08 DIAGNOSIS — R002 Palpitations: Secondary | ICD-10-CM | POA: Diagnosis not present

## 2017-11-08 DIAGNOSIS — E782 Mixed hyperlipidemia: Secondary | ICD-10-CM

## 2017-11-08 DIAGNOSIS — I10 Essential (primary) hypertension: Secondary | ICD-10-CM | POA: Diagnosis not present

## 2017-11-08 LAB — COMPREHENSIVE METABOLIC PANEL
ALBUMIN: 3.8 g/dL (ref 3.5–5.2)
ALT: 19 U/L (ref 0–35)
AST: 27 U/L (ref 0–37)
Alkaline Phosphatase: 66 U/L (ref 39–117)
BILIRUBIN TOTAL: 0.4 mg/dL (ref 0.2–1.2)
BUN: 52 mg/dL — ABNORMAL HIGH (ref 6–23)
CALCIUM: 9.7 mg/dL (ref 8.4–10.5)
CO2: 27 mEq/L (ref 19–32)
CREATININE: 2.33 mg/dL — AB (ref 0.40–1.20)
Chloride: 103 mEq/L (ref 96–112)
GFR: 21.83 mL/min — AB (ref >60.00)
Glucose, Bld: 81 mg/dL (ref 70–99)
Potassium: 4.4 mEq/L (ref 3.5–5.1)
Sodium: 137 mEq/L (ref 135–145)
Total Protein: 6.1 g/dL (ref 6.0–8.3)

## 2017-11-08 LAB — CBC WITH DIFFERENTIAL/PLATELET
BASOS ABS: 0.1 10*3/uL (ref 0.0–0.1)
Basophils Relative: 1.1 % (ref 0.0–3.0)
EOS PCT: 3.6 % (ref 0.0–5.0)
Eosinophils Absolute: 0.2 10*3/uL (ref 0.0–0.7)
HEMATOCRIT: 36.8 % (ref 36.0–46.0)
HEMOGLOBIN: 12.1 g/dL (ref 12.0–15.0)
LYMPHS ABS: 1.5 10*3/uL (ref 0.7–4.0)
LYMPHS PCT: 28.3 % (ref 12.0–46.0)
MCHC: 32.8 g/dL (ref 30.0–36.0)
MCV: 83.1 fl (ref 78.0–100.0)
MONOS PCT: 10.4 % (ref 3.0–12.0)
Monocytes Absolute: 0.6 10*3/uL (ref 0.1–1.0)
Neutro Abs: 3 10*3/uL (ref 1.4–7.7)
Neutrophils Relative %: 56.6 % (ref 43.0–77.0)
Platelets: 238 10*3/uL (ref 150.0–400.0)
RBC: 4.43 Mil/uL (ref 3.87–5.11)
RDW: 16.2 % — AB (ref 11.5–15.5)
WBC: 5.3 10*3/uL (ref 4.0–10.5)

## 2017-11-08 LAB — POCT INR: INR: 2.4 (ref 2.0–3.0)

## 2017-11-08 MED ORDER — HYDRALAZINE HCL 10 MG PO TABS: 10.0000 mg | ORAL_TABLET | Freq: Three times a day (TID) | ORAL | 1 refills | Status: DC

## 2017-11-08 NOTE — Patient Instructions (Signed)
Atrial Fibrillation Atrial fibrillation is a type of irregular or rapid heartbeat (arrhythmia). In atrial fibrillation, the heart quivers continuously in a chaotic pattern. This occurs when parts of the heart receive disorganized signals that make the heart unable to pump blood normally. This can increase the risk for stroke, heart failure, and other heart-related conditions. There are different types of atrial fibrillation, including:  Paroxysmal atrial fibrillation. This type starts suddenly, and it usually stops on its own shortly after it starts.  Persistent atrial fibrillation. This type often lasts longer than a week. It may stop on its own or with treatment.  Long-lasting persistent atrial fibrillation. This type lasts longer than 12 months.  Permanent atrial fibrillation. This type does not go away.  Talk with your health care provider to learn about the type of atrial fibrillation that you have. What are the causes? This condition is caused by some heart-related conditions or procedures, including:  A heart attack.  Coronary artery disease.  Heart failure.  Heart valve conditions.  High blood pressure.  Inflammation of the sac that surrounds the heart (pericarditis).  Heart surgery.  Certain heart rhythm disorders, such as Wolf-Parkinson-White syndrome.  Other causes include:  Pneumonia.  Obstructive sleep apnea.  Blockage of an artery in the lungs (pulmonary embolism, or PE).  Lung cancer.  Chronic lung disease.  Thyroid problems, especially if the thyroid is overactive (hyperthyroidism).  Caffeine.  Excessive alcohol use or illegal drug use.  Use of some medicines, including certain decongestants and diet pills.  Sometimes, the cause cannot be found. What increases the risk? This condition is more likely to develop in:  People who are older in age.  People who smoke.  People who have diabetes mellitus.  People who are overweight  (obese).  Athletes who exercise vigorously.  What are the signs or symptoms? Symptoms of this condition include:  A feeling that your heart is beating rapidly or irregularly.  A feeling of discomfort or pain in your chest.  Shortness of breath.  Sudden light-headedness or weakness.  Getting tired easily during exercise.  In some cases, there are no symptoms. How is this diagnosed? Your health care provider may be able to detect atrial fibrillation when taking your pulse. If detected, this condition may be diagnosed with:  An electrocardiogram (ECG).  A Holter monitor test that records your heartbeat patterns over a 24-hour period.  Transthoracic echocardiogram (TTE) to evaluate how blood flows through your heart.  Transesophageal echocardiogram (TEE) to view more detailed images of your heart.  A stress test.  Imaging tests, such as a CT scan or chest X-ray.  Blood tests.  How is this treated? The main goals of treatment are to prevent blood clots from forming and to keep your heart beating at a normal rate and rhythm. The type of treatment that you receive depends on many factors, such as your underlying medical conditions and how you feel when you are experiencing atrial fibrillation. This condition may be treated with:  Medicine to slow down the heart rate, bring the heart's rhythm back to normal, or prevent clots from forming.  Electrical cardioversion. This is a procedure that resets your heart's rhythm by delivering a controlled, low-energy shock to the heart through your skin.  Different types of ablation, such as catheter ablation, catheter ablation with pacemaker, or surgical ablation. These procedures destroy the heart tissues that send abnormal signals. When the pacemaker is used, it is placed under your skin to help your heart beat in   a regular rhythm.  Follow these instructions at home:  Take over-the counter and prescription medicines only as told by your  health care provider.  If your health care provider prescribed a blood-thinning medicine (anticoagulant), take it exactly as told. Taking too much blood-thinning medicine can cause bleeding. If you do not take enough blood-thinning medicine, you will not have the protection that you need against stroke and other problems.  Do not use tobacco products, including cigarettes, chewing tobacco, and e-cigarettes. If you need help quitting, ask your health care provider.  If you have obstructive sleep apnea, manage your condition as told by your health care provider.  Do not drink alcohol.  Do not drink beverages that contain caffeine, such as coffee, soda, and tea.  Maintain a healthy weight. Do not use diet pills unless your health care provider approves. Diet pills may make heart problems worse.  Follow diet instructions as told by your health care provider.  Exercise regularly as told by your health care provider.  Keep all follow-up visits as told by your health care provider. This is important. How is this prevented?  Avoid drinking beverages that contain caffeine or alcohol.  Avoid certain medicines, especially medicines that are used for breathing problems.  Avoid certain herbs and herbal medicines, such as those that contain ephedra or ginseng.  Do not use illegal drugs, such as cocaine and amphetamines.  Do not smoke.  Manage your high blood pressure. Contact a health care provider if:  You notice a change in the rate, rhythm, or strength of your heartbeat.  You are taking an anticoagulant and you notice increased bruising.  You tire more easily when you exercise or exert yourself. Get help right away if:  You have chest pain, abdominal pain, sweating, or weakness.  You feel nauseous.  You notice blood in your vomit, bowel movement, or urine.  You have shortness of breath.  You suddenly have swollen feet and ankles.  You feel dizzy.  You have sudden weakness or  numbness of the face, arm, or leg, especially on one side of the body.  You have trouble speaking, trouble understanding, or both (aphasia).  Your face or your eyelid droops on one side. These symptoms may represent a serious problem that is an emergency. Do not wait to see if the symptoms will go away. Get medical help right away. Call your local emergency services (911 in the [place] Do not drive yourself to the hospital. This information is not intended to replace advice given to you by your health care provider. Make sure you discuss any questions you have with your health care provider. Document Released: 01/19/2005 Document Revised: 05/29/2015 Document Reviewed: 05/16/2014 Elsevier Interactive Patient Education  2018 [employer]  

## 2017-11-12 DIAGNOSIS — R809 Proteinuria, unspecified: Secondary | ICD-10-CM | POA: Diagnosis not present

## 2017-11-12 DIAGNOSIS — I129 Hypertensive chronic kidney disease with stage 1 through stage 4 chronic kidney disease, or unspecified chronic kidney disease: Secondary | ICD-10-CM | POA: Diagnosis not present

## 2017-11-12 DIAGNOSIS — N184 Chronic kidney disease, stage 4 (severe): Secondary | ICD-10-CM | POA: Diagnosis not present

## 2017-11-12 DIAGNOSIS — D631 Anemia in chronic kidney disease: Secondary | ICD-10-CM | POA: Diagnosis not present

## 2017-11-14 NOTE — Assessment & Plan Note (Signed)
Following with nephrology. stable

## 2017-11-14 NOTE — Assessment & Plan Note (Signed)
Rate controlled and tolerating Warfarin.

## 2017-11-14 NOTE — Assessment & Plan Note (Signed)
Well controlled, no changes to meds. Encouraged heart healthy diet such as the DASH diet and exercise as tolerated.  °

## 2017-11-14 NOTE — Progress Notes (Signed)
Subjective:    Patient ID: Jillian Hayes, female    DOB: 1945/07/27, 72 y.o.   MRN: [id number]  No chief complaint on file.   HPI Patient is in today for follow up and she is feeling well. She is following with coumadin clinic. No recent febrile illness or acute concerns. No polyuria or polydipsia. Her appetite is stable. No symcope or presyncope. Denies CP/palp/SOB/HA/congestion/fevers/GI or GU c/o. Taking meds as prescribed. She was recently hospitalized for a fib flare but feels well now. She has had weight loss but has been trying to minimize processed foods, breads, carbs, salt and sweets. Is eating moe fruits and veg with protein.   Past Medical History:  Diagnosis Date  . Advanced care planning/counseling discussion 06/10/2014   05/31/2014 patient presents copy of HCP and Living Will  . Anemia    iron deficiency  . Anxiety   . Benign fundic gland polyps of stomach   . Bladder polyps 06/25/2010  . Chronic headaches 06/24/2010  . Chronic renal insufficiency, stage 4 (severe) ([place]) 11/09/2015  . Depression 1991   hospitalized  . History of chicken pox 06/25/2010  . Hypercalcemia 02/18/2014  . Hypertension   . Insomnia 06/24/2010  . Multiple chemical sensitivity syndrome 06/25/2010  . Proteinuria 02/18/2014  . Renal insufficiency 03/26/2011  . Valvular heart disease 04/28/2016    Past Surgical History:  Procedure Laterality Date  . cyst on left breast removed Left 1991   benign  . NASAL SEPTUM SURGERY  1986   rhinoplasty  . polyps on bladder removed  1972   benign  . TONSILLECTOMY  1962    Family History  Problem Relation Age of Onset  . Breast cancer Mother 10       left breast removed, 2010 lung  . Diabetes Mother   . Nephrolithiasis Father   . Heart attack Father 49       X 3  . Heart disease Father        smoker  . Hypertension Brother   . Allergic rhinitis Brother   . Melanoma Son 37       melanoma on leg removed  . Pancreatic cancer Maternal Grandmother   .  Hypertension Paternal Grandmother   . Obesity Paternal Grandmother   . Heart attack Paternal Grandfather 11  . Diabetes Maternal Aunt        x 2  . Diabetes Maternal Uncle        x 3  . Asthma Neg Hx   . Eczema Neg Hx   . Urticaria Neg Hx   . Immunodeficiency Neg Hx   . Angioedema Neg Hx     Social History   Socioeconomic History  . Marital status: Married    Spouse name: Not on file  . Number of children: 2  . Years of education: Not on file  . Highest education level: Not on file  Occupational History  . Occupation: retired  Scientific laboratory technician  . Financial resource strain: Not on file  . Food insecurity:    Worry: Never true    Inability: Never true  . Transportation needs:    Medical: No    Non-medical: No  Tobacco Use  . Smoking status: Never Smoker  . Smokeless tobacco: Never Used  Substance and Sexual Activity  . Alcohol use: No  . Drug use: No  . Sexual activity: Yes    Partners: Male    Comment: lives with husband, wears with husband  Lifestyle  .  Physical activity:    Days per week: Not on file    Minutes per session: Not on file  . Stress: Not on file  Relationships  . Social connections:    Talks on phone: Not on file    Gets together: Not on file    Attends religious service: Not on file    Active member of club or organization: Not on file    Attends meetings of clubs or organizations: Not on file    Relationship status: Not on file  . Intimate partner violence:    Fear of current or ex partner: No    Emotionally abused: No    Physically abused: No    Forced sexual activity: No  Other Topics Concern  . Not on file  Social History Narrative   Lives with husband.      Outpatient Medications Prior to Visit  Medication Sig Dispense Refill  . acetaminophen (TYLENOL) 500 MG tablet Take 500 mg by mouth every 6 (six) hours as needed for headache (pain).    . Alfalfa 250 MG TABS Take 1,500 mg by mouth 3 (three) times daily.    Marland Kitchen b complex vitamins  capsule Take 1 capsule by mouth 2 (two) times daily with a meal.     . carvedilol (COREG) 3.125 MG tablet TAKE 1 TABLET BY MOUTH TWICE DAILY (Patient taking differently: Take 3.125 mg by mouth 2 (two) times daily. ) 180 tablet 1  . diltiazem (CARDIZEM CD) 120 MG 24 hr capsule Take 1 capsule (120 mg total) by mouth daily. 30 capsule 0  . Flaxseed, Linseed, (FLAX SEED OIL PO) Take 1 capsule 2 (two) times daily by mouth.     . IRON PO Take 15 mg by mouth daily. [place] product    . L-GLUTAMINE PO Take 1 capsule by mouth daily after supper.     . Probiotic Product (PROBIOTIC DAILY PO) Take 1 tablet by mouth daily with lunch.     . vitamin C (ASCORBIC ACID) 500 MG tablet Take 500 mg by mouth daily as needed (immune system boost).     Marland Kitchen VITAMIN E PO Take 1 capsule by mouth daily with lunch.    . warfarin (COUMADIN) 5 MG tablet Take 1 tablet (5 mg total) by mouth one time only at 6 PM. 30 tablet 0  . hydrALAZINE (APRESOLINE) 10 MG tablet TAKE 1 TABLET BY MOUTH THREE TIMES DAILY (Patient taking differently: Take 10 mg by mouth 2 (two) times daily. ) 270 tablet 0   No facility-administered medications prior to visit.     Allergies  Allergen Reactions  . Sulfa Antibiotics Nausea And Vomiting    Review of Systems  Constitutional: Negative for fever and malaise/fatigue.  HENT: Negative for congestion.   Eyes: Negative for blurred vision.  Respiratory: Negative for cough and shortness of breath.   Cardiovascular: Negative for chest pain, palpitations and leg swelling.  Gastrointestinal: Negative for vomiting.  Musculoskeletal: Negative for back pain.  Skin: Negative for rash.  Neurological: Negative for loss of consciousness and headaches.       Objective:    Physical Exam  Constitutional: She is oriented to person, place, and time. She appears well-developed and well-nourished. No distress.  HENT:  Head: Normocephalic and atraumatic.  Nose: Nose normal.  Eyes: Right eye exhibits no  discharge. Left eye exhibits no discharge.  Neck: Normal range of motion. Neck supple.  Cardiovascular: Normal rate.  No murmur heard. Pulmonary/Chest: Effort normal and breath sounds normal.  Abdominal: Soft. Bowel sounds are normal. There is no tenderness.  Musculoskeletal: She exhibits no edema.  Neurological: She is alert and oriented to person, place, and time.  Skin: Skin is warm and dry.  Psychiatric: She has a normal mood and affect.  Nursing note and vitals reviewed.   BP 140/64   Pulse 71   Temp 97.9 F (36.6 C) (Oral)   Resp 18   Wt 141 lb 12.8 oz (64.3 kg)   SpO2 97%   BMI 22.21 kg/m  Wt Readings from Last 3 Encounters:  11/08/17 141 lb 12.8 oz (64.3 kg)  10/27/17 142 lb 8 oz (64.6 kg)  04/06/17 156 lb 9.6 oz (71 kg)     Lab Results  Component Value Date   WBC 5.3 11/08/2017   HGB 12.1 11/08/2017   HCT 36.8 11/08/2017   PLT 238.0 11/08/2017   GLUCOSE 81 11/08/2017   CHOL 192 10/26/2017   TRIG 43 10/26/2017   HDL 79 10/26/2017   LDLCALC 104 (H) 10/26/2017   ALT 19 11/08/2017   AST 27 11/08/2017   NA 137 11/08/2017   K 4.4 11/08/2017   CL 103 11/08/2017   CREATININE 2.33 (H) 11/08/2017   BUN 52 (H) 11/08/2017   CO2 27 11/08/2017   TSH 1.922 10/25/2017   INR 2.4 11/08/2017   HGBA1C 5.7 (H) 10/25/2017    Lab Results  Component Value Date   TSH 1.922 10/25/2017   Lab Results  Component Value Date   WBC 5.3 11/08/2017   HGB 12.1 11/08/2017   HCT 36.8 11/08/2017   MCV 83.1 11/08/2017   PLT 238.0 11/08/2017   Lab Results  Component Value Date   NA 137 11/08/2017   K 4.4 11/08/2017   CHLORIDE 109 03/25/2016   CO2 27 11/08/2017   GLUCOSE 81 11/08/2017   BUN 52 (H) 11/08/2017   CREATININE 2.33 (H) 11/08/2017   BILITOT 0.4 11/08/2017   ALKPHOS 66 11/08/2017   AST 27 11/08/2017   ALT 19 11/08/2017   PROT 6.1 11/08/2017   ALBUMIN 3.8 11/08/2017   CALCIUM 9.7 11/08/2017   ANIONGAP 7 10/27/2017   EGFR 20 (L) 03/25/2016   GFR 21.83 (L)  11/08/2017   Lab Results  Component Value Date   CHOL 192 10/26/2017   Lab Results  Component Value Date   HDL 79 10/26/2017   Lab Results  Component Value Date   LDLCALC 104 (H) 10/26/2017   Lab Results  Component Value Date   TRIG 43 10/26/2017   Lab Results  Component Value Date   CHOLHDL 2.4 10/26/2017   Lab Results  Component Value Date   HGBA1C 5.7 (H) 10/25/2017       Assessment & Plan:   Problem List Items Addressed This Visit    Anemia - Primary   Relevant Orders   CBC with Differential/Platelet (Completed)   Comprehensive metabolic panel (Completed)   Overweight    Has had significant recent weight loss but has changed her eating habits significantly will have her back in 6 weeks to monitor      Fatigue   Relevant Orders   Comprehensive metabolic panel (Completed)   HTN (hypertension)    Well controlled, no changes to meds. Encouraged heart healthy diet such as the DASH diet and exercise as tolerated.       Chronic renal insufficiency, stage 4 (severe) (HCC)    Following with nephrology. stable      Palpitation    Doing well.  Hyperlipidemia    Encouraged heart healthy diet, increase exercise, avoid trans fats, consider a krill oil cap daily      Atrial fibrillation ([facility])    Rate controlled and tolerating Warfarin.          I am having [name] maintain her vitamin C, (Flaxseed, Linseed, (FLAX SEED OIL PO)), b complex vitamins, L-GLUTAMINE PO, Probiotic Product (PROBIOTIC DAILY PO), carvedilol, Alfalfa, IRON PO, VITAMIN E PO, acetaminophen, diltiazem, and warfarin.  No orders of the defined types were placed in this encounter.    [name], MD

## 2017-11-14 NOTE — Assessment & Plan Note (Signed)
Has had significant recent weight loss but has changed her eating habits significantly will have her back in 6 weeks to monitor

## 2017-11-14 NOTE — Assessment & Plan Note (Signed)
Doing well 

## 2017-11-14 NOTE — Assessment & Plan Note (Signed)
Encouraged heart healthy diet, increase exercise, avoid trans fats, consider a krill oil cap daily 

## 2017-11-15 ENCOUNTER — Ambulatory Visit (INDEPENDENT_AMBULATORY_CARE_PROVIDER_SITE_OTHER): Payer: Medicare HMO | Admitting: Pharmacist

## 2017-11-15 DIAGNOSIS — I4891 Unspecified atrial fibrillation: Secondary | ICD-10-CM

## 2017-11-15 DIAGNOSIS — Z7901 Long term (current) use of anticoagulants: Secondary | ICD-10-CM

## 2017-11-15 LAB — POCT INR: INR: 2.5 (ref 2.0–3.0)

## 2017-11-15 MED ORDER — DILTIAZEM HCL ER COATED BEADS 120 MG PO CP24: 120.0000 mg | ORAL_CAPSULE | Freq: Every day | ORAL | 0 refills | Status: DC

## 2017-11-15 MED ORDER — WARFARIN SODIUM 5 MG PO TABS: ORAL_TABLET | ORAL | 0 refills | Status: DC

## 2017-11-16 DIAGNOSIS — N184 Chronic kidney disease, stage 4 (severe): Secondary | ICD-10-CM | POA: Diagnosis not present

## 2017-11-16 DIAGNOSIS — E213 Hyperparathyroidism, unspecified: Secondary | ICD-10-CM | POA: Diagnosis not present

## 2017-11-16 DIAGNOSIS — I129 Hypertensive chronic kidney disease with stage 1 through stage 4 chronic kidney disease, or unspecified chronic kidney disease: Secondary | ICD-10-CM | POA: Diagnosis not present

## 2017-11-26 ENCOUNTER — Ambulatory Visit: Payer: Medicare HMO | Admitting: Physician Assistant

## 2017-11-30 ENCOUNTER — Ambulatory Visit (INDEPENDENT_AMBULATORY_CARE_PROVIDER_SITE_OTHER): Payer: Medicare HMO | Admitting: Pharmacist Clinician (PhC)/ Clinical Pharmacy Specialist

## 2017-11-30 ENCOUNTER — Ambulatory Visit: Payer: Medicare HMO | Admitting: Physician Assistant

## 2017-11-30 ENCOUNTER — Encounter: Payer: Self-pay | Admitting: Physician Assistant

## 2017-11-30 VITALS — BP 172/72 | HR 70 | Ht 66.5 in | Wt 140.0 lb

## 2017-11-30 DIAGNOSIS — Z7901 Long term (current) use of anticoagulants: Secondary | ICD-10-CM | POA: Diagnosis not present

## 2017-11-30 DIAGNOSIS — I480 Paroxysmal atrial fibrillation: Secondary | ICD-10-CM | POA: Diagnosis not present

## 2017-11-30 DIAGNOSIS — I10 Essential (primary) hypertension: Secondary | ICD-10-CM

## 2017-11-30 DIAGNOSIS — I4891 Unspecified atrial fibrillation: Secondary | ICD-10-CM | POA: Diagnosis not present

## 2017-11-30 LAB — POCT INR: INR: 3.3 — AB (ref 2.0–3.0)

## 2017-11-30 MED ORDER — CARVEDILOL 6.25 MG PO TABS: 6.2500 mg | ORAL_TABLET | Freq: Two times a day (BID) | ORAL | 3 refills | Status: DC

## 2017-11-30 NOTE — Patient Instructions (Signed)
Medication Instructions:  INCREASE YOUR CARVEDILOL TO 6.25 MG TWICE A DAY   If you need a refill on your cardiac medications before your next appointment, please call your pharmacy.   Lab work: NONE  Testing/Procedures: NONE  Follow-Up: At [facility], you and your health needs are our priority.  As part of our continuing mission to provide you with exceptional heart care, we have created designated Provider Care Teams.  These Care Teams include your primary Cardiologist (physician) and Advanced Practice Providers (APPs -  Physician Assistants and Nurse Practitioners) who all work together to provide you with the care you need, when you need it. You will need a follow up appointment in 3 months.  Please call our office 2 months in advance to schedule this appointment.  You may see Minus Breeding, MD or one of the following Advanced Practice Providers on your designated Care Team:   Rosaria Ferries, PA-C . Jory Sims, DNP, ANP  Any Other Special Instructions Will Be Listed Below (If Applicable).  LIMIT YOUR SUGAR AND CARBOHYDRATE INTAKE  MONITOR AND LOG YOUR BLOOD PRESSURES, SEND TO OFFICE IN [place]

## 2017-11-30 NOTE — Progress Notes (Signed)
Cardiology Office Note   Date:  11/30/2017   ID:  Jillian Hayes, DOB [birth year], MRN [id number]  PCP:  Jillian Lukes, MD Cardiologist:  Minus Breeding, MD 10/27/2017 in hospital Jillian Ferries, PA-C   Chief Complaint  Patient presents with  . Follow-up    History of Present Illness: Jillian Hayes is a 72 y.o. female with a history of HTN, Iron def anemia, CKD IV, HTN, depression, hypercalcemia, mod AI  Admitted 9/23-9/25/2019 for atrial fibrillation, RVR.  Patient spontaneously converted to sinus rhythm, was started on Cardizem and Coumadin.  Creatinine 2.21 at discharge 10/13 PCP visit,, patient eating healthier and feeling better, atrial fibrillation described as rate controlled.  INR was 2.5  Jillian Hayes presents for cardiology follow up.   She feels she has stayed in rhythm since leaving the hospital.   The onset of the arrhythmia was very sudden. She had had similar sx in the past, but they always resolved in a relatively short period of time. She had never been evaluated. She was very symptomatic with the atrial fib.   She has been very busy recently, has moved. They now live on the bottom floor of a 2 story home with her daughter living upstairs.  She and her husband normally walk for exercise and lift weights.   SBP 130s at home, she normally has a high BP at the doctor's office.   She has had no chest pain.  She has had no shortness of breath.  She thought if anything was going to make the A. fib come back, it would be everything they had to do for this move, but it did not.  She feels she is doing well, stable from a cardiac standpoint.  She tries very hard to eat a healthy diet with some renal restrictions.   Past Medical History:  Diagnosis Date  . Advanced care planning/counseling discussion 06/10/2014   05/31/2014 patient presents copy of HCP and Living Will  . Anemia    iron deficiency  . Anxiety   . Benign fundic gland polyps of stomach   .  Bladder polyps 06/25/2010  . Chronic headaches 06/24/2010  . Chronic renal insufficiency, stage 4 (severe) ([place]) 11/09/2015  . Depression 1991   hospitalized  . History of chicken pox 06/25/2010  . Hypercalcemia 02/18/2014  . Hypertension   . Insomnia 06/24/2010  . Multiple chemical sensitivity syndrome 06/25/2010  . Proteinuria 02/18/2014  . Renal insufficiency 03/26/2011  . Valvular heart disease 04/28/2016    Past Surgical History:  Procedure Laterality Date  . cyst on left breast removed Left 1991   benign  . NASAL SEPTUM SURGERY  1986   rhinoplasty  . polyps on bladder removed  1972   benign  . TONSILLECTOMY  1962    Current Outpatient Medications  Medication Sig Dispense Refill  . acetaminophen (TYLENOL) 500 MG tablet Take 500 mg by mouth every 6 (six) hours as needed for headache (pain).    . Alfalfa 250 MG TABS Take 1,500 mg by mouth 3 (three) times daily.    Marland Kitchen b complex vitamins capsule Take 1 capsule by mouth 2 (two) times daily with a meal.     . carvedilol (COREG) 3.125 MG tablet TAKE 1 TABLET BY MOUTH TWICE DAILY (Patient taking differently: Take 3.125 mg by mouth 2 (two) times daily. ) 180 tablet 1  . Cholecalciferol (VITAMIN D PO) Take by mouth daily.    Marland Kitchen diltiazem (CARDIZEM CD) 120 MG 24  hr capsule Take 1 capsule (120 mg total) by mouth daily. 30 capsule 0  . Flaxseed, Linseed, (FLAX SEED OIL PO) Take 1 capsule 2 (two) times daily by mouth.     . hydrALAZINE (APRESOLINE) 10 MG tablet Take 1 tablet (10 mg total) by mouth 3 (three) times daily. 270 tablet 1  . IRON PO Take 15 mg by mouth daily. Shaklee product    . L-GLUTAMINE PO Take 1 capsule by mouth daily after supper.     . Probiotic Product (PROBIOTIC DAILY PO) Take 1 tablet by mouth daily with lunch.     . vitamin C (ASCORBIC ACID) 500 MG tablet Take 500 mg by mouth daily as needed (immune system boost).     Marland Kitchen VITAMIN E PO Take 1 capsule by mouth daily with lunch.    . warfarin (COUMADIN) 5 MG tablet Take 1 to 1  and 1/2 tablets daily as directed by coumadin clinic 45 tablet 0   No current facility-administered medications for this visit.     Allergies:   Sulfa antibiotics    Social History:  The patient  reports that she has never smoked. She has never used smokeless tobacco. She reports that she does not drink alcohol or use drugs.   Family History:  The patient's family history includes Allergic rhinitis in her brother; Breast cancer (age of onset: 76) in her mother; Diabetes in her maternal aunt, maternal uncle, and mother; Heart attack (age of onset: 42) in her paternal grandfather; Heart attack (age of onset: 24) in her father; Heart disease in her father; Hypertension in her brother and paternal grandmother; Melanoma (age of onset: 92) in her son; Nephrolithiasis in her father; Obesity in her paternal grandmother; Pancreatic cancer in her maternal grandmother.  She indicated that her mother is deceased. She indicated that her father is deceased. She indicated that both of her brothers are alive. She indicated that her maternal grandmother is deceased. She indicated that her maternal grandfather is deceased. She indicated that her paternal grandmother is deceased. She indicated that her paternal grandfather is deceased. She indicated that her daughter is alive. She indicated that her son is alive. She indicated that the status of her maternal aunt is unknown. She indicated that the status of her maternal uncle is unknown. She indicated that the status of her neg hx is unknown.   ROS:  Please see the history of present illness. All other systems are reviewed and negative.    PHYSICAL EXAM: VS:  BP (!) 172/72   Pulse 70   Ht 5' 6.5" (1.689 m)   Wt 140 lb (63.5 kg)   BMI 22.26 kg/m  , BMI Body mass index is 22.26 kg/m. GEN: Well nourished, well developed, female in no acute distress HEENT: normal for age  Neck: no JVD, no carotid bruit, no masses Cardiac: RRR; 2/6 murmur, no rubs, or  gallops Respiratory:  clear to auscultation bilaterally, normal work of breathing GI: soft, nontender, nondistended, + BS MS: no deformity or atrophy; no edema; distal pulses are 2+ in all 4 extremities  Skin: warm and dry, no rash Neuro:  Strength and sensation are intact Psych: euthymic mood, full affect   EKG:  EKG is not ordered today.  ECHO: 10/26/2017 - Left ventricle: The cavity size was normal. Systolic function was   normal. The estimated ejection fraction was in the range of 60%   to 65%. Wall motion was normal; there were no regional wall   motion abnormalities.  The study was not technically sufficient to   allow evaluation of LV diastolic dysfunction due to atrial   fibrillation. - Aortic valve: Trileaflet; mildly thickened, mildly calcified   leaflets. There was moderate regurgitation. Valve area (VTI):   2.91 cm^2. Valve area (Vmax): 2.34 cm^2. Valve area (Vmean): 2.67   cm^2. - Left atrium: The atrium was mildly dilated. - Right atrium: The atrium was moderately dilated. - Tricuspid valve: There was mild-moderate regurgitation. - Pulmonic valve: There was trivial regurgitation. - Pulmonary arteries: Systolic pressure could not be accurately   estimated. - Inferior vena cava: The vessel was mildly dilated. The   respirophasic diameter changes were in the normal range (>= 50%). - Pericardium, extracardiac: A trivial pericardial effusion was   identified posterior to the heart.    Recent Labs: 10/25/2017: TSH 1.922 10/27/2017: Magnesium 2.0 11/08/2017: ALT 19; BUN 52; Creatinine, Ser 2.33; Hemoglobin 12.1; Platelets 238.0; Potassium 4.4; Sodium 137  CBC    Component Value Date/Time   WBC 5.3 11/08/2017 1203   RBC 4.43 11/08/2017 1203   HGB 12.1 11/08/2017 1203   HGB 10.8 (L) 03/25/2016 1111   HCT 36.8 11/08/2017 1203   HCT 33.5 (L) 03/25/2016 1111   PLT 238.0 11/08/2017 1203   PLT 254 03/25/2016 1111   MCV 83.1 11/08/2017 1203   MCV 89 03/25/2016 1111   MCH  27.0 10/27/2017 0432   MCHC 32.8 11/08/2017 1203   RDW 16.2 (H) 11/08/2017 1203   RDW 14.9 03/25/2016 1111   LYMPHSABS 1.5 11/08/2017 1203   LYMPHSABS 1.7 03/25/2016 1111   MONOABS 0.6 11/08/2017 1203   EOSABS 0.2 11/08/2017 1203   EOSABS 0.3 03/25/2016 1111   BASOSABS 0.1 11/08/2017 1203   BASOSABS 0.0 03/25/2016 1111   CMP Latest Ref Rng & Units 11/08/2017 10/27/2017 10/26/2017  Glucose 70 - 99 mg/dL 81 89 94  BUN 6 - 23 mg/dL 52(H) 45(H) 50(H)  Creatinine 0.40 - 1.20 mg/dL 2.33(H) 2.21(H) 2.21(H)  Sodium 135 - 145 mEq/L 137 140 141  Potassium 3.5 - 5.1 mEq/L 4.4 3.5 3.7  Chloride 96 - 112 mEq/L 103 111 110  CO2 19 - 32 mEq/L 27 22 22   Calcium 8.4 - 10.5 mg/dL 9.7 9.5 10.3  Total Protein 6.0 - 8.3 g/dL 6.1 5.1(L) -  Total Bilirubin 0.2 - 1.2 mg/dL 0.4 0.7 -  Alkaline Phos 39 - 117 U/L 66 43 -  AST 0 - 37 U/L 27 24 -  ALT 0 - 35 U/L 19 17 -     Lipid Panel    Component Value Date/Time   CHOL 192 10/26/2017 0325   TRIG 43 10/26/2017 0325   HDL 79 10/26/2017 0325   CHOLHDL 2.4 10/26/2017 0325   VLDL 9 10/26/2017 0325   LDLCALC 104 (H) 10/26/2017 0325     Wt Readings from Last 3 Encounters:  11/30/17 140 lb (63.5 kg)  11/08/17 141 lb 12.8 oz (64.3 kg)  10/27/17 142 lb 8 oz (64.6 kg)     Other studies Reviewed: Additional studies/ records that were reviewed today include: Hospital records and testing.  ASSESSMENT AND PLAN:  1.  PAF: She is doing well from a cardiac standpoint.  Continue carvedilol and warfarin.  2.  Hypertension: I explained that the lower her blood pressure, the less stress there is on her heart and her kidneys. - Her blood pressure is probably a little above target, even at home.  I will increase the carvedilol to 6.25 mg twice daily.  She  should tolerate that dose. - She is to track her blood pressure at home and let us know if it is above her goal blood pressure of 130/80.  3.  Chronic anticoagulation: Her Coumadin is followed by the Coumadin  clinic here and her INR today was 2.5. -She is not having any bleeding issues or concerns.   Current medicines are reviewed at length with the patient today.  The patient does not have concerns regarding medicines.  The following changes have been made: Increase Coreg  Labs/ tests ordered today include:  No orders of the defined types were placed in this encounter.    Disposition:   FU with Minus Breeding, MD  Signed, Jillian Ferries, PA-C  11/30/2017 3:56 PM    [place] Phone: [phone]; Fax: [phone]  This note was written with the assistance of speech recognition software.  Please excuse any transcriptional errors.

## 2017-12-16 ENCOUNTER — Ambulatory Visit (INDEPENDENT_AMBULATORY_CARE_PROVIDER_SITE_OTHER): Payer: Medicare HMO | Admitting: Pharmacist

## 2017-12-16 DIAGNOSIS — Z7901 Long term (current) use of anticoagulants: Secondary | ICD-10-CM | POA: Diagnosis not present

## 2017-12-16 DIAGNOSIS — I4891 Unspecified atrial fibrillation: Secondary | ICD-10-CM

## 2017-12-16 LAB — POCT INR: INR: 3 (ref 2.0–3.0)

## 2017-12-17 NOTE — Progress Notes (Addendum)
Subjective:   Jillian Hayes is a 72 y.o. female who presents for Medicare Annual (Subsequent) preventive examination.  Review of Systems: No ROS.  Medicare Wellness Visit. Additional risk factors are reflected in the social history. Cardiac Risk Factors include: advanced age (>72men, >38 women);dyslipidemia;hypertension Sleep patterns:  No issues Home Safety/Smoke Alarms: Feels safe in home. Smoke alarms in place. Lives with daughter and husband in 2 story townhouse. Walk in shower.  Female:        Mammo- utd       Dexa scan- utd       CCS- last 08/04/12-recall not filed. Pt report 5 yr recall. [place] yearly.     Objective:     Vitals: BP (!) 176/80 (BP Location: Right Arm, Cuff Size: Normal)   Pulse 67   Ht 5\' 7"  (1.702 m)   Wt 142 lb 6.4 oz (64.6 kg)   SpO2 96%   BMI 22.30 kg/m   Body mass index is 22.3 kg/m.   Wt Readings from Last 3 Encounters:  12/20/17 142 lb 6.4 oz (64.6 kg)  11/30/17 140 lb (63.5 kg)  11/08/17 141 lb 12.8 oz (64.3 kg)   Temp Readings from Last 3 Encounters:  11/08/17 97.9 F (36.6 C) (Oral)  10/27/17 97.7 F (36.5 C) (Oral)  04/06/17 (!) 97.5 F (36.4 C) (Oral)   BP Readings from Last 3 Encounters:  12/20/17 (!) 176/80  11/30/17 (!) 172/72  11/14/17 140/64   Pulse Readings from Last 3 Encounters:  12/20/17 67  11/30/17 70  11/08/17 71     Advanced Directives 10/26/2017 10/25/2017 12/17/2016 07/19/2016 03/25/2016 03/02/2016 02/15/2014  Does Patient Have a Medical Advance Directive? Yes Yes Yes Yes Yes No;Yes No  Type of Paramedic of [place];Living will [place];Living will [place];Living will Living will [place];Living will [place];Living will -  Does patient want to make changes to medical advance directive? No - Patient declined - - - - - -  Copy of [place] in Chart? Yes - Yes - No - copy  requested No - copy requested -  Would patient like information on creating a medical advance directive? - - - - - - Yes - Educational materials given    Tobacco Social History   Tobacco Use  Smoking Status Never Smoker  Smokeless Tobacco Never Used     Counseling given: Not Answered   Clinical Intake: Pain : No/denies pain    Past Medical History:  Diagnosis Date  . Advanced care planning/counseling discussion 06/10/2014   05/31/2014 patient presents copy of HCP and Living Will  . Anemia    iron deficiency  . Anxiety   . Atrial fibrillation ([place])   . Benign fundic gland polyps of stomach   . Bladder polyps 06/25/2010  . Chronic headaches 06/24/2010  . Chronic renal insufficiency, stage 4 (severe) ([place]) 11/09/2015  . Depression 1991   hospitalized  . History of chicken pox 06/25/2010  . Hypercalcemia 02/18/2014  . Hypertension   . Insomnia 06/24/2010  . Multiple chemical sensitivity syndrome 06/25/2010  . Proteinuria 02/18/2014  . Renal insufficiency 03/26/2011  . Valvular heart disease 04/28/2016   Past Surgical History:  Procedure Laterality Date  . cyst on left breast removed Left 1991   benign  . NASAL SEPTUM SURGERY  1986   rhinoplasty  . polyps on bladder removed  1972   benign  . TONSILLECTOMY  1962   Family History  Problem Relation Age of Onset  . Breast cancer Mother 8       left breast removed, 2010 lung  . Diabetes Mother   . Nephrolithiasis Father   . Heart attack Father 2       X 3  . Heart disease Father        smoker  . Hypertension Brother   . Allergic rhinitis Brother   . Melanoma Son 37       melanoma on leg removed  . Pancreatic cancer Maternal Grandmother   . Hypertension Paternal Grandmother   . Obesity Paternal Grandmother   . Heart attack Paternal Grandfather 36  . Diabetes Maternal Aunt        x 2  . Diabetes Maternal Uncle        x 3  . Asthma Neg Hx   . Eczema Neg Hx   . Urticaria Neg Hx   . Immunodeficiency Neg Hx   .  Angioedema Neg Hx    Social History   Socioeconomic History  . Marital status: Married    Spouse name: Not on file  . Number of children: 2  . Years of education: Not on file  . Highest education level: Not on file  Occupational History  . Occupation: retired  Scientific laboratory technician  . Financial resource strain: Not on file  . Food insecurity:    Worry: Never true    Inability: Never true  . Transportation needs:    Medical: No    Non-medical: No  Tobacco Use  . Smoking status: Never Smoker  . Smokeless tobacco: Never Used  Substance and Sexual Activity  . Alcohol use: No  . Drug use: No  . Sexual activity: Yes    Partners: Male  Lifestyle  . Physical activity:    Days per week: Not on file    Minutes per session: Not on file  . Stress: Not on file  Relationships  . Social connections:    Talks on phone: Not on file    Gets together: Not on file    Attends religious service: Not on file    Active member of club or organization: Not on file    Attends meetings of clubs or organizations: Not on file    Relationship status: Not on file  Other Topics Concern  . Not on file  Social History Narrative   Lives with husband.      Outpatient Encounter Medications as of 12/20/2017  Medication Sig  . acetaminophen (TYLENOL) 500 MG tablet Take 500 mg by mouth every 6 (six) hours as needed for headache (pain).  . Alfalfa 250 MG TABS Take 1,500 mg by mouth 3 (three) times daily.  Marland Kitchen b complex vitamins capsule Take 1 capsule by mouth 2 (two) times daily with a meal.   . carvedilol (COREG) 6.25 MG tablet Take 1 tablet (6.25 mg total) by mouth 2 (two) times daily.  . Cholecalciferol (VITAMIN D PO) Take by mouth daily.  Marland Kitchen diltiazem (CARDIZEM CD) 120 MG 24 hr capsule Take 1 capsule (120 mg total) by mouth daily.  . Flaxseed, Linseed, (FLAX SEED OIL PO) Take 1 capsule 2 (two) times daily by mouth.   . hydrALAZINE (APRESOLINE) 10 MG tablet Take 1 tablet (10 mg total) by mouth 3 (three) times  daily.  . IRON PO Take 15 mg by mouth daily. Shaklee product  . L-GLUTAMINE PO Take 1 capsule by mouth daily after supper.   Marland Kitchen  Probiotic Product (PROBIOTIC DAILY PO) Take 1 tablet by mouth daily with lunch.   . vitamin C (ASCORBIC ACID) 500 MG tablet Take 500 mg by mouth daily as needed (immune system boost).   Marland Kitchen VITAMIN E PO Take 1 capsule by mouth daily with lunch.  . warfarin (COUMADIN) 5 MG tablet Take 1 to 1 and 1/2 tablets daily as directed by coumadin clinic   No facility-administered encounter medications on file as of 12/20/2017.     Activities of Daily Living In your present state of health, do you have any difficulty performing the following activities: 12/20/2017 10/25/2017  Hearing? Y Y  Comment HAS BILATERAL HEARING AIDS. -  Vision? N N  Difficulty concentrating or making decisions? N N  Walking or climbing stairs? N N  Dressing or bathing? N N  Doing errands, shopping? N N  Preparing Food and eating ? N -  Using the Toilet? N -  In the past six months, have you accidently leaked urine? Y -  Comment wears pads -  Do you have problems with loss of bowel control? N -  Managing your Medications? N -  Managing your Finances? N -  Some recent data might be hidden    Patient Care Team: [name], MD as PCP - General (Family Medicine) Minus Breeding, MD as PCP - Cardiology (Cardiology) Minus Breeding, MD as Consulting Physician (Cardiology)    Assessment:   This is a routine wellness examination for Jillian Hayes. Physical assessment deferred to PCP.  Exercise Activities and Dietary recommendations Current Exercise Habits: The patient does not participate in regular exercise at present, Exercise limited by: None identified Diet (meal preparation, eat out, water intake, caffeinated beverages, dairy products, fruits and vegetables): in general, a "healthy" diet  , well balanced   Goals    . Increase physical activity    . Move into a larger house. (pt-stated)        Fall Risk Fall Risk  12/20/2017 12/17/2016 03/02/2016 06/24/2015 02/15/2014  Falls in the past year? 1 Yes Yes No No  Number falls in past yr: 0 1 1 - -  Injury with Fall? 0 No Yes - -  Risk Factor Category  - - High Fall Risk - -  Follow up - Falls prevention discussed;Education provided Education provided;Falls prevention discussed - -   Depression Screen PHQ 2/9 Scores 12/20/2017 12/17/2016 06/24/2015 02/15/2014  PHQ - 2 Score 0 1 0 0     Cognitive Function Ad8 score reviewed for issues:  Issues making decisions:no  Less interest in hobbies / activities:no  Repeats questions, stories (family complaining):no  Trouble using ordinary gadgets (microwave, computer, phone):no  Forgets the month or year: no  Mismanaging finances: no  Remembering appts:no  Daily problems with thinking and/or memory:no Ad8 score is=0   MMSE - Mini Mental State Exam 12/17/2016  Orientation to time 5  Orientation to Place 5  Registration 3  Attention/ Calculation 5  Recall 3  Language- name 2 objects 2  Language- repeat 1  Language- follow 3 step command 3  Language- read & follow direction 1  Write a sentence 1  Copy design 1  Total score 30        Immunization History  Administered Date(s) Administered  . Tdap 06/10/2012  . Zoster 01/03/2012   Screening Tests Health Maintenance  Topic Date Due  . PNA vac Low Risk Adult (1 of 2 - PCV13) 11/23/2010  . INFLUENZA VACCINE  09/02/2017  . MAMMOGRAM  10/21/2019  . TETANUS/TDAP  06/11/2022  . COLONOSCOPY  08/05/2022  . DEXA SCAN  Completed  . Hepatitis C Screening  Completed      Plan:    Please schedule your next medicare wellness visit with me in 1 yr.  Continue to eat heart healthy diet (full of fruits, vegetables, whole grains, lean protein, water--limit salt, fat, and sugar intake) and increase physical activity as tolerated.  Please consider scheduling a colonoscopy if it is time ( once life settles down a bit for  you).  Your BP was elevated today. Please take your medications as prescribed. Dr.Blyth  would like for you to take an extra dose of Carvedilol today and she will see you tomorrow. Seek emergency care if necessary as discussed.  I have personally reviewed and noted the following in the patient's chart:   . Medical and social history . Use of alcohol, tobacco or illicit drugs  . Current medications and supplements . Functional ability and status . Nutritional status . Physical activity . Advanced directives . List of other physicians . Hospitalizations, surgeries, and ER visits in previous 12 months . Vitals . Screenings to include cognitive, depression, and falls . Referrals and appointments  In addition, I have reviewed and discussed with patient certain preventive protocols, quality metrics, and best practice recommendations. A written personalized care plan for preventive services as well as general preventive health recommendations were provided to patient.     Shela Nevin, [place]  12/20/2017   Medical screening examination/treatment was performed by qualified clinical staff member and as supervising physician I was immediately available for consultation/collaboration. I have reviewed documentation and agree with assessment and plan.  [name], MD

## 2017-12-20 ENCOUNTER — Ambulatory Visit (INDEPENDENT_AMBULATORY_CARE_PROVIDER_SITE_OTHER): Payer: Medicare HMO | Admitting: *Deleted

## 2017-12-20 ENCOUNTER — Encounter: Payer: Self-pay | Admitting: *Deleted

## 2017-12-20 VITALS — BP 176/80 | HR 67 | Ht 67.0 in | Wt 142.4 lb

## 2017-12-20 DIAGNOSIS — Z0000 Encounter for general adult medical examination without abnormal findings: Secondary | ICD-10-CM

## 2017-12-20 NOTE — Patient Instructions (Addendum)
Please schedule your next medicare wellness visit with me in 1 yr.  Continue to eat heart healthy diet (full of fruits, vegetables, whole grains, lean protein, water--limit salt, fat, and sugar intake) and increase physical activity as tolerated.  Please consider scheduling a colonoscopy if it is time ( once life settles down a bit for you).  Your BP was elevated today. Please take your medications as prescribed. Dr.Blyth would like for you to take an extra dose of Carvedilol today and she will see you tomorrow. Seek emergency care if necessary as discussed.    Jillian Hayes , Thank you for taking time to come for your Medicare Wellness Visit. I appreciate your ongoing commitment to your health goals. Please review the following plan we discussed and let me know if I can assist you in the future.   These are the goals we discussed: Goals    . Increase physical activity    . Move into a larger house. (pt-stated)       This is a list of the screening recommended for you and due dates:  Health Maintenance  Topic Date Due  . Pneumonia vaccines (1 of 2 - PCV13) 11/23/2010  . Flu Shot  09/02/2017  . Mammogram  10/21/2019  . Tetanus Vaccine  06/11/2022  . Colon Cancer Screening  08/05/2022  . DEXA scan (bone density measurement)  Completed  .  Hepatitis C: One time screening is recommended by Center for Disease Control  (CDC) for  adults born from [birth year] through 1965.   Completed    Hypertension Hypertension is another name for high blood pressure. High blood pressure forces your heart to work harder to pump blood. This can cause problems over time. There are two numbers in a blood pressure reading. There is a top number (systolic) over a bottom number (diastolic). It is best to have a blood pressure below 120/80. Healthy choices can help lower your blood pressure. You may need medicine to help lower your blood pressure if:  Your blood pressure cannot be lowered with healthy choices.  Your  blood pressure is higher than 130/80.  Follow these instructions at home: Eating and drinking  If directed, follow the DASH eating plan. This diet includes: ? Filling half of your plate at each meal with fruits and vegetables. ? Filling one quarter of your plate at each meal with whole grains. Whole grains include whole wheat pasta, brown rice, and whole grain bread. ? Eating or drinking low-fat dairy products, such as skim milk or low-fat yogurt. ? Filling one quarter of your plate at each meal with low-fat (lean) proteins. Low-fat proteins include fish, skinless chicken, eggs, beans, and tofu. ? Avoiding fatty meat, cured and processed meat, or chicken with skin. ? Avoiding premade or processed food.  Eat less than 1,500 mg of salt (sodium) a day.  Limit alcohol use to no more than 1 drink a day for nonpregnant women and 2 drinks a day for men. One drink equals 12 oz of beer, 5 oz of wine, or 1 oz of hard liquor. Lifestyle  Work with your doctor to stay at a healthy weight or to lose weight. Ask your doctor what the best weight is for you.  Get at least 30 minutes of exercise that causes your heart to beat faster (aerobic exercise) most days of the week. This may include walking, swimming, or biking.  Get at least 30 minutes of exercise that strengthens your muscles (resistance exercise) at least 3 days  a week. This may include lifting weights or pilates.  Do not use any products that contain nicotine or tobacco. This includes cigarettes and e-cigarettes. If you need help quitting, ask your doctor.  Check your blood pressure at home as told by your doctor.  Keep all follow-up visits as told by your doctor. This is important. Medicines  Take over-the-counter and prescription medicines only as told by your doctor. Follow directions carefully.  Do not skip doses of blood pressure medicine. The medicine does not work as well if you skip doses. Skipping doses also puts you at risk for  problems.  Ask your doctor about side effects or reactions to medicines that you should watch for. Contact a doctor if:  You think you are having a reaction to the medicine you are taking.  You have headaches that keep coming back (recurring).  You feel dizzy.  You have swelling in your ankles.  You have trouble with your vision. Get help right away if:  You get a very bad headache.  You start to feel confused.  You feel weak or numb.  You feel faint.  You get very bad pain in your: ? Chest. ? Belly (abdomen).  You throw up (vomit) more than once.  You have trouble breathing. Summary  Hypertension is another name for high blood pressure.  Making healthy choices can help lower blood pressure. If your blood pressure cannot be controlled with healthy choices, you may need to take medicine. This information is not intended to replace advice given to you by your health care provider. Make sure you discuss any questions you have with your health care provider. Document Released: 07/08/2007 Document Revised: 12/18/2015 Document Reviewed: 12/18/2015 Elsevier Interactive Patient Education  2018 [employer] Maintenance for Postmenopausal Women Menopause is a normal process in which your reproductive ability comes to an end. This process happens gradually over a span of months to years, usually between the ages of 69 and 31. Menopause is complete when you have missed 12 consecutive menstrual periods. It is important to talk with your health care provider about some of the most common conditions that affect postmenopausal women, such as heart disease, cancer, and bone loss (osteoporosis). Adopting a healthy lifestyle and getting preventive care can help to promote your health and wellness. Those actions can also lower your chances of developing some of these common conditions. What should I know about menopause? During menopause, you may experience a number of symptoms,  such as:  Moderate-to-severe hot flashes.  Night sweats.  Decrease in sex drive.  Mood swings.  Headaches.  Tiredness.  Irritability.  Memory problems.  Insomnia.  Choosing to treat or not to treat menopausal changes is an individual decision that you make with your health care provider. What should I know about hormone replacement therapy and supplements? Hormone therapy products are effective for treating symptoms that are associated with menopause, such as hot flashes and night sweats. Hormone replacement carries certain risks, especially as you become older. If you are thinking about using estrogen or estrogen with progestin treatments, discuss the benefits and risks with your health care provider. What should I know about heart disease and stroke? Heart disease, heart attack, and stroke become more likely as you age. This may be due, in part, to the hormonal changes that your body experiences during menopause. These can affect how your body processes dietary fats, triglycerides, and cholesterol. Heart attack and stroke are both medical emergencies. There are many things that  you can do to help prevent heart disease and stroke:  Have your blood pressure checked at least every 1-2 years. High blood pressure causes heart disease and increases the risk of stroke.  If you are 8-7 years old, ask your health care provider if you should take aspirin to prevent a heart attack or a stroke.  Do not use any tobacco products, including cigarettes, chewing tobacco, or electronic cigarettes. If you need help quitting, ask your health care provider.  It is important to eat a healthy diet and maintain a healthy weight. ? Be sure to include plenty of vegetables, fruits, low-fat dairy products, and lean protein. ? Avoid eating foods that are high in solid fats, added sugars, or salt (sodium).  Get regular exercise. This is one of the most important things that you can do for your  health. ? Try to exercise for at least 150 minutes each week. The type of exercise that you do should increase your heart rate and make you sweat. This is known as moderate-intensity exercise. ? Try to do strengthening exercises at least twice each week. Do these in addition to the moderate-intensity exercise.  Know your numbers.Ask your health care provider to check your cholesterol and your blood glucose. Continue to have your blood tested as directed by your health care provider.  What should I know about cancer screening? There are several types of cancer. Take the following steps to reduce your risk and to catch any cancer development as early as possible. Breast Cancer  Practice breast self-awareness. ? This means understanding how your breasts normally appear and feel. ? It also means doing regular breast self-exams. Let your health care provider know about any changes, no matter how small.  If you are 66 or older, have a clinician do a breast exam (clinical breast exam or CBE) every year. Depending on your age, family history, and medical history, it may be recommended that you also have a yearly breast X-ray (mammogram).  If you have a family history of breast cancer, talk with your health care provider about genetic screening.  If you are at high risk for breast cancer, talk with your health care provider about having an MRI and a mammogram every year.  Breast cancer (BRCA) gene test is recommended for women who have family members with BRCA-related cancers. Results of the assessment will determine the need for genetic counseling and BRCA1 and for BRCA2 testing. BRCA-related cancers include these types: ? Breast. This occurs in males or females. ? Ovarian. ? Tubal. This may also be called fallopian tube cancer. ? Cancer of the abdominal or pelvic lining (peritoneal cancer). ? Prostate. ? Pancreatic.  Cervical, Uterine, and Ovarian Cancer Your health care provider may recommend  that you be screened regularly for cancer of the pelvic organs. These include your ovaries, uterus, and vagina. This screening involves a pelvic exam, which includes checking for microscopic changes to the surface of your cervix (Pap test).  For women ages 21-65, health care providers may recommend a pelvic exam and a Pap test every three years. For women ages 32-65, they may recommend the Pap test and pelvic exam, combined with testing for human papilloma virus (HPV), every five years. Some types of HPV increase your risk of cervical cancer. Testing for HPV may also be done on women of any age who have unclear Pap test results.  Other health care providers may not recommend any screening for nonpregnant women who are considered low risk for pelvic  cancer and have no symptoms. Ask your health care provider if a screening pelvic exam is right for you.  If you have had past treatment for cervical cancer or a condition that could lead to cancer, you need Pap tests and screening for cancer for at least 20 years after your treatment. If Pap tests have been discontinued for you, your risk factors (such as having a new sexual partner) need to be reassessed to determine if you should start having screenings again. Some women have medical problems that increase the chance of getting cervical cancer. In these cases, your health care provider may recommend that you have screening and Pap tests more often.  If you have a family history of uterine cancer or ovarian cancer, talk with your health care provider about genetic screening.  If you have vaginal bleeding after reaching menopause, tell your health care provider.  There are currently no reliable tests available to screen for ovarian cancer.  Lung Cancer Lung cancer screening is recommended for adults 66-69 years old who are at high risk for lung cancer because of a history of smoking. A yearly low-dose CT scan of the lungs is recommended if you:  Currently  smoke.  Have a history of at least 30 pack-years of smoking and you currently smoke or have quit within the past 15 years. A pack-year is smoking an average of one pack of cigarettes per day for one year.  Yearly screening should:  Continue until it has been 15 years since you quit.  Stop if you develop a health problem that would prevent you from having lung cancer treatment.  Colorectal Cancer  This type of cancer can be detected and can often be prevented.  Routine colorectal cancer screening usually begins at age 30 and continues through age 48.  If you have risk factors for colon cancer, your health care provider may recommend that you be screened at an earlier age.  If you have a family history of colorectal cancer, talk with your health care provider about genetic screening.  Your health care provider may also recommend using home test kits to check for hidden blood in your stool.  A small camera at the end of a tube can be used to examine your colon directly (sigmoidoscopy or colonoscopy). This is done to check for the earliest forms of colorectal cancer.  Direct examination of the colon should be repeated every 5-10 years until age 61. However, if early forms of precancerous polyps or small growths are found or if you have a family history or genetic risk for colorectal cancer, you may need to be screened more often.  Skin Cancer  Check your skin from head to toe regularly.  Monitor any moles. Be sure to tell your health care provider: ? About any new moles or changes in moles, especially if there is a change in a mole's shape or color. ? If you have a mole that is larger than the size of a pencil eraser.  If any of your family members has a history of skin cancer, especially at a young age, talk with your health care provider about genetic screening.  Always use sunscreen. Apply sunscreen liberally and repeatedly throughout the day.  Whenever you are outside, protect  yourself by wearing long sleeves, pants, a wide-brimmed hat, and sunglasses.  What should I know about osteoporosis? Osteoporosis is a condition in which bone destruction happens more quickly than new bone creation. After menopause, you may be at an increased  risk for osteoporosis. To help prevent osteoporosis or the bone fractures that can happen because of osteoporosis, the following is recommended:  If you are 71-27 years old, get at least 1,000 mg of calcium and at least 600 mg of vitamin D per day.  If you are older than age 27 but younger than age 71, get at least 1,200 mg of calcium and at least 600 mg of vitamin D per day.  If you are older than age 80, get at least 1,200 mg of calcium and at least 800 mg of vitamin D per day.  Smoking and excessive alcohol intake increase the risk of osteoporosis. Eat foods that are rich in calcium and vitamin D, and do weight-bearing exercises several times each week as directed by your health care provider. What should I know about how menopause affects my mental health? Depression may occur at any age, but it is more common as you become older. Common symptoms of depression include:  Low or sad mood.  Changes in sleep patterns.  Changes in appetite or eating patterns.  Feeling an overall lack of motivation or enjoyment of activities that you previously enjoyed.  Frequent crying spells.  Talk with your health care provider if you think that you are experiencing depression. What should I know about immunizations? It is important that you get and maintain your immunizations. These include:  Tetanus, diphtheria, and pertussis (Tdap) booster vaccine.  Influenza every year before the flu season begins.  Pneumonia vaccine.  Shingles vaccine.  Your health care provider may also recommend other immunizations. This information is not intended to replace advice given to you by your health care provider. Make sure you discuss any questions you  have with your health care provider. Document Released: 03/13/2005 Document Revised: 08/09/2015 Document Reviewed: 10/23/2014 Elsevier Interactive Patient Education  2018 [employer].

## 2017-12-21 ENCOUNTER — Ambulatory Visit (INDEPENDENT_AMBULATORY_CARE_PROVIDER_SITE_OTHER): Payer: Medicare HMO | Admitting: Family Medicine

## 2017-12-21 ENCOUNTER — Encounter: Payer: Self-pay | Admitting: Family Medicine

## 2017-12-21 VITALS — BP 150/78 | HR 66 | Temp 97.4°F | Resp 18 | Ht 66.0 in | Wt 141.0 lb

## 2017-12-21 DIAGNOSIS — I480 Paroxysmal atrial fibrillation: Secondary | ICD-10-CM | POA: Diagnosis not present

## 2017-12-21 DIAGNOSIS — F32A Depression, unspecified: Secondary | ICD-10-CM

## 2017-12-21 DIAGNOSIS — F329 Major depressive disorder, single episode, unspecified: Secondary | ICD-10-CM

## 2017-12-21 DIAGNOSIS — I10 Essential (primary) hypertension: Secondary | ICD-10-CM

## 2017-12-21 DIAGNOSIS — N184 Chronic kidney disease, stage 4 (severe): Secondary | ICD-10-CM | POA: Diagnosis not present

## 2017-12-21 DIAGNOSIS — D649 Anemia, unspecified: Secondary | ICD-10-CM

## 2017-12-21 DIAGNOSIS — R69 Illness, unspecified: Secondary | ICD-10-CM | POA: Diagnosis not present

## 2017-12-21 DIAGNOSIS — E782 Mixed hyperlipidemia: Secondary | ICD-10-CM | POA: Diagnosis not present

## 2017-12-21 DIAGNOSIS — N289 Disorder of kidney and ureter, unspecified: Secondary | ICD-10-CM

## 2017-12-21 MED ORDER — CARVEDILOL 12.5 MG PO TABS: 12.5000 mg | ORAL_TABLET | Freq: Two times a day (BID) | ORAL | 3 refills | Status: DC

## 2017-12-21 NOTE — Assessment & Plan Note (Signed)
Has been a great deal of stress lately secondary to moving in with their daughter last month. Her blood pressure has been up. She has noted some fatigue and headaches as well

## 2017-12-21 NOTE — Patient Instructions (Signed)

## 2017-12-21 NOTE — Assessment & Plan Note (Signed)
Following with Dr Audie Clear of nephrology.

## 2017-12-21 NOTE — Assessment & Plan Note (Signed)
Increase Carvedilol to 12.5 mg twice daily.

## 2017-12-21 NOTE — Assessment & Plan Note (Signed)
Rate controlled and tolerating Warfarin

## 2017-12-21 NOTE — Assessment & Plan Note (Signed)
Encouraged heart healthy diet, increase exercise, avoid trans fats, consider a krill oil cap daily 

## 2017-12-21 NOTE — Progress Notes (Signed)
Subjective:    Patient ID: Jillian Hayes, female    DOB: Aug 16, 1945, 72 y.o.   MRN: [id number]  Chief Complaint  Patient presents with  . Follow-up    HPI Patient is in today for follow up. She has been moving in with her duaghter this past month and working hard. No recent febrile illness or hospitalizations. Is trying to maintain a heart healthy diet and stay active. Denies CP/palp/SOB/HA/congestion/fevers/GI or GU c/o. Taking meds as prescribed  Past Medical History:  Diagnosis Date  . Advanced care planning/counseling discussion 06/10/2014   05/31/2014 patient presents copy of HCP and Living Will  . Anemia    iron deficiency  . Anxiety   . Atrial fibrillation ([place])   . Benign fundic gland polyps of stomach   . Bladder polyps 06/25/2010  . Chronic headaches 06/24/2010  . Chronic renal insufficiency, stage 4 (severe) ([place]) 11/09/2015  . Depression 1991   hospitalized  . History of chicken pox 06/25/2010  . Hypercalcemia 02/18/2014  . Hypertension   . Insomnia 06/24/2010  . Multiple chemical sensitivity syndrome 06/25/2010  . Proteinuria 02/18/2014  . Renal insufficiency 03/26/2011  . Valvular heart disease 04/28/2016    Past Surgical History:  Procedure Laterality Date  . cyst on left breast removed Left 1991   benign  . NASAL SEPTUM SURGERY  1986   rhinoplasty  . polyps on bladder removed  1972   benign  . TONSILLECTOMY  1962    Family History  Problem Relation Age of Onset  . Breast cancer Mother 69       left breast removed, 2010 lung  . Diabetes Mother   . Nephrolithiasis Father   . Heart attack Father 58       X 3  . Heart disease Father        smoker  . Hypertension Brother   . Allergic rhinitis Brother   . Melanoma Son 37       melanoma on leg removed  . Pancreatic cancer Maternal Grandmother   . Hypertension Paternal Grandmother   . Obesity Paternal Grandmother   . Heart attack Paternal Grandfather 2  . Diabetes Maternal Aunt        x 2  . Diabetes  Maternal Uncle        x 3  . Asthma Neg Hx   . Eczema Neg Hx   . Urticaria Neg Hx   . Immunodeficiency Neg Hx   . Angioedema Neg Hx     Social History   Socioeconomic History  . Marital status: Married    Spouse name: Not on file  . Number of children: 2  . Years of education: Not on file  . Highest education level: Not on file  Occupational History  . Occupation: retired  Scientific laboratory technician  . Financial resource strain: Not on file  . Food insecurity:    Worry: Never true    Inability: Never true  . Transportation needs:    Medical: No    Non-medical: No  Tobacco Use  . Smoking status: Never Smoker  . Smokeless tobacco: Never Used  Substance and Sexual Activity  . Alcohol use: No  . Drug use: No  . Sexual activity: Yes    Partners: Male  Lifestyle  . Physical activity:    Days per week: Not on file    Minutes per session: Not on file  . Stress: Not on file  Relationships  . Social connections:    Talks on  phone: Not on file    Gets together: Not on file    Attends religious service: Not on file    Active member of club or organization: Not on file    Attends meetings of clubs or organizations: Not on file    Relationship status: Not on file  . Intimate partner violence:    Fear of current or ex partner: No    Emotionally abused: No    Physically abused: No    Forced sexual activity: No  Other Topics Concern  . Not on file  Social History Narrative   Lives with husband.      Outpatient Medications Prior to Visit  Medication Sig Dispense Refill  . acetaminophen (TYLENOL) 500 MG tablet Take 500 mg by mouth every 6 (six) hours as needed for headache (pain).    . Alfalfa 250 MG TABS Take 1,500 mg by mouth 3 (three) times daily.    Marland Kitchen b complex vitamins capsule Take 1 capsule by mouth 2 (two) times daily with a meal.     . Cholecalciferol (VITAMIN D PO) Take by mouth daily.    Marland Kitchen diltiazem (CARDIZEM CD) 120 MG 24 hr capsule Take 1 capsule (120 mg total) by mouth  daily. 30 capsule 0  . Flaxseed, Linseed, (FLAX SEED OIL PO) Take 1 capsule 2 (two) times daily by mouth.     . hydrALAZINE (APRESOLINE) 10 MG tablet Take 1 tablet (10 mg total) by mouth 3 (three) times daily. 270 tablet 1  . IRON PO Take 15 mg by mouth daily. [place] product    . L-GLUTAMINE PO Take 1 capsule by mouth daily after supper.     . Probiotic Product (PROBIOTIC DAILY PO) Take 1 tablet by mouth daily with lunch.     . vitamin C (ASCORBIC ACID) 500 MG tablet Take 500 mg by mouth daily as needed (immune system boost).     Marland Kitchen VITAMIN E PO Take 1 capsule by mouth daily with lunch.    . warfarin (COUMADIN) 5 MG tablet Take 1 to 1 and 1/2 tablets daily as directed by coumadin clinic 45 tablet 0  . carvedilol (COREG) 6.25 MG tablet Take 1 tablet (6.25 mg total) by mouth 2 (two) times daily. 180 tablet 3   No facility-administered medications prior to visit.     Allergies  Allergen Reactions  . Sulfa Antibiotics Nausea And Vomiting    Review of Systems  Constitutional: Negative for fever and malaise/fatigue.  HENT: Negative for congestion.   Eyes: Negative for blurred vision.  Respiratory: Negative for shortness of breath.   Cardiovascular: Negative for chest pain, palpitations and leg swelling.  Gastrointestinal: Negative for abdominal pain, blood in stool and nausea.  Genitourinary: Negative for dysuria and frequency.  Musculoskeletal: Negative for falls.  Skin: Negative for rash.  Neurological: Positive for headaches. Negative for dizziness and loss of consciousness.  Endo/Heme/Allergies: Negative for environmental allergies.  Psychiatric/Behavioral: Negative for depression. The patient is nervous/anxious.        Objective:    Physical Exam  Constitutional: She is oriented to person, place, and time. She appears well-developed and well-nourished. No distress.  HENT:  Head: Normocephalic and atraumatic.  Nose: Nose normal.  Eyes: Right eye exhibits no discharge. Left eye  exhibits no discharge.  Neck: Normal range of motion. Neck supple.  Cardiovascular: Normal rate and regular rhythm.  No murmur heard. Pulmonary/Chest: Effort normal and breath sounds normal.  Abdominal: Soft. Bowel sounds are normal. There is no tenderness.  Musculoskeletal: She exhibits no edema.  Neurological: She is alert and oriented to person, place, and time.  Skin: Skin is warm and dry.  Psychiatric: She has a normal mood and affect.  Nursing note and vitals reviewed.   BP (!) 178/88 (BP Location: Left Arm, Patient Position: Sitting, Cuff Size: Normal)   Pulse 66   Temp (!) 97.4 F (36.3 C)   Resp 18   Ht 5' 6"  (1.676 m)   Wt 141 lb (64 kg)   SpO2 95%   BMI 22.76 kg/m  Wt Readings from Last 3 Encounters:  12/21/17 141 lb (64 kg)  12/20/17 142 lb 6.4 oz (64.6 kg)  11/30/17 140 lb (63.5 kg)     Lab Results  Component Value Date   WBC 5.3 11/08/2017   HGB 12.1 11/08/2017   HCT 36.8 11/08/2017   PLT 238.0 11/08/2017   GLUCOSE 81 11/08/2017   CHOL 192 10/26/2017   TRIG 43 10/26/2017   HDL 79 10/26/2017   LDLCALC 104 (H) 10/26/2017   ALT 19 11/08/2017   AST 27 11/08/2017   NA 137 11/08/2017   K 4.4 11/08/2017   CL 103 11/08/2017   CREATININE 2.33 (H) 11/08/2017   BUN 52 (H) 11/08/2017   CO2 27 11/08/2017   TSH 1.922 10/25/2017   INR 3.0 12/16/2017   HGBA1C 5.7 (H) 10/25/2017    Lab Results  Component Value Date   TSH 1.922 10/25/2017   Lab Results  Component Value Date   WBC 5.3 11/08/2017   HGB 12.1 11/08/2017   HCT 36.8 11/08/2017   MCV 83.1 11/08/2017   PLT 238.0 11/08/2017   Lab Results  Component Value Date   NA 137 11/08/2017   K 4.4 11/08/2017   CHLORIDE 109 03/25/2016   CO2 27 11/08/2017   GLUCOSE 81 11/08/2017   BUN 52 (H) 11/08/2017   CREATININE 2.33 (H) 11/08/2017   BILITOT 0.4 11/08/2017   ALKPHOS 66 11/08/2017   AST 27 11/08/2017   ALT 19 11/08/2017   PROT 6.1 11/08/2017   ALBUMIN 3.8 11/08/2017   CALCIUM 9.7 11/08/2017    ANIONGAP 7 10/27/2017   EGFR 20 (L) 03/25/2016   GFR 21.83 (L) 11/08/2017   Lab Results  Component Value Date   CHOL 192 10/26/2017   Lab Results  Component Value Date   HDL 79 10/26/2017   Lab Results  Component Value Date   LDLCALC 104 (H) 10/26/2017   Lab Results  Component Value Date   TRIG 43 10/26/2017   Lab Results  Component Value Date   CHOLHDL 2.4 10/26/2017   Lab Results  Component Value Date   HGBA1C 5.7 (H) 10/25/2017       Assessment & Plan:   Problem List Items Addressed This Visit    Anemia - Primary   Relevant Orders   CBC   Depression    Has been a great deal of stress lately secondary to moving in with their daughter last month. Her blood pressure has been up. She has noted some fatigue and headaches as well      HTN (hypertension)    Increase Carvedilol to 12.5 mg twice daily      Relevant Medications   carvedilol (COREG) 12.5 MG tablet   Chronic renal insufficiency, stage 4 (severe) ([facility])    Following with Dr Audie Clear of nephrology.       Hyperlipidemia    Encouraged heart healthy diet, increase exercise, avoid trans fats, consider a krill oil cap  daily      Relevant Medications   carvedilol (COREG) 12.5 MG tablet   Atrial fibrillation ([facility])    Rate controlled and tolerating Warfarin      Relevant Medications   carvedilol (COREG) 12.5 MG tablet    Other Visit Diagnoses    Renal insufficiency       Relevant Orders   Comprehensive metabolic panel      I have discontinued Ayra S. Corsino's carvedilol. I am also having her start on carvedilol. Additionally, I am having her maintain her vitamin C, (Flaxseed, Linseed, (FLAX SEED OIL PO)), b complex vitamins, L-GLUTAMINE PO, Probiotic Product (PROBIOTIC DAILY PO), Alfalfa, IRON PO, VITAMIN E PO, acetaminophen, hydrALAZINE, warfarin, diltiazem, and Cholecalciferol (VITAMIN D PO).  Meds ordered this encounter  Medications  . carvedilol (COREG) 12.5 MG tablet    Sig: Take 1 tablet  (12.5 mg total) by mouth 2 (two) times daily with a meal.    Dispense:  60 tablet    Refill:  3     [name], MD

## 2017-12-22 LAB — COMPREHENSIVE METABOLIC PANEL
ALT: 21 U/L (ref 0–35)
AST: 27 U/L (ref 0–37)
Albumin: 3.4 g/dL — ABNORMAL LOW (ref 3.5–5.2)
Alkaline Phosphatase: 67 U/L (ref 39–117)
BILIRUBIN TOTAL: 0.4 mg/dL (ref 0.2–1.2)
BUN: 49 mg/dL — ABNORMAL HIGH (ref 6–23)
CALCIUM: 9.7 mg/dL (ref 8.4–10.5)
CHLORIDE: 104 meq/L (ref 96–112)
CO2: 27 meq/L (ref 19–32)
Creatinine, Ser: 2.34 mg/dL — ABNORMAL HIGH (ref 0.40–1.20)
GFR: 21.71 mL/min — ABNORMAL LOW (ref >60.00)
Glucose, Bld: 90 mg/dL (ref 70–99)
Potassium: 4.5 mEq/L (ref 3.5–5.1)
Sodium: 138 mEq/L (ref 135–145)
Total Protein: 5.9 g/dL — ABNORMAL LOW (ref 6.0–8.3)

## 2017-12-22 LAB — CBC
HEMATOCRIT: 33.4 % — AB (ref 36.0–46.0)
Hemoglobin: 10.9 g/dL — ABNORMAL LOW (ref 12.0–15.0)
MCHC: 32.5 g/dL (ref 30.0–36.0)
MCV: 82.9 fl (ref 78.0–100.0)
Platelets: 245 10*3/uL (ref 150.0–400.0)
RBC: 4.03 Mil/uL (ref 3.87–5.11)
RDW: 16.7 % — ABNORMAL HIGH (ref 11.5–15.5)
WBC: 5.4 10*3/uL (ref 4.0–10.5)

## 2017-12-23 ENCOUNTER — Other Ambulatory Visit: Payer: Self-pay | Admitting: Cardiology

## 2017-12-23 DIAGNOSIS — R69 Illness, unspecified: Secondary | ICD-10-CM | POA: Diagnosis not present

## 2017-12-28 ENCOUNTER — Ambulatory Visit (INDEPENDENT_AMBULATORY_CARE_PROVIDER_SITE_OTHER): Payer: Medicare HMO | Admitting: Family Medicine

## 2017-12-28 VITALS — BP 152/75 | HR 58

## 2017-12-28 DIAGNOSIS — I10 Essential (primary) hypertension: Secondary | ICD-10-CM

## 2017-12-28 NOTE — Progress Notes (Signed)
Pre visit review using our clinic tool,if applicable. No additional management support is needed unless otherwise documented below in the visit note.   Pt here for Blood pressure check per order from Dr. Charlett Blake dated 11/196/19.  Pt currently takes: Carvedilol 12.5 mg bid and Diltiazem 120 mg daily.   Pt reports compliance with medication. No complaints voiced this visit.  BP today @ = 152/75 HR =58  Pt advised per Dr. Charlett Blake to continue taking medications as ordered and return in 2 weeks for BP check. Patient agreed and appointment scheduled.

## 2017-12-31 DIAGNOSIS — N184 Chronic kidney disease, stage 4 (severe): Secondary | ICD-10-CM | POA: Diagnosis not present

## 2017-12-31 DIAGNOSIS — E213 Hyperparathyroidism, unspecified: Secondary | ICD-10-CM | POA: Diagnosis not present

## 2018-01-03 ENCOUNTER — Other Ambulatory Visit: Payer: Self-pay | Admitting: Cardiology

## 2018-01-04 DIAGNOSIS — E213 Hyperparathyroidism, unspecified: Secondary | ICD-10-CM | POA: Diagnosis not present

## 2018-01-04 DIAGNOSIS — I129 Hypertensive chronic kidney disease with stage 1 through stage 4 chronic kidney disease, or unspecified chronic kidney disease: Secondary | ICD-10-CM | POA: Diagnosis not present

## 2018-01-04 DIAGNOSIS — N184 Chronic kidney disease, stage 4 (severe): Secondary | ICD-10-CM | POA: Diagnosis not present

## 2018-01-06 ENCOUNTER — Ambulatory Visit (INDEPENDENT_AMBULATORY_CARE_PROVIDER_SITE_OTHER): Payer: Medicare HMO

## 2018-01-06 DIAGNOSIS — Z7901 Long term (current) use of anticoagulants: Secondary | ICD-10-CM | POA: Diagnosis not present

## 2018-01-06 DIAGNOSIS — I4891 Unspecified atrial fibrillation: Secondary | ICD-10-CM

## 2018-01-06 LAB — POCT INR: INR: 1.9 — AB (ref 2.0–3.0)

## 2018-01-06 NOTE — Patient Instructions (Signed)
Description   Take 1.5 tablets today, then continue on same dosage 1 tablet daily. Repeat INR in 3 weeks.

## 2018-01-12 ENCOUNTER — Ambulatory Visit (INDEPENDENT_AMBULATORY_CARE_PROVIDER_SITE_OTHER): Payer: Medicare HMO | Admitting: Internal Medicine

## 2018-01-12 VITALS — BP 151/68 | HR 61

## 2018-01-12 DIAGNOSIS — I10 Essential (primary) hypertension: Secondary | ICD-10-CM | POA: Diagnosis not present

## 2018-01-12 NOTE — Progress Notes (Addendum)
Pre visit review using our clinic tool,if applicable. No additional management support is needed unless otherwise documented below in the visit note.   Pt here for Blood pressure check per order from Dr. Charlett Blake dated 12/28/17 Pt currently takes:   Pt reports compliance with medication. Patient has taken medication today and states she has not had any problems since last visit.   BP today @ = 151/68 HR = 61  Pt advised per Dr. Larose Kells patient to increase Carvedilol 12.5 mg to  12.5 tablets bid and continue all other medications as ordered.  Return in 2 weeks for BP check. Appointment scheduled.   Kathlene November, MD

## 2018-01-21 ENCOUNTER — Ambulatory Visit (INDEPENDENT_AMBULATORY_CARE_PROVIDER_SITE_OTHER): Payer: Medicare HMO

## 2018-01-21 VITALS — BP 160/69 | HR 53

## 2018-01-21 DIAGNOSIS — I10 Essential (primary) hypertension: Secondary | ICD-10-CM

## 2018-01-21 MED ORDER — HYDRALAZINE HCL 10 MG PO TABS: 20.0000 mg | ORAL_TABLET | Freq: Two times a day (BID) | ORAL | 1 refills | Status: DC

## 2018-01-21 NOTE — Progress Notes (Signed)
   Subjective:    Patient ID: Jillian Hayes, female    DOB: 07/05/1945, 72 y.o.   MRN: [id number]  HPI    Review of Systems     Objective:   Physical Exam        Assessment & Plan:

## 2018-01-21 NOTE — Progress Notes (Addendum)
Pre visit review using our clinic tool,if applicable. No additional management support is needed unless otherwise documented below in the visit note.   Pt here for Blood pressure check per order dated 01/12/18.  Pt currently takes: Carvedilol 12.5 mg bid,Cartia XT 120 mg and Hydralizine 10 mg bid.  Pt reports compliance with medication.  BP today  = 160/69 HR = 53  Pt advised per Dr. Charlett Blake to increase Hydralizine to 20 mg bid continue all other medications and return in 1--14 days for re-check.  Patient agreed appointment scheduled.    Nursing blood pressure check note reviewed. Agree with documention and plan.

## 2018-01-28 ENCOUNTER — Ambulatory Visit (INDEPENDENT_AMBULATORY_CARE_PROVIDER_SITE_OTHER): Payer: Medicare HMO | Admitting: Pharmacist

## 2018-01-28 DIAGNOSIS — I4891 Unspecified atrial fibrillation: Secondary | ICD-10-CM

## 2018-01-28 DIAGNOSIS — Z7901 Long term (current) use of anticoagulants: Secondary | ICD-10-CM | POA: Diagnosis not present

## 2018-01-28 LAB — POCT INR: INR: 1.2 — AB (ref 2.0–3.0)

## 2018-02-04 ENCOUNTER — Ambulatory Visit (INDEPENDENT_AMBULATORY_CARE_PROVIDER_SITE_OTHER): Payer: Medicare HMO | Admitting: Family Medicine

## 2018-02-04 VITALS — BP 135/74 | HR 59

## 2018-02-04 DIAGNOSIS — I10 Essential (primary) hypertension: Secondary | ICD-10-CM

## 2018-02-04 NOTE — Progress Notes (Signed)
Pre visit review using our clinic tool,if applicable. No additional management support is needed unless otherwise documented below in the visit note.   Pt here for Blood pressure check per order from Dr. Frederik Pear, M.D.135/74  Pt currently takes: Cartia XT 120 mg, Carvedilol 12.5 mg bid, Hydralizine 20 mg bid.   Pt reports compliance with medication.  BP today @ = 135/74 HR = 59  Pt advised per Dr. Charlett Blake continue medications as ordered.

## 2018-02-15 ENCOUNTER — Ambulatory Visit (INDEPENDENT_AMBULATORY_CARE_PROVIDER_SITE_OTHER): Payer: Medicare HMO | Admitting: Family Medicine

## 2018-02-15 DIAGNOSIS — R002 Palpitations: Secondary | ICD-10-CM

## 2018-02-15 DIAGNOSIS — I10 Essential (primary) hypertension: Secondary | ICD-10-CM | POA: Diagnosis not present

## 2018-02-15 DIAGNOSIS — R51 Headache: Secondary | ICD-10-CM

## 2018-02-15 DIAGNOSIS — I480 Paroxysmal atrial fibrillation: Secondary | ICD-10-CM

## 2018-02-15 DIAGNOSIS — G8929 Other chronic pain: Secondary | ICD-10-CM

## 2018-02-15 DIAGNOSIS — N184 Chronic kidney disease, stage 4 (severe): Secondary | ICD-10-CM

## 2018-02-15 DIAGNOSIS — R519 Headache, unspecified: Secondary | ICD-10-CM

## 2018-02-15 MED ORDER — CARVEDILOL 12.5 MG PO TABS: 12.5000 mg | ORAL_TABLET | Freq: Three times a day (TID) | ORAL | 1 refills | Status: DC

## 2018-02-15 NOTE — Patient Instructions (Addendum)
Jobst compression hose, knee highs, light weight, 10-20 mmhg on in am and off in pm. Especially for prolonged sitting, traveling and walking  Check blood pressure a couple times weekly after resting 10-15 minutes and if consistently above 150 let us know   Shingrix is the new shingles shot 2 shots over two to six months. At pharmacy Flu and Prevnar and Pneumovax can all be gotten at [facility] or at the pharmacy Edema  Edema is an abnormal buildup of fluids in the body tissues and under the skin. Swelling of the legs, feet, and ankles is a common symptom that becomes more likely as you get older. Swelling is also common in looser tissues, like around the eyes. When the affected area is squeezed, the fluid may move out of that spot and leave a dent for a few moments. This dent is called pitting edema. There are many possible causes of edema. Eating too much salt (sodium) and being on your feet or sitting for a long time can cause edema in your legs, feet, and ankles. Hot weather may make edema worse. Common causes of edema include:  Heart failure.  Liver or kidney disease.  Weak leg blood vessels.  Cancer.  An injury.  Pregnancy.  Medicines.  Being obese.  Low protein levels in the blood. Edema is usually painless. Your skin may look swollen or shiny. Follow these instructions at home:  Keep the affected body part raised (elevated) above the level of your heart when you are sitting or lying down.  Do not sit still or stand for long periods of time.  Do not wear tight clothing. Do not wear garters on your upper legs.  Exercise your legs to get your circulation going. This helps to move the fluid back into your blood vessels, and it may help the swelling go down.  Wear elastic bandages or support stockings to reduce swelling as told by your health care provider.  Eat a low-salt (low-sodium) diet to reduce fluid as told by your health care provider.  Depending on the cause of  your swelling, you may need to limit how much fluid you drink (fluid restriction).  Take over-the-counter and prescription medicines only as told by your health care provider. Contact a health care provider if:  Your edema does not get better with treatment.  You have heart, liver, or kidney disease and have symptoms of edema.  You have sudden and unexplained weight gain. Get help right away if:  You develop shortness of breath or chest pain.  You cannot breathe when you lie down.  You develop pain, redness, or warmth in the swollen areas.  You have heart, liver, or kidney disease and suddenly get edema.  You have a fever and your symptoms suddenly get worse. Summary  Edema is an abnormal buildup of fluids in the body tissues and under the skin.  Eating too much salt (sodium) and being on your feet or sitting for a long time can cause edema in your legs, feet, and ankles.  Keep the affected body part raised (elevated) above the level of your heart when you are sitting or lying down. This information is not intended to replace advice given to you by your health care provider. Make sure you discuss any questions you have with your health care provider. Document Released: 01/19/2005 Document Revised: 02/22/2016 Document Reviewed: 02/22/2016 Elsevier Interactive Patient Education  2019 [employer].

## 2018-02-21 NOTE — Progress Notes (Signed)
Subjective:    Patient ID: Jillian Hayes, female    DOB: 01-06-1946, 73 y.o.   MRN: [id number]  No chief complaint on file.   HPI Patient is in today for follow up and is noting some continuing trouble with headaches. No associated symptoms and not worsening. Notes systolic blood pressures of 130s and 140s at home. No recent febrile illness or hospitalizations.  Past Medical History:  Diagnosis Date  . Advanced care planning/counseling discussion 06/10/2014   05/31/2014 patient presents copy of HCP and Living Will  . Anemia    iron deficiency  . Anxiety   . Atrial fibrillation ([place])   . Benign fundic gland polyps of stomach   . Bladder polyps 06/25/2010  . Chronic headaches 06/24/2010  . Chronic renal insufficiency, stage 4 (severe) ([place]) 11/09/2015  . Depression 1991   hospitalized  . History of chicken pox 06/25/2010  . Hypercalcemia 02/18/2014  . Hypertension   . Insomnia 06/24/2010  . Multiple chemical sensitivity syndrome 06/25/2010  . Proteinuria 02/18/2014  . Renal insufficiency 03/26/2011  . Valvular heart disease 04/28/2016    Past Surgical History:  Procedure Laterality Date  . cyst on left breast removed Left 1991   benign  . NASAL SEPTUM SURGERY  1986   rhinoplasty  . polyps on bladder removed  1972   benign  . TONSILLECTOMY  1962    Family History  Problem Relation Age of Onset  . Breast cancer Mother 51       left breast removed, 2010 lung  . Diabetes Mother   . Nephrolithiasis Father   . Heart attack Father 109       X 3  . Heart disease Father        smoker  . Hypertension Brother   . Allergic rhinitis Brother   . Melanoma Son 37       melanoma on leg removed  . Pancreatic cancer Maternal Grandmother   . Hypertension Paternal Grandmother   . Obesity Paternal Grandmother   . Heart attack Paternal Grandfather 32  . Diabetes Maternal Aunt        x 2  . Diabetes Maternal Uncle        x 3  . Asthma Neg Hx   . Eczema Neg Hx   . Urticaria Neg Hx     . Immunodeficiency Neg Hx   . Angioedema Neg Hx     Social History   Socioeconomic History  . Marital status: Married    Spouse name: Not on file  . Number of children: 2  . Years of education: Not on file  . Highest education level: Not on file  Occupational History  . Occupation: retired  Scientific laboratory technician  . Financial resource strain: Not on file  . Food insecurity:    Worry: Never true    Inability: Never true  . Transportation needs:    Medical: No    Non-medical: No  Tobacco Use  . Smoking status: Never Smoker  . Smokeless tobacco: Never Used  Substance and Sexual Activity  . Alcohol use: No  . Drug use: No  . Sexual activity: Yes    Partners: Male  Lifestyle  . Physical activity:    Days per week: Not on file    Minutes per session: Not on file  . Stress: Not on file  Relationships  . Social connections:    Talks on phone: Not on file    Gets together: Not on file  Attends religious service: Not on file    Active member of club or organization: Not on file    Attends meetings of clubs or organizations: Not on file    Relationship status: Not on file  . Intimate partner violence:    Fear of current or ex partner: No    Emotionally abused: No    Physically abused: No    Forced sexual activity: No  Other Topics Concern  . Not on file  Social History Narrative   Lives with husband.      Outpatient Medications Prior to Visit  Medication Sig Dispense Refill  . acetaminophen (TYLENOL) 500 MG tablet Take 500 mg by mouth every 6 (six) hours as needed for headache (pain).    . Alfalfa 250 MG TABS Take 1,500 mg by mouth 3 (three) times daily.    Marland Kitchen b complex vitamins capsule Take 1 capsule by mouth 2 (two) times daily with a meal.     . CARTIA XT 120 MG 24 hr capsule TAKE 1 CAPSULE BY MOUTH ONCE DAILY 90 capsule 1  . Cholecalciferol (VITAMIN D PO) Take by mouth daily.    . Flaxseed, Linseed, (FLAX SEED OIL PO) Take 1 capsule 2 (two) times daily by mouth.     .  hydrALAZINE (APRESOLINE) 10 MG tablet Take 2 tablets (20 mg total) by mouth 2 (two) times daily. 60 tablet 1  . IRON PO Take 15 mg by mouth daily. Shaklee product    . L-GLUTAMINE PO Take 1 capsule by mouth daily after supper.     . Probiotic Product (PROBIOTIC DAILY PO) Take 1 tablet by mouth daily with lunch.     . vitamin C (ASCORBIC ACID) 500 MG tablet Take 500 mg by mouth daily as needed (immune system boost).     Marland Kitchen VITAMIN E PO Take 1 capsule by mouth daily with lunch.    . warfarin (COUMADIN) 5 MG tablet TAKE 1 TO 1 & 1/2 (ONE TO ONE & ONE-HALF) TABLETS BY MOUTH ONCE DAILY AS DIRECTED 45 tablet 2  . carvedilol (COREG) 12.5 MG tablet Take 1 tablet (12.5 mg total) by mouth 2 (two) times daily with a meal. 60 tablet 3   No facility-administered medications prior to visit.     Allergies  Allergen Reactions  . Sulfa Antibiotics Nausea And Vomiting    Review of Systems  Constitutional: Positive for malaise/fatigue. Negative for fever.  HENT: Negative for congestion.   Eyes: Negative for blurred vision.  Respiratory: Negative for shortness of breath.   Cardiovascular: Negative for chest pain, palpitations and leg swelling.  Gastrointestinal: Negative for abdominal pain, blood in stool and nausea.  Genitourinary: Negative for dysuria and frequency.  Musculoskeletal: Negative for falls.  Skin: Negative for rash.  Neurological: Positive for headaches. Negative for dizziness and loss of consciousness.  Endo/Heme/Allergies: Negative for environmental allergies.  Psychiatric/Behavioral: Negative for depression. The patient is not nervous/anxious.        Objective:    Physical Exam Vitals signs and nursing note reviewed.  Constitutional:      General: She is not in acute distress.    Appearance: She is well-developed.  HENT:     Head: Normocephalic and atraumatic.     Nose: Nose normal.  Eyes:     General:        Right eye: No discharge.        Left eye: No discharge.  Neck:       Musculoskeletal: Normal range of motion  and neck supple.  Cardiovascular:     Rate and Rhythm: Normal rate.     Heart sounds: No murmur.  Pulmonary:     Effort: Pulmonary effort is normal.     Breath sounds: Normal breath sounds.  Abdominal:     General: Bowel sounds are normal.     Palpations: Abdomen is soft.     Tenderness: There is no abdominal tenderness.  Skin:    General: Skin is warm and dry.  Neurological:     Mental Status: She is alert and oriented to person, place, and time.     BP (!) 148/66   Pulse (!) 58   Temp 97.8 F (36.6 C) (Oral)   Resp 18   Wt 148 lb 6.4 oz (67.3 kg)   SpO2 97%   BMI 23.95 kg/m  Wt Readings from Last 3 Encounters:  02/15/18 148 lb 6.4 oz (67.3 kg)  12/21/17 141 lb (64 kg)  12/20/17 142 lb 6.4 oz (64.6 kg)     Lab Results  Component Value Date   WBC 5.4 12/21/2017   HGB 10.9 (L) 12/21/2017   HCT 33.4 (L) 12/21/2017   PLT 245.0 12/21/2017   GLUCOSE 90 12/21/2017   CHOL 192 10/26/2017   TRIG 43 10/26/2017   HDL 79 10/26/2017   LDLCALC 104 (H) 10/26/2017   ALT 21 12/21/2017   AST 27 12/21/2017   NA 138 12/21/2017   K 4.5 12/21/2017   CL 104 12/21/2017   CREATININE 2.34 (H) 12/21/2017   BUN 49 (H) 12/21/2017   CO2 27 12/21/2017   TSH 1.922 10/25/2017   INR 1.2 (A) 01/28/2018   HGBA1C 5.7 (H) 10/25/2017    Lab Results  Component Value Date   TSH 1.922 10/25/2017   Lab Results  Component Value Date   WBC 5.4 12/21/2017   HGB 10.9 (L) 12/21/2017   HCT 33.4 (L) 12/21/2017   MCV 82.9 12/21/2017   PLT 245.0 12/21/2017   Lab Results  Component Value Date   NA 138 12/21/2017   K 4.5 12/21/2017   CHLORIDE 109 03/25/2016   CO2 27 12/21/2017   GLUCOSE 90 12/21/2017   BUN 49 (H) 12/21/2017   CREATININE 2.34 (H) 12/21/2017   BILITOT 0.4 12/21/2017   ALKPHOS 67 12/21/2017   AST 27 12/21/2017   ALT 21 12/21/2017   PROT 5.9 (L) 12/21/2017   ALBUMIN 3.4 (L) 12/21/2017   CALCIUM 9.7 12/21/2017   ANIONGAP 7  10/27/2017   EGFR 20 (L) 03/25/2016   GFR 21.71 (L) 12/21/2017   Lab Results  Component Value Date   CHOL 192 10/26/2017   Lab Results  Component Value Date   HDL 79 10/26/2017   Lab Results  Component Value Date   LDLCALC 104 (H) 10/26/2017   Lab Results  Component Value Date   TRIG 43 10/26/2017   Lab Results  Component Value Date   CHOLHDL 2.4 10/26/2017   Lab Results  Component Value Date   HGBA1C 5.7 (H) 10/25/2017       Assessment & Plan:   Problem List Items Addressed This Visit    Chronic headaches    Encouraged increased hydration, 64 ounces of clear fluids daily. Minimize alcohol and caffeine. Eat small frequent meals with lean proteins and complex carbs. Avoid high and low blood sugars. Get adequate sleep, 7-8 hours a night. Needs exercise daily preferably in the morning.      Relevant Medications   carvedilol (COREG) 12.5 MG tablet  HTN (hypertension)    Improved on recheck and she reports much better numbers at home systolics generally in the 130s and 140s. She will monitor and let us know if numbers trend up. Will increase Carvedilol to tid at same strength tab. . Encouraged heart healthy diet such as the DASH diet and exercise as tolerated.       Relevant Medications   carvedilol (COREG) 12.5 MG tablet   Chronic renal insufficiency, stage 4 (severe) (HCC)    Monitor and hydrate      Palpitation    No recent episodes noted      Atrial fibrillation ([facility])    Rate controlled, tolerating coumadin      Relevant Medications   carvedilol (COREG) 12.5 MG tablet      I have changed Eliane S. Garver's carvedilol. I am also having her maintain her vitamin C, (Flaxseed, Linseed, (FLAX SEED OIL PO)), b complex vitamins, L-GLUTAMINE PO, Probiotic Product (PROBIOTIC DAILY PO), Alfalfa, IRON PO, VITAMIN E PO, acetaminophen, Cholecalciferol (VITAMIN D PO), CARTIA XT, warfarin, and hydrALAZINE.  Meds ordered this encounter  Medications  . carvedilol  (COREG) 12.5 MG tablet    Sig: Take 1 tablet (12.5 mg total) by mouth 3 (three) times daily.    Dispense:  270 tablet    Refill:  1     [name], MD

## 2018-02-21 NOTE — Assessment & Plan Note (Signed)
Encouraged increased hydration, 64 ounces of clear fluids daily. Minimize alcohol and caffeine. Eat small frequent meals with lean proteins and complex carbs. Avoid high and low blood sugars. Get adequate sleep, 7-8 hours a night. Needs exercise daily preferably in the morning.  

## 2018-02-21 NOTE — Assessment & Plan Note (Signed)
Monitor and hydrate

## 2018-02-21 NOTE — Assessment & Plan Note (Signed)
No recent episodes noted 

## 2018-02-21 NOTE — Assessment & Plan Note (Signed)
Rate controlled, tolerating coumadin 

## 2018-02-21 NOTE — Assessment & Plan Note (Addendum)
Improved on recheck and she reports much better numbers at home systolics generally in the 130s and 140s. She will monitor and let us know if numbers trend up. Will increase Carvedilol to tid at same strength tab. . Encouraged heart healthy diet such as the DASH diet and exercise as tolerated.

## 2018-02-24 ENCOUNTER — Ambulatory Visit (INDEPENDENT_AMBULATORY_CARE_PROVIDER_SITE_OTHER): Payer: Medicare HMO

## 2018-02-24 DIAGNOSIS — Z23 Encounter for immunization: Secondary | ICD-10-CM | POA: Diagnosis not present

## 2018-02-24 NOTE — Progress Notes (Signed)
Patient here for flu shot today, injection administered w/o any incidents. Patient left the office in satisfactory condition.

## 2018-03-02 ENCOUNTER — Other Ambulatory Visit: Payer: Self-pay | Admitting: Family Medicine

## 2018-03-02 NOTE — Progress Notes (Signed)
Cardiology Office Note   Date:  03/03/2018   ID:  Jillian Hayes, DOB Mar 02, 1945, MRN [id number]  PCP:  [name], MD  Cardiologist:   Minus Breeding, MD  Referring:  [name], MD  Chief Complaint  Patient presents with  . Atrial Fibrillation      History of Present Illness: Jillian Hayes is a 72 y.o. female who presents for follow up of HTN.   She had an abnormal EKG and borderline POET (Plain Old Exercise Treadmill).  I sent her for a perfusion study which was negative for ischemia.  She was admitted 9/23-9/25/2019 for atrial fibrillation, RVR.  She spontaneously converted to sinus rhythm and was started on Cardizem and Coumadin.    Since she was last seen she is done well.  She has some rare palpitations but none of the symptoms are consistent with her previous fibrillation. The patient denies any new symptoms such as chest discomfort, neck or arm discomfort. There has been no new shortness of breath, PND or orthopnea. There have been no reported palpitations, presyncope or syncope.    Past Medical History:  Diagnosis Date  . Advanced care planning/counseling discussion 06/10/2014   05/31/2014 patient presents copy of HCP and Living Will  . Anemia    iron deficiency  . Anxiety   . Atrial fibrillation ([place])   . Benign fundic gland polyps of stomach   . Bladder polyps 06/25/2010  . Chronic headaches 06/24/2010  . Chronic renal insufficiency, stage 4 (severe) ([place]) 11/09/2015  . Depression 1991   hospitalized  . History of chicken pox 06/25/2010  . Hypercalcemia 02/18/2014  . Hypertension   . Insomnia 06/24/2010  . Multiple chemical sensitivity syndrome 06/25/2010  . Proteinuria 02/18/2014  . Renal insufficiency 03/26/2011  . Valvular heart disease 04/28/2016    Past Surgical History:  Procedure Laterality Date  . cyst on left breast removed Left 1991   benign  . NASAL SEPTUM SURGERY  1986   rhinoplasty  . polyps on bladder removed  1972   benign  .  TONSILLECTOMY  1962     Current Outpatient Medications  Medication Sig Dispense Refill  . acetaminophen (TYLENOL) 500 MG tablet Take 500 mg by mouth every 6 (six) hours as needed for headache (pain).    . Alfalfa 250 MG TABS Take 1,500 mg by mouth 3 (three) times daily.    Marland Kitchen b complex vitamins capsule Take 1 capsule by mouth 2 (two) times daily with a meal.     . CARTIA XT 120 MG 24 hr capsule TAKE 1 CAPSULE BY MOUTH ONCE DAILY 90 capsule 1  . carvedilol (COREG) 12.5 MG tablet Take 1 tablet (12.5 mg total) by mouth 3 (three) times daily. 270 tablet 1  . Cholecalciferol (VITAMIN D PO) Take by mouth daily.    . Flaxseed, Linseed, (FLAX SEED OIL PO) Take 1 capsule 2 (two) times daily by mouth.     . IRON PO Take 15 mg by mouth daily. Shaklee product    . L-GLUTAMINE PO Take 1 capsule by mouth daily after supper.     . Probiotic Product (PROBIOTIC DAILY PO) Take 1 tablet by mouth daily with lunch.     . vitamin C (ASCORBIC ACID) 500 MG tablet Take 500 mg by mouth daily as needed (immune system boost).     Marland Kitchen VITAMIN E PO Take 1 capsule by mouth daily with lunch.    . warfarin (COUMADIN) 5 MG tablet TAKE  1 TO 1 & 1/2 (ONE TO ONE & ONE-HALF) TABLETS BY MOUTH ONCE DAILY AS DIRECTED 45 tablet 2  . hydrALAZINE (APRESOLINE) 10 MG tablet TAKE 2 TABLETS BY MOUTH TWICE DAILY 60 tablet 0   No current facility-administered medications for this visit.     Allergies:   Sulfa antibiotics    ROS:  Please see the history of present illness.   Otherwise, review of systems are positive for none.   All other systems are reviewed and negative.    PHYSICAL EXAM: VS:  BP (!) 157/55   Pulse (!) 59   Ht 5\' 6"  (1.676 m)   Wt 144 lb 12.8 oz (65.7 kg)   BMI 23.37 kg/m  , BMI Body mass index is 23.37 kg/m. GENERAL:  Well appearing NECK:  No jugular venous distention, waveform within normal limits, carotid upstroke brisk and symmetric, no bruits, no thyromegaly LUNGS:  Clear to auscultation bilaterally CHEST:   Unremarkable HEART:  PMI not displaced or sustained,S1 and S2 within normal limits, no S3, no S4, no clicks, no rubs, soft apical systolic murmur, 2 out of 6 diastolic murmur heard best at the third left intercostal space ABD:  Flat, positive bowel sounds normal in frequency in pitch, no bruits, no rebound, no guarding, no midline pulsatile mass, no hepatomegaly, no splenomegaly EXT:  2 plus pulses throughout, mild bilateral leg edema, no cyanosis no clubbing     EKG:  EKG is not ordered today.    Recent Labs: 10/25/2017: TSH 1.922 10/27/2017: Magnesium 2.0 12/21/2017: ALT 21; BUN 49; Creatinine, Ser 2.34; Hemoglobin 10.9; Platelets 245.0; Potassium 4.5; Sodium 138    Lipid Panel    Component Value Date/Time   CHOL 192 10/26/2017 0325   TRIG 43 10/26/2017 0325   HDL 79 10/26/2017 0325   CHOLHDL 2.4 10/26/2017 0325   VLDL 9 10/26/2017 0325   LDLCALC 104 (H) 10/26/2017 0325      Wt Readings from Last 3 Encounters:  03/03/18 144 lb 12.8 oz (65.7 kg)  02/15/18 148 lb 6.4 oz (67.3 kg)  12/21/17 141 lb (64 kg)      Other studies Reviewed: Additional studies/ records that were reviewed today include: None Review of the above records demonstrates:     ASSESSMENT AND PLAN:  ATRIAL FIB:   Jillian Hayes has a CHA2DS2 - VASc score of 3.  She has had no symptomatic recurrence.  She tolerates anticoagulation.  No change in therapy.Marland Kitchen    HTN:  The blood pressure is slightly elevated but she will keep a blood pressure diary.  I be happy to review this and make suggestions for change if this is a consistent finding.    MILD/MODERATE AI:   I will follow this clinically and with an echocardiogram in 1 year.  BRUIT:   She had mild bilateral plaque last year on Doppler.  No change in therapy.  No follow-up imaging at this time.  EDEMA: She has some mild lower extremity swelling.  I have suggested compression stockings.  She was given a flyer on where to get these.  We also talked  about the association with salt and being on her feet.  I do not think further testing or therapy is indicated other than conservative.  Current medicines are reviewed at length with the patient today.  The patient does not have concerns regarding medicines.  The following changes have been made:  None  Labs/ tests ordered today include:  None  Orders Placed This  Encounter  Procedures  . ECHOCARDIOGRAM COMPLETE     Disposition:   FU with me in one year.     Signed, Minus Breeding, MD  03/03/2018 12:45 PM    [place]

## 2018-03-03 ENCOUNTER — Ambulatory Visit: Payer: Medicare HMO | Admitting: Cardiology

## 2018-03-03 ENCOUNTER — Encounter: Payer: Self-pay | Admitting: Cardiology

## 2018-03-03 ENCOUNTER — Ambulatory Visit (INDEPENDENT_AMBULATORY_CARE_PROVIDER_SITE_OTHER): Payer: Medicare HMO | Admitting: Pharmacist

## 2018-03-03 VITALS — BP 157/55 | HR 59 | Ht 66.0 in | Wt 144.8 lb

## 2018-03-03 DIAGNOSIS — M7989 Other specified soft tissue disorders: Secondary | ICD-10-CM

## 2018-03-03 DIAGNOSIS — I480 Paroxysmal atrial fibrillation: Secondary | ICD-10-CM | POA: Diagnosis not present

## 2018-03-03 DIAGNOSIS — I351 Nonrheumatic aortic (valve) insufficiency: Secondary | ICD-10-CM

## 2018-03-03 DIAGNOSIS — I10 Essential (primary) hypertension: Secondary | ICD-10-CM | POA: Diagnosis not present

## 2018-03-03 DIAGNOSIS — Z7901 Long term (current) use of anticoagulants: Secondary | ICD-10-CM | POA: Diagnosis not present

## 2018-03-03 DIAGNOSIS — I4891 Unspecified atrial fibrillation: Secondary | ICD-10-CM | POA: Diagnosis not present

## 2018-03-03 LAB — POCT INR: INR: 1.2 — AB (ref 2.0–3.0)

## 2018-03-03 NOTE — Patient Instructions (Addendum)
Medication Instructions:  Continue current medications  If you need a refill on your cardiac medications before your next appointment, please call your pharmacy.  Labwork: None Ordered   Take the provided lab slips with you to the lab for your blood draw.   When you have your labs (blood work) drawn today and your tests are completely normal, you will receive your results only by MyChart Message (if you have MyChart) -OR-  A paper copy in the mail.  If you have any lab test that is abnormal or we need to change your treatment, we will call you to review these results.  Testing/Procedures: Your physician has requested that you have an echocardiogram in 1 Year. Echocardiography is a painless test that uses sound waves to create images of your heart. It provides your doctor with information about the size and shape of your heart and how well your heart's chambers and valves are working. This procedure takes approximately one hour. There are no restrictions for this procedure.  Follow-Up: You will need a follow up appointment in 1 Year.  Please call our office 2 months in advance to schedule this appointment.  You may see Minus Breeding, MD or one of the following Advanced Practice Providers on your designated Care Team:   Rosaria Ferries, PA-C . Jory Sims, DNP, ANP   At [facility], you and your health needs are our priority.  As part of our continuing mission to provide you with exceptional heart care, we have created designated Provider Care Teams.  These Care Teams include your primary Cardiologist (physician) and Advanced Practice Providers (APPs -  Physician Assistants and Nurse Practitioners) who all work together to provide you with the care you need, when you need it.  Thank you for choosing [facility] at [facility]!!

## 2018-03-07 DIAGNOSIS — N184 Chronic kidney disease, stage 4 (severe): Secondary | ICD-10-CM | POA: Diagnosis not present

## 2018-03-07 DIAGNOSIS — E213 Hyperparathyroidism, unspecified: Secondary | ICD-10-CM | POA: Diagnosis not present

## 2018-03-07 IMAGING — MR MR HEAD W/O CM
9 of 10 series · 42 of 48 positions shown · non-contrast
Comparison: Prior CT from 07/19/2016.

CLINICAL DATA: Initial evaluation for sclerotic headaches,
dizziness.

EXAM:
MRI HEAD WITHOUT CONTRAST
TECHNIQUE: Multiplanar, multiecho pulse sequences of the brain and surrounding
structures were obtained without intravenous contrast.

[Series 2: T1 · sagittal · 5.0mm · 0.45mm/px · 2 of 25 slices shown]
[im 1/25]
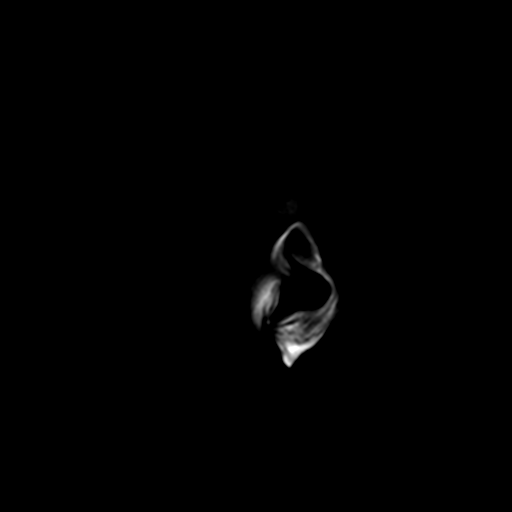
[im 25/25]
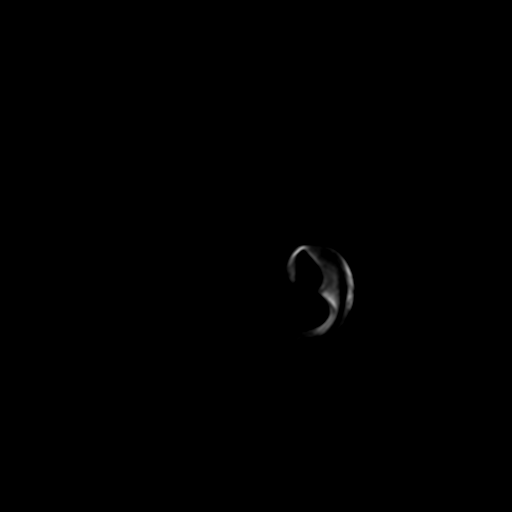

[Series 3: DWI · axial · 3.0mm · 2.19mm/px · z∈[-41,+114]mm · 10 of 95 slices shown (1 of 4)]
[im 1/95]
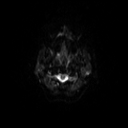
[im 11/95]
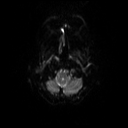
[im 21/95]
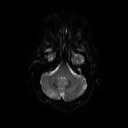
[im 32/95]
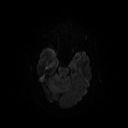
[im 42/95]
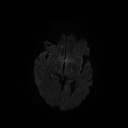
[im 53/95]
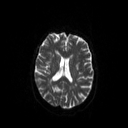
[im 63/95]
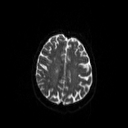
[im 74/95]
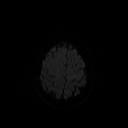
[im 84/95]
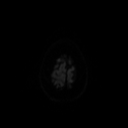
[im 95/95]
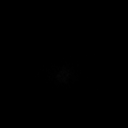

[Series 4: DWI · axial · 3.0mm · 2.19mm/px · z∈[-41,+114]mm · 5 of 48 slices shown (2 of 4)]
[im 1/48]
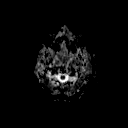
[im 12/48]
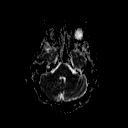
[im 24/48]
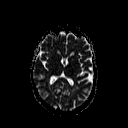
[im 36/48]
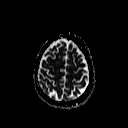
[im 48/48]
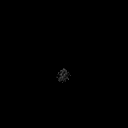

[Series 5: T2 · axial · 5.0mm · 0.45mm/px · z∈[-36,+125]mm · 2 of 24 slices shown (1 of 3)]
[im 1/24]
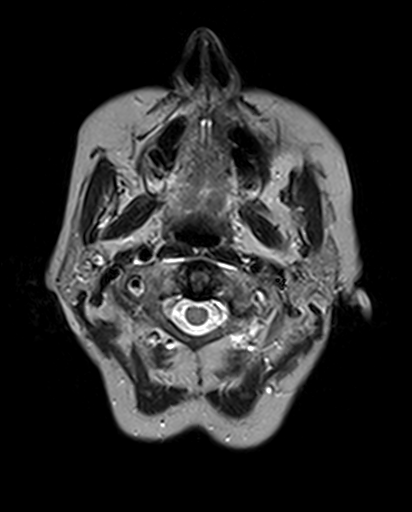
[im 24/24]
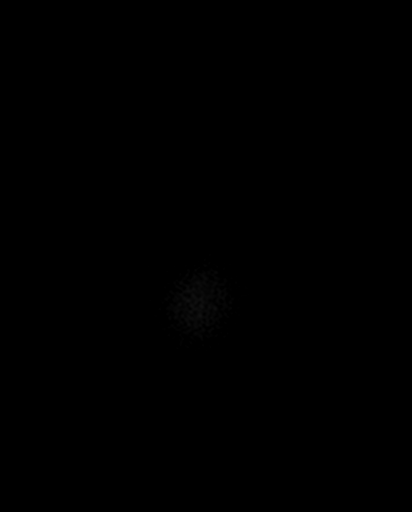

[Series 6: T2 · axial · 5.0mm · 0.45mm/px · z∈[-36,+125]mm · 2 of 24 slices shown (2 of 3)]
[im 1/24]
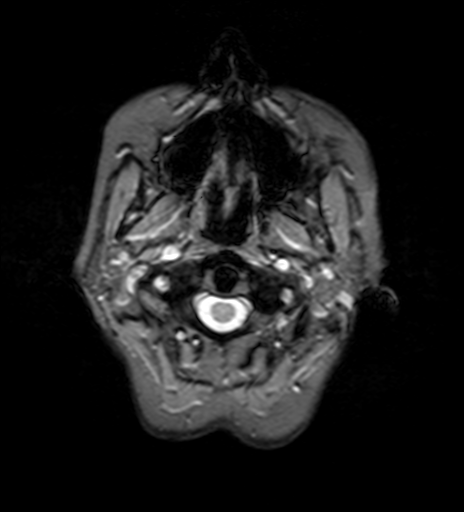
[im 24/24]
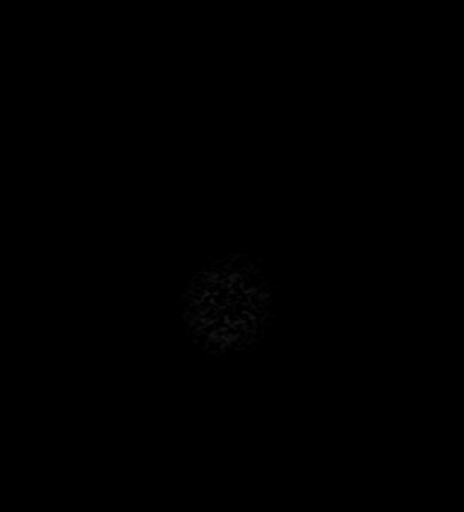

[Series 7: FLAIR · axial · 3.0mm · 0.45mm/px · z∈[-34,+122]mm · 3 of 27 slices shown]
[im 1/27]
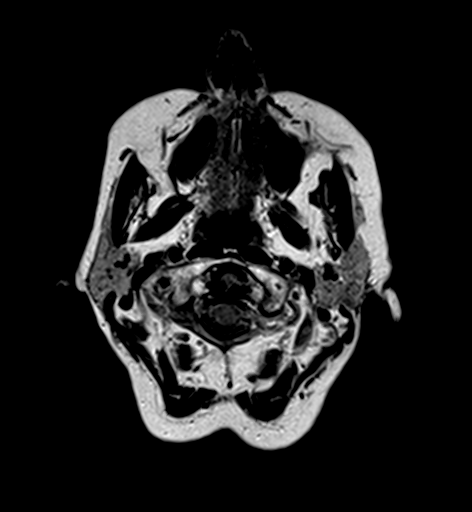
[im 14/27]
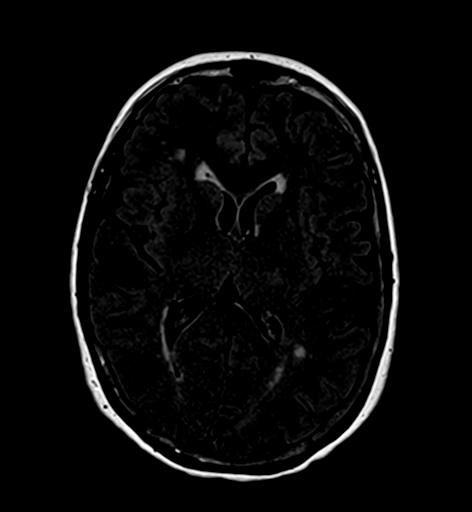
[im 27/27]
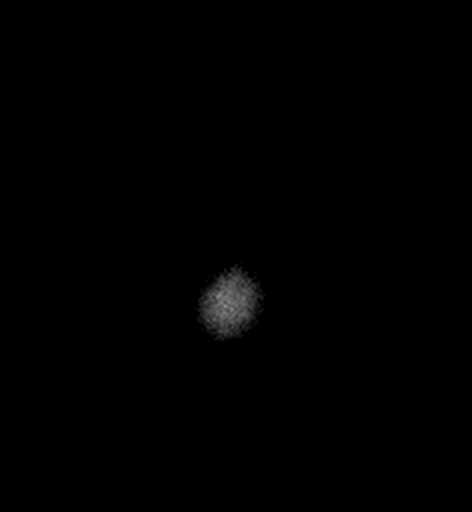

[Series 9: T2 · coronal · 5.0mm · 0.45mm/px · 3 of 29 slices shown (3 of 3)]
[im 1/29]
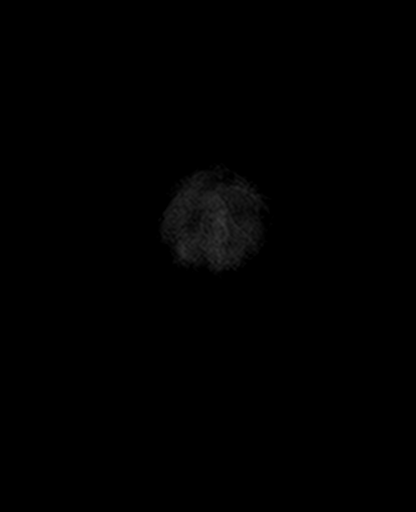
[im 15/29]
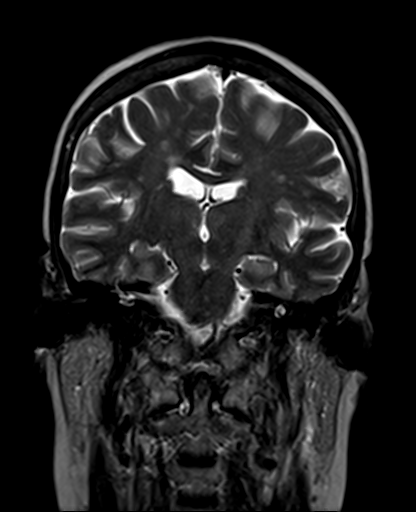
[im 29/29]
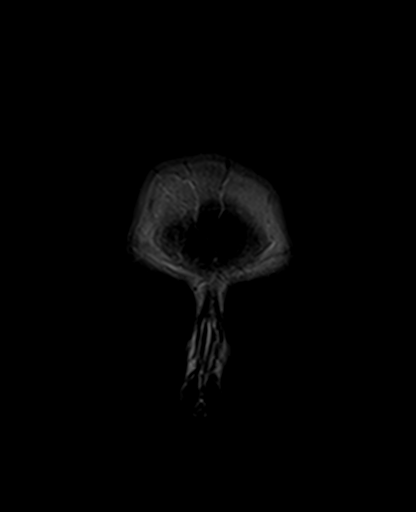

[Series 10: DWI · coronal · 3.0mm · 1.46mm/px · 10 of 96 slices shown (3 of 4)]
[im 1/96]
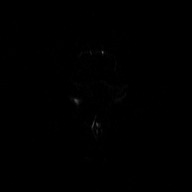
[im 11/96]
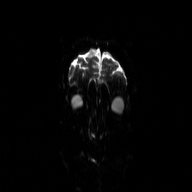
[im 22/96]
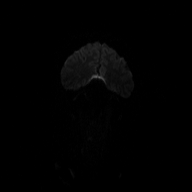
[im 32/96]
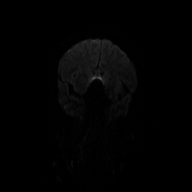
[im 43/96]
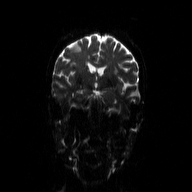
[im 53/96]
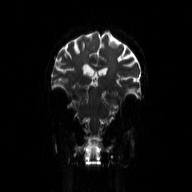
[im 64/96]
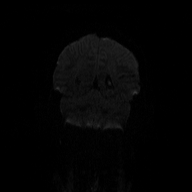
[im 74/96]
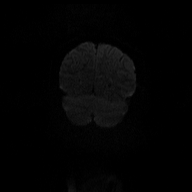
[im 85/96]
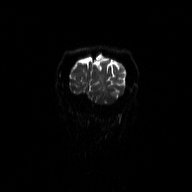
[im 96/96]
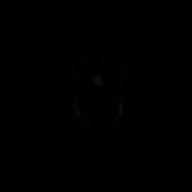

[Series 11: DWI · coronal · 3.0mm · 1.46mm/px · 5 of 48 slices shown (4 of 4)]
[im 1/48]
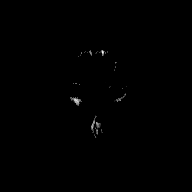
[im 12/48]
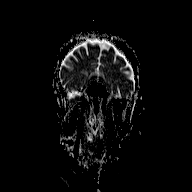
[im 24/48]
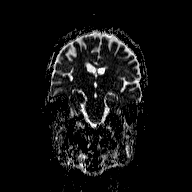
[im 36/48]
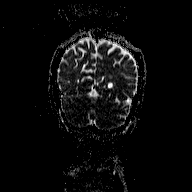
[im 48/48]
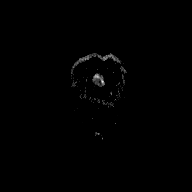

[42 of 48 positions shown; findings below may reference images not displayed]

FINDINGS: Brain: Cerebral volume within normal limits for age. Patchy T2/FLAIR
hyperintensity within the periventricular and deep white matter both
cerebral hemispheres, nonspecific, but most like related to chronic
small vessel ischemic disease, mild to moderate nature.

No abnormal foci of restricted diffusion to suggest acute or
subacute ischemia. Gray-white matter differentiation maintained. No
areas of remote encephalomalacia to suggest prior or chronic
infarction. No evidence for acute or chronic intracranial
hemorrhage.

No mass lesion, midline shift or mass effect. Ventricles normal size
without evidence for hydrocephalus. No extra-axial fluid collection.
Major dural sinuses are grossly patent.

Pituitary gland suprasellar region within normal limits.

Vascular: Major intracranial vascular flow voids are maintained.

Skull and upper cervical spine: Craniocervical junction normal.
Visualized upper cervical spine unremarkable. Bone marrow signal
intensity within normal limits. No scalp soft tissue abnormality.

Sinuses/Orbits: Globes and orbital soft tissues within normal
limits. Paranasal sinuses are clear. No mastoid effusion. Inner ear
structures normal.
IMPRESSION: 1. No acute or reversible intracranial process identified.
2. Mild patchy cerebral white matter changes, nonspecific, but most
likely related to chronic small vessel ischemic disease, mild to
moderate nature.

## 2018-03-09 DIAGNOSIS — E213 Hyperparathyroidism, unspecified: Secondary | ICD-10-CM | POA: Diagnosis not present

## 2018-03-09 DIAGNOSIS — D631 Anemia in chronic kidney disease: Secondary | ICD-10-CM | POA: Diagnosis not present

## 2018-03-09 DIAGNOSIS — I129 Hypertensive chronic kidney disease with stage 1 through stage 4 chronic kidney disease, or unspecified chronic kidney disease: Secondary | ICD-10-CM | POA: Diagnosis not present

## 2018-03-09 DIAGNOSIS — N184 Chronic kidney disease, stage 4 (severe): Secondary | ICD-10-CM | POA: Diagnosis not present

## 2018-03-11 ENCOUNTER — Ambulatory Visit (INDEPENDENT_AMBULATORY_CARE_PROVIDER_SITE_OTHER): Payer: Medicare HMO | Admitting: Pharmacist Clinician (PhC)/ Clinical Pharmacy Specialist

## 2018-03-11 DIAGNOSIS — I4891 Unspecified atrial fibrillation: Secondary | ICD-10-CM | POA: Diagnosis not present

## 2018-03-11 DIAGNOSIS — Z7901 Long term (current) use of anticoagulants: Secondary | ICD-10-CM | POA: Diagnosis not present

## 2018-03-11 LAB — POCT INR: INR: 2.2 (ref 2.0–3.0)

## 2018-03-11 NOTE — Patient Instructions (Signed)
Take 1 tablet daily except for 1.5 tablets every Monday and Friday. Repeat INR in 2 weeks.

## 2018-03-29 ENCOUNTER — Ambulatory Visit (INDEPENDENT_AMBULATORY_CARE_PROVIDER_SITE_OTHER): Payer: Medicare HMO | Admitting: Pharmacist Clinician (PhC)/ Clinical Pharmacy Specialist

## 2018-03-29 DIAGNOSIS — Z7901 Long term (current) use of anticoagulants: Secondary | ICD-10-CM | POA: Diagnosis not present

## 2018-03-29 DIAGNOSIS — I4891 Unspecified atrial fibrillation: Secondary | ICD-10-CM | POA: Diagnosis not present

## 2018-03-29 LAB — POCT INR: INR: 2.1 (ref 2.0–3.0)

## 2018-03-29 NOTE — Patient Instructions (Signed)
Take 1 tablet daily except for 1.5 tablets every Monday and Friday. Repeat INR in 4 weeks.

## 2018-04-06 DIAGNOSIS — H52221 Regular astigmatism, right eye: Secondary | ICD-10-CM | POA: Diagnosis not present

## 2018-04-06 DIAGNOSIS — H524 Presbyopia: Secondary | ICD-10-CM | POA: Diagnosis not present

## 2018-04-06 DIAGNOSIS — H5203 Hypermetropia, bilateral: Secondary | ICD-10-CM | POA: Diagnosis not present

## 2018-04-18 DIAGNOSIS — N184 Chronic kidney disease, stage 4 (severe): Secondary | ICD-10-CM | POA: Diagnosis not present

## 2018-04-18 DIAGNOSIS — E213 Hyperparathyroidism, unspecified: Secondary | ICD-10-CM | POA: Diagnosis not present

## 2018-04-18 DIAGNOSIS — D631 Anemia in chronic kidney disease: Secondary | ICD-10-CM | POA: Diagnosis not present

## 2018-04-20 DIAGNOSIS — R69 Illness, unspecified: Secondary | ICD-10-CM | POA: Diagnosis not present

## 2018-04-20 DIAGNOSIS — E213 Hyperparathyroidism, unspecified: Secondary | ICD-10-CM | POA: Diagnosis not present

## 2018-04-20 DIAGNOSIS — N184 Chronic kidney disease, stage 4 (severe): Secondary | ICD-10-CM | POA: Diagnosis not present

## 2018-04-20 DIAGNOSIS — I129 Hypertensive chronic kidney disease with stage 1 through stage 4 chronic kidney disease, or unspecified chronic kidney disease: Secondary | ICD-10-CM | POA: Diagnosis not present

## 2018-04-20 DIAGNOSIS — D631 Anemia in chronic kidney disease: Secondary | ICD-10-CM | POA: Diagnosis not present

## 2018-04-20 DIAGNOSIS — I10 Essential (primary) hypertension: Secondary | ICD-10-CM | POA: Insufficient documentation

## 2018-04-25 ENCOUNTER — Telehealth: Payer: Self-pay

## 2018-04-25 NOTE — Telephone Encounter (Signed)

## 2018-04-26 ENCOUNTER — Ambulatory Visit (INDEPENDENT_AMBULATORY_CARE_PROVIDER_SITE_OTHER): Payer: Medicare HMO | Admitting: *Deleted

## 2018-04-26 ENCOUNTER — Other Ambulatory Visit: Payer: Self-pay

## 2018-04-26 DIAGNOSIS — Z7901 Long term (current) use of anticoagulants: Secondary | ICD-10-CM | POA: Diagnosis not present

## 2018-04-26 DIAGNOSIS — I4891 Unspecified atrial fibrillation: Secondary | ICD-10-CM

## 2018-04-26 LAB — POCT INR: INR: 2 (ref 2.0–3.0)

## 2018-05-12 ENCOUNTER — Other Ambulatory Visit: Payer: Self-pay | Admitting: Cardiology

## 2018-05-14 DIAGNOSIS — N184 Chronic kidney disease, stage 4 (severe): Secondary | ICD-10-CM | POA: Diagnosis not present

## 2018-05-14 DIAGNOSIS — D631 Anemia in chronic kidney disease: Secondary | ICD-10-CM | POA: Diagnosis not present

## 2018-05-14 DIAGNOSIS — I129 Hypertensive chronic kidney disease with stage 1 through stage 4 chronic kidney disease, or unspecified chronic kidney disease: Secondary | ICD-10-CM | POA: Diagnosis not present

## 2018-05-23 ENCOUNTER — Telehealth: Payer: Self-pay

## 2018-05-23 NOTE — Telephone Encounter (Signed)

## 2018-05-24 ENCOUNTER — Other Ambulatory Visit: Payer: Self-pay

## 2018-05-24 ENCOUNTER — Ambulatory Visit (INDEPENDENT_AMBULATORY_CARE_PROVIDER_SITE_OTHER): Payer: Medicare HMO | Admitting: *Deleted

## 2018-05-24 DIAGNOSIS — E213 Hyperparathyroidism, unspecified: Secondary | ICD-10-CM | POA: Diagnosis not present

## 2018-05-24 DIAGNOSIS — N184 Chronic kidney disease, stage 4 (severe): Secondary | ICD-10-CM | POA: Diagnosis not present

## 2018-05-24 DIAGNOSIS — Z7901 Long term (current) use of anticoagulants: Secondary | ICD-10-CM | POA: Diagnosis not present

## 2018-05-24 DIAGNOSIS — I129 Hypertensive chronic kidney disease with stage 1 through stage 4 chronic kidney disease, or unspecified chronic kidney disease: Secondary | ICD-10-CM | POA: Diagnosis not present

## 2018-05-24 DIAGNOSIS — I4891 Unspecified atrial fibrillation: Secondary | ICD-10-CM

## 2018-05-24 DIAGNOSIS — D631 Anemia in chronic kidney disease: Secondary | ICD-10-CM | POA: Diagnosis not present

## 2018-05-24 LAB — POCT INR: INR: 2 (ref 2.0–3.0)

## 2018-06-16 ENCOUNTER — Ambulatory Visit (INDEPENDENT_AMBULATORY_CARE_PROVIDER_SITE_OTHER): Payer: Medicare HMO | Admitting: Family Medicine

## 2018-06-16 ENCOUNTER — Other Ambulatory Visit: Payer: Self-pay

## 2018-06-16 DIAGNOSIS — E782 Mixed hyperlipidemia: Secondary | ICD-10-CM | POA: Diagnosis not present

## 2018-06-16 DIAGNOSIS — I480 Paroxysmal atrial fibrillation: Secondary | ICD-10-CM

## 2018-06-16 DIAGNOSIS — N184 Chronic kidney disease, stage 4 (severe): Secondary | ICD-10-CM | POA: Diagnosis not present

## 2018-06-16 DIAGNOSIS — D631 Anemia in chronic kidney disease: Secondary | ICD-10-CM | POA: Diagnosis not present

## 2018-06-16 DIAGNOSIS — I10 Essential (primary) hypertension: Secondary | ICD-10-CM

## 2018-06-16 DIAGNOSIS — T733XXS Exhaustion due to excessive exertion, sequela: Secondary | ICD-10-CM

## 2018-06-19 NOTE — Assessment & Plan Note (Signed)
This is her greatest frustration. Likely multifactorial. Hydrate well. Maintain a heart healthy diet and get plenty of sleep. Check labs

## 2018-06-19 NOTE — Assessment & Plan Note (Signed)
Hydrate and monitor, avoid OTC meds.

## 2018-06-19 NOTE — Progress Notes (Signed)
Virtual Visit via Video Note  I connected with Jillian Hayes on 06/16/2018 at  3:40 PM EDT by a video enabled telemedicine application and verified that I am speaking with the correct person using two identifiers.  Location: Patient: home Provider: office   I discussed the limitations of evaluation and management by telemedicine and the availability of in person appointments. The patient expressed understanding and agreed to proceed. Magdalene Molly, CMA was able to get the patient set up on a video visit.     Subjective:    Patient ID: Jillian Hayes, female    DOB: 1946/01/29, 73 y.o.   MRN: [id number]  No chief complaint on file.   HPI Patient is in today for follow up on chronic medical concerns including hypertension, atrial fibrillation, anemia and more. She is frustrated with her ongoing struggle with fatigue. Is slowly improving. She has had cardiology increase her Hydralazine 25 g tabs to 2 tabs po bid. Denies CP/palp/SOB/HA/congestion/fevers/GI or GU c/o. Taking meds as prescribed  Past Medical History:  Diagnosis Date  . Advanced care planning/counseling discussion 06/10/2014   05/31/2014 patient presents copy of HCP and Living Will  . Anemia    iron deficiency  . Anxiety   . Atrial fibrillation ([place])   . Benign fundic gland polyps of stomach   . Bladder polyps 06/25/2010  . Chronic headaches 06/24/2010  . Chronic renal insufficiency, stage 4 (severe) ([place]) 11/09/2015  . Depression 1991   hospitalized  . History of chicken pox 06/25/2010  . Hypercalcemia 02/18/2014  . Hypertension   . Insomnia 06/24/2010  . Multiple chemical sensitivity syndrome 06/25/2010  . Proteinuria 02/18/2014  . Renal insufficiency 03/26/2011  . Valvular heart disease 04/28/2016    Past Surgical History:  Procedure Laterality Date  . cyst on left breast removed Left 1991   benign  . NASAL SEPTUM SURGERY  1986   rhinoplasty  . polyps on bladder removed  1972   benign  . TONSILLECTOMY  1962    Family History  Problem Relation Age of Onset  . Breast cancer Mother 30       left breast removed, 2010 lung  . Diabetes Mother   . Nephrolithiasis Father   . Heart attack Father 68       X 3  . Heart disease Father        smoker  . Hypertension Brother   . Allergic rhinitis Brother   . Melanoma Son 37       melanoma on leg removed  . Pancreatic cancer Maternal Grandmother   . Hypertension Paternal Grandmother   . Obesity Paternal Grandmother   . Heart attack Paternal Grandfather 92  . Diabetes Maternal Aunt        x 2  . Diabetes Maternal Uncle        x 3  . Asthma Neg Hx   . Eczema Neg Hx   . Urticaria Neg Hx   . Immunodeficiency Neg Hx   . Angioedema Neg Hx     Social History   Socioeconomic History  . Marital status: Married    Spouse name: Not on file  . Number of children: 2  . Years of education: Not on file  . Highest education level: Not on file  Occupational History  . Occupation: retired  Scientific laboratory technician  . Financial resource strain: Not on file  . Food insecurity:    Worry: Never true    Inability: Never true  . Transportation needs:  Medical: No    Non-medical: No  Tobacco Use  . Smoking status: Never Smoker  . Smokeless tobacco: Never Used  Substance and Sexual Activity  . Alcohol use: No  . Drug use: No  . Sexual activity: Yes    Partners: Male  Lifestyle  . Physical activity:    Days per week: Not on file    Minutes per session: Not on file  . Stress: Not on file  Relationships  . Social connections:    Talks on phone: Not on file    Gets together: Not on file    Attends religious service: Not on file    Active member of club or organization: Not on file    Attends meetings of clubs or organizations: Not on file    Relationship status: Not on file  . Intimate partner violence:    Fear of current or ex partner: No    Emotionally abused: No    Physically abused: No    Forced sexual activity: No  Other Topics Concern  . Not on  file  Social History Narrative   Lives with husband.      Outpatient Medications Prior to Visit  Medication Sig Dispense Refill  . acetaminophen (TYLENOL) 500 MG tablet Take 500 mg by mouth every 6 (six) hours as needed for headache (pain).    . Alfalfa 250 MG TABS Take 1,500 mg by mouth 3 (three) times daily.    Marland Kitchen b complex vitamins capsule Take 1 capsule by mouth 2 (two) times daily with a meal.     . calcitRIOL (ROCALTROL) 0.25 MCG capsule Take 0.25 mcg by mouth daily.    Marland Kitchen CARTIA XT 120 MG 24 hr capsule TAKE 1 CAPSULE BY MOUTH ONCE DAILY 90 capsule 1  . carvedilol (COREG) 12.5 MG tablet Take 1 tablet (12.5 mg total) by mouth 3 (three) times daily. 270 tablet 1  . Cholecalciferol (VITAMIN D PO) Take by mouth daily.    . Flaxseed, Linseed, (FLAX SEED OIL PO) Take 1 capsule 2 (two) times daily by mouth.     . hydrALAZINE (APRESOLINE) 10 MG tablet TAKE 2 TABLETS BY MOUTH TWICE DAILY 60 tablet 0  . IRON PO Take 15 mg by mouth daily. [place] product    . L-GLUTAMINE PO Take 1 capsule by mouth daily after supper.     . Probiotic Product (PROBIOTIC DAILY PO) Take 1 tablet by mouth daily with lunch.     . vitamin C (ASCORBIC ACID) 500 MG tablet Take 500 mg by mouth daily as needed (immune system boost).     Marland Kitchen VITAMIN E PO Take 1 capsule by mouth daily with lunch.    . warfarin (COUMADIN) 5 MG tablet TAKE 1 TO 1 & 1/2 (ONE TO ONE & ONE-HALF) TABLETS BY MOUTH ONCE DAILY AS DIRECTED 45 tablet 2   No facility-administered medications prior to visit.     Allergies  Allergen Reactions  . Sulfa Antibiotics Nausea And Vomiting    Review of Systems  Constitutional: Positive for malaise/fatigue. Negative for fever.  HENT: Negative for congestion.   Eyes: Negative for blurred vision.  Respiratory: Negative for shortness of breath.   Cardiovascular: Negative for chest pain, palpitations and leg swelling.  Gastrointestinal: Negative for abdominal pain, blood in stool and nausea.  Genitourinary:  Negative for dysuria and frequency.  Musculoskeletal: Negative for falls.  Skin: Negative for rash.  Neurological: Negative for dizziness, loss of consciousness and headaches.  Endo/Heme/Allergies: Negative for environmental allergies.  Psychiatric/Behavioral: Negative for depression. The patient is not nervous/anxious.        Objective:    Physical Exam Constitutional:      Appearance: Normal appearance.  HENT:     Head: Normocephalic and atraumatic.  Eyes:     General:        Right eye: No discharge.        Left eye: No discharge.  Pulmonary:     Effort: Pulmonary effort is normal.  Neurological:     Mental Status: She is alert and oriented to person, place, and time.  Psychiatric:        Mood and Affect: Mood normal.        Behavior: Behavior normal.     BP 133/71   Pulse 68  Wt Readings from Last 3 Encounters:  03/03/18 144 lb 12.8 oz (65.7 kg)  02/15/18 148 lb 6.4 oz (67.3 kg)  12/21/17 141 lb (64 kg)    Diabetic Foot Exam - Simple   No data filed     Lab Results  Component Value Date   WBC 5.4 12/21/2017   HGB 10.9 (L) 12/21/2017   HCT 33.4 (L) 12/21/2017   PLT 245.0 12/21/2017   GLUCOSE 90 12/21/2017   CHOL 192 10/26/2017   TRIG 43 10/26/2017   HDL 79 10/26/2017   LDLCALC 104 (H) 10/26/2017   ALT 21 12/21/2017   AST 27 12/21/2017   NA 138 12/21/2017   K 4.5 12/21/2017   CL 104 12/21/2017   CREATININE 2.34 (H) 12/21/2017   BUN 49 (H) 12/21/2017   CO2 27 12/21/2017   TSH 1.922 10/25/2017   INR 2.0 05/24/2018   HGBA1C 5.7 (H) 10/25/2017    Lab Results  Component Value Date   TSH 1.922 10/25/2017   Lab Results  Component Value Date   WBC 5.4 12/21/2017   HGB 10.9 (L) 12/21/2017   HCT 33.4 (L) 12/21/2017   MCV 82.9 12/21/2017   PLT 245.0 12/21/2017   Lab Results  Component Value Date   NA 138 12/21/2017   K 4.5 12/21/2017   CHLORIDE 109 03/25/2016   CO2 27 12/21/2017   GLUCOSE 90 12/21/2017   BUN 49 (H) 12/21/2017   CREATININE  2.34 (H) 12/21/2017   BILITOT 0.4 12/21/2017   ALKPHOS 67 12/21/2017   AST 27 12/21/2017   ALT 21 12/21/2017   PROT 5.9 (L) 12/21/2017   ALBUMIN 3.4 (L) 12/21/2017   CALCIUM 9.7 12/21/2017   ANIONGAP 7 10/27/2017   EGFR 20 (L) 03/25/2016   GFR 21.71 (L) 12/21/2017   Lab Results  Component Value Date   CHOL 192 10/26/2017   Lab Results  Component Value Date   HDL 79 10/26/2017   Lab Results  Component Value Date   LDLCALC 104 (H) 10/26/2017   Lab Results  Component Value Date   TRIG 43 10/26/2017   Lab Results  Component Value Date   CHOLHDL 2.4 10/26/2017   Lab Results  Component Value Date   HGBA1C 5.7 (H) 10/25/2017       Assessment & Plan:   Problem List Items Addressed This Visit    Fatigue    This is her greatest frustration. Likely multifactorial. Hydrate well. Maintain a heart healthy diet and get plenty of sleep. Check labs      HTN (hypertension)    Well controlled, no changes to meds. Encouraged heart healthy diet such as the DASH diet and exercise as tolerated.  Chronic renal insufficiency, stage 4 (severe) (HCC)    Hydrate and monitor, avoid OTC meds.       Anemia of chronic renal failure, stage 4 (severe) (HCC)    Increase leafy greens, consider increased lean red meat and using cast iron cookware. Continue to monitor, report any concerns      Hyperlipidemia    Encouraged heart healthy diet, increase exercise, avoid trans fats, consider a krill oil cap daily      Atrial fibrillation ([facility])    Tolerating coumadin         I am having [name] maintain her vitamin C, (Flaxseed, Linseed, (FLAX SEED OIL PO)), b complex vitamins, L-GLUTAMINE PO, Probiotic Product (PROBIOTIC DAILY PO), Alfalfa, IRON PO, VITAMIN E PO, acetaminophen, Cholecalciferol (VITAMIN D PO), Cartia XT, carvedilol, hydrALAZINE, calcitRIOL, and warfarin.  No orders of the defined types were placed in this encounter.   I discussed the assessment and treatment  plan with the patient. The patient was provided an opportunity to ask questions and all were answered. The patient agreed with the plan and demonstrated an understanding of the instructions.   The patient was advised to call back or seek an in-person evaluation if the symptoms worsen or if the condition fails to improve as anticipated.  I provided 25 minutes of non-face-to-face time during this encounter.   [name], MD

## 2018-06-19 NOTE — Assessment & Plan Note (Signed)
Increase leafy greens, consider increased lean red meat and using cast iron cookware. Continue to monitor, report any concerns 

## 2018-06-19 NOTE — Assessment & Plan Note (Signed)
Well controlled, no changes to meds. Encouraged heart healthy diet such as the DASH diet and exercise as tolerated.  °

## 2018-06-19 NOTE — Assessment & Plan Note (Signed)
Encouraged heart healthy diet, increase exercise, avoid trans fats, consider a krill oil cap daily 

## 2018-06-19 NOTE — Assessment & Plan Note (Signed)
Tolerating coumadin 

## 2018-06-20 ENCOUNTER — Telehealth: Payer: Self-pay

## 2018-06-20 NOTE — Telephone Encounter (Signed)

## 2018-06-20 NOTE — Telephone Encounter (Signed)
lmom for prescreen  

## 2018-06-21 ENCOUNTER — Other Ambulatory Visit: Payer: Self-pay

## 2018-06-21 ENCOUNTER — Ambulatory Visit (INDEPENDENT_AMBULATORY_CARE_PROVIDER_SITE_OTHER): Payer: Medicare HMO | Admitting: Pharmacist

## 2018-06-21 ENCOUNTER — Other Ambulatory Visit: Payer: Self-pay | Admitting: Cardiology

## 2018-06-21 DIAGNOSIS — Z7901 Long term (current) use of anticoagulants: Secondary | ICD-10-CM | POA: Diagnosis not present

## 2018-06-21 DIAGNOSIS — I4891 Unspecified atrial fibrillation: Secondary | ICD-10-CM | POA: Diagnosis not present

## 2018-06-21 LAB — POCT INR: INR: 1.9 — AB (ref 2.0–3.0)

## 2018-06-23 DIAGNOSIS — H2513 Age-related nuclear cataract, bilateral: Secondary | ICD-10-CM | POA: Diagnosis not present

## 2018-06-23 DIAGNOSIS — H2511 Age-related nuclear cataract, right eye: Secondary | ICD-10-CM | POA: Diagnosis not present

## 2018-06-23 DIAGNOSIS — H25013 Cortical age-related cataract, bilateral: Secondary | ICD-10-CM | POA: Diagnosis not present

## 2018-06-23 DIAGNOSIS — H18413 Arcus senilis, bilateral: Secondary | ICD-10-CM | POA: Diagnosis not present

## 2018-06-23 DIAGNOSIS — H25043 Posterior subcapsular polar age-related cataract, bilateral: Secondary | ICD-10-CM | POA: Diagnosis not present

## 2018-06-27 DIAGNOSIS — H25812 Combined forms of age-related cataract, left eye: Secondary | ICD-10-CM | POA: Diagnosis not present

## 2018-06-27 DIAGNOSIS — Z961 Presence of intraocular lens: Secondary | ICD-10-CM | POA: Diagnosis not present

## 2018-06-27 DIAGNOSIS — H2511 Age-related nuclear cataract, right eye: Secondary | ICD-10-CM | POA: Diagnosis not present

## 2018-06-27 DIAGNOSIS — H25811 Combined forms of age-related cataract, right eye: Secondary | ICD-10-CM | POA: Diagnosis not present

## 2018-06-28 DIAGNOSIS — H2512 Age-related nuclear cataract, left eye: Secondary | ICD-10-CM | POA: Diagnosis not present

## 2018-07-04 DIAGNOSIS — N184 Chronic kidney disease, stage 4 (severe): Secondary | ICD-10-CM | POA: Diagnosis not present

## 2018-07-04 DIAGNOSIS — E213 Hyperparathyroidism, unspecified: Secondary | ICD-10-CM | POA: Diagnosis not present

## 2018-07-07 DIAGNOSIS — E213 Hyperparathyroidism, unspecified: Secondary | ICD-10-CM | POA: Diagnosis not present

## 2018-07-07 DIAGNOSIS — N184 Chronic kidney disease, stage 4 (severe): Secondary | ICD-10-CM | POA: Diagnosis not present

## 2018-07-07 DIAGNOSIS — I129 Hypertensive chronic kidney disease with stage 1 through stage 4 chronic kidney disease, or unspecified chronic kidney disease: Secondary | ICD-10-CM | POA: Diagnosis not present

## 2018-07-15 DIAGNOSIS — R0989 Other specified symptoms and signs involving the circulatory and respiratory systems: Secondary | ICD-10-CM | POA: Diagnosis not present

## 2018-07-15 DIAGNOSIS — N186 End stage renal disease: Secondary | ICD-10-CM | POA: Diagnosis not present

## 2018-07-15 DIAGNOSIS — I770 Arteriovenous fistula, acquired: Secondary | ICD-10-CM | POA: Diagnosis not present

## 2018-07-15 DIAGNOSIS — N184 Chronic kidney disease, stage 4 (severe): Secondary | ICD-10-CM | POA: Diagnosis not present

## 2018-07-19 ENCOUNTER — Telehealth: Payer: Self-pay

## 2018-07-19 DIAGNOSIS — H11421 Conjunctival edema, right eye: Secondary | ICD-10-CM | POA: Diagnosis not present

## 2018-07-19 DIAGNOSIS — Z961 Presence of intraocular lens: Secondary | ICD-10-CM | POA: Diagnosis not present

## 2018-07-19 DIAGNOSIS — H1131 Conjunctival hemorrhage, right eye: Secondary | ICD-10-CM | POA: Diagnosis not present

## 2018-07-19 DIAGNOSIS — H2512 Age-related nuclear cataract, left eye: Secondary | ICD-10-CM | POA: Diagnosis not present

## 2018-07-19 NOTE — Telephone Encounter (Signed)
lmom for prescreen  

## 2018-07-20 DIAGNOSIS — H2512 Age-related nuclear cataract, left eye: Secondary | ICD-10-CM | POA: Diagnosis not present

## 2018-07-20 DIAGNOSIS — H1131 Conjunctival hemorrhage, right eye: Secondary | ICD-10-CM | POA: Diagnosis not present

## 2018-07-21 DIAGNOSIS — H1849 Other corneal degeneration: Secondary | ICD-10-CM | POA: Diagnosis not present

## 2018-07-21 DIAGNOSIS — H1131 Conjunctival hemorrhage, right eye: Secondary | ICD-10-CM | POA: Diagnosis not present

## 2018-07-22 DIAGNOSIS — H1131 Conjunctival hemorrhage, right eye: Secondary | ICD-10-CM | POA: Diagnosis not present

## 2018-07-22 DIAGNOSIS — H1849 Other corneal degeneration: Secondary | ICD-10-CM | POA: Diagnosis not present

## 2018-07-24 DIAGNOSIS — H1131 Conjunctival hemorrhage, right eye: Secondary | ICD-10-CM | POA: Diagnosis not present

## 2018-07-24 DIAGNOSIS — H1849 Other corneal degeneration: Secondary | ICD-10-CM | POA: Diagnosis not present

## 2018-07-26 DIAGNOSIS — H1849 Other corneal degeneration: Secondary | ICD-10-CM | POA: Diagnosis not present

## 2018-07-26 DIAGNOSIS — H1131 Conjunctival hemorrhage, right eye: Secondary | ICD-10-CM | POA: Diagnosis not present

## 2018-07-27 ENCOUNTER — Ambulatory Visit (INDEPENDENT_AMBULATORY_CARE_PROVIDER_SITE_OTHER): Payer: Medicare HMO | Admitting: Pharmacist Clinician (PhC)/ Clinical Pharmacy Specialist

## 2018-07-27 ENCOUNTER — Other Ambulatory Visit: Payer: Self-pay

## 2018-07-27 DIAGNOSIS — I4891 Unspecified atrial fibrillation: Secondary | ICD-10-CM | POA: Diagnosis not present

## 2018-07-27 DIAGNOSIS — Z7901 Long term (current) use of anticoagulants: Secondary | ICD-10-CM | POA: Diagnosis not present

## 2018-07-27 LAB — POCT INR: INR: 2.9 (ref 2.0–3.0)

## 2018-07-27 NOTE — Patient Instructions (Signed)
Decrease dose to 1 tablet daily except for 1.5 tablets every Monday. Repeat INR in 2 weeks.  Will keep goal to 2-2.5 until eye heals.  Pt to talk to nephrologist about Xarelto vs Eliquis vs warfarin

## 2018-08-03 ENCOUNTER — Telehealth: Payer: Self-pay

## 2018-08-03 NOTE — Telephone Encounter (Signed)

## 2018-08-10 ENCOUNTER — Ambulatory Visit (INDEPENDENT_AMBULATORY_CARE_PROVIDER_SITE_OTHER): Payer: Medicare HMO | Admitting: Pharmacist

## 2018-08-10 ENCOUNTER — Other Ambulatory Visit: Payer: Self-pay

## 2018-08-10 DIAGNOSIS — Z7901 Long term (current) use of anticoagulants: Secondary | ICD-10-CM

## 2018-08-10 DIAGNOSIS — I4891 Unspecified atrial fibrillation: Secondary | ICD-10-CM

## 2018-08-10 LAB — POCT INR: INR: 2.1 (ref 2.0–3.0)

## 2018-08-10 NOTE — Telephone Encounter (Signed)
This encounter was created in error - please disregard.

## 2018-08-11 DIAGNOSIS — Z1159 Encounter for screening for other viral diseases: Secondary | ICD-10-CM | POA: Diagnosis not present

## 2018-08-11 DIAGNOSIS — N185 Chronic kidney disease, stage 5: Secondary | ICD-10-CM | POA: Diagnosis not present

## 2018-08-11 DIAGNOSIS — Z01818 Encounter for other preprocedural examination: Secondary | ICD-10-CM | POA: Diagnosis not present

## 2018-08-16 DIAGNOSIS — D631 Anemia in chronic kidney disease: Secondary | ICD-10-CM | POA: Diagnosis not present

## 2018-08-16 DIAGNOSIS — E213 Hyperparathyroidism, unspecified: Secondary | ICD-10-CM | POA: Diagnosis not present

## 2018-08-16 DIAGNOSIS — N184 Chronic kidney disease, stage 4 (severe): Secondary | ICD-10-CM | POA: Diagnosis not present

## 2018-08-16 DIAGNOSIS — I129 Hypertensive chronic kidney disease with stage 1 through stage 4 chronic kidney disease, or unspecified chronic kidney disease: Secondary | ICD-10-CM | POA: Diagnosis not present

## 2018-08-16 LAB — CBC AND DIFFERENTIAL
HCT: 29 — AB (ref 36–46)
Hemoglobin: 9.4 — AB (ref 12.0–16.0)
WBC: 2.8

## 2018-08-16 LAB — BASIC METABOLIC PANEL
BUN: 48 — AB (ref 4–21)
Creatinine: 3.3 — AB (ref 0.5–1.1)
Glucose: 83
Potassium: 4.2 (ref 3.4–5.3)
Sodium: 142 (ref 137–147)

## 2018-08-17 DIAGNOSIS — H25812 Combined forms of age-related cataract, left eye: Secondary | ICD-10-CM | POA: Diagnosis not present

## 2018-08-17 DIAGNOSIS — Z961 Presence of intraocular lens: Secondary | ICD-10-CM | POA: Diagnosis not present

## 2018-08-17 DIAGNOSIS — H11151 Pinguecula, right eye: Secondary | ICD-10-CM | POA: Diagnosis not present

## 2018-08-18 DIAGNOSIS — Z992 Dependence on renal dialysis: Secondary | ICD-10-CM | POA: Diagnosis not present

## 2018-08-18 DIAGNOSIS — N186 End stage renal disease: Secondary | ICD-10-CM | POA: Diagnosis not present

## 2018-08-18 DIAGNOSIS — I120 Hypertensive chronic kidney disease with stage 5 chronic kidney disease or end stage renal disease: Secondary | ICD-10-CM | POA: Diagnosis not present

## 2018-08-18 DIAGNOSIS — N185 Chronic kidney disease, stage 5: Secondary | ICD-10-CM | POA: Diagnosis not present

## 2018-08-23 ENCOUNTER — Telehealth: Payer: Self-pay

## 2018-08-23 DIAGNOSIS — N186 End stage renal disease: Secondary | ICD-10-CM | POA: Diagnosis not present

## 2018-08-23 DIAGNOSIS — Z01818 Encounter for other preprocedural examination: Secondary | ICD-10-CM | POA: Diagnosis not present

## 2018-08-23 DIAGNOSIS — I120 Hypertensive chronic kidney disease with stage 5 chronic kidney disease or end stage renal disease: Secondary | ICD-10-CM | POA: Diagnosis not present

## 2018-08-23 DIAGNOSIS — Z940 Kidney transplant status: Secondary | ICD-10-CM | POA: Diagnosis not present

## 2018-08-23 NOTE — Telephone Encounter (Signed)
lmom for prescreen  

## 2018-08-25 ENCOUNTER — Other Ambulatory Visit: Payer: Self-pay

## 2018-08-25 ENCOUNTER — Ambulatory Visit (INDEPENDENT_AMBULATORY_CARE_PROVIDER_SITE_OTHER): Payer: Medicare HMO | Admitting: Pharmacist

## 2018-08-25 DIAGNOSIS — I4891 Unspecified atrial fibrillation: Secondary | ICD-10-CM

## 2018-08-25 DIAGNOSIS — Z7901 Long term (current) use of anticoagulants: Secondary | ICD-10-CM | POA: Diagnosis not present

## 2018-08-25 LAB — POCT INR: INR: 1.4 — AB (ref 2.0–3.0)

## 2018-08-30 DIAGNOSIS — R69 Illness, unspecified: Secondary | ICD-10-CM | POA: Diagnosis not present

## 2018-09-01 ENCOUNTER — Other Ambulatory Visit: Payer: Self-pay

## 2018-09-01 ENCOUNTER — Ambulatory Visit (INDEPENDENT_AMBULATORY_CARE_PROVIDER_SITE_OTHER): Payer: Medicare HMO | Admitting: Pharmacist

## 2018-09-01 DIAGNOSIS — Z7901 Long term (current) use of anticoagulants: Secondary | ICD-10-CM | POA: Diagnosis not present

## 2018-09-01 DIAGNOSIS — I4891 Unspecified atrial fibrillation: Secondary | ICD-10-CM

## 2018-09-01 DIAGNOSIS — N184 Chronic kidney disease, stage 4 (severe): Secondary | ICD-10-CM | POA: Diagnosis not present

## 2018-09-01 LAB — POCT INR: INR: 1.9 — AB (ref 2.0–3.0)

## 2018-09-02 DIAGNOSIS — Z961 Presence of intraocular lens: Secondary | ICD-10-CM | POA: Diagnosis not present

## 2018-09-02 DIAGNOSIS — H2512 Age-related nuclear cataract, left eye: Secondary | ICD-10-CM | POA: Diagnosis not present

## 2018-09-02 DIAGNOSIS — H25812 Combined forms of age-related cataract, left eye: Secondary | ICD-10-CM | POA: Diagnosis not present

## 2018-09-06 DIAGNOSIS — I120 Hypertensive chronic kidney disease with stage 5 chronic kidney disease or end stage renal disease: Secondary | ICD-10-CM | POA: Diagnosis not present

## 2018-09-06 DIAGNOSIS — D631 Anemia in chronic kidney disease: Secondary | ICD-10-CM | POA: Diagnosis not present

## 2018-09-06 DIAGNOSIS — N185 Chronic kidney disease, stage 5: Secondary | ICD-10-CM | POA: Diagnosis not present

## 2018-09-07 DIAGNOSIS — R0989 Other specified symptoms and signs involving the circulatory and respiratory systems: Secondary | ICD-10-CM | POA: Diagnosis not present

## 2018-09-07 DIAGNOSIS — N186 End stage renal disease: Secondary | ICD-10-CM | POA: Diagnosis not present

## 2018-09-07 DIAGNOSIS — I6523 Occlusion and stenosis of bilateral carotid arteries: Secondary | ICD-10-CM | POA: Diagnosis not present

## 2018-09-07 DIAGNOSIS — I770 Arteriovenous fistula, acquired: Secondary | ICD-10-CM | POA: Diagnosis not present

## 2018-09-12 ENCOUNTER — Other Ambulatory Visit: Payer: Self-pay | Admitting: Cardiology

## 2018-09-12 NOTE — Telephone Encounter (Signed)
Refill Request.  

## 2018-09-16 ENCOUNTER — Other Ambulatory Visit: Payer: Self-pay

## 2018-09-16 MED ORDER — DILTIAZEM HCL ER COATED BEADS 120 MG PO CP24: 120.0000 mg | ORAL_CAPSULE | Freq: Every day | ORAL | 1 refills | Status: DC

## 2018-09-20 ENCOUNTER — Encounter: Payer: Self-pay | Admitting: Internal Medicine

## 2018-09-20 ENCOUNTER — Ambulatory Visit (INDEPENDENT_AMBULATORY_CARE_PROVIDER_SITE_OTHER): Payer: Medicare HMO | Admitting: Family Medicine

## 2018-09-20 ENCOUNTER — Other Ambulatory Visit: Payer: Self-pay

## 2018-09-20 ENCOUNTER — Encounter: Payer: Self-pay | Admitting: Family Medicine

## 2018-09-20 VITALS — BP 180/62 | HR 83 | Temp 97.0°F | Resp 18 | Wt 142.2 lb

## 2018-09-20 DIAGNOSIS — N184 Chronic kidney disease, stage 4 (severe): Secondary | ICD-10-CM | POA: Diagnosis not present

## 2018-09-20 DIAGNOSIS — Z8679 Personal history of other diseases of the circulatory system: Secondary | ICD-10-CM | POA: Diagnosis not present

## 2018-09-20 DIAGNOSIS — R002 Palpitations: Secondary | ICD-10-CM | POA: Diagnosis not present

## 2018-09-20 DIAGNOSIS — E782 Mixed hyperlipidemia: Secondary | ICD-10-CM

## 2018-09-20 DIAGNOSIS — D649 Anemia, unspecified: Secondary | ICD-10-CM | POA: Diagnosis not present

## 2018-09-20 DIAGNOSIS — I10 Essential (primary) hypertension: Secondary | ICD-10-CM | POA: Diagnosis not present

## 2018-09-20 DIAGNOSIS — K649 Unspecified hemorrhoids: Secondary | ICD-10-CM

## 2018-09-20 DIAGNOSIS — I480 Paroxysmal atrial fibrillation: Secondary | ICD-10-CM | POA: Diagnosis not present

## 2018-09-20 LAB — TSH: TSH: 0.25 u[IU]/mL — ABNORMAL LOW (ref 0.35–4.50)

## 2018-09-20 LAB — LIPID PANEL
Cholesterol: 168 mg/dL (ref 0–200)
HDL: 97 mg/dL (ref >39.00)
LDL Cholesterol: 54 mg/dL (ref 0–99)
NonHDL: 70.59
Total CHOL/HDL Ratio: 2
Triglycerides: 83 mg/dL (ref 0.0–149.0)
VLDL: 16.6 mg/dL (ref 0.0–40.0)

## 2018-09-20 LAB — CBC
HCT: 28.2 % — ABNORMAL LOW (ref 36.0–46.0)
Hemoglobin: 9.3 g/dL — ABNORMAL LOW (ref 12.0–15.0)
MCHC: 33.1 g/dL (ref 30.0–36.0)
MCV: 89.4 fl (ref 78.0–100.0)
Platelets: 198 10*3/uL (ref 150.0–400.0)
RBC: 3.16 Mil/uL — ABNORMAL LOW (ref 3.87–5.11)
RDW: 16.3 % — ABNORMAL HIGH (ref 11.5–15.5)
WBC: 5.5 10*3/uL (ref 4.0–10.5)

## 2018-09-20 LAB — COMPREHENSIVE METABOLIC PANEL
ALT: 19 U/L (ref 0–35)
AST: 25 U/L (ref 0–37)
Albumin: 3.9 g/dL (ref 3.5–5.2)
Alkaline Phosphatase: 44 U/L (ref 39–117)
BUN: 53 mg/dL — ABNORMAL HIGH (ref 6–23)
CO2: 26 mEq/L (ref 19–32)
Calcium: 10.6 mg/dL — ABNORMAL HIGH (ref 8.4–10.5)
Chloride: 106 mEq/L (ref 96–112)
Creatinine, Ser: 3.82 mg/dL — ABNORMAL HIGH (ref 0.40–1.20)
GFR: 11.58 mL/min — CL (ref >60.00)
Glucose, Bld: 93 mg/dL (ref 70–99)
Potassium: 4.9 mEq/L (ref 3.5–5.1)
Sodium: 139 mEq/L (ref 135–145)
Total Bilirubin: 0.4 mg/dL (ref 0.2–1.2)
Total Protein: 6 g/dL (ref 6.0–8.3)

## 2018-09-20 LAB — FERRITIN: Ferritin: 148.1 ng/mL (ref 10.0–291.0)

## 2018-09-20 MED ORDER — HYDROCORTISONE ACETATE 25 MG RE SUPP: 25.0000 mg | Freq: Every evening | RECTAL | 0 refills | Status: DC | PRN

## 2018-09-20 NOTE — Patient Instructions (Addendum)
Red Yeast Rice and or aged or blackened garlic  Prevnar and flu shot consider at pharmacy  L Tryptophan   High Cholesterol  High cholesterol is a condition in which the blood has high levels of a white, waxy, fat-like substance (cholesterol). The human body needs small amounts of cholesterol. The liver makes all the cholesterol that the body needs. Extra (excess) cholesterol comes from the food that we eat. Cholesterol is carried from the liver by the blood through the blood vessels. If you have high cholesterol, deposits (plaques) may build up on the walls of your blood vessels (arteries). Plaques make the arteries narrower and stiffer. Cholesterol plaques increase your risk for heart attack and stroke. Work with your health care provider to keep your cholesterol levels in a healthy range. What increases the risk? This condition is more likely to develop in people who:  Eat foods that are high in animal fat (saturated fat) or cholesterol.  Are overweight.  Are not getting enough exercise.  Have a family history of high cholesterol. What are the signs or symptoms? There are no symptoms of this condition. How is this diagnosed? This condition may be diagnosed from the results of a blood test.  If you are older than age 77, your health care provider may check your cholesterol every 4-6 years.  You may be checked more often if you already have high cholesterol or other risk factors for heart disease. The blood test for cholesterol measures:  "Bad" cholesterol (LDL cholesterol). This is the main type of cholesterol that causes heart disease. The desired level for LDL is less than 100.  "Good" cholesterol (HDL cholesterol). This type helps to protect against heart disease by cleaning the arteries and carrying the LDL away. The desired level for HDL is 60 or higher.  Triglycerides. These are fats that the body can store or burn for energy. The desired number for triglycerides is lower  than 150.  Total cholesterol. This is a measure of the total amount of cholesterol in your blood, including LDL cholesterol, HDL cholesterol, and triglycerides. A healthy number is less than 200. How is this treated? This condition is treated with diet changes, lifestyle changes, and medicines. Diet changes  This may include eating more whole grains, fruits, vegetables, nuts, and fish.  This may also include cutting back on red meat and foods that have a lot of added sugar. Lifestyle changes  Changes may include getting at least 40 minutes of aerobic exercise 3 times a week. Aerobic exercises include walking, biking, and swimming. Aerobic exercise along with a healthy diet can help you maintain a healthy weight.  Changes may also include quitting smoking. Medicines  Medicines are usually given if diet and lifestyle changes have failed to reduce your cholesterol to healthy levels.  Your health care provider may prescribe a statin medicine. Statin medicines have been shown to reduce cholesterol, which can reduce the risk of heart disease. Follow these instructions at home: Eating and drinking If told by your health care provider:  Eat chicken (without skin), fish, veal, shellfish, ground [place] breast, and round or loin cuts of red meat.  Do not eat fried foods or fatty meats, such as hot dogs and salami.  Eat plenty of fruits, such as apples.  Eat plenty of vegetables, such as broccoli, potatoes, and carrots.  Eat beans, peas, and lentils.  Eat grains such as barley, rice, couscous, and bulgur wheat.  Eat pasta without cream sauces.  Use skim or nonfat  milk, and eat low-fat or nonfat yogurt and cheeses.  Do not eat or drink whole milk, cream, ice cream, egg yolks, or hard cheeses.  Do not eat stick margarine or tub margarines that contain trans fats (also called partially hydrogenated oils).  Do not eat saturated tropical oils, such as coconut oil and palm oil.  Do not  eat cakes, cookies, crackers, or other baked goods that contain trans fats.  General instructions  Exercise as directed by your health care provider. Increase your activity level with activities such as gardening, walking, and taking the stairs.  Take over-the-counter and prescription medicines only as told by your health care provider.  Do not use any products that contain nicotine or tobacco, such as cigarettes and e-cigarettes. If you need help quitting, ask your health care provider.  Keep all follow-up visits as told by your health care provider. This is important. Contact a health care provider if:  You are struggling to maintain a healthy diet or weight.  You need help to start on an exercise program.  You need help to stop smoking. Get help right away if:  You have chest pain.  You have trouble breathing. This information is not intended to replace advice given to you by your health care provider. Make sure you discuss any questions you have with your health care provider. Document Released: 01/19/2005 Document Revised: 01/22/2017 Document Reviewed: 07/20/2015 Elsevier Patient Education  2020 [employer].

## 2018-09-20 NOTE — Progress Notes (Signed)
SIM   

## 2018-09-21 ENCOUNTER — Other Ambulatory Visit (INDEPENDENT_AMBULATORY_CARE_PROVIDER_SITE_OTHER): Payer: Medicare HMO

## 2018-09-21 DIAGNOSIS — R7989 Other specified abnormal findings of blood chemistry: Secondary | ICD-10-CM

## 2018-09-21 LAB — T4, FREE: Free T4: 1.35 ng/dL (ref 0.60–1.60)

## 2018-09-21 LAB — T3, FREE: T3, Free: 3 pg/mL (ref 2.3–4.2)

## 2018-09-25 DIAGNOSIS — K644 Residual hemorrhoidal skin tags: Secondary | ICD-10-CM | POA: Insufficient documentation

## 2018-09-25 NOTE — Assessment & Plan Note (Signed)
Given Anusol suppositories and referred to gastroenterology

## 2018-09-25 NOTE — Assessment & Plan Note (Signed)
Has had a fistula placed in left arm awaiting dialyssis

## 2018-09-25 NOTE — Assessment & Plan Note (Signed)
She reports her numbers at home are typically in the 130s over 22s and 60s. Well controlled, no changes to meds. Encouraged heart healthy diet such as the DASH diet and exercise as tolerated.

## 2018-09-25 NOTE — Assessment & Plan Note (Signed)
Has been symptomatic with fatigue and palpitations. Is awaiting ablation once her INR is stable

## 2018-09-25 NOTE — Assessment & Plan Note (Signed)
Referred to cardiovascular division now

## 2018-09-25 NOTE — Progress Notes (Signed)
Subjective:    Patient ID: Jillian Hayes, female    DOB: Mar 12, 1945, 73 y.o.   MRN: [id number]  Chief Complaint  Patient presents with  . 3 Month Follow-up    HPI Patient is in today for follow up on chronic medical concerns including labile blood presure when she goes to see specialists. She also notes her blood pressure has been more normal elsewhere. Tends to run in the 859M systolic. No recent febrile illness or hospitalizations. She continues to struggle with fatigue and insomni. Denies CP/palp/SOB/HA/congestion/fevers/GI or GU c/o. Taking meds as prescribed  Past Medical History:  Diagnosis Date  . Advanced care planning/counseling discussion 06/10/2014   05/31/2014 patient presents copy of HCP and Living Will  . Anemia    iron deficiency  . Anxiety   . Atrial fibrillation ([place])   . Benign fundic gland polyps of stomach   . Bladder polyps 06/25/2010  . Chronic headaches 06/24/2010  . Chronic renal insufficiency, stage 4 (severe) ([place]) 11/09/2015  . Depression 1991   hospitalized  . History of chicken pox 06/25/2010  . Hypercalcemia 02/18/2014  . Hypertension   . Insomnia 06/24/2010  . Multiple chemical sensitivity syndrome 06/25/2010  . Proteinuria 02/18/2014  . Renal insufficiency 03/26/2011  . Valvular heart disease 04/28/2016    Past Surgical History:  Procedure Laterality Date  . cyst on left breast removed Left 1991   benign  . NASAL SEPTUM SURGERY  1986   rhinoplasty  . polyps on bladder removed  1972   benign  . TONSILLECTOMY  1962    Family History  Problem Relation Age of Onset  . Breast cancer Mother 64       left breast removed, 2010 lung  . Diabetes Mother   . Nephrolithiasis Father   . Heart attack Father 53       X 3  . Heart disease Father        smoker  . Hypertension Brother   . Allergic rhinitis Brother   . Melanoma Son 37       melanoma on leg removed  . Pancreatic cancer Maternal Grandmother   . Hypertension Paternal Grandmother   .  Obesity Paternal Grandmother   . Heart attack Paternal Grandfather 86  . Diabetes Maternal Aunt        x 2  . Diabetes Maternal Uncle        x 3  . Asthma Neg Hx   . Eczema Neg Hx   . Urticaria Neg Hx   . Immunodeficiency Neg Hx   . Angioedema Neg Hx     Social History   Socioeconomic History  . Marital status: Married    Spouse name: Not on file  . Number of children: 2  . Years of education: Not on file  . Highest education level: Not on file  Occupational History  . Occupation: retired  Scientific laboratory technician  . Financial resource strain: Not on file  . Food insecurity    Worry: Never true    Inability: Never true  . Transportation needs    Medical: No    Non-medical: No  Tobacco Use  . Smoking status: Never Smoker  . Smokeless tobacco: Never Used  Substance and Sexual Activity  . Alcohol use: No  . Drug use: No  . Sexual activity: Yes    Partners: Male  Lifestyle  . Physical activity    Days per week: Not on file    Minutes per session: Not on file  .  Stress: Not on file  Relationships  . Social Herbalist on phone: Not on file    Gets together: Not on file    Attends religious service: Not on file    Active member of club or organization: Not on file    Attends meetings of clubs or organizations: Not on file    Relationship status: Not on file  . Intimate partner violence    Fear of current or ex partner: No    Emotionally abused: No    Physically abused: No    Forced sexual activity: No  Other Topics Concern  . Not on file  Social History Narrative   Lives with husband.      Outpatient Medications Prior to Visit  Medication Sig Dispense Refill  . acetaminophen (TYLENOL) 500 MG tablet Take 500 mg by mouth every 6 (six) hours as needed for headache (pain).    . Alfalfa 250 MG TABS Take 1,500 mg by mouth 3 (three) times daily.    Marland Kitchen b complex vitamins capsule Take 1 capsule by mouth 2 (two) times daily with a meal.     . calcitRIOL (ROCALTROL)  0.25 MCG capsule Take 0.25 mcg by mouth daily.    . carvedilol (COREG) 12.5 MG tablet Take 1 tablet (12.5 mg total) by mouth 3 (three) times daily. 270 tablet 1  . Cholecalciferol (VITAMIN D PO) Take by mouth daily.    Marland Kitchen diltiazem (CARTIA XT) 120 MG 24 hr capsule Take 1 capsule (120 mg total) by mouth daily. 90 capsule 1  . Flaxseed, Linseed, (FLAX SEED OIL PO) Take 1 capsule 2 (two) times daily by mouth.     . hydrALAZINE (APRESOLINE) 100 MG tablet Take 1 tablet by mouth 2 (two) times daily.    . IRON PO Take 15 mg by mouth daily. Shaklee product    . L-GLUTAMINE PO Take 1 capsule by mouth daily after supper.     . Probiotic Product (PROBIOTIC DAILY PO) Take 1 tablet by mouth daily with lunch.     . vitamin C (ASCORBIC ACID) 500 MG tablet Take 500 mg by mouth daily as needed (immune system boost).     Marland Kitchen VITAMIN E PO Take 1 capsule by mouth daily with lunch.    . warfarin (COUMADIN) 5 MG tablet 1 TO 1 & 1/2 (ONE & ONE-HALF) ONCE DAILY USE AS DIRECTED 45 tablet 0  . hydrALAZINE (APRESOLINE) 10 MG tablet TAKE 2 TABLETS BY MOUTH TWICE DAILY 60 tablet 0  . sevelamer carbonate (RENVELA) 800 MG tablet Take 1,600 mg by mouth 3 (three) times daily.     No facility-administered medications prior to visit.     Allergies  Allergen Reactions  . Sulfa Antibiotics Nausea And Vomiting    Review of Systems  Constitutional: Positive for malaise/fatigue. Negative for fever.  HENT: Negative for congestion.   Eyes: Negative for blurred vision.  Respiratory: Negative for shortness of breath.   Cardiovascular: Negative for chest pain, palpitations and leg swelling.  Gastrointestinal: Positive for blood in stool. Negative for abdominal pain, melena and nausea.  Genitourinary: Negative for dysuria and frequency.  Musculoskeletal: Negative for falls.  Skin: Negative for rash.  Neurological: Negative for dizziness, loss of consciousness and headaches.  Endo/Heme/Allergies: Negative for environmental  allergies.  Psychiatric/Behavioral: Negative for depression. The patient is not nervous/anxious.        Objective:    Physical Exam Vitals signs reviewed.  Constitutional:      General: She  is not in acute distress.    Appearance: She is not diaphoretic.  HENT:     Head: Normocephalic and atraumatic.     Right Ear: External ear normal.     Left Ear: External ear normal.     Nose: Nose normal.     Mouth/Throat:     Pharynx: No oropharyngeal exudate.  Eyes:     General: No scleral icterus.       Right eye: No discharge.        Left eye: No discharge.     Conjunctiva/sclera: Conjunctivae normal.     Pupils: Pupils are equal, round, and reactive to light.  Neck:     Musculoskeletal: Normal range of motion and neck supple.     Thyroid: No thyromegaly.  Cardiovascular:     Heart sounds: Normal heart sounds. No murmur.  Pulmonary:     Effort: Pulmonary effort is normal. No respiratory distress.     Breath sounds: Normal breath sounds. No wheezing or rales.  Abdominal:     General: Bowel sounds are normal. There is no distension.     Palpations: Abdomen is soft. There is no mass.     Tenderness: There is no abdominal tenderness.  Musculoskeletal: Normal range of motion.        General: No tenderness.  Lymphadenopathy:     Cervical: No cervical adenopathy.  Skin:    General: Skin is warm and dry.     Findings: No rash.  Neurological:     Mental Status: She is alert and oriented to person, place, and time.     Cranial Nerves: No cranial nerve deficit.     Coordination: Coordination normal.     Deep Tendon Reflexes: Reflexes are normal and symmetric. Reflexes normal.     BP (!) 180/62 (BP Location: Right Arm, Patient Position: Sitting, Cuff Size: Normal)   Pulse 83   Temp (!) 97 F (36.1 C) (Oral)   Resp 18   Wt 142 lb 3.2 oz (64.5 kg)   SpO2 98%   BMI 22.95 kg/m  Wt Readings from Last 3 Encounters:  09/20/18 142 lb 3.2 oz (64.5 kg)  03/03/18 144 lb 12.8 oz (65.7  kg)  02/15/18 148 lb 6.4 oz (67.3 kg)    Diabetic Foot Exam - Simple   No data filed     Lab Results  Component Value Date   WBC 5.5 09/20/2018   HGB 9.3 (L) 09/20/2018   HCT 28.2 (L) 09/20/2018   PLT 198.0 09/20/2018   GLUCOSE 93 09/20/2018   CHOL 168 09/20/2018   TRIG 83.0 09/20/2018   HDL 97.00 09/20/2018   LDLCALC 54 09/20/2018   ALT 19 09/20/2018   AST 25 09/20/2018   NA 139 09/20/2018   K 4.9 09/20/2018   CL 106 09/20/2018   CREATININE 3.82 (H) 09/20/2018   BUN 53 (H) 09/20/2018   CO2 26 09/20/2018   TSH 0.25 (L) 09/20/2018   INR 1.9 (A) 09/01/2018   HGBA1C 5.7 (H) 10/25/2017    Lab Results  Component Value Date   TSH 0.25 (L) 09/20/2018   Lab Results  Component Value Date   WBC 5.5 09/20/2018   HGB 9.3 (L) 09/20/2018   HCT 28.2 (L) 09/20/2018   MCV 89.4 09/20/2018   PLT 198.0 09/20/2018   Lab Results  Component Value Date   NA 139 09/20/2018   K 4.9 09/20/2018   CHLORIDE 109 03/25/2016   CO2 26 09/20/2018   GLUCOSE 93 09/20/2018  BUN 53 (H) 09/20/2018   CREATININE 3.82 (H) 09/20/2018   BILITOT 0.4 09/20/2018   ALKPHOS 44 09/20/2018   AST 25 09/20/2018   ALT 19 09/20/2018   PROT 6.0 09/20/2018   ALBUMIN 3.9 09/20/2018   CALCIUM 10.6 (H) 09/20/2018   ANIONGAP 7 10/27/2017   EGFR 20 (L) 03/25/2016   GFR 11.58 (LL) 09/20/2018   Lab Results  Component Value Date   CHOL 168 09/20/2018   Lab Results  Component Value Date   HDL 97.00 09/20/2018   Lab Results  Component Value Date   LDLCALC 54 09/20/2018   Lab Results  Component Value Date   TRIG 83.0 09/20/2018   Lab Results  Component Value Date   CHOLHDL 2 09/20/2018   Lab Results  Component Value Date   HGBA1C 5.7 (H) 10/25/2017       Assessment & Plan:   Problem List Items Addressed This Visit    Anemia   Relevant Orders   Ferritin (Completed)   History of cardiac dysrhythmia    Referred to cardiovascular division now      HTN (hypertension) - Primary    She  reports her numbers at home are typically in the 130s over 60s and 60s. Well controlled, no changes to meds. Encouraged heart healthy diet such as the DASH diet and exercise as tolerated.       Relevant Medications   hydrALAZINE (APRESOLINE) 100 MG tablet   Other Relevant Orders   CBC (Completed)   Comprehensive metabolic panel (Completed)   TSH (Completed)   Ambulatory referral to Cardiology   Chronic renal insufficiency, stage 4 (severe) (HCC)    Has had a fistula placed in left arm awaiting dialyssis      Palpitation   Relevant Orders   Ambulatory referral to Cardiology   Hyperlipidemia   Relevant Medications   hydrALAZINE (APRESOLINE) 100 MG tablet   Other Relevant Orders   Lipid panel (Completed)   Atrial fibrillation ([place])    Has been symptomatic with fatigue and palpitations. Is awaiting ablation once her INR is stable      Relevant Medications   hydrALAZINE (APRESOLINE) 100 MG tablet   Hemorrhoids    Given Anusol suppositories and referred to gastroenterology      Relevant Medications   hydrALAZINE (APRESOLINE) 100 MG tablet   Other Relevant Orders   Ambulatory referral to Gastroenterology      I am having [name] start on hydrocortisone. I am also having her maintain her vitamin C, (Flaxseed, Linseed, (FLAX SEED OIL PO)), b complex vitamins, L-GLUTAMINE PO, Probiotic Product (PROBIOTIC DAILY PO), Alfalfa, IRON PO, VITAMIN E PO, acetaminophen, Cholecalciferol (VITAMIN D PO), carvedilol, calcitRIOL, warfarin, diltiazem, sevelamer carbonate, and hydrALAZINE.  Meds ordered this encounter  Medications  . hydrocortisone (ANUSOL-HC) 25 MG suppository    Sig: Place 1 suppository (25 mg total) rectally at bedtime as needed for hemorrhoids or anal itching.    Dispense:  12 suppository    Refill:  0     [name], MD

## 2018-09-29 ENCOUNTER — Other Ambulatory Visit: Payer: Self-pay

## 2018-09-29 ENCOUNTER — Ambulatory Visit (INDEPENDENT_AMBULATORY_CARE_PROVIDER_SITE_OTHER): Payer: Medicare HMO | Admitting: Pharmacist Clinician (PhC)/ Clinical Pharmacy Specialist

## 2018-09-29 ENCOUNTER — Other Ambulatory Visit: Payer: Self-pay | Admitting: *Deleted

## 2018-09-29 DIAGNOSIS — I4891 Unspecified atrial fibrillation: Secondary | ICD-10-CM | POA: Diagnosis not present

## 2018-09-29 DIAGNOSIS — Z7901 Long term (current) use of anticoagulants: Secondary | ICD-10-CM

## 2018-09-29 LAB — POCT INR: INR: 1.7 — AB (ref 2.0–3.0)

## 2018-09-29 MED ORDER — CARVEDILOL 12.5 MG PO TABS: 12.5000 mg | ORAL_TABLET | Freq: Three times a day (TID) | ORAL | 1 refills | Status: DC

## 2018-10-11 DIAGNOSIS — N185 Chronic kidney disease, stage 5: Secondary | ICD-10-CM | POA: Diagnosis not present

## 2018-10-11 DIAGNOSIS — D631 Anemia in chronic kidney disease: Secondary | ICD-10-CM | POA: Diagnosis not present

## 2018-10-12 DIAGNOSIS — N185 Chronic kidney disease, stage 5: Secondary | ICD-10-CM | POA: Diagnosis not present

## 2018-10-12 DIAGNOSIS — I120 Hypertensive chronic kidney disease with stage 5 chronic kidney disease or end stage renal disease: Secondary | ICD-10-CM | POA: Diagnosis not present

## 2018-10-12 DIAGNOSIS — E213 Hyperparathyroidism, unspecified: Secondary | ICD-10-CM | POA: Diagnosis not present

## 2018-10-12 DIAGNOSIS — D631 Anemia in chronic kidney disease: Secondary | ICD-10-CM | POA: Diagnosis not present

## 2018-10-13 ENCOUNTER — Encounter: Payer: Self-pay | Admitting: Cardiology

## 2018-10-13 ENCOUNTER — Ambulatory Visit (INDEPENDENT_AMBULATORY_CARE_PROVIDER_SITE_OTHER): Payer: Medicare HMO | Admitting: Cardiology

## 2018-10-13 ENCOUNTER — Other Ambulatory Visit: Payer: Self-pay

## 2018-10-13 VITALS — BP 176/50 | HR 66 | Temp 97.9°F | Ht 66.0 in | Wt 138.0 lb

## 2018-10-13 DIAGNOSIS — I491 Atrial premature depolarization: Secondary | ICD-10-CM | POA: Diagnosis not present

## 2018-10-13 DIAGNOSIS — I351 Nonrheumatic aortic (valve) insufficiency: Secondary | ICD-10-CM | POA: Diagnosis not present

## 2018-10-13 DIAGNOSIS — I480 Paroxysmal atrial fibrillation: Secondary | ICD-10-CM | POA: Diagnosis not present

## 2018-10-13 DIAGNOSIS — I10 Essential (primary) hypertension: Secondary | ICD-10-CM

## 2018-10-13 DIAGNOSIS — R002 Palpitations: Secondary | ICD-10-CM

## 2018-10-13 DIAGNOSIS — N184 Chronic kidney disease, stage 4 (severe): Secondary | ICD-10-CM | POA: Diagnosis not present

## 2018-10-13 MED ORDER — CARVEDILOL 12.5 MG PO TABS: 12.5000 mg | ORAL_TABLET | Freq: Three times a day (TID) | ORAL | 1 refills | Status: DC

## 2018-10-13 MED ORDER — LABETALOL HCL 100 MG PO TABS: 100.0000 mg | ORAL_TABLET | Freq: Two times a day (BID) | ORAL | 1 refills | Status: DC

## 2018-10-13 MED ORDER — CARVEDILOL 12.5 MG PO TABS: 12.5000 mg | ORAL_TABLET | Freq: Two times a day (BID) | ORAL | 1 refills | Status: DC

## 2018-10-13 NOTE — Progress Notes (Signed)
Cardiology Office Note:    Date:  10/13/2018   ID:  Jillian Hayes, DOB 10-May-1945, MRN [id number]  PCP:  Jillian Lukes, MD  Cardiologist:  Minus Breeding, MD  Electrophysiologist:  None   Referring MD: Jillian Lukes, MD   Chief Complaint  Patient presents with  . Palpitations  . Hypertension    History of Present Illness:    Jillian Hayes is a 73 y.o. female who follows with Dr. Percival Hayes and did see him in  . She has a psat medical history of   with a hx of atrial fibrillation on coumadin, Moderate Aortic insufficiency, Resistant hypertension in the setting of stage 5 chronic kidney disease presents to the office for a follow up due to palpitations.   The patient reports that she has been experincing palpitations for awhile back and was noted to have pacs on her ecg to her pcp along with my family.  Past Medical History:  Diagnosis Date  . Advanced care planning/counseling discussion 06/10/2014   05/31/2014 patient presents copy of HCP and Living Will  . Anemia    iron deficiency  . Anxiety   . Atrial fibrillation ([place])   . Benign fundic gland polyps of stomach   . Bladder polyps 06/25/2010  . Chronic headaches 06/24/2010  . Chronic renal insufficiency, stage 4 (severe) ([place]) 11/09/2015  . Depression 1991   hospitalized  . History of chicken pox 06/25/2010  . Hypercalcemia 02/18/2014  . Hypertension   . Insomnia 06/24/2010  . Multiple chemical sensitivity syndrome 06/25/2010  . Proteinuria 02/18/2014  . Renal insufficiency 03/26/2011  . Valvular heart disease 04/28/2016    Past Surgical History:  Procedure Laterality Date  . cyst on left breast removed Left 1991   benign  . NASAL SEPTUM SURGERY  1986   rhinoplasty  . polyps on bladder removed  1972   benign  . TONSILLECTOMY  1962    Current Medications: Current Meds  Medication Sig  . acetaminophen (TYLENOL) 500 MG tablet Take 500 mg by mouth every 6 (six) hours as needed for headache (pain).  . Alfalfa 250 MG  TABS Take 1,500 mg by mouth 3 (three) times daily.  Marland Kitchen b complex vitamins capsule Take 1 capsule by mouth 2 (two) times daily with a meal.   . calcitRIOL (ROCALTROL) 0.25 MCG capsule Take 0.25 mcg by mouth daily.  . Cholecalciferol (VITAMIN D PO) Take by mouth daily.  Marland Kitchen diltiazem (CARTIA XT) 120 MG 24 hr capsule Take 1 capsule (120 mg total) by mouth daily.  . Flaxseed, Linseed, (FLAX SEED OIL PO) Take 1 capsule 2 (two) times daily by mouth.   . hydrALAZINE (APRESOLINE) 100 MG tablet Take 1 tablet by mouth 2 (two) times daily.  . IRON PO Take 15 mg by mouth daily. Shaklee product  . L-GLUTAMINE PO Take 1 capsule by mouth daily after supper.   . Probiotic Product (PROBIOTIC DAILY PO) Take 1 tablet by mouth daily with lunch.   . sevelamer carbonate (RENVELA) 800 MG tablet Take 1,600 mg by mouth 3 (three) times daily.  . vitamin C (ASCORBIC ACID) 500 MG tablet Take 500 mg by mouth daily as needed (immune system boost).   Marland Kitchen VITAMIN E PO Take 1 capsule by mouth daily with lunch.  . warfarin (COUMADIN) 5 MG tablet 1 TO 1 & 1/2 (ONE & ONE-HALF) ONCE DAILY USE AS DIRECTED  . [DISCONTINUED] carvedilol (COREG) 12.5 MG tablet Take 1 tablet (12.5 mg total) by mouth 3 (  three) times daily.     Allergies:   Sulfa antibiotics   Social History   Socioeconomic History  . Marital status: Married    Spouse name: Not on file  . Number of children: 2  . Years of education: Not on file  . Highest education level: Not on file  Occupational History  . Occupation: retired  Scientific laboratory technician  . Financial resource strain: Not on file  . Food insecurity    Worry: Never true    Inability: Never true  . Transportation needs    Medical: No    Non-medical: No  Tobacco Use  . Smoking status: Never Smoker  . Smokeless tobacco: Never Used  Substance and Sexual Activity  . Alcohol use: No  . Drug use: No  . Sexual activity: Yes    Partners: Male  Lifestyle  . Physical activity    Days per week: Not on file     Minutes per session: Not on file  . Stress: Not on file  Relationships  . Social Herbalist on phone: Not on file    Gets together: Not on file    Attends religious service: Not on file    Active member of club or organization: Not on file    Attends meetings of clubs or organizations: Not on file    Relationship status: Not on file  Other Topics Concern  . Not on file  Social History Narrative   Lives with husband.       Family History: The patient's family history includes Allergic rhinitis in her brother; Breast cancer (age of onset: 51) in her mother; Diabetes in her maternal aunt, maternal uncle, and mother; Heart attack (age of onset: 66) in her paternal grandfather; Heart attack (age of onset: 29) in her father; Heart disease in her father; Hypertension in her brother and paternal grandmother; Melanoma (age of onset: 20) in her son; Nephrolithiasis in her father; Obesity in her paternal grandmother; Pancreatic cancer in her maternal grandmother. There is no history of Asthma, Eczema, Urticaria, Immunodeficiency, or Angioedema.  ROS:   Review of Systems  Constitution: Negative for decreased appetite, diaphoresis, fever and malaise/fatigue.  HENT: Negative for congestion, ear discharge, nosebleeds and sore throat.   Musculoskeletal: Negative for joint pain and stiffness.  Gastrointestinal: Negative for bloating, change in bowel habit, constipation and excessive appetite.  Genitourinary: Negative for bladder incontinence, flank pain and hematuria.  Neurological: Negative for difficulty with concentration, light-headedness, loss of balance, seizures and weakness.  Psychiatric/Behavioral: Negative for depression and substance abuse. The patient is not nervous/anxious.   Allergic/Immunologic: Negative for HIV exposure.    EKGs/Labs/Other Studies Reviewed:    The following studies were reviewed today:  EKG:   The ekg ordered today demonstrates sinus rhythm HR 66 bpm with  frequent pacs. ------------------------------------------------------------------- Study Conclusions   - Left ventricle: The cavity size was normal. Systolic function was   normal. The estimated ejection fraction was in the range of 60%   to 65%. Wall motion was normal; there were no regional wall   motion abnormalities. The study was not technically sufficient to   allow evaluation of LV diastolic dysfunction due to atrial   fibrillation. - Aortic valve: Trileaflet; mildly thickened, mildly calcified   leaflets. There was moderate regurgitation. Valve area (VTI):   2.91 cm^2. Valve area (Vmax): 2.34 cm^2. Valve area (Vmean): 2.67   cm^2. - Left atrium: The atrium was mildly dilated. - Right atrium: The atrium was moderately  dilated. - Tricuspid valve: There was mild-moderate regurgitation. - Pulmonic valve: There was trivial regurgitation. - Pulmonary arteries: Systolic pressure could not be accurately   estimated. - Inferior vena cava: The vessel was mildly dilated. The   respirophasic diameter changes were in the normal range (>= 50%). - Pericardium, extracardiac: A trivial pericardial effusion was   identified posterior to the heart.   Recent Labs: 10/27/2017: Magnesium 2.0 09/20/2018: ALT 19; BUN 53; Creatinine, Ser 3.82; Hemoglobin 9.3; Platelets 198.0; Potassium 4.9; Sodium 139; TSH 0.25  Recent Lipid Panel    Component Value Date/Time   CHOL 168 09/20/2018 1139   TRIG 83.0 09/20/2018 1139   HDL 97.00 09/20/2018 1139   CHOLHDL 2 09/20/2018 1139   VLDL 16.6 09/20/2018 1139   LDLCALC 54 09/20/2018 1139    Physical Exam:    VS:  BP (!) 176/50 (BP Location: Right Arm, Patient Position: Sitting, Cuff Size: Normal)   Pulse 66   Temp 97.9 F (36.6 C)   Ht 5\' 6"  (1.676 m)   Wt 138 lb (62.6 kg)   SpO2 99%   BMI 22.27 kg/m     Wt Readings from Last 3 Encounters:  10/13/18 138 lb (62.6 kg)  09/20/18 142 lb 3.2 oz (64.5 kg)  03/03/18 144 lb 12.8 oz (65.7 kg)      GEN:  Well nourished, well developed in no acute distress HEENT: Normal NECK: No JVD; No carotid bruits LYMPHATICS: No lymphadenopathy CARDIAC: RRR, no murmurs, rubs, gallops RESPIRATORY:  Clear to auscultation without rales, wheezing or rhonchi  ABDOMEN: Soft, non-tender, non-distended EXTREMITIES: no cyanosis, no clubbing, no edema MUSCULOSKELETAL:  No edema; No deformity  SKIN: Warm and dry NEUROLOGIC:  Alert and oriented x 3, nonfocal PSYCHIATRIC:  Normal affect, goof insight.  ASSESSMENT:    1. Essential hypertension   2. Palpitation   3. Nonrheumatic aortic valve insufficiency   4. Paroxysmal atrial fibrillation (HCC)   5. Chronic renal insufficiency, stage 4 (severe) (HCC)   6. Premature atrial complexes    PLAN:    In order of problems listed above:  1. Her EKG today shows evidence of frequent pacs and the patient reports that at time she does feel symptoms of palpitations. I am concern that she may be also going back into afib. I will apply an ambulatory monitor on patient today to access for any heart rate excursions and also rhythm changes.   2. Her blood pressure is not controlled - I was able to discuss with her nephrologist for a switch from Coreg to labetolol.  This should be ok with close follow up.  As they are also waiting for her AV fistula to mature for HD soon - so her options are limited.   3. She will follow back with her primary cardiologist Dr. Percival Hayes.    Medication Adjustments/Labs and Tests Ordered: Current medicines are reviewed at length with the patient today.  Concerns regarding medicines are outlined above.  Orders Placed This Encounter  Procedures  . LONG TERM MONITOR (3-14 DAYS)  . EKG 12-Lead   Meds ordered this encounter  Medications  . DISCONTD: labetalol (NORMODYNE) 100 MG tablet    Sig: Take 1 tablet (100 mg total) by mouth 2 (two) times daily.    Dispense:  180 tablet    Refill:  1  . DISCONTD: carvedilol (COREG) 12.5 MG tablet     Sig: Take 1 tablet (12.5 mg total) by mouth 2 (two) times daily.    Dispense:  180  tablet    Refill:  1  . carvedilol (COREG) 12.5 MG tablet    Sig: Take 1 tablet (12.5 mg total) by mouth 3 (three) times daily.    Dispense:  180 tablet    Refill:  1    Patient Instructions  Medication Instructions:  Your physician recommends that you continue on your current medications as directed. Please refer to the Current Medication list given to you today.    If you need a refill on your cardiac medications before your next appointment, please call your pharmacy.   Lab work: None If you have labs (blood work) drawn today and your tests are completely normal, you will receive your results only by: Marland Kitchen MyChart Message (if you have MyChart) OR . A paper copy in the mail If you have any lab test that is abnormal or we need to change your treatment, we will call you to review the results.  Testing/Procedures: Your physician has recommended that you wear a ZIO monitor. ZIO  monitors are medical devices that record the heart's electrical activity. Doctors most often use these monitors to diagnose arrhythmias. Arrhythmias are problems with the speed or rhythm of the heartbeat. The monitor is a small, portable device. You can wear one while you do your normal daily activities. This is usually used to diagnose what is causing palpitations/syncope (passing out).  WEAR 14 DAYS. DR Jillian Hayes should give you the results.   Follow-Up: At [facility], you and your health needs are our priority.  As part of our continuing mission to provide you with exceptional heart care, we have created designated Provider Care Teams.  These Care Teams include your primary Cardiologist (physician) and Advanced Practice Providers (APPs -  Physician Assistants and Nurse Practitioners) who all work together to provide you with the care you need, when you need it. You will need a follow up appointment  With Dr Jillian Hayes as  previously scheduled.  Any Other Special Instructions Will Be Listed Below (If Applicable).       Rolly Pancake, DO  10/13/2018 10:29 PM    [facility]

## 2018-10-13 NOTE — Patient Instructions (Addendum)
Medication Instructions:  Your physician recommends that you continue on your current medications as directed. Please refer to the Current Medication list given to you today.    If you need a refill on your cardiac medications before your next appointment, please call your pharmacy.   Lab work: None If you have labs (blood work) drawn today and your tests are completely normal, you will receive your results only by: Marland Kitchen MyChart Message (if you have MyChart) OR . A paper copy in the mail If you have any lab test that is abnormal or we need to change your treatment, we will call you to review the results.  Testing/Procedures: Your physician has recommended that you wear a ZIO monitor. ZIO  monitors are medical devices that record the heart's electrical activity. Doctors most often use these monitors to diagnose arrhythmias. Arrhythmias are problems with the speed or rhythm of the heartbeat. The monitor is a small, portable device. You can wear one while you do your normal daily activities. This is usually used to diagnose what is causing palpitations/syncope (passing out).  WEAR 14 DAYS. DR Percival Spanish should give you the results.   Follow-Up: At [facility], you and your health needs are our priority.  As part of our continuing mission to provide you with exceptional heart care, we have created designated Provider Care Teams.  These Care Teams include your primary Cardiologist (physician) and Advanced Practice Providers (APPs -  Physician Assistants and Nurse Practitioners) who all work together to provide you with the care you need, when you need it. You will need a follow up appointment  With Dr Percival Spanish as previously scheduled.  Any Other Special Instructions Will Be Listed Below (If Applicable).

## 2018-10-14 ENCOUNTER — Telehealth: Payer: Self-pay | Admitting: Cardiology

## 2018-10-14 DIAGNOSIS — N185 Chronic kidney disease, stage 5: Secondary | ICD-10-CM | POA: Diagnosis not present

## 2018-10-14 DIAGNOSIS — N186 End stage renal disease: Secondary | ICD-10-CM | POA: Diagnosis not present

## 2018-10-14 DIAGNOSIS — I871 Compression of vein: Secondary | ICD-10-CM | POA: Diagnosis not present

## 2018-10-14 NOTE — Telephone Encounter (Signed)
Spoke with Belenda Cruise at [facility]. Questioning whether to fill labetalol and coreg. Informed we discontinued the labetalol and she is supposed to continue Coreg.

## 2018-10-14 NOTE — Telephone Encounter (Signed)
Belenda Cruise from [facility] has a question about script

## 2018-10-18 ENCOUNTER — Ambulatory Visit: Payer: Medicare HMO | Admitting: Internal Medicine

## 2018-10-18 ENCOUNTER — Encounter: Payer: Self-pay | Admitting: Internal Medicine

## 2018-10-18 VITALS — BP 180/50 | HR 60 | Temp 97.8°F | Ht 66.0 in | Wt 138.0 lb

## 2018-10-18 DIAGNOSIS — K5903 Drug induced constipation: Secondary | ICD-10-CM | POA: Diagnosis not present

## 2018-10-18 DIAGNOSIS — K644 Residual hemorrhoidal skin tags: Secondary | ICD-10-CM

## 2018-10-18 MED ORDER — HYDROCORTISONE (PERIANAL) 2.5 % EX CREA: TOPICAL_CREAM | Freq: Two times a day (BID) | CUTANEOUS | 1 refills | Status: DC | PRN

## 2018-10-18 NOTE — Patient Instructions (Signed)
We are giving you a printed rx for the [facility] to take to the pharmacy. Use this for up to 2 weeks daily and then as needed.   We are giving you a coupon for miralax, please take one dose daily.  We are providing you with a handout to read on Hemorrhoids.   Come back and see [place] in 4-6 weeks please.   I appreciate the opportunity to care for you. Silvano Rusk, MD, [facility]

## 2018-10-18 NOTE — Assessment & Plan Note (Signed)
Related to phosphate binder sevelamer carbonate Add daily MiraLAX to her regimen to try to help treat this and reduce hemorrhoid symptoms

## 2018-10-18 NOTE — Assessment & Plan Note (Signed)
Banding will not work for these, will try hydrocortisone cream.  Hopefully she can afford that none of the hydrocortisone creams or suppositories are on her formulary.  She had up suppository prescribed that was $90 for 3 of them before.  Treating her constipation should help as well.  I will follow her up in 4 to 6 weeks.  I think the constipation related to the phosphate binder is driving this.

## 2018-10-18 NOTE — Progress Notes (Signed)
Jillian Hayes 73 y.o. 1946/01/10 [id number]  Assessment & Plan:  External prolapsed hemorrhoids Banding will not work for these, will try hydrocortisone cream.  Hopefully she can afford that none of the hydrocortisone creams or suppositories are on her formulary.  She had up suppository prescribed that was $90 for 3 of them before.  Treating her constipation should help as well.  I will follow her up in 4 to 6 weeks.  I think the constipation related to the phosphate binder is driving this.  Drug-induced constipation Related to phosphate binder sevelamer carbonate Add daily MiraLAX to her regimen to try to help treat this and reduce hemorrhoid symptoms    I appreciate the opportunity to care for this patient. CC: [name], MD   Subjective:   Chief Complaint: Hemorrhoids  HPI The patient is here because of hemorrhoids, she has leakage of clear mucus in between bowel movements, some slight fecal seepage, and prolapsed hemorrhoids with some bleeding and irritation.  This began to be a problem after starting a phosphate binder for progressive renal disease.  She is having bowel movements but she has to strain quite a bit.  There is slight discomfort but no sharp pain or anything suggestive of a fissure.  Colonoscopy in 2015- for colorectal neoplasia due for 2025 follow-up screening.  Has had what sounds like an anal tag since delivering children. Allergies  Allergen Reactions  . Sulfa Antibiotics Nausea And Vomiting   Current Meds  Medication Sig  . acetaminophen (TYLENOL) 500 MG tablet Take 500 mg by mouth every 6 (six) hours as needed for headache (pain).  . Alfalfa 250 MG TABS Take 1,500 mg by mouth 3 (three) times daily.  Marland Kitchen atorvastatin (LIPITOR) 40 MG tablet Take 40 mg by mouth daily.  Marland Kitchen b complex vitamins capsule Take 1 capsule by mouth 2 (two) times daily with a meal.   . calcitRIOL (ROCALTROL) 0.25 MCG capsule Take 0.25 mcg by mouth daily.  . carvedilol (COREG)  12.5 MG tablet Take 1 tablet (12.5 mg total) by mouth 3 (three) times daily.  . Cholecalciferol (VITAMIN D PO) Take by mouth daily.  Marland Kitchen diltiazem (CARTIA XT) 120 MG 24 hr capsule Take 1 capsule (120 mg total) by mouth daily.  . Flaxseed, Linseed, (FLAX SEED OIL PO) Take 1 capsule 2 (two) times daily by mouth.   . hydrALAZINE (APRESOLINE) 100 MG tablet Take 1 tablet by mouth 2 (two) times daily.  . IRON PO Take 15 mg by mouth daily. Shaklee product  . L-GLUTAMINE PO Take 1 capsule by mouth daily after supper.   . Probiotic Product (PROBIOTIC DAILY PO) Take 1 tablet by mouth daily with lunch.   . sevelamer carbonate (RENVELA) 800 MG tablet Take 1,600 mg by mouth 3 (three) times daily.  . vitamin C (ASCORBIC ACID) 500 MG tablet Take 500 mg by mouth daily as needed (immune system boost).   Marland Kitchen VITAMIN E PO Take 1 capsule by mouth daily with lunch.  . warfarin (COUMADIN) 5 MG tablet 1 TO 1 & 1/2 (ONE & ONE-HALF) ONCE DAILY USE AS DIRECTED   Past Medical History:  Diagnosis Date  . Advanced care planning/counseling discussion 06/10/2014   05/31/2014 patient presents copy of HCP and Living Will  . Anemia    iron deficiency  . Anxiety   . Atrial fibrillation ([place])   . Benign fundic gland polyps of stomach   . Bladder polyps 06/25/2010  . Chronic headaches 06/24/2010  . Chronic renal  insufficiency, stage 4 (severe) ([place]) 11/09/2015  . Depression 1991   hospitalized  . History of chicken pox 06/25/2010  . Hypercalcemia 02/18/2014  . Hypertension   . Insomnia 06/24/2010  . Multiple chemical sensitivity syndrome 06/25/2010  . Proteinuria 02/18/2014  . Renal insufficiency 03/26/2011  . Valvular heart disease 04/28/2016   Past Surgical History:  Procedure Laterality Date  . COLONOSCOPY  2014  . cyst on left breast removed Left 1991   benign  . ESOPHAGOGASTRODUODENOSCOPY (EGD) WITH ESOPHAGEAL DILATION  2014  . NASAL SEPTUM SURGERY  1986   rhinoplasty  . polyps on bladder removed  1972   benign  .  TONSILLECTOMY  1962   Social History   Social History Narrative   Married and lives with husband.     Retired   1 son and 1 daughter   No alcohol tobacco or caffeine   family history includes Allergic rhinitis in her brother; Breast cancer (age of onset: 71) in her mother; Diabetes in her maternal aunt, maternal uncle, and mother; Heart attack (age of onset: 29) in her paternal grandfather; Heart attack (age of onset: 57) in her father; Heart disease in her father; Hypertension in her brother and paternal grandmother; Melanoma (age of onset: 62) in her son; Nephrolithiasis in her father; Obesity in her paternal grandmother; Pancreatic cancer in her maternal grandmother.   Review of Systems As per HPI.  She has insomnia some pedal edema , she has decreased hearing, palpitations, fatigue and some dyspnea, back pain and other joint pains and some urine leakage.  All other review of systems negative.  Blood pressure does go up when she sees other physicians and has been high lately.  No headaches or visual disturbances today here in the office.  Objective:   Physical Exam BP (!) 180/50   Pulse 60   Temp 97.8 F (36.6 C) (Oral)   Ht 5\' 6"  (1.676 m)   Wt 138 lb (62.6 kg)   BMI 22.27 kg/m  Well-developed well-nourished white woman no acute distress The eyes are anicteric Appropriate mood and affect She is alert and oriented x3 No cyanosis clubbing or edema in the extremities Abdomen is soft and mildly protuberant  Rectal exam with Patti Martinique, [place] present demonstrates a prolapsed inflamed right anterior external hemorrhoid seen on inspection.  There are some small fleshy tags as well.  Rectal exam is nontender with some slight spasm, no rectocele or mass there is a fair amount of formed stool in the rectal vault. Prolapsed inflmed RA ext  Anoscopy with Patti Martinique, CMA present demonstrates the same right anterior inflamed external hemorrhoid and a left lateral/posterior external  hemorrhoid component as well.  Grade 1 internal hemorrhoids without inflammation.

## 2018-10-19 ENCOUNTER — Other Ambulatory Visit: Payer: Self-pay

## 2018-10-19 ENCOUNTER — Ambulatory Visit (INDEPENDENT_AMBULATORY_CARE_PROVIDER_SITE_OTHER): Payer: Medicare HMO | Admitting: Pharmacist

## 2018-10-19 DIAGNOSIS — Z7901 Long term (current) use of anticoagulants: Secondary | ICD-10-CM | POA: Diagnosis not present

## 2018-10-19 DIAGNOSIS — Z01812 Encounter for preprocedural laboratory examination: Secondary | ICD-10-CM | POA: Diagnosis not present

## 2018-10-19 DIAGNOSIS — Z20828 Contact with and (suspected) exposure to other viral communicable diseases: Secondary | ICD-10-CM | POA: Diagnosis not present

## 2018-10-19 DIAGNOSIS — N186 End stage renal disease: Secondary | ICD-10-CM | POA: Diagnosis not present

## 2018-10-19 DIAGNOSIS — I4891 Unspecified atrial fibrillation: Secondary | ICD-10-CM

## 2018-10-19 LAB — POCT INR: INR: 2.7 (ref 2.0–3.0)

## 2018-10-24 DIAGNOSIS — I4891 Unspecified atrial fibrillation: Secondary | ICD-10-CM | POA: Diagnosis not present

## 2018-10-24 DIAGNOSIS — Y839 Surgical procedure, unspecified as the cause of abnormal reaction of the patient, or of later complication, without mention of misadventure at the time of the procedure: Secondary | ICD-10-CM | POA: Diagnosis not present

## 2018-10-24 DIAGNOSIS — T82590A Other mechanical complication of surgically created arteriovenous fistula, initial encounter: Secondary | ICD-10-CM | POA: Diagnosis not present

## 2018-10-24 DIAGNOSIS — T82898A Other specified complication of vascular prosthetic devices, implants and grafts, initial encounter: Secondary | ICD-10-CM | POA: Diagnosis not present

## 2018-10-24 DIAGNOSIS — Z9889 Other specified postprocedural states: Secondary | ICD-10-CM | POA: Diagnosis not present

## 2018-10-24 DIAGNOSIS — N185 Chronic kidney disease, stage 5: Secondary | ICD-10-CM | POA: Diagnosis not present

## 2018-10-24 DIAGNOSIS — Z7901 Long term (current) use of anticoagulants: Secondary | ICD-10-CM | POA: Diagnosis not present

## 2018-10-31 ENCOUNTER — Other Ambulatory Visit: Payer: Self-pay | Admitting: Cardiology

## 2018-11-08 DIAGNOSIS — N185 Chronic kidney disease, stage 5: Secondary | ICD-10-CM | POA: Diagnosis not present

## 2018-11-10 DIAGNOSIS — I120 Hypertensive chronic kidney disease with stage 5 chronic kidney disease or end stage renal disease: Secondary | ICD-10-CM | POA: Diagnosis not present

## 2018-11-10 DIAGNOSIS — D631 Anemia in chronic kidney disease: Secondary | ICD-10-CM | POA: Diagnosis not present

## 2018-11-10 DIAGNOSIS — E213 Hyperparathyroidism, unspecified: Secondary | ICD-10-CM | POA: Diagnosis not present

## 2018-11-10 DIAGNOSIS — N185 Chronic kidney disease, stage 5: Secondary | ICD-10-CM | POA: Diagnosis not present

## 2018-11-14 ENCOUNTER — Ambulatory Visit (INDEPENDENT_AMBULATORY_CARE_PROVIDER_SITE_OTHER): Payer: Medicare HMO

## 2018-11-14 DIAGNOSIS — R002 Palpitations: Secondary | ICD-10-CM

## 2018-11-17 ENCOUNTER — Other Ambulatory Visit: Payer: Self-pay

## 2018-11-17 ENCOUNTER — Ambulatory Visit (INDEPENDENT_AMBULATORY_CARE_PROVIDER_SITE_OTHER): Payer: Medicare HMO | Admitting: Pharmacist Clinician (PhC)/ Clinical Pharmacy Specialist

## 2018-11-17 DIAGNOSIS — Z7901 Long term (current) use of anticoagulants: Secondary | ICD-10-CM | POA: Diagnosis not present

## 2018-11-17 DIAGNOSIS — I4891 Unspecified atrial fibrillation: Secondary | ICD-10-CM | POA: Diagnosis not present

## 2018-11-17 LAB — POCT INR: INR: 2.4 (ref 2.0–3.0)

## 2018-11-22 ENCOUNTER — Other Ambulatory Visit: Payer: Self-pay

## 2018-11-22 ENCOUNTER — Ambulatory Visit (INDEPENDENT_AMBULATORY_CARE_PROVIDER_SITE_OTHER): Payer: Medicare HMO | Admitting: Family Medicine

## 2018-11-22 DIAGNOSIS — N184 Chronic kidney disease, stage 4 (severe): Secondary | ICD-10-CM

## 2018-11-22 DIAGNOSIS — I359 Nonrheumatic aortic valve disorder, unspecified: Secondary | ICD-10-CM | POA: Diagnosis not present

## 2018-11-22 DIAGNOSIS — G4700 Insomnia, unspecified: Secondary | ICD-10-CM

## 2018-11-22 DIAGNOSIS — I480 Paroxysmal atrial fibrillation: Secondary | ICD-10-CM | POA: Diagnosis not present

## 2018-11-22 DIAGNOSIS — I10 Essential (primary) hypertension: Secondary | ICD-10-CM

## 2018-11-25 DIAGNOSIS — I770 Arteriovenous fistula, acquired: Secondary | ICD-10-CM | POA: Diagnosis not present

## 2018-11-25 DIAGNOSIS — N186 End stage renal disease: Secondary | ICD-10-CM | POA: Diagnosis not present

## 2018-11-27 NOTE — Assessment & Plan Note (Signed)
Continues to follow with cardiology.   

## 2018-11-27 NOTE — Assessment & Plan Note (Addendum)
Hydrate and continue to follow with nephrology. They have just tried to place a second fistula as the first one was unsuccessful. So far she has had no complications other than some constipation which was painful and flared hemorrhoids but has now resolved.

## 2018-11-27 NOTE — Assessment & Plan Note (Signed)
Well controlled, no changes to meds. Encouraged heart healthy diet such as the DASH diet and exercise as tolerated. Cardiology added a Clonidine patch which patient has tolerated well

## 2018-11-27 NOTE — Assessment & Plan Note (Signed)
Tolerating Coumadin 

## 2018-11-27 NOTE — Assessment & Plan Note (Signed)
Encouraged good sleep hygiene such as dark, quiet room. No blue/green glowing lights such as computer screens in bedroom. No alcohol or stimulants in evening. Cut down on caffeine as able. Regular exercise is helpful but not just prior to bed time. Using Melatonin

## 2018-11-27 NOTE — Progress Notes (Signed)
Virtual Visit via Video Note  I connected with Jillian Hayes on 11/22/18 at  2:00 PM EDT by a video enabled telemedicine application and verified that I am speaking with the correct person using two identifiers.  Location: Patient: home Provider: office   I discussed the limitations of evaluation and management by telemedicine and the availability of in person appointments. The patient expressed understanding and agreed to proceed. Magdalene Molly, CMA was able to get the patient set up on a video visit.    Subjective:    Patient ID: Jillian Hayes, female    DOB: 1945-06-17, 73 y.o.   MRN: [id number]  No chief complaint on file.   HPI Patient is in today for follow up on chronic medical concerns including atrial fibrillation, hypertension, renal insufficiency and more. She feels well today but has recently undergone a second surgery to place a fistula after the first one failed. She had some constipation and hemorrhoids after her surgery but those have resolved. She is otherwise feeling better. Her blood pressure has done better since cardiology started her on a Clonidine patch. Denies CP/SOB/HA/congestion/fevers/GI or GU c/o. Taking meds as prescribed  Past Medical History:  Diagnosis Date  . Advanced care planning/counseling discussion 06/10/2014   05/31/2014 patient presents copy of HCP and Living Will  . Anemia    iron deficiency  . Anxiety   . Atrial fibrillation ([place])   . Benign fundic gland polyps of stomach   . Bladder polyps 06/25/2010  . Chronic headaches 06/24/2010  . Chronic renal insufficiency, stage 4 (severe) ([place]) 11/09/2015  . Depression 1991   hospitalized  . History of chicken pox 06/25/2010  . Hypercalcemia 02/18/2014  . Hypertension   . Insomnia 06/24/2010  . Multiple chemical sensitivity syndrome 06/25/2010  . Proteinuria 02/18/2014  . Renal insufficiency 03/26/2011  . Valvular heart disease 04/28/2016    Past Surgical History:  Procedure Laterality Date  .  COLONOSCOPY  2014  . cyst on left breast removed Left 1991   benign  . ESOPHAGOGASTRODUODENOSCOPY (EGD) WITH ESOPHAGEAL DILATION  2014  . NASAL SEPTUM SURGERY  1986   rhinoplasty  . polyps on bladder removed  1972   benign  . TONSILLECTOMY  1962    Family History  Problem Relation Age of Onset  . Breast cancer Mother 55       left breast removed, 2010 lung  . Diabetes Mother   . Nephrolithiasis Father   . Heart attack Father 87       X 3  . Heart disease Father        smoker  . Hypertension Brother   . Allergic rhinitis Brother   . Melanoma Son 37       melanoma on leg removed  . Pancreatic cancer Maternal Grandmother   . Hypertension Paternal Grandmother   . Obesity Paternal Grandmother   . Heart attack Paternal Grandfather 41  . Diabetes Maternal Aunt        x 2  . Diabetes Maternal Uncle        x 3  . Asthma Neg Hx   . Eczema Neg Hx   . Urticaria Neg Hx   . Immunodeficiency Neg Hx   . Angioedema Neg Hx     Social History   Socioeconomic History  . Marital status: Married    Spouse name: Not on file  . Number of children: 2  . Years of education: Not on file  . Highest education level: Not on  file  Occupational History  . Occupation: retired  Scientific laboratory technician  . Financial resource strain: Not on file  . Food insecurity    Worry: Never true    Inability: Never true  . Transportation needs    Medical: No    Non-medical: No  Tobacco Use  . Smoking status: Never Smoker  . Smokeless tobacco: Never Used  Substance and Sexual Activity  . Alcohol use: No  . Drug use: No  . Sexual activity: Yes    Partners: Male  Lifestyle  . Physical activity    Days per week: Not on file    Minutes per session: Not on file  . Stress: Not on file  Relationships  . Social Herbalist on phone: Not on file    Gets together: Not on file    Attends religious service: Not on file    Active member of club or organization: Not on file    Attends meetings of clubs  or organizations: Not on file    Relationship status: Not on file  . Intimate partner violence    Fear of current or ex partner: No    Emotionally abused: No    Physically abused: No    Forced sexual activity: No  Other Topics Concern  . Not on file  Social History Narrative   Married and lives with husband.     Retired   1 son and 1 daughter   No alcohol tobacco or caffeine    Outpatient Medications Prior to Visit  Medication Sig Dispense Refill  . acetaminophen (TYLENOL) 500 MG tablet Take 500 mg by mouth every 6 (six) hours as needed for headache (pain).    . Alfalfa 250 MG TABS Take 1,500 mg by mouth 3 (three) times daily.    Marland Kitchen atorvastatin (LIPITOR) 40 MG tablet Take 40 mg by mouth daily.    Marland Kitchen b complex vitamins capsule Take 1 capsule by mouth 2 (two) times daily with a meal.     . calcitRIOL (ROCALTROL) 0.25 MCG capsule Take 0.25 mcg by mouth daily.    . carvedilol (COREG) 12.5 MG tablet Take 1 tablet (12.5 mg total) by mouth 3 (three) times daily. 180 tablet 1  . Cholecalciferol (VITAMIN D PO) Take by mouth daily.    Marland Kitchen diltiazem (CARTIA XT) 120 MG 24 hr capsule Take 1 capsule (120 mg total) by mouth daily. 90 capsule 1  . Flaxseed, Linseed, (FLAX SEED OIL PO) Take 1 capsule 2 (two) times daily by mouth.     . hydrALAZINE (APRESOLINE) 100 MG tablet Take 1 tablet by mouth 2 (two) times daily.    . hydrocortisone (ANUSOL-HC) 2.5 % rectal cream Place rectally 2 (two) times daily as needed for hemorrhoids. 30 g 1  . IRON PO Take 15 mg by mouth daily. Shaklee product    . L-GLUTAMINE PO Take 1 capsule by mouth daily after supper.     . Probiotic Product (PROBIOTIC DAILY PO) Take 1 tablet by mouth daily with lunch.     . sevelamer carbonate (RENVELA) 800 MG tablet Take 1,600 mg by mouth 3 (three) times daily.    . vitamin C (ASCORBIC ACID) 500 MG tablet Take 500 mg by mouth daily as needed (immune system boost).     Marland Kitchen VITAMIN E PO Take 1 capsule by mouth daily with lunch.    .  warfarin (COUMADIN) 5 MG tablet TAKE 1 TO 1 & 1/2 (ONE TO ONE & ONE-HALF) TABLETS  BY MOUTH ONCE DAILY AS DIRECTED 45 tablet 0   No facility-administered medications prior to visit.     Allergies  Allergen Reactions  . Sulfa Antibiotics Nausea And Vomiting    Review of Systems  Constitutional: Positive for malaise/fatigue. Negative for fever.  HENT: Negative for congestion.   Eyes: Negative for blurred vision.  Respiratory: Negative for shortness of breath.   Cardiovascular: Positive for palpitations. Negative for chest pain and leg swelling.  Gastrointestinal: Positive for abdominal pain and constipation. Negative for blood in stool and nausea.  Genitourinary: Negative for dysuria and frequency.  Musculoskeletal: Negative for falls.  Skin: Negative for rash.  Neurological: Negative for dizziness, loss of consciousness and headaches.  Endo/Heme/Allergies: Negative for environmental allergies.  Psychiatric/Behavioral: Negative for depression. The patient is not nervous/anxious.        Objective:    Physical Exam Constitutional:      Appearance: Normal appearance.  HENT:     Head: Normocephalic and atraumatic.     Nose: Nose normal.  Eyes:     General:        Right eye: No discharge.        Left eye: No discharge.  Pulmonary:     Effort: Pulmonary effort is normal.  Neurological:     Mental Status: She is alert and oriented to person, place, and time.  Psychiatric:        Mood and Affect: Mood normal.        Behavior: Behavior normal.     BP (!) 126/54 (BP Location: Right Arm, Patient Position: Sitting, Cuff Size: Normal)   Pulse (!) 49   Wt 142 lb 12.8 oz (64.8 kg)   BMI 23.05 kg/m  Wt Readings from Last 3 Encounters:  11/22/18 142 lb 12.8 oz (64.8 kg)  10/18/18 138 lb (62.6 kg)  10/13/18 138 lb (62.6 kg)    Diabetic Foot Exam - Simple   No data filed     Lab Results  Component Value Date   WBC 5.5 09/20/2018   HGB 9.3 (L) 09/20/2018   HCT 28.2 (L)  09/20/2018   PLT 198.0 09/20/2018   GLUCOSE 93 09/20/2018   CHOL 168 09/20/2018   TRIG 83.0 09/20/2018   HDL 97.00 09/20/2018   LDLCALC 54 09/20/2018   ALT 19 09/20/2018   AST 25 09/20/2018   NA 139 09/20/2018   K 4.9 09/20/2018   CL 106 09/20/2018   CREATININE 3.82 (H) 09/20/2018   BUN 53 (H) 09/20/2018   CO2 26 09/20/2018   TSH 0.25 (L) 09/20/2018   INR 2.4 11/17/2018   HGBA1C 5.7 (H) 10/25/2017    Lab Results  Component Value Date   TSH 0.25 (L) 09/20/2018   Lab Results  Component Value Date   WBC 5.5 09/20/2018   HGB 9.3 (L) 09/20/2018   HCT 28.2 (L) 09/20/2018   MCV 89.4 09/20/2018   PLT 198.0 09/20/2018   Lab Results  Component Value Date   NA 139 09/20/2018   K 4.9 09/20/2018   CHLORIDE 109 03/25/2016   CO2 26 09/20/2018   GLUCOSE 93 09/20/2018   BUN 53 (H) 09/20/2018   CREATININE 3.82 (H) 09/20/2018   BILITOT 0.4 09/20/2018   ALKPHOS 44 09/20/2018   AST 25 09/20/2018   ALT 19 09/20/2018   PROT 6.0 09/20/2018   ALBUMIN 3.9 09/20/2018   CALCIUM 10.6 (H) 09/20/2018   ANIONGAP 7 10/27/2017   EGFR 20 (L) 03/25/2016   GFR 11.58 (LL) 09/20/2018  Lab Results  Component Value Date   CHOL 168 09/20/2018   Lab Results  Component Value Date   HDL 97.00 09/20/2018   Lab Results  Component Value Date   LDLCALC 54 09/20/2018   Lab Results  Component Value Date   TRIG 83.0 09/20/2018   Lab Results  Component Value Date   CHOLHDL 2 09/20/2018   Lab Results  Component Value Date   HGBA1C 5.7 (H) 10/25/2017       Assessment & Plan:   Problem List Items Addressed This Visit    Insomnia    Encouraged good sleep hygiene such as dark, quiet room. No blue/green glowing lights such as computer screens in bedroom. No alcohol or stimulants in evening. Cut down on caffeine as able. Regular exercise is helpful but not just prior to bed time. Using Melatonin      HTN (hypertension)    Well controlled, no changes to meds. Encouraged heart healthy diet  such as the DASH diet and exercise as tolerated. Cardiology added a Clonidine patch which patient has tolerated well      Chronic renal insufficiency, stage 4 (severe) ([place])    Hydrate and continue to follow with nephrology. They have just tried to place a second fistula as the first one was unsuccessful. So far she has had no complications other than some constipation which was painful and flared hemorrhoids but has now resolved.       Aortic valve disease    Continues to follow with cardiology       Atrial fibrillation ([place])    Tolerating Coumadin.          I am having [name] maintain her vitamin C, (Flaxseed, Linseed, (FLAX SEED OIL PO)), b complex vitamins, L-GLUTAMINE PO, Probiotic Product (PROBIOTIC DAILY PO), Alfalfa, IRON PO, VITAMIN E PO, acetaminophen, Cholecalciferol (VITAMIN D PO), calcitRIOL, diltiazem, sevelamer carbonate, hydrALAZINE, atorvastatin, carvedilol, hydrocortisone, and warfarin.  No orders of the defined types were placed in this encounter.    [name], MD    I discussed the assessment and treatment plan with the patient. The patient was provided an opportunity to ask questions and all were answered. The patient agreed with the plan and demonstrated an understanding of the instructions.   The patient was advised to call back or seek an in-person evaluation if the symptoms worsen or if the condition fails to improve as anticipated.  I provided 25 minutes of non-face-to-face time during this encounter.   [name], MD

## 2018-11-30 DIAGNOSIS — N185 Chronic kidney disease, stage 5: Secondary | ICD-10-CM | POA: Diagnosis not present

## 2018-11-30 DIAGNOSIS — D631 Anemia in chronic kidney disease: Secondary | ICD-10-CM | POA: Diagnosis not present

## 2018-11-30 DIAGNOSIS — E213 Hyperparathyroidism, unspecified: Secondary | ICD-10-CM | POA: Diagnosis not present

## 2018-12-01 ENCOUNTER — Ambulatory Visit: Payer: Medicare HMO | Admitting: Internal Medicine

## 2018-12-05 ENCOUNTER — Encounter: Payer: Self-pay | Admitting: Family Medicine

## 2018-12-08 DIAGNOSIS — R002 Palpitations: Secondary | ICD-10-CM | POA: Diagnosis not present

## 2018-12-09 DIAGNOSIS — R5382 Chronic fatigue, unspecified: Secondary | ICD-10-CM | POA: Diagnosis not present

## 2018-12-09 DIAGNOSIS — I120 Hypertensive chronic kidney disease with stage 5 chronic kidney disease or end stage renal disease: Secondary | ICD-10-CM | POA: Diagnosis not present

## 2018-12-09 DIAGNOSIS — E213 Hyperparathyroidism, unspecified: Secondary | ICD-10-CM | POA: Diagnosis not present

## 2018-12-09 DIAGNOSIS — I4891 Unspecified atrial fibrillation: Secondary | ICD-10-CM | POA: Diagnosis not present

## 2018-12-09 DIAGNOSIS — N185 Chronic kidney disease, stage 5: Secondary | ICD-10-CM | POA: Diagnosis not present

## 2018-12-09 DIAGNOSIS — D631 Anemia in chronic kidney disease: Secondary | ICD-10-CM | POA: Diagnosis not present

## 2018-12-13 ENCOUNTER — Other Ambulatory Visit: Payer: Self-pay | Admitting: Cardiology

## 2018-12-13 DIAGNOSIS — Z01812 Encounter for preprocedural laboratory examination: Secondary | ICD-10-CM | POA: Diagnosis not present

## 2018-12-13 DIAGNOSIS — Z20828 Contact with and (suspected) exposure to other viral communicable diseases: Secondary | ICD-10-CM | POA: Diagnosis not present

## 2018-12-13 DIAGNOSIS — N186 End stage renal disease: Secondary | ICD-10-CM | POA: Diagnosis not present

## 2018-12-14 NOTE — Telephone Encounter (Signed)
Please review for refill. Thanks!  

## 2018-12-19 DIAGNOSIS — N185 Chronic kidney disease, stage 5: Secondary | ICD-10-CM | POA: Diagnosis not present

## 2018-12-19 DIAGNOSIS — Z4901 Encounter for fitting and adjustment of extracorporeal dialysis catheter: Secondary | ICD-10-CM | POA: Diagnosis not present

## 2018-12-21 ENCOUNTER — Telehealth: Payer: Self-pay | Admitting: *Deleted

## 2018-12-21 DIAGNOSIS — D631 Anemia in chronic kidney disease: Secondary | ICD-10-CM | POA: Diagnosis not present

## 2018-12-21 DIAGNOSIS — N186 End stage renal disease: Secondary | ICD-10-CM | POA: Diagnosis not present

## 2018-12-21 DIAGNOSIS — N2581 Secondary hyperparathyroidism of renal origin: Secondary | ICD-10-CM | POA: Diagnosis not present

## 2018-12-21 NOTE — Telephone Encounter (Signed)
Left message to return call and make an appointment in the next 2 weeks to discuss monitor results.

## 2018-12-21 NOTE — Telephone Encounter (Signed)
-----   Message from Berniece Salines, DO sent at 12/19/2018  8:16 AM EST ----- Please schedule patient with me within the next 2 weeks.  Like to discuss her monitor results

## 2018-12-23 DIAGNOSIS — D631 Anemia in chronic kidney disease: Secondary | ICD-10-CM | POA: Diagnosis not present

## 2018-12-23 DIAGNOSIS — N186 End stage renal disease: Secondary | ICD-10-CM | POA: Diagnosis not present

## 2018-12-23 DIAGNOSIS — N2581 Secondary hyperparathyroidism of renal origin: Secondary | ICD-10-CM | POA: Diagnosis not present

## 2018-12-23 DIAGNOSIS — Z7901 Long term (current) use of anticoagulants: Secondary | ICD-10-CM | POA: Diagnosis not present

## 2018-12-25 DIAGNOSIS — D631 Anemia in chronic kidney disease: Secondary | ICD-10-CM | POA: Diagnosis not present

## 2018-12-25 DIAGNOSIS — N186 End stage renal disease: Secondary | ICD-10-CM | POA: Diagnosis not present

## 2018-12-25 DIAGNOSIS — N2581 Secondary hyperparathyroidism of renal origin: Secondary | ICD-10-CM | POA: Diagnosis not present

## 2018-12-25 DIAGNOSIS — Z7901 Long term (current) use of anticoagulants: Secondary | ICD-10-CM | POA: Diagnosis not present

## 2018-12-26 ENCOUNTER — Ambulatory Visit: Payer: Self-pay | Admitting: Pharmacist

## 2018-12-27 DIAGNOSIS — D631 Anemia in chronic kidney disease: Secondary | ICD-10-CM | POA: Diagnosis not present

## 2018-12-27 DIAGNOSIS — N186 End stage renal disease: Secondary | ICD-10-CM | POA: Diagnosis not present

## 2018-12-27 DIAGNOSIS — N2581 Secondary hyperparathyroidism of renal origin: Secondary | ICD-10-CM | POA: Diagnosis not present

## 2018-12-28 DIAGNOSIS — Z20828 Contact with and (suspected) exposure to other viral communicable diseases: Secondary | ICD-10-CM | POA: Diagnosis not present

## 2018-12-28 DIAGNOSIS — Z01812 Encounter for preprocedural laboratory examination: Secondary | ICD-10-CM | POA: Diagnosis not present

## 2018-12-28 DIAGNOSIS — N186 End stage renal disease: Secondary | ICD-10-CM | POA: Diagnosis not present

## 2018-12-30 DIAGNOSIS — N2581 Secondary hyperparathyroidism of renal origin: Secondary | ICD-10-CM | POA: Diagnosis not present

## 2018-12-30 DIAGNOSIS — D631 Anemia in chronic kidney disease: Secondary | ICD-10-CM | POA: Diagnosis not present

## 2018-12-30 DIAGNOSIS — N186 End stage renal disease: Secondary | ICD-10-CM | POA: Diagnosis not present

## 2019-01-02 DIAGNOSIS — Y838 Other surgical procedures as the cause of abnormal reaction of the patient, or of later complication, without mention of misadventure at the time of the procedure: Secondary | ICD-10-CM | POA: Diagnosis not present

## 2019-01-02 DIAGNOSIS — Z992 Dependence on renal dialysis: Secondary | ICD-10-CM | POA: Diagnosis not present

## 2019-01-02 DIAGNOSIS — N186 End stage renal disease: Secondary | ICD-10-CM | POA: Diagnosis not present

## 2019-01-02 DIAGNOSIS — T82858A Stenosis of vascular prosthetic devices, implants and grafts, initial encounter: Secondary | ICD-10-CM | POA: Diagnosis not present

## 2019-01-03 DIAGNOSIS — Z23 Encounter for immunization: Secondary | ICD-10-CM | POA: Diagnosis not present

## 2019-01-03 DIAGNOSIS — N186 End stage renal disease: Secondary | ICD-10-CM | POA: Diagnosis not present

## 2019-01-03 DIAGNOSIS — N2581 Secondary hyperparathyroidism of renal origin: Secondary | ICD-10-CM | POA: Diagnosis not present

## 2019-01-03 DIAGNOSIS — D509 Iron deficiency anemia, unspecified: Secondary | ICD-10-CM | POA: Diagnosis not present

## 2019-01-03 DIAGNOSIS — D631 Anemia in chronic kidney disease: Secondary | ICD-10-CM | POA: Diagnosis not present

## 2019-01-04 DIAGNOSIS — D509 Iron deficiency anemia, unspecified: Secondary | ICD-10-CM | POA: Diagnosis not present

## 2019-01-04 DIAGNOSIS — N2581 Secondary hyperparathyroidism of renal origin: Secondary | ICD-10-CM | POA: Diagnosis not present

## 2019-01-04 DIAGNOSIS — Z23 Encounter for immunization: Secondary | ICD-10-CM | POA: Diagnosis not present

## 2019-01-04 DIAGNOSIS — D631 Anemia in chronic kidney disease: Secondary | ICD-10-CM | POA: Diagnosis not present

## 2019-01-04 DIAGNOSIS — N186 End stage renal disease: Secondary | ICD-10-CM | POA: Diagnosis not present

## 2019-01-06 DIAGNOSIS — N186 End stage renal disease: Secondary | ICD-10-CM | POA: Diagnosis not present

## 2019-01-06 DIAGNOSIS — D509 Iron deficiency anemia, unspecified: Secondary | ICD-10-CM | POA: Diagnosis not present

## 2019-01-06 DIAGNOSIS — D631 Anemia in chronic kidney disease: Secondary | ICD-10-CM | POA: Diagnosis not present

## 2019-01-06 DIAGNOSIS — N2581 Secondary hyperparathyroidism of renal origin: Secondary | ICD-10-CM | POA: Diagnosis not present

## 2019-01-06 DIAGNOSIS — Z23 Encounter for immunization: Secondary | ICD-10-CM | POA: Diagnosis not present

## 2019-01-09 DIAGNOSIS — Z7901 Long term (current) use of anticoagulants: Secondary | ICD-10-CM | POA: Diagnosis not present

## 2019-01-09 DIAGNOSIS — N2581 Secondary hyperparathyroidism of renal origin: Secondary | ICD-10-CM | POA: Diagnosis not present

## 2019-01-09 DIAGNOSIS — N186 End stage renal disease: Secondary | ICD-10-CM | POA: Diagnosis not present

## 2019-01-09 DIAGNOSIS — Z23 Encounter for immunization: Secondary | ICD-10-CM | POA: Diagnosis not present

## 2019-01-09 DIAGNOSIS — D631 Anemia in chronic kidney disease: Secondary | ICD-10-CM | POA: Diagnosis not present

## 2019-01-09 DIAGNOSIS — D509 Iron deficiency anemia, unspecified: Secondary | ICD-10-CM | POA: Diagnosis not present

## 2019-01-10 ENCOUNTER — Encounter: Payer: Self-pay | Admitting: Cardiology

## 2019-01-10 ENCOUNTER — Telehealth: Payer: Self-pay | Admitting: Cardiology

## 2019-01-10 ENCOUNTER — Other Ambulatory Visit: Payer: Self-pay

## 2019-01-10 ENCOUNTER — Ambulatory Visit (INDEPENDENT_AMBULATORY_CARE_PROVIDER_SITE_OTHER): Payer: Medicare HMO | Admitting: Cardiology

## 2019-01-10 VITALS — BP 158/70 | HR 68 | Ht 66.0 in | Wt 132.0 lb

## 2019-01-10 DIAGNOSIS — I472 Ventricular tachycardia: Secondary | ICD-10-CM | POA: Diagnosis not present

## 2019-01-10 DIAGNOSIS — I10 Essential (primary) hypertension: Secondary | ICD-10-CM

## 2019-01-10 DIAGNOSIS — I491 Atrial premature depolarization: Secondary | ICD-10-CM | POA: Diagnosis not present

## 2019-01-10 DIAGNOSIS — I4729 Other ventricular tachycardia: Secondary | ICD-10-CM

## 2019-01-10 DIAGNOSIS — I480 Paroxysmal atrial fibrillation: Secondary | ICD-10-CM | POA: Diagnosis not present

## 2019-01-10 DIAGNOSIS — N186 End stage renal disease: Secondary | ICD-10-CM | POA: Diagnosis not present

## 2019-01-10 MED ORDER — CARVEDILOL 25 MG PO TABS: 25.0000 mg | ORAL_TABLET | Freq: Two times a day (BID) | ORAL | 3 refills | Status: DC

## 2019-01-10 NOTE — Telephone Encounter (Signed)
Pt. Is requesting a call back. She has a question that she forgot to ask while she was here earlier

## 2019-01-10 NOTE — Progress Notes (Signed)
Cardiology Office Note:    Date:  01/10/2019   ID:  Jillian Hayes, DOB 06-Oct-1945, MRN [id number]  PCP:  [name], MD  Cardiologist:  Minus Breeding, MD  Electrophysiologist:  None   Referring MD: [name], MD   Chief Complaint  Patient presents with  . Follow-up    History of Present Illness:    Jillian Hayes is a 73 y.o. female who follows with Dr. Percival Spanish and did see him in  . She has a psat medical history of   with a hx of atrial fibrillation on coumadin, Moderate Aortic insufficiency, Resistant hypertension, the patient recently started hemodialysis and has undergone 9 treatments already.  I did see the patient October 13, 2018 at that time she reported that she has been experiencing increasing palpitations.  And due to her history of atrial fibrillation and to understand if the patient was experiencing rapid ventricular rates and also how often she was going back into atrial fibrillation.  In addition her nephrologist wanted to understand if it was okay for the patient to be switched from carvedilol to labetalol and I did discuss with the nephrologist telling her to this would be all right.  The patient is here today for follow-up visit.  She denies any chest pain, shortness of breath, nausea, vomiting.  And notes that she still having intermittent palpitations.  As stated above she has recently been started on her hemodialysis and has been responding well to this treatment.  She was however taken off of diltiazem due to increasing calcium levels.  Past Medical History:  Diagnosis Date  . Advanced care planning/counseling discussion 06/10/2014   05/31/2014 patient presents copy of HCP and Living Will  . Anemia    iron deficiency  . Anxiety   . Atrial fibrillation ([place])   . Benign fundic gland polyps of stomach   . Bladder polyps 06/25/2010  . Chronic headaches 06/24/2010  . Chronic renal insufficiency, stage 4 (severe) ([place]) 11/09/2015  . Depression 1991   hospitalized  . History of chicken pox 06/25/2010  . Hypercalcemia 02/18/2014  . Hypertension   . Insomnia 06/24/2010  . Multiple chemical sensitivity syndrome 06/25/2010  . Proteinuria 02/18/2014  . Renal insufficiency 03/26/2011  . Valvular heart disease 04/28/2016    Past Surgical History:  Procedure Laterality Date  . COLONOSCOPY  2014  . cyst on left breast removed Left 1991   benign  . ESOPHAGOGASTRODUODENOSCOPY (EGD) WITH ESOPHAGEAL DILATION  2014  . NASAL SEPTUM SURGERY  1986   rhinoplasty  . polyps on bladder removed  1972   benign  . TONSILLECTOMY  1962    Current Medications: Current Meds  Medication Sig  . acetaminophen (TYLENOL) 500 MG tablet Take 500 mg by mouth every 6 (six) hours as needed for headache (pain).  . Alfalfa 250 MG TABS Take 1,500 mg by mouth 3 (three) times daily.  Marland Kitchen atorvastatin (LIPITOR) 40 MG tablet Take 40 mg by mouth daily.  Marland Kitchen b complex vitamins capsule Take 1 capsule by mouth 2 (two) times daily with a meal.   . calcitRIOL (ROCALTROL) 0.25 MCG capsule Take 0.25 mcg by mouth daily.  . Cholecalciferol (VITAMIN D PO) Take by mouth daily.  . Flaxseed, Linseed, (FLAX SEED OIL PO) Take 1 capsule 2 (two) times daily by mouth.   . furosemide (LASIX) 20 MG tablet Take 20 mg by mouth daily as needed. Take once daily as needed for leg swelling  . hydrALAZINE (APRESOLINE) 100 MG  tablet Take 1 tablet by mouth 2 (two) times daily.  . hydrocortisone (ANUSOL-HC) 2.5 % rectal cream Place rectally 2 (two) times daily as needed for hemorrhoids.  . IRON PO Take 15 mg by mouth daily. [place] product  . L-GLUTAMINE PO Take 1 capsule by mouth daily after supper.   . multivitamin (RENA-VIT) TABS tablet Take 1 tablet by mouth daily.  . Probiotic Product (PROBIOTIC DAILY PO) Take 1 tablet by mouth daily with lunch.   . sevelamer carbonate (RENVELA) 800 MG tablet Take 1,600 mg by mouth 3 (three) times daily.  . vitamin C (ASCORBIC ACID) 500 MG tablet Take 500 mg by mouth  daily as needed (immune system boost).   Marland Kitchen VITAMIN E PO Take 1 capsule by mouth daily with lunch.  . warfarin (COUMADIN) 5 MG tablet TAKE 1 & 1/2 (ONE & ONE-HALF) TABLETS BY MOUTH ONCE DAILY AS DIRECTED  . [DISCONTINUED] carvedilol (COREG) 12.5 MG tablet Take 1 tablet (12.5 mg total) by mouth 3 (three) times daily.  . [DISCONTINUED] diltiazem (CARTIA XT) 120 MG 24 hr capsule Take 1 capsule (120 mg total) by mouth daily.     Allergies:   Sulfa antibiotics   Social History   Socioeconomic History  . Marital status: Married    Spouse name: Not on file  . Number of children: 2  . Years of education: Not on file  . Highest education level: Not on file  Occupational History  . Occupation: retired  Scientific laboratory technician  . Financial resource strain: Not on file  . Food insecurity    Worry: Never true    Inability: Never true  . Transportation needs    Medical: No    Non-medical: No  Tobacco Use  . Smoking status: Never Smoker  . Smokeless tobacco: Never Used  Substance and Sexual Activity  . Alcohol use: No  . Drug use: No  . Sexual activity: Yes    Partners: Male  Lifestyle  . Physical activity    Days per week: Not on file    Minutes per session: Not on file  . Stress: Not on file  Relationships  . Social Herbalist on phone: Not on file    Gets together: Not on file    Attends religious service: Not on file    Active member of club or organization: Not on file    Attends meetings of clubs or organizations: Not on file    Relationship status: Not on file  Other Topics Concern  . Not on file  Social History Narrative   Married and lives with husband.     Retired   1 son and 1 daughter   No alcohol tobacco or caffeine     Family History: The patient's family history includes Allergic rhinitis in her brother; Breast cancer (age of onset: 25) in her mother; Diabetes in her maternal aunt, maternal uncle, and mother; Heart attack (age of onset: 24) in her paternal  grandfather; Heart attack (age of onset: 71) in her father; Heart disease in her father; Hypertension in her brother and paternal grandmother; Melanoma (age of onset: 33) in her son; Nephrolithiasis in her father; Obesity in her paternal grandmother; Pancreatic cancer in her maternal grandmother. There is no history of Asthma, Eczema, Urticaria, Immunodeficiency, or Angioedema.  ROS:   Review of Systems  Constitution: Negative for decreased appetite, fever and weight gain.  HENT: Negative for congestion, ear discharge, hoarse voice and sore throat.   Eyes: Negative  for discharge, redness, vision loss in right eye and visual halos.  Cardiovascular: Negative for chest pain, dyspnea on exertion, leg swelling, orthopnea and palpitations.  Respiratory: Negative for cough, hemoptysis, shortness of breath and snoring.   Endocrine: Negative for heat intolerance and polyphagia.  Hematologic/Lymphatic: Negative for bleeding problem. Does not bruise/bleed easily.  Skin: Negative for flushing, nail changes, rash and suspicious lesions.  Musculoskeletal: Negative for arthritis, joint pain, muscle cramps, myalgias, neck pain and stiffness.  Gastrointestinal: Negative for abdominal pain, bowel incontinence, diarrhea and excessive appetite.  Genitourinary: Negative for decreased libido, genital sores and incomplete emptying.  Neurological: Negative for brief paralysis, focal weakness, headaches and loss of balance.  Psychiatric/Behavioral: Negative for altered mental status, depression and suicidal ideas.  Allergic/Immunologic: Negative for HIV exposure and persistent infections.    EKGs/Labs/Other Studies Reviewed:    The following studies were reviewed today:   EKG: None today  Zio monitor 12/12/2018 The patient wore the monitor for 14 days starting 11/14/2018. Indication: Palpitations The minimum heart rate was 52 bpm, maximum heart rate was 160 bpm, and average heart rate was 67 bpm. The  predominant underlying rhythm was Sinus Rhythm.  1 run of Ventricular Tachycardia occurred lasting 5 beats with a max rate of 156 bpm (avg 116 bpm).  98 Supraventricular Tachycardia runs occurred, the run with the fastest interval lasting 4 beats with a max rate of 160 bpm, the longest lasting 12.6 secs with an avg rate of 124 bpm.  Premature atrial complexes were frequent (20.9%, [id number]). Premature ventricular complexes were frequent. (5.5%, [id number]). Ventricular Trigeminy was present. No AV block, No pause and no atrial fibrillation present. 6 out of 7 patient triggered event was associated with premature atrial complexes.  Conclusion: This study is remarkable the following:                             1. Nonsustained ventricular tachycardia - 1 five beat run                             2. Supraventricular tachycardia - which is likely atrial tachycardia (98 episodes)                             3. Symptomatic premature atrial complexes.  Recent Labs: 09/20/2018: ALT 19; BUN 53; Creatinine, Ser 3.82; Hemoglobin 9.3; Platelets 198.0; Potassium 4.9; Sodium 139; TSH 0.25  Recent Lipid Panel    Component Value Date/Time   CHOL 168 09/20/2018 1139   TRIG 83.0 09/20/2018 1139   HDL 97.00 09/20/2018 1139   CHOLHDL 2 09/20/2018 1139   VLDL 16.6 09/20/2018 1139   LDLCALC 54 09/20/2018 1139    Physical Exam:    VS:  BP (!) 158/70 (BP Location: Right Arm, Patient Position: Sitting, Cuff Size: Normal)   Pulse 68   Ht 5\' 6"  (1.676 m)   Wt 132 lb (59.9 kg)   SpO2 95%   BMI 21.31 kg/m     Wt Readings from Last 3 Encounters:  01/10/19 132 lb (59.9 kg)  11/22/18 142 lb 12.8 oz (64.8 kg)  10/18/18 138 lb (62.6 kg)     GEN: Well nourished, well developed in no acute distress HEENT: Normal NECK: No JVD; No carotid bruits LYMPHATICS: No lymphadenopathy CARDIAC: S1S2 noted,RRR, no murmurs, rubs, gallops RESPIRATORY:  Clear to auscultation without rales, wheezing or  rhonchi  ABDOMEN: Soft,  non-tender, non-distended, +bowel sounds, no guarding. EXTREMITIES: No edema, No cyanosis, no clubbing MUSCULOSKELETAL:  No edema; No deformity  SKIN: Warm and dry NEUROLOGIC:  Alert and oriented x 3, non-focal PSYCHIATRIC:  Normal affect, good insight  ASSESSMENT:    1. Nonsustained ventricular tachycardia (HCC)   2. PAC (premature atrial contraction)   3. Paroxysmal atrial fibrillation (HCC)   4. Resistant hypertension   5. ESRD (end stage renal disease) ([place])    PLAN:    1.  I was able to discuss her monitor results with patient today.  With her symptomatic PACs and uncontrolled blood pressure I would like to increase her carvedilol at this time to 25 mg twice daily.  She is in agreement with this change.  2.  5 beats of nonsustained ventricular tachycardia is noted-with this I will like to pursue an ischemic evaluation to rule out coronary artery disease as a cause of her arrhythmia.  She did undergo pharmacologic nuclear stress test back in 2016 which was reported as normal.  I discussed with patient about repeating this test and she prefer not to get a pharmacologic nuclear stress test as she tells me that in 2016 after her test she was sick from the tracer for several days.  With this a CTA coronaries would be appropriate.  We have discussed that she will get her CTA in between her hemodialysis days which will be probably on a Tuesday or Thursday.   3.  She will continue to follow the Coumadin clinic for her INR goal of 2-3.  Her most recent INR which was October 15 is reported that 2.4.  4.  She has an echocardiogram scheduled on March 03, 2018.  And hopes to follow-up with Dr. Percival Spanish after that.  The patient is in agreement with the above plan. The patient left the office in stable condition.  The patient will follow up Dr. Percival Spanish in January.   Medication Adjustments/Labs and Tests Ordered: Current medicines are reviewed at length with the patient today.  Concerns  regarding medicines are outlined above.  Orders Placed This Encounter  Procedures  . CT CORONARY FRACTIONAL FLOW RESERVE DATA PREP  . CT CORONARY FRACTIONAL FLOW RESERVE FLUID ANALYSIS  . CT CORONARY MORPH W/CTA COR W/SCORE W/CA W/CM &/OR WO/CM   Meds ordered this encounter  Medications  . carvedilol (COREG) 25 MG tablet    Sig: Take 1 tablet (25 mg total) by mouth 2 (two) times daily.    Dispense:  180 tablet    Refill:  3    Patient Instructions  Medication Instructions:  Your physician has recommended you make the following change in your medication:   INCREASE: Coreg 25 mg Take 1 tab twice daily( May take 2 tab of 12.5 mg twice daily until you run out)  *If you need a refill on your cardiac medications before your next appointment, please call your pharmacy*  Lab Work: None If you have labs (blood work) drawn today and your tests are completely normal, you will receive your results only by: Marland Kitchen MyChart Message (if you have MyChart) OR . A paper copy in the mail If you have any lab test that is abnormal or we need to change your treatment, we will call you to review the results.  Testing/Procedures: Your physician has requested that you have cardiac CT. Cardiac computed tomography (CT) is a painless test that uses an x-ray machine to take clear, detailed pictures of your heart. For  further information please visit [email or web address]. Please follow instruction sheet as given.  Your cardiac CT will be scheduled at one of the below locations:   [facility] [address] [place], [place] [address] [phone]  If scheduled at [facility], please arrive at the [facility] main entrance of [facility] 30-45 minutes prior to test start time. Proceed to the [facility] Radiology Department (first floor) to check-in and test prep.  Please follow these instructions carefully (unless otherwise directed):  On the Night Before the Test: . Be sure to  Drink plenty of water. . Do not consume any caffeinated/decaffeinated beverages or chocolate 12 hours prior to your test. . Do not take any antihistamines 12 hours prior to your test.  On the Day of the Test: . Drink plenty of water. Do not drink any water within one hour of the test. . Do not eat any food 4 hours prior to the test. . You may take your regular medications prior to the test.  . Take carvedilol (Coreg) two hours prior to test. . HOLD Furosemide morning of the test. . FEMALES- please wear underwire-free bra if available                  -If HR is less than 55 BPM- No Beta Blocker(Cavedilol)                -IF HR is greater than 55 BPM and patient is less than or equal to 32 yrs old Coreg(carvedilol) x1.           After the Test: . Drink plenty of water. . After receiving IV contrast, you may experience a mild flushed feeling. This is normal. . On occasion, you may experience a mild rash up to 24 hours after the test. This is not dangerous. If this occurs, you can take Benadryl 25 mg and increase your fluid intake. . If you experience trouble breathing, this can be serious. If it is severe call 911 IMMEDIATELY. If it is mild, please call our office.  Once we have confirmed authorization from your insurance company, we will call you to set up a date and time for your test.   For non-scheduling related questions, please contact the cardiac imaging nurse navigator should you have any questions/concerns: Marchia Bond, RN Navigator Cardiac Imaging [facility] [phone] Office    Follow-Up: At [facility], you and your health needs are our priority.  As part of our continuing mission to provide you with exceptional heart care, we have created designated Provider Care Teams.  These Care Teams include your primary Cardiologist (physician) and Advanced Practice Providers (APPs -  Physician Assistants and Nurse Practitioners) who all work together to  provide you with the care you need, when you need it.  Your next appointment:    Keep Dr Percival Spanish appointment  The format for your next appointment:   In Person  Provider:   Minus Breeding, MD  Other Instructions  Cardiac CT Angiogram  A cardiac CT angiogram is a procedure to look at the heart and the area around the heart. It may be done to help find the cause of chest pains or other symptoms of heart disease. During this procedure, a large X-ray machine, called a CT scanner, takes detailed pictures of the heart and the surrounding area after a dye (contrast material) has been injected into blood vessels in the area. The procedure is also sometimes called a  coronary CT angiogram, coronary artery scanning, or CTA. A cardiac CT angiogram allows the health care provider to see how well blood is flowing to and from the heart. The health care provider will be able to see if there are any problems, such as:  Blockage or narrowing of the coronary arteries in the heart.  Fluid around the heart.  Signs of weakness or disease in the muscles, valves, and tissues of the heart. Tell a health care provider about:  Any allergies you have. This is especially important if you have had a previous allergic reaction to contrast dye.  All medicines you are taking, including vitamins, herbs, eye drops, creams, and over-the-counter medicines.  Any blood disorders you have.  Any surgeries you have had.  Any medical conditions you have.  Whether you are pregnant or may be pregnant.  Any anxiety disorders, chronic pain, or other conditions you have that may increase your stress or prevent you from lying still. What are the risks? Generally, this is a safe procedure. However, problems may occur, including:  Bleeding.  Infection.  Allergic reactions to medicines or dyes.  Damage to other structures or organs.  Kidney damage from the dye or contrast that is used.  Increased risk of cancer from  radiation exposure. This risk is low. Talk with your health care provider about: ? The risks and benefits of testing. ? How you can receive the lowest dose of radiation. What happens before the procedure?  Wear comfortable clothing and remove any jewelry, glasses, dentures, and hearing aids.  Follow instructions from your health care provider about eating and drinking. This may include: ? For 12 hours before the test - avoid caffeine. This includes tea, coffee, soda, energy drinks, and diet pills. Drink plenty of water or other fluids that do not have caffeine in them. Being well-hydrated can prevent complications. ? For 4-6 hours before the test - stop eating and drinking. The contrast dye can cause nausea, but this is less likely if your stomach is empty.  Ask your health care provider about changing or stopping your regular medicines. This is especially important if you are taking diabetes medicines, blood thinners, or medicines to treat erectile dysfunction. What happens during the procedure?  Hair on your chest may need to be removed so that small sticky patches called electrodes can be placed on your chest. These will transmit information that helps to monitor your heart during the test.  An IV tube will be inserted into one of your veins.  You might be given a medicine to control your heart rate during the test. This will help to ensure that good images are obtained.  You will be asked to lie on an exam table. This table will slide in and out of the CT machine during the procedure.  Contrast dye will be injected into the IV tube. You might feel warm, or you may get a metallic taste in your mouth.  You will be given a medicine (nitroglycerin) to relax (dilate) the arteries in your heart.  The table that you are lying on will move into the CT machine tunnel for the scan.  The person running the machine will give you instructions while the scans are being done. You may be asked to: ?  Keep your arms above your head. ? Hold your breath. ? Stay very still, even if the table is moving.  When the scanning is complete, you will be moved out of the machine.  The IV tube  will be removed. The procedure may vary among health care providers and hospitals. What happens after the procedure?  You might feel warm, or you may get a metallic taste in your mouth from the contrast dye.  You may have a headache from the nitroglycerin.  After the procedure, drink water or other fluids to wash (flush) the contrast material out of your body.  Contact a health care provider if you have any symptoms of allergy to the contrast. These symptoms include: ? Shortness of breath. ? Rash or hives. ? A racing heartbeat.  Most people can return to their normal activities right after the procedure. Ask your health care provider what activities are safe for you.  It is up to you to get the results of your procedure. Ask your health care provider, or the department that is doing the procedure, when your results will be ready. Summary  A cardiac CT angiogram is a procedure to look at the heart and the area around the heart. It may be done to help find the cause of chest pains or other symptoms of heart disease.  During this procedure, a large X-ray machine, called a CT scanner, takes detailed pictures of the heart and the surrounding area after a dye (contrast material) has been injected into blood vessels in the area.  Ask your health care provider about changing or stopping your regular medicines before the procedure. This is especially important if you are taking diabetes medicines, blood thinners, or medicines to treat erectile dysfunction.  After the procedure, drink water or other fluids to wash (flush) the contrast material out of your body. This information is not intended to replace advice given to you by your health care provider. Make sure you discuss any questions you have with your health  care provider. Document Released: 01/02/2008 Document Revised: 01/01/2017 Document Reviewed: 12/09/2015 Elsevier Patient Education  2020 [employer].       Adopting a Healthy Lifestyle.  Know what a healthy weight is for you (roughly BMI <25) and aim to maintain this   Aim for 7+ servings of fruits and vegetables daily   65-80+ fluid ounces of water or unsweet tea for healthy kidneys   Limit to max 1 drink of alcohol per day; avoid smoking/tobacco   Limit animal fats in diet for cholesterol and heart health - choose grass fed whenever available   Avoid highly processed foods, and foods high in saturated/trans fats   Aim for low stress - take time to unwind and care for your mental health   Aim for 150 min of moderate intensity exercise weekly for heart health, and weights twice weekly for bone health   Aim for 7-9 hours of sleep daily   When it comes to diets, agreement about the perfect plan isnt easy to find, even among the experts. Experts at the [employer] developed an idea known as the Healthy Eating Plate. Just imagine a plate divided into logical, healthy portions.   The emphasis is on diet quality:   Load up on vegetables and fruits - one-half of your plate: Aim for color and variety, and remember that potatoes dont count.   Go for whole grains - one-quarter of your plate: Whole wheat, barley, wheat berries, quinoa, oats, brown rice, and foods made with them. If you want pasta, go with whole wheat pasta.   Protein power - one-quarter of your plate: Fish, chicken, beans, and nuts are all healthy, versatile protein sources. Limit red  meat.   The diet, however, does go beyond the plate, offering a few other suggestions.   Use healthy plant oils, such as olive, canola, soy, corn, sunflower and peanut. Check the labels, and avoid partially hydrogenated oil, which have unhealthy trans fats.   If youre thirsty, drink water. Coffee and tea are good  in moderation, but skip sugary drinks and limit milk and dairy products to one or two daily servings.   The type of carbohydrate in the diet is more important than the amount. Some sources of carbohydrates, such as vegetables, fruits, whole grains, and beans-are healthier than others.   Finally, stay active  Signed, Berniece Salines, DO  01/10/2019 2:01 PM    [place]

## 2019-01-10 NOTE — Patient Instructions (Signed)
Medication Instructions:  Your physician has recommended you make the following change in your medication:   INCREASE: Coreg 25 mg Take 1 tab twice daily( May take 2 tab of 12.5 mg twice daily until you run out)  *If you need a refill on your cardiac medications before your next appointment, please call your pharmacy*  Lab Work: None If you have labs (blood work) drawn today and your tests are completely normal, you will receive your results only by: Marland Kitchen MyChart Message (if you have MyChart) OR . A paper copy in the mail If you have any lab test that is abnormal or we need to change your treatment, we will call you to review the results.  Testing/Procedures: Your physician has requested that you have cardiac CT. Cardiac computed tomography (CT) is a painless test that uses an x-ray machine to take clear, detailed pictures of your heart. For further information please visit [email or web address]. Please follow instruction sheet as given.  Your cardiac CT will be scheduled at one of the below locations:   [facility] [address] [place], [place] [address] [phone]  If scheduled at [facility], please arrive at the [facility] main entrance of [facility] 30-45 minutes prior to test start time. Proceed to the [facility] Radiology Department (first floor) to check-in and test prep.  Please follow these instructions carefully (unless otherwise directed):  On the Night Before the Test: . Be sure to Drink plenty of water. . Do not consume any caffeinated/decaffeinated beverages or chocolate 12 hours prior to your test. . Do not take any antihistamines 12 hours prior to your test.  On the Day of the Test: . Drink plenty of water. Do not drink any water within one hour of the test. . Do not eat any food 4 hours prior to the test. . You may take your regular medications prior to the test.  . Take carvedilol (Coreg) two hours prior to test. . HOLD  Furosemide morning of the test. . FEMALES- please wear underwire-free bra if available                  -If HR is less than 55 BPM- No Beta Blocker(Cavedilol)                -IF HR is greater than 55 BPM and patient is less than or equal to 44 yrs old Coreg(carvedilol) x1.           After the Test: . Drink plenty of water. . After receiving IV contrast, you may experience a mild flushed feeling. This is normal. . On occasion, you may experience a mild rash up to 24 hours after the test. This is not dangerous. If this occurs, you can take Benadryl 25 mg and increase your fluid intake. . If you experience trouble breathing, this can be serious. If it is severe call 911 IMMEDIATELY. If it is mild, please call our office.  Once we have confirmed authorization from your insurance company, we will call you to set up a date and time for your test.   For non-scheduling related questions, please contact the cardiac imaging nurse navigator should you have any questions/concerns: Marchia Bond, RN Navigator Cardiac Imaging [facility] [phone] Office    Follow-Up: At [facility], you and your health needs are our priority.  As part of our continuing mission to provide you with exceptional heart care, we have created designated Provider Care  Teams.  These Care Teams include your primary Cardiologist (physician) and Advanced Practice Providers (APPs -  Physician Assistants and Nurse Practitioners) who all work together to provide you with the care you need, when you need it.  Your next appointment:    Keep Dr Percival Spanish appointment  The format for your next appointment:   In Person  Provider:   Minus Breeding, MD  Other Instructions  Cardiac CT Angiogram  A cardiac CT angiogram is a procedure to look at the heart and the area around the heart. It may be done to help find the cause of chest pains or other symptoms of heart disease. During this procedure, a large  X-ray machine, called a CT scanner, takes detailed pictures of the heart and the surrounding area after a dye (contrast material) has been injected into blood vessels in the area. The procedure is also sometimes called a coronary CT angiogram, coronary artery scanning, or CTA. A cardiac CT angiogram allows the health care provider to see how well blood is flowing to and from the heart. The health care provider will be able to see if there are any problems, such as:  Blockage or narrowing of the coronary arteries in the heart.  Fluid around the heart.  Signs of weakness or disease in the muscles, valves, and tissues of the heart. Tell a health care provider about:  Any allergies you have. This is especially important if you have had a previous allergic reaction to contrast dye.  All medicines you are taking, including vitamins, herbs, eye drops, creams, and over-the-counter medicines.  Any blood disorders you have.  Any surgeries you have had.  Any medical conditions you have.  Whether you are pregnant or may be pregnant.  Any anxiety disorders, chronic pain, or other conditions you have that may increase your stress or prevent you from lying still. What are the risks? Generally, this is a safe procedure. However, problems may occur, including:  Bleeding.  Infection.  Allergic reactions to medicines or dyes.  Damage to other structures or organs.  Kidney damage from the dye or contrast that is used.  Increased risk of cancer from radiation exposure. This risk is low. Talk with your health care provider about: ? The risks and benefits of testing. ? How you can receive the lowest dose of radiation. What happens before the procedure?  Wear comfortable clothing and remove any jewelry, glasses, dentures, and hearing aids.  Follow instructions from your health care provider about eating and drinking. This may include: ? For 12 hours before the test - avoid caffeine. This includes  tea, coffee, soda, energy drinks, and diet pills. Drink plenty of water or other fluids that do not have caffeine in them. Being well-hydrated can prevent complications. ? For 4-6 hours before the test - stop eating and drinking. The contrast dye can cause nausea, but this is less likely if your stomach is empty.  Ask your health care provider about changing or stopping your regular medicines. This is especially important if you are taking diabetes medicines, blood thinners, or medicines to treat erectile dysfunction. What happens during the procedure?  Hair on your chest may need to be removed so that small sticky patches called electrodes can be placed on your chest. These will transmit information that helps to monitor your heart during the test.  An IV tube will be inserted into one of your veins.  You might be given a medicine to control your heart rate during the test.  This will help to ensure that good images are obtained.  You will be asked to lie on an exam table. This table will slide in and out of the CT machine during the procedure.  Contrast dye will be injected into the IV tube. You might feel warm, or you may get a metallic taste in your mouth.  You will be given a medicine (nitroglycerin) to relax (dilate) the arteries in your heart.  The table that you are lying on will move into the CT machine tunnel for the scan.  The person running the machine will give you instructions while the scans are being done. You may be asked to: ? Keep your arms above your head. ? Hold your breath. ? Stay very still, even if the table is moving.  When the scanning is complete, you will be moved out of the machine.  The IV tube will be removed. The procedure may vary among health care providers and hospitals. What happens after the procedure?  You might feel warm, or you may get a metallic taste in your mouth from the contrast dye.  You may have a headache from the nitroglycerin.  After  the procedure, drink water or other fluids to wash (flush) the contrast material out of your body.  Contact a health care provider if you have any symptoms of allergy to the contrast. These symptoms include: ? Shortness of breath. ? Rash or hives. ? A racing heartbeat.  Most people can return to their normal activities right after the procedure. Ask your health care provider what activities are safe for you.  It is up to you to get the results of your procedure. Ask your health care provider, or the department that is doing the procedure, when your results will be ready. Summary  A cardiac CT angiogram is a procedure to look at the heart and the area around the heart. It may be done to help find the cause of chest pains or other symptoms of heart disease.  During this procedure, a large X-ray machine, called a CT scanner, takes detailed pictures of the heart and the surrounding area after a dye (contrast material) has been injected into blood vessels in the area.  Ask your health care provider about changing or stopping your regular medicines before the procedure. This is especially important if you are taking diabetes medicines, blood thinners, or medicines to treat erectile dysfunction.  After the procedure, drink water or other fluids to wash (flush) the contrast material out of your body. This information is not intended to replace advice given to you by your health care provider. Make sure you discuss any questions you have with your health care provider. Document Released: 01/02/2008 Document Revised: 01/01/2017 Document Reviewed: 12/09/2015 Elsevier Patient Education  2020 [employer].

## 2019-01-11 ENCOUNTER — Telehealth: Payer: Self-pay | Admitting: Cardiology

## 2019-01-11 DIAGNOSIS — N2581 Secondary hyperparathyroidism of renal origin: Secondary | ICD-10-CM | POA: Diagnosis not present

## 2019-01-11 DIAGNOSIS — D509 Iron deficiency anemia, unspecified: Secondary | ICD-10-CM | POA: Diagnosis not present

## 2019-01-11 DIAGNOSIS — N186 End stage renal disease: Secondary | ICD-10-CM | POA: Diagnosis not present

## 2019-01-11 DIAGNOSIS — Z23 Encounter for immunization: Secondary | ICD-10-CM | POA: Diagnosis not present

## 2019-01-11 DIAGNOSIS — D631 Anemia in chronic kidney disease: Secondary | ICD-10-CM | POA: Diagnosis not present

## 2019-01-11 NOTE — Telephone Encounter (Signed)
See previous encounter

## 2019-01-11 NOTE — Addendum Note (Signed)
Addended by: Particia Nearing B on: 01/11/2019 11:11 AM   Modules accepted: Orders

## 2019-01-11 NOTE — Telephone Encounter (Signed)
Pt. Calling back again today requesting a call back for questions about medication

## 2019-01-11 NOTE — Telephone Encounter (Signed)
Pt mistakingly told us she was off the diltiazem CD 120 mg daily. She is off the calcitriol, She wants to be sure we knew with the increase in carvedilol to 25 mg twice daily that was done at her visit. Please advise

## 2019-01-11 NOTE — Telephone Encounter (Signed)
That is fine she can feel continue the carvedilol 25 mg twice a day.  Because she was already taking her carvedilol at 12.5 every 8 hours.

## 2019-01-13 DIAGNOSIS — N2581 Secondary hyperparathyroidism of renal origin: Secondary | ICD-10-CM | POA: Diagnosis not present

## 2019-01-13 DIAGNOSIS — N186 End stage renal disease: Secondary | ICD-10-CM | POA: Diagnosis not present

## 2019-01-13 DIAGNOSIS — Z23 Encounter for immunization: Secondary | ICD-10-CM | POA: Diagnosis not present

## 2019-01-13 DIAGNOSIS — D631 Anemia in chronic kidney disease: Secondary | ICD-10-CM | POA: Diagnosis not present

## 2019-01-13 DIAGNOSIS — D509 Iron deficiency anemia, unspecified: Secondary | ICD-10-CM | POA: Diagnosis not present

## 2019-01-16 DIAGNOSIS — N186 End stage renal disease: Secondary | ICD-10-CM | POA: Diagnosis not present

## 2019-01-16 DIAGNOSIS — Z23 Encounter for immunization: Secondary | ICD-10-CM | POA: Diagnosis not present

## 2019-01-16 DIAGNOSIS — Z7901 Long term (current) use of anticoagulants: Secondary | ICD-10-CM | POA: Diagnosis not present

## 2019-01-16 DIAGNOSIS — D631 Anemia in chronic kidney disease: Secondary | ICD-10-CM | POA: Diagnosis not present

## 2019-01-16 DIAGNOSIS — D509 Iron deficiency anemia, unspecified: Secondary | ICD-10-CM | POA: Diagnosis not present

## 2019-01-16 DIAGNOSIS — N2581 Secondary hyperparathyroidism of renal origin: Secondary | ICD-10-CM | POA: Diagnosis not present

## 2019-01-17 DIAGNOSIS — I770 Arteriovenous fistula, acquired: Secondary | ICD-10-CM | POA: Diagnosis not present

## 2019-01-17 DIAGNOSIS — N186 End stage renal disease: Secondary | ICD-10-CM | POA: Diagnosis not present

## 2019-01-18 DIAGNOSIS — N186 End stage renal disease: Secondary | ICD-10-CM | POA: Diagnosis not present

## 2019-01-18 DIAGNOSIS — D631 Anemia in chronic kidney disease: Secondary | ICD-10-CM | POA: Diagnosis not present

## 2019-01-18 DIAGNOSIS — D509 Iron deficiency anemia, unspecified: Secondary | ICD-10-CM | POA: Diagnosis not present

## 2019-01-18 DIAGNOSIS — N2581 Secondary hyperparathyroidism of renal origin: Secondary | ICD-10-CM | POA: Diagnosis not present

## 2019-01-18 DIAGNOSIS — Z23 Encounter for immunization: Secondary | ICD-10-CM | POA: Diagnosis not present

## 2019-01-20 DIAGNOSIS — D509 Iron deficiency anemia, unspecified: Secondary | ICD-10-CM | POA: Diagnosis not present

## 2019-01-20 DIAGNOSIS — N186 End stage renal disease: Secondary | ICD-10-CM | POA: Diagnosis not present

## 2019-01-20 DIAGNOSIS — N2581 Secondary hyperparathyroidism of renal origin: Secondary | ICD-10-CM | POA: Diagnosis not present

## 2019-01-20 DIAGNOSIS — Z23 Encounter for immunization: Secondary | ICD-10-CM | POA: Diagnosis not present

## 2019-01-20 DIAGNOSIS — D631 Anemia in chronic kidney disease: Secondary | ICD-10-CM | POA: Diagnosis not present

## 2019-01-23 DIAGNOSIS — N186 End stage renal disease: Secondary | ICD-10-CM | POA: Diagnosis not present

## 2019-01-23 DIAGNOSIS — Z23 Encounter for immunization: Secondary | ICD-10-CM | POA: Diagnosis not present

## 2019-01-23 DIAGNOSIS — D631 Anemia in chronic kidney disease: Secondary | ICD-10-CM | POA: Diagnosis not present

## 2019-01-23 DIAGNOSIS — N2581 Secondary hyperparathyroidism of renal origin: Secondary | ICD-10-CM | POA: Diagnosis not present

## 2019-01-23 DIAGNOSIS — D509 Iron deficiency anemia, unspecified: Secondary | ICD-10-CM | POA: Diagnosis not present

## 2019-01-25 DIAGNOSIS — D509 Iron deficiency anemia, unspecified: Secondary | ICD-10-CM | POA: Diagnosis not present

## 2019-01-25 DIAGNOSIS — Z23 Encounter for immunization: Secondary | ICD-10-CM | POA: Diagnosis not present

## 2019-01-25 DIAGNOSIS — Z7901 Long term (current) use of anticoagulants: Secondary | ICD-10-CM | POA: Diagnosis not present

## 2019-01-25 DIAGNOSIS — N186 End stage renal disease: Secondary | ICD-10-CM | POA: Diagnosis not present

## 2019-01-25 DIAGNOSIS — N2581 Secondary hyperparathyroidism of renal origin: Secondary | ICD-10-CM | POA: Diagnosis not present

## 2019-01-25 DIAGNOSIS — D631 Anemia in chronic kidney disease: Secondary | ICD-10-CM | POA: Diagnosis not present

## 2019-01-28 DIAGNOSIS — D509 Iron deficiency anemia, unspecified: Secondary | ICD-10-CM | POA: Diagnosis not present

## 2019-01-28 DIAGNOSIS — N186 End stage renal disease: Secondary | ICD-10-CM | POA: Diagnosis not present

## 2019-01-28 DIAGNOSIS — Z23 Encounter for immunization: Secondary | ICD-10-CM | POA: Diagnosis not present

## 2019-01-28 DIAGNOSIS — D631 Anemia in chronic kidney disease: Secondary | ICD-10-CM | POA: Diagnosis not present

## 2019-01-28 DIAGNOSIS — N2581 Secondary hyperparathyroidism of renal origin: Secondary | ICD-10-CM | POA: Diagnosis not present

## 2019-01-30 DIAGNOSIS — Z23 Encounter for immunization: Secondary | ICD-10-CM | POA: Diagnosis not present

## 2019-01-30 DIAGNOSIS — N186 End stage renal disease: Secondary | ICD-10-CM | POA: Diagnosis not present

## 2019-01-30 DIAGNOSIS — N2581 Secondary hyperparathyroidism of renal origin: Secondary | ICD-10-CM | POA: Diagnosis not present

## 2019-01-30 DIAGNOSIS — D509 Iron deficiency anemia, unspecified: Secondary | ICD-10-CM | POA: Diagnosis not present

## 2019-01-30 DIAGNOSIS — Z7901 Long term (current) use of anticoagulants: Secondary | ICD-10-CM | POA: Diagnosis not present

## 2019-01-30 DIAGNOSIS — D631 Anemia in chronic kidney disease: Secondary | ICD-10-CM | POA: Diagnosis not present

## 2019-02-01 DIAGNOSIS — N186 End stage renal disease: Secondary | ICD-10-CM | POA: Diagnosis not present

## 2019-02-01 DIAGNOSIS — D509 Iron deficiency anemia, unspecified: Secondary | ICD-10-CM | POA: Diagnosis not present

## 2019-02-01 DIAGNOSIS — Z23 Encounter for immunization: Secondary | ICD-10-CM | POA: Diagnosis not present

## 2019-02-01 DIAGNOSIS — D631 Anemia in chronic kidney disease: Secondary | ICD-10-CM | POA: Diagnosis not present

## 2019-02-01 DIAGNOSIS — N2581 Secondary hyperparathyroidism of renal origin: Secondary | ICD-10-CM | POA: Diagnosis not present

## 2019-02-02 DIAGNOSIS — N186 End stage renal disease: Secondary | ICD-10-CM | POA: Diagnosis not present

## 2019-02-02 DIAGNOSIS — Z992 Dependence on renal dialysis: Secondary | ICD-10-CM | POA: Diagnosis not present

## 2019-02-04 DIAGNOSIS — D631 Anemia in chronic kidney disease: Secondary | ICD-10-CM | POA: Diagnosis not present

## 2019-02-04 DIAGNOSIS — N186 End stage renal disease: Secondary | ICD-10-CM | POA: Diagnosis not present

## 2019-02-04 DIAGNOSIS — N2581 Secondary hyperparathyroidism of renal origin: Secondary | ICD-10-CM | POA: Diagnosis not present

## 2019-02-04 DIAGNOSIS — Z23 Encounter for immunization: Secondary | ICD-10-CM | POA: Diagnosis not present

## 2019-02-04 DIAGNOSIS — D509 Iron deficiency anemia, unspecified: Secondary | ICD-10-CM | POA: Diagnosis not present

## 2019-02-06 DIAGNOSIS — D631 Anemia in chronic kidney disease: Secondary | ICD-10-CM | POA: Diagnosis not present

## 2019-02-06 DIAGNOSIS — Z7901 Long term (current) use of anticoagulants: Secondary | ICD-10-CM | POA: Diagnosis not present

## 2019-02-06 DIAGNOSIS — N186 End stage renal disease: Secondary | ICD-10-CM | POA: Diagnosis not present

## 2019-02-06 DIAGNOSIS — N2581 Secondary hyperparathyroidism of renal origin: Secondary | ICD-10-CM | POA: Diagnosis not present

## 2019-02-06 DIAGNOSIS — D509 Iron deficiency anemia, unspecified: Secondary | ICD-10-CM | POA: Diagnosis not present

## 2019-02-06 DIAGNOSIS — Z23 Encounter for immunization: Secondary | ICD-10-CM | POA: Diagnosis not present

## 2019-02-08 DIAGNOSIS — D631 Anemia in chronic kidney disease: Secondary | ICD-10-CM | POA: Diagnosis not present

## 2019-02-08 DIAGNOSIS — Z23 Encounter for immunization: Secondary | ICD-10-CM | POA: Diagnosis not present

## 2019-02-08 DIAGNOSIS — N186 End stage renal disease: Secondary | ICD-10-CM | POA: Diagnosis not present

## 2019-02-08 DIAGNOSIS — D509 Iron deficiency anemia, unspecified: Secondary | ICD-10-CM | POA: Diagnosis not present

## 2019-02-08 DIAGNOSIS — N2581 Secondary hyperparathyroidism of renal origin: Secondary | ICD-10-CM | POA: Diagnosis not present

## 2019-02-09 DIAGNOSIS — I10 Essential (primary) hypertension: Secondary | ICD-10-CM | POA: Diagnosis not present

## 2019-02-10 DIAGNOSIS — N2581 Secondary hyperparathyroidism of renal origin: Secondary | ICD-10-CM | POA: Diagnosis not present

## 2019-02-10 DIAGNOSIS — Z23 Encounter for immunization: Secondary | ICD-10-CM | POA: Diagnosis not present

## 2019-02-10 DIAGNOSIS — D631 Anemia in chronic kidney disease: Secondary | ICD-10-CM | POA: Diagnosis not present

## 2019-02-10 DIAGNOSIS — D509 Iron deficiency anemia, unspecified: Secondary | ICD-10-CM | POA: Diagnosis not present

## 2019-02-10 DIAGNOSIS — N186 End stage renal disease: Secondary | ICD-10-CM | POA: Diagnosis not present

## 2019-02-13 DIAGNOSIS — D631 Anemia in chronic kidney disease: Secondary | ICD-10-CM | POA: Diagnosis not present

## 2019-02-13 DIAGNOSIS — Z23 Encounter for immunization: Secondary | ICD-10-CM | POA: Diagnosis not present

## 2019-02-13 DIAGNOSIS — N2581 Secondary hyperparathyroidism of renal origin: Secondary | ICD-10-CM | POA: Diagnosis not present

## 2019-02-13 DIAGNOSIS — Z7901 Long term (current) use of anticoagulants: Secondary | ICD-10-CM | POA: Diagnosis not present

## 2019-02-13 DIAGNOSIS — D509 Iron deficiency anemia, unspecified: Secondary | ICD-10-CM | POA: Diagnosis not present

## 2019-02-13 DIAGNOSIS — N186 End stage renal disease: Secondary | ICD-10-CM | POA: Diagnosis not present

## 2019-02-15 DIAGNOSIS — D631 Anemia in chronic kidney disease: Secondary | ICD-10-CM | POA: Diagnosis not present

## 2019-02-15 DIAGNOSIS — Z23 Encounter for immunization: Secondary | ICD-10-CM | POA: Diagnosis not present

## 2019-02-15 DIAGNOSIS — N186 End stage renal disease: Secondary | ICD-10-CM | POA: Diagnosis not present

## 2019-02-15 DIAGNOSIS — N2581 Secondary hyperparathyroidism of renal origin: Secondary | ICD-10-CM | POA: Diagnosis not present

## 2019-02-15 DIAGNOSIS — D509 Iron deficiency anemia, unspecified: Secondary | ICD-10-CM | POA: Diagnosis not present

## 2019-02-17 DIAGNOSIS — D631 Anemia in chronic kidney disease: Secondary | ICD-10-CM | POA: Diagnosis not present

## 2019-02-17 DIAGNOSIS — Z23 Encounter for immunization: Secondary | ICD-10-CM | POA: Diagnosis not present

## 2019-02-17 DIAGNOSIS — N186 End stage renal disease: Secondary | ICD-10-CM | POA: Diagnosis not present

## 2019-02-17 DIAGNOSIS — N2581 Secondary hyperparathyroidism of renal origin: Secondary | ICD-10-CM | POA: Diagnosis not present

## 2019-02-17 DIAGNOSIS — D509 Iron deficiency anemia, unspecified: Secondary | ICD-10-CM | POA: Diagnosis not present

## 2019-02-20 DIAGNOSIS — N2581 Secondary hyperparathyroidism of renal origin: Secondary | ICD-10-CM | POA: Diagnosis not present

## 2019-02-20 DIAGNOSIS — D631 Anemia in chronic kidney disease: Secondary | ICD-10-CM | POA: Diagnosis not present

## 2019-02-20 DIAGNOSIS — N186 End stage renal disease: Secondary | ICD-10-CM | POA: Diagnosis not present

## 2019-02-20 DIAGNOSIS — Z23 Encounter for immunization: Secondary | ICD-10-CM | POA: Diagnosis not present

## 2019-02-20 DIAGNOSIS — Z7901 Long term (current) use of anticoagulants: Secondary | ICD-10-CM | POA: Diagnosis not present

## 2019-02-20 DIAGNOSIS — D509 Iron deficiency anemia, unspecified: Secondary | ICD-10-CM | POA: Diagnosis not present

## 2019-02-22 ENCOUNTER — Other Ambulatory Visit: Payer: Self-pay

## 2019-02-22 DIAGNOSIS — N2581 Secondary hyperparathyroidism of renal origin: Secondary | ICD-10-CM | POA: Diagnosis not present

## 2019-02-22 DIAGNOSIS — D631 Anemia in chronic kidney disease: Secondary | ICD-10-CM | POA: Diagnosis not present

## 2019-02-22 DIAGNOSIS — N186 End stage renal disease: Secondary | ICD-10-CM | POA: Diagnosis not present

## 2019-02-22 DIAGNOSIS — D509 Iron deficiency anemia, unspecified: Secondary | ICD-10-CM | POA: Diagnosis not present

## 2019-02-22 DIAGNOSIS — Z23 Encounter for immunization: Secondary | ICD-10-CM | POA: Diagnosis not present

## 2019-02-23 ENCOUNTER — Telehealth: Payer: Self-pay | Admitting: Family Medicine

## 2019-02-23 ENCOUNTER — Encounter: Payer: Self-pay | Admitting: Family Medicine

## 2019-02-23 ENCOUNTER — Other Ambulatory Visit: Payer: Self-pay

## 2019-02-23 ENCOUNTER — Ambulatory Visit (INDEPENDENT_AMBULATORY_CARE_PROVIDER_SITE_OTHER): Payer: Medicare HMO | Admitting: Family Medicine

## 2019-02-23 DIAGNOSIS — N186 End stage renal disease: Secondary | ICD-10-CM | POA: Diagnosis not present

## 2019-02-23 DIAGNOSIS — E782 Mixed hyperlipidemia: Secondary | ICD-10-CM | POA: Diagnosis not present

## 2019-02-23 DIAGNOSIS — I10 Essential (primary) hypertension: Secondary | ICD-10-CM

## 2019-02-23 DIAGNOSIS — G4700 Insomnia, unspecified: Secondary | ICD-10-CM

## 2019-02-23 DIAGNOSIS — R002 Palpitations: Secondary | ICD-10-CM | POA: Diagnosis not present

## 2019-02-23 NOTE — Telephone Encounter (Signed)
[facility] faxed request for medical records to [facility] 02/23/19  [facility]

## 2019-02-23 NOTE — Patient Instructions (Addendum)
[email or web address] vaccine  Continue probiotic Melatonin 2.5 mg at bedtime Hypertension, Adult High blood pressure (hypertension) is when the force of blood pumping through the arteries is too strong. The arteries are the blood vessels that carry blood from the heart throughout the body. Hypertension forces the heart to work harder to pump blood and may cause arteries to become narrow or stiff. Untreated or uncontrolled hypertension can cause a heart attack, heart failure, a stroke, kidney disease, and other problems. A blood pressure reading consists of a higher number over a lower number. Ideally, your blood pressure should be below 120/80. The first ("top") number is called the systolic pressure. It is a measure of the pressure in your arteries as your heart beats. The second ("bottom") number is called the diastolic pressure. It is a measure of the pressure in your arteries as the heart relaxes. What are the causes? The exact cause of this condition is not known. There are some conditions that result in or are related to high blood pressure. What increases the risk? Some risk factors for high blood pressure are under your control. The following factors may make you more likely to develop this condition:  Smoking.  Having type 2 diabetes mellitus, high cholesterol, or both.  Not getting enough exercise or physical activity.  Being overweight.  Having too much fat, sugar, calories, or salt (sodium) in your diet.  Drinking too much alcohol. Some risk factors for high blood pressure may be difficult or impossible to change. Some of these factors include:  Having chronic kidney disease.  Having a family history of high blood pressure.  Age. Risk increases with age.  Race. You may be at higher risk if you are African American.  Gender. Men are at higher risk than women before age 7. After age 54, women are at higher risk than men.  Having obstructive sleep  apnea.  Stress. What are the signs or symptoms? High blood pressure may not cause symptoms. Very high blood pressure (hypertensive crisis) may cause:  Headache.  Anxiety.  Shortness of breath.  Nosebleed.  Nausea and vomiting.  Vision changes.  Severe chest pain.  Seizures. How is this diagnosed? This condition is diagnosed by measuring your blood pressure while you are seated, with your arm resting on a flat surface, your legs uncrossed, and your feet flat on the floor. The cuff of the blood pressure monitor will be placed directly against the skin of your upper arm at the level of your heart. It should be measured at least twice using the same arm. Certain conditions can cause a difference in blood pressure between your right and left arms. Certain factors can cause blood pressure readings to be lower or higher than normal for a short period of time:  When your blood pressure is higher when you are in a health care provider's office than when you are at home, this is called white coat hypertension. Most people with this condition do not need medicines.  When your blood pressure is higher at home than when you are in a health care provider's office, this is called masked hypertension. Most people with this condition may need medicines to control blood pressure. If you have a high blood pressure reading during one visit or you have normal blood pressure with other risk factors, you may be asked to:  Return on a different day to have your blood pressure checked again.  Monitor your blood pressure at home for 1 week or longer. If you  are diagnosed with hypertension, you may have other blood or imaging tests to help your health care provider understand your overall risk for other conditions. How is this treated? This condition is treated by making healthy lifestyle changes, such as eating healthy foods, exercising more, and reducing your alcohol intake. Your health care provider may  prescribe medicine if lifestyle changes are not enough to get your blood pressure under control, and if:  Your systolic blood pressure is above 130.  Your diastolic blood pressure is above 80. Your personal target blood pressure may vary depending on your medical conditions, your age, and other factors. Follow these instructions at home: Eating and drinking   Eat a diet that is high in fiber and potassium, and low in sodium, added sugar, and fat. An example eating plan is called the DASH (Dietary Approaches to Stop Hypertension) diet. To eat this way: ? Eat plenty of fresh fruits and vegetables. Try to fill one half of your plate at each meal with fruits and vegetables. ? Eat whole grains, such as whole-wheat pasta, brown rice, or whole-grain bread. Fill about one fourth of your plate with whole grains. ? Eat or drink low-fat dairy products, such as skim milk or low-fat yogurt. ? Avoid fatty cuts of meat, processed or cured meats, and poultry with skin. Fill about one fourth of your plate with lean proteins, such as fish, chicken without skin, beans, eggs, or tofu. ? Avoid pre-made and processed foods. These tend to be higher in sodium, added sugar, and fat.  Reduce your daily sodium intake. Most people with hypertension should eat less than 1,500 mg of sodium a day.  Do not drink alcohol if: ? Your health care provider tells you not to drink. ? You are pregnant, may be pregnant, or are planning to become pregnant.  If you drink alcohol: ? Limit how much you use to:  0-1 drink a day for women.  0-2 drinks a day for men. ? Be aware of how much alcohol is in your drink. In the [place] one drink equals one 12 oz bottle of beer (355 mL), one 5 oz glass of wine (148 mL), or one 1 oz glass of hard liquor (44 mL). Lifestyle   Work with your health care provider to maintain a healthy body weight or to lose weight. Ask what an ideal weight is for you.  Get at least 30 minutes of exercise  most days of the week. Activities may include walking, swimming, or biking.  Include exercise to strengthen your muscles (resistance exercise), such as Pilates or lifting weights, as part of your weekly exercise routine. Try to do these types of exercises for 30 minutes at least 3 days a week.  Do not use any products that contain nicotine or tobacco, such as cigarettes, e-cigarettes, and chewing tobacco. If you need help quitting, ask your health care provider.  Monitor your blood pressure at home as told by your health care provider.  Keep all follow-up visits as told by your health care provider. This is important. Medicines  Take over-the-counter and prescription medicines only as told by your health care provider. Follow directions carefully. Blood pressure medicines must be taken as prescribed.  Do not skip doses of blood pressure medicine. Doing this puts you at risk for problems and can make the medicine less effective.  Ask your health care provider about side effects or reactions to medicines that you should watch for. Contact a health care provider if you:  Think you are having a reaction to a medicine you are taking.  Have headaches that keep coming back (recurring).  Feel dizzy.  Have swelling in your ankles.  Have trouble with your vision. Get help right away if you:  Develop a severe headache or confusion.  Have unusual weakness or numbness.  Feel faint.  Have severe pain in your chest or abdomen.  Vomit repeatedly.  Have trouble breathing. Summary  Hypertension is when the force of blood pumping through your arteries is too strong. If this condition is not controlled, it may put you at risk for serious complications.  Your personal target blood pressure may vary depending on your medical conditions, your age, and other factors. For most people, a normal blood pressure is less than 120/80.  Hypertension is treated with lifestyle changes, medicines, or a  combination of both. Lifestyle changes include losing weight, eating a healthy, low-sodium diet, exercising more, and limiting alcohol. This information is not intended to replace advice given to you by your health care provider. Make sure you discuss any questions you have with your health care provider. Document Revised: 09/29/2017 Document Reviewed: 09/29/2017 Elsevier Patient Education  2020 [employer].

## 2019-02-23 NOTE — Progress Notes (Signed)
140 80  

## 2019-02-24 DIAGNOSIS — D509 Iron deficiency anemia, unspecified: Secondary | ICD-10-CM | POA: Diagnosis not present

## 2019-02-24 DIAGNOSIS — N186 End stage renal disease: Secondary | ICD-10-CM | POA: Diagnosis not present

## 2019-02-24 DIAGNOSIS — Z23 Encounter for immunization: Secondary | ICD-10-CM | POA: Diagnosis not present

## 2019-02-24 DIAGNOSIS — D631 Anemia in chronic kidney disease: Secondary | ICD-10-CM | POA: Diagnosis not present

## 2019-02-24 DIAGNOSIS — N2581 Secondary hyperparathyroidism of renal origin: Secondary | ICD-10-CM | POA: Diagnosis not present

## 2019-02-26 NOTE — Assessment & Plan Note (Signed)
Encouraged good sleep hygiene such as dark, quiet room. No blue/green glowing lights such as computer screens in bedroom. No alcohol or stimulants in evening. Cut down on caffeine as able. Regular exercise is helpful but not just prior to bed time.  

## 2019-02-26 NOTE — Assessment & Plan Note (Signed)
She is now having dialysis 3 x a week and notes she feels much better. She has had to have 3 fistula surgeries as the first 2 did not hold. The 3rd one is scheduled to be evaluated soon. She has a central port present for now.

## 2019-02-26 NOTE — Assessment & Plan Note (Signed)
Manageable but still occurs at times. Follows with cardiology

## 2019-02-26 NOTE — Assessment & Plan Note (Signed)
Encouraged heart healthy diet, increase exercise, avoid trans fats, consider a krill oil cap daily. Will request labs from her nephrologist before ordering repeat labs today

## 2019-02-26 NOTE — Assessment & Plan Note (Signed)
Well controlled, no changes to meds. Encouraged heart healthy diet such as the DASH diet and exercise as tolerated.  °

## 2019-02-26 NOTE — Progress Notes (Signed)
Patient ID: Jillian Hayes, female   DOB: 1945/09/19, 74 y.o.   MRN: [id number]   Subjective:    Patient ID: Jillian Hayes, female    DOB: [birth year], 74 y.o.   MRN: [id number]  Chief Complaint  Patient presents with  . Follow-up    HPI Patient is in today for follow up. She is now receiving dialysis 3 x a week and notes she actually feels much better. Less tired and achy. No recent febrile illness or hospitalizations. She is still making urine and as a result they liberalized her fluid intake some as she was experiencing some trouble with dehydration previously. Denies CP/SOB/HA/congestion/fevers/GI or GU c/o. Taking meds as prescribed  Past Medical History:  Diagnosis Date  . Advanced care planning/counseling discussion 06/10/2014   05/31/2014 patient presents copy of HCP and Living Will  . Anemia    iron deficiency  . Anxiety   . Atrial fibrillation ([place])   . Benign fundic gland polyps of stomach   . Bladder polyps 06/25/2010  . Chronic headaches 06/24/2010  . Chronic renal insufficiency, stage 4 (severe) ([place]) 11/09/2015  . Depression 1991   hospitalized  . History of chicken pox 06/25/2010  . Hypercalcemia 02/18/2014  . Hypertension   . Insomnia 06/24/2010  . Multiple chemical sensitivity syndrome 06/25/2010  . Proteinuria 02/18/2014  . Renal insufficiency 03/26/2011  . Valvular heart disease 04/28/2016    Past Surgical History:  Procedure Laterality Date  . COLONOSCOPY  2014  . cyst on left breast removed Left 1991   benign  . ESOPHAGOGASTRODUODENOSCOPY (EGD) WITH ESOPHAGEAL DILATION  2014  . NASAL SEPTUM SURGERY  1986   rhinoplasty  . polyps on bladder removed  1972   benign  . TONSILLECTOMY  1962    Family History  Problem Relation Age of Onset  . Breast cancer Mother 56       left breast removed, 2010 lung  . Diabetes Mother   . Nephrolithiasis Father   . Heart attack Father 29       X 3  . Heart disease Father        smoker  . Hypertension Brother   .  Allergic rhinitis Brother   . Melanoma Son 37       melanoma on leg removed  . Pancreatic cancer Maternal Grandmother   . Hypertension Paternal Grandmother   . Obesity Paternal Grandmother   . Heart attack Paternal Grandfather 27  . Diabetes Maternal Aunt        x 2  . Diabetes Maternal Uncle        x 3  . Asthma Neg Hx   . Eczema Neg Hx   . Urticaria Neg Hx   . Immunodeficiency Neg Hx   . Angioedema Neg Hx     Social History   Socioeconomic History  . Marital status: Married    Spouse name: Not on file  . Number of children: 2  . Years of education: Not on file  . Highest education level: Not on file  Occupational History  . Occupation: retired  Tobacco Use  . Smoking status: Never Smoker  . Smokeless tobacco: Never Used  Substance and Sexual Activity  . Alcohol use: No  . Drug use: No  . Sexual activity: Yes    Partners: Male  Other Topics Concern  . Not on file  Social History Narrative   Married and lives with husband.     Retired   1 son and 1 daughter  No alcohol tobacco or caffeine   Social Determinants of Health   Financial Resource Strain:   . Difficulty of Paying Living Expenses: Not on file  Food Insecurity:   . Worried About Charity fundraiser in the Last Year: Not on file  . Ran Out of Food in the Last Year: Not on file  Transportation Needs:   . Lack of Transportation (Medical): Not on file  . Lack of Transportation (Non-Medical): Not on file  Physical Activity:   . Days of Exercise per Week: Not on file  . Minutes of Exercise per Session: Not on file  Stress:   . Feeling of Stress : Not on file  Social Connections:   . Frequency of Communication with Friends and Family: Not on file  . Frequency of Social Gatherings with Friends and Family: Not on file  . Attends Religious Services: Not on file  . Active Member of Clubs or Organizations: Not on file  . Attends Archivist Meetings: Not on file  . Marital Status: Not on file    Intimate Partner Violence:   . Fear of Current or Ex-Partner: Not on file  . Emotionally Abused: Not on file  . Physically Abused: Not on file  . Sexually Abused: Not on file    Outpatient Medications Prior to Visit  Medication Sig Dispense Refill  . acetaminophen (TYLENOL) 500 MG tablet Take 500 mg by mouth every 6 (six) hours as needed for headache (pain).    . Alfalfa 250 MG TABS Take 1,500 mg by mouth 3 (three) times daily.    Marland Kitchen atorvastatin (LIPITOR) 40 MG tablet Take 40 mg by mouth daily.    Marland Kitchen b complex vitamins capsule Take 1 capsule by mouth 2 (two) times daily with a meal.     . carvedilol (COREG) 25 MG tablet Take 1 tablet (25 mg total) by mouth 2 (two) times daily. 180 tablet 3  . Cholecalciferol (VITAMIN D PO) Take by mouth daily.    Marland Kitchen diltiazem (CARDIZEM CD) 120 MG 24 hr capsule Take 120 mg by mouth daily.    . Flaxseed, Linseed, (FLAX SEED OIL PO) Take 1 capsule 2 (two) times daily by mouth.     . furosemide (LASIX) 20 MG tablet Take 20 mg by mouth daily as needed. Take once daily as needed for leg swelling    . hydrALAZINE (APRESOLINE) 100 MG tablet Take 1 tablet by mouth 2 (two) times daily.    . hydrocortisone (ANUSOL-HC) 2.5 % rectal cream Place rectally 2 (two) times daily as needed for hemorrhoids. 30 g 1  . IRON PO Take 15 mg by mouth daily. Shaklee product    . L-GLUTAMINE PO Take 1 capsule by mouth daily after supper.     . multivitamin (RENA-VIT) TABS tablet Take 1 tablet by mouth daily.    . Probiotic Product (PROBIOTIC DAILY PO) Take 1 tablet by mouth daily with lunch.     . sevelamer carbonate (RENVELA) 800 MG tablet Take 1,600 mg by mouth 3 (three) times daily.    . vitamin C (ASCORBIC ACID) 500 MG tablet Take 500 mg by mouth daily as needed (immune system boost).     Marland Kitchen VITAMIN E PO Take 1 capsule by mouth daily with lunch.    . warfarin (COUMADIN) 5 MG tablet TAKE 1 & 1/2 (ONE & ONE-HALF) TABLETS BY MOUTH ONCE DAILY AS DIRECTED 45 tablet 0   No  facility-administered medications prior to visit.    Allergies  Allergen Reactions  . Sulfa Antibiotics Nausea And Vomiting    Review of Systems  Constitutional: Negative for fever and malaise/fatigue.  HENT: Negative for congestion.   Eyes: Negative for blurred vision.  Respiratory: Negative for shortness of breath.   Cardiovascular: Negative for chest pain, palpitations and leg swelling.  Gastrointestinal: Negative for abdominal pain, blood in stool and nausea.  Genitourinary: Negative for dysuria and frequency.  Musculoskeletal: Negative for falls.  Skin: Negative for rash.  Neurological: Negative for dizziness, loss of consciousness and headaches.  Endo/Heme/Allergies: Negative for environmental allergies.  Psychiatric/Behavioral: Negative for depression. The patient is not nervous/anxious.        Objective:    Physical Exam Vitals and nursing note reviewed.  Constitutional:      General: She is not in acute distress.    Appearance: She is well-developed.  HENT:     Head: Normocephalic and atraumatic.     Nose: Nose normal.  Eyes:     General:        Right eye: No discharge.        Left eye: No discharge.  Cardiovascular:     Rate and Rhythm: Normal rate and regular rhythm.     Heart sounds: No murmur.  Pulmonary:     Effort: Pulmonary effort is normal.     Breath sounds: Normal breath sounds.  Abdominal:     General: Bowel sounds are normal.     Palpations: Abdomen is soft.     Tenderness: There is no abdominal tenderness.  Musculoskeletal:     Cervical back: Normal range of motion and neck supple.  Skin:    General: Skin is warm and dry.  Neurological:     Mental Status: She is alert and oriented to person, place, and time.     BP 140/80 (BP Location: Left Arm, Patient Position: Sitting, Cuff Size: Normal)   Pulse (!) 49   Temp (!) 97.4 F (36.3 C) (Temporal)   Resp 18   Wt 131 lb 6.4 oz (59.6 kg)   SpO2 98%   BMI 21.21 kg/m  Wt Readings from  Last 3 Encounters:  02/23/19 131 lb 6.4 oz (59.6 kg)  01/10/19 132 lb (59.9 kg)  11/22/18 142 lb 12.8 oz (64.8 kg)    Diabetic Foot Exam - Simple   No data filed     Lab Results  Component Value Date   WBC 5.5 09/20/2018   HGB 9.3 (L) 09/20/2018   HCT 28.2 (L) 09/20/2018   PLT 198.0 09/20/2018   GLUCOSE 93 09/20/2018   CHOL 168 09/20/2018   TRIG 83.0 09/20/2018   HDL 97.00 09/20/2018   LDLCALC 54 09/20/2018   ALT 19 09/20/2018   AST 25 09/20/2018   NA 139 09/20/2018   K 4.9 09/20/2018   CL 106 09/20/2018   CREATININE 3.82 (H) 09/20/2018   BUN 53 (H) 09/20/2018   CO2 26 09/20/2018   TSH 0.25 (L) 09/20/2018   INR 2.4 11/17/2018   HGBA1C 5.7 (H) 10/25/2017    Lab Results  Component Value Date   TSH 0.25 (L) 09/20/2018   Lab Results  Component Value Date   WBC 5.5 09/20/2018   HGB 9.3 (L) 09/20/2018   HCT 28.2 (L) 09/20/2018   MCV 89.4 09/20/2018   PLT 198.0 09/20/2018   Lab Results  Component Value Date   NA 139 09/20/2018   K 4.9 09/20/2018   CHLORIDE 109 03/25/2016   CO2 26 09/20/2018   GLUCOSE 93 09/20/2018  BUN 53 (H) 09/20/2018   CREATININE 3.82 (H) 09/20/2018   BILITOT 0.4 09/20/2018   ALKPHOS 44 09/20/2018   AST 25 09/20/2018   ALT 19 09/20/2018   PROT 6.0 09/20/2018   ALBUMIN 3.9 09/20/2018   CALCIUM 10.6 (H) 09/20/2018   ANIONGAP 7 10/27/2017   EGFR 20 (L) 03/25/2016   GFR 11.58 (LL) 09/20/2018   Lab Results  Component Value Date   CHOL 168 09/20/2018   Lab Results  Component Value Date   HDL 97.00 09/20/2018   Lab Results  Component Value Date   LDLCALC 54 09/20/2018   Lab Results  Component Value Date   TRIG 83.0 09/20/2018   Lab Results  Component Value Date   CHOLHDL 2 09/20/2018   Lab Results  Component Value Date   HGBA1C 5.7 (H) 10/25/2017       Assessment & Plan:   Problem List Items Addressed This Visit    Insomnia    Encouraged good sleep hygiene such as dark, quiet room. No blue/green glowing lights such  as computer screens in bedroom. No alcohol or stimulants in evening. Cut down on caffeine as able. Regular exercise is helpful but not just prior to bed time.       HTN (hypertension)    Well controlled, no changes to meds. Encouraged heart healthy diet such as the DASH diet and exercise as tolerated.       ESRD (end stage renal disease) ([place])    She is now having dialysis 3 x a week and notes she feels much better. She has had to have 3 fistula surgeries as the first 2 did not hold. The 3rd one is scheduled to be evaluated soon. She has a central port present for now.       Palpitation    Manageable but still occurs at times. Follows with cardiology      Hyperlipidemia    Encouraged heart healthy diet, increase exercise, avoid trans fats, consider a krill oil cap daily. Will request labs from her nephrologist before ordering repeat labs today         I am having [name] maintain her vitamin C, (Flaxseed, Linseed, (FLAX SEED OIL PO)), b complex vitamins, L-GLUTAMINE PO, Probiotic Product (PROBIOTIC DAILY PO), Alfalfa, IRON PO, VITAMIN E PO, acetaminophen, Cholecalciferol (VITAMIN D PO), sevelamer carbonate, hydrALAZINE, atorvastatin, hydrocortisone, warfarin, furosemide, multivitamin, carvedilol, and diltiazem.  No orders of the defined types were placed in this encounter.    [name], MD

## 2019-02-27 ENCOUNTER — Telehealth: Payer: Self-pay | Admitting: Cardiology

## 2019-02-27 DIAGNOSIS — D509 Iron deficiency anemia, unspecified: Secondary | ICD-10-CM | POA: Diagnosis not present

## 2019-02-27 DIAGNOSIS — D631 Anemia in chronic kidney disease: Secondary | ICD-10-CM | POA: Diagnosis not present

## 2019-02-27 DIAGNOSIS — I10 Essential (primary) hypertension: Secondary | ICD-10-CM

## 2019-02-27 DIAGNOSIS — Z7901 Long term (current) use of anticoagulants: Secondary | ICD-10-CM | POA: Diagnosis not present

## 2019-02-27 DIAGNOSIS — Z23 Encounter for immunization: Secondary | ICD-10-CM | POA: Diagnosis not present

## 2019-02-27 DIAGNOSIS — N2581 Secondary hyperparathyroidism of renal origin: Secondary | ICD-10-CM | POA: Diagnosis not present

## 2019-02-27 DIAGNOSIS — N186 End stage renal disease: Secondary | ICD-10-CM | POA: Diagnosis not present

## 2019-02-27 DIAGNOSIS — Z01812 Encounter for preprocedural laboratory examination: Secondary | ICD-10-CM

## 2019-02-27 NOTE — Telephone Encounter (Signed)
Spoke with patient. She is to have a BMP drawn 3-7 days prior to cardiac Cta. Informed patient she did not need an appointment. Order placed.

## 2019-02-27 NOTE — Telephone Encounter (Signed)
Patient is calling stating the hospital reached out to her in regards to her CT that is scheduled for 03/09/19 stating she needs labs performed a week in advanced. Please advise.

## 2019-02-28 DIAGNOSIS — H903 Sensorineural hearing loss, bilateral: Secondary | ICD-10-CM | POA: Diagnosis not present

## 2019-02-28 DIAGNOSIS — Z822 Family history of deafness and hearing loss: Secondary | ICD-10-CM | POA: Diagnosis not present

## 2019-03-01 DIAGNOSIS — N186 End stage renal disease: Secondary | ICD-10-CM | POA: Diagnosis not present

## 2019-03-01 DIAGNOSIS — N2581 Secondary hyperparathyroidism of renal origin: Secondary | ICD-10-CM | POA: Diagnosis not present

## 2019-03-01 DIAGNOSIS — Z23 Encounter for immunization: Secondary | ICD-10-CM | POA: Diagnosis not present

## 2019-03-01 DIAGNOSIS — Z1159 Encounter for screening for other viral diseases: Secondary | ICD-10-CM | POA: Diagnosis not present

## 2019-03-01 DIAGNOSIS — Z7901 Long term (current) use of anticoagulants: Secondary | ICD-10-CM | POA: Diagnosis not present

## 2019-03-01 DIAGNOSIS — D631 Anemia in chronic kidney disease: Secondary | ICD-10-CM | POA: Diagnosis not present

## 2019-03-01 DIAGNOSIS — D509 Iron deficiency anemia, unspecified: Secondary | ICD-10-CM | POA: Diagnosis not present

## 2019-03-03 DIAGNOSIS — R69 Illness, unspecified: Secondary | ICD-10-CM | POA: Diagnosis not present

## 2019-03-03 DIAGNOSIS — D509 Iron deficiency anemia, unspecified: Secondary | ICD-10-CM | POA: Diagnosis not present

## 2019-03-03 DIAGNOSIS — N186 End stage renal disease: Secondary | ICD-10-CM | POA: Diagnosis not present

## 2019-03-03 DIAGNOSIS — Z23 Encounter for immunization: Secondary | ICD-10-CM | POA: Diagnosis not present

## 2019-03-03 DIAGNOSIS — N2581 Secondary hyperparathyroidism of renal origin: Secondary | ICD-10-CM | POA: Diagnosis not present

## 2019-03-03 DIAGNOSIS — D631 Anemia in chronic kidney disease: Secondary | ICD-10-CM | POA: Diagnosis not present

## 2019-03-05 DIAGNOSIS — N186 End stage renal disease: Secondary | ICD-10-CM | POA: Diagnosis not present

## 2019-03-05 DIAGNOSIS — Z992 Dependence on renal dialysis: Secondary | ICD-10-CM | POA: Diagnosis not present

## 2019-03-06 DIAGNOSIS — Z23 Encounter for immunization: Secondary | ICD-10-CM | POA: Diagnosis not present

## 2019-03-06 DIAGNOSIS — D631 Anemia in chronic kidney disease: Secondary | ICD-10-CM | POA: Diagnosis not present

## 2019-03-06 DIAGNOSIS — Z7901 Long term (current) use of anticoagulants: Secondary | ICD-10-CM | POA: Diagnosis not present

## 2019-03-06 DIAGNOSIS — D509 Iron deficiency anemia, unspecified: Secondary | ICD-10-CM | POA: Diagnosis not present

## 2019-03-06 DIAGNOSIS — N186 End stage renal disease: Secondary | ICD-10-CM | POA: Diagnosis not present

## 2019-03-06 DIAGNOSIS — N2581 Secondary hyperparathyroidism of renal origin: Secondary | ICD-10-CM | POA: Diagnosis not present

## 2019-03-08 ENCOUNTER — Telehealth: Payer: Self-pay | Admitting: Cardiology

## 2019-03-08 NOTE — Telephone Encounter (Signed)
I called patient twice on phone #[phone] to notify her that we will be canceling her coronary CTA for now.  This is due to the fact that per current CTA protocol-she will need to be on hemodialysis for over 6 months prior to IV contrast.  I will attempt to call again to discuss other options with the patient especially if she continues to have symptoms.

## 2019-03-09 ENCOUNTER — Ambulatory Visit (HOSPITAL_COMMUNITY): Payer: Medicare HMO

## 2019-03-13 DIAGNOSIS — Z7901 Long term (current) use of anticoagulants: Secondary | ICD-10-CM | POA: Diagnosis not present

## 2019-03-14 DIAGNOSIS — Z20822 Contact with and (suspected) exposure to covid-19: Secondary | ICD-10-CM | POA: Diagnosis not present

## 2019-03-14 DIAGNOSIS — I6523 Occlusion and stenosis of bilateral carotid arteries: Secondary | ICD-10-CM | POA: Diagnosis not present

## 2019-03-14 DIAGNOSIS — N186 End stage renal disease: Secondary | ICD-10-CM | POA: Diagnosis not present

## 2019-03-14 DIAGNOSIS — Z01812 Encounter for preprocedural laboratory examination: Secondary | ICD-10-CM | POA: Diagnosis not present

## 2019-03-14 DIAGNOSIS — T82858A Stenosis of vascular prosthetic devices, implants and grafts, initial encounter: Secondary | ICD-10-CM | POA: Diagnosis not present

## 2019-03-20 DIAGNOSIS — Z7901 Long term (current) use of anticoagulants: Secondary | ICD-10-CM | POA: Diagnosis not present

## 2019-03-27 DIAGNOSIS — Z7901 Long term (current) use of anticoagulants: Secondary | ICD-10-CM | POA: Diagnosis not present

## 2019-04-02 DIAGNOSIS — Z992 Dependence on renal dialysis: Secondary | ICD-10-CM | POA: Diagnosis not present

## 2019-04-02 DIAGNOSIS — N186 End stage renal disease: Secondary | ICD-10-CM | POA: Diagnosis not present

## 2019-04-03 DIAGNOSIS — N2581 Secondary hyperparathyroidism of renal origin: Secondary | ICD-10-CM | POA: Diagnosis not present

## 2019-04-03 DIAGNOSIS — N186 End stage renal disease: Secondary | ICD-10-CM | POA: Diagnosis not present

## 2019-04-03 DIAGNOSIS — Z7901 Long term (current) use of anticoagulants: Secondary | ICD-10-CM | POA: Diagnosis not present

## 2019-04-03 DIAGNOSIS — D631 Anemia in chronic kidney disease: Secondary | ICD-10-CM | POA: Diagnosis not present

## 2019-04-03 DIAGNOSIS — Z23 Encounter for immunization: Secondary | ICD-10-CM | POA: Diagnosis not present

## 2019-04-04 DIAGNOSIS — R69 Illness, unspecified: Secondary | ICD-10-CM | POA: Diagnosis not present

## 2019-04-09 DIAGNOSIS — R609 Edema, unspecified: Secondary | ICD-10-CM | POA: Insufficient documentation

## 2019-04-09 NOTE — Progress Notes (Signed)
Cardiology Office Note   Date:  04/11/2019   ID:  Jillian Hayes, DOB 10-23-1945, MRN [id number]  PCP:  Jillian Lukes, MD  Cardiologist:   Minus Breeding, MD  Referring:  Jillian Lukes, MD  No chief complaint on file.     History of Present Illness: Jillian Hayes is a 74 y.o. female who presents for follow up of HTN.   She had an abnormal EKG and borderline POET (Plain Old Exercise Treadmill).  I sent her for a perfusion study which was negative for ischemia.  She was admitted 9/23-9/25/2019 for atrial fibrillation, RVR.  She spontaneously converted to sinus rhythm and was started on Cardizem and Coumadin.    Since she was last seen she has continued to have problems with her blood pressure.  Her nephrologist has been managing this to some degree with her hydralazine changed to 3 times a day.  Losartan was added.  She has been on dialysis now continuously and they are planning a fistula although she has a temporary catheter for now.  Her blood pressure seems to fluctuate but still in the 341P to 379K systolic.  She denies any chest pressure, neck or arm discomfort.  She has had no shortness of breath, PND or orthopnea.  She does still have some mild chronic right lower extremity edema.  Of note she did have nonsustained VT on a monitor.  There was no evidence of recurrent atrial fibrillation.  The patient has not felt like she was in atrial fibrillation.  She saw Dr. Harriet Hayes a few times and there was a plan to do a coronary CTA to evaluate the nonsustained VT.  This is on hold until her dialysis became more established.   Past Medical History:  Diagnosis Date  . Advanced care planning/counseling discussion 06/10/2014   05/31/2014 patient presents copy of HCP and Living Will  . Anemia    iron deficiency  . Anxiety   . Atrial fibrillation ([place])   . Benign fundic gland polyps of stomach   . Bladder polyps 06/25/2010  . Chronic headaches 06/24/2010  . Chronic renal insufficiency,  stage 4 (severe) ([place]) 11/09/2015  . Depression 1991   hospitalized  . History of chicken pox 06/25/2010  . Hypercalcemia 02/18/2014  . Hypertension   . Insomnia 06/24/2010  . Multiple chemical sensitivity syndrome 06/25/2010  . Proteinuria 02/18/2014  . Renal insufficiency 03/26/2011  . Valvular heart disease 04/28/2016    Past Surgical History:  Procedure Laterality Date  . COLONOSCOPY  2014  . cyst on left breast removed Left 1991   benign  . ESOPHAGOGASTRODUODENOSCOPY (EGD) WITH ESOPHAGEAL DILATION  2014  . NASAL SEPTUM SURGERY  1986   rhinoplasty  . polyps on bladder removed  1972   benign  . TONSILLECTOMY  1962     Current Outpatient Medications  Medication Sig Dispense Refill  . acetaminophen (TYLENOL) 500 MG tablet Take 500 mg by mouth every 6 (six) hours as needed for headache (pain).    . Alfalfa 250 MG TABS Take 1,500 mg by mouth 3 (three) times daily.    Marland Kitchen atorvastatin (LIPITOR) 40 MG tablet Take 40 mg by mouth daily.    Marland Kitchen b complex vitamins capsule Take 1 capsule by mouth 2 (two) times daily with a meal.     . Cholecalciferol (VITAMIN D PO) Take by mouth daily.    . cinacalcet (SENSIPAR) 30 MG tablet Take by mouth.    . diltiazem (CARDIZEM CD) 120  MG 24 hr capsule Take 120 mg by mouth daily.    . Flaxseed, Linseed, (FLAX SEED OIL PO) Take 1 capsule 2 (two) times daily by mouth.     . IRON PO Take 15 mg by mouth daily. Shaklee product    . L-GLUTAMINE PO Take 1 capsule by mouth daily after supper.     . losartan (COZAAR) 50 MG tablet     . multivitamin (RENA-VIT) TABS tablet Take 1 tablet by mouth daily.    . Probiotic Product (PROBIOTIC DAILY PO) Take 1 tablet by mouth daily with lunch.     . sevelamer carbonate (RENVELA) 800 MG tablet Take 1,600 mg by mouth 3 (three) times daily.    . vitamin C (ASCORBIC ACID) 500 MG tablet Take 500 mg by mouth daily as needed (immune system boost).     Marland Kitchen VITAMIN E PO Take 1 capsule by mouth daily with lunch.    . warfarin  (COUMADIN) 5 MG tablet TAKE 1 & 1/2 (ONE & ONE-HALF) TABLETS BY MOUTH ONCE DAILY AS DIRECTED 45 tablet 0  . carvedilol (COREG) 25 MG tablet Take 1 tablet (25 mg total) by mouth 2 (two) times daily. 180 tablet 3  . hydrALAZINE (APRESOLINE) 100 MG tablet Take 1 tablet by mouth 2 (two) times daily.     No current facility-administered medications for this visit.    Allergies:   Sulfa antibiotics    ROS:  Please see the history of present illness.   Otherwise, review of systems are positive for none.   All other systems are reviewed and negative.    PHYSICAL EXAM: VS:  BP (!) 176/66   Pulse 68   Temp 97.8 F (36.6 C)   Resp 17   Ht 5\' 6"  (1.676 m)   Wt 131 lb 12.8 oz (59.8 kg)   SpO2 97%   BMI 21.27 kg/m  , BMI Body mass index is 21.27 kg/m. GENERAL:  Well appearing NECK:  No jugular venous distention, waveform within normal limits, carotid upstroke brisk and symmetric, bilateral bruits, no thyromegaly LUNGS:  Clear to auscultation bilaterally CHEST:  Unremarkable, dialysis catheter.  HEART:  PMI not displaced or sustained,S1 and S2 within normal limits, no S3, no S4, no clicks, no rubs, no murmurs ABD:  Flat, positive bowel sounds normal in frequency in pitch, no bruits, no rebound, no guarding, no midline pulsatile mass, no hepatomegaly, no splenomegaly EXT:  2 plus pulses throughout, no edema, no cyanosis no clubbing   EKG:  EKG is  ordered today. Sinus rhythm, rate 69, premature atrial contractions, no acute ST-T wave changes.  Recent Labs: 09/20/2018: ALT 19; BUN 53; Creatinine, Ser 3.82; Hemoglobin 9.3; Platelets 198.0; Potassium 4.9; Sodium 139; TSH 0.25    Lipid Panel    Component Value Date/Time   CHOL 168 09/20/2018 1139   TRIG 83.0 09/20/2018 1139   HDL 97.00 09/20/2018 1139   CHOLHDL 2 09/20/2018 1139   VLDL 16.6 09/20/2018 1139   LDLCALC 54 09/20/2018 1139      Wt Readings from Last 3 Encounters:  04/11/19 131 lb 12.8 oz (59.8 kg)  02/23/19 131 lb 6.4 oz  (59.6 kg)  01/10/19 132 lb (59.9 kg)      Other studies Reviewed: Additional studies/ records that were reviewed today include:  None Review of the above records demonstrates:  NA   ASSESSMENT AND PLAN:  ATRIAL FIB:   [name] has a CHA2DS2 - VASc score of 3.  She tolerates  anticoagulation.  She had no symptomatic recurrence.  No change in therapy. \  HTN:  The blood pressure is still elevated.  I did increase her Cozaar to 50 mg twice daily.  I have suggested clonidine but apparently she has been intolerant of both the patch and the pill.  She will keep her blood pressure diary.   MILD/MODERATE AI:   She will have an echocardiogram tomorrow to follow-up on this.    BRUIT:   She had mild bilateral plaque two years ago on Doppler.  No change in therapy.   EDEMA:   This is mild.  No change in therapy.  COVID EDUCATION: She has had her first dose of vaccine.  Current medicines are reviewed at length with the patient today.  The patient does not have concerns regarding medicines.  The following changes have been made:  As above  Labs/ tests ordered today include:  None  No orders of the defined types were placed in this encounter.    Disposition:   FU with me in two months.     Signed, Minus Breeding, MD  04/11/2019 2:46 PM    [facility]

## 2019-04-10 DIAGNOSIS — Z7901 Long term (current) use of anticoagulants: Secondary | ICD-10-CM | POA: Diagnosis not present

## 2019-04-11 ENCOUNTER — Encounter: Payer: Self-pay | Admitting: Cardiology

## 2019-04-11 ENCOUNTER — Ambulatory Visit: Payer: Medicare HMO | Admitting: Cardiology

## 2019-04-11 ENCOUNTER — Other Ambulatory Visit: Payer: Self-pay

## 2019-04-11 VITALS — BP 176/66 | HR 68 | Temp 97.8°F | Resp 17 | Ht 66.0 in | Wt 131.8 lb

## 2019-04-11 DIAGNOSIS — I480 Paroxysmal atrial fibrillation: Secondary | ICD-10-CM | POA: Diagnosis not present

## 2019-04-11 DIAGNOSIS — R609 Edema, unspecified: Secondary | ICD-10-CM

## 2019-04-11 DIAGNOSIS — I10 Essential (primary) hypertension: Secondary | ICD-10-CM

## 2019-04-11 DIAGNOSIS — Z7189 Other specified counseling: Secondary | ICD-10-CM

## 2019-04-11 DIAGNOSIS — I351 Nonrheumatic aortic (valve) insufficiency: Secondary | ICD-10-CM

## 2019-04-11 MED ORDER — CARVEDILOL 25 MG PO TABS: 25.0000 mg | ORAL_TABLET | Freq: Two times a day (BID) | ORAL | 3 refills | Status: DC

## 2019-04-11 MED ORDER — LOSARTAN POTASSIUM 50 MG PO TABS: 50.0000 mg | ORAL_TABLET | Freq: Two times a day (BID) | ORAL | 3 refills | Status: DC

## 2019-04-11 NOTE — Patient Instructions (Signed)
Medication Instructions:  Increase Cozaar to 50mg  twice a day *If you need a refill on your cardiac medications before your next appointment, please call your pharmacy*  Lab Work: None If you have labs (blood work) drawn today and your tests are completely normal, you will receive your results only by: Marland Kitchen MyChart Message (if you have MyChart) OR . A paper copy in the mail If you have any lab test that is abnormal or we need to change your treatment, we will call you to review the results.  Testing/Procedures: None  Follow-Up: At [facility], you and your health needs are our priority.  As part of our continuing mission to provide you with exceptional heart care, we have created designated Provider Care Teams.  These Care Teams include your primary Cardiologist (physician) and Advanced Practice Providers (APPs -  Physician Assistants and Nurse Practitioners) who all work together to provide you with the care you need, when you need it.  We recommend signing up for the patient portal called "MyChart".  Sign up information is provided on this After Visit Summary.  MyChart is used to connect with patients for Virtual Visits (Telemedicine).  Patients are able to view lab/test results, encounter notes, upcoming appointments, etc.  Non-urgent messages can be sent to your provider as well.   To learn more about what you can do with MyChart, go to [email or web address].    Your next appointment:   2 month(s)  The format for your next appointment:   In Person  Provider:   Minus Breeding, MD

## 2019-04-13 ENCOUNTER — Ambulatory Visit (HOSPITAL_COMMUNITY): Payer: Medicare HMO | Attending: Internal Medicine

## 2019-04-13 ENCOUNTER — Other Ambulatory Visit: Payer: Self-pay

## 2019-04-13 DIAGNOSIS — I351 Nonrheumatic aortic (valve) insufficiency: Secondary | ICD-10-CM | POA: Insufficient documentation

## 2019-04-17 DIAGNOSIS — Z7901 Long term (current) use of anticoagulants: Secondary | ICD-10-CM | POA: Diagnosis not present

## 2019-04-24 DIAGNOSIS — Z7901 Long term (current) use of anticoagulants: Secondary | ICD-10-CM | POA: Diagnosis not present

## 2019-04-27 ENCOUNTER — Telehealth: Payer: Self-pay | Admitting: Family Medicine

## 2019-04-27 NOTE — Telephone Encounter (Signed)
Caller : Jenai Scaletta Call Back # [phone]  Patient states she needs to see a ENT, patient would like a recommendation from Dr Charlett Blake

## 2019-04-27 NOTE — Telephone Encounter (Signed)
Called patient unable to leave voicemail. Need to know what she needs the ENT for

## 2019-04-28 NOTE — Telephone Encounter (Signed)
Left message to call back.  I think the patient just would like your recommendation on an ENT.

## 2019-04-28 NOTE — Telephone Encounter (Signed)
Left detailed message on machine of name and phone number.

## 2019-04-28 NOTE — Telephone Encounter (Signed)
I would recommend Dr Radene Journey

## 2019-05-01 DIAGNOSIS — Z7901 Long term (current) use of anticoagulants: Secondary | ICD-10-CM | POA: Diagnosis not present

## 2019-05-03 DIAGNOSIS — Z992 Dependence on renal dialysis: Secondary | ICD-10-CM | POA: Diagnosis not present

## 2019-05-03 DIAGNOSIS — N186 End stage renal disease: Secondary | ICD-10-CM | POA: Diagnosis not present

## 2019-05-05 DIAGNOSIS — Z23 Encounter for immunization: Secondary | ICD-10-CM | POA: Diagnosis not present

## 2019-05-05 DIAGNOSIS — D631 Anemia in chronic kidney disease: Secondary | ICD-10-CM | POA: Diagnosis not present

## 2019-05-05 DIAGNOSIS — I10 Essential (primary) hypertension: Secondary | ICD-10-CM | POA: Diagnosis not present

## 2019-05-05 DIAGNOSIS — N2581 Secondary hyperparathyroidism of renal origin: Secondary | ICD-10-CM | POA: Diagnosis not present

## 2019-05-05 DIAGNOSIS — N186 End stage renal disease: Secondary | ICD-10-CM | POA: Diagnosis not present

## 2019-05-05 DIAGNOSIS — D509 Iron deficiency anemia, unspecified: Secondary | ICD-10-CM | POA: Diagnosis not present

## 2019-05-08 DIAGNOSIS — Z7901 Long term (current) use of anticoagulants: Secondary | ICD-10-CM | POA: Diagnosis not present

## 2019-05-10 DIAGNOSIS — I10 Essential (primary) hypertension: Secondary | ICD-10-CM | POA: Diagnosis not present

## 2019-05-11 ENCOUNTER — Ambulatory Visit (INDEPENDENT_AMBULATORY_CARE_PROVIDER_SITE_OTHER): Payer: Medicare HMO | Admitting: Otolaryngology

## 2019-05-11 ENCOUNTER — Other Ambulatory Visit: Payer: Self-pay

## 2019-05-11 ENCOUNTER — Encounter (INDEPENDENT_AMBULATORY_CARE_PROVIDER_SITE_OTHER): Payer: Self-pay | Admitting: Otolaryngology

## 2019-05-11 VITALS — Temp 97.9°F

## 2019-05-11 DIAGNOSIS — H6522 Chronic serous otitis media, left ear: Secondary | ICD-10-CM

## 2019-05-11 DIAGNOSIS — H6982 Other specified disorders of Eustachian tube, left ear: Secondary | ICD-10-CM

## 2019-05-11 NOTE — Progress Notes (Signed)
HPI: Jillian Hayes is a 74 y.o. female who presents is referred by Dr. Charlett Blake for evaluation of left serous otitis media.  Patient wears bilateral hearing aids.  She has noted blockage of her hearing in the left ear for several weeks.  She brings with her a hearing test from 04/25/2019 that demonstrated type C left tympanogram and a 10 dB conductive hearing loss in the left ear.  She denies any pain or discomfort in the ear but hears popping in her left ear when she lies down at night.  Past Medical History:  Diagnosis Date  . Advanced care planning/counseling discussion 06/10/2014   05/31/2014 patient presents copy of HCP and Living Will  . Anemia    iron deficiency  . Anxiety   . Atrial fibrillation ([place])   . Benign fundic gland polyps of stomach   . Bladder polyps 06/25/2010  . Chronic headaches 06/24/2010  . Chronic renal insufficiency, stage 4 (severe) ([place]) 11/09/2015  . Depression 1991   hospitalized  . History of chicken pox 06/25/2010  . Hypercalcemia 02/18/2014  . Hypertension   . Insomnia 06/24/2010  . Multiple chemical sensitivity syndrome 06/25/2010  . Proteinuria 02/18/2014  . Renal insufficiency 03/26/2011  . Valvular heart disease 04/28/2016   Past Surgical History:  Procedure Laterality Date  . COLONOSCOPY  2014  . cyst on left breast removed Left 1991   benign  . ESOPHAGOGASTRODUODENOSCOPY (EGD) WITH ESOPHAGEAL DILATION  2014  . NASAL SEPTUM SURGERY  1986   rhinoplasty  . polyps on bladder removed  1972   benign  . TONSILLECTOMY  1962   Social History   Socioeconomic History  . Marital status: Married    Spouse name: Not on file  . Number of children: 2  . Years of education: Not on file  . Highest education level: Not on file  Occupational History  . Occupation: retired  Tobacco Use  . Smoking status: Never Smoker  . Smokeless tobacco: Never Used  Substance and Sexual Activity  . Alcohol use: No  . Drug use: No  . Sexual activity: Yes    Partners: Male   Other Topics Concern  . Not on file  Social History Narrative   Married and lives with husband.     Retired   1 son and 1 daughter   No alcohol tobacco or caffeine   Social Determinants of Radio broadcast assistant Strain:   . Difficulty of Paying Living Expenses:   Food Insecurity:   . Worried About Charity fundraiser in the Last Year:   . Arboriculturist in the Last Year:   Transportation Needs:   . Film/video editor (Medical):   Marland Kitchen Lack of Transportation (Non-Medical):   Physical Activity:   . Days of Exercise per Week:   . Minutes of Exercise per Session:   Stress:   . Feeling of Stress :   Social Connections:   . Frequency of Communication with Friends and Family:   . Frequency of Social Gatherings with Friends and Family:   . Attends Religious Services:   . Active Member of Clubs or Organizations:   . Attends Archivist Meetings:   Marland Kitchen Marital Status:    Family History  Problem Relation Age of Onset  . Breast cancer Mother 47       left breast removed, 2010 lung  . Diabetes Mother   . Nephrolithiasis Father   . Heart attack Father 69  X 3  . Heart disease Father        smoker  . Hypertension Brother   . Allergic rhinitis Brother   . Melanoma Son 37       melanoma on leg removed  . Pancreatic cancer Maternal Grandmother   . Hypertension Paternal Grandmother   . Obesity Paternal Grandmother   . Heart attack Paternal Grandfather 62  . Diabetes Maternal Aunt        x 2  . Diabetes Maternal Uncle        x 3  . Asthma Neg Hx   . Eczema Neg Hx   . Urticaria Neg Hx   . Immunodeficiency Neg Hx   . Angioedema Neg Hx    Allergies  Allergen Reactions  . Sulfa Antibiotics Nausea And Vomiting  . Clonidine Derivatives    Prior to Admission medications   Medication Sig Start Date End Date Taking? Authorizing Provider  acetaminophen (TYLENOL) 500 MG tablet Take 500 mg by mouth every 6 (six) hours as needed for headache (pain).   Yes  [provider]  Alfalfa 250 MG TABS Take 1,500 mg by mouth 3 (three) times daily.   Yes [provider]  atorvastatin (LIPITOR) 40 MG tablet Take 40 mg by mouth daily. 09/09/18  Yes [provider]  b complex vitamins capsule Take 1 capsule by mouth 2 (two) times daily with a meal.    Yes [provider]  carvedilol (COREG) 25 MG tablet Take 1 tablet (25 mg total) by mouth 2 (two) times daily. 04/11/19 07/10/19 Yes Minus Breeding, MD  Cholecalciferol (VITAMIN D PO) Take by mouth daily.   Yes [provider]  cinacalcet (SENSIPAR) 30 MG tablet Take by mouth.   Yes [provider]  diltiazem (CARDIZEM CD) 120 MG 24 hr capsule Take 120 mg by mouth daily.   Yes [provider]  Flaxseed, Linseed, (FLAX SEED OIL PO) Take 1 capsule 2 (two) times daily by mouth.    Yes [provider]  IRON PO Take 15 mg by mouth daily. Shaklee product   Yes [provider]  L-GLUTAMINE PO Take 1 capsule by mouth daily after supper.    Yes [provider]  losartan (COZAAR) 50 MG tablet Take 1 tablet (50 mg total) by mouth in the morning and at bedtime. 04/11/19  Yes Minus Breeding, MD  multivitamin (RENA-VIT) TABS tablet Take 1 tablet by mouth daily.   Yes [provider]  Probiotic Product (PROBIOTIC DAILY PO) Take 1 tablet by mouth daily with lunch.    Yes [provider]  sevelamer carbonate (RENVELA) 800 MG tablet Take 1,600 mg by mouth 3 (three) times daily. 09/07/18  Yes [provider]  vitamin C (ASCORBIC ACID) 500 MG tablet Take 500 mg by mouth daily as needed (immune system boost).    Yes [provider]  VITAMIN E PO Take 1 capsule by mouth daily with lunch.   Yes [provider]  warfarin (COUMADIN) 5 MG tablet TAKE 1 & 1/2 (ONE & ONE-HALF) TABLETS BY MOUTH ONCE DAILY AS DIRECTED 12/14/18  Yes Minus Breeding, MD  hydrALAZINE (APRESOLINE) 100 MG tablet Take 1 tablet by mouth 2 (two)  times daily. 09/06/18 01/10/19  [provider]     Positive ROS: Otherwise negative  All other systems have been reviewed and were otherwise negative with the exception of those mentioned in the HPI and as above.  Physical Exam: Constitutional: Alert, well-appearing, no acute distress  Ears: External ears without lesions or tenderness.  Ear canals are clear bilaterally.  Right TM is clear.  Left TM reveals a serous otitis media. Procedure: I performed a myringotomy on the left side in the office today and aspirated a serous effusion from the left middle ear space.  She reported improved hearing following the myringotomy. Nasal: External nose without lesions. Septum deviated to the left.  Mild rhinitis.  Both middle meatus regions are clear with no signs of active infection.Marland Kitchen  Posterior nasal cavity is clear on the left side.  I was able to insufflate air via the left myringotomy without difficulty. Oral: Lips and gums without lesions. Tongue and palate mucosa without lesions. Posterior oropharynx clear. Neck: No palpable adenopathy or masses Respiratory: Breathing comfortably  Skin: No facial/neck lesions or rash noted.  Myringotomy  Date/Time: 05/11/2019 4:34 PM Performed by: Rozetta Nunnery, MD Authorized by: Rozetta Nunnery, MD   Consent:    Consent obtained:  Verbal   Consent given by:  Patient   Risks discussed:  Bleeding and pain   Alternatives discussed:  No treatment Pre-procedure details:    Indications: serous otitis media   Anesthesia:    Anesthesia method:  Topical application   Topical anesthetic:  Phenol Procedure Details:    Location:  Left TM   Hole made in:  Anteroinferior aspect of TM Findings:    Fluid:  Serous fluid Post-procedure details:    Patient tolerance of procedure:  Tolerated well, no immediate complications Comments:     Left simple myringotomy was performed in the office today and aspirated a serous  effusion.    Assessment: Left serous otitis media Mild to moderate bilateral SNHL.  Plan: Myringotomy was performed in the office today. Also prescribed amoxicillin 875 mg twice daily for 1 week. Also prescribed Nasacort 2 sprays each nostril at night for the next month and gave her refills to use if she notices any recurrent problems. Discussed with her that the myringotomy site will heal within 1 week and would recommend use of the Nasacort to help improve eustachian tube function. She will follow-up if she notices any recurrent problems.   Radene Journey, MD   CC:

## 2019-05-12 ENCOUNTER — Encounter (INDEPENDENT_AMBULATORY_CARE_PROVIDER_SITE_OTHER): Payer: Self-pay

## 2019-05-15 DIAGNOSIS — Z7901 Long term (current) use of anticoagulants: Secondary | ICD-10-CM | POA: Diagnosis not present

## 2019-05-18 DIAGNOSIS — Z01812 Encounter for preprocedural laboratory examination: Secondary | ICD-10-CM | POA: Diagnosis not present

## 2019-05-18 DIAGNOSIS — N186 End stage renal disease: Secondary | ICD-10-CM | POA: Diagnosis not present

## 2019-05-18 DIAGNOSIS — Z20822 Contact with and (suspected) exposure to covid-19: Secondary | ICD-10-CM | POA: Diagnosis not present

## 2019-05-22 DIAGNOSIS — Z7901 Long term (current) use of anticoagulants: Secondary | ICD-10-CM | POA: Diagnosis not present

## 2019-05-25 DIAGNOSIS — I120 Hypertensive chronic kidney disease with stage 5 chronic kidney disease or end stage renal disease: Secondary | ICD-10-CM | POA: Diagnosis not present

## 2019-05-25 DIAGNOSIS — N186 End stage renal disease: Secondary | ICD-10-CM | POA: Diagnosis not present

## 2019-05-25 DIAGNOSIS — Z992 Dependence on renal dialysis: Secondary | ICD-10-CM | POA: Diagnosis not present

## 2019-05-29 DIAGNOSIS — Z7901 Long term (current) use of anticoagulants: Secondary | ICD-10-CM | POA: Diagnosis not present

## 2019-06-02 DIAGNOSIS — N186 End stage renal disease: Secondary | ICD-10-CM | POA: Diagnosis not present

## 2019-06-02 DIAGNOSIS — Z992 Dependence on renal dialysis: Secondary | ICD-10-CM | POA: Diagnosis not present

## 2019-06-05 DIAGNOSIS — Z23 Encounter for immunization: Secondary | ICD-10-CM | POA: Diagnosis not present

## 2019-06-05 DIAGNOSIS — D509 Iron deficiency anemia, unspecified: Secondary | ICD-10-CM | POA: Diagnosis not present

## 2019-06-05 DIAGNOSIS — Z7901 Long term (current) use of anticoagulants: Secondary | ICD-10-CM | POA: Diagnosis not present

## 2019-06-05 DIAGNOSIS — N186 End stage renal disease: Secondary | ICD-10-CM | POA: Diagnosis not present

## 2019-06-05 DIAGNOSIS — N2581 Secondary hyperparathyroidism of renal origin: Secondary | ICD-10-CM | POA: Diagnosis not present

## 2019-06-05 DIAGNOSIS — D631 Anemia in chronic kidney disease: Secondary | ICD-10-CM | POA: Diagnosis not present

## 2019-06-12 DIAGNOSIS — Z7901 Long term (current) use of anticoagulants: Secondary | ICD-10-CM | POA: Diagnosis not present

## 2019-06-12 DIAGNOSIS — I340 Nonrheumatic mitral (valve) insufficiency: Secondary | ICD-10-CM | POA: Insufficient documentation

## 2019-06-12 NOTE — Progress Notes (Addendum)
Cardiology Office Note   Date:  06/13/2019   ID:  Jillian Hayes, DOB [birth year], MRN [id number]  PCP:  Jillian Lukes, MD  Cardiologist:   Minus Breeding, MD  Referring:  Jillian Lukes, MD   Chief Complaint  Patient presents with  . Atrial Fibrillation  . Hypertension      History of Present Illness: Jillian Hayes is a 74 y.o. female who presents for follow up of HTN.   She had an abnormal EKG and borderline POET (Plain Old Exercise Treadmill).  I sent her for a perfusion study which was negative for ischemia.  She was admitted 9/23-9/25/2019 for atrial fibrillation, RVR.  She spontaneously converted to sinus rhythm and was started on Cardizem and Coumadin.    At the last visit her BP was labile.  At the last visit I changed the Cozaar to 50 mg bid.   She had a follow up echo which showed mod MR and AI.  Her BP is much better this change.  She is not seeing systolics in the 063K.  She is not having any profound diastolics that are elevated and has no low blood pressures.  She says typically after dialysis her blood pressures in the 160 systolic.  She feels better.  She is not having any palpitations.  She is having no chest pressure.  She is fatigued but she does not sleep well.   Past Medical History:  Diagnosis Date  . Advanced care planning/counseling discussion 06/10/2014   05/31/2014 patient presents copy of HCP and Living Will  . Anemia    iron deficiency  . Anxiety   . Atrial fibrillation ([place])   . Benign fundic gland polyps of stomach   . Bladder polyps 06/25/2010  . Chronic headaches 06/24/2010  . Chronic renal insufficiency, stage 4 (severe) ([place]) 11/09/2015  . Depression 1991   hospitalized  . History of chicken pox 06/25/2010  . Hypercalcemia 02/18/2014  . Hypertension   . Insomnia 06/24/2010  . Multiple chemical sensitivity syndrome 06/25/2010  . Proteinuria 02/18/2014  . Renal insufficiency 03/26/2011  . Valvular heart disease 04/28/2016    Past Surgical  History:  Procedure Laterality Date  . COLONOSCOPY  2014  . cyst on left breast removed Left 1991   benign  . ESOPHAGOGASTRODUODENOSCOPY (EGD) WITH ESOPHAGEAL DILATION  2014  . NASAL SEPTUM SURGERY  1986   rhinoplasty  . polyps on bladder removed  1972   benign  . TONSILLECTOMY  1962     Current Outpatient Medications  Medication Sig Dispense Refill  . acetaminophen (TYLENOL) 500 MG tablet Take 500 mg by mouth every 6 (six) hours as needed for headache (pain).    . Alfalfa 250 MG TABS Take 1,500 mg by mouth 3 (three) times daily.    Marland Kitchen atorvastatin (LIPITOR) 20 MG tablet Take 20 mg by mouth daily.    Marland Kitchen b complex vitamins capsule Take 1 capsule by mouth 2 (two) times daily with a meal.     . carvedilol (COREG) 25 MG tablet Take 1 tablet (25 mg total) by mouth 2 (two) times daily. 180 tablet 3  . Cholecalciferol (VITAMIN D PO) Take by mouth daily.    . cinacalcet (SENSIPAR) 30 MG tablet Take by mouth.    . diltiazem (CARDIZEM CD) 120 MG 24 hr capsule Take 120 mg by mouth daily.    . Flaxseed, Linseed, (FLAX SEED OIL PO) Take 1 capsule 2 (two) times daily by mouth.     Marland Kitchen  IRON PO Take 15 mg by mouth daily. Shaklee product    . L-GLUTAMINE PO Take 1 capsule by mouth daily after supper.     . losartan (COZAAR) 50 MG tablet Take 1 tablet (50 mg total) by mouth in the morning and at bedtime. 180 tablet 3  . multivitamin (RENA-VIT) TABS tablet Take 1 tablet by mouth daily.    . Probiotic Product (PROBIOTIC DAILY PO) Take 1 tablet by mouth daily with lunch.     . sevelamer carbonate (RENVELA) 800 MG tablet Take 1,600 mg by mouth 3 (three) times daily.    . vitamin C (ASCORBIC ACID) 500 MG tablet Take 500 mg by mouth daily as needed (immune system boost).     Marland Kitchen VITAMIN E PO Take 1 capsule by mouth daily with lunch.    . warfarin (COUMADIN) 5 MG tablet TAKE 1 & 1/2 (ONE & ONE-HALF) TABLETS BY MOUTH ONCE DAILY AS DIRECTED 45 tablet 0  . hydrALAZINE (APRESOLINE) 100 MG tablet Take 1 tablet by  mouth 3 (three) times daily.      No current facility-administered medications for this visit.    Allergies:   Sulfa antibiotics and Clonidine derivatives    ROS:  Please see the history of present illness.   Otherwise, review of systems are positive for none.   All other systems are reviewed and negative.    PHYSICAL EXAM: VS:  BP (!) 150/93   Pulse 69   Temp (!) 97.2 F (36.2 C)   Ht 5\' 7"  (1.702 m)   Wt 134 lb 12.8 oz (61.1 kg)   SpO2 97%   BMI 21.11 kg/m  , BMI Body mass index is 21.11 kg/m. GENERAL:  Well appearing NECK:  No jugular venous distention, waveform within normal limits, carotid upstroke brisk and symmetric, no bruits, no thyromegaly LUNGS:  Clear to auscultation bilaterally CHEST:  Unremarkable HEART:  PMI not displaced or sustained,S1 and S2 within normal limits, no S3, no S4, no clicks, no rubs, 2 out of 6 brief apical systolic murmur, no diastolic murmurs ABD:  Flat, positive bowel sounds normal in frequency in pitch, no bruits, no rebound, no guarding, no midline pulsatile mass, no hepatomegaly, no splenomegaly EXT:  2 plus pulses throughout, no edema, no cyanosis no clubbing   EKG:  EKG is  ordered today. Sinus rhythm, rate 69, premature atrial contractions, no acute ST-T wave changes.  Premature atrial contractions  Recent Labs: 09/20/2018: ALT 19; BUN 53; Creatinine, Ser 3.82; Hemoglobin 9.3; Platelets 198.0; Potassium 4.9; Sodium 139; TSH 0.25    Lipid Panel    Component Value Date/Time   CHOL 168 09/20/2018 1139   TRIG 83.0 09/20/2018 1139   HDL 97.00 09/20/2018 1139   CHOLHDL 2 09/20/2018 1139   VLDL 16.6 09/20/2018 1139   LDLCALC 54 09/20/2018 1139      Wt Readings from Last 3 Encounters:  06/13/19 134 lb 12.8 oz (61.1 kg)  04/11/19 131 lb 12.8 oz (59.8 kg)  02/23/19 131 lb 6.4 oz (59.6 kg)      Other studies Reviewed: Additional studies/ records that were reviewed today include:  None Review of the above records demonstrates:   NA   ASSESSMENT AND PLAN:  ATRIAL FIB:   [name] has a CHA2DS2 - VASc score of 3.  She tolerates anticoagulation.  She has not had any symptomatic paroxysms.  No change in therapy.   HTN:  The blood pressure is not perfect but better.  She has  been intolerant of multiple medications.  In particular to remind myself not to use clonidine as she has been intolerant of this and both forms patch and pill.   MILD/MODERATE AI:   This was mild to moderate on echo in March.  I will follow this clinically.  BRUIT:   She had mild bilateral plaque two years ago on Doppler.    COVID EDUCATION:    She has had both of her vaccines.   Current medicines are reviewed at length with the patient today.  The patient does not have concerns regarding medicines.  The following changes have been made:  As above  Labs/ tests ordered today include:  None  Orders Placed This Encounter  Procedures  . EKG 12-Lead     Disposition:   FU with me in two months.     Signed, Minus Breeding, MD  06/13/2019 1:43 PM    [facility]

## 2019-06-13 ENCOUNTER — Other Ambulatory Visit: Payer: Self-pay

## 2019-06-13 ENCOUNTER — Ambulatory Visit: Payer: Medicare HMO | Admitting: Cardiology

## 2019-06-13 ENCOUNTER — Encounter: Payer: Self-pay | Admitting: Cardiology

## 2019-06-13 VITALS — BP 150/93 | HR 69 | Temp 97.2°F | Ht 67.0 in | Wt 134.8 lb

## 2019-06-13 DIAGNOSIS — I351 Nonrheumatic aortic (valve) insufficiency: Secondary | ICD-10-CM | POA: Diagnosis not present

## 2019-06-13 DIAGNOSIS — I10 Essential (primary) hypertension: Secondary | ICD-10-CM

## 2019-06-13 DIAGNOSIS — I340 Nonrheumatic mitral (valve) insufficiency: Secondary | ICD-10-CM

## 2019-06-13 DIAGNOSIS — I480 Paroxysmal atrial fibrillation: Secondary | ICD-10-CM

## 2019-06-13 NOTE — Patient Instructions (Signed)
Medication Instructions:  °NO CHANGES °*If you need a refill on your cardiac medications before your next appointment, please call your pharmacy* ° °Lab Work: °NONE ORDERED THIS VISIT ° °Testing/Procedures: °NONE ORDERED THIS VISIT ° °Follow-Up: °At [facility], you and your health needs are our priority.  As part of our continuing mission to provide you with exceptional heart care, we have created designated Provider Care Teams.  These Care Teams include your primary Cardiologist (physician) and Advanced Practice Providers (APPs -  Physician Assistants and Nurse Practitioners) who all work together to provide you with the care you need, when you need it. ° °Your next appointment:   °6 month(s)  You will receive a reminder letter in the mail two months in advance. If you don't receive a letter, please call our office to schedule the follow-up appointment. ° °The format for your next appointment:   °In Person ° °Provider:   °James Hochrein, MD ° ° °

## 2019-06-19 DIAGNOSIS — Z7901 Long term (current) use of anticoagulants: Secondary | ICD-10-CM | POA: Diagnosis not present

## 2019-06-22 ENCOUNTER — Other Ambulatory Visit: Payer: Self-pay

## 2019-06-22 ENCOUNTER — Ambulatory Visit (INDEPENDENT_AMBULATORY_CARE_PROVIDER_SITE_OTHER): Payer: Medicare HMO | Admitting: Family Medicine

## 2019-06-22 DIAGNOSIS — E782 Mixed hyperlipidemia: Secondary | ICD-10-CM

## 2019-06-22 DIAGNOSIS — N186 End stage renal disease: Secondary | ICD-10-CM | POA: Diagnosis not present

## 2019-06-22 DIAGNOSIS — I480 Paroxysmal atrial fibrillation: Secondary | ICD-10-CM | POA: Diagnosis not present

## 2019-06-22 DIAGNOSIS — I10 Essential (primary) hypertension: Secondary | ICD-10-CM | POA: Diagnosis not present

## 2019-06-22 NOTE — Patient Instructions (Addendum)
Benadryl/Diphenhydramine 12.5-25 mg for itching    Magnesium Glycinate 400 mg plus Melatonin 5 mg at bedtime Insomnia Insomnia is a sleep disorder that makes it difficult to fall asleep or stay asleep. Insomnia can cause fatigue, low energy, difficulty concentrating, mood swings, and poor performance at work or school. There are three different ways to classify insomnia:  Difficulty falling asleep.  Difficulty staying asleep.  Waking up too early in the morning. Any type of insomnia can be long-term (chronic) or short-term (acute). Both are common. Short-term insomnia usually lasts for three months or less. Chronic insomnia occurs at least three times a week for longer than three months. What are the causes? Insomnia may be caused by another condition, situation, or substance, such as:  Anxiety.  Certain medicines.  Gastroesophageal reflux disease (GERD) or other gastrointestinal conditions.  Asthma or other breathing conditions.  Restless legs syndrome, sleep apnea, or other sleep disorders.  Chronic pain.  Menopause.  Stroke.  Abuse of alcohol, tobacco, or illegal drugs.  Mental health conditions, such as depression.  Caffeine.  Neurological disorders, such as Alzheimer's disease.  An overactive thyroid (hyperthyroidism). Sometimes, the cause of insomnia may not be known. What increases the risk? Risk factors for insomnia include:  Gender. Women are affected more often than men.  Age. Insomnia is more common as you get older.  Stress.  Lack of exercise.  Irregular work schedule or working night shifts.  Traveling between different time zones.  Certain medical and mental health conditions. What are the signs or symptoms? If you have insomnia, the main symptom is having trouble falling asleep or having trouble staying asleep. This may lead to other symptoms, such as:  Feeling fatigued or having low energy.  Feeling nervous about going to sleep.  Not  feeling rested in the morning.  Having trouble concentrating.  Feeling irritable, anxious, or depressed. How is this diagnosed? This condition may be diagnosed based on:  Your symptoms and medical history. Your health care provider may ask about: ? Your sleep habits. ? Any medical conditions you have. ? Your mental health.  A physical exam. How is this treated? Treatment for insomnia depends on the cause. Treatment may focus on treating an underlying condition that is causing insomnia. Treatment may also include:  Medicines to help you sleep.  Counseling or therapy.  Lifestyle adjustments to help you sleep better. Follow these instructions at home: Eating and drinking   Limit or avoid alcohol, caffeinated beverages, and cigarettes, especially close to bedtime. These can disrupt your sleep.  Do not eat a large meal or eat spicy foods right before bedtime. This can lead to digestive discomfort that can make it hard for you to sleep. Sleep habits   Keep a sleep diary to help you and your health care provider figure out what could be causing your insomnia. Write down: ? When you sleep. ? When you wake up during the night. ? How well you sleep. ? How rested you feel the next day. ? Any side effects of medicines you are taking. ? What you eat and drink.  Make your bedroom a dark, comfortable place where it is easy to fall asleep. ? Put up shades or blackout curtains to block light from outside. ? Use a white noise machine to block noise. ? Keep the temperature cool.  Limit screen use before bedtime. This includes: ? Watching TV. ? Using your smartphone, tablet, or computer.  Stick to a routine that includes going to bed and  waking up at the same times every day and night. This can help you fall asleep faster. Consider making a quiet activity, such as reading, part of your nighttime routine.  Try to avoid taking naps during the day so that you sleep better at night.  Get  out of bed if you are still awake after 15 minutes of trying to sleep. Keep the lights down, but try reading or doing a quiet activity. When you feel sleepy, go back to bed. General instructions  Take over-the-counter and prescription medicines only as told by your health care provider.  Exercise regularly, as told by your health care provider. Avoid exercise starting several hours before bedtime.  Use relaxation techniques to manage stress. Ask your health care provider to suggest some techniques that may work well for you. These may include: ? Breathing exercises. ? Routines to release muscle tension. ? Visualizing peaceful scenes.  Make sure that you drive carefully. Avoid driving if you feel very sleepy.  Keep all follow-up visits as told by your health care provider. This is important. Contact a health care provider if:  You are tired throughout the day.  You have trouble in your daily routine due to sleepiness.  You continue to have sleep problems, or your sleep problems get worse. Get help right away if:  You have serious thoughts about hurting yourself or someone else. If you ever feel like you may hurt yourself or others, or have thoughts about taking your own life, get help right away. You can go to your nearest emergency department or call:  Your local emergency services (911 in the [place]  A suicide crisis helpline, such as the [facility] at (747)089-9673. This is open 24 hours a day. Summary  Insomnia is a sleep disorder that makes it difficult to fall asleep or stay asleep.  Insomnia can be long-term (chronic) or short-term (acute).  Treatment for insomnia depends on the cause. Treatment may focus on treating an underlying condition that is causing insomnia.  Keep a sleep diary to help you and your health care provider figure out what could be causing your insomnia. This information is not intended to replace advice given to you by your  health care provider. Make sure you discuss any questions you have with your health care provider. Document Revised: 01/01/2017 Document Reviewed: 10/29/2016 Elsevier Patient Education  2020 [employer].

## 2019-06-25 NOTE — Assessment & Plan Note (Signed)
Rate controlled and tolerating treatment

## 2019-06-25 NOTE — Assessment & Plan Note (Signed)
Continues to have her dialysis through her central line as her peripheral port has been difficult to obtain. Her newest one seems to be functional and they may start using it soon

## 2019-06-25 NOTE — Progress Notes (Signed)
Subjective:    Patient ID: Jillian Hayes, female    DOB: Dec 22, 1945, 74 y.o.   MRN: [id number]  Chief Complaint  Patient presents with  . 4 month follow up    HPI Patient is in today for follow up on chronic medical concerns. No recent febrile illness or hospitalizations. Jillian Hayes have been performing dialysis but have had to use a central port as Jillian Hayes have had trouble getting a peripheral port to be functional. Jillian Hayes are on Jillian Hayes fourth attempt and the current one seems to be holding. Denies CP/palp/SOB/HA/congestion/fevers/GI or GU c/o. Taking meds as prescribed.  Past Medical History:  Diagnosis Date  . Advanced care planning/counseling discussion 06/10/2014   05/31/2014 patient presents copy of HCP and Living Will  . Anemia    iron deficiency  . Anxiety   . Atrial fibrillation ([place])   . Benign fundic gland polyps of stomach   . Bladder polyps 06/25/2010  . Chronic headaches 06/24/2010  . Chronic renal insufficiency, stage 4 (severe) ([place]) 11/09/2015  . Depression 1991   hospitalized  . History of chicken pox 06/25/2010  . Hypercalcemia 02/18/2014  . Hypertension   . Insomnia 06/24/2010  . Multiple chemical sensitivity syndrome 06/25/2010  . Proteinuria 02/18/2014  . Renal insufficiency 03/26/2011  . Valvular heart disease 04/28/2016    Past Surgical History:  Procedure Laterality Date  . COLONOSCOPY  2014  . cyst on left breast removed Left 1991   benign  . ESOPHAGOGASTRODUODENOSCOPY (EGD) WITH ESOPHAGEAL DILATION  2014  . NASAL SEPTUM SURGERY  1986   rhinoplasty  . polyps on bladder removed  1972   benign  . TONSILLECTOMY  1962    Family History  Problem Relation Age of Onset  . Breast cancer Mother 7       left breast removed, 2010 lung  . Diabetes Mother   . Nephrolithiasis Father   . Heart attack Father 72       X 3  . Heart disease Father        smoker  . Hypertension Brother   . Allergic rhinitis Brother   . Melanoma Son 37       melanoma on leg removed  .  Pancreatic cancer Maternal Grandmother   . Hypertension Paternal Grandmother   . Obesity Paternal Grandmother   . Heart attack Paternal Grandfather 33  . Diabetes Maternal Aunt        x 2  . Diabetes Maternal Uncle        x 3  . Asthma Neg Hx   . Eczema Neg Hx   . Urticaria Neg Hx   . Immunodeficiency Neg Hx   . Angioedema Neg Hx     Social History   Socioeconomic History  . Marital status: Married    Spouse name: Not on file  . Number of children: 2  . Years of education: Not on file  . Highest education level: Not on file  Occupational History  . Occupation: retired  Tobacco Use  . Smoking status: Never Smoker  . Smokeless tobacco: Never Used  Substance and Sexual Activity  . Alcohol use: No  . Drug use: No  . Sexual activity: Yes    Partners: Male  Other Topics Concern  . Not on file  Social History Narrative   Married and lives with husband.     Retired   1 son and 1 daughter   No alcohol tobacco or caffeine   Social Determinants of Health  Financial Resource Strain:   . Difficulty of Paying Living Expenses:   Food Insecurity:   . Worried About Charity fundraiser in the Last Year:   . Arboriculturist in the Last Year:   Transportation Needs:   . Film/video editor (Medical):   Marland Kitchen Lack of Transportation (Non-Medical):   Physical Activity:   . Days of Exercise per Week:   . Minutes of Exercise per Session:   Stress:   . Feeling of Stress :   Social Connections:   . Frequency of Communication with Friends and Family:   . Frequency of Social Gatherings with Friends and Family:   . Attends Religious Services:   . Active Member of Clubs or Organizations:   . Attends Archivist Meetings:   Marland Kitchen Marital Status:   Intimate Partner Violence:   . Fear of Current or Ex-Partner:   . Emotionally Abused:   Marland Kitchen Physically Abused:   . Sexually Abused:     Outpatient Medications Prior to Visit  Medication Sig Dispense Refill  . acetaminophen  (TYLENOL) 500 MG tablet Take 500 mg by mouth every 6 (six) hours as needed for headache (pain).    . Alfalfa 250 MG TABS Take 1,500 mg by mouth 3 (three) times daily.    Marland Kitchen atorvastatin (LIPITOR) 20 MG tablet Take 20 mg by mouth daily.    Marland Kitchen b complex vitamins capsule Take 1 capsule by mouth 2 (two) times daily with a meal.     . carvedilol (COREG) 25 MG tablet Take 1 tablet (25 mg total) by mouth 2 (two) times daily. 180 tablet 3  . Cholecalciferol (VITAMIN D PO) Take by mouth daily.    . cinacalcet (SENSIPAR) 30 MG tablet Take by mouth.    . diltiazem (CARDIZEM CD) 240 MG 24 hr capsule Take 240 mg by mouth daily.    . Flaxseed, Linseed, (FLAX SEED OIL PO) Take 1 capsule 2 (two) times daily by mouth.     . IRON PO Take 15 mg by mouth daily. Shaklee product    . L-GLUTAMINE PO Take 1 capsule by mouth daily after supper.     . losartan (COZAAR) 50 MG tablet Take 1 tablet (50 mg total) by mouth in the morning and at bedtime. 180 tablet 3  . multivitamin (RENA-VIT) TABS tablet Take 1 tablet by mouth daily.    . Probiotic Product (PROBIOTIC DAILY PO) Take 1 tablet by mouth daily with lunch.     . sevelamer carbonate (RENVELA) 800 MG tablet Take 1,600 mg by mouth 3 (three) times daily.    . vitamin C (ASCORBIC ACID) 500 MG tablet Take 500 mg by mouth daily as needed (immune system boost).     Marland Kitchen VITAMIN E PO Take 1 capsule by mouth daily with lunch.    . warfarin (COUMADIN) 5 MG tablet TAKE 1 & 1/2 (ONE & ONE-HALF) TABLETS BY MOUTH ONCE DAILY AS DIRECTED 45 tablet 0  . hydrALAZINE (APRESOLINE) 100 MG tablet Take 1 tablet by mouth 3 (three) times daily.     Marland Kitchen diltiazem (CARDIZEM CD) 120 MG 24 hr capsule Take 120 mg by mouth daily.     No facility-administered medications prior to visit.    Allergies  Allergen Reactions  . Sulfa Antibiotics Nausea And Vomiting  . Clonidine Derivatives     Review of Systems  Constitutional: Positive for malaise/fatigue. Negative for fever.  HENT: Negative for  congestion.   Eyes: Negative for blurred vision.  Respiratory: Negative for shortness of breath.   Cardiovascular: Negative for chest pain, palpitations and leg swelling.  Gastrointestinal: Negative for abdominal pain, blood in stool and nausea.  Genitourinary: Negative for dysuria and frequency.  Musculoskeletal: Negative for falls.  Skin: Negative for rash.  Neurological: Negative for dizziness, loss of consciousness and headaches.  Endo/Heme/Allergies: Negative for environmental allergies.  Psychiatric/Behavioral: Negative for depression. The patient is not nervous/anxious.        Objective:    Physical Exam Vitals and nursing note reviewed.  Constitutional:      General: Jillian Hayes is not in acute distress.    Appearance: Jillian Hayes is well-developed.  HENT:     Head: Normocephalic and atraumatic.     Nose: Nose normal.  Eyes:     General:        Right eye: No discharge.        Left eye: No discharge.  Cardiovascular:     Rate and Rhythm: Normal rate. Rhythm irregular.     Heart sounds: No murmur.  Pulmonary:     Effort: Pulmonary effort is normal.     Breath sounds: Normal breath sounds.  Abdominal:     General: Bowel sounds are normal.     Palpations: Abdomen is soft.     Tenderness: There is no abdominal tenderness.  Musculoskeletal:     Cervical back: Normal range of motion and neck supple.  Skin:    General: Skin is warm and dry.  Neurological:     Mental Status: Jillian Hayes is alert and oriented to person, place, and time.     BP 120/66 (BP Location: Left Arm, Cuff Size: Normal)   Pulse 92   Temp 98.3 F (36.8 C) (Temporal)   Resp 12   Ht 5' 7"  (1.702 m)   Wt 128 lb 12.8 oz (58.4 kg)   SpO2 98%   BMI 20.17 kg/m  Wt Readings from Last 3 Encounters:  06/22/19 128 lb 12.8 oz (58.4 kg)  06/13/19 134 lb 12.8 oz (61.1 kg)  04/11/19 131 lb 12.8 oz (59.8 kg)    Diabetic Foot Exam - Simple   No data filed     Lab Results  Component Value Date   WBC 5.5 09/20/2018    HGB 9.3 (L) 09/20/2018   HCT 28.2 (L) 09/20/2018   PLT 198.0 09/20/2018   GLUCOSE 93 09/20/2018   CHOL 168 09/20/2018   TRIG 83.0 09/20/2018   HDL 97.00 09/20/2018   LDLCALC 54 09/20/2018   ALT 19 09/20/2018   AST 25 09/20/2018   NA 139 09/20/2018   K 4.9 09/20/2018   CL 106 09/20/2018   CREATININE 3.82 (H) 09/20/2018   BUN 53 (H) 09/20/2018   CO2 26 09/20/2018   TSH 0.25 (L) 09/20/2018   INR 2.4 11/17/2018   HGBA1C 5.7 (H) 10/25/2017    Lab Results  Component Value Date   TSH 0.25 (L) 09/20/2018   Lab Results  Component Value Date   WBC 5.5 09/20/2018   HGB 9.3 (L) 09/20/2018   HCT 28.2 (L) 09/20/2018   MCV 89.4 09/20/2018   PLT 198.0 09/20/2018   Lab Results  Component Value Date   NA 139 09/20/2018   K 4.9 09/20/2018   CHLORIDE 109 03/25/2016   CO2 26 09/20/2018   GLUCOSE 93 09/20/2018   BUN 53 (H) 09/20/2018   CREATININE 3.82 (H) 09/20/2018   BILITOT 0.4 09/20/2018   ALKPHOS 44 09/20/2018   AST 25 09/20/2018   ALT 19 09/20/2018  PROT 6.0 09/20/2018   ALBUMIN 3.9 09/20/2018   CALCIUM 10.6 (H) 09/20/2018   ANIONGAP 7 10/27/2017   EGFR 20 (L) 03/25/2016   GFR 11.58 (LL) 09/20/2018   Lab Results  Component Value Date   CHOL 168 09/20/2018   Lab Results  Component Value Date   HDL 97.00 09/20/2018   Lab Results  Component Value Date   LDLCALC 54 09/20/2018   Lab Results  Component Value Date   TRIG 83.0 09/20/2018   Lab Results  Component Value Date   CHOLHDL 2 09/20/2018   Lab Results  Component Value Date   HGBA1C 5.7 (H) 10/25/2017       Assessment & Plan:   Problem List Items Addressed This Visit    HTN (hypertension)    Well controlled, no changes to meds. Encouraged heart healthy diet such as the DASH diet and exercise as tolerated.       Relevant Medications   diltiazem (CARDIZEM CD) 240 MG 24 hr capsule   ESRD (end stage renal disease) ([place])    Continues to have Jillian Hayes dialysis through Jillian Hayes central line as Jillian Hayes peripheral  port has been difficult to obtain. Jillian Hayes Jillian Hayes one seems to be functional and Jillian Hayes may start using it soon      Hyperlipidemia    Tolerating statin, encouraged heart healthy diet, avoid trans fats, minimize simple carbs and saturated fats. Increase exercise as tolerated      Relevant Medications   diltiazem (CARDIZEM CD) 240 MG 24 hr capsule   Atrial fibrillation ([facility])    Rate controlled and tolerating treatment      Relevant Medications   diltiazem (CARDIZEM CD) 240 MG 24 hr capsule      I am having [name] maintain Jillian Hayes vitamin C, (Flaxseed, Linseed, (FLAX SEED OIL PO)), b complex vitamins, L-GLUTAMINE PO, Probiotic Product (PROBIOTIC DAILY PO), Alfalfa, IRON PO, VITAMIN E PO, acetaminophen, Cholecalciferol (VITAMIN D PO), sevelamer carbonate, hydrALAZINE, warfarin, multivitamin, cinacalcet, carvedilol, losartan, atorvastatin, and diltiazem.  No orders of the defined types were placed in this encounter.    [name], MD

## 2019-06-25 NOTE — Assessment & Plan Note (Signed)
Well controlled, no changes to meds. Encouraged heart healthy diet such as the DASH diet and exercise as tolerated.  °

## 2019-06-25 NOTE — Assessment & Plan Note (Signed)
Tolerating statin, encouraged heart healthy diet, avoid trans fats, minimize simple carbs and saturated fats. Increase exercise as tolerated 

## 2019-06-26 DIAGNOSIS — Z7901 Long term (current) use of anticoagulants: Secondary | ICD-10-CM | POA: Diagnosis not present

## 2019-06-27 DIAGNOSIS — I770 Arteriovenous fistula, acquired: Secondary | ICD-10-CM | POA: Diagnosis not present

## 2019-06-27 DIAGNOSIS — I97638 Postprocedural hematoma of a circulatory system organ or structure following other circulatory system procedure: Secondary | ICD-10-CM | POA: Diagnosis not present

## 2019-06-27 DIAGNOSIS — N186 End stage renal disease: Secondary | ICD-10-CM | POA: Diagnosis not present

## 2019-07-03 DIAGNOSIS — N186 End stage renal disease: Secondary | ICD-10-CM | POA: Diagnosis not present

## 2019-07-03 DIAGNOSIS — Z7901 Long term (current) use of anticoagulants: Secondary | ICD-10-CM | POA: Diagnosis not present

## 2019-07-03 DIAGNOSIS — Z992 Dependence on renal dialysis: Secondary | ICD-10-CM | POA: Diagnosis not present

## 2019-07-05 DIAGNOSIS — D631 Anemia in chronic kidney disease: Secondary | ICD-10-CM | POA: Diagnosis not present

## 2019-07-05 DIAGNOSIS — D509 Iron deficiency anemia, unspecified: Secondary | ICD-10-CM | POA: Diagnosis not present

## 2019-07-05 DIAGNOSIS — N2581 Secondary hyperparathyroidism of renal origin: Secondary | ICD-10-CM | POA: Diagnosis not present

## 2019-07-05 DIAGNOSIS — N186 End stage renal disease: Secondary | ICD-10-CM | POA: Diagnosis not present

## 2019-07-05 DIAGNOSIS — Z23 Encounter for immunization: Secondary | ICD-10-CM | POA: Diagnosis not present

## 2019-07-10 DIAGNOSIS — Z7901 Long term (current) use of anticoagulants: Secondary | ICD-10-CM | POA: Diagnosis not present

## 2019-07-17 DIAGNOSIS — Z7901 Long term (current) use of anticoagulants: Secondary | ICD-10-CM | POA: Diagnosis not present

## 2019-07-24 DIAGNOSIS — Z7901 Long term (current) use of anticoagulants: Secondary | ICD-10-CM | POA: Diagnosis not present

## 2019-07-31 DIAGNOSIS — Z7901 Long term (current) use of anticoagulants: Secondary | ICD-10-CM | POA: Diagnosis not present

## 2019-08-02 DIAGNOSIS — I10 Essential (primary) hypertension: Secondary | ICD-10-CM | POA: Diagnosis not present

## 2019-08-02 DIAGNOSIS — H6505 Acute serous otitis media, recurrent, left ear: Secondary | ICD-10-CM | POA: Diagnosis not present

## 2019-08-02 DIAGNOSIS — N186 End stage renal disease: Secondary | ICD-10-CM | POA: Diagnosis not present

## 2019-08-02 DIAGNOSIS — R0981 Nasal congestion: Secondary | ICD-10-CM | POA: Diagnosis not present

## 2019-08-02 DIAGNOSIS — Z992 Dependence on renal dialysis: Secondary | ICD-10-CM | POA: Diagnosis not present

## 2019-08-03 DIAGNOSIS — T82858A Stenosis of vascular prosthetic devices, implants and grafts, initial encounter: Secondary | ICD-10-CM | POA: Diagnosis not present

## 2019-08-03 DIAGNOSIS — N186 End stage renal disease: Secondary | ICD-10-CM | POA: Diagnosis not present

## 2019-08-03 DIAGNOSIS — I770 Arteriovenous fistula, acquired: Secondary | ICD-10-CM | POA: Insufficient documentation

## 2019-08-04 DIAGNOSIS — D631 Anemia in chronic kidney disease: Secondary | ICD-10-CM | POA: Diagnosis not present

## 2019-08-04 DIAGNOSIS — D509 Iron deficiency anemia, unspecified: Secondary | ICD-10-CM | POA: Diagnosis not present

## 2019-08-04 DIAGNOSIS — N186 End stage renal disease: Secondary | ICD-10-CM | POA: Diagnosis not present

## 2019-08-04 DIAGNOSIS — I10 Essential (primary) hypertension: Secondary | ICD-10-CM | POA: Diagnosis not present

## 2019-08-04 DIAGNOSIS — N2581 Secondary hyperparathyroidism of renal origin: Secondary | ICD-10-CM | POA: Diagnosis not present

## 2019-08-07 DIAGNOSIS — Z7901 Long term (current) use of anticoagulants: Secondary | ICD-10-CM | POA: Diagnosis not present

## 2019-08-09 DIAGNOSIS — I10 Essential (primary) hypertension: Secondary | ICD-10-CM | POA: Diagnosis not present

## 2019-08-14 DIAGNOSIS — Z7901 Long term (current) use of anticoagulants: Secondary | ICD-10-CM | POA: Diagnosis not present

## 2019-08-15 ENCOUNTER — Ambulatory Visit (INDEPENDENT_AMBULATORY_CARE_PROVIDER_SITE_OTHER): Payer: Medicare HMO | Admitting: Family

## 2019-08-15 ENCOUNTER — Other Ambulatory Visit: Payer: Self-pay

## 2019-08-15 ENCOUNTER — Encounter: Payer: Self-pay | Admitting: Family

## 2019-08-15 ENCOUNTER — Other Ambulatory Visit (HOSPITAL_COMMUNITY)
Admission: RE | Admit: 2019-08-15 | Discharge: 2019-08-15 | Disposition: A | Discharge status: Home / Self Care | Payer: Medicare HMO | Source: Ambulatory Visit | Attending: Family | Admitting: Family

## 2019-08-15 VITALS — BP 158/60 | HR 58 | Temp 97.7°F | Resp 17 | Ht 67.0 in | Wt 132.0 lb

## 2019-08-15 DIAGNOSIS — Z0000 Encounter for general adult medical examination without abnormal findings: Secondary | ICD-10-CM

## 2019-08-15 DIAGNOSIS — Z01419 Encounter for gynecological examination (general) (routine) without abnormal findings: Secondary | ICD-10-CM | POA: Diagnosis not present

## 2019-08-15 DIAGNOSIS — Z7682 Awaiting organ transplant status: Secondary | ICD-10-CM | POA: Diagnosis not present

## 2019-08-15 DIAGNOSIS — N186 End stage renal disease: Secondary | ICD-10-CM | POA: Diagnosis not present

## 2019-08-15 DIAGNOSIS — H6981 Other specified disorders of Eustachian tube, right ear: Secondary | ICD-10-CM | POA: Diagnosis not present

## 2019-08-15 DIAGNOSIS — H6505 Acute serous otitis media, recurrent, left ear: Secondary | ICD-10-CM | POA: Diagnosis not present

## 2019-08-15 NOTE — Progress Notes (Signed)
Subjective:    Patient ID: Jillian Hayes, female    DOB: 1945-09-24, 74 y.o.   MRN: [id number]  HPI   Patient is a 74 yr old female who presents today requesting a Pap smear due to requirements to be placed on a renal transplant list.  She reports no GYN concerns at this time.     Review of Systems See HPI  Past Medical History:  Diagnosis Date  . Advanced care planning/counseling discussion 06/10/2014   05/31/2014 patient presents copy of HCP and Living Will  . Anemia    iron deficiency  . Anxiety   . Atrial fibrillation ([place])   . Benign fundic gland polyps of stomach   . Bladder polyps 06/25/2010  . Chronic headaches 06/24/2010  . Chronic renal insufficiency, stage 4 (severe) ([place]) 11/09/2015  . Depression 1991   hospitalized  . History of chicken pox 06/25/2010  . Hypercalcemia 02/18/2014  . Hypertension   . Insomnia 06/24/2010  . Multiple chemical sensitivity syndrome 06/25/2010  . Proteinuria 02/18/2014  . Renal insufficiency 03/26/2011  . Valvular heart disease 04/28/2016     Social History   Socioeconomic History  . Marital status: Married    Spouse name: Not on file  . Number of children: 2  . Years of education: Not on file  . Highest education level: Not on file  Occupational History  . Occupation: retired  Tobacco Use  . Smoking status: Never Smoker  . Smokeless tobacco: Never Used  Vaping Use  . Vaping Use: Never used  Substance and Sexual Activity  . Alcohol use: No  . Drug use: No  . Sexual activity: Yes    Partners: Male  Other Topics Concern  . Not on file  Social History Narrative   Married and lives with husband.     Retired   1 son and 1 daughter   No alcohol tobacco or caffeine   Social Determinants of Radio broadcast assistant Strain:   . Difficulty of Paying Living Expenses:   Food Insecurity:   . Worried About Charity fundraiser in the Last Year:   . Arboriculturist in the Last Year:   Transportation Needs:   . Lexicographer (Medical):   Marland Kitchen Lack of Transportation (Non-Medical):   Physical Activity:   . Days of Exercise per Week:   . Minutes of Exercise per Session:   Stress:   . Feeling of Stress :   Social Connections:   . Frequency of Communication with Friends and Family:   . Frequency of Social Gatherings with Friends and Family:   . Attends Religious Services:   . Active Member of Clubs or Organizations:   . Attends Archivist Meetings:   Marland Kitchen Marital Status:   Intimate Partner Violence:   . Fear of Current or Ex-Partner:   . Emotionally Abused:   Marland Kitchen Physically Abused:   . Sexually Abused:     Past Surgical History:  Procedure Laterality Date  . COLONOSCOPY  2014  . cyst on left breast removed Left 1991   benign  . ESOPHAGOGASTRODUODENOSCOPY (EGD) WITH ESOPHAGEAL DILATION  2014  . NASAL SEPTUM SURGERY  1986   rhinoplasty  . polyps on bladder removed  1972   benign  . TONSILLECTOMY  1962    Family History  Problem Relation Age of Onset  . Breast cancer Mother 59       left breast removed, 2010 lung  . Diabetes  Mother   . Nephrolithiasis Father   . Heart attack Father 2       X 3  . Heart disease Father        smoker  . Hypertension Brother   . Allergic rhinitis Brother   . Melanoma Son 37       melanoma on leg removed  . Pancreatic cancer Maternal Grandmother   . Hypertension Paternal Grandmother   . Obesity Paternal Grandmother   . Heart attack Paternal Grandfather 80  . Diabetes Maternal Aunt        x 2  . Diabetes Maternal Uncle        x 3  . Asthma Neg Hx   . Eczema Neg Hx   . Urticaria Neg Hx   . Immunodeficiency Neg Hx   . Angioedema Neg Hx     Allergies  Allergen Reactions  . Sulfa Antibiotics Nausea And Vomiting  . Clonidine Derivatives     Current Outpatient Medications on File Prior to Visit  Medication Sig Dispense Refill  . acetaminophen (TYLENOL) 500 MG tablet Take 500 mg by mouth every 6 (six) hours as needed for headache  (pain).    . Alfalfa 250 MG TABS Take 1,500 mg by mouth 3 (three) times daily.    Marland Kitchen atorvastatin (LIPITOR) 20 MG tablet Take 20 mg by mouth daily.    Marland Kitchen b complex vitamins capsule Take 1 capsule by mouth 2 (two) times daily with a meal.     . Cholecalciferol (VITAMIN D PO) Take by mouth daily.    . cinacalcet (SENSIPAR) 30 MG tablet Take by mouth.    . diltiazem (CARDIZEM CD) 240 MG 24 hr capsule Take 240 mg by mouth daily.    . Flaxseed, Linseed, (FLAX SEED OIL PO) Take 1 capsule 2 (two) times daily by mouth.     . IRON PO Take 15 mg by mouth daily. Shaklee product    . L-GLUTAMINE PO Take 1 capsule by mouth daily after supper.     . losartan (COZAAR) 50 MG tablet Take 1 tablet (50 mg total) by mouth in the morning and at bedtime. 180 tablet 3  . multivitamin (RENA-VIT) TABS tablet Take 1 tablet by mouth daily.    . Probiotic Product (PROBIOTIC DAILY PO) Take 1 tablet by mouth daily with lunch.     . sevelamer carbonate (RENVELA) 800 MG tablet Take 1,600 mg by mouth 3 (three) times daily.    . vitamin C (ASCORBIC ACID) 500 MG tablet Take 500 mg by mouth daily as needed (immune system boost).     Marland Kitchen VITAMIN E PO Take 1 capsule by mouth daily with lunch.    . warfarin (COUMADIN) 5 MG tablet TAKE 1 & 1/2 (ONE & ONE-HALF) TABLETS BY MOUTH ONCE DAILY AS DIRECTED 45 tablet 0  . carvedilol (COREG) 25 MG tablet Take 1 tablet (25 mg total) by mouth 2 (two) times daily. 180 tablet 3  . hydrALAZINE (APRESOLINE) 100 MG tablet Take 1 tablet by mouth 3 (three) times daily.      No current facility-administered medications on file prior to visit.    BP (!) 158/60 (BP Location: Right Arm, Patient Position: Sitting, Cuff Size: Normal)   Pulse (!) 58   Temp 97.7 F (36.5 C) (Temporal)   Resp 17   Ht 5\' 7"  (1.702 m)   Wt 132 lb (59.9 kg)   SpO2 97%   BMI 20.67 kg/m       Objective:  Physical Exam Exam conducted with a chaperone present.  Constitutional:      Appearance: She is well-developed.    Neck:     Thyroid: No thyromegaly.  Cardiovascular:     Rate and Rhythm: Normal rate and regular rhythm.     Heart sounds: Normal heart sounds. No murmur heard.   Pulmonary:     Effort: Pulmonary effort is normal. No respiratory distress.     Breath sounds: Normal breath sounds. No wheezing.  Genitourinary:    Labia:        Right: No rash.        Left: Rash present.      Cervix: Normal.     Uterus: Normal.      Adnexa: Right adnexa normal and left adnexa normal.     Rectum: External hemorrhoid present.  Musculoskeletal:     Cervical back: Neck supple.  Skin:    General: Skin is warm and dry.  Neurological:     Mental Status: She is alert and oriented to person, place, and time.  Psychiatric:        Behavior: Behavior normal.        Thought Content: Thought content normal.        Judgment: Judgment normal.           Assessment & Plan:  Encounter for GYN exam-Pap performed today with HPV.  End-stage renal disease-she is currently maintained on hemodialysis.  She states she currently has a fistula which is functioning properly.  They have had a lot of issues with her access.  She is hopeful that she will be a candidate for a kidney transplant.  15 minutes spent on today's visit.  The majority of this time was spent examining the patient and performing her Pap smear.

## 2019-08-15 NOTE — Patient Instructions (Signed)
Please follow up as scheduled with Dr. Charlett Blake.

## 2019-08-17 LAB — CYTOLOGY - PAP: Diagnosis: NEGATIVE

## 2019-08-21 DIAGNOSIS — Z7901 Long term (current) use of anticoagulants: Secondary | ICD-10-CM | POA: Diagnosis not present

## 2019-08-28 DIAGNOSIS — Z7901 Long term (current) use of anticoagulants: Secondary | ICD-10-CM | POA: Diagnosis not present

## 2019-09-02 DIAGNOSIS — N186 End stage renal disease: Secondary | ICD-10-CM | POA: Diagnosis not present

## 2019-09-02 DIAGNOSIS — Z992 Dependence on renal dialysis: Secondary | ICD-10-CM | POA: Diagnosis not present

## 2019-09-04 DIAGNOSIS — N186 End stage renal disease: Secondary | ICD-10-CM | POA: Diagnosis not present

## 2019-09-04 DIAGNOSIS — N2581 Secondary hyperparathyroidism of renal origin: Secondary | ICD-10-CM | POA: Diagnosis not present

## 2019-09-04 DIAGNOSIS — Z7901 Long term (current) use of anticoagulants: Secondary | ICD-10-CM | POA: Diagnosis not present

## 2019-09-04 DIAGNOSIS — D509 Iron deficiency anemia, unspecified: Secondary | ICD-10-CM | POA: Diagnosis not present

## 2019-09-04 DIAGNOSIS — D631 Anemia in chronic kidney disease: Secondary | ICD-10-CM | POA: Diagnosis not present

## 2019-09-11 DIAGNOSIS — Z7901 Long term (current) use of anticoagulants: Secondary | ICD-10-CM | POA: Diagnosis not present

## 2019-09-12 DIAGNOSIS — N858 Other specified noninflammatory disorders of uterus: Secondary | ICD-10-CM | POA: Diagnosis not present

## 2019-09-12 DIAGNOSIS — I313 Pericardial effusion (noninflammatory): Secondary | ICD-10-CM | POA: Diagnosis not present

## 2019-09-12 DIAGNOSIS — Z992 Dependence on renal dialysis: Secondary | ICD-10-CM | POA: Diagnosis not present

## 2019-09-12 DIAGNOSIS — I517 Cardiomegaly: Secondary | ICD-10-CM | POA: Diagnosis not present

## 2019-09-12 DIAGNOSIS — A310 Pulmonary mycobacterial infection: Secondary | ICD-10-CM | POA: Diagnosis not present

## 2019-09-12 DIAGNOSIS — Z01818 Encounter for other preprocedural examination: Secondary | ICD-10-CM | POA: Diagnosis not present

## 2019-09-12 DIAGNOSIS — J479 Bronchiectasis, uncomplicated: Secondary | ICD-10-CM | POA: Diagnosis not present

## 2019-09-12 DIAGNOSIS — E042 Nontoxic multinodular goiter: Secondary | ICD-10-CM | POA: Diagnosis not present

## 2019-09-12 DIAGNOSIS — N186 End stage renal disease: Secondary | ICD-10-CM | POA: Diagnosis not present

## 2019-09-18 DIAGNOSIS — Z7901 Long term (current) use of anticoagulants: Secondary | ICD-10-CM | POA: Diagnosis not present

## 2019-09-25 DIAGNOSIS — Z7901 Long term (current) use of anticoagulants: Secondary | ICD-10-CM | POA: Diagnosis not present

## 2019-10-02 DIAGNOSIS — Z7901 Long term (current) use of anticoagulants: Secondary | ICD-10-CM | POA: Diagnosis not present

## 2019-10-03 DIAGNOSIS — Z992 Dependence on renal dialysis: Secondary | ICD-10-CM | POA: Diagnosis not present

## 2019-10-03 DIAGNOSIS — N186 End stage renal disease: Secondary | ICD-10-CM | POA: Diagnosis not present

## 2019-10-04 DIAGNOSIS — D509 Iron deficiency anemia, unspecified: Secondary | ICD-10-CM | POA: Diagnosis not present

## 2019-10-04 DIAGNOSIS — N186 End stage renal disease: Secondary | ICD-10-CM | POA: Diagnosis not present

## 2019-10-04 DIAGNOSIS — N2581 Secondary hyperparathyroidism of renal origin: Secondary | ICD-10-CM | POA: Diagnosis not present

## 2019-10-04 DIAGNOSIS — D631 Anemia in chronic kidney disease: Secondary | ICD-10-CM | POA: Diagnosis not present

## 2019-10-04 DIAGNOSIS — D899 Disorder involving the immune mechanism, unspecified: Secondary | ICD-10-CM | POA: Diagnosis not present

## 2019-10-09 DIAGNOSIS — Z7901 Long term (current) use of anticoagulants: Secondary | ICD-10-CM | POA: Diagnosis not present

## 2019-10-10 DIAGNOSIS — R69 Illness, unspecified: Secondary | ICD-10-CM | POA: Diagnosis not present

## 2019-10-20 ENCOUNTER — Encounter (HOSPITAL_BASED_OUTPATIENT_CLINIC_OR_DEPARTMENT_OTHER): Payer: Self-pay | Admitting: *Deleted

## 2019-10-20 ENCOUNTER — Emergency Department (HOSPITAL_BASED_OUTPATIENT_CLINIC_OR_DEPARTMENT_OTHER)
Admission: EM | Admit: 2019-10-20 | Discharge: 2019-10-21 | Disposition: A | Discharge status: Home / Self Care | Payer: Medicare HMO | Attending: Emergency Medicine | Admitting: Emergency Medicine

## 2019-10-20 ENCOUNTER — Emergency Department (HOSPITAL_BASED_OUTPATIENT_CLINIC_OR_DEPARTMENT_OTHER): Payer: Medicare HMO

## 2019-10-20 ENCOUNTER — Telehealth: Payer: Self-pay | Admitting: Family Medicine

## 2019-10-20 ENCOUNTER — Other Ambulatory Visit: Payer: Self-pay

## 2019-10-20 DIAGNOSIS — N83202 Unspecified ovarian cyst, left side: Secondary | ICD-10-CM

## 2019-10-20 DIAGNOSIS — K5730 Diverticulosis of large intestine without perforation or abscess without bleeding: Secondary | ICD-10-CM | POA: Diagnosis not present

## 2019-10-20 DIAGNOSIS — N184 Chronic kidney disease, stage 4 (severe): Secondary | ICD-10-CM | POA: Insufficient documentation

## 2019-10-20 DIAGNOSIS — Z79899 Other long term (current) drug therapy: Secondary | ICD-10-CM | POA: Diagnosis not present

## 2019-10-20 DIAGNOSIS — N3000 Acute cystitis without hematuria: Secondary | ICD-10-CM | POA: Diagnosis not present

## 2019-10-20 DIAGNOSIS — I129 Hypertensive chronic kidney disease with stage 1 through stage 4 chronic kidney disease, or unspecified chronic kidney disease: Secondary | ICD-10-CM | POA: Diagnosis not present

## 2019-10-20 DIAGNOSIS — R1084 Generalized abdominal pain: Secondary | ICD-10-CM | POA: Diagnosis present

## 2019-10-20 DIAGNOSIS — Z7901 Long term (current) use of anticoagulants: Secondary | ICD-10-CM | POA: Diagnosis not present

## 2019-10-20 DIAGNOSIS — R141 Gas pain: Secondary | ICD-10-CM

## 2019-10-20 DIAGNOSIS — I700 Atherosclerosis of aorta: Secondary | ICD-10-CM | POA: Diagnosis not present

## 2019-10-20 DIAGNOSIS — K8020 Calculus of gallbladder without cholecystitis without obstruction: Secondary | ICD-10-CM | POA: Diagnosis not present

## 2019-10-20 LAB — CBC
HCT: 38.9 % (ref 36.0–46.0)
Hemoglobin: 12.2 g/dL (ref 12.0–15.0)
MCH: 31.6 pg (ref 26.0–34.0)
MCHC: 31.4 g/dL (ref 30.0–36.0)
MCV: 100.8 fL — ABNORMAL HIGH (ref 80.0–100.0)
Platelets: 224 10*3/uL (ref 150–400)
RBC: 3.86 MIL/uL — ABNORMAL LOW (ref 3.87–5.11)
RDW: 14.8 % (ref 11.5–15.5)
WBC: 12.4 10*3/uL — ABNORMAL HIGH (ref 4.0–10.5)
nRBC: 0 % (ref 0.0–0.2)

## 2019-10-20 LAB — URINALYSIS, ROUTINE W REFLEX MICROSCOPIC
Bilirubin Urine: NEGATIVE
Glucose, UA: NEGATIVE mg/dL
Hgb urine dipstick: NEGATIVE
Ketones, ur: NEGATIVE mg/dL
Leukocytes,Ua: NEGATIVE
Nitrite: NEGATIVE
Protein, ur: >300 mg/dL — AB
Specific Gravity, Urine: >1.03 — ABNORMAL HIGH (ref 1.005–1.030)
pH: 5.5 (ref 5.0–8.0)

## 2019-10-20 LAB — URINALYSIS, MICROSCOPIC (REFLEX)

## 2019-10-20 LAB — COMPREHENSIVE METABOLIC PANEL
ALT: 14 U/L (ref 0–44)
AST: 20 U/L (ref 15–41)
Albumin: 3.6 g/dL (ref 3.5–5.0)
Alkaline Phosphatase: 73 U/L (ref 38–126)
Anion gap: 12 (ref 5–15)
BUN: 67 mg/dL — ABNORMAL HIGH (ref 8–23)
CO2: 20 mmol/L — ABNORMAL LOW (ref 22–32)
Calcium: 9.7 mg/dL (ref 8.9–10.3)
Chloride: 106 mmol/L (ref 98–111)
Creatinine, Ser: 3.98 mg/dL — ABNORMAL HIGH (ref 0.44–1.00)
GFR calc Af Amer: 12 mL/min — ABNORMAL LOW (ref >60)
GFR calc non Af Amer: 11 mL/min — ABNORMAL LOW (ref >60)
Glucose, Bld: 101 mg/dL — ABNORMAL HIGH (ref 70–99)
Potassium: 4.3 mmol/L (ref 3.5–5.1)
Sodium: 138 mmol/L (ref 135–145)
Total Bilirubin: 0.7 mg/dL (ref 0.3–1.2)
Total Protein: 6.7 g/dL (ref 6.5–8.1)

## 2019-10-20 LAB — LIPASE, BLOOD: Lipase: 42 U/L (ref 11–51)

## 2019-10-20 NOTE — Telephone Encounter (Signed)
CallerLucynda Hayes  Call Back @ [phone]  Patient states she is having rectal pain and abdominal pain . Pt states symptoms since Monday .  Please Advise

## 2019-10-20 NOTE — ED Triage Notes (Signed)
4 days ago LLQ pain. Nausea. Rectal pressure. No hx of diverticulitis.

## 2019-10-20 NOTE — Telephone Encounter (Signed)
Patient has decided to go to urgent care today, and regarding possible mild abdominal pains that come and go, and having experiencing gas-like symptoms with no smell, like air, patient states.  Patient has hx. Hemorroids. Rectal pain as well, with normal Bowel Movements.  Patient will fax notes to our office for pcp to view.

## 2019-10-21 ENCOUNTER — Encounter (HOSPITAL_BASED_OUTPATIENT_CLINIC_OR_DEPARTMENT_OTHER): Payer: Self-pay | Admitting: Emergency Medicine

## 2019-10-21 ENCOUNTER — Ambulatory Visit (HOSPITAL_BASED_OUTPATIENT_CLINIC_OR_DEPARTMENT_OTHER)
Admission: RE | Admit: 2019-10-21 | Discharge: 2019-10-21 | Disposition: A | Discharge status: Home / Self Care | Payer: Medicare HMO | Source: Ambulatory Visit | Attending: Emergency Medicine | Admitting: Emergency Medicine

## 2019-10-21 DIAGNOSIS — R1032 Left lower quadrant pain: Secondary | ICD-10-CM | POA: Diagnosis not present

## 2019-10-21 DIAGNOSIS — N83291 Other ovarian cyst, right side: Secondary | ICD-10-CM | POA: Diagnosis not present

## 2019-10-21 DIAGNOSIS — N83292 Other ovarian cyst, left side: Secondary | ICD-10-CM | POA: Diagnosis not present

## 2019-10-21 MED ORDER — ACETAMINOPHEN 500 MG PO TABS
1000.0000 mg | ORAL_TABLET | Freq: Once | ORAL | Status: AC
  Administered 2019-10-21: 1000 mg via ORAL
  Filled 2019-10-21: qty 2

## 2019-10-21 MED ORDER — CEPHALEXIN 500 MG PO CAPS: ORAL_CAPSULE | ORAL | 0 refills | Status: DC

## 2019-10-21 MED ORDER — CEPHALEXIN 250 MG PO CAPS
500.0000 mg | ORAL_CAPSULE | Freq: Once | ORAL | Status: AC
  Administered 2019-10-21: 500 mg via ORAL
  Filled 2019-10-21: qty 2

## 2019-10-21 MED ORDER — KETOROLAC TROMETHAMINE 60 MG/2ML IM SOLN
30.0000 mg | Freq: Once | INTRAMUSCULAR | Status: AC
  Administered 2019-10-21: 30 mg via INTRAMUSCULAR
  Filled 2019-10-21: qty 2

## 2019-10-21 NOTE — ED Provider Notes (Signed)
3:58 PM Patient returns for GYN ultrasound.  I reviewed ultrasound with results with patient.  I reviewed her imaging and labs from visit earlier this morning.  Patient to follow-up with GYN as previously recommended.  Encouraged return with worsening otherwise follow-up with her PCP.      Carlisle Cater, PA-C 10/21/19 [place], April, MD 11/10/19 [id number]

## 2019-10-21 NOTE — ED Provider Notes (Signed)
[facility] EMERGENCY DEPARTMENT Provider Note   CSN: [id number] Arrival date & time: 10/20/19  1837     History Chief Complaint  Patient presents with  . Abdominal Pain    Jillian Hayes is a 74 y.o. female.  The history is provided by the patient.  Abdominal Pain Pain location:  Generalized Pain quality: cramping   Pain radiates to:  Does not radiate Pain severity:  Moderate Onset quality:  Gradual Duration:  6 days Timing:  Constant Progression:  Unchanged Chronicity:  New Relieved by:  Nothing Worsened by:  Nothing Ineffective treatments:  None tried Associated symptoms: no anorexia, no belching, no chest pain, no chills, no constipation, no cough, no diarrhea, no dysuria, no fatigue, no fever, no flatus, no hematemesis, no hematochezia, no hematuria, no melena, no nausea, no shortness of breath, no sore throat and no vomiting   Risk factors: no alcohol abuse   ESRD with cramping and large flatus since Monday.  Pain is waxing and waning. No f/c/r.  Has not taken anything for symptoms.  No n/v/d.       Past Medical History:  Diagnosis Date  . Advanced care planning/counseling discussion 06/10/2014   05/31/2014 patient presents copy of HCP and Living Will  . Anemia    iron deficiency  . Anxiety   . Atrial fibrillation ([place])   . Benign fundic gland polyps of stomach   . Bladder polyps 06/25/2010  . Chronic headaches 06/24/2010  . Chronic renal insufficiency, stage 4 (severe) ([employer]) 11/09/2015  . Depression 1991   hospitalized  . History of chicken pox 06/25/2010  . Hypercalcemia 02/18/2014  . Hypertension   . Insomnia 06/24/2010  . Multiple chemical sensitivity syndrome 06/25/2010  . Proteinuria 02/18/2014  . Renal insufficiency 03/26/2011  . Valvular heart disease 04/28/2016    Patient Active Problem List   Diagnosis Date Noted  . Nonrheumatic mitral valve regurgitation 06/12/2019  . Edema 04/09/2019  . Drug-induced constipation 10/18/2018  . External  prolapsed hemorrhoids 09/25/2018  . Leg swelling 03/03/2018  . Long term (current) use of anticoagulants 11/01/2017  . Atrial fibrillation ([place]) 10/25/2017  . DDD (degenerative disc disease), lumbar 04/07/2017  . Hearing loss 01/04/2017  . Hyperlipidemia 01/04/2017  . Arthritis of fingers of both hands 10/01/2016  . Chronic rhinitis 10/01/2016  . Nonrheumatic aortic valve insufficiency 09/29/2016  . Bruit 09/29/2016  . Palpitation 09/22/2016  . Aortic valve disease 04/28/2016  . Anemia of chronic renal failure, stage 4 (severe) ([place]) 03/03/2016  . Sun-damaged skin 01/21/2016  . ESRD (end stage renal disease) ([place]) 11/09/2015  . Advanced care planning/counseling discussion 06/10/2014  . Pedal edema 03/28/2014  . Proteinuria 02/18/2014  . Hypercalcemia 02/18/2014  . Abnormal EKG 01/22/2014  . HTN (hypertension) 01/22/2014  . Dermatitis 06/30/2013  . Abnormal thyroid function test 06/25/2013  . Cervical cancer screening 06/10/2012  . Medicare annual wellness visit, subsequent 11/09/2011  . Multiple chemical sensitivity syndrome 06/25/2010  . History of chicken pox 06/25/2010  . Bladder polyps 06/25/2010  . History of cardiac dysrhythmia 06/25/2010  . Insomnia 06/24/2010  . Overweight 06/24/2010  . Fatigue 06/24/2010  . Chronic headaches 06/24/2010  . Allergy   . Anemia   . Depression     Past Surgical History:  Procedure Laterality Date  . COLONOSCOPY  2014  . cyst on left breast removed Left 1991   benign  . ESOPHAGOGASTRODUODENOSCOPY (EGD) WITH ESOPHAGEAL DILATION  2014  . NASAL SEPTUM SURGERY  1986   rhinoplasty  .  polyps on bladder removed  1972   benign  . TONSILLECTOMY  1962     OB History   No obstetric history on file.     Family History  Problem Relation Age of Onset  . Breast cancer Mother 49       left breast removed, 2010 lung  . Diabetes Mother   . Nephrolithiasis Father   . Heart attack Father 31       X 3  . Heart disease Father         smoker  . Hypertension Brother   . Allergic rhinitis Brother   . Melanoma Son 37       melanoma on leg removed  . Pancreatic cancer Maternal Grandmother   . Hypertension Paternal Grandmother   . Obesity Paternal Grandmother   . Heart attack Paternal Grandfather 23  . Diabetes Maternal Aunt        x 2  . Diabetes Maternal Uncle        x 3  . Asthma Neg Hx   . Eczema Neg Hx   . Urticaria Neg Hx   . Immunodeficiency Neg Hx   . Angioedema Neg Hx     Social History   Tobacco Use  . Smoking status: Never Smoker  . Smokeless tobacco: Never Used  Vaping Use  . Vaping Use: Never used  Substance Use Topics  . Alcohol use: No  . Drug use: No    Home Medications Prior to Admission medications   Medication Sig Start Date End Date Taking? Authorizing Provider  acetaminophen (TYLENOL) 500 MG tablet Take 500 mg by mouth every 6 (six) hours as needed for headache (pain).    [provider]  Alfalfa 250 MG TABS Take 1,500 mg by mouth 3 (three) times daily.    [provider]  atorvastatin (LIPITOR) 20 MG tablet Take 20 mg by mouth daily.    [provider]  b complex vitamins capsule Take 1 capsule by mouth 2 (two) times daily with a meal.     [provider]  carvedilol (COREG) 25 MG tablet Take 1 tablet (25 mg total) by mouth 2 (two) times daily. 04/11/19 07/10/19  Minus Breeding, MD  Cholecalciferol (VITAMIN D PO) Take by mouth daily.    [provider]  cinacalcet (SENSIPAR) 30 MG tablet Take by mouth.    [provider]  diltiazem (CARDIZEM CD) 240 MG 24 hr capsule Take 240 mg by mouth daily.    [provider]  Flaxseed, Linseed, (FLAX SEED OIL PO) Take 1 capsule 2 (two) times daily by mouth.     [provider]  hydrALAZINE (APRESOLINE) 100 MG tablet Take 1 tablet by mouth 3 (three) times daily.  09/06/18 01/10/19  [provider]  IRON PO Take 15 mg by mouth daily. Shaklee product    [provider]   L-GLUTAMINE PO Take 1 capsule by mouth daily after supper.     [provider]  losartan (COZAAR) 50 MG tablet Take 1 tablet (50 mg total) by mouth in the morning and at bedtime. 04/11/19   Minus Breeding, MD  multivitamin (RENA-VIT) TABS tablet Take 1 tablet by mouth daily.    [provider]  Probiotic Product (PROBIOTIC DAILY PO) Take 1 tablet by mouth daily with lunch.     [provider]  sevelamer carbonate (RENVELA) 800 MG tablet Take 1,600 mg by mouth 3 (three) times daily. 09/07/18   [provider]  vitamin  C (ASCORBIC ACID) 500 MG tablet Take 500 mg by mouth daily as needed (immune system boost).     [provider]  VITAMIN E PO Take 1 capsule by mouth daily with lunch.    [provider]  warfarin (COUMADIN) 5 MG tablet TAKE 1 & 1/2 (ONE & ONE-HALF) TABLETS BY MOUTH ONCE DAILY AS DIRECTED 12/14/18   Minus Breeding, MD    Allergies    Sulfa antibiotics and Clonidine derivatives  Review of Systems   Review of Systems  Constitutional: Negative for chills, fatigue and fever.  HENT: Negative for sore throat.   Eyes: Negative for visual disturbance.  Respiratory: Negative for cough and shortness of breath.   Cardiovascular: Negative for chest pain.  Gastrointestinal: Positive for abdominal pain. Negative for anorexia, constipation, diarrhea, flatus, hematemesis, hematochezia, melena, nausea and vomiting.  Genitourinary: Negative for dysuria and hematuria.  Neurological: Negative for dizziness.  Psychiatric/Behavioral: Negative for agitation.  All other systems reviewed and are negative.   Physical Exam Updated Vital Signs BP 140/68 (BP Location: Left Arm)   Pulse 74   Temp 98.6 F (37 C) (Oral)   Resp 19   Ht 5\' 7"  (1.702 m)   Wt 59.9 kg   SpO2 99%   BMI 20.68 kg/m   Physical Exam Vitals and nursing note reviewed.  Constitutional:      General: She is not in acute distress.    Appearance: Normal appearance.    HENT:     Head: Normocephalic and atraumatic.     Nose: Nose normal.  Eyes:     Conjunctiva/sclera: Conjunctivae normal.     Pupils: Pupils are equal, round, and reactive to light.  Cardiovascular:     Rate and Rhythm: Normal rate and regular rhythm.     Pulses: Normal pulses.     Heart sounds: Normal heart sounds.  Pulmonary:     Effort: Pulmonary effort is normal.     Breath sounds: Normal breath sounds.  Abdominal:     General: Abdomen is flat.     Palpations: Abdomen is soft.     Tenderness: There is no abdominal tenderness. There is no guarding or rebound.     Comments: Gassy throughout   Musculoskeletal:        General: Normal range of motion.     Cervical back: Normal range of motion and neck supple.  Skin:    General: Skin is warm and dry.     Capillary Refill: Capillary refill takes less than 2 seconds.  Neurological:     General: No focal deficit present.     Mental Status: She is alert and oriented to person, place, and time.     Deep Tendon Reflexes: Reflexes normal.  Psychiatric:        Mood and Affect: Mood normal.        Behavior: Behavior normal.     ED Results / Procedures / Treatments   Labs (all labs ordered are listed, but only abnormal results are displayed) Results for orders placed or performed during the hospital encounter of 10/20/19  Lipase, blood  Result Value Ref Range   Lipase 42 11 - 51 U/L  Comprehensive metabolic panel  Result Value Ref Range   Sodium 138 135 - 145 mmol/L   Potassium 4.3 3.5 - 5.1 mmol/L   Chloride 106 98 - 111 mmol/L   CO2 20 (L) 22 - 32 mmol/L   Glucose, Bld 101 (H) 70 - 99 mg/dL   BUN 67 (  H) 8 - 23 mg/dL   Creatinine, Ser 3.98 (H) 0.44 - 1.00 mg/dL   Calcium 9.7 8.9 - 10.3 mg/dL   Total Protein 6.7 6.5 - 8.1 g/dL   Albumin 3.6 3.5 - 5.0 g/dL   AST 20 15 - 41 U/L   ALT 14 0 - 44 U/L   Alkaline Phosphatase 73 38 - 126 U/L   Total Bilirubin 0.7 0.3 - 1.2 mg/dL   GFR calc non Af Amer 11 (L) >60 mL/min   GFR  calc Af Amer 12 (L) >60 mL/min   Anion gap 12 5 - 15  CBC  Result Value Ref Range   WBC 12.4 (H) 4.0 - 10.5 K/uL   RBC 3.86 (L) 3.87 - 5.11 MIL/uL   Hemoglobin 12.2 12.0 - 15.0 g/dL   HCT 38.9 36 - 46 %   MCV 100.8 (H) 80.0 - 100.0 fL   MCH 31.6 26.0 - 34.0 pg   MCHC 31.4 30.0 - 36.0 g/dL   RDW 14.8 11.5 - 15.5 %   Platelets 224 150 - 400 K/uL   nRBC 0.0 0.0 - 0.2 %  Urinalysis, Routine w reflex microscopic Urine, Clean Catch  Result Value Ref Range   Color, Urine YELLOW YELLOW   APPearance CLOUDY (A) CLEAR   Specific Gravity, Urine >1.030 (H) 1.005 - 1.030   pH 5.5 5.0 - 8.0   Glucose, UA NEGATIVE NEGATIVE mg/dL   Hgb urine dipstick NEGATIVE NEGATIVE   Bilirubin Urine NEGATIVE NEGATIVE   Ketones, ur NEGATIVE NEGATIVE mg/dL   Protein, ur >300 (A) NEGATIVE mg/dL   Nitrite NEGATIVE NEGATIVE   Leukocytes,Ua NEGATIVE NEGATIVE  Urinalysis, Microscopic (reflex)  Result Value Ref Range   RBC / HPF 0-5 0 - 5 RBC/hpf   WBC, UA 11-20 0 - 5 WBC/hpf   Bacteria, UA FEW (A) NONE SEEN   Squamous Epithelial / LPF 6-10 0 - 5   Hyaline Casts, UA PRESENT    Granular Casts, UA PRESENT    CT Renal Stone Study  Result Date: 10/21/2019 CLINICAL DATA:  Left lower quadrant pain for 4 days, nausea EXAM: CT ABDOMEN AND PELVIS WITHOUT CONTRAST TECHNIQUE: Multidetector CT imaging of the abdomen and pelvis was performed following the standard protocol without IV contrast. Unenhanced CT was performed per clinician order. Lack of IV contrast limits sensitivity and specificity, especially for evaluation of abdominal/pelvic solid viscera. COMPARISON:  None. FINDINGS: Lower chest: No acute pleural or parenchymal lung disease. Hepatobiliary: Multiple hepatic cysts. No intrahepatic duct dilation. Calcified gallstones are identified without cholecystitis. Pancreas: Unremarkable. No pancreatic ductal dilatation or surrounding inflammatory changes. Spleen: Normal in size without focal abnormality. Adrenals/Urinary  Tract: No urinary tract calculi or obstructive uropathy. There are bilateral renal cysts. Adrenals are grossly unremarkable. The bladder is decompressed, which limits its evaluation. Stomach/Bowel: No bowel obstruction or ileus. There is a normal gas-filled appendix in the right lower quadrant. There is diffuse diverticulosis of the sigmoid colon. No definite wall thickening or inflammatory change on this examination limited by the lack of intravenous and oral contrast. Vascular/Lymphatic: Aortic atherosclerosis. No enlarged abdominal or pelvic lymph nodes. Reproductive: Uterus is atrophic. 4.1 x 3.3 cm right adnexal cyst. The left ovary appears enlarged measuring 3.5 x 2.7 cm. Other: No free fluid or free gas.  No abdominal wall hernia. Musculoskeletal: No acute or destructive bony lesions. Reconstructed images demonstrate no additional findings. IMPRESSION: 1. No urinary tract calculi or obstructive uropathy. 2. Sigmoid diverticulosis, with no evidence of diverticulitis on this  unenhanced exam. 3. Cholelithiasis without cholecystitis. 4. Left adnexal enlargement as well as a 4.1 cm right adnexal cyst. Because these areas are not adequately characterized, prompt [place] is recommended for further evaluation. Note: This recommendation does not apply to premenarchal patients and to those with increased risk (genetic, family history, elevated tumor markers or other high-risk factors) of ovarian cancer. Reference: JACR 2020 Feb; 17(2):248-254 5.  Aortic Atherosclerosis (ICD10-I70.0). Electronically Signed   By: Randa Ngo M.D.   On: 10/21/2019 00:01    Radiology CT Renal Stone Study  Result Date: 10/21/2019 CLINICAL DATA:  Left lower quadrant pain for 4 days, nausea EXAM: CT ABDOMEN AND PELVIS WITHOUT CONTRAST TECHNIQUE: Multidetector CT imaging of the abdomen and pelvis was performed following the standard protocol without IV contrast. Unenhanced CT was performed per clinician order. Lack of IV contrast limits  sensitivity and specificity, especially for evaluation of abdominal/pelvic solid viscera. COMPARISON:  None. FINDINGS: Lower chest: No acute pleural or parenchymal lung disease. Hepatobiliary: Multiple hepatic cysts. No intrahepatic duct dilation. Calcified gallstones are identified without cholecystitis. Pancreas: Unremarkable. No pancreatic ductal dilatation or surrounding inflammatory changes. Spleen: Normal in size without focal abnormality. Adrenals/Urinary Tract: No urinary tract calculi or obstructive uropathy. There are bilateral renal cysts. Adrenals are grossly unremarkable. The bladder is decompressed, which limits its evaluation. Stomach/Bowel: No bowel obstruction or ileus. There is a normal gas-filled appendix in the right lower quadrant. There is diffuse diverticulosis of the sigmoid colon. No definite wall thickening or inflammatory change on this examination limited by the lack of intravenous and oral contrast. Vascular/Lymphatic: Aortic atherosclerosis. No enlarged abdominal or pelvic lymph nodes. Reproductive: Uterus is atrophic. 4.1 x 3.3 cm right adnexal cyst. The left ovary appears enlarged measuring 3.5 x 2.7 cm. Other: No free fluid or free gas.  No abdominal wall hernia. Musculoskeletal: No acute or destructive bony lesions. Reconstructed images demonstrate no additional findings. IMPRESSION: 1. No urinary tract calculi or obstructive uropathy. 2. Sigmoid diverticulosis, with no evidence of diverticulitis on this unenhanced exam. 3. Cholelithiasis without cholecystitis. 4. Left adnexal enlargement as well as a 4.1 cm right adnexal cyst. Because these areas are not adequately characterized, prompt [place] is recommended for further evaluation. Note: This recommendation does not apply to premenarchal patients and to those with increased risk (genetic, family history, elevated tumor markers or other high-risk factors) of ovarian cancer. Reference: JACR 2020 Feb; 17(2):248-254 5.  Aortic  Atherosclerosis (ICD10-I70.0). Electronically Signed   By: Randa Ngo M.D.   On: 10/21/2019 00:01    Procedures Procedures (including critical care time)  Medications Ordered in ED Medications  ketorolac (TORADOL) injection 30 mg (has no administration in time range)  acetaminophen (TYLENOL) tablet 1,000 mg (has no administration in time range)    ED Course  I have reviewed the triage vital signs and the nursing notes.  Pertinent labs & imaging results that were available during my care of the patient were reviewed by me and considered in my medical decision making (see chart for details).   I do not believe this patient has torsion.  I believe this patient is gassy and that is causing her ongoing symptoms.  She does not have a surgical abdomen.  She is not in distress.  At the behest of radiology I have set the patient up for an outpatient pelvic US to follow up on the ovarian cyst seen on CT scan.  I have cultured the urine and am treating for UTI.  Moreover I have recommended gas  X.  I will refer to GYN regarding ovarian cyst seen on CT scan.    MIKAEL DEBELL was evaluated in Emergency Department on 10/21/2019 for the symptoms described in the history of present illness. She was evaluated in the context of the global COVID-19 pandemic, which necessitated consideration that the patient might be at risk for infection with the SARS-CoV-2 virus that causes COVID-19. Institutional protocols and algorithms that pertain to the evaluation of patients at risk for COVID-19 are in a state of rapid change based on information released by regulatory bodies including the CDC and federal and state organizations. These policies and algorithms were followed during the patient's care in the ED.   Final Clinical Impression(s) / ED Diagnoses Return for intractable cough, coughing up blood,fevers >100.4 unrelieved by medication, shortness of breath, intractable vomiting, chest pain, shortness of breath,  weakness,numbness, changes in speech, facial asymmetry,abdominal pain, passing out,Inability to tolerate liquids or food, cough, altered mental status or any concerns. No signs of systemic illness or infection. The patient is nontoxic-appearing on exam and vital signs are within normal limits.   I have reviewed the triage vital signs and the nursing notes. Pertinent labs &imaging results that were available during my care of the patient were reviewed by me and considered in my medical decision making (see chart for details).After history, exam, and medical workup I feel the patient has beenappropriately medically screened and is safe for discharge home. Pertinent diagnoses were discussed with the patient. Patient was given return precautions.   Jerimie Mancuso, MD 10/21/19 2023

## 2019-10-22 LAB — URINE CULTURE: Culture: NO GROWTH

## 2019-10-25 DIAGNOSIS — Z7901 Long term (current) use of anticoagulants: Secondary | ICD-10-CM | POA: Diagnosis not present

## 2019-10-26 ENCOUNTER — Other Ambulatory Visit: Payer: Self-pay

## 2019-10-26 ENCOUNTER — Encounter: Payer: Self-pay | Admitting: Family Medicine

## 2019-10-26 ENCOUNTER — Ambulatory Visit: Payer: Medicare HMO | Admitting: Family Medicine

## 2019-10-26 ENCOUNTER — Telehealth (INDEPENDENT_AMBULATORY_CARE_PROVIDER_SITE_OTHER): Payer: Medicare HMO | Admitting: Family Medicine

## 2019-10-26 VITALS — HR 74 | Ht 67.0 in | Wt 134.0 lb

## 2019-10-26 DIAGNOSIS — R1310 Dysphagia, unspecified: Secondary | ICD-10-CM | POA: Diagnosis not present

## 2019-10-26 DIAGNOSIS — R1013 Epigastric pain: Secondary | ICD-10-CM | POA: Diagnosis not present

## 2019-10-26 MED ORDER — PANTOPRAZOLE SODIUM 40 MG PO TBEC: 40.0000 mg | DELAYED_RELEASE_TABLET | Freq: Every day | ORAL | 3 refills | Status: DC

## 2019-10-26 NOTE — Progress Notes (Signed)
Virtual Visit via Video Note  I connected with Jillian Hayes on 10/26/19 at 11:20 AM EDT by a video enabled telemedicine application and verified that I am speaking with the correct person using two identifiers.  Location: Patient: home alone  Provider: office    I discussed the limitations of evaluation and management by telemedicine and the availability of in person appointments. The patient expressed understanding and agreed to proceed.  History of Present Illness: Pt is home c/o gas pain s and bloating -- she went to the er and ct scan was done.   She was told she had a lot of gas and to use gas x ----- it helps some but now she is also burping a lot and food gets stuck  No other complaints  Pt is a dialysis pt    Past Medical History:  Diagnosis Date  . Advanced care planning/counseling discussion 06/10/2014   05/31/2014 patient presents copy of HCP and Living Will  . Anemia    iron deficiency  . Anxiety   . Atrial fibrillation ([place])   . Benign fundic gland polyps of stomach   . Bladder polyps 06/25/2010  . Chronic headaches 06/24/2010  . Chronic renal insufficiency, stage 4 (severe) ([place]) 11/09/2015  . Depression 1991   hospitalized  . History of chicken pox 06/25/2010  . Hypercalcemia 02/18/2014  . Hypertension   . Insomnia 06/24/2010  . Multiple chemical sensitivity syndrome 06/25/2010  . Proteinuria 02/18/2014  . Renal insufficiency 03/26/2011  . Valvular heart disease 04/28/2016   Allergies  Allergen Reactions  . Sulfa Antibiotics Nausea And Vomiting  . Clonidine Derivatives    Current Outpatient Medications on File Prior to Visit  Medication Sig Dispense Refill  . acetaminophen (TYLENOL) 500 MG tablet Take 500 mg by mouth every 6 (six) hours as needed for headache (pain).    . Alfalfa 250 MG TABS Take 1,500 mg by mouth 3 (three) times daily.    Marland Kitchen atorvastatin (LIPITOR) 20 MG tablet Take 20 mg by mouth daily.    Marland Kitchen b complex vitamins capsule Take 1 capsule by mouth 2  (two) times daily with a meal.     . cephALEXin (KEFLEX) 500 MG capsule 1 caps po bid x 7 days 14 capsule 0  . Cholecalciferol (VITAMIN D PO) Take by mouth daily.    . cinacalcet (SENSIPAR) 30 MG tablet Take by mouth.    . diltiazem (CARDIZEM CD) 240 MG 24 hr capsule Take 240 mg by mouth daily.    . Flaxseed, Linseed, (FLAX SEED OIL PO) Take 1 capsule 2 (two) times daily by mouth.     . IRON PO Take 15 mg by mouth daily. Shaklee product    . L-GLUTAMINE PO Take 1 capsule by mouth daily after supper.     . losartan (COZAAR) 50 MG tablet Take 1 tablet (50 mg total) by mouth in the morning and at bedtime. 180 tablet 3  . multivitamin (RENA-VIT) TABS tablet Take 1 tablet by mouth daily.    . Probiotic Product (PROBIOTIC DAILY PO) Take 1 tablet by mouth daily with lunch.     . sevelamer carbonate (RENVELA) 800 MG tablet Take 1,600 mg by mouth 3 (three) times daily.    . vitamin C (ASCORBIC ACID) 500 MG tablet Take 500 mg by mouth daily as needed (immune system boost).     Marland Kitchen VITAMIN E PO Take 1 capsule by mouth daily with lunch.    . warfarin (COUMADIN) 5 MG tablet  TAKE 1 & 1/2 (ONE & ONE-HALF) TABLETS BY MOUTH ONCE DAILY AS DIRECTED 45 tablet 0  . carvedilol (COREG) 25 MG tablet Take 1 tablet (25 mg total) by mouth 2 (two) times daily. 180 tablet 3  . hydrALAZINE (APRESOLINE) 100 MG tablet Take 1 tablet by mouth 3 (three) times daily.      No current facility-administered medications on file prior to visit.    Observations/Objective: Vitals:   10/26/19 1119  Pulse: 74  SpO2: 93%  pt is in nad    Assessment and Plan: 1. Dyspepsia Avoid acidic foods ,  Avoid spicy foods and caffeine, peppermint etc protonix per orders   - pantoprazole (PROTONIX) 40 MG tablet; Take 1 tablet (40 mg total) by mouth daily.  Dispense: 30 tablet; Refill: 3 - Ambulatory referral to Gastroenterology  2. Dysphagia, unspecified type ? Stricture----  If protonix does not work pt should see GI  She has a f/u with  pcp beg nov  - pantoprazole (PROTONIX) 40 MG tablet; Take 1 tablet (40 mg total) by mouth daily.  Dispense: 30 tablet; Refill: 3 - Ambulatory referral to Gastroenterology   Follow Up Instructions:    I discussed the assessment and treatment plan with the patient. The patient was provided an opportunity to ask questions and all were answered. The patient agreed with the plan and demonstrated an understanding of the instructions.   The patient was advised to call back or seek an in-person evaluation if the symptoms worsen or if the condition fails to improve as anticipated.  I provided 25 minutes of non-face-to-face time during this encounter.   Ann Held, DO

## 2019-10-30 DIAGNOSIS — Z7901 Long term (current) use of anticoagulants: Secondary | ICD-10-CM | POA: Diagnosis not present

## 2019-11-01 DIAGNOSIS — Z7901 Long term (current) use of anticoagulants: Secondary | ICD-10-CM | POA: Diagnosis not present

## 2019-11-02 DIAGNOSIS — Z992 Dependence on renal dialysis: Secondary | ICD-10-CM | POA: Diagnosis not present

## 2019-11-02 DIAGNOSIS — N186 End stage renal disease: Secondary | ICD-10-CM | POA: Diagnosis not present

## 2019-11-03 DIAGNOSIS — N2581 Secondary hyperparathyroidism of renal origin: Secondary | ICD-10-CM | POA: Diagnosis not present

## 2019-11-03 DIAGNOSIS — D631 Anemia in chronic kidney disease: Secondary | ICD-10-CM | POA: Diagnosis not present

## 2019-11-03 DIAGNOSIS — D509 Iron deficiency anemia, unspecified: Secondary | ICD-10-CM | POA: Diagnosis not present

## 2019-11-03 DIAGNOSIS — I10 Essential (primary) hypertension: Secondary | ICD-10-CM | POA: Diagnosis not present

## 2019-11-03 DIAGNOSIS — N186 End stage renal disease: Secondary | ICD-10-CM | POA: Diagnosis not present

## 2019-11-03 DIAGNOSIS — Z23 Encounter for immunization: Secondary | ICD-10-CM | POA: Diagnosis not present

## 2019-11-06 DIAGNOSIS — Z7901 Long term (current) use of anticoagulants: Secondary | ICD-10-CM | POA: Diagnosis not present

## 2019-11-08 DIAGNOSIS — I10 Essential (primary) hypertension: Secondary | ICD-10-CM | POA: Diagnosis not present

## 2019-11-08 LAB — LIPID PANEL
HDL: 84 — AB (ref 35–70)
LDL Cholesterol: 30
LDl/HDL Ratio: 2

## 2019-11-13 DIAGNOSIS — Z7901 Long term (current) use of anticoagulants: Secondary | ICD-10-CM | POA: Diagnosis not present

## 2019-11-20 DIAGNOSIS — Z7901 Long term (current) use of anticoagulants: Secondary | ICD-10-CM | POA: Diagnosis not present

## 2019-11-21 DIAGNOSIS — Z79899 Other long term (current) drug therapy: Secondary | ICD-10-CM | POA: Diagnosis not present

## 2019-11-21 DIAGNOSIS — Z4901 Encounter for fitting and adjustment of extracorporeal dialysis catheter: Secondary | ICD-10-CM | POA: Diagnosis not present

## 2019-11-21 DIAGNOSIS — Z882 Allergy status to sulfonamides status: Secondary | ICD-10-CM | POA: Diagnosis not present

## 2019-11-21 DIAGNOSIS — N186 End stage renal disease: Secondary | ICD-10-CM | POA: Diagnosis not present

## 2019-11-21 DIAGNOSIS — Z992 Dependence on renal dialysis: Secondary | ICD-10-CM | POA: Diagnosis not present

## 2019-11-21 DIAGNOSIS — E785 Hyperlipidemia, unspecified: Secondary | ICD-10-CM | POA: Diagnosis not present

## 2019-11-21 DIAGNOSIS — I120 Hypertensive chronic kidney disease with stage 5 chronic kidney disease or end stage renal disease: Secondary | ICD-10-CM | POA: Diagnosis not present

## 2019-11-21 DIAGNOSIS — I6523 Occlusion and stenosis of bilateral carotid arteries: Secondary | ICD-10-CM | POA: Diagnosis not present

## 2019-11-21 DIAGNOSIS — Z7982 Long term (current) use of aspirin: Secondary | ICD-10-CM | POA: Diagnosis not present

## 2019-11-21 DIAGNOSIS — Z888 Allergy status to other drugs, medicaments and biological substances status: Secondary | ICD-10-CM | POA: Diagnosis not present

## 2019-11-22 DIAGNOSIS — Z7901 Long term (current) use of anticoagulants: Secondary | ICD-10-CM | POA: Diagnosis not present

## 2019-11-27 DIAGNOSIS — Z7901 Long term (current) use of anticoagulants: Secondary | ICD-10-CM | POA: Diagnosis not present

## 2019-12-03 ENCOUNTER — Emergency Department (HOSPITAL_BASED_OUTPATIENT_CLINIC_OR_DEPARTMENT_OTHER)
Admission: EM | Admit: 2019-12-03 | Discharge: 2019-12-03 | Disposition: A | Discharge status: Home / Self Care | Payer: Medicare HMO | Attending: Emergency Medicine | Admitting: Emergency Medicine

## 2019-12-03 ENCOUNTER — Encounter (HOSPITAL_BASED_OUTPATIENT_CLINIC_OR_DEPARTMENT_OTHER): Payer: Self-pay | Admitting: Emergency Medicine

## 2019-12-03 ENCOUNTER — Emergency Department (HOSPITAL_BASED_OUTPATIENT_CLINIC_OR_DEPARTMENT_OTHER): Payer: Medicare HMO

## 2019-12-03 ENCOUNTER — Other Ambulatory Visit: Payer: Self-pay

## 2019-12-03 DIAGNOSIS — Z79899 Other long term (current) drug therapy: Secondary | ICD-10-CM | POA: Diagnosis not present

## 2019-12-03 DIAGNOSIS — N186 End stage renal disease: Secondary | ICD-10-CM | POA: Insufficient documentation

## 2019-12-03 DIAGNOSIS — I120 Hypertensive chronic kidney disease with stage 5 chronic kidney disease or end stage renal disease: Secondary | ICD-10-CM | POA: Insufficient documentation

## 2019-12-03 DIAGNOSIS — Z7901 Long term (current) use of anticoagulants: Secondary | ICD-10-CM | POA: Diagnosis not present

## 2019-12-03 DIAGNOSIS — Z992 Dependence on renal dialysis: Secondary | ICD-10-CM | POA: Insufficient documentation

## 2019-12-03 DIAGNOSIS — N3090 Cystitis, unspecified without hematuria: Secondary | ICD-10-CM | POA: Diagnosis not present

## 2019-12-03 DIAGNOSIS — R310 Gross hematuria: Secondary | ICD-10-CM | POA: Diagnosis not present

## 2019-12-03 DIAGNOSIS — N281 Cyst of kidney, acquired: Secondary | ICD-10-CM | POA: Diagnosis not present

## 2019-12-03 DIAGNOSIS — R300 Dysuria: Secondary | ICD-10-CM | POA: Diagnosis present

## 2019-12-03 DIAGNOSIS — R319 Hematuria, unspecified: Secondary | ICD-10-CM | POA: Diagnosis not present

## 2019-12-03 DIAGNOSIS — I700 Atherosclerosis of aorta: Secondary | ICD-10-CM | POA: Diagnosis not present

## 2019-12-03 LAB — CBC WITH DIFFERENTIAL/PLATELET
Abs Immature Granulocytes: 0.09 10*3/uL — ABNORMAL HIGH (ref 0.00–0.07)
Basophils Absolute: 0.1 10*3/uL (ref 0.0–0.1)
Basophils Relative: 1 %
Eosinophils Absolute: 0.2 10*3/uL (ref 0.0–0.5)
Eosinophils Relative: 2 %
HCT: 40.2 % (ref 36.0–46.0)
Hemoglobin: 12.5 g/dL (ref 12.0–15.0)
Immature Granulocytes: 1 %
Lymphocytes Relative: 15 %
Lymphs Abs: 1.2 10*3/uL (ref 0.7–4.0)
MCH: 31 pg (ref 26.0–34.0)
MCHC: 31.1 g/dL (ref 30.0–36.0)
MCV: 99.8 fL (ref 80.0–100.0)
Monocytes Absolute: 0.8 10*3/uL (ref 0.1–1.0)
Monocytes Relative: 10 %
Neutro Abs: 5.7 10*3/uL (ref 1.7–7.7)
Neutrophils Relative %: 71 %
Platelets: 176 10*3/uL (ref 150–400)
RBC: 4.03 MIL/uL (ref 3.87–5.11)
RDW: 16.4 % — ABNORMAL HIGH (ref 11.5–15.5)
WBC: 8 10*3/uL (ref 4.0–10.5)
nRBC: 0 % (ref 0.0–0.2)

## 2019-12-03 LAB — BASIC METABOLIC PANEL
Anion gap: 13 (ref 5–15)
BUN: 51 mg/dL — ABNORMAL HIGH (ref 8–23)
CO2: 25 mmol/L (ref 22–32)
Calcium: 9.3 mg/dL (ref 8.9–10.3)
Chloride: 99 mmol/L (ref 98–111)
Creatinine, Ser: 5.17 mg/dL — ABNORMAL HIGH (ref 0.44–1.00)
GFR, Estimated: 8 mL/min — ABNORMAL LOW (ref >60)
Glucose, Bld: 105 mg/dL — ABNORMAL HIGH (ref 70–99)
Potassium: 4.4 mmol/L (ref 3.5–5.1)
Sodium: 137 mmol/L (ref 135–145)

## 2019-12-03 LAB — URINALYSIS, ROUTINE W REFLEX MICROSCOPIC

## 2019-12-03 LAB — URINALYSIS, MICROSCOPIC (REFLEX): RBC / HPF: >50 RBC/hpf (ref 0–5)

## 2019-12-03 MED ORDER — CEPHALEXIN 250 MG PO CAPS
500.0000 mg | ORAL_CAPSULE | Freq: Once | ORAL | Status: AC
  Administered 2019-12-03: 500 mg via ORAL
  Filled 2019-12-03: qty 2

## 2019-12-03 MED ORDER — ACETAMINOPHEN 500 MG PO TABS
1000.0000 mg | ORAL_TABLET | Freq: Once | ORAL | Status: AC
  Administered 2019-12-03: 1000 mg via ORAL
  Filled 2019-12-03: qty 2

## 2019-12-03 MED ORDER — CEPHALEXIN 500 MG PO CAPS: 500.0000 mg | ORAL_CAPSULE | Freq: Four times a day (QID) | ORAL | 0 refills | Status: DC

## 2019-12-03 NOTE — ED Provider Notes (Signed)
[facility] [facility] EMERGENCY DEPARTMENT Provider Note   CSN: [id number] Arrival date & time: 12/03/19  0401     History Chief Complaint  Patient presents with  . Dysuria    Jillian Hayes is a 74 y.o. female.  The history is provided by the patient.  Dysuria Pain quality:  Burning (pressure ) Pain severity:  Moderate Onset quality:  Gradual Timing:  Constant Progression:  Unchanged Chronicity:  New Recent urinary tract infections: yes   Relieved by:  Nothing Worsened by:  Nothing Ineffective treatments:  None tried Urinary symptoms: hematuria   Urinary symptoms comment:  Frequency  Associated symptoms: no abdominal pain, no fever, no nausea and no vomiting   Risk factors: renal disease   Risk factors: no hx of pyelonephritis        Past Medical History:  Diagnosis Date  . Advanced care planning/counseling discussion 06/10/2014   05/31/2014 patient presents copy of HCP and Living Will  . Anemia    iron deficiency  . Anxiety   . Atrial fibrillation ([place])   . Benign fundic gland polyps of stomach   . Bladder polyps 06/25/2010  . Chronic headaches 06/24/2010  . Chronic renal insufficiency, stage 4 (severe) ([place]) 11/09/2015  . Depression 1991   hospitalized  . History of chicken pox 06/25/2010  . Hypercalcemia 02/18/2014  . Hypertension   . Insomnia 06/24/2010  . Multiple chemical sensitivity syndrome 06/25/2010  . Proteinuria 02/18/2014  . Renal insufficiency 03/26/2011  . Valvular heart disease 04/28/2016    Patient Active Problem List   Diagnosis Date Noted  . Nonrheumatic mitral valve regurgitation 06/12/2019  . Edema 04/09/2019  . Drug-induced constipation 10/18/2018  . External prolapsed hemorrhoids 09/25/2018  . Leg swelling 03/03/2018  . Long term (current) use of anticoagulants 11/01/2017  . Atrial fibrillation ([place]) 10/25/2017  . DDD (degenerative disc disease), lumbar 04/07/2017  . Hearing loss 01/04/2017  . Hyperlipidemia 01/04/2017  . Arthritis  of fingers of both hands 10/01/2016  . Chronic rhinitis 10/01/2016  . Nonrheumatic aortic valve insufficiency 09/29/2016  . Bruit 09/29/2016  . Palpitation 09/22/2016  . Aortic valve disease 04/28/2016  . Anemia of chronic renal failure, stage 4 (severe) ([place]) 03/03/2016  . Sun-damaged skin 01/21/2016  . ESRD (end stage renal disease) ([place]) 11/09/2015  . Advanced care planning/counseling discussion 06/10/2014  . Pedal edema 03/28/2014  . Proteinuria 02/18/2014  . Hypercalcemia 02/18/2014  . Abnormal EKG 01/22/2014  . HTN (hypertension) 01/22/2014  . Dermatitis 06/30/2013  . Abnormal thyroid function test 06/25/2013  . Cervical cancer screening 06/10/2012  . Medicare annual wellness visit, subsequent 11/09/2011  . Multiple chemical sensitivity syndrome 06/25/2010  . History of chicken pox 06/25/2010  . Bladder polyps 06/25/2010  . History of cardiac dysrhythmia 06/25/2010  . Insomnia 06/24/2010  . Overweight 06/24/2010  . Fatigue 06/24/2010  . Chronic headaches 06/24/2010  . Allergy   . Anemia   . Depression     Past Surgical History:  Procedure Laterality Date  . COLONOSCOPY  2014  . cyst on left breast removed Left 1991   benign  . ESOPHAGOGASTRODUODENOSCOPY (EGD) WITH ESOPHAGEAL DILATION  2014  . NASAL SEPTUM SURGERY  1986   rhinoplasty  . polyps on bladder removed  1972   benign  . TONSILLECTOMY  1962     OB History   No obstetric history on file.     Family History  Problem Relation Age of Onset  . Breast cancer Mother 29  left breast removed, 2010 lung  . Diabetes Mother   . Nephrolithiasis Father   . Heart attack Father 57       X 3  . Heart disease Father        smoker  . Hypertension Brother   . Allergic rhinitis Brother   . Melanoma Son 37       melanoma on leg removed  . Pancreatic cancer Maternal Grandmother   . Hypertension Paternal Grandmother   . Obesity Paternal Grandmother   . Heart attack Paternal Grandfather 37  . Diabetes  Maternal Aunt        x 2  . Diabetes Maternal Uncle        x 3  . Asthma Neg Hx   . Eczema Neg Hx   . Urticaria Neg Hx   . Immunodeficiency Neg Hx   . Angioedema Neg Hx     Social History   Tobacco Use  . Smoking status: Never Smoker  . Smokeless tobacco: Never Used  Vaping Use  . Vaping Use: Never used  Substance Use Topics  . Alcohol use: No  . Drug use: No    Home Medications Prior to Admission medications   Medication Sig Start Date End Date Taking? Authorizing Provider  acetaminophen (TYLENOL) 500 MG tablet Take 500 mg by mouth every 6 (six) hours as needed for headache (pain).    [provider]  Alfalfa 250 MG TABS Take 1,500 mg by mouth 3 (three) times daily.    [provider]  atorvastatin (LIPITOR) 20 MG tablet Take 20 mg by mouth daily.    [provider]  b complex vitamins capsule Take 1 capsule by mouth 2 (two) times daily with a meal.     [provider]  carvedilol (COREG) 25 MG tablet Take 1 tablet (25 mg total) by mouth 2 (two) times daily. 04/11/19 07/10/19  Minus Breeding, MD  cephALEXin (KEFLEX) 500 MG capsule 1 caps po bid x 7 days 10/21/19   Zenas Santa, MD  Cholecalciferol (VITAMIN D PO) Take by mouth daily.    [provider]  cinacalcet (SENSIPAR) 30 MG tablet Take by mouth.    [provider]  diltiazem (CARDIZEM CD) 240 MG 24 hr capsule Take 240 mg by mouth daily.    [provider]  Flaxseed, Linseed, (FLAX SEED OIL PO) Take 1 capsule 2 (two) times daily by mouth.     [provider]  hydrALAZINE (APRESOLINE) 100 MG tablet Take 1 tablet by mouth 3 (three) times daily.  09/06/18 01/10/19  [provider]  IRON PO Take 15 mg by mouth daily. [place] product    [provider]  L-GLUTAMINE PO Take 1 capsule by mouth daily after supper.     [provider]  losartan (COZAAR) 50 MG tablet Take 1 tablet (50 mg total) by mouth in the morning and at bedtime.  04/11/19   Minus Breeding, MD  multivitamin (RENA-VIT) TABS tablet Take 1 tablet by mouth daily.    [provider]  pantoprazole (PROTONIX) 40 MG tablet Take 1 tablet (40 mg total) by mouth daily. 10/26/19   Ann Held, DO  Probiotic Product (PROBIOTIC DAILY PO) Take 1 tablet by mouth daily with lunch.     [provider]  sevelamer carbonate (RENVELA) 800 MG tablet Take 1,600 mg by mouth 3 (three) times daily. 09/07/18   [provider]  vitamin C (ASCORBIC ACID) 500 MG tablet Take 500 mg  by mouth daily as needed (immune system boost).     [provider]  VITAMIN E PO Take 1 capsule by mouth daily with lunch.    [provider]  warfarin (COUMADIN) 5 MG tablet TAKE 1 & 1/2 (ONE & ONE-HALF) TABLETS BY MOUTH ONCE DAILY AS DIRECTED 12/14/18   Minus Breeding, MD    Allergies    Sulfa antibiotics and Clonidine derivatives  Review of Systems   Review of Systems  Constitutional: Negative for fever.  HENT: Negative for congestion.   Eyes: Negative for visual disturbance.  Respiratory: Negative for shortness of breath.   Cardiovascular: Negative for chest pain.  Gastrointestinal: Negative for abdominal pain, nausea and vomiting.  Genitourinary: Positive for dysuria, frequency, hematuria and urgency.  Musculoskeletal: Negative for arthralgias.  Neurological: Negative for dizziness.  Psychiatric/Behavioral: Negative for agitation.  All other systems reviewed and are negative.   Physical Exam Updated Vital Signs BP (!) 161/70   Pulse 88   Temp 97.9 F (36.6 C)   Resp 16   Ht 5\' 7"  (1.702 m)   Wt 61 kg   SpO2 100%   BMI 21.07 kg/m   Physical Exam Vitals and nursing note reviewed.  Constitutional:      General: She is not in acute distress.    Appearance: Normal appearance.  HENT:     Head: Normocephalic and atraumatic.     Nose: Nose normal.  Eyes:     Conjunctiva/sclera: Conjunctivae normal.     Pupils: Pupils are equal,  round, and reactive to light.  Cardiovascular:     Rate and Rhythm: Normal rate and regular rhythm.     Pulses: Normal pulses.     Heart sounds: Normal heart sounds.  Pulmonary:     Effort: Pulmonary effort is normal.     Breath sounds: Normal breath sounds.  Abdominal:     General: Abdomen is flat. Bowel sounds are normal.     Palpations: Abdomen is soft.     Tenderness: There is no abdominal tenderness. There is no guarding.  Musculoskeletal:        General: Normal range of motion.     Cervical back: Normal range of motion and neck supple.  Skin:    General: Skin is warm and dry.     Capillary Refill: Capillary refill takes less than 2 seconds.  Neurological:     General: No focal deficit present.     Mental Status: She is alert and oriented to person, place, and time.  Psychiatric:        Mood and Affect: Mood normal.        Behavior: Behavior normal.     ED Results / Procedures / Treatments   Labs (all labs ordered are listed, but only abnormal results are displayed) Labs Reviewed  BASIC METABOLIC PANEL - Abnormal; Notable for the following components:      Result Value   Glucose, Bld 105 (*)    BUN 51 (*)    Creatinine, Ser 5.17 (*)    GFR, Estimated 8 (*)    All other components within normal limits  URINALYSIS, ROUTINE W REFLEX MICROSCOPIC - Abnormal; Notable for the following components:   Color, Urine RED (*)    APPearance TURBID (*)    Glucose, UA   (*)    Value: TEST NOT REPORTED DUE TO COLOR INTERFERENCE OF URINE PIGMENT   Hgb urine dipstick   (*)    Value: TEST NOT REPORTED DUE TO COLOR INTERFERENCE  OF URINE PIGMENT   Bilirubin Urine   (*)    Value: TEST NOT REPORTED DUE TO COLOR INTERFERENCE OF URINE PIGMENT   Ketones, ur   (*)    Value: TEST NOT REPORTED DUE TO COLOR INTERFERENCE OF URINE PIGMENT   Protein, ur   (*)    Value: TEST NOT REPORTED DUE TO COLOR INTERFERENCE OF URINE PIGMENT   Nitrite   (*)    Value: TEST NOT REPORTED DUE TO COLOR  INTERFERENCE OF URINE PIGMENT   Leukocytes,Ua   (*)    Value: TEST NOT REPORTED DUE TO COLOR INTERFERENCE OF URINE PIGMENT   All other components within normal limits  URINALYSIS, MICROSCOPIC (REFLEX) - Abnormal; Notable for the following components:   Bacteria, UA FEW (*)    All other components within normal limits  CBC WITH DIFFERENTIAL/PLATELET - Abnormal; Notable for the following components:   RDW 16.4 (*)    Abs Immature Granulocytes 0.09 (*)    All other components within normal limits  URINE CULTURE    EKG None  Radiology CT Renal Stone Study  Result Date: 12/03/2019 CLINICAL DATA:  Hematuria EXAM: CT ABDOMEN AND PELVIS WITHOUT CONTRAST TECHNIQUE: Multidetector CT imaging of the abdomen and pelvis was performed following the standard protocol without IV contrast. COMPARISON:  None. FINDINGS: LOWER CHEST: Normal. HEPATOBILIARY: Scattered hypodense foci, too small to characterize accurately, but most likely hepatic cysts. Otherwise normal liver. No biliary dilatation. There is cholelithiasis without acute inflammation. PANCREAS: Normal pancreas. No ductal dilatation or peripancreatic fluid collection. SPLEEN: Normal. ADRENALS/URINARY TRACT: The adrenal glands are normal. No hydronephrosis, nephroureterolithiasis or solid renal mass. Unchanged 4.7 cm right lower pole renal cyst. The urinary bladder is normal for degree of distention STOMACH/BOWEL: There is no hiatal hernia. Normal duodenal course and caliber. No small bowel dilatation or inflammation. No focal colonic abnormality. Normal appendix. VASCULAR/LYMPHATIC: There is calcific atherosclerosis of the abdominal aorta. No lymphadenopathy. REPRODUCTIVE: Normal uterus. No adnexal mass. MUSCULOSKELETAL. Multilevel degenerative disc disease and facet arthrosis. No bony spinal canal stenosis. OTHER: None. IMPRESSION: 1. No acute abnormality of the abdomen or pelvis. Aortic Atherosclerosis (ICD10-I70.0). Electronically Signed   By: Ulyses Jarred M.D.   On: 12/03/2019 05:54    Procedures Procedures (including critical care time)  Medications Ordered in ED Medications - No data to display  ED Course  I have reviewed the triage vital signs and the nursing notes.  Pertinent labs & imaging results that were available during my care of the patient were reviewed by me and considered in my medical decision making (see chart for details).   Symptoms consistent with hemorrhagic cystitis, based on symptoms.  CT is negative for stones and bladder lesion/clot.  I have started antibiotics and referred to urology for ongoing care.  Strict return precautions given.    Jillian Hayes was evaluated in Emergency Department on 12/03/2019 for the symptoms described in the history of present illness. She was evaluated in the context of the global COVID-19 pandemic, which necessitated consideration that the patient might be at risk for infection with the SARS-CoV-2 virus that causes COVID-19. Institutional protocols and algorithms that pertain to the evaluation of patients at risk for COVID-19 are in a state of rapid change based on information released by regulatory bodies including the CDC and federal and state organizations. These policies and algorithms were followed during the patient's care in the ED.  Final Clinical Impression(s) / ED Diagnoses Final diagnoses:  Gross hematuria   Return for  intractable cough, coughing up blood,fevers >100.4 unrelieved by medication, shortness of breath, intractable vomiting, chest pain, shortness of breath, weakness,numbness, changes in speech, facial asymmetry,abdominal pain, passing out,Inability to tolerate liquids or food, cough, altered mental status or any concerns. No signs of systemic illness or infection. The patient is nontoxic-appearing on exam and vital signs are within normal limits.   I have reviewed the triage vital signs and the nursing notes. Pertinent labs &imaging results that  were available during my care of the patient were reviewed by me and considered in my medical decision making (see chart for details).After history, exam, and medical workup I feel the patient has beenappropriately medically screened and is safe for discharge home. Pertinent diagnoses were discussed with the patient. Patient was given return precautions.   Barby Colvard, MD 12/03/19 [phone]

## 2019-12-03 NOTE — ED Triage Notes (Signed)
Dysuria and urinating blood onset tonight.

## 2019-12-04 DIAGNOSIS — N2581 Secondary hyperparathyroidism of renal origin: Secondary | ICD-10-CM | POA: Diagnosis not present

## 2019-12-04 DIAGNOSIS — D631 Anemia in chronic kidney disease: Secondary | ICD-10-CM | POA: Diagnosis not present

## 2019-12-04 DIAGNOSIS — N186 End stage renal disease: Secondary | ICD-10-CM | POA: Diagnosis not present

## 2019-12-04 DIAGNOSIS — Z7901 Long term (current) use of anticoagulants: Secondary | ICD-10-CM | POA: Diagnosis not present

## 2019-12-05 LAB — URINE CULTURE
Culture: >=100000 — AB
Special Requests: NORMAL

## 2019-12-06 LAB — HEPATIC FUNCTION PANEL
ALT: 15 (ref 7–35)
AST: 24 (ref 13–35)

## 2019-12-06 LAB — CBC AND DIFFERENTIAL
HCT: 72 — AB (ref 36–46)
Hemoglobin: 12 (ref 12.0–16.0)
Platelets: 193 (ref 150–399)
WBC: 5.5

## 2019-12-06 LAB — IRON,TIBC AND FERRITIN PANEL
%SAT: 72
Ferritin: 811
Iron: 165

## 2019-12-06 LAB — LIPID PANEL
Cholesterol: 127 (ref 0–200)
Triglycerides: 126 (ref 40–160)

## 2019-12-06 LAB — BASIC METABOLIC PANEL
BUN: 59 — AB (ref 4–21)
CO2: 21 (ref 13–22)
Chloride: 103 (ref 99–108)
Creatinine: 5.7 — AB (ref <=1.1)
Glucose: 0
Potassium: 4.4 (ref 3.4–5.3)
Sodium: 139 (ref 137–147)

## 2019-12-06 LAB — COMPREHENSIVE METABOLIC PANEL
Albumin: 3.7 (ref 3.5–5.0)
Calcium: 8.7 (ref 8.7–10.7)

## 2019-12-07 ENCOUNTER — Ambulatory Visit (INDEPENDENT_AMBULATORY_CARE_PROVIDER_SITE_OTHER): Payer: Medicare HMO | Admitting: Family Medicine

## 2019-12-07 ENCOUNTER — Other Ambulatory Visit: Payer: Self-pay

## 2019-12-07 ENCOUNTER — Encounter: Payer: Self-pay | Admitting: Family Medicine

## 2019-12-07 ENCOUNTER — Telehealth: Payer: Self-pay | Admitting: *Deleted

## 2019-12-07 VITALS — BP 112/60 | HR 73 | Temp 97.9°F | Resp 16 | Ht 67.0 in | Wt 135.4 lb

## 2019-12-07 VITALS — BP 142/53 | HR 68 | Wt 135.0 lb

## 2019-12-07 DIAGNOSIS — N83201 Unspecified ovarian cyst, right side: Secondary | ICD-10-CM

## 2019-12-07 DIAGNOSIS — R319 Hematuria, unspecified: Secondary | ICD-10-CM | POA: Diagnosis not present

## 2019-12-07 DIAGNOSIS — R946 Abnormal results of thyroid function studies: Secondary | ICD-10-CM | POA: Diagnosis not present

## 2019-12-07 DIAGNOSIS — I10 Essential (primary) hypertension: Secondary | ICD-10-CM

## 2019-12-07 DIAGNOSIS — N83202 Unspecified ovarian cyst, left side: Secondary | ICD-10-CM

## 2019-12-07 DIAGNOSIS — N186 End stage renal disease: Secondary | ICD-10-CM | POA: Diagnosis not present

## 2019-12-07 DIAGNOSIS — I480 Paroxysmal atrial fibrillation: Secondary | ICD-10-CM | POA: Diagnosis not present

## 2019-12-07 DIAGNOSIS — Z0000 Encounter for general adult medical examination without abnormal findings: Secondary | ICD-10-CM

## 2019-12-07 DIAGNOSIS — N390 Urinary tract infection, site not specified: Secondary | ICD-10-CM | POA: Diagnosis not present

## 2019-12-07 DIAGNOSIS — E782 Mixed hyperlipidemia: Secondary | ICD-10-CM

## 2019-12-07 DIAGNOSIS — R9389 Abnormal findings on diagnostic imaging of other specified body structures: Secondary | ICD-10-CM

## 2019-12-07 MED ORDER — CEPHALEXIN 500 MG PO CAPS
500.0000 mg | ORAL_CAPSULE | Freq: Four times a day (QID) | ORAL | 0 refills | Status: DC
Start: 2019-12-07 — End: 2020-01-10

## 2019-12-07 MED ORDER — LOSARTAN POTASSIUM 25 MG PO TABS: 25.0000 mg | ORAL_TABLET | Freq: Two times a day (BID) | ORAL | 1 refills | Status: DC

## 2019-12-07 NOTE — Telephone Encounter (Signed)
Post ED Visit - Positive Culture Follow-up  Culture report reviewed by antimicrobial stewardship pharmacist: [place] Team []  Elenor Quinones, Pharm.D. []  Heide Guile, Pharm.D., BCPS AQ-ID []  Parks Neptune, Pharm.D., BCPS []  Alycia Rossetti, Pharm.D., BCPS []  [place], [place].D., BCPS, AAHIVP []  Legrand Como, Pharm.D., BCPS, AAHIVP []  Salome Arnt, PharmD, BCPS []  Johnnette Gourd, PharmD, BCPS []  Hughes Better, PharmD, BCPS []  Leeroy Cha, PharmD []  Laqueta Linden, PharmD, BCPS []  Albertina Parr, PharmD  [facility] Team []  Leodis Sias, PharmD []  Lindell Spar, PharmD []  Royetta Asal, PharmD []  Graylin Shiver, Rph []  Rema Fendt) Glennon Mac, PharmD []  Arlyn Dunning, PharmD []  Netta Cedars, PharmD []  Dia Sitter, PharmD []  Leone Haven, PharmD []  Gretta Arab, PharmD []  Theodis Shove, PharmD []  Peggyann Juba, PharmD []  Reuel Boom, PharmD   Positive urine culture Treated with Cephalexin, organism sensitive to the same and no further patient follow-up is required at this time. Wilson Singer, PharmD  Harlon Flor Talley 12/07/2019, 10:48 AM

## 2019-12-07 NOTE — Patient Instructions (Signed)
Shingrix is the new shingles shot 2 shots over 2-6 months at [place] 74 Years and Older, Female Preventive care refers to lifestyle choices and visits with your health care provider that can promote health and wellness. This includes:  A yearly physical exam. This is also called an annual well check.  Regular dental and eye exams.  Immunizations.  Screening for certain conditions.  Healthy lifestyle choices, such as diet and exercise. What can I expect for my preventive care visit? Physical exam Your health care provider will check:  Height and weight. These may be used to calculate body mass index (BMI), which is a measurement that tells if you are at a healthy weight.  Heart rate and blood pressure.  Your skin for abnormal spots. Counseling Your health care provider may ask you questions about:  Alcohol, tobacco, and drug use.  Emotional well-being.  Home and relationship well-being.  Sexual activity.  Eating habits.  History of falls.  Memory and ability to understand (cognition).  Work and work Statistician.  Pregnancy and menstrual history. What immunizations do I need?  Influenza (flu) vaccine  This is recommended every year. Tetanus, diphtheria, and pertussis (Tdap) vaccine  You may need a Td booster every 10 years. Varicella (chickenpox) vaccine  You may need this vaccine if you have not already been vaccinated. Zoster (shingles) vaccine  You may need this after age 45. Pneumococcal conjugate (PCV13) vaccine  One dose is recommended after age 74. Pneumococcal polysaccharide (PPSV23) vaccine  One dose is recommended after age 19. Measles, mumps, and rubella (MMR) vaccine  You may need at least one dose of MMR if you were born in 1957 or later. You may also need a second dose. Meningococcal conjugate (MenACWY) vaccine  You may need this if you have certain conditions. Hepatitis A vaccine  You may need this if you have certain  conditions or if you travel or work in places where you may be exposed to hepatitis A. Hepatitis B vaccine  You may need this if you have certain conditions or if you travel or work in places where you may be exposed to hepatitis B. Haemophilus influenzae type b (Hib) vaccine  You may need this if you have certain conditions. You may receive vaccines as individual doses or as more than one vaccine together in one shot (combination vaccines). Talk with your health care provider about the risks and benefits of combination vaccines. What tests do I need? Blood tests  Lipid and cholesterol levels. These may be checked every 5 years, or more frequently depending on your overall health.  Hepatitis C test.  Hepatitis B test. Screening  Lung cancer screening. You may have this screening every year starting at age 74 if you have a 30-pack-year history of smoking and currently smoke or have quit within the past 15 years.  Colorectal cancer screening. All adults should have this screening starting at age 74 and continuing until age 76. Your health care provider may recommend screening at age 40 if you are at increased risk. You will have tests every 1-10 years, depending on your results and the type of screening test.  Diabetes screening. This is done by checking your blood sugar (glucose) after you have not eaten for a while (fasting). You may have this done every 1-3 years.  Mammogram. This may be done every 1-2 years. Talk with your health care provider about how often you should have regular mammograms.  BRCA-related cancer screening. This may be done  if you have a family history of breast, ovarian, tubal, or peritoneal cancers. Other tests  Sexually transmitted disease (STD) testing.  Bone density scan. This is done to screen for osteoporosis. You may have this done starting at age 23. Follow these instructions at home: Eating and drinking  Eat a diet that includes fresh fruits and  vegetables, whole grains, lean protein, and low-fat dairy products. Limit your intake of foods with high amounts of sugar, saturated fats, and salt.  Take vitamin and mineral supplements as recommended by your health care provider.  Do not drink alcohol if your health care provider tells you not to drink.  If you drink alcohol: ? Limit how much you have to 0-1 drink a day. ? Be aware of how much alcohol is in your drink. In the [place] one drink equals one 12 oz bottle of beer (355 mL), one 5 oz glass of wine (148 mL), or one 1 oz glass of hard liquor (44 mL). Lifestyle  Take daily care of your teeth and gums.  Stay active. Exercise for at least 30 minutes on 5 or more days each week.  Do not use any products that contain nicotine or tobacco, such as cigarettes, e-cigarettes, and chewing tobacco. If you need help quitting, ask your health care provider.  If you are sexually active, practice safe sex. Use a condom or other form of protection in order to prevent STIs (sexually transmitted infections).  Talk with your health care provider about taking a low-dose aspirin or statin. What's next?  Go to your health care provider once a year for a well check visit.  Ask your health care provider how often you should have your eyes and teeth checked.  Stay up to date on all vaccines. This information is not intended to replace advice given to you by your health care provider. Make sure you discuss any questions you have with your health care provider. Document Revised: 01/13/2018 Document Reviewed: 01/13/2018 Elsevier Patient Education  2020 [employer].

## 2019-12-07 NOTE — Progress Notes (Signed)
Subjective:    Patient ID: Jillian Hayes, female    DOB: [birth year], 74 y.o.   MRN: [id number]  HPI  This is a 74 yo G2P2002 who was referred to our office due to abnormal ultrasound the emergency room.  Patient presented with feeling of gassy and fullness.  Left adnexal enlargement.  Ultrasound was done showing bilateral ovary cysts, as well as thickened endometrium at 10 mm.  She is not having any pain or bleeding.  She went through menopause around the age of 10.  History of vaginal deliveries.  No pelvic surgery.  I have reviewed the patients past medical, family, and social history.  I have reviewed the patient's medication list and allergies.  Review of Systems     Objective:   Physical Exam Vitals reviewed.  Pulmonary:     Effort: Pulmonary effort is normal.     Breath sounds: Normal breath sounds.  Abdominal:     General: Abdomen is flat. There is no distension.     Palpations: Abdomen is soft. There is no mass.     Tenderness: There is no abdominal tenderness. There is no guarding or rebound.     Hernia: No hernia is present.  Skin:    Capillary Refill: Capillary refill takes less than 2 seconds.  Neurological:     General: No focal deficit present.  Psychiatric:        Mood and Affect: Mood normal.        Thought Content: Thought content normal.    Study Result  Narrative & Impression  CLINICAL DATA:  Left lower quadrant pain, abnormal adnexa on CT yesterday  EXAM: TRANSABDOMINAL AND TRANSVAGINAL ULTRASOUND OF PELVIS  DOPPLER ULTRASOUND OF OVARIES  TECHNIQUE: Both transabdominal and transvaginal ultrasound examinations of the pelvis were performed. Transabdominal technique was performed for global imaging of the pelvis including uterus, ovaries, adnexal regions, and pelvic cul-de-sac.  It was necessary to proceed with endovaginal exam following the transabdominal exam to visualize the ovaries and adnexa. Color and duplex Doppler ultrasound was  utilized to evaluate blood flow to the ovaries.  COMPARISON:  CT abdomen pelvis, 10/20/2019, outside pelvic ultrasound, 09/12/2019  FINDINGS: Uterus  Measurements: 6.0 x 3.0 x 4.3 cm = volume: 41 mL. No fibroids or other mass visualized. Nabothian cysts of the cervix.  Endometrium  Thickness: 10 mm.  No focal abnormality visualized.  Right ovary  Measurements: 4.0 x 3.4 x 3.5 cm = volume: 24 mL. Simple cyst measuring 4.3 cm. No internal Doppler flow.  Left ovary  Measurements: 3.9 x 4.0 x 3.9 cm = volume: 32 mL. Thinly septated cyst of the left ovary measuring 2.3 cm. No internal Doppler flow.  Pulsed Doppler evaluation of both ovaries demonstrates normal low-resistance arterial and venous waveforms.  Other findings  No abnormal free fluid.  IMPRESSION: 1. No definite ultrasound findings to explain left lower quadrant pain. 2. Arterial and venous Doppler flow is present to the bilateral ovaries. 3. There is a 4.3 cm simple cyst of the right ovary. Thinly septated cyst of the left ovary measuring 2.3 cm. These findings are almost certainly benign functional remnants. No further follow-up is required. This recommendation follows the consensus statement: Management of Asymptomatic Ovarian and Other Adnexal Cysts Imaged at [place]: Society of Radiologists in [place]. 4. The endometrium is slightly thickened, measuring 10 mm. This is of uncertain significance in the absence of abnormal vaginal bleeding in the late postmenopausal setting. Consider gynecologic referral and tissue sampling  or follow-up imaging to assess for stability.   Electronically Signed   By: Eddie Candle M.D.   On: 10/21/2019 15:32        Assessment & Plan:  1. Cysts of both ovaries We will check CA-125 and have patient follow-up with GYN surgery.  The cyst at 4.3 cm is in the upper limits and may need to be removed.  Additionally, the complex left  cyst is concerning.  If the patient does need surgery, she may be able to do both the surgery and endometrial sampling at the same time. - CA 125  2. Thickened endometrium Endometrium is 10 mm, which is abnormal for postmenopausal state.  Will need endometrial sampling, which may be done if she needs a cyst removal or oophorectomy.  3. ESRD (end stage renal disease) ([place]) Patient is on dialysis and undergoing evaluation for kidney transplant  4. Paroxysmal atrial fibrillation ([place]) Patient is on Coumadin  5. Abnormal thyroid function test On Synthroid  35 minutes spent in reviewing patient's chart, reviewing ultrasound report with the patient, discussing findings and significance of those findings, discussing plan of care.

## 2019-12-08 ENCOUNTER — Other Ambulatory Visit: Payer: Self-pay

## 2019-12-08 LAB — CA 125: Cancer Antigen (CA) 125: 22.3 U/mL (ref 0.0–38.1)

## 2019-12-09 DIAGNOSIS — N390 Urinary tract infection, site not specified: Secondary | ICD-10-CM | POA: Insufficient documentation

## 2019-12-09 NOTE — Progress Notes (Signed)
Subjective:    Patient ID: Jillian Hayes, female    DOB: [birth year], 74 y.o.   MRN: [id number]  Chief Complaint  Patient presents with  . Annual Exam    HPI Patient is in today for annual preventative exam and follow up on chronic medical concerns. She is awaiting news about whether she will quality for kidney transplant but she has been told by her team that she is likely a good candidate. She is still urinating but the amount is diminishing. She is able to drink 50 cc a day. She did have a painful UTI recently. Is on Keflex snd feeling better but she does not have an appointment with urology til next week. She reports she is eating well and staying as active as she can. Is tolerating dialysis. Denies CP/palp/SOB/HA/congestion/fevers/GI or GU c/o. Taking meds as prescribed  Past Medical History:  Diagnosis Date  . Advanced care planning/counseling discussion 06/10/2014   05/31/2014 patient presents copy of HCP and Living Will  . Anemia    iron deficiency  . Anxiety   . Atrial fibrillation ([place])   . Benign fundic gland polyps of stomach   . Bladder polyps 06/25/2010  . Chronic headaches 06/24/2010  . Chronic renal insufficiency, stage 4 (severe) ([place]) 11/09/2015  . Depression 1991   hospitalized  . History of chicken pox 06/25/2010  . Hypercalcemia 02/18/2014  . Hypertension   . Insomnia 06/24/2010  . Multiple chemical sensitivity syndrome 06/25/2010  . Proteinuria 02/18/2014  . Renal insufficiency 03/26/2011  . Valvular heart disease 04/28/2016    Past Surgical History:  Procedure Laterality Date  . COLONOSCOPY  2014  . cyst on left breast removed Left 1991   benign  . ESOPHAGOGASTRODUODENOSCOPY (EGD) WITH ESOPHAGEAL DILATION  2014  . NASAL SEPTUM SURGERY  1986   rhinoplasty  . polyps on bladder removed  1972   benign  . TONSILLECTOMY  1962    Family History  Problem Relation Age of Onset  . Breast cancer Mother 29       left breast removed, 2010 lung  . Diabetes Mother     . Nephrolithiasis Father   . Heart attack Father 76       X 3  . Heart disease Father        smoker  . Hypertension Brother   . Allergic rhinitis Brother   . Melanoma Son 37       melanoma on leg removed  . Pancreatic cancer Maternal Grandmother   . Hypertension Paternal Grandmother   . Obesity Paternal Grandmother   . Heart attack Paternal Grandfather 93  . Diabetes Maternal Aunt        x 2  . Diabetes Maternal Uncle        x 3  . Asthma Neg Hx   . Eczema Neg Hx   . Urticaria Neg Hx   . Immunodeficiency Neg Hx   . Angioedema Neg Hx     Social History   Socioeconomic History  . Marital status: Married    Spouse name: Not on file  . Number of children: 2  . Years of education: Not on file  . Highest education level: Not on file  Occupational History  . Occupation: retired  Tobacco Use  . Smoking status: Never Smoker  . Smokeless tobacco: Never Used  Vaping Use  . Vaping Use: Never used  Substance and Sexual Activity  . Alcohol use: No  . Drug use: No  . Sexual activity:  Yes    Partners: Male  Other Topics Concern  . Not on file  Social History Narrative   Married and lives with husband.     Retired   1 son and 1 daughter   No alcohol tobacco or caffeine   Social Determinants of Radio broadcast assistant Strain:   . Difficulty of Paying Living Expenses: Not on file  Food Insecurity:   . Worried About Charity fundraiser in the Last Year: Not on file  . Ran Out of Food in the Last Year: Not on file  Transportation Needs:   . Lack of Transportation (Medical): Not on file  . Lack of Transportation (Non-Medical): Not on file  Physical Activity:   . Days of Exercise per Week: Not on file  . Minutes of Exercise per Session: Not on file  Stress:   . Feeling of Stress : Not on file  Social Connections:   . Frequency of Communication with Friends and Family: Not on file  . Frequency of Social Gatherings with Friends and Family: Not on file  . Attends  Religious Services: Not on file  . Active Member of Clubs or Organizations: Not on file  . Attends Archivist Meetings: Not on file  . Marital Status: Not on file  Intimate Partner Violence:   . Fear of Current or Ex-Partner: Not on file  . Emotionally Abused: Not on file  . Physically Abused: Not on file  . Sexually Abused: Not on file    Outpatient Medications Prior to Visit  Medication Sig Dispense Refill  . acetaminophen (TYLENOL) 500 MG tablet Take 500 mg by mouth every 6 (six) hours as needed for headache (pain).    . Alfalfa 250 MG TABS Take 1,500 mg by mouth 3 (three) times daily.    Marland Kitchen atorvastatin (LIPITOR) 20 MG tablet Take 20 mg by mouth daily.    Marland Kitchen b complex vitamins capsule Take 1 capsule by mouth 2 (two) times daily with a meal.     . Cholecalciferol (VITAMIN D PO) Take by mouth daily.    . cinacalcet (SENSIPAR) 30 MG tablet Take by mouth.    . diltiazem (CARDIZEM CD) 240 MG 24 hr capsule Take 240 mg by mouth daily.    . Flaxseed, Linseed, (FLAX SEED OIL PO) Take 1 capsule 2 (two) times daily by mouth.     . IRON PO Take 15 mg by mouth daily. [place] product    . L-GLUTAMINE PO Take 1 capsule by mouth daily after supper.     . multivitamin (RENA-VIT) TABS tablet Take 1 tablet by mouth daily.    . pantoprazole (PROTONIX) 40 MG tablet Take 1 tablet (40 mg total) by mouth daily. 30 tablet 3  . Probiotic Product (PROBIOTIC DAILY PO) Take 1 tablet by mouth daily with lunch.     . sevelamer carbonate (RENVELA) 800 MG tablet Take 1,600 mg by mouth 3 (three) times daily.    . vitamin C (ASCORBIC ACID) 500 MG tablet Take 500 mg by mouth daily as needed (immune system boost).     Marland Kitchen VITAMIN E PO Take 1 capsule by mouth daily with lunch.    . warfarin (COUMADIN) 5 MG tablet TAKE 1 & 1/2 (ONE & ONE-HALF) TABLETS BY MOUTH ONCE DAILY AS DIRECTED 45 tablet 0  . cephALEXin (KEFLEX) 500 MG capsule 1 caps po bid x 7 days 14 capsule 0  . cephALEXin (KEFLEX) 500 MG capsule Take 1  capsule (500  mg total) by mouth 4 (four) times daily. 20 capsule 0  . losartan (COZAAR) 50 MG tablet Take 1 tablet (50 mg total) by mouth in the morning and at bedtime. 180 tablet 3  . carvedilol (COREG) 25 MG tablet Take 1 tablet (25 mg total) by mouth 2 (two) times daily. 180 tablet 3  . hydrALAZINE (APRESOLINE) 100 MG tablet Take 1 tablet by mouth 3 (three) times daily.      No facility-administered medications prior to visit.    Allergies  Allergen Reactions  . Sulfa Antibiotics Nausea And Vomiting  . Clonidine Derivatives     Review of Systems  Constitutional: Negative for fever and malaise/fatigue.  HENT: Negative for congestion, ear discharge and hearing loss.   Eyes: Negative for blurred vision and redness.  Respiratory: Negative for cough and shortness of breath.   Cardiovascular: Negative for chest pain, palpitations and leg swelling.  Gastrointestinal: Negative for abdominal pain, blood in stool and nausea.  Genitourinary: Positive for hematuria. Negative for dysuria and frequency.  Musculoskeletal: Negative for falls and myalgias.  Skin: Negative for rash.  Neurological: Negative for dizziness, focal weakness, loss of consciousness and headaches.  Endo/Heme/Allergies: Negative for environmental allergies.  Psychiatric/Behavioral: Negative for depression. The patient is not nervous/anxious.        Objective:    Physical Exam Constitutional:      General: She is not in acute distress.    Appearance: She is not diaphoretic.  HENT:     Head: Normocephalic and atraumatic.     Right Ear: External ear normal.     Left Ear: External ear normal.     Nose: Nose normal. No rhinorrhea.     Mouth/Throat:     Pharynx: No oropharyngeal exudate.  Eyes:     General: No scleral icterus.       Right eye: No discharge.        Left eye: No discharge.     Conjunctiva/sclera: Conjunctivae normal.     Pupils: Pupils are equal, round, and reactive to light.  Neck:     Thyroid: No  thyromegaly.  Cardiovascular:     Rate and Rhythm: Normal rate. Rhythm irregular.     Heart sounds: Normal heart sounds. No murmur heard.   Pulmonary:     Effort: Pulmonary effort is normal. No respiratory distress.     Breath sounds: Normal breath sounds. No wheezing or rales.  Abdominal:     General: Bowel sounds are normal. There is no distension.     Palpations: Abdomen is soft. There is no mass.     Tenderness: There is no abdominal tenderness.  Musculoskeletal:        General: No tenderness. Normal range of motion.     Cervical back: Normal range of motion and neck supple.  Lymphadenopathy:     Cervical: No cervical adenopathy.  Skin:    General: Skin is warm and dry.     Findings: No rash.  Neurological:     Mental Status: She is alert and oriented to person, place, and time.     Cranial Nerves: No cranial nerve deficit.     Coordination: Coordination normal.     Deep Tendon Reflexes: Reflexes are normal and symmetric. Reflexes normal.     BP 112/60   Pulse 73   Temp 97.9 F (36.6 C)   Resp 16   Ht 5' 7"  (1.702 m)   Wt 135 lb 6.4 oz (61.4 kg)   SpO2 95%  BMI 21.21 kg/m  Wt Readings from Last 3 Encounters:  12/07/19 135 lb (61.2 kg)  12/07/19 135 lb 6.4 oz (61.4 kg)  12/03/19 134 lb 8 oz (61 kg)    Diabetic Foot Exam - Simple   No data filed     Lab Results  Component Value Date   WBC 5.5 12/06/2019   HGB 12.0 12/06/2019   HCT 72 (A) 12/06/2019   PLT 193 12/06/2019   GLUCOSE 105 (H) 12/03/2019   CHOL 127 12/06/2019   TRIG 126 12/06/2019   HDL 84 (A) 11/08/2019   LDLCALC 30 11/08/2019   ALT 15 12/06/2019   AST 24 12/06/2019   NA 139 12/06/2019   K 4.4 12/06/2019   CL 103 12/06/2019   CREATININE 5.7 (A) 12/06/2019   BUN 59 (A) 12/06/2019   CO2 21 12/06/2019   TSH 0.25 (L) 09/20/2018   INR 2.4 11/17/2018   HGBA1C 5.7 (H) 10/25/2017    Lab Results  Component Value Date   TSH 0.25 (L) 09/20/2018   Lab Results  Component Value Date   WBC  5.5 12/06/2019   HGB 12.0 12/06/2019   HCT 72 (A) 12/06/2019   MCV 99.8 12/03/2019   PLT 193 12/06/2019   Lab Results  Component Value Date   NA 139 12/06/2019   K 4.4 12/06/2019   CHLORIDE 109 03/25/2016   CO2 21 12/06/2019   GLUCOSE 105 (H) 12/03/2019   BUN 59 (A) 12/06/2019   CREATININE 5.7 (A) 12/06/2019   BILITOT 0.7 10/20/2019   ALKPHOS 73 10/20/2019   AST 24 12/06/2019   ALT 15 12/06/2019   PROT 6.7 10/20/2019   ALBUMIN 3.7 12/06/2019   CALCIUM 8.7 12/06/2019   ANIONGAP 13 12/03/2019   EGFR 20 (L) 03/25/2016   GFR 11.58 (LL) 09/20/2018   Lab Results  Component Value Date   CHOL 127 12/06/2019   Lab Results  Component Value Date   HDL 84 (A) 11/08/2019   Lab Results  Component Value Date   LDLCALC 30 11/08/2019   Lab Results  Component Value Date   TRIG 126 12/06/2019   Lab Results  Component Value Date   CHOLHDL 2 09/20/2018   Lab Results  Component Value Date   HGBA1C 5.7 (H) 10/25/2017       Assessment & Plan:   Problem List Items Addressed This Visit    Preventative health care    Patient encouraged to maintain heart healthy diet, regular exercise, adequate sleep. Consider daily probiotics. Take medications as prescribed. Labs reviewed.       Abnormal thyroid function test    Continue to monitor      HTN (hypertension)    Well controlled today but she notes frequent episodes of symptomatic low blood pressures, will drop her Losartan to 25 mg bid and she will monitor will consider further changes as needed. Encouraged heart healthy diet such as the DASH diet and exercise as tolerated.       Relevant Medications   losartan (COZAAR) 25 MG tablet   ESRD (end stage renal disease) ([place])    Is tolerating Dialysis and she has been told there is a high likelihood she will be eligible for a kidney transplant, she should know more by next week.       Hyperlipidemia    Tolerating statin, encouraged heart healthy diet, avoid trans fats, minimize  simple carbs and saturated fats. Increase exercise as tolerated      Relevant Medications   losartan (  COZAAR) 25 MG tablet   Atrial fibrillation ([facility])    Rate controlled and tolerating Warfarin      Relevant Medications   losartan (COZAAR) 25 MG tablet   UTI (urinary tract infection)    She is currently on Keflex after presenting to ED with painful UTI with hematuria. She is feeling much better but her urology follow up is not til next week. Will extend her Keflex til she is seen by urology due to her fragile renal situation and how painful her UTI was      Relevant Medications   cephALEXin (KEFLEX) 500 MG capsule      I have discontinued Inez Catalina S. Vandervelden's losartan. I am also having her start on losartan. Additionally, I am having her maintain her vitamin C, (Flaxseed, Linseed, (FLAX SEED OIL PO)), b complex vitamins, L-GLUTAMINE PO, Probiotic Product (PROBIOTIC DAILY PO), Alfalfa, IRON PO, VITAMIN E PO, acetaminophen, Cholecalciferol (VITAMIN D PO), sevelamer carbonate, hydrALAZINE, warfarin, multivitamin, cinacalcet, carvedilol, atorvastatin, diltiazem, pantoprazole, and cephALEXin.  Meds ordered this encounter  Medications  . cephALEXin (KEFLEX) 500 MG capsule    Sig: Take 1 capsule (500 mg total) by mouth 4 (four) times daily.    Dispense:  20 capsule    Refill:  0  . losartan (COZAAR) 25 MG tablet    Sig: Take 1 tablet (25 mg total) by mouth in the morning and at bedtime.    Dispense:  180 tablet    Refill:  1     [name], MD

## 2019-12-09 NOTE — Assessment & Plan Note (Signed)
Tolerating statin, encouraged heart healthy diet, avoid trans fats, minimize simple carbs and saturated fats. Increase exercise as tolerated 

## 2019-12-09 NOTE — Assessment & Plan Note (Signed)
She is currently on Keflex after presenting to ED with painful UTI with hematuria. She is feeling much better but her urology follow up is not til next week. Will extend her Keflex til she is seen by urology due to her fragile renal situation and how painful her UTI was

## 2019-12-09 NOTE — Assessment & Plan Note (Signed)
Is tolerating Dialysis and she has been told there is a high likelihood she will be eligible for a kidney transplant, she should know more by next week.

## 2019-12-09 NOTE — Assessment & Plan Note (Signed)
Rate controlled and tolerating Warfarin.  

## 2019-12-09 NOTE — Assessment & Plan Note (Signed)
Continue to monitor

## 2019-12-09 NOTE — Assessment & Plan Note (Signed)
Patient encouraged to maintain heart healthy diet, regular exercise, adequate sleep. Consider daily probiotics. Take medications as prescribed. Labs reviewed 

## 2019-12-09 NOTE — Assessment & Plan Note (Addendum)
Well controlled today but she notes frequent episodes of symptomatic low blood pressures, will drop her Losartan to 25 mg bid and she will monitor will consider further changes as needed. Encouraged heart healthy diet such as the DASH diet and exercise as tolerated.

## 2019-12-11 DIAGNOSIS — Z7901 Long term (current) use of anticoagulants: Secondary | ICD-10-CM | POA: Diagnosis not present

## 2019-12-13 ENCOUNTER — Other Ambulatory Visit: Payer: Self-pay

## 2019-12-13 ENCOUNTER — Telehealth: Payer: Self-pay

## 2019-12-13 NOTE — Telephone Encounter (Signed)
Order faxed and confirmed to [facility]

## 2019-12-13 NOTE — Progress Notes (Unsigned)
Bone density order request for pt .order pending. Request from [facility] last exam was 10/14/2016

## 2019-12-18 DIAGNOSIS — Z7901 Long term (current) use of anticoagulants: Secondary | ICD-10-CM | POA: Diagnosis not present

## 2019-12-19 DIAGNOSIS — M21372 Foot drop, left foot: Secondary | ICD-10-CM | POA: Diagnosis not present

## 2019-12-19 DIAGNOSIS — M7732 Calcaneal spur, left foot: Secondary | ICD-10-CM | POA: Diagnosis not present

## 2019-12-19 DIAGNOSIS — M19072 Primary osteoarthritis, left ankle and foot: Secondary | ICD-10-CM | POA: Diagnosis not present

## 2019-12-19 DIAGNOSIS — M25572 Pain in left ankle and joints of left foot: Secondary | ICD-10-CM | POA: Diagnosis not present

## 2019-12-19 DIAGNOSIS — M79675 Pain in left toe(s): Secondary | ICD-10-CM | POA: Diagnosis not present

## 2019-12-19 DIAGNOSIS — Q688 Other specified congenital musculoskeletal deformities: Secondary | ICD-10-CM | POA: Diagnosis not present

## 2019-12-20 DIAGNOSIS — Z7901 Long term (current) use of anticoagulants: Secondary | ICD-10-CM | POA: Diagnosis not present

## 2019-12-24 DIAGNOSIS — Z7901 Long term (current) use of anticoagulants: Secondary | ICD-10-CM | POA: Diagnosis not present

## 2019-12-27 DIAGNOSIS — M21372 Foot drop, left foot: Secondary | ICD-10-CM | POA: Diagnosis not present

## 2019-12-30 DIAGNOSIS — N3000 Acute cystitis without hematuria: Secondary | ICD-10-CM | POA: Diagnosis not present

## 2019-12-30 DIAGNOSIS — R350 Frequency of micturition: Secondary | ICD-10-CM | POA: Diagnosis not present

## 2019-12-30 DIAGNOSIS — N898 Other specified noninflammatory disorders of vagina: Secondary | ICD-10-CM | POA: Diagnosis not present

## 2020-01-01 DIAGNOSIS — Z7901 Long term (current) use of anticoagulants: Secondary | ICD-10-CM | POA: Diagnosis not present

## 2020-01-02 DIAGNOSIS — N186 End stage renal disease: Secondary | ICD-10-CM | POA: Diagnosis not present

## 2020-01-02 DIAGNOSIS — Z992 Dependence on renal dialysis: Secondary | ICD-10-CM | POA: Diagnosis not present

## 2020-01-03 DIAGNOSIS — N2581 Secondary hyperparathyroidism of renal origin: Secondary | ICD-10-CM | POA: Diagnosis not present

## 2020-01-03 DIAGNOSIS — D631 Anemia in chronic kidney disease: Secondary | ICD-10-CM | POA: Diagnosis not present

## 2020-01-03 DIAGNOSIS — D509 Iron deficiency anemia, unspecified: Secondary | ICD-10-CM | POA: Diagnosis not present

## 2020-01-03 DIAGNOSIS — N186 End stage renal disease: Secondary | ICD-10-CM | POA: Diagnosis not present

## 2020-01-08 DIAGNOSIS — Z7901 Long term (current) use of anticoagulants: Secondary | ICD-10-CM | POA: Diagnosis not present

## 2020-01-10 ENCOUNTER — Ambulatory Visit (INDEPENDENT_AMBULATORY_CARE_PROVIDER_SITE_OTHER): Payer: Medicare HMO | Admitting: Obstetrics & Gynecology

## 2020-01-10 ENCOUNTER — Encounter: Payer: Self-pay | Admitting: Obstetrics & Gynecology

## 2020-01-10 ENCOUNTER — Other Ambulatory Visit: Payer: Self-pay

## 2020-01-10 ENCOUNTER — Other Ambulatory Visit (HOSPITAL_COMMUNITY)
Admission: RE | Admit: 2020-01-10 | Discharge: 2020-01-10 | Disposition: A | Discharge status: Home / Self Care | Payer: Medicare HMO | Source: Ambulatory Visit | Attending: Obstetrics & Gynecology | Admitting: Obstetrics & Gynecology

## 2020-01-10 ENCOUNTER — Telehealth: Payer: Self-pay | Admitting: Cardiology

## 2020-01-10 VITALS — BP 116/47 | HR 67 | Resp 16 | Ht 67.0 in | Wt 137.0 lb

## 2020-01-10 DIAGNOSIS — N186 End stage renal disease: Secondary | ICD-10-CM | POA: Diagnosis not present

## 2020-01-10 DIAGNOSIS — N710 Acute inflammatory disease of uterus: Secondary | ICD-10-CM | POA: Diagnosis not present

## 2020-01-10 DIAGNOSIS — N83202 Unspecified ovarian cyst, left side: Secondary | ICD-10-CM

## 2020-01-10 DIAGNOSIS — N83201 Unspecified ovarian cyst, right side: Secondary | ICD-10-CM | POA: Diagnosis not present

## 2020-01-10 DIAGNOSIS — N719 Inflammatory disease of uterus, unspecified: Secondary | ICD-10-CM

## 2020-01-10 DIAGNOSIS — I480 Paroxysmal atrial fibrillation: Secondary | ICD-10-CM | POA: Diagnosis not present

## 2020-01-10 DIAGNOSIS — R9389 Abnormal findings on diagnostic imaging of other specified body structures: Secondary | ICD-10-CM | POA: Insufficient documentation

## 2020-01-10 MED ORDER — DOXYCYCLINE HYCLATE 100 MG PO CAPS: 100.0000 mg | ORAL_CAPSULE | Freq: Two times a day (BID) | ORAL | 0 refills | Status: DC

## 2020-01-10 NOTE — Telephone Encounter (Signed)
Foward to  [facility]  pharmacist to refer and make recommendation

## 2020-01-10 NOTE — Patient Instructions (Signed)
Over the counter products for vaginal dryness/discharge:  Replens vaginal moisturizer, topical olive oil, coconut oil, or Vit E vaginal suppositories (which can be purchased off [employer]).  Use one of these products once or twice weekly.

## 2020-01-10 NOTE — Progress Notes (Signed)
GYNECOLOGY  VISIT  CC:   Follow up ovarian cyst  HPI: 74 y.o. G2P2 Married White or Caucasian female here for follow up of ovarian cyst.  Pt was seen in ER in September due to pelvic/abdominal pain.  This turned out to be GI related.  Pt does have issues with constipation that is related to her dialysis.  Had CT scan and ultrasound showing a 2cm and 4.3cm simple ovarian cyst on each ovary.  Endometrium was thickened at 40mm.  Pain has resolved.  She saw Dr. Nehemiah Settle 12/07/2019.  Ca-125 12/07/2019 was 22 on that date.    Ultrasound reviewed with pt and images reviewed personally.    Ultrasound report specifically states: There is a 4.3 cm simple cyst of the right ovary. Thinly septated cyst of the left ovary measuring 2.3 cm. These findings are almost certainly benign functional remnants. No further follow-up is required. This recommendation follows the consensus statement: Management of Asymptomatic Ovarian and Other Adnexal Cysts Imaged at [place]: Society of Radiologists in [place].  Thickened endometrium discussed with pt.  Need for endometrial sampling discussed.  Risks/benefits reviewed.  Will proceed today.  Pap smear was negative in July.  HR HPV was not obtained and not indicated.    She is in the process of being placed on the renal transplant list.  She is completing the process at this time.  This is through [place].  Is on dialysis three times weekly--M/W/F.  Pt has also noticed yellowish and sticky vaginal discharge.  Denies odor.  No STD concerns.  Wonders what this is and how to fix it.  GYNECOLOGIC HISTORY: No LMP recorded. Patient is postmenopausal. Contraception: PMP Menopausal hormone therapy: none  Patient Active Problem List   Diagnosis Date Noted  . UTI (urinary tract infection) 12/09/2019  . Nonrheumatic mitral valve regurgitation 06/12/2019  . Edema 04/09/2019  . Drug-induced constipation 10/18/2018  . External prolapsed hemorrhoids  09/25/2018  . Leg swelling 03/03/2018  . Long term (current) use of anticoagulants 11/01/2017  . Atrial fibrillation ([place]) 10/25/2017  . DDD (degenerative disc disease), lumbar 04/07/2017  . Hearing loss 01/04/2017  . Hyperlipidemia 01/04/2017  . Arthritis of fingers of both hands 10/01/2016  . Chronic rhinitis 10/01/2016  . Nonrheumatic aortic valve insufficiency 09/29/2016  . Bruit 09/29/2016  . Palpitation 09/22/2016  . Aortic valve disease 04/28/2016  . Anemia of chronic renal failure, stage 4 (severe) ([place]) 03/03/2016  . Sun-damaged skin 01/21/2016  . ESRD (end stage renal disease) ([place]) 11/09/2015  . Advanced care planning/counseling discussion 06/10/2014  . Pedal edema 03/28/2014  . Proteinuria 02/18/2014  . Hypercalcemia 02/18/2014  . Abnormal EKG 01/22/2014  . HTN (hypertension) 01/22/2014  . Dermatitis 06/30/2013  . Abnormal thyroid function test 06/25/2013  . Cervical cancer screening 06/10/2012  . Preventative health care 11/09/2011  . Multiple chemical sensitivity syndrome 06/25/2010  . History of chicken pox 06/25/2010  . Bladder polyps 06/25/2010  . History of cardiac dysrhythmia 06/25/2010  . Insomnia 06/24/2010  . Overweight 06/24/2010  . Fatigue 06/24/2010  . Chronic headaches 06/24/2010  . Allergy   . Anemia   . Depression     Past Medical History:  Diagnosis Date  . Advanced care planning/counseling discussion 06/10/2014   05/31/2014 patient presents copy of HCP and Living Will  . Anemia    iron deficiency  . Anxiety   . Atrial fibrillation ([place])   . Benign fundic gland polyps of stomach   . Bladder polyps 06/25/2010  .  Chronic headaches 06/24/2010  . Chronic renal insufficiency, stage 4 (severe) ([place]) 11/09/2015  . Depression 1991   hospitalized  . History of chicken pox 06/25/2010  . Hypercalcemia 02/18/2014  . Hypertension   . Insomnia 06/24/2010  . Multiple chemical sensitivity syndrome 06/25/2010  . Proteinuria 02/18/2014  . Renal insufficiency  03/26/2011  . Valvular heart disease 04/28/2016    Past Surgical History:  Procedure Laterality Date  . COLONOSCOPY  2014  . cyst on left breast removed Left 1991   benign  . ESOPHAGOGASTRODUODENOSCOPY (EGD) WITH ESOPHAGEAL DILATION  2014  . NASAL SEPTUM SURGERY  1986   rhinoplasty  . polyps on bladder removed  1972   benign  . TONSILLECTOMY  1962    MEDS:   Current Outpatient Medications on File Prior to Visit  Medication Sig Dispense Refill  . acetaminophen (TYLENOL) 500 MG tablet Take 500 mg by mouth every 6 (six) hours as needed for headache (pain).    . Alfalfa 250 MG TABS Take 1,500 mg by mouth 3 (three) times daily.    Marland Kitchen atorvastatin (LIPITOR) 20 MG tablet Take 20 mg by mouth daily.    Marland Kitchen b complex vitamins capsule Take 1 capsule by mouth 2 (two) times daily with a meal.     . Cholecalciferol (VITAMIN D PO) Take by mouth daily.    . cinacalcet (SENSIPAR) 30 MG tablet Take by mouth.    . diltiazem (CARDIZEM CD) 240 MG 24 hr capsule Take 240 mg by mouth daily.    . Flaxseed, Linseed, (FLAX SEED OIL PO) Take 1 capsule 2 (two) times daily by mouth.     . IRON PO Take 15 mg by mouth daily. Shaklee product    . L-GLUTAMINE PO Take 1 capsule by mouth daily after supper.     . multivitamin (RENA-VIT) TABS tablet Take 1 tablet by mouth daily.    . pantoprazole (PROTONIX) 40 MG tablet Take 1 tablet (40 mg total) by mouth daily. 30 tablet 3  . Probiotic Product (PROBIOTIC DAILY PO) Take 1 tablet by mouth daily with lunch.     . sevelamer carbonate (RENVELA) 800 MG tablet Take 1,600 mg by mouth 3 (three) times daily.    . vitamin C (ASCORBIC ACID) 500 MG tablet Take 500 mg by mouth daily as needed (immune system boost).     Marland Kitchen VITAMIN E PO Take 1 capsule by mouth daily with lunch.    . warfarin (COUMADIN) 5 MG tablet TAKE 1 & 1/2 (ONE & ONE-HALF) TABLETS BY MOUTH ONCE DAILY AS DIRECTED 45 tablet 0  . carvedilol (COREG) 25 MG tablet Take 1 tablet (25 mg total) by mouth 2 (two) times daily.  180 tablet 3  . hydrALAZINE (APRESOLINE) 100 MG tablet Take 1 tablet by mouth 3 (three) times daily.      No current facility-administered medications on file prior to visit.    ALLERGIES: Sulfa antibiotics and Clonidine derivatives  Family History  Problem Relation Age of Onset  . Breast cancer Mother 47       left breast removed, 2010 lung  . Diabetes Mother   . Nephrolithiasis Father   . Heart attack Father 4       X 3  . Heart disease Father        smoker  . Hypertension Brother   . Allergic rhinitis Brother   . Melanoma Son 37       melanoma on leg removed  . Pancreatic cancer Maternal Grandmother   .  Hypertension Paternal Grandmother   . Obesity Paternal Grandmother   . Heart attack Paternal Grandfather 60  . Diabetes Maternal Aunt        x 2  . Diabetes Maternal Uncle        x 3  . Asthma Neg Hx   . Eczema Neg Hx   . Urticaria Neg Hx   . Immunodeficiency Neg Hx   . Angioedema Neg Hx     SH:  Married, non smoker  Review of Systems  Constitutional: Negative.   Cardiovascular: Negative for chest pain.  Gastrointestinal: Negative.   Genitourinary: Negative.   Musculoskeletal: Negative.   Psychiatric/Behavioral: Negative.     PHYSICAL EXAMINATION:    BP (!) 116/47   Pulse 67   Resp 16   Ht 5\' 7"  (1.702 m)   Wt 137 lb (62.1 kg)   BMI 21.46 kg/m     General appearance: alert, cooperative and appears stated age Abdomen: soft, non-tender; bowel sounds normal; no masses,  no organomegaly Lymph:  no inguinal LAD noted  Pelvic: External genitalia:  no lesions              Urethra:  normal appearing urethra with no masses, tenderness or lesions              Bartholins and Skenes: normal                 Vagina: normal appearing vagina with normal color and discharge, no lesions              Cervix: no lesions              Bimanual Exam:  Uterus:  normal size, contour, position, consistency, mobility, non-tender              Adnexa: no mass, fullness,  tenderness  Endometrial biopsy recommended.  Discussed with patient.  Verbal and written consent obtained.   Procedure:  Speculum placed.  Cervix visualized and cleansed with betadine prep.  A single toothed tenaculum was applied to the anterior lip of the cervix.  Endometrial pipelle was advanced through the cervix into the endometrial cavity without difficulty.  Dilation of the cervix was required with milex dilator.  Pipelle passed to 6cm.  Suction applied and pipelle removed with purulent material noted.  Two additional passes performed until blood and endometrial tissue obtained.   good tissue sample obtained.  Tenculum removed.  Minimal bleeding noted.  Patient tolerated procedure well.  Chaperone, Sherryle Lis, RN, was present for exam.  Assessment/Plan: 1. Thickened endometrium - Surgical pathology( [place]/ [facility]) obtained today.  Has appearance of pyometra.  Three passes performed until blood and tissue present  2. Pyometra - Doxycycline 100mg  bid x 7 days initiated.  Reveiwed Up to Date for renal dosing adjusment.  None recommended.  Also confirmed with Ivin Booty, RN, [facility] that no adjustment with following coumadin is needed.  3. Cysts of both ovaries - US PELVIS (TRANSABDOMINAL ONLY); Future.  Will be repeated.  Radiology recommendation was no follow-up but given age and size, feel should monitor to show stability.  Repeat now and then consider again in 6 months then 18 months to show stability.  Pt comfortable with this plan.  4. ESRD (end stage renal disease) ([place]) - followed at [facility].  On three times weekly dialysis and working towards begin on renal transplant list.  5. Paroxysmal atrial fibrillation ([place]) -Followed by Dr. Percival Spanish and on Coumadin.    6. Vaginal atrophic changes. -  OTC options for treatment discussed.  Do not feel any estrogen products are appropriate in this pt.

## 2020-01-10 NOTE — Telephone Encounter (Signed)
No interaction expected between doxycycline and her current cardiac medication.

## 2020-01-10 NOTE — Telephone Encounter (Signed)
Mickel Baas with the [facility] is requesting to speak with Dr. Rosezella [place] nurse. She states Dr. Sabra Heck would like to put the patient on Doxycycline 2x daily for 1 week. She would like to ensure that this will not interfere with any cardiac medications. Please return call to advise.  Phone#: [phone]

## 2020-01-10 NOTE — Telephone Encounter (Signed)
Spoke to Sherman at [facility] not able to come to the phone.  information given - no interaction with current cardiac medication .  Information will be given to Mickel Baas.

## 2020-01-11 DIAGNOSIS — Z1231 Encounter for screening mammogram for malignant neoplasm of breast: Secondary | ICD-10-CM | POA: Diagnosis not present

## 2020-01-11 LAB — HM MAMMOGRAPHY

## 2020-01-11 LAB — SURGICAL PATHOLOGY

## 2020-01-13 DIAGNOSIS — I361 Nonrheumatic tricuspid (valve) insufficiency: Secondary | ICD-10-CM | POA: Diagnosis not present

## 2020-01-13 DIAGNOSIS — I351 Nonrheumatic aortic (valve) insufficiency: Secondary | ICD-10-CM | POA: Diagnosis not present

## 2020-01-15 ENCOUNTER — Telehealth: Payer: Self-pay

## 2020-01-15 DIAGNOSIS — Z7901 Long term (current) use of anticoagulants: Secondary | ICD-10-CM | POA: Diagnosis not present

## 2020-01-15 NOTE — Telephone Encounter (Signed)
Left message for patient to return call to office. Aften Lipsey  RN 

## 2020-01-15 NOTE — Telephone Encounter (Signed)
-----   Message from Megan Salon, MD sent at 01/11/2020  1:24 PM EST ----- Please let pt know her endometrial biopsy did not show any abnormal cells.  Inflammatory cells were noted and this is consistent with the pyometra seen yesterday.  She is scheduled for repeat ultrasound 01/18/2020.  If endometrium is thin at that time, will likely not need anything else.

## 2020-01-16 DIAGNOSIS — I120 Hypertensive chronic kidney disease with stage 5 chronic kidney disease or end stage renal disease: Secondary | ICD-10-CM | POA: Diagnosis not present

## 2020-01-16 DIAGNOSIS — I480 Paroxysmal atrial fibrillation: Secondary | ICD-10-CM | POA: Diagnosis not present

## 2020-01-16 DIAGNOSIS — Z992 Dependence on renal dialysis: Secondary | ICD-10-CM | POA: Diagnosis not present

## 2020-01-16 DIAGNOSIS — Z01818 Encounter for other preprocedural examination: Secondary | ICD-10-CM | POA: Diagnosis not present

## 2020-01-16 DIAGNOSIS — N186 End stage renal disease: Secondary | ICD-10-CM | POA: Diagnosis not present

## 2020-01-18 ENCOUNTER — Ambulatory Visit (HOSPITAL_BASED_OUTPATIENT_CLINIC_OR_DEPARTMENT_OTHER)
Admission: RE | Admit: 2020-01-18 | Discharge: 2020-01-18 | Disposition: A | Discharge status: Home / Self Care | Payer: Medicare HMO | Source: Ambulatory Visit | Attending: Obstetrics & Gynecology | Admitting: Obstetrics & Gynecology

## 2020-01-18 ENCOUNTER — Other Ambulatory Visit: Payer: Self-pay | Admitting: Obstetrics & Gynecology

## 2020-01-18 ENCOUNTER — Other Ambulatory Visit: Payer: Self-pay

## 2020-01-18 DIAGNOSIS — N83201 Unspecified ovarian cyst, right side: Secondary | ICD-10-CM

## 2020-01-18 DIAGNOSIS — N83202 Unspecified ovarian cyst, left side: Secondary | ICD-10-CM | POA: Insufficient documentation

## 2020-01-18 DIAGNOSIS — N719 Inflammatory disease of uterus, unspecified: Secondary | ICD-10-CM | POA: Insufficient documentation

## 2020-01-18 DIAGNOSIS — Z780 Asymptomatic menopausal state: Secondary | ICD-10-CM | POA: Diagnosis not present

## 2020-01-19 ENCOUNTER — Telehealth: Payer: Self-pay

## 2020-01-19 DIAGNOSIS — N83202 Unspecified ovarian cyst, left side: Secondary | ICD-10-CM

## 2020-01-19 DIAGNOSIS — N83201 Unspecified ovarian cyst, right side: Secondary | ICD-10-CM

## 2020-01-19 NOTE — Telephone Encounter (Signed)
Patient states she did well after the endometrial biopsy and is not bleeding now. Patient made aware of the results from ultrasound and need for follow up ultrasound in three months. Patient states understanding.  Orders placed. Kathrene Alu RN

## 2020-01-19 NOTE — Telephone Encounter (Signed)
Left message for patient to call the office back. Kathrene Alu RN

## 2020-01-22 DIAGNOSIS — Z7901 Long term (current) use of anticoagulants: Secondary | ICD-10-CM | POA: Diagnosis not present

## 2020-01-25 DIAGNOSIS — R310 Gross hematuria: Secondary | ICD-10-CM | POA: Diagnosis not present

## 2020-01-29 DIAGNOSIS — Z7901 Long term (current) use of anticoagulants: Secondary | ICD-10-CM | POA: Diagnosis not present

## 2020-02-02 DIAGNOSIS — N186 End stage renal disease: Secondary | ICD-10-CM | POA: Diagnosis not present

## 2020-02-02 DIAGNOSIS — Z992 Dependence on renal dialysis: Secondary | ICD-10-CM | POA: Diagnosis not present

## 2020-02-05 DIAGNOSIS — Z7901 Long term (current) use of anticoagulants: Secondary | ICD-10-CM | POA: Diagnosis not present

## 2020-02-05 DIAGNOSIS — N2581 Secondary hyperparathyroidism of renal origin: Secondary | ICD-10-CM | POA: Diagnosis not present

## 2020-02-05 DIAGNOSIS — I10 Essential (primary) hypertension: Secondary | ICD-10-CM | POA: Diagnosis not present

## 2020-02-05 DIAGNOSIS — N186 End stage renal disease: Secondary | ICD-10-CM | POA: Diagnosis not present

## 2020-02-05 DIAGNOSIS — D509 Iron deficiency anemia, unspecified: Secondary | ICD-10-CM | POA: Diagnosis not present

## 2020-02-05 DIAGNOSIS — D631 Anemia in chronic kidney disease: Secondary | ICD-10-CM | POA: Diagnosis not present

## 2020-02-07 DIAGNOSIS — D631 Anemia in chronic kidney disease: Secondary | ICD-10-CM | POA: Diagnosis not present

## 2020-02-07 DIAGNOSIS — N2581 Secondary hyperparathyroidism of renal origin: Secondary | ICD-10-CM | POA: Diagnosis not present

## 2020-02-07 DIAGNOSIS — D509 Iron deficiency anemia, unspecified: Secondary | ICD-10-CM | POA: Diagnosis not present

## 2020-02-07 DIAGNOSIS — N186 End stage renal disease: Secondary | ICD-10-CM | POA: Diagnosis not present

## 2020-02-07 DIAGNOSIS — I10 Essential (primary) hypertension: Secondary | ICD-10-CM | POA: Diagnosis not present

## 2020-02-08 DIAGNOSIS — M21372 Foot drop, left foot: Secondary | ICD-10-CM | POA: Diagnosis not present

## 2020-02-09 DIAGNOSIS — D509 Iron deficiency anemia, unspecified: Secondary | ICD-10-CM | POA: Diagnosis not present

## 2020-02-09 DIAGNOSIS — I10 Essential (primary) hypertension: Secondary | ICD-10-CM | POA: Diagnosis not present

## 2020-02-09 DIAGNOSIS — D631 Anemia in chronic kidney disease: Secondary | ICD-10-CM | POA: Diagnosis not present

## 2020-02-09 DIAGNOSIS — N2581 Secondary hyperparathyroidism of renal origin: Secondary | ICD-10-CM | POA: Diagnosis not present

## 2020-02-09 DIAGNOSIS — N186 End stage renal disease: Secondary | ICD-10-CM | POA: Diagnosis not present

## 2020-02-12 DIAGNOSIS — D631 Anemia in chronic kidney disease: Secondary | ICD-10-CM | POA: Diagnosis not present

## 2020-02-12 DIAGNOSIS — N2581 Secondary hyperparathyroidism of renal origin: Secondary | ICD-10-CM | POA: Diagnosis not present

## 2020-02-12 DIAGNOSIS — N186 End stage renal disease: Secondary | ICD-10-CM | POA: Diagnosis not present

## 2020-02-12 DIAGNOSIS — Z7901 Long term (current) use of anticoagulants: Secondary | ICD-10-CM | POA: Diagnosis not present

## 2020-02-12 DIAGNOSIS — I10 Essential (primary) hypertension: Secondary | ICD-10-CM | POA: Diagnosis not present

## 2020-02-12 DIAGNOSIS — D509 Iron deficiency anemia, unspecified: Secondary | ICD-10-CM | POA: Diagnosis not present

## 2020-02-14 DIAGNOSIS — D509 Iron deficiency anemia, unspecified: Secondary | ICD-10-CM | POA: Diagnosis not present

## 2020-02-14 DIAGNOSIS — N2581 Secondary hyperparathyroidism of renal origin: Secondary | ICD-10-CM | POA: Diagnosis not present

## 2020-02-14 DIAGNOSIS — I10 Essential (primary) hypertension: Secondary | ICD-10-CM | POA: Diagnosis not present

## 2020-02-14 DIAGNOSIS — D631 Anemia in chronic kidney disease: Secondary | ICD-10-CM | POA: Diagnosis not present

## 2020-02-14 DIAGNOSIS — N186 End stage renal disease: Secondary | ICD-10-CM | POA: Diagnosis not present

## 2020-02-15 DIAGNOSIS — N281 Cyst of kidney, acquired: Secondary | ICD-10-CM | POA: Diagnosis not present

## 2020-02-15 DIAGNOSIS — K8020 Calculus of gallbladder without cholecystitis without obstruction: Secondary | ICD-10-CM | POA: Diagnosis not present

## 2020-02-15 DIAGNOSIS — K8080 Other cholelithiasis without obstruction: Secondary | ICD-10-CM | POA: Diagnosis not present

## 2020-02-15 DIAGNOSIS — R310 Gross hematuria: Secondary | ICD-10-CM | POA: Diagnosis not present

## 2020-02-16 DIAGNOSIS — D631 Anemia in chronic kidney disease: Secondary | ICD-10-CM | POA: Diagnosis not present

## 2020-02-16 DIAGNOSIS — D509 Iron deficiency anemia, unspecified: Secondary | ICD-10-CM | POA: Diagnosis not present

## 2020-02-16 DIAGNOSIS — I10 Essential (primary) hypertension: Secondary | ICD-10-CM | POA: Diagnosis not present

## 2020-02-16 DIAGNOSIS — N186 End stage renal disease: Secondary | ICD-10-CM | POA: Diagnosis not present

## 2020-02-16 DIAGNOSIS — N2581 Secondary hyperparathyroidism of renal origin: Secondary | ICD-10-CM | POA: Diagnosis not present

## 2020-02-20 DIAGNOSIS — I10 Essential (primary) hypertension: Secondary | ICD-10-CM | POA: Diagnosis not present

## 2020-02-20 DIAGNOSIS — N186 End stage renal disease: Secondary | ICD-10-CM | POA: Diagnosis not present

## 2020-02-20 DIAGNOSIS — Z7901 Long term (current) use of anticoagulants: Secondary | ICD-10-CM | POA: Diagnosis not present

## 2020-02-20 DIAGNOSIS — N2581 Secondary hyperparathyroidism of renal origin: Secondary | ICD-10-CM | POA: Diagnosis not present

## 2020-02-20 DIAGNOSIS — D631 Anemia in chronic kidney disease: Secondary | ICD-10-CM | POA: Diagnosis not present

## 2020-02-20 DIAGNOSIS — D509 Iron deficiency anemia, unspecified: Secondary | ICD-10-CM | POA: Diagnosis not present

## 2020-02-21 DIAGNOSIS — D509 Iron deficiency anemia, unspecified: Secondary | ICD-10-CM | POA: Diagnosis not present

## 2020-02-21 DIAGNOSIS — D631 Anemia in chronic kidney disease: Secondary | ICD-10-CM | POA: Diagnosis not present

## 2020-02-21 DIAGNOSIS — N186 End stage renal disease: Secondary | ICD-10-CM | POA: Diagnosis not present

## 2020-02-21 DIAGNOSIS — I10 Essential (primary) hypertension: Secondary | ICD-10-CM | POA: Diagnosis not present

## 2020-02-21 DIAGNOSIS — N2581 Secondary hyperparathyroidism of renal origin: Secondary | ICD-10-CM | POA: Diagnosis not present

## 2020-02-22 DIAGNOSIS — R310 Gross hematuria: Secondary | ICD-10-CM | POA: Diagnosis not present

## 2020-02-23 DIAGNOSIS — I10 Essential (primary) hypertension: Secondary | ICD-10-CM | POA: Diagnosis not present

## 2020-02-23 DIAGNOSIS — D631 Anemia in chronic kidney disease: Secondary | ICD-10-CM | POA: Diagnosis not present

## 2020-02-23 DIAGNOSIS — N186 End stage renal disease: Secondary | ICD-10-CM | POA: Diagnosis not present

## 2020-02-23 DIAGNOSIS — N2581 Secondary hyperparathyroidism of renal origin: Secondary | ICD-10-CM | POA: Diagnosis not present

## 2020-02-23 DIAGNOSIS — D509 Iron deficiency anemia, unspecified: Secondary | ICD-10-CM | POA: Diagnosis not present

## 2020-02-26 DIAGNOSIS — N2581 Secondary hyperparathyroidism of renal origin: Secondary | ICD-10-CM | POA: Diagnosis not present

## 2020-02-26 DIAGNOSIS — N186 End stage renal disease: Secondary | ICD-10-CM | POA: Diagnosis not present

## 2020-02-26 DIAGNOSIS — Z7901 Long term (current) use of anticoagulants: Secondary | ICD-10-CM | POA: Diagnosis not present

## 2020-02-26 DIAGNOSIS — D631 Anemia in chronic kidney disease: Secondary | ICD-10-CM | POA: Diagnosis not present

## 2020-02-26 DIAGNOSIS — I10 Essential (primary) hypertension: Secondary | ICD-10-CM | POA: Diagnosis not present

## 2020-02-26 DIAGNOSIS — D509 Iron deficiency anemia, unspecified: Secondary | ICD-10-CM | POA: Diagnosis not present

## 2020-02-28 DIAGNOSIS — D509 Iron deficiency anemia, unspecified: Secondary | ICD-10-CM | POA: Diagnosis not present

## 2020-02-28 DIAGNOSIS — N186 End stage renal disease: Secondary | ICD-10-CM | POA: Diagnosis not present

## 2020-02-28 DIAGNOSIS — I10 Essential (primary) hypertension: Secondary | ICD-10-CM | POA: Diagnosis not present

## 2020-02-28 DIAGNOSIS — D631 Anemia in chronic kidney disease: Secondary | ICD-10-CM | POA: Diagnosis not present

## 2020-02-28 DIAGNOSIS — R69 Illness, unspecified: Secondary | ICD-10-CM | POA: Diagnosis not present

## 2020-02-28 DIAGNOSIS — Z1159 Encounter for screening for other viral diseases: Secondary | ICD-10-CM | POA: Diagnosis not present

## 2020-02-28 DIAGNOSIS — N2581 Secondary hyperparathyroidism of renal origin: Secondary | ICD-10-CM | POA: Diagnosis not present

## 2020-03-01 DIAGNOSIS — N2581 Secondary hyperparathyroidism of renal origin: Secondary | ICD-10-CM | POA: Diagnosis not present

## 2020-03-01 DIAGNOSIS — I10 Essential (primary) hypertension: Secondary | ICD-10-CM | POA: Diagnosis not present

## 2020-03-01 DIAGNOSIS — D631 Anemia in chronic kidney disease: Secondary | ICD-10-CM | POA: Diagnosis not present

## 2020-03-01 DIAGNOSIS — N186 End stage renal disease: Secondary | ICD-10-CM | POA: Diagnosis not present

## 2020-03-01 DIAGNOSIS — D509 Iron deficiency anemia, unspecified: Secondary | ICD-10-CM | POA: Diagnosis not present

## 2020-03-04 DIAGNOSIS — Z992 Dependence on renal dialysis: Secondary | ICD-10-CM | POA: Diagnosis not present

## 2020-03-04 DIAGNOSIS — N186 End stage renal disease: Secondary | ICD-10-CM | POA: Diagnosis not present

## 2020-03-06 DIAGNOSIS — N186 End stage renal disease: Secondary | ICD-10-CM | POA: Diagnosis not present

## 2020-03-06 DIAGNOSIS — N2581 Secondary hyperparathyroidism of renal origin: Secondary | ICD-10-CM | POA: Diagnosis not present

## 2020-03-06 DIAGNOSIS — D631 Anemia in chronic kidney disease: Secondary | ICD-10-CM | POA: Diagnosis not present

## 2020-03-08 DIAGNOSIS — N186 End stage renal disease: Secondary | ICD-10-CM | POA: Diagnosis not present

## 2020-03-08 DIAGNOSIS — N2581 Secondary hyperparathyroidism of renal origin: Secondary | ICD-10-CM | POA: Diagnosis not present

## 2020-03-08 DIAGNOSIS — D631 Anemia in chronic kidney disease: Secondary | ICD-10-CM | POA: Diagnosis not present

## 2020-03-08 DIAGNOSIS — Z7901 Long term (current) use of anticoagulants: Secondary | ICD-10-CM | POA: Diagnosis not present

## 2020-03-11 DIAGNOSIS — D631 Anemia in chronic kidney disease: Secondary | ICD-10-CM | POA: Diagnosis not present

## 2020-03-11 DIAGNOSIS — Z7901 Long term (current) use of anticoagulants: Secondary | ICD-10-CM | POA: Diagnosis not present

## 2020-03-11 DIAGNOSIS — N186 End stage renal disease: Secondary | ICD-10-CM | POA: Diagnosis not present

## 2020-03-11 DIAGNOSIS — N2581 Secondary hyperparathyroidism of renal origin: Secondary | ICD-10-CM | POA: Diagnosis not present

## 2020-03-13 DIAGNOSIS — Z7901 Long term (current) use of anticoagulants: Secondary | ICD-10-CM | POA: Diagnosis not present

## 2020-03-13 DIAGNOSIS — D631 Anemia in chronic kidney disease: Secondary | ICD-10-CM | POA: Diagnosis not present

## 2020-03-13 DIAGNOSIS — N2581 Secondary hyperparathyroidism of renal origin: Secondary | ICD-10-CM | POA: Diagnosis not present

## 2020-03-13 DIAGNOSIS — N186 End stage renal disease: Secondary | ICD-10-CM | POA: Diagnosis not present

## 2020-03-15 DIAGNOSIS — D631 Anemia in chronic kidney disease: Secondary | ICD-10-CM | POA: Diagnosis not present

## 2020-03-15 DIAGNOSIS — N2581 Secondary hyperparathyroidism of renal origin: Secondary | ICD-10-CM | POA: Diagnosis not present

## 2020-03-15 DIAGNOSIS — N186 End stage renal disease: Secondary | ICD-10-CM | POA: Diagnosis not present

## 2020-03-18 DIAGNOSIS — Z7901 Long term (current) use of anticoagulants: Secondary | ICD-10-CM | POA: Diagnosis not present

## 2020-03-18 DIAGNOSIS — D631 Anemia in chronic kidney disease: Secondary | ICD-10-CM | POA: Diagnosis not present

## 2020-03-18 DIAGNOSIS — N2581 Secondary hyperparathyroidism of renal origin: Secondary | ICD-10-CM | POA: Diagnosis not present

## 2020-03-18 DIAGNOSIS — N186 End stage renal disease: Secondary | ICD-10-CM | POA: Diagnosis not present

## 2020-03-19 DIAGNOSIS — M8588 Other specified disorders of bone density and structure, other site: Secondary | ICD-10-CM | POA: Diagnosis not present

## 2020-03-19 DIAGNOSIS — M810 Age-related osteoporosis without current pathological fracture: Secondary | ICD-10-CM | POA: Diagnosis not present

## 2020-03-19 LAB — HM DEXA SCAN

## 2020-03-20 DIAGNOSIS — N2581 Secondary hyperparathyroidism of renal origin: Secondary | ICD-10-CM | POA: Diagnosis not present

## 2020-03-20 DIAGNOSIS — N186 End stage renal disease: Secondary | ICD-10-CM | POA: Diagnosis not present

## 2020-03-20 DIAGNOSIS — D631 Anemia in chronic kidney disease: Secondary | ICD-10-CM | POA: Diagnosis not present

## 2020-03-21 ENCOUNTER — Telehealth: Payer: Self-pay

## 2020-03-21 NOTE — Telephone Encounter (Signed)
ok 

## 2020-03-21 NOTE — Telephone Encounter (Signed)
Pt is aware of dexa scan results and would like to discuss medication when she comes back in for next appointment.

## 2020-03-21 NOTE — Telephone Encounter (Addendum)
LVM to call back to discuss bone density results  Notify that bones are notably thinning. Ask if she willing to try medication?

## 2020-03-22 DIAGNOSIS — N2581 Secondary hyperparathyroidism of renal origin: Secondary | ICD-10-CM | POA: Diagnosis not present

## 2020-03-22 DIAGNOSIS — N186 End stage renal disease: Secondary | ICD-10-CM | POA: Diagnosis not present

## 2020-03-22 DIAGNOSIS — D631 Anemia in chronic kidney disease: Secondary | ICD-10-CM | POA: Diagnosis not present

## 2020-03-25 DIAGNOSIS — D631 Anemia in chronic kidney disease: Secondary | ICD-10-CM | POA: Diagnosis not present

## 2020-03-25 DIAGNOSIS — N186 End stage renal disease: Secondary | ICD-10-CM | POA: Diagnosis not present

## 2020-03-25 DIAGNOSIS — N2581 Secondary hyperparathyroidism of renal origin: Secondary | ICD-10-CM | POA: Diagnosis not present

## 2020-03-25 DIAGNOSIS — Z7901 Long term (current) use of anticoagulants: Secondary | ICD-10-CM | POA: Diagnosis not present

## 2020-03-26 DIAGNOSIS — H5212 Myopia, left eye: Secondary | ICD-10-CM | POA: Diagnosis not present

## 2020-03-27 DIAGNOSIS — D631 Anemia in chronic kidney disease: Secondary | ICD-10-CM | POA: Diagnosis not present

## 2020-03-27 DIAGNOSIS — N2581 Secondary hyperparathyroidism of renal origin: Secondary | ICD-10-CM | POA: Diagnosis not present

## 2020-03-27 DIAGNOSIS — N186 End stage renal disease: Secondary | ICD-10-CM | POA: Diagnosis not present

## 2020-03-29 DIAGNOSIS — D631 Anemia in chronic kidney disease: Secondary | ICD-10-CM | POA: Diagnosis not present

## 2020-03-29 DIAGNOSIS — N2581 Secondary hyperparathyroidism of renal origin: Secondary | ICD-10-CM | POA: Diagnosis not present

## 2020-03-29 DIAGNOSIS — N186 End stage renal disease: Secondary | ICD-10-CM | POA: Diagnosis not present

## 2020-03-31 NOTE — Progress Notes (Signed)
Cardiology Office Note   Date:  04/02/2020   ID:  JONETTA DAGLEY, DOB [birth year], MRN [id number]  PCP:  [name], MD  Cardiologist:   Minus Breeding, MD  Referring:  [name], MD   Chief Complaint  Patient presents with  . Atrial Fibrillation      History of Present Illness: Jillian Hayes is a 75 y.o. female who presents for follow up of HTN.   She had an abnormal EKG and borderline POET (Plain Old Exercise Treadmill).  I sent her for a perfusion study which was negative for ischemia.  She was admitted 9/23-9/25/2019 for atrial fibrillation, RVR.  She spontaneously converted to sinus rhythm and was started on Cardizem and Coumadin.  Echo showed mod MR and AI.    She is on the transplant list for a kidney.  She had a work-up at [facility] and I did review these records.  In October she had a perfusion study which was negative for ischemia.  She did have in December that demonstrated well-preserved ejection fraction.  There was mild aortic regurgitation.  There was only trace mitral regurgitation.  There was no aortic stenosis noted.  Past Medical History:  Diagnosis Date  . Advanced care planning/counseling discussion 06/10/2014   05/31/2014 patient presents copy of HCP and Living Will  . Anemia    iron deficiency  . Anxiety   . Atrial fibrillation ([place])   . Benign fundic gland polyps of stomach   . Bladder polyps 06/25/2010  . Chronic headaches 06/24/2010  . Depression 1991   hospitalized  . ESRD (end stage renal disease) ([place]) 11/09/2015  . History of chicken pox 06/25/2010  . Hypercalcemia 02/18/2014  . Hypertension   . Insomnia 06/24/2010  . Multiple chemical sensitivity syndrome 06/25/2010  . Proteinuria 02/18/2014  . Renal insufficiency 03/26/2011  . Valvular heart disease 04/28/2016    Past Surgical History:  Procedure Laterality Date  . COLONOSCOPY  2014  . cyst on left breast removed Left 1991   benign  . ESOPHAGOGASTRODUODENOSCOPY (EGD) WITH  ESOPHAGEAL DILATION  2014  . NASAL SEPTUM SURGERY  1986   rhinoplasty  . polyps on bladder removed  1972   benign  . TONSILLECTOMY  1962     Current Outpatient Medications  Medication Sig Dispense Refill  . acetaminophen (TYLENOL) 500 MG tablet Take 500 mg by mouth every 6 (six) hours as needed for headache (pain).    . Alfalfa 250 MG TABS Take 1,500 mg by mouth 3 (three) times daily.    Marland Kitchen atorvastatin (LIPITOR) 20 MG tablet Take 20 mg by mouth daily.    Marland Kitchen b complex vitamins capsule Take 1 capsule by mouth 2 (two) times daily with a meal.     . carvedilol (COREG) 25 MG tablet Take 1 tablet (25 mg total) by mouth 2 (two) times daily. 180 tablet 3  . Cholecalciferol (VITAMIN D PO) Take by mouth daily.    . cinacalcet (SENSIPAR) 30 MG tablet Take by mouth.    . diltiazem (CARDIZEM CD) 240 MG 24 hr capsule Take 240 mg by mouth daily.    Marland Kitchen doxycycline (VIBRAMYCIN) 100 MG capsule Take 1 capsule (100 mg total) by mouth 2 (two) times daily. Take BID for 14 days.  Take with food as can cause GI distress. 14 capsule 0  . Flaxseed, Linseed, (FLAX SEED OIL PO) Take 1 capsule 2 (two) times daily by mouth.     . hydrALAZINE (APRESOLINE) 50  MG tablet Take 50 mg by mouth 2 (two) times daily.    . IRON PO Take 15 mg by mouth daily. Shaklee product    . L-GLUTAMINE PO Take 1 capsule by mouth daily after supper.     . multivitamin (RENA-VIT) TABS tablet Take 1 tablet by mouth daily.    . Probiotic Product (PROBIOTIC DAILY PO) Take 1 tablet by mouth daily with lunch.     . sevelamer carbonate (RENVELA) 800 MG tablet Take 1,600 mg by mouth 3 (three) times daily.    . vitamin C (ASCORBIC ACID) 500 MG tablet Take 500 mg by mouth daily as needed (immune system boost).    Marland Kitchen VITAMIN E PO Take 1 capsule by mouth daily with lunch.    . warfarin (COUMADIN) 5 MG tablet TAKE 1 & 1/2 (ONE & ONE-HALF) TABLETS BY MOUTH ONCE DAILY AS DIRECTED 45 tablet 0   No current facility-administered medications for this visit.     Allergies:   Sulfa antibiotics and Clonidine derivatives    ROS:  Please see the history of present illness.   Otherwise, review of systems are positive for none.   All other systems are reviewed and negative.    PHYSICAL EXAM: VS:  BP (!) 148/50   Pulse 64   Ht 5\' 6"  (1.676 m)   Wt 133 lb (60.3 kg)   SpO2 95%   BMI 21.47 kg/m  , BMI Body mass index is 21.47 kg/m. GENERAL:  Well appearing NECK:  No jugular venous distention, waveform within normal limits, carotid upstroke brisk and symmetric, no bruits, no thyromegaly LUNGS:  Clear to auscultation bilaterally CHEST:  Unremarkable HEART:  PMI not displaced or sustained,S1 and S2 within normal limits, no S3, no S4, no clicks, no rubs, 2 out of 6 apical systolic murmur nonradiating, 2 out of 6 diastolic murmur ending late in diastole ABD:  Flat, positive bowel sounds normal in frequency in pitch, no bruits, no rebound, no guarding, no midline pulsatile mass, no hepatomegaly, no splenomegaly EXT:  2 plus pulses throughout, no edema, no cyanosis no clubbing   EKG:  EKG is  ordered today. Sinus rhythm, rate 64, premature atrial contractions, no acute ST-T wave changes.  Premature atrial contractions  Recent Labs: 12/06/2019: ALT 15; BUN 59; Creatinine 5.7; Hemoglobin 12.0; Platelets 193; Potassium 4.4; Sodium 139    Lipid Panel    Component Value Date/Time   CHOL 127 12/06/2019 0000   TRIG 126 12/06/2019 0000   HDL 84 (A) 11/08/2019 0000   CHOLHDL 2 09/20/2018 1139   VLDL 16.6 09/20/2018 1139   LDLCALC 30 11/08/2019 0000      Wt Readings from Last 3 Encounters:  04/02/20 133 lb (60.3 kg)  01/10/20 137 lb (62.1 kg)  12/07/19 135 lb (61.2 kg)      Other studies Reviewed: Additional studies/ records that were reviewed today include:  Care Everywhere Review of the above records demonstrates: See elsewhere   ASSESSMENT AND PLAN:  ATRIAL FIB:   Ms. [name] has a CHA2DS2 - VASc score of 3.  She thinks she is  having some paroxysms of this.  Tolerates anticoagulation.  I have instructed her to get a [employer].  For now no change in therapy.  HTN:  The blood pressure is mildly elevated today but it has been quite labile so I am not going to change her meds at this point.   MILD/MODERATE AI:   This was mild to moderate on echo  in March 2021.  However, it was said to be trace mitral regurgitation and moderate AI in December.  I will repeat this in 18 months.    Current medicines are reviewed at length with the patient today.  The patient does not have concerns regarding medicines.  The following changes have been made:  None  Labs/ tests ordered today include:  None  Orders Placed This Encounter  Procedures  . EKG 12-Lead     Disposition:   FU with me in 12   Signed, Minus Breeding, MD  04/02/2020 3:52 PM    [facility]

## 2020-04-01 DIAGNOSIS — Z992 Dependence on renal dialysis: Secondary | ICD-10-CM | POA: Diagnosis not present

## 2020-04-01 DIAGNOSIS — N2581 Secondary hyperparathyroidism of renal origin: Secondary | ICD-10-CM | POA: Diagnosis not present

## 2020-04-01 DIAGNOSIS — Z7901 Long term (current) use of anticoagulants: Secondary | ICD-10-CM | POA: Diagnosis not present

## 2020-04-01 DIAGNOSIS — D631 Anemia in chronic kidney disease: Secondary | ICD-10-CM | POA: Diagnosis not present

## 2020-04-01 DIAGNOSIS — N186 End stage renal disease: Secondary | ICD-10-CM | POA: Diagnosis not present

## 2020-04-02 ENCOUNTER — Other Ambulatory Visit: Payer: Self-pay

## 2020-04-02 ENCOUNTER — Ambulatory Visit: Payer: Medicare HMO | Admitting: Cardiology

## 2020-04-02 ENCOUNTER — Encounter: Payer: Self-pay | Admitting: Cardiology

## 2020-04-02 VITALS — BP 148/50 | HR 64 | Ht 66.0 in | Wt 133.0 lb

## 2020-04-02 DIAGNOSIS — I10 Essential (primary) hypertension: Secondary | ICD-10-CM | POA: Diagnosis not present

## 2020-04-02 DIAGNOSIS — I480 Paroxysmal atrial fibrillation: Secondary | ICD-10-CM

## 2020-04-02 DIAGNOSIS — R0989 Other specified symptoms and signs involving the circulatory and respiratory systems: Secondary | ICD-10-CM

## 2020-04-02 DIAGNOSIS — I359 Nonrheumatic aortic valve disorder, unspecified: Secondary | ICD-10-CM

## 2020-04-02 NOTE — Patient Instructions (Addendum)
Medication Instructions:  No changes *If you need a refill on your cardiac medications before your next appointment, please call your pharmacy*  Follow-Up: At [facility], you and your health needs are our priority.  As part of our continuing mission to provide you with exceptional heart care, we have created designated Provider Care Teams.  These Care Teams include your primary Cardiologist (physician) and Advanced Practice Providers (APPs -  Physician Assistants and Nurse Practitioners) who all work together to provide you with the care you need, when you need it.  Your next appointment:   12 month(s) You will receive a reminder letter in the mail two months in advance. If you don't receive a letter, please call our office to schedule the follow-up appointment.  The format for your next appointment:   In Person  Provider:   Minus Breeding, MD   [place]

## 2020-04-03 DIAGNOSIS — D509 Iron deficiency anemia, unspecified: Secondary | ICD-10-CM | POA: Diagnosis not present

## 2020-04-03 DIAGNOSIS — N186 End stage renal disease: Secondary | ICD-10-CM | POA: Diagnosis not present

## 2020-04-03 DIAGNOSIS — N2581 Secondary hyperparathyroidism of renal origin: Secondary | ICD-10-CM | POA: Diagnosis not present

## 2020-04-03 DIAGNOSIS — D631 Anemia in chronic kidney disease: Secondary | ICD-10-CM | POA: Diagnosis not present

## 2020-04-04 ENCOUNTER — Ambulatory Visit (HOSPITAL_BASED_OUTPATIENT_CLINIC_OR_DEPARTMENT_OTHER)
Admission: RE | Admit: 2020-04-04 | Discharge: 2020-04-04 | Disposition: A | Discharge status: Home / Self Care | Payer: Medicare HMO | Source: Ambulatory Visit | Attending: Obstetrics & Gynecology | Admitting: Obstetrics & Gynecology

## 2020-04-04 ENCOUNTER — Other Ambulatory Visit: Payer: Self-pay

## 2020-04-04 DIAGNOSIS — N83202 Unspecified ovarian cyst, left side: Secondary | ICD-10-CM | POA: Insufficient documentation

## 2020-04-04 DIAGNOSIS — N83201 Unspecified ovarian cyst, right side: Secondary | ICD-10-CM | POA: Insufficient documentation

## 2020-04-04 DIAGNOSIS — N950 Postmenopausal bleeding: Secondary | ICD-10-CM | POA: Diagnosis not present

## 2020-04-05 DIAGNOSIS — D631 Anemia in chronic kidney disease: Secondary | ICD-10-CM | POA: Diagnosis not present

## 2020-04-05 DIAGNOSIS — D509 Iron deficiency anemia, unspecified: Secondary | ICD-10-CM | POA: Diagnosis not present

## 2020-04-05 DIAGNOSIS — N2581 Secondary hyperparathyroidism of renal origin: Secondary | ICD-10-CM | POA: Diagnosis not present

## 2020-04-05 DIAGNOSIS — N186 End stage renal disease: Secondary | ICD-10-CM | POA: Diagnosis not present

## 2020-04-08 DIAGNOSIS — N2581 Secondary hyperparathyroidism of renal origin: Secondary | ICD-10-CM | POA: Diagnosis not present

## 2020-04-08 DIAGNOSIS — N186 End stage renal disease: Secondary | ICD-10-CM | POA: Diagnosis not present

## 2020-04-08 DIAGNOSIS — D631 Anemia in chronic kidney disease: Secondary | ICD-10-CM | POA: Diagnosis not present

## 2020-04-08 DIAGNOSIS — D509 Iron deficiency anemia, unspecified: Secondary | ICD-10-CM | POA: Diagnosis not present

## 2020-04-09 DIAGNOSIS — M21372 Foot drop, left foot: Secondary | ICD-10-CM | POA: Diagnosis not present

## 2020-04-10 DIAGNOSIS — D631 Anemia in chronic kidney disease: Secondary | ICD-10-CM | POA: Diagnosis not present

## 2020-04-10 DIAGNOSIS — N2581 Secondary hyperparathyroidism of renal origin: Secondary | ICD-10-CM | POA: Diagnosis not present

## 2020-04-10 DIAGNOSIS — D509 Iron deficiency anemia, unspecified: Secondary | ICD-10-CM | POA: Diagnosis not present

## 2020-04-10 DIAGNOSIS — N186 End stage renal disease: Secondary | ICD-10-CM | POA: Diagnosis not present

## 2020-04-12 DIAGNOSIS — D631 Anemia in chronic kidney disease: Secondary | ICD-10-CM | POA: Diagnosis not present

## 2020-04-12 DIAGNOSIS — N186 End stage renal disease: Secondary | ICD-10-CM | POA: Diagnosis not present

## 2020-04-12 DIAGNOSIS — Z7901 Long term (current) use of anticoagulants: Secondary | ICD-10-CM | POA: Diagnosis not present

## 2020-04-12 DIAGNOSIS — N2581 Secondary hyperparathyroidism of renal origin: Secondary | ICD-10-CM | POA: Diagnosis not present

## 2020-04-12 DIAGNOSIS — D509 Iron deficiency anemia, unspecified: Secondary | ICD-10-CM | POA: Diagnosis not present

## 2020-04-15 DIAGNOSIS — N2581 Secondary hyperparathyroidism of renal origin: Secondary | ICD-10-CM | POA: Diagnosis not present

## 2020-04-15 DIAGNOSIS — D631 Anemia in chronic kidney disease: Secondary | ICD-10-CM | POA: Diagnosis not present

## 2020-04-15 DIAGNOSIS — N186 End stage renal disease: Secondary | ICD-10-CM | POA: Diagnosis not present

## 2020-04-15 DIAGNOSIS — Z7901 Long term (current) use of anticoagulants: Secondary | ICD-10-CM | POA: Diagnosis not present

## 2020-04-15 DIAGNOSIS — D509 Iron deficiency anemia, unspecified: Secondary | ICD-10-CM | POA: Diagnosis not present

## 2020-04-16 ENCOUNTER — Telehealth (HOSPITAL_BASED_OUTPATIENT_CLINIC_OR_DEPARTMENT_OTHER): Payer: Self-pay | Admitting: Obstetrics & Gynecology

## 2020-04-16 NOTE — Telephone Encounter (Signed)
Patient called today and said she had a miss called from you on Saturday and you wanted her call you back .

## 2020-04-17 DIAGNOSIS — N186 End stage renal disease: Secondary | ICD-10-CM | POA: Diagnosis not present

## 2020-04-17 DIAGNOSIS — D631 Anemia in chronic kidney disease: Secondary | ICD-10-CM | POA: Diagnosis not present

## 2020-04-17 DIAGNOSIS — D509 Iron deficiency anemia, unspecified: Secondary | ICD-10-CM | POA: Diagnosis not present

## 2020-04-17 DIAGNOSIS — N2581 Secondary hyperparathyroidism of renal origin: Secondary | ICD-10-CM | POA: Diagnosis not present

## 2020-04-18 DIAGNOSIS — G5732 Lesion of lateral popliteal nerve, left lower limb: Secondary | ICD-10-CM | POA: Diagnosis not present

## 2020-04-19 DIAGNOSIS — N2581 Secondary hyperparathyroidism of renal origin: Secondary | ICD-10-CM | POA: Diagnosis not present

## 2020-04-19 DIAGNOSIS — D631 Anemia in chronic kidney disease: Secondary | ICD-10-CM | POA: Diagnosis not present

## 2020-04-19 DIAGNOSIS — N186 End stage renal disease: Secondary | ICD-10-CM | POA: Diagnosis not present

## 2020-04-19 DIAGNOSIS — D509 Iron deficiency anemia, unspecified: Secondary | ICD-10-CM | POA: Diagnosis not present

## 2020-04-22 DIAGNOSIS — N2581 Secondary hyperparathyroidism of renal origin: Secondary | ICD-10-CM | POA: Diagnosis not present

## 2020-04-22 DIAGNOSIS — D509 Iron deficiency anemia, unspecified: Secondary | ICD-10-CM | POA: Diagnosis not present

## 2020-04-22 DIAGNOSIS — Z7901 Long term (current) use of anticoagulants: Secondary | ICD-10-CM | POA: Diagnosis not present

## 2020-04-22 DIAGNOSIS — N186 End stage renal disease: Secondary | ICD-10-CM | POA: Diagnosis not present

## 2020-04-22 DIAGNOSIS — D631 Anemia in chronic kidney disease: Secondary | ICD-10-CM | POA: Diagnosis not present

## 2020-04-24 DIAGNOSIS — N186 End stage renal disease: Secondary | ICD-10-CM | POA: Diagnosis not present

## 2020-04-24 DIAGNOSIS — D631 Anemia in chronic kidney disease: Secondary | ICD-10-CM | POA: Diagnosis not present

## 2020-04-24 DIAGNOSIS — D509 Iron deficiency anemia, unspecified: Secondary | ICD-10-CM | POA: Diagnosis not present

## 2020-04-24 DIAGNOSIS — N2581 Secondary hyperparathyroidism of renal origin: Secondary | ICD-10-CM | POA: Diagnosis not present

## 2020-04-26 DIAGNOSIS — N2581 Secondary hyperparathyroidism of renal origin: Secondary | ICD-10-CM | POA: Diagnosis not present

## 2020-04-26 DIAGNOSIS — D631 Anemia in chronic kidney disease: Secondary | ICD-10-CM | POA: Diagnosis not present

## 2020-04-26 DIAGNOSIS — D509 Iron deficiency anemia, unspecified: Secondary | ICD-10-CM | POA: Diagnosis not present

## 2020-04-26 DIAGNOSIS — N186 End stage renal disease: Secondary | ICD-10-CM | POA: Diagnosis not present

## 2020-04-29 DIAGNOSIS — N2581 Secondary hyperparathyroidism of renal origin: Secondary | ICD-10-CM | POA: Diagnosis not present

## 2020-04-29 DIAGNOSIS — N186 End stage renal disease: Secondary | ICD-10-CM | POA: Diagnosis not present

## 2020-04-29 DIAGNOSIS — D631 Anemia in chronic kidney disease: Secondary | ICD-10-CM | POA: Diagnosis not present

## 2020-04-29 DIAGNOSIS — D509 Iron deficiency anemia, unspecified: Secondary | ICD-10-CM | POA: Diagnosis not present

## 2020-04-30 DIAGNOSIS — E213 Hyperparathyroidism, unspecified: Secondary | ICD-10-CM | POA: Diagnosis not present

## 2020-04-30 DIAGNOSIS — Z008 Encounter for other general examination: Secondary | ICD-10-CM | POA: Diagnosis not present

## 2020-04-30 DIAGNOSIS — M1990 Unspecified osteoarthritis, unspecified site: Secondary | ICD-10-CM | POA: Diagnosis not present

## 2020-04-30 DIAGNOSIS — I120 Hypertensive chronic kidney disease with stage 5 chronic kidney disease or end stage renal disease: Secondary | ICD-10-CM | POA: Diagnosis not present

## 2020-04-30 DIAGNOSIS — E785 Hyperlipidemia, unspecified: Secondary | ICD-10-CM | POA: Diagnosis not present

## 2020-04-30 DIAGNOSIS — M810 Age-related osteoporosis without current pathological fracture: Secondary | ICD-10-CM | POA: Diagnosis not present

## 2020-04-30 DIAGNOSIS — Z992 Dependence on renal dialysis: Secondary | ICD-10-CM | POA: Diagnosis not present

## 2020-04-30 DIAGNOSIS — Z7901 Long term (current) use of anticoagulants: Secondary | ICD-10-CM | POA: Diagnosis not present

## 2020-04-30 DIAGNOSIS — N186 End stage renal disease: Secondary | ICD-10-CM | POA: Diagnosis not present

## 2020-04-30 DIAGNOSIS — D6869 Other thrombophilia: Secondary | ICD-10-CM | POA: Diagnosis not present

## 2020-04-30 DIAGNOSIS — I4891 Unspecified atrial fibrillation: Secondary | ICD-10-CM | POA: Diagnosis not present

## 2020-05-01 DIAGNOSIS — N186 End stage renal disease: Secondary | ICD-10-CM | POA: Diagnosis not present

## 2020-05-01 DIAGNOSIS — D631 Anemia in chronic kidney disease: Secondary | ICD-10-CM | POA: Diagnosis not present

## 2020-05-01 DIAGNOSIS — D509 Iron deficiency anemia, unspecified: Secondary | ICD-10-CM | POA: Diagnosis not present

## 2020-05-01 DIAGNOSIS — N2581 Secondary hyperparathyroidism of renal origin: Secondary | ICD-10-CM | POA: Diagnosis not present

## 2020-05-02 DIAGNOSIS — N186 End stage renal disease: Secondary | ICD-10-CM | POA: Diagnosis not present

## 2020-05-02 DIAGNOSIS — Z992 Dependence on renal dialysis: Secondary | ICD-10-CM | POA: Diagnosis not present

## 2020-05-03 DIAGNOSIS — N186 End stage renal disease: Secondary | ICD-10-CM | POA: Diagnosis not present

## 2020-05-03 DIAGNOSIS — D631 Anemia in chronic kidney disease: Secondary | ICD-10-CM | POA: Diagnosis not present

## 2020-05-03 DIAGNOSIS — D509 Iron deficiency anemia, unspecified: Secondary | ICD-10-CM | POA: Diagnosis not present

## 2020-05-03 DIAGNOSIS — N2581 Secondary hyperparathyroidism of renal origin: Secondary | ICD-10-CM | POA: Diagnosis not present

## 2020-05-06 DIAGNOSIS — N2581 Secondary hyperparathyroidism of renal origin: Secondary | ICD-10-CM | POA: Diagnosis not present

## 2020-05-06 DIAGNOSIS — D509 Iron deficiency anemia, unspecified: Secondary | ICD-10-CM | POA: Diagnosis not present

## 2020-05-06 DIAGNOSIS — N186 End stage renal disease: Secondary | ICD-10-CM | POA: Diagnosis not present

## 2020-05-06 DIAGNOSIS — D631 Anemia in chronic kidney disease: Secondary | ICD-10-CM | POA: Diagnosis not present

## 2020-05-08 DIAGNOSIS — N186 End stage renal disease: Secondary | ICD-10-CM | POA: Diagnosis not present

## 2020-05-08 DIAGNOSIS — N2581 Secondary hyperparathyroidism of renal origin: Secondary | ICD-10-CM | POA: Diagnosis not present

## 2020-05-08 DIAGNOSIS — D631 Anemia in chronic kidney disease: Secondary | ICD-10-CM | POA: Diagnosis not present

## 2020-05-08 DIAGNOSIS — D509 Iron deficiency anemia, unspecified: Secondary | ICD-10-CM | POA: Diagnosis not present

## 2020-05-10 DIAGNOSIS — N186 End stage renal disease: Secondary | ICD-10-CM | POA: Diagnosis not present

## 2020-05-10 DIAGNOSIS — D631 Anemia in chronic kidney disease: Secondary | ICD-10-CM | POA: Diagnosis not present

## 2020-05-10 DIAGNOSIS — N2581 Secondary hyperparathyroidism of renal origin: Secondary | ICD-10-CM | POA: Diagnosis not present

## 2020-05-10 DIAGNOSIS — D509 Iron deficiency anemia, unspecified: Secondary | ICD-10-CM | POA: Diagnosis not present

## 2020-05-13 DIAGNOSIS — N186 End stage renal disease: Secondary | ICD-10-CM | POA: Diagnosis not present

## 2020-05-13 DIAGNOSIS — D509 Iron deficiency anemia, unspecified: Secondary | ICD-10-CM | POA: Diagnosis not present

## 2020-05-13 DIAGNOSIS — N2581 Secondary hyperparathyroidism of renal origin: Secondary | ICD-10-CM | POA: Diagnosis not present

## 2020-05-13 DIAGNOSIS — D631 Anemia in chronic kidney disease: Secondary | ICD-10-CM | POA: Diagnosis not present

## 2020-05-15 DIAGNOSIS — D631 Anemia in chronic kidney disease: Secondary | ICD-10-CM | POA: Diagnosis not present

## 2020-05-15 DIAGNOSIS — N2581 Secondary hyperparathyroidism of renal origin: Secondary | ICD-10-CM | POA: Diagnosis not present

## 2020-05-15 DIAGNOSIS — D509 Iron deficiency anemia, unspecified: Secondary | ICD-10-CM | POA: Diagnosis not present

## 2020-05-15 DIAGNOSIS — N186 End stage renal disease: Secondary | ICD-10-CM | POA: Diagnosis not present

## 2020-05-17 DIAGNOSIS — D509 Iron deficiency anemia, unspecified: Secondary | ICD-10-CM | POA: Diagnosis not present

## 2020-05-17 DIAGNOSIS — N2581 Secondary hyperparathyroidism of renal origin: Secondary | ICD-10-CM | POA: Diagnosis not present

## 2020-05-17 DIAGNOSIS — N186 End stage renal disease: Secondary | ICD-10-CM | POA: Diagnosis not present

## 2020-05-17 DIAGNOSIS — D631 Anemia in chronic kidney disease: Secondary | ICD-10-CM | POA: Diagnosis not present

## 2020-05-20 DIAGNOSIS — D509 Iron deficiency anemia, unspecified: Secondary | ICD-10-CM | POA: Diagnosis not present

## 2020-05-20 DIAGNOSIS — N186 End stage renal disease: Secondary | ICD-10-CM | POA: Diagnosis not present

## 2020-05-20 DIAGNOSIS — D631 Anemia in chronic kidney disease: Secondary | ICD-10-CM | POA: Diagnosis not present

## 2020-05-20 DIAGNOSIS — Z7901 Long term (current) use of anticoagulants: Secondary | ICD-10-CM | POA: Diagnosis not present

## 2020-05-20 DIAGNOSIS — N2581 Secondary hyperparathyroidism of renal origin: Secondary | ICD-10-CM | POA: Diagnosis not present

## 2020-05-22 DIAGNOSIS — N186 End stage renal disease: Secondary | ICD-10-CM | POA: Diagnosis not present

## 2020-05-22 DIAGNOSIS — D631 Anemia in chronic kidney disease: Secondary | ICD-10-CM | POA: Diagnosis not present

## 2020-05-22 DIAGNOSIS — D509 Iron deficiency anemia, unspecified: Secondary | ICD-10-CM | POA: Diagnosis not present

## 2020-05-22 DIAGNOSIS — N2581 Secondary hyperparathyroidism of renal origin: Secondary | ICD-10-CM | POA: Diagnosis not present

## 2020-05-24 DIAGNOSIS — D509 Iron deficiency anemia, unspecified: Secondary | ICD-10-CM | POA: Diagnosis not present

## 2020-05-24 DIAGNOSIS — D631 Anemia in chronic kidney disease: Secondary | ICD-10-CM | POA: Diagnosis not present

## 2020-05-24 DIAGNOSIS — N186 End stage renal disease: Secondary | ICD-10-CM | POA: Diagnosis not present

## 2020-05-24 DIAGNOSIS — N2581 Secondary hyperparathyroidism of renal origin: Secondary | ICD-10-CM | POA: Diagnosis not present

## 2020-05-27 DIAGNOSIS — D631 Anemia in chronic kidney disease: Secondary | ICD-10-CM | POA: Diagnosis not present

## 2020-05-27 DIAGNOSIS — N186 End stage renal disease: Secondary | ICD-10-CM | POA: Diagnosis not present

## 2020-05-27 DIAGNOSIS — Z7901 Long term (current) use of anticoagulants: Secondary | ICD-10-CM | POA: Diagnosis not present

## 2020-05-27 DIAGNOSIS — D509 Iron deficiency anemia, unspecified: Secondary | ICD-10-CM | POA: Diagnosis not present

## 2020-05-27 DIAGNOSIS — N2581 Secondary hyperparathyroidism of renal origin: Secondary | ICD-10-CM | POA: Diagnosis not present

## 2020-05-29 DIAGNOSIS — D509 Iron deficiency anemia, unspecified: Secondary | ICD-10-CM | POA: Diagnosis not present

## 2020-05-29 DIAGNOSIS — N186 End stage renal disease: Secondary | ICD-10-CM | POA: Diagnosis not present

## 2020-05-29 DIAGNOSIS — D631 Anemia in chronic kidney disease: Secondary | ICD-10-CM | POA: Diagnosis not present

## 2020-05-29 DIAGNOSIS — N2581 Secondary hyperparathyroidism of renal origin: Secondary | ICD-10-CM | POA: Diagnosis not present

## 2020-05-31 ENCOUNTER — Other Ambulatory Visit: Payer: Self-pay | Admitting: Cardiology

## 2020-05-31 DIAGNOSIS — N186 End stage renal disease: Secondary | ICD-10-CM | POA: Diagnosis not present

## 2020-05-31 DIAGNOSIS — D509 Iron deficiency anemia, unspecified: Secondary | ICD-10-CM | POA: Diagnosis not present

## 2020-05-31 DIAGNOSIS — D631 Anemia in chronic kidney disease: Secondary | ICD-10-CM | POA: Diagnosis not present

## 2020-05-31 DIAGNOSIS — N2581 Secondary hyperparathyroidism of renal origin: Secondary | ICD-10-CM | POA: Diagnosis not present

## 2020-06-01 DIAGNOSIS — N186 End stage renal disease: Secondary | ICD-10-CM | POA: Diagnosis not present

## 2020-06-01 DIAGNOSIS — Z992 Dependence on renal dialysis: Secondary | ICD-10-CM | POA: Diagnosis not present

## 2020-06-03 DIAGNOSIS — D631 Anemia in chronic kidney disease: Secondary | ICD-10-CM | POA: Diagnosis not present

## 2020-06-03 DIAGNOSIS — Z7901 Long term (current) use of anticoagulants: Secondary | ICD-10-CM | POA: Diagnosis not present

## 2020-06-03 DIAGNOSIS — N2581 Secondary hyperparathyroidism of renal origin: Secondary | ICD-10-CM | POA: Diagnosis not present

## 2020-06-03 DIAGNOSIS — N186 End stage renal disease: Secondary | ICD-10-CM | POA: Diagnosis not present

## 2020-06-05 DIAGNOSIS — N2581 Secondary hyperparathyroidism of renal origin: Secondary | ICD-10-CM | POA: Diagnosis not present

## 2020-06-05 DIAGNOSIS — N186 End stage renal disease: Secondary | ICD-10-CM | POA: Diagnosis not present

## 2020-06-05 DIAGNOSIS — D631 Anemia in chronic kidney disease: Secondary | ICD-10-CM | POA: Diagnosis not present

## 2020-06-07 DIAGNOSIS — N186 End stage renal disease: Secondary | ICD-10-CM | POA: Diagnosis not present

## 2020-06-07 DIAGNOSIS — N2581 Secondary hyperparathyroidism of renal origin: Secondary | ICD-10-CM | POA: Diagnosis not present

## 2020-06-07 DIAGNOSIS — D631 Anemia in chronic kidney disease: Secondary | ICD-10-CM | POA: Diagnosis not present

## 2020-06-10 DIAGNOSIS — D631 Anemia in chronic kidney disease: Secondary | ICD-10-CM | POA: Diagnosis not present

## 2020-06-10 DIAGNOSIS — N186 End stage renal disease: Secondary | ICD-10-CM | POA: Diagnosis not present

## 2020-06-10 DIAGNOSIS — Z7901 Long term (current) use of anticoagulants: Secondary | ICD-10-CM | POA: Diagnosis not present

## 2020-06-10 DIAGNOSIS — N2581 Secondary hyperparathyroidism of renal origin: Secondary | ICD-10-CM | POA: Diagnosis not present

## 2020-06-11 ENCOUNTER — Ambulatory Visit: Payer: Medicare HMO | Admitting: Family Medicine

## 2020-06-12 DIAGNOSIS — N186 End stage renal disease: Secondary | ICD-10-CM | POA: Diagnosis not present

## 2020-06-12 DIAGNOSIS — N2581 Secondary hyperparathyroidism of renal origin: Secondary | ICD-10-CM | POA: Diagnosis not present

## 2020-06-12 DIAGNOSIS — D631 Anemia in chronic kidney disease: Secondary | ICD-10-CM | POA: Diagnosis not present

## 2020-06-14 DIAGNOSIS — N2581 Secondary hyperparathyroidism of renal origin: Secondary | ICD-10-CM | POA: Diagnosis not present

## 2020-06-14 DIAGNOSIS — N186 End stage renal disease: Secondary | ICD-10-CM | POA: Diagnosis not present

## 2020-06-14 DIAGNOSIS — D631 Anemia in chronic kidney disease: Secondary | ICD-10-CM | POA: Diagnosis not present

## 2020-06-17 DIAGNOSIS — D631 Anemia in chronic kidney disease: Secondary | ICD-10-CM | POA: Diagnosis not present

## 2020-06-17 DIAGNOSIS — Z7901 Long term (current) use of anticoagulants: Secondary | ICD-10-CM | POA: Diagnosis not present

## 2020-06-17 DIAGNOSIS — N2581 Secondary hyperparathyroidism of renal origin: Secondary | ICD-10-CM | POA: Diagnosis not present

## 2020-06-17 DIAGNOSIS — N186 End stage renal disease: Secondary | ICD-10-CM | POA: Diagnosis not present

## 2020-06-18 DIAGNOSIS — H524 Presbyopia: Secondary | ICD-10-CM | POA: Diagnosis not present

## 2020-06-18 DIAGNOSIS — H52223 Regular astigmatism, bilateral: Secondary | ICD-10-CM | POA: Diagnosis not present

## 2020-06-19 DIAGNOSIS — N186 End stage renal disease: Secondary | ICD-10-CM | POA: Diagnosis not present

## 2020-06-19 DIAGNOSIS — N2581 Secondary hyperparathyroidism of renal origin: Secondary | ICD-10-CM | POA: Diagnosis not present

## 2020-06-19 DIAGNOSIS — D631 Anemia in chronic kidney disease: Secondary | ICD-10-CM | POA: Diagnosis not present

## 2020-06-20 ENCOUNTER — Telehealth (HOSPITAL_BASED_OUTPATIENT_CLINIC_OR_DEPARTMENT_OTHER): Payer: Self-pay | Admitting: Obstetrics & Gynecology

## 2020-06-20 ENCOUNTER — Encounter (HOSPITAL_BASED_OUTPATIENT_CLINIC_OR_DEPARTMENT_OTHER): Payer: Self-pay | Admitting: Obstetrics & Gynecology

## 2020-06-20 NOTE — Telephone Encounter (Signed)
Patient called and wanted to know what was next .Patient seen you once in [place] in [facility]

## 2020-06-21 DIAGNOSIS — D631 Anemia in chronic kidney disease: Secondary | ICD-10-CM | POA: Diagnosis not present

## 2020-06-21 DIAGNOSIS — N186 End stage renal disease: Secondary | ICD-10-CM | POA: Diagnosis not present

## 2020-06-21 DIAGNOSIS — N2581 Secondary hyperparathyroidism of renal origin: Secondary | ICD-10-CM | POA: Diagnosis not present

## 2020-06-24 DIAGNOSIS — D631 Anemia in chronic kidney disease: Secondary | ICD-10-CM | POA: Diagnosis not present

## 2020-06-24 DIAGNOSIS — N186 End stage renal disease: Secondary | ICD-10-CM | POA: Diagnosis not present

## 2020-06-24 DIAGNOSIS — N2581 Secondary hyperparathyroidism of renal origin: Secondary | ICD-10-CM | POA: Diagnosis not present

## 2020-06-26 DIAGNOSIS — N2581 Secondary hyperparathyroidism of renal origin: Secondary | ICD-10-CM | POA: Diagnosis not present

## 2020-06-26 DIAGNOSIS — N186 End stage renal disease: Secondary | ICD-10-CM | POA: Diagnosis not present

## 2020-06-26 DIAGNOSIS — D631 Anemia in chronic kidney disease: Secondary | ICD-10-CM | POA: Diagnosis not present

## 2020-06-28 DIAGNOSIS — N2581 Secondary hyperparathyroidism of renal origin: Secondary | ICD-10-CM | POA: Diagnosis not present

## 2020-06-28 DIAGNOSIS — D631 Anemia in chronic kidney disease: Secondary | ICD-10-CM | POA: Diagnosis not present

## 2020-06-28 DIAGNOSIS — Z7901 Long term (current) use of anticoagulants: Secondary | ICD-10-CM | POA: Diagnosis not present

## 2020-06-28 DIAGNOSIS — N186 End stage renal disease: Secondary | ICD-10-CM | POA: Diagnosis not present

## 2020-07-01 DIAGNOSIS — N2581 Secondary hyperparathyroidism of renal origin: Secondary | ICD-10-CM | POA: Diagnosis not present

## 2020-07-01 DIAGNOSIS — Z7901 Long term (current) use of anticoagulants: Secondary | ICD-10-CM | POA: Diagnosis not present

## 2020-07-01 DIAGNOSIS — N186 End stage renal disease: Secondary | ICD-10-CM | POA: Diagnosis not present

## 2020-07-01 DIAGNOSIS — D631 Anemia in chronic kidney disease: Secondary | ICD-10-CM | POA: Diagnosis not present

## 2020-07-02 DIAGNOSIS — N186 End stage renal disease: Secondary | ICD-10-CM | POA: Diagnosis not present

## 2020-07-02 DIAGNOSIS — Z992 Dependence on renal dialysis: Secondary | ICD-10-CM | POA: Diagnosis not present

## 2020-07-03 DIAGNOSIS — N2581 Secondary hyperparathyroidism of renal origin: Secondary | ICD-10-CM | POA: Diagnosis not present

## 2020-07-03 DIAGNOSIS — N186 End stage renal disease: Secondary | ICD-10-CM | POA: Diagnosis not present

## 2020-07-03 DIAGNOSIS — D631 Anemia in chronic kidney disease: Secondary | ICD-10-CM | POA: Diagnosis not present

## 2020-07-03 DIAGNOSIS — D509 Iron deficiency anemia, unspecified: Secondary | ICD-10-CM | POA: Diagnosis not present

## 2020-07-05 DIAGNOSIS — N2581 Secondary hyperparathyroidism of renal origin: Secondary | ICD-10-CM | POA: Diagnosis not present

## 2020-07-05 DIAGNOSIS — N186 End stage renal disease: Secondary | ICD-10-CM | POA: Diagnosis not present

## 2020-07-05 DIAGNOSIS — D509 Iron deficiency anemia, unspecified: Secondary | ICD-10-CM | POA: Diagnosis not present

## 2020-07-05 DIAGNOSIS — D631 Anemia in chronic kidney disease: Secondary | ICD-10-CM | POA: Diagnosis not present

## 2020-07-08 DIAGNOSIS — N186 End stage renal disease: Secondary | ICD-10-CM | POA: Diagnosis not present

## 2020-07-08 DIAGNOSIS — N2581 Secondary hyperparathyroidism of renal origin: Secondary | ICD-10-CM | POA: Diagnosis not present

## 2020-07-08 DIAGNOSIS — D631 Anemia in chronic kidney disease: Secondary | ICD-10-CM | POA: Diagnosis not present

## 2020-07-08 DIAGNOSIS — D509 Iron deficiency anemia, unspecified: Secondary | ICD-10-CM | POA: Diagnosis not present

## 2020-07-08 DIAGNOSIS — Z7901 Long term (current) use of anticoagulants: Secondary | ICD-10-CM | POA: Diagnosis not present

## 2020-07-10 DIAGNOSIS — D509 Iron deficiency anemia, unspecified: Secondary | ICD-10-CM | POA: Diagnosis not present

## 2020-07-10 DIAGNOSIS — N186 End stage renal disease: Secondary | ICD-10-CM | POA: Diagnosis not present

## 2020-07-10 DIAGNOSIS — N2581 Secondary hyperparathyroidism of renal origin: Secondary | ICD-10-CM | POA: Diagnosis not present

## 2020-07-10 DIAGNOSIS — D631 Anemia in chronic kidney disease: Secondary | ICD-10-CM | POA: Diagnosis not present

## 2020-07-12 DIAGNOSIS — N2581 Secondary hyperparathyroidism of renal origin: Secondary | ICD-10-CM | POA: Diagnosis not present

## 2020-07-12 DIAGNOSIS — D509 Iron deficiency anemia, unspecified: Secondary | ICD-10-CM | POA: Diagnosis not present

## 2020-07-12 DIAGNOSIS — D631 Anemia in chronic kidney disease: Secondary | ICD-10-CM | POA: Diagnosis not present

## 2020-07-12 DIAGNOSIS — N186 End stage renal disease: Secondary | ICD-10-CM | POA: Diagnosis not present

## 2020-07-15 ENCOUNTER — Telehealth (HOSPITAL_BASED_OUTPATIENT_CLINIC_OR_DEPARTMENT_OTHER): Payer: Self-pay | Admitting: Obstetrics & Gynecology

## 2020-07-15 DIAGNOSIS — N186 End stage renal disease: Secondary | ICD-10-CM | POA: Diagnosis not present

## 2020-07-15 DIAGNOSIS — D509 Iron deficiency anemia, unspecified: Secondary | ICD-10-CM | POA: Diagnosis not present

## 2020-07-15 DIAGNOSIS — N2581 Secondary hyperparathyroidism of renal origin: Secondary | ICD-10-CM | POA: Diagnosis not present

## 2020-07-15 DIAGNOSIS — D631 Anemia in chronic kidney disease: Secondary | ICD-10-CM | POA: Diagnosis not present

## 2020-07-15 DIAGNOSIS — Z7901 Long term (current) use of anticoagulants: Secondary | ICD-10-CM | POA: Diagnosis not present

## 2020-07-15 NOTE — Telephone Encounter (Signed)
Patient called and  would like to know when will someone call to set up her surgery date with you.

## 2020-07-17 DIAGNOSIS — D631 Anemia in chronic kidney disease: Secondary | ICD-10-CM | POA: Diagnosis not present

## 2020-07-17 DIAGNOSIS — N186 End stage renal disease: Secondary | ICD-10-CM | POA: Diagnosis not present

## 2020-07-17 DIAGNOSIS — N2581 Secondary hyperparathyroidism of renal origin: Secondary | ICD-10-CM | POA: Diagnosis not present

## 2020-07-17 DIAGNOSIS — D509 Iron deficiency anemia, unspecified: Secondary | ICD-10-CM | POA: Diagnosis not present

## 2020-07-19 DIAGNOSIS — D631 Anemia in chronic kidney disease: Secondary | ICD-10-CM | POA: Diagnosis not present

## 2020-07-19 DIAGNOSIS — N2581 Secondary hyperparathyroidism of renal origin: Secondary | ICD-10-CM | POA: Diagnosis not present

## 2020-07-19 DIAGNOSIS — N186 End stage renal disease: Secondary | ICD-10-CM | POA: Diagnosis not present

## 2020-07-19 DIAGNOSIS — D509 Iron deficiency anemia, unspecified: Secondary | ICD-10-CM | POA: Diagnosis not present

## 2020-07-22 ENCOUNTER — Encounter (HOSPITAL_BASED_OUTPATIENT_CLINIC_OR_DEPARTMENT_OTHER): Payer: Self-pay | Admitting: *Deleted

## 2020-07-22 ENCOUNTER — Telehealth: Payer: Self-pay | Admitting: *Deleted

## 2020-07-22 DIAGNOSIS — Z7901 Long term (current) use of anticoagulants: Secondary | ICD-10-CM | POA: Diagnosis not present

## 2020-07-22 DIAGNOSIS — D509 Iron deficiency anemia, unspecified: Secondary | ICD-10-CM | POA: Diagnosis not present

## 2020-07-22 DIAGNOSIS — D631 Anemia in chronic kidney disease: Secondary | ICD-10-CM | POA: Diagnosis not present

## 2020-07-22 DIAGNOSIS — N2581 Secondary hyperparathyroidism of renal origin: Secondary | ICD-10-CM | POA: Diagnosis not present

## 2020-07-22 DIAGNOSIS — N186 End stage renal disease: Secondary | ICD-10-CM | POA: Diagnosis not present

## 2020-07-22 NOTE — Telephone Encounter (Signed)
Call to patient. Left message to call back to [phone] Filler. No clinical info left on voice mail.

## 2020-07-23 ENCOUNTER — Other Ambulatory Visit: Payer: Self-pay

## 2020-07-23 ENCOUNTER — Ambulatory Visit (INDEPENDENT_AMBULATORY_CARE_PROVIDER_SITE_OTHER): Payer: Medicare HMO | Admitting: Family Medicine

## 2020-07-23 ENCOUNTER — Telehealth: Payer: Self-pay | Admitting: *Deleted

## 2020-07-23 DIAGNOSIS — I480 Paroxysmal atrial fibrillation: Secondary | ICD-10-CM

## 2020-07-23 DIAGNOSIS — G4700 Insomnia, unspecified: Secondary | ICD-10-CM

## 2020-07-23 DIAGNOSIS — I10 Essential (primary) hypertension: Secondary | ICD-10-CM | POA: Diagnosis not present

## 2020-07-23 DIAGNOSIS — E782 Mixed hyperlipidemia: Secondary | ICD-10-CM

## 2020-07-23 DIAGNOSIS — N186 End stage renal disease: Secondary | ICD-10-CM

## 2020-07-23 MED ORDER — AMITRIPTYLINE HCL 10 MG PO TABS: 5.0000 mg | ORAL_TABLET | Freq: Every day | ORAL | 1 refills | Status: DC

## 2020-07-23 NOTE — Telephone Encounter (Signed)
Prior auth denied  Why did we deny your request? We denied this request under Medicare Part D because: The information provided by your prescriber did not meet the requirements for covering this medication (prior authorization). Your plan does not allow coverage of this medication based on your prescriber answering No to the following question(s): The [place] identifies the use of this medication as potentially inappropriate in older adults, meaning it is best avoided, prescribed at reduced dosage, or used with caution or carefully monitored. Is the requested drug being prescribed for the treatment of neuropathic pain? : The [place] identifies the use of this medication as potentially inappropriate in older adults, meaning it is best avoided, prescribed at reduced dosage, or used with caution or carefully monitored. Is the requested drug being prescribed for the treatment of depression? :   [facility] will run thru the 4.00 plan.

## 2020-07-23 NOTE — Patient Instructions (Addendum)
L Tryptophan Insomnia Insomnia is a sleep disorder that makes it difficult to fall asleep or stay asleep. Insomnia can cause fatigue, low energy, difficulty concentrating, moodswings, and poor performance at work or school. There are three different ways to classify insomnia: Difficulty falling asleep. Difficulty staying asleep. Waking up too early in the morning. Any type of insomnia can be long-term (chronic) or short-term (acute). Both are common. Short-term insomnia usually lasts for three months or less. Chronic insomnia occurs at least three times a week for longer than threemonths. What are the causes? Insomnia may be caused by another condition, situation, or substance, such as: Anxiety. Certain medicines. Gastroesophageal reflux disease (GERD) or other gastrointestinal conditions. Asthma or other breathing conditions. Restless legs syndrome, sleep apnea, or other sleep disorders. Chronic pain. Menopause. Stroke. Abuse of alcohol, tobacco, or illegal drugs. Mental health conditions, such as depression. Caffeine. Neurological disorders, such as Alzheimer's disease. An overactive thyroid (hyperthyroidism). Sometimes, the cause of insomnia may not be known. What increases the risk? Risk factors for insomnia include: Gender. Women are affected more often than men. Age. Insomnia is more common as you get older. Stress. Lack of exercise. Irregular work schedule or working night shifts. Traveling between different time zones. Certain medical and mental health conditions. What are the signs or symptoms? If you have insomnia, the main symptom is having trouble falling asleep or having trouble staying asleep. This may lead to other symptoms, such as: Feeling fatigued or having low energy. Feeling nervous about going to sleep. Not feeling rested in the morning. Having trouble concentrating. Feeling irritable, anxious, or depressed. How is this diagnosed? This condition may be  diagnosed based on: Your symptoms and medical history. Your health care provider may ask about: Your sleep habits. Any medical conditions you have. Your mental health. A physical exam. How is this treated? Treatment for insomnia depends on the cause. Treatment may focus on treating an underlying condition that is causing insomnia. Treatment may also include: Medicines to help you sleep. Counseling or therapy. Lifestyle adjustments to help you sleep better. Follow these instructions at home: Eating and drinking  Limit or avoid alcohol, caffeinated beverages, and cigarettes, especially close to bedtime. These can disrupt your sleep. Do not eat a large meal or eat spicy foods right before bedtime. This can lead to digestive discomfort that can make it hard for you to sleep.  Sleep habits  Keep a sleep diary to help you and your health care provider figure out what could be causing your insomnia. Write down: When you sleep. When you wake up during the night. How well you sleep. How rested you feel the next day. Any side effects of medicines you are taking. What you eat and drink. Make your bedroom a dark, comfortable place where it is easy to fall asleep. Put up shades or blackout curtains to block light from outside. Use a white noise machine to block noise. Keep the temperature cool. Limit screen use before bedtime. This includes: Watching TV. Using your smartphone, tablet, or computer. Stick to a routine that includes going to bed and waking up at the same times every day and night. This can help you fall asleep faster. Consider making a quiet activity, such as reading, part of your nighttime routine. Try to avoid taking naps during the day so that you sleep better at night. Get out of bed if you are still awake after 15 minutes of trying to sleep. Keep the lights down, but try reading or doing  a quiet activity. When you feel sleepy, go back to bed.  General instructions Take  over-the-counter and prescription medicines only as told by your health care provider. Exercise regularly, as told by your health care provider. Avoid exercise starting several hours before bedtime. Use relaxation techniques to manage stress. Ask your health care provider to suggest some techniques that may work well for you. These may include: Breathing exercises. Routines to release muscle tension. Visualizing peaceful scenes. Make sure that you drive carefully. Avoid driving if you feel very sleepy. Keep all follow-up visits as told by your health care provider. This is important. Contact a health care provider if: You are tired throughout the day. You have trouble in your daily routine due to sleepiness. You continue to have sleep problems, or your sleep problems get worse. Get help right away if: You have serious thoughts about hurting yourself or someone else. If you ever feel like you may hurt yourself or others, or have thoughts about taking your own life, get help right away. You can go to your nearest emergency department or call: Your local emergency services (911 in the [place] A suicide crisis helpline, such as the [facility] at 787-816-5672. This is open 24 hours a day. Summary Insomnia is a sleep disorder that makes it difficult to fall asleep or stay asleep. Insomnia can be long-term (chronic) or short-term (acute). Treatment for insomnia depends on the cause. Treatment may focus on treating an underlying condition that is causing insomnia. Keep a sleep diary to help you and your health care provider figure out what could be causing your insomnia. This information is not intended to replace advice given to you by your health care provider. Make sure you discuss any questions you have with your healthcare provider. Document Revised: 11/30/2019 Document Reviewed: 11/30/2019 Elsevier Patient Education  2022 [employer].

## 2020-07-23 NOTE — Progress Notes (Signed)
Patient ID: Jillian Hayes, female    DOB: October 11, 1945  Age: 75 y.o. MRN: [id number]    Subjective:  Subjective  HPI Jillian Hayes presents for office visit today for follow up on HTN and ESRD. She reports that she is having a bilateral oophorectomy on 08/28/2020 in preparation for her kidney transplant surgery because cysts were found on her ovaries. She denies any chest pain, SOB, fever, abdominal pain, cough, chills, sore throat, dysuria, urinary incontinence, back pain, HA, or N/VD. She states that she has been having trouble with sleep and that last night she has had only 2 hours of sleep. She states that her appetite has been good.  She expresses concern regarding her Warfarin 5 MG that her blood is getting too thin. She states that after a dialysis session she does not feel fatigue if she has slept good the night before, but if not she states that she goes to sleep after her appointment.   Review of Systems  Constitutional:  Negative for appetite change, chills, fatigue and fever.  HENT:  Negative for congestion, rhinorrhea, sinus pressure, sinus pain and sore throat.   Eyes:  Negative for pain.  Respiratory:  Negative for cough and shortness of breath.   Cardiovascular:  Negative for chest pain, palpitations and leg swelling.  Gastrointestinal:  Negative for abdominal pain, blood in stool, diarrhea, nausea and vomiting.  Genitourinary:  Negative for decreased urine volume, dysuria, flank pain, frequency, urgency, vaginal bleeding and vaginal discharge.  Musculoskeletal:  Negative for back pain.  Neurological:  Negative for headaches.  Psychiatric/Behavioral:  Positive for sleep disturbance.    History Past Medical History:  Diagnosis Date   Advanced care planning/counseling discussion 06/10/2014   05/31/2014 patient presents copy of HCP and Living Will   Anemia    iron deficiency   Anxiety    Atrial fibrillation ([place])    Benign fundic gland polyps of stomach    Bladder polyps  06/25/2010   Chronic headaches 06/24/2010   Depression 1991   hospitalized   ESRD (end stage renal disease) ([place]) 11/09/2015   History of chicken pox 06/25/2010   Hypercalcemia 02/18/2014   Hypertension    Insomnia 06/24/2010   Multiple chemical sensitivity syndrome 06/25/2010   Proteinuria 02/18/2014   Renal insufficiency 03/26/2011   Valvular heart disease 04/28/2016    She has a past surgical history that includes polyps on bladder removed (1972); cyst on left breast removed (Left, 1991); Nasal septum surgery (1986); Tonsillectomy (1962); Colonoscopy (2014); and Esophagogastroduodenoscopy (egd) with esophageal dilation (2014).   Her family history includes Allergic rhinitis in her brother; Breast cancer (age of onset: 29) in her mother; Diabetes in her maternal aunt, maternal uncle, and mother; Heart attack (age of onset: 42) in her paternal grandfather; Heart attack (age of onset: 94) in her father; Heart disease in her father; Hypertension in her brother and paternal grandmother; Melanoma (age of onset: 48) in her son; Nephrolithiasis in her father; Obesity in her paternal grandmother; Pancreatic cancer in her maternal grandmother.She reports that she has never smoked. She has never used smokeless tobacco. She reports that she does not drink alcohol and does not use drugs.  Current Outpatient Medications on File Prior to Visit  Medication Sig Dispense Refill   acetaminophen (TYLENOL) 500 MG tablet Take 500 mg by mouth every 6 (six) hours as needed for headache (pain).     Alfalfa 250 MG TABS Take 1,500 mg by mouth 3 (three) times daily.  atorvastatin (LIPITOR) 20 MG tablet Take 20 mg by mouth daily.     b complex vitamins capsule Take 1 capsule by mouth 2 (two) times daily with a meal.      carvedilol (COREG) 25 MG tablet Take 1 tablet by mouth twice daily 180 tablet 3   cinacalcet (SENSIPAR) 30 MG tablet Take by mouth.     diltiazem (CARDIZEM CD) 240 MG 24 hr capsule Take 240 mg by mouth  daily.     doxycycline (VIBRAMYCIN) 100 MG capsule Take 1 capsule (100 mg total) by mouth 2 (two) times daily. Take BID for 14 days.  Take with food as can cause GI distress. 14 capsule 0   Flaxseed, Linseed, (FLAX SEED OIL PO) Take 1 capsule 2 (two) times daily by mouth.      hydrALAZINE (APRESOLINE) 50 MG tablet Take 50 mg by mouth 2 (two) times daily.     L-GLUTAMINE PO Take 1 capsule by mouth daily after supper.      multivitamin (RENA-VIT) TABS tablet Take 1 tablet by mouth daily.     Probiotic Product (PROBIOTIC DAILY PO) Take 1 tablet by mouth daily with lunch.      sevelamer carbonate (RENVELA) 800 MG tablet Take 1,600 mg by mouth 3 (three) times daily.     vitamin C (ASCORBIC ACID) 500 MG tablet Take 500 mg by mouth daily as needed (immune system boost).     VITAMIN E PO Take 1 capsule by mouth daily with lunch.     warfarin (COUMADIN) 5 MG tablet TAKE 1 & 1/2 (ONE & ONE-HALF) TABLETS BY MOUTH ONCE DAILY AS DIRECTED 45 tablet 0   No current facility-administered medications on file prior to visit.     Objective:  Objective  Physical Exam Constitutional:      General: She is not in acute distress.    Appearance: Normal appearance. She is not ill-appearing or toxic-appearing.  HENT:     Head: Normocephalic and atraumatic.     Right Ear: Tympanic membrane, ear canal and external ear normal.     Left Ear: Tympanic membrane, ear canal and external ear normal.     Nose: No congestion or rhinorrhea.  Eyes:     Extraocular Movements: Extraocular movements intact.     Pupils: Pupils are equal, round, and reactive to light.  Cardiovascular:     Rate and Rhythm: Normal rate and regular rhythm.     Pulses: Normal pulses.     Heart sounds: Normal heart sounds. No murmur heard. Pulmonary:     Effort: Pulmonary effort is normal. No respiratory distress.     Breath sounds: Normal breath sounds. No wheezing, rhonchi or rales.  Abdominal:     General: Bowel sounds are normal.      Palpations: Abdomen is soft. There is no mass.     Tenderness: no abdominal tenderness There is no guarding.     Hernia: No hernia is present.  Musculoskeletal:        General: Normal range of motion.     Cervical back: Normal range of motion and neck supple.  Skin:    General: Skin is warm and dry.  Neurological:     Mental Status: She is alert and oriented to person, place, and time.  Psychiatric:        Behavior: Behavior normal.   BP 122/66   Pulse 69   Temp 98.1 F (36.7 C)   Resp 16   Wt 133 lb 3.2 oz (60.4  kg)   SpO2 98%   BMI 21.50 kg/m  Wt Readings from Last 3 Encounters:  07/23/20 133 lb 3.2 oz (60.4 kg)  04/02/20 133 lb (60.3 kg)  01/10/20 137 lb (62.1 kg)     Lab Results  Component Value Date   WBC 5.5 12/06/2019   HGB 12.0 12/06/2019   HCT 72 (A) 12/06/2019   PLT 193 12/06/2019   GLUCOSE 105 (H) 12/03/2019   CHOL 127 12/06/2019   TRIG 126 12/06/2019   HDL 84 (A) 11/08/2019   LDLCALC 30 11/08/2019   ALT 15 12/06/2019   AST 24 12/06/2019   NA 139 12/06/2019   K 4.4 12/06/2019   CL 103 12/06/2019   CREATININE 5.7 (A) 12/06/2019   BUN 59 (A) 12/06/2019   CO2 21 12/06/2019   TSH 0.25 (L) 09/20/2018   INR 2.4 11/17/2018   HGBA1C 5.7 (H) 10/25/2017    US Transvaginal Non-OB  Result Date: 04/04/2020 CLINICAL DATA:  Follow-up RIGHT ovarian cyst, postmenopausal EXAM: ULTRASOUND PELVIS TRANSVAGINAL TECHNIQUE: Transvaginal ultrasound examination of the pelvis was performed including evaluation of the uterus, ovaries, adnexal regions, and pelvic cul-de-sac. Transabdominal imaging was not ordered. COMPARISON:  01/18/2020 FINDINGS: Uterus Measurements: 4.7 x 2.6 x 3.6 cm = volume: 23 mL. Anteverted. Atrophic. Question small anterior wall intramural leiomyoma 13 mm greatest size. No additional masses. Endometrium Thickness: 3 mm.  No endometrial fluid or focal abnormality Right ovary No normal appearing RIGHT ovary visualized, see below Left ovary Not visualized,  likely obscured by bowel; numerous peristalsing bowel loops identified in LEFT adnexa at pelvis Other findings: Cyst identified within RIGHT adnexa 5.2 x 3.1 x 3.6 cm, simple in character. No complicating mural nodularity, internal echogenicity or septations. No free pelvic fluid. IMPRESSION: Simple cyst in RIGHT adnexa 5.2 cm in greatest diameter; this has increased in size since an earlier ultrasound of 10/21/2019 when it measured 4.3 cm in greatest diameter. Consider GYN consult and followup US in 3-6 months, or pelvis MRI w/o and w/ contrast for improved characterization. Note: This recommendation does not apply to premenarchal patients or to those with increased risk (genetic, family history, elevated tumor markers or other high-risk factors) of ovarian cancer. Reference: Radiology 2019 Nov; 293(2):359-371. Electronically Signed   By: Lavonia Dana M.D.   On: 04/04/2020 15:42     Assessment & Plan:  Plan    Meds ordered this encounter  Medications   amitriptyline (ELAVIL) 10 MG tablet    Sig: Take 0.5-2 tablets (5-20 mg total) by mouth at bedtime.    Dispense:  60 tablet    Refill:  1    Problem List Items Addressed This Visit     Insomnia    Encouraged good sleep hygiene such as dark, quiet room. No blue/green glowing lights such as computer screens in bedroom. No alcohol or stimulants in evening. Cut down on caffeine as able. Regular exercise is helpful but not just prior to bed time. Given a prescription for Amitriptyline 5-20 mg qhs prn       HTN (hypertension)    Well controlled, no changes to meds. Encouraged heart healthy diet such as the DASH diet and exercise as tolerated.        ESRD (end stage renal disease) ([place])    She continues to undergo dialysis and she has moved upt the transplant list. She hs had a couple calls recently but they have not worked out.        Hyperlipidemia    Encourage  heart healthy diet such as MIND or DASH diet, increase exercise, avoid trans  fats, simple carbohydrates and processed foods, consider a krill or fish or flaxseed oil cap daily.        Atrial fibrillation ([place])    Is having to go in weekly to have her INR weekly, she reports it was 1.9 this past weeki        Follow-up: Return in about 11 weeks (around 10/08/2020), or VV f/u medication adjustment.   I,David Hanna,acting as a scribe for [name], MD.,have documented all relevant documentation on the behalf of [name], MD,as directed by  [name], MD while in the presence of [name], MD.  I, [name], MD personally performed the services described in this documentation. All medical record entries made by the scribe were at my direction and in my presence. I have reviewed the chart and agree that the record reflects my personal performance and is accurate and complete

## 2020-07-23 NOTE — Telephone Encounter (Signed)
Prior auth started via cover my meds.  Awaiting determination.  Key: [id number]

## 2020-07-23 NOTE — Telephone Encounter (Signed)
Call to patient. Advised surgery is scheduled for 08-27-20 at 0930 am at [facility] arrival at 0730. Patient is on Coumadin and advised to speak to prescribing MD and nephrologist regarding date to discontinue.   Encounter closed.

## 2020-07-24 DIAGNOSIS — D631 Anemia in chronic kidney disease: Secondary | ICD-10-CM | POA: Diagnosis not present

## 2020-07-24 DIAGNOSIS — N2581 Secondary hyperparathyroidism of renal origin: Secondary | ICD-10-CM | POA: Diagnosis not present

## 2020-07-24 DIAGNOSIS — D509 Iron deficiency anemia, unspecified: Secondary | ICD-10-CM | POA: Diagnosis not present

## 2020-07-24 DIAGNOSIS — N186 End stage renal disease: Secondary | ICD-10-CM | POA: Diagnosis not present

## 2020-07-24 NOTE — Assessment & Plan Note (Signed)
Encouraged good sleep hygiene such as dark, quiet room. No blue/green glowing lights such as computer screens in bedroom. No alcohol or stimulants in evening. Cut down on caffeine as able. Regular exercise is helpful but not just prior to bed time. Given a prescription for Amitriptyline 5-20 mg qhs prn

## 2020-07-24 NOTE — Assessment & Plan Note (Signed)
Well controlled, no changes to meds. Encouraged heart healthy diet such as the DASH diet and exercise as tolerated.  °

## 2020-07-24 NOTE — Assessment & Plan Note (Signed)
Is having to go in weekly to have her INR weekly, she reports it was 1.9 this past weeki

## 2020-07-24 NOTE — Assessment & Plan Note (Signed)
She continues to undergo dialysis and she has moved upt the transplant list. She hs had a couple calls recently but they have not worked out.

## 2020-07-24 NOTE — Assessment & Plan Note (Signed)
Encourage heart healthy diet such as MIND or DASH diet, increase exercise, avoid trans fats, simple carbohydrates and processed foods, consider a krill or fish or flaxseed oil cap daily.  °

## 2020-07-25 ENCOUNTER — Telehealth: Payer: Self-pay | Admitting: Family Medicine

## 2020-07-25 NOTE — Telephone Encounter (Signed)
Patient states she forgot to mention to Dr.Blyth what would she recommend for her osteoporosis.

## 2020-07-26 ENCOUNTER — Other Ambulatory Visit: Payer: Self-pay | Admitting: Family Medicine

## 2020-07-26 DIAGNOSIS — N2581 Secondary hyperparathyroidism of renal origin: Secondary | ICD-10-CM | POA: Diagnosis not present

## 2020-07-26 DIAGNOSIS — D509 Iron deficiency anemia, unspecified: Secondary | ICD-10-CM | POA: Diagnosis not present

## 2020-07-26 DIAGNOSIS — N186 End stage renal disease: Secondary | ICD-10-CM | POA: Diagnosis not present

## 2020-07-26 DIAGNOSIS — D631 Anemia in chronic kidney disease: Secondary | ICD-10-CM | POA: Diagnosis not present

## 2020-07-26 MED ORDER — RALOXIFENE HCL 60 MG PO TABS: 60.0000 mg | ORAL_TABLET | Freq: Every day | ORAL | 5 refills | Status: DC

## 2020-07-26 NOTE — Telephone Encounter (Signed)
LVM to let her know medication was sent in

## 2020-07-29 DIAGNOSIS — D509 Iron deficiency anemia, unspecified: Secondary | ICD-10-CM | POA: Diagnosis not present

## 2020-07-29 DIAGNOSIS — N2581 Secondary hyperparathyroidism of renal origin: Secondary | ICD-10-CM | POA: Diagnosis not present

## 2020-07-29 DIAGNOSIS — N186 End stage renal disease: Secondary | ICD-10-CM | POA: Diagnosis not present

## 2020-07-29 DIAGNOSIS — D631 Anemia in chronic kidney disease: Secondary | ICD-10-CM | POA: Diagnosis not present

## 2020-07-29 DIAGNOSIS — Z7901 Long term (current) use of anticoagulants: Secondary | ICD-10-CM | POA: Diagnosis not present

## 2020-07-31 DIAGNOSIS — D509 Iron deficiency anemia, unspecified: Secondary | ICD-10-CM | POA: Diagnosis not present

## 2020-07-31 DIAGNOSIS — N2581 Secondary hyperparathyroidism of renal origin: Secondary | ICD-10-CM | POA: Diagnosis not present

## 2020-07-31 DIAGNOSIS — D631 Anemia in chronic kidney disease: Secondary | ICD-10-CM | POA: Diagnosis not present

## 2020-07-31 DIAGNOSIS — N186 End stage renal disease: Secondary | ICD-10-CM | POA: Diagnosis not present

## 2020-08-01 DIAGNOSIS — Z992 Dependence on renal dialysis: Secondary | ICD-10-CM | POA: Diagnosis not present

## 2020-08-01 DIAGNOSIS — N186 End stage renal disease: Secondary | ICD-10-CM | POA: Diagnosis not present

## 2020-08-02 DIAGNOSIS — D509 Iron deficiency anemia, unspecified: Secondary | ICD-10-CM | POA: Diagnosis not present

## 2020-08-02 DIAGNOSIS — N186 End stage renal disease: Secondary | ICD-10-CM | POA: Diagnosis not present

## 2020-08-02 DIAGNOSIS — D631 Anemia in chronic kidney disease: Secondary | ICD-10-CM | POA: Diagnosis not present

## 2020-08-02 DIAGNOSIS — N2581 Secondary hyperparathyroidism of renal origin: Secondary | ICD-10-CM | POA: Diagnosis not present

## 2020-08-05 DIAGNOSIS — D631 Anemia in chronic kidney disease: Secondary | ICD-10-CM | POA: Diagnosis not present

## 2020-08-05 DIAGNOSIS — N2581 Secondary hyperparathyroidism of renal origin: Secondary | ICD-10-CM | POA: Diagnosis not present

## 2020-08-05 DIAGNOSIS — D509 Iron deficiency anemia, unspecified: Secondary | ICD-10-CM | POA: Diagnosis not present

## 2020-08-05 DIAGNOSIS — Z7901 Long term (current) use of anticoagulants: Secondary | ICD-10-CM | POA: Diagnosis not present

## 2020-08-05 DIAGNOSIS — N186 End stage renal disease: Secondary | ICD-10-CM | POA: Diagnosis not present

## 2020-08-07 DIAGNOSIS — D509 Iron deficiency anemia, unspecified: Secondary | ICD-10-CM | POA: Diagnosis not present

## 2020-08-07 DIAGNOSIS — N2581 Secondary hyperparathyroidism of renal origin: Secondary | ICD-10-CM | POA: Diagnosis not present

## 2020-08-07 DIAGNOSIS — D631 Anemia in chronic kidney disease: Secondary | ICD-10-CM | POA: Diagnosis not present

## 2020-08-07 DIAGNOSIS — N186 End stage renal disease: Secondary | ICD-10-CM | POA: Diagnosis not present

## 2020-08-09 DIAGNOSIS — N186 End stage renal disease: Secondary | ICD-10-CM | POA: Diagnosis not present

## 2020-08-09 DIAGNOSIS — N2581 Secondary hyperparathyroidism of renal origin: Secondary | ICD-10-CM | POA: Diagnosis not present

## 2020-08-09 DIAGNOSIS — D631 Anemia in chronic kidney disease: Secondary | ICD-10-CM | POA: Diagnosis not present

## 2020-08-09 DIAGNOSIS — D509 Iron deficiency anemia, unspecified: Secondary | ICD-10-CM | POA: Diagnosis not present

## 2020-08-12 DIAGNOSIS — D509 Iron deficiency anemia, unspecified: Secondary | ICD-10-CM | POA: Diagnosis not present

## 2020-08-12 DIAGNOSIS — N186 End stage renal disease: Secondary | ICD-10-CM | POA: Diagnosis not present

## 2020-08-12 DIAGNOSIS — D631 Anemia in chronic kidney disease: Secondary | ICD-10-CM | POA: Diagnosis not present

## 2020-08-12 DIAGNOSIS — Z7901 Long term (current) use of anticoagulants: Secondary | ICD-10-CM | POA: Diagnosis not present

## 2020-08-12 DIAGNOSIS — N2581 Secondary hyperparathyroidism of renal origin: Secondary | ICD-10-CM | POA: Diagnosis not present

## 2020-08-13 ENCOUNTER — Encounter (HOSPITAL_BASED_OUTPATIENT_CLINIC_OR_DEPARTMENT_OTHER): Payer: Self-pay | Admitting: Obstetrics & Gynecology

## 2020-08-13 ENCOUNTER — Other Ambulatory Visit: Payer: Self-pay

## 2020-08-13 ENCOUNTER — Ambulatory Visit (INDEPENDENT_AMBULATORY_CARE_PROVIDER_SITE_OTHER): Payer: Medicare HMO | Admitting: Obstetrics & Gynecology

## 2020-08-13 VITALS — BP 142/60 | HR 76 | Ht 65.5 in | Wt 134.0 lb

## 2020-08-13 DIAGNOSIS — Z01818 Encounter for other preprocedural examination: Secondary | ICD-10-CM

## 2020-08-13 DIAGNOSIS — N186 End stage renal disease: Secondary | ICD-10-CM

## 2020-08-13 DIAGNOSIS — I770 Arteriovenous fistula, acquired: Secondary | ICD-10-CM

## 2020-08-13 DIAGNOSIS — I480 Paroxysmal atrial fibrillation: Secondary | ICD-10-CM

## 2020-08-13 DIAGNOSIS — D270 Benign neoplasm of right ovary: Secondary | ICD-10-CM

## 2020-08-14 ENCOUNTER — Other Ambulatory Visit (HOSPITAL_BASED_OUTPATIENT_CLINIC_OR_DEPARTMENT_OTHER): Payer: Self-pay | Admitting: Obstetrics & Gynecology

## 2020-08-14 DIAGNOSIS — D509 Iron deficiency anemia, unspecified: Secondary | ICD-10-CM | POA: Diagnosis not present

## 2020-08-14 DIAGNOSIS — N186 End stage renal disease: Secondary | ICD-10-CM | POA: Diagnosis not present

## 2020-08-14 DIAGNOSIS — N2581 Secondary hyperparathyroidism of renal origin: Secondary | ICD-10-CM | POA: Diagnosis not present

## 2020-08-14 DIAGNOSIS — D631 Anemia in chronic kidney disease: Secondary | ICD-10-CM | POA: Diagnosis not present

## 2020-08-14 NOTE — Progress Notes (Signed)
75 y.o. G2P2 Married White or Caucasian female here for discussion of upcoming procedure.   Laparoscopic BSO planned due to enlarging right simple ovarian cyst.  Last ultrasound was 04/04/2020 with right ovarian cyst measuring 5.2cm.  Due to size, removal planned.  Surgery is scheduled for 08/27/2020.  Ca-125 12/07/2019 was normal at 22.3.  In time surgery has been scheduled, she has declined two transplants as she is on the transplant list.  Pt wants to know if surgery can be done any sooner.  I have had another surgery cancellation for 7/19 and pt is willing to go earlier.  Will need to see if pre op appt can be changed to this week.  Pt does receive dialysis M/W/F.  Procedure discussed with patient.  Hospital stay, recovery and pain management all discussed.  Risks discussed including but not limited to bleeding, <1% risk of receiving a  transfusion, infection, rare risk of bowel/bladder/ureteral/vascular injury discussed as well as possible need for additional surgery if injury does occur discussed.  DVT/PE and rare risk of death discussed.  My actual complications with prior surgeries discussed.  Hernia formation discussed.  Positioning and incision locations discussed.  Patient aware if pathology abnormal she may need additional treatment.  All questions answered.     Ob Hx:   No LMP recorded. Patient is postmenopausal.          Last pap: 08/15/2019 Last MMG: 01/11/2020 Smoking: No   Past Surgical History:  Procedure Laterality Date   COLONOSCOPY  2014   cyst on left breast removed Left 1991   benign   ESOPHAGOGASTRODUODENOSCOPY (EGD) WITH ESOPHAGEAL DILATION  2014   NASAL SEPTUM SURGERY  1986   rhinoplasty   polyps on bladder removed  1972   benign   TONSILLECTOMY  1962    Past Medical History:  Diagnosis Date   Advanced care planning/counseling discussion 06/10/2014   05/31/2014 patient presents copy of HCP and Living Will   Anemia    iron deficiency   Anxiety    Atrial fibrillation  ([place])    Benign fundic gland polyps of stomach    Bladder polyps 06/25/2010   Chronic headaches 06/24/2010   Depression 1991   hospitalized   ESRD (end stage renal disease) ([place]) 11/09/2015   History of chicken pox 06/25/2010   Hypercalcemia 02/18/2014   Hypertension    Insomnia 06/24/2010   Multiple chemical sensitivity syndrome 06/25/2010   Proteinuria 02/18/2014   Renal insufficiency 03/26/2011   Valvular heart disease 04/28/2016    Allergies: Sulfa antibiotics and Clonidine derivatives  Current Outpatient Medications  Medication Sig Dispense Refill   acetaminophen (TYLENOL) 500 MG tablet Take 500 mg by mouth every 6 (six) hours as needed for headache (pain).     Alfalfa 250 MG TABS Take 1,500 mg by mouth 3 (three) times daily.     amitriptyline (ELAVIL) 10 MG tablet Take 0.5-2 tablets (5-20 mg total) by mouth at bedtime. 60 tablet 1   atorvastatin (LIPITOR) 20 MG tablet Take 20 mg by mouth daily.     b complex vitamins capsule Take 1 capsule by mouth 2 (two) times daily with a meal.      carvedilol (COREG) 25 MG tablet Take 1 tablet by mouth twice daily 180 tablet 3   cinacalcet (SENSIPAR) 30 MG tablet Take by mouth.     diltiazem (CARDIZEM CD) 240 MG 24 hr capsule Take 240 mg by mouth daily.     doxycycline (VIBRAMYCIN) 100 MG capsule Take 1 capsule (  100 mg total) by mouth 2 (two) times daily. Take BID for 14 days.  Take with food as can cause GI distress. 14 capsule 0   Flaxseed, Linseed, (FLAX SEED OIL PO) Take 1 capsule 2 (two) times daily by mouth.      hydrALAZINE (APRESOLINE) 50 MG tablet Take 50 mg by mouth 2 (two) times daily.     L-GLUTAMINE PO Take 1 capsule by mouth daily after supper.      multivitamin (RENA-VIT) TABS tablet Take 1 tablet by mouth daily.     Probiotic Product (PROBIOTIC DAILY PO) Take 1 tablet by mouth daily with lunch.      raloxifene (EVISTA) 60 MG tablet Take 1 tablet (60 mg total) by mouth daily. 30 tablet 5   sevelamer carbonate (RENVELA) 800 MG  tablet Take 1,600 mg by mouth 3 (three) times daily.     vitamin C (ASCORBIC ACID) 500 MG tablet Take 500 mg by mouth daily as needed (immune system boost).     VITAMIN E PO Take 1 capsule by mouth daily with lunch.     warfarin (COUMADIN) 5 MG tablet TAKE 1 & 1/2 (ONE & ONE-HALF) TABLETS BY MOUTH ONCE DAILY AS DIRECTED 45 tablet 0   No current facility-administered medications for this visit.    ROS: A comprehensive review of systems was negative.  Exam:   BP (!) 142/60   Pulse 76   Ht 5' 5.5" (1.664 m)   Wt 134 lb (60.8 kg)   BMI 21.96 kg/m   General appearance: alert and cooperative Head: Normocephalic, without obvious abnormality, atraumatic Neck: no adenopathy, supple, symmetrical, trachea midline and thyroid not enlarged, symmetric, no tenderness/mass/nodules Lungs: clear to auscultation bilaterally Heart: regular rate and rhythm, S1, S2 normal, no murmur, click, rub or gallop Abdomen: soft, non-tender; bowel sounds normal; no masses,  no organomegaly Extremities: extremities normal, atraumatic, no cyanosis or edema Skin: Skin color, texture, turgor normal. No rashes or lesions Lymph nodes: Cervical, supraclavicular, and axillary nodes normal. no inguinal nodes palpated Neurologic: Grossly normal  Pelvic: deferred  Chaperone, Octaviano Batty, present for examination.   Assessment/Plan: 1. Ovary, benign neoplasm, right - will hopefully moved surgery to 7/19.  Will need to call pre op for appt as well  2. Paroxysmal atrial fibrillation ([facility]) - have left message with Dr. Neta Ehlers (pt says he managed coumadin now) for recommendations regarding stopping prior to surgery  3. ESRD (end stage renal disease) ([place]) - on transplant list  4. A-V fistula ([place])

## 2020-08-14 NOTE — Pre-Procedure Instructions (Signed)
Surgical Instructions    Your procedure is scheduled on Tuesday July 19th.   Report to [facility] Main Entrance "A" at 08:00 A.M., then check in with the Admitting office.  Call this number if you have problems the morning of surgery:  [phone]   If you have any questions prior to your surgery date call [phone]: Open Monday-Friday 8am-4pm    Remember:  Do not eat after midnight the night before your surgery  You may drink clear liquids until 07:00 A.M. the morning of your surgery.   Clear liquids allowed are: Water, Non-Citrus Juices (without pulp), Carbonated Beverages, Clear Tea, Black Coffee Only, and Gatorade    Take these medicines the morning of surgery with A SIP OF WATER   atorvastatin (LIPITOR)  carvedilol (COREG)  cinacalcet (SENSIPAR)  diltiazem (CARDIZEM CD)  raloxifene (EVISTA)   acetaminophen (TYLENOL)- If needed   As of today, STOP taking any Aspirin (unless otherwise instructed by your surgeon) Aleve, Naproxen, Ibuprofen, Motrin, Advil, Goody's, BC's, all herbal medications, fish oil, and all vitamins.  Please follow your surgeon's instructions on when to stop taking warfarin (COUMADIN). If you have not received instructions then please contact your surgeon for instructions.                      Do NOT Smoke (Tobacco/Vaping) or drink Alcohol 24 hours prior to your procedure.  If you use a CPAP at night, you may bring all equipment for your overnight stay.   Contacts, glasses, piercing's, hearing aid's, dentures or partials may not be worn into surgery, please bring cases for these belongings.    For patients admitted to the hospital, discharge time will be determined by your treatment team.   Patients discharged the day of surgery will not be allowed to drive home, and someone needs to stay with them for 24 hours.  ONLY 1 SUPPORT PERSON MAY BE PRESENT WHILE YOU ARE IN SURGERY. IF YOU ARE TO BE ADMITTED ONCE YOU ARE IN YOUR ROOM YOU WILL BE ALLOWED  TWO (2) VISITORS.  Minor children may have two parents present. Special consideration for safety and communication needs will be reviewed on a case by case basis.   Special instructions:   [place]- Preparing For Surgery  Before surgery, you can play an important role. Because skin is not sterile, your skin needs to be as free of germs as possible. You can reduce the number of germs on your skin by washing with CHG (chlorahexidine gluconate) Soap before surgery.  CHG is an antiseptic cleaner which kills germs and bonds with the skin to continue killing germs even after washing.    Oral Hygiene is also important to reduce your risk of infection.  Remember - BRUSH YOUR TEETH THE MORNING OF SURGERY WITH YOUR REGULAR TOOTHPASTE  Please do not use if you have an allergy to CHG or antibacterial soaps. If your skin becomes reddened/irritated stop using the CHG.  Do not shave (including legs and underarms) for at least 48 hours prior to first CHG shower. It is OK to shave your face.  Please follow these instructions carefully.   Shower the NIGHT BEFORE SURGERY and the MORNING OF SURGERY  If you chose to wash your hair, wash your hair first as usual with your normal shampoo.  After you shampoo, rinse your hair and body thoroughly to remove the shampoo.  Use CHG Soap as you would any other liquid soap. You can apply CHG directly to the  skin and wash gently with a scrungie or a clean washcloth.   Apply the CHG Soap to your body ONLY FROM THE NECK DOWN.  Do not use on open wounds or open sores. Avoid contact with your eyes, ears, mouth and genitals (private parts). Wash Face and genitals (private parts)  with your normal soap.   Wash thoroughly, paying special attention to the area where your surgery will be performed.  Thoroughly rinse your body with warm water from the neck down.  DO NOT shower/wash with your normal soap after using and rinsing off the CHG Soap.  Pat yourself dry with a CLEAN  TOWEL.  Wear CLEAN PAJAMAS to bed the night before surgery  Place CLEAN SHEETS on your bed the night before your surgery  DO NOT SLEEP WITH PETS.   Day of Surgery: Shower with CHG soap. Do not wear jewelry, make up, nail polish, gel polish, artificial nails, or any other type of covering on natural nails including finger and toenails. If patients have artificial nails, gel coating, etc. that need to be removed by a nail salon please have this removed prior to surgery. Surgery may need to be canceled/delayed if the surgeon/ anesthesia feels like the patient is unable to be adequately monitored. Do not wear lotions, powders, perfumes/colognes, or deodorant. Do not shave 48 hours prior to surgery.  Men may shave face and neck. Do not bring valuables to the hospital. [facility] is not responsible for any belongings or valuables. Wear Clean/Comfortable clothing the morning of surgery Remember to brush your teeth WITH YOUR REGULAR TOOTHPASTE.   Please read over the following fact sheets that you were given.

## 2020-08-15 ENCOUNTER — Encounter (HOSPITAL_COMMUNITY)
Admission: RE | Admit: 2020-08-15 | Discharge: 2020-08-15 | Disposition: A | Discharge status: Home / Self Care | Payer: Medicare HMO | Source: Ambulatory Visit | Attending: Obstetrics & Gynecology | Admitting: Obstetrics & Gynecology

## 2020-08-15 ENCOUNTER — Encounter (HOSPITAL_COMMUNITY): Payer: Self-pay

## 2020-08-15 ENCOUNTER — Telehealth (HOSPITAL_BASED_OUTPATIENT_CLINIC_OR_DEPARTMENT_OTHER): Payer: Self-pay | Admitting: *Deleted

## 2020-08-15 ENCOUNTER — Telehealth: Payer: Self-pay | Admitting: Cardiology

## 2020-08-15 ENCOUNTER — Other Ambulatory Visit: Payer: Self-pay

## 2020-08-15 DIAGNOSIS — Z01812 Encounter for preprocedural laboratory examination: Secondary | ICD-10-CM | POA: Insufficient documentation

## 2020-08-15 HISTORY — DX: Unspecified osteoarthritis, unspecified site: M19.90

## 2020-08-15 HISTORY — DX: Other complications of anesthesia, initial encounter: T88.59XA

## 2020-08-15 LAB — COMPREHENSIVE METABOLIC PANEL
ALT: 20 U/L (ref 0–44)
AST: 31 U/L (ref 15–41)
Albumin: 3.6 g/dL (ref 3.5–5.0)
Alkaline Phosphatase: 80 U/L (ref 38–126)
Anion gap: 11 (ref 5–15)
BUN: 44 mg/dL — ABNORMAL HIGH (ref 8–23)
CO2: 27 mmol/L (ref 22–32)
Calcium: 9.4 mg/dL (ref 8.9–10.3)
Chloride: 101 mmol/L (ref 98–111)
Creatinine, Ser: 5.44 mg/dL — ABNORMAL HIGH (ref 0.44–1.00)
GFR, Estimated: 8 mL/min — ABNORMAL LOW (ref >60)
Glucose, Bld: 110 mg/dL — ABNORMAL HIGH (ref 70–99)
Potassium: 4.1 mmol/L (ref 3.5–5.1)
Sodium: 139 mmol/L (ref 135–145)
Total Bilirubin: 0.7 mg/dL (ref 0.3–1.2)
Total Protein: 6.2 g/dL — ABNORMAL LOW (ref 6.5–8.1)

## 2020-08-15 LAB — CBC
HCT: 39.1 % (ref 36.0–46.0)
Hemoglobin: 12.1 g/dL (ref 12.0–15.0)
MCH: 32.1 pg (ref 26.0–34.0)
MCHC: 30.9 g/dL (ref 30.0–36.0)
MCV: 103.7 fL — ABNORMAL HIGH (ref 80.0–100.0)
Platelets: 187 10*3/uL (ref 150–400)
RBC: 3.77 MIL/uL — ABNORMAL LOW (ref 3.87–5.11)
RDW: 16.1 % — ABNORMAL HIGH (ref 11.5–15.5)
WBC: 4.6 10*3/uL (ref 4.0–10.5)
nRBC: 0 % (ref 0.0–0.2)

## 2020-08-15 LAB — TYPE AND SCREEN
ABO/RH(D): O POS
Antibody Screen: NEGATIVE

## 2020-08-15 NOTE — Telephone Encounter (Signed)
   [facility] Pre-operative Risk Assessment    Request for surgical clearance:  What type of surgery is being performed?  Laparoscopic Bilateral Salpingo Oophorectomy   When is this surgery scheduled?  08/20/20   What type of clearance is required (medical clearance vs. Pharmacy clearance to hold med vs. Both)?  Both   Are there any medications that need to be held prior to surgery and how long?  Coumadin, 5 days prior   Practice name and name of physician performing surgery?  [facility]  Dr. Sabra Heck   What is your office phone number? [phone]    7.   What is your office fax number? [phone]  8.   Anesthesia type (None, local, MAC, general) ?  Choice    Zara Council 08/15/2020, 10:47 AM

## 2020-08-15 NOTE — Telephone Encounter (Signed)
Pt advised to refrain from taking coumadin prior to surgery until we hear back from her nephrologist.

## 2020-08-15 NOTE — Telephone Encounter (Signed)
Patient with diagnosis of atrial fibrillation on warfarin for anticoagulation.    Procedure: laparoscopic bilateral salpingo oophorectomy Date of procedure: 08/20/20   CHA2DS2-VASc Score = 3  This indicates a 3.2% annual risk of stroke. The patient's score is based upon: CHF History: No HTN History: Yes Diabetes History: No Stroke History: No Vascular Disease History: No Age Score: 1 Gender Score: 1   CrCl 7 Platelet count 152  Per office protocol, patient can hold warfairn for 5 days prior to procedure.   Patient will not need bridging with Lovenox (enoxaparin) around procedure.

## 2020-08-15 NOTE — Progress Notes (Signed)
PCP - Dr. [name] Cardiologist - Dr. Percival Spanish Vascular - Dr. Radene Knee Nephrology - Dr. Neta Ehlers; HD MWF, AV fistula left upper arm  PPM/ICD - n/a Device Orders -  Rep Notified -   Chest x-ray - n/a EKG - 04/02/20 Stress Test - patient denies ECHO - 04/13/19 Cardiac Cath - patient denies  Sleep Study - n/a CPAP -   Fasting Blood Sugar - n/a Checks Blood Sugar _____ times a day  Blood Thinner Instructions: Last dose of coumadin was on 08/14/20 Aspirin Instructions: n/a  ERAS Protcol - clears until 0700 DOS PRE-SURGERY Ensure or G2- none ordered  COVID TEST- scheduled for 08/16/20 at testing drive thru   Anesthesia review: yes, cardiac history of valvular disease and echo in 04/2019  Patient denies shortness of breath, fever, cough and chest pain at PAT appointment   All instructions explained to the patient, with a verbal understanding of the material. Patient agrees to go over the instructions while at home for a better understanding. Patient also instructed to self quarantine after being tested for COVID-19. The opportunity to ask questions was provided.

## 2020-08-15 NOTE — Telephone Encounter (Signed)
   Name: Jillian Hayes  DOB: 1945/10/09  MRN: [id number]   Primary Cardiologist: Minus Breeding, MD  Chart reviewed as part of pre-operative protocol coverage.   Left a voicemail for patient to call back for ongoing preop assessment. Advised patient to hold coumadin starting today in anticipation of her 08/20/20 surgery. Will finalize preop recommendations after speaking with patient.   Abigail Butts, PA-C 08/15/2020, 4:25 PM

## 2020-08-15 NOTE — Pre-Procedure Instructions (Signed)
Surgical Instructions    Your procedure is scheduled on Tuesday July 19th.   Report to [facility] Main Entrance "A" at 08:00 A.M., then check in with the Admitting office.  Call this number if you have problems the morning of surgery:  [phone]   If you have any questions prior to your surgery date call [phone]: Open Monday-Friday 8am-4pm    Remember:  Do not eat after midnight the night before your surgery  You may drink clear liquids until 07:00 A.M. the morning of your surgery.   Clear liquids allowed are: Water, Non-Citrus Juices (without pulp), Carbonated Beverages, Clear Tea, Black Coffee Only, and Gatorade    Take these medicines the morning of surgery with A SIP OF WATER   carvedilol (COREG)  diltiazem (CARDIZEM CD)  Hydralazine (Apresoline)   acetaminophen (TYLENOL)- If needed   As of today, STOP taking any Aspirin (unless otherwise instructed by your surgeon) Aleve, Naproxen, Ibuprofen, Motrin, Advil, Goody's, BC's, all herbal medications, fish oil, and all vitamins.  Please follow your surgeon's instructions on when to stop taking warfarin (COUMADIN). If you have not received instructions then please contact your surgeon for instructions.                      Do NOT Smoke (Tobacco/Vaping) or drink Alcohol 24 hours prior to your procedure.  If you use a CPAP at night, you may bring all equipment for your overnight stay.   Contacts, glasses, piercing's, hearing aid's, dentures or partials may not be worn into surgery, please bring cases for these belongings.    For patients admitted to the hospital, discharge time will be determined by your treatment team.   Patients discharged the day of surgery will not be allowed to drive home, and someone needs to stay with them for 24 hours.  ONLY 1 SUPPORT PERSON MAY BE PRESENT WHILE YOU ARE IN SURGERY. IF YOU ARE TO BE ADMITTED ONCE YOU ARE IN YOUR ROOM YOU WILL BE ALLOWED TWO (2) VISITORS.  Minor children may have two  parents present. Special consideration for safety and communication needs will be reviewed on a case by case basis.   Special instructions:   [place]- Preparing For Surgery  Before surgery, you can play an important role. Because skin is not sterile, your skin needs to be as free of germs as possible. You can reduce the number of germs on your skin by washing with CHG (chlorahexidine gluconate) Soap before surgery.  CHG is an antiseptic cleaner which kills germs and bonds with the skin to continue killing germs even after washing.    Oral Hygiene is also important to reduce your risk of infection.  Remember - BRUSH YOUR TEETH THE MORNING OF SURGERY WITH YOUR REGULAR TOOTHPASTE  Please do not use if you have an allergy to CHG or antibacterial soaps. If your skin becomes reddened/irritated stop using the CHG.  Do not shave (including legs and underarms) for at least 48 hours prior to first CHG shower. It is OK to shave your face.  Please follow these instructions carefully.   Shower the NIGHT BEFORE SURGERY and the MORNING OF SURGERY  If you chose to wash your hair, wash your hair first as usual with your normal shampoo.  After you shampoo, rinse your hair and body thoroughly to remove the shampoo.  Use CHG Soap as you would any other liquid soap. You can apply CHG directly to the skin and wash gently with a  scrungie or a clean washcloth.   Apply the CHG Soap to your body ONLY FROM THE NECK DOWN.  Do not use on open wounds or open sores. Avoid contact with your eyes, ears, mouth and genitals (private parts). Wash Face and genitals (private parts)  with your normal soap.   Wash thoroughly, paying special attention to the area where your surgery will be performed.  Thoroughly rinse your body with warm water from the neck down.  DO NOT shower/wash with your normal soap after using and rinsing off the CHG Soap.  Pat yourself dry with a CLEAN TOWEL.  Wear CLEAN PAJAMAS to bed the night  before surgery  Place CLEAN SHEETS on your bed the night before your surgery  DO NOT SLEEP WITH PETS.   Day of Surgery: Shower with CHG soap. Do not wear jewelry, make up, nail polish, gel polish, artificial nails, or any other type of covering on natural nails including finger and toenails. If patients have artificial nails, gel coating, etc. that need to be removed by a nail salon please have this removed prior to surgery. Surgery may need to be canceled/delayed if the surgeon/ anesthesia feels like the patient is unable to be adequately monitored. Do not wear lotions, powders, perfumes/colognes, or deodorant. Do not shave 48 hours prior to surgery.  Men may shave face and neck. Do not bring valuables to the hospital. [facility] is not responsible for any belongings or valuables. Wear Clean/Comfortable clothing the morning of surgery Remember to brush your teeth WITH YOUR REGULAR TOOTHPASTE.   Please read over the following fact sheets that you were given.

## 2020-08-16 ENCOUNTER — Other Ambulatory Visit (HOSPITAL_COMMUNITY)
Admission: RE | Admit: 2020-08-16 | Discharge: 2020-08-16 | Disposition: A | Discharge status: Home / Self Care | Payer: Medicare HMO | Source: Ambulatory Visit | Attending: Obstetrics & Gynecology | Admitting: Obstetrics & Gynecology

## 2020-08-16 DIAGNOSIS — Z01812 Encounter for preprocedural laboratory examination: Secondary | ICD-10-CM | POA: Insufficient documentation

## 2020-08-16 DIAGNOSIS — N2581 Secondary hyperparathyroidism of renal origin: Secondary | ICD-10-CM | POA: Diagnosis not present

## 2020-08-16 DIAGNOSIS — Z20822 Contact with and (suspected) exposure to covid-19: Secondary | ICD-10-CM | POA: Insufficient documentation

## 2020-08-16 DIAGNOSIS — D631 Anemia in chronic kidney disease: Secondary | ICD-10-CM | POA: Diagnosis not present

## 2020-08-16 DIAGNOSIS — N186 End stage renal disease: Secondary | ICD-10-CM | POA: Diagnosis not present

## 2020-08-16 DIAGNOSIS — D509 Iron deficiency anemia, unspecified: Secondary | ICD-10-CM | POA: Diagnosis not present

## 2020-08-16 LAB — SARS CORONAVIRUS 2 (TAT 6-24 HRS): SARS Coronavirus 2: NEGATIVE

## 2020-08-16 NOTE — Progress Notes (Addendum)
Anesthesia Chart Review:  Follows with cardiology for history of HTN, paroxysmal A. fib maintained on Coumadin. She was admitted 9/23-9/25/2019 for atrial fibrillation, RVR.  She spontaneously converted to sinus rhythm and was started on Cardizem and Coumadin.  Echo showed mod MR and AI.  She is on the transplant list for a kidney.  She had a work-up at [facility].  In October 2021 she had a perfusion study which was negative for ischemia.  Echo in December 2021 demonstrated well-preserved ejection fraction, mild AR, trace TR.  Cardiac clearance per telephone encounter 08/19/20, "Chart reviewed as part of pre-operative protocol coverage. Given past medical history and time since last visit, based on ACC/AHA guidelines, ALLIZON WOZNICK would be at acceptable risk for the planned procedure without further cardiovascular testing."  Per pharmacy note 08/15/2020, patient can hold warfarin 5 days prior to procedure and does not need Lovenox bridging.  ESRD on HD Monday Wednesday Friday.  Preop labs reviewed, consistent with ESRD. Otherwise unremarkable.   EKG 04/02/2020: Sinus rhythm with PACs.  Rate 64.  Possible LAE.  LVH.  Nonspecific ST abnormality.  TTE 01/13/2020 (Care Everywhere): SUMMARY  NPS.  Mild left ventricular hypertrophy  The left ventricular size is normal.  LV ejection fraction = 60-65%.  Left ventricular systolic function is normal.  The left atrium is moderately dilated.  The right atrium is mildly dilated.  There is mild aortic regurgitation.  There is mild tricuspid regurgitation.  Estimated right ventricular systolic pressure is 35 mmHg.  IVC size was mildly dilated.   Nuclear stress 11/23/2019 (Care Everywhere): Impression: No inducible ischemia or infarction. Normal left ventricular function.     Jillian Hayes [facility] Short Stay Center/Anesthesiology Phone [phone] 08/19/2020 10:58 AM

## 2020-08-16 NOTE — Telephone Encounter (Signed)
   Name: Jillian Hayes  DOB: [birth year]  MRN: [id number]   Primary Cardiologist: Minus Breeding, MD  Chart reviewed as part of pre-operative protocol coverage.   Left voicemails on both patient's cell and husband cell (DPR on file) asking patient to call back for ongoing preop assessment so as not to delay her procedure scheduled for 08/20/20. Await call back.   Abigail Butts, PA-C 08/16/2020, 3:26 PM

## 2020-08-19 DIAGNOSIS — Z7901 Long term (current) use of anticoagulants: Secondary | ICD-10-CM | POA: Diagnosis not present

## 2020-08-19 DIAGNOSIS — D631 Anemia in chronic kidney disease: Secondary | ICD-10-CM | POA: Diagnosis not present

## 2020-08-19 DIAGNOSIS — D509 Iron deficiency anemia, unspecified: Secondary | ICD-10-CM | POA: Diagnosis not present

## 2020-08-19 DIAGNOSIS — N186 End stage renal disease: Secondary | ICD-10-CM | POA: Diagnosis not present

## 2020-08-19 DIAGNOSIS — N2581 Secondary hyperparathyroidism of renal origin: Secondary | ICD-10-CM | POA: Diagnosis not present

## 2020-08-19 NOTE — Progress Notes (Signed)
Patient voiced understanding of new arrival time of 57

## 2020-08-19 NOTE — Telephone Encounter (Signed)
   Primary Cardiologist: Minus Breeding, MD  Chart reviewed as part of pre-operative protocol coverage. Given past medical history and time since last visit, based on ACC/AHA guidelines, Jillian Hayes would be at acceptable risk for the planned procedure without further cardiovascular testing.   Patient with diagnosis of atrial fibrillation on warfarin for anticoagulation.     Procedure: laparoscopic bilateral salpingo oophorectomy Date of procedure: 08/20/20     CHA2DS2-VASc Score = 3  This indicates a 3.2% annual risk of stroke. The patient's score is based upon: CHF History: No HTN History: Yes Diabetes History: No Stroke History: No Vascular Disease History: No Age Score: 1 Gender Score: 1    CrCl 7 Platelet count 152   Per office protocol, patient can hold warfairn for 5 days prior to procedure.   Patient will not need bridging with Lovenox (enoxaparin) around procedure.  Patient was advised that if she develops new symptoms prior to surgery to contact our office to arrange a follow-up appointment.  She verbalized understanding.  I will route this recommendation to the requesting party via Epic fax function and remove from pre-op pool.  Please call with questions.  Jossie Ng. [name] NP-C    08/19/2020, 10:42 AM [place] [address] [place] [address] Office [phone] Fax [phone]

## 2020-08-19 NOTE — Telephone Encounter (Signed)
Pt is returning call in regards to her upcoming surgery scheduled for 08/20/20 to [place]. Please advise pt further  at [phone]

## 2020-08-19 NOTE — Anesthesia Preprocedure Evaluation (Addendum)
Anesthesia Evaluation  Patient identified by MRN, date of birth, ID band Patient awake    Reviewed: Allergy & Precautions, NPO status , Patient's Chart, lab work & pertinent test results  History of Anesthesia Complications Negative for: history of anesthetic complications  Airway Mallampati: II  TM Distance: >3 FB Neck ROM: Full    Dental  (+) Dental Advisory Given   Pulmonary neg pulmonary ROS,  08/16/2020 SARS coronavirus NEG   breath sounds clear to auscultation       Cardiovascular hypertension, Pt. on medications and Pt. on home beta blockers (-) angina+ dysrhythmias Atrial Fibrillation  Rhythm:Irregular Rate:Normal  2021 ECHO: EF 60-65%. The LV has normal function, no regional wall motion abnormalities. There is moderate LVH, Grade I DD, mild-mod MR, mild-mod TR, mild-mod AI   Neuro/Psych  Headaches, Anxiety Depression    GI/Hepatic negative GI ROS, Neg liver ROS,   Endo/Other  negative endocrine ROS  Renal/GU ESRF and DialysisRenal disease (MWF, K+ 4.1)     Musculoskeletal  (+) Arthritis ,   Abdominal   Peds  Hematology Coumadin: last dose Thursday   Anesthesia Other Findings   Reproductive/Obstetrics                           Anesthesia Physical Anesthesia Plan  ASA: 3  Anesthesia Plan: General   Post-op Pain Management:    Induction: Intravenous  PONV Risk Score and Plan: 3 and Ondansetron, Dexamethasone and Treatment may vary due to age or medical condition  Airway Management Planned: Oral ETT  Additional Equipment: None  Intra-op Plan:   Post-operative Plan: Extubation in OR  Informed Consent: I have reviewed the patients History and Physical, chart, labs and discussed the procedure including the risks, benefits and alternatives for the proposed anesthesia with the patient or authorized representative who has indicated his/her understanding and acceptance.      Dental advisory given  Plan Discussed with: CRNA and Surgeon  Anesthesia Plan Comments: (PAT note by Jillian Caldwell, PA-C: Follows with cardiology for history of HTN, paroxysmal A. fib maintained on Coumadin. She was admitted9/23-9/25/2019 for atrial fibrillation, RVR. She spontaneously converted to sinus rhythm and was started on Cardizem and Coumadin.Echo showed mod MR and AI.She is on the transplant list for a kidney. She had a work-up at [facility]. In October 2021 she had a perfusion study which was negative for ischemia. Echo in December 2021 demonstrated well-preserved ejection fraction, mild AR, trace TR.  Cardiac clearance per telephone encounter 08/19/20, "Chart reviewed as part of pre-operative protocol coverage. Given past medical history and time since last visit, based on ACC/AHA guidelines,Jillian S Jesterwould be at acceptable risk for the planned procedure without further cardiovascular testing."  Per pharmacy note 08/15/2020, patient can hold warfarin 5 days prior to procedure and does not need Lovenox bridging.  ESRD on HD Monday Wednesday Friday.  Preop labs reviewed, consistent with ESRD. Otherwise unremarkable.   EKG 04/02/2020: Sinus rhythm with PACs.  Rate 64.  Possible LAE.  LVH.  Nonspecific ST abnormality.  TTE 01/13/2020 (Care Everywhere): SUMMARY  NPS.  Mild left ventricular hypertrophy  The left ventricular size is normal.  LV ejection fraction = 60-65%.  Left ventricular systolic function is normal.  The left atrium is moderately dilated.  The right atrium is mildly dilated.  There is mild aortic regurgitation.  There is mild tricuspid regurgitation.  Estimated right ventricular systolic pressure is 35 mmHg.  IVC size was mildly dilated.  Nuclear stress 11/23/2019 (Care Everywhere): Impression: No inducible ischemia or infarction. Normal left ventricular function.   )     Anesthesia Quick Evaluation

## 2020-08-20 ENCOUNTER — Encounter (HOSPITAL_COMMUNITY): Payer: Self-pay | Admitting: Obstetrics & Gynecology

## 2020-08-20 ENCOUNTER — Encounter (HOSPITAL_COMMUNITY)
Admission: RE | Disposition: A | Discharge status: Home / Self Care | Payer: Self-pay | Source: Home / Self Care | Attending: Obstetrics & Gynecology

## 2020-08-20 ENCOUNTER — Ambulatory Visit (HOSPITAL_COMMUNITY): Payer: Medicare HMO | Admitting: Certified Registered"

## 2020-08-20 ENCOUNTER — Ambulatory Visit (HOSPITAL_COMMUNITY)
Admission: RE | Admit: 2020-08-20 | Discharge: 2020-08-20 | Disposition: A | Discharge status: Home / Self Care | Payer: Medicare HMO | Attending: Obstetrics & Gynecology | Admitting: Obstetrics & Gynecology

## 2020-08-20 ENCOUNTER — Ambulatory Visit (HOSPITAL_COMMUNITY): Payer: Medicare HMO | Admitting: Physician Assistant

## 2020-08-20 ENCOUNTER — Other Ambulatory Visit: Payer: Self-pay

## 2020-08-20 DIAGNOSIS — Z7682 Awaiting organ transplant status: Secondary | ICD-10-CM | POA: Insufficient documentation

## 2020-08-20 DIAGNOSIS — Z992 Dependence on renal dialysis: Secondary | ICD-10-CM | POA: Diagnosis not present

## 2020-08-20 DIAGNOSIS — N83291 Other ovarian cyst, right side: Secondary | ICD-10-CM

## 2020-08-20 DIAGNOSIS — N83201 Unspecified ovarian cyst, right side: Secondary | ICD-10-CM | POA: Diagnosis not present

## 2020-08-20 DIAGNOSIS — C561 Malignant neoplasm of right ovary: Secondary | ICD-10-CM | POA: Diagnosis not present

## 2020-08-20 DIAGNOSIS — I120 Hypertensive chronic kidney disease with stage 5 chronic kidney disease or end stage renal disease: Secondary | ICD-10-CM | POA: Diagnosis not present

## 2020-08-20 DIAGNOSIS — N186 End stage renal disease: Secondary | ICD-10-CM | POA: Insufficient documentation

## 2020-08-20 DIAGNOSIS — N9489 Other specified conditions associated with female genital organs and menstrual cycle: Secondary | ICD-10-CM

## 2020-08-20 DIAGNOSIS — Z7901 Long term (current) use of anticoagulants: Secondary | ICD-10-CM | POA: Insufficient documentation

## 2020-08-20 DIAGNOSIS — N838 Other noninflammatory disorders of ovary, fallopian tube and broad ligament: Secondary | ICD-10-CM | POA: Diagnosis not present

## 2020-08-20 DIAGNOSIS — D631 Anemia in chronic kidney disease: Secondary | ICD-10-CM | POA: Diagnosis not present

## 2020-08-20 DIAGNOSIS — D4959 Neoplasm of unspecified behavior of other genitourinary organ: Secondary | ICD-10-CM | POA: Diagnosis not present

## 2020-08-20 DIAGNOSIS — Z79899 Other long term (current) drug therapy: Secondary | ICD-10-CM | POA: Diagnosis not present

## 2020-08-20 HISTORY — PX: LAPAROSCOPIC BILATERAL SALPINGO OOPHERECTOMY: SHX5890

## 2020-08-20 LAB — POCT I-STAT, CHEM 8
BUN: 43 mg/dL — ABNORMAL HIGH (ref 8–23)
Calcium, Ion: 1.02 mmol/L — ABNORMAL LOW (ref 1.15–1.40)
Chloride: 104 mmol/L (ref 98–111)
Creatinine, Ser: 5 mg/dL — ABNORMAL HIGH (ref 0.44–1.00)
Glucose, Bld: 86 mg/dL (ref 70–99)
HCT: 38 % (ref 36.0–46.0)
Hemoglobin: 12.9 g/dL (ref 12.0–15.0)
Potassium: 4.1 mmol/L (ref 3.5–5.1)
Sodium: 137 mmol/L (ref 135–145)
TCO2: 26 mmol/L (ref 22–32)

## 2020-08-20 LAB — ABO/RH: ABO/RH(D): O POS

## 2020-08-20 LAB — PROTIME-INR
INR: 1.2 (ref 0.8–1.2)
Prothrombin Time: 15 seconds (ref 11.4–15.2)

## 2020-08-20 SURGERY — SALPINGO-OOPHORECTOMY, BILATERAL, LAPAROSCOPIC
Anesthesia: General | Site: Abdomen | Laterality: Bilateral

## 2020-08-20 MED ORDER — CHLORHEXIDINE GLUCONATE 0.12 % MT SOLN: 15.0000 mL | Freq: Once | OROMUCOSAL | Status: AC

## 2020-08-20 MED ORDER — CHLORHEXIDINE GLUCONATE 0.12 % MT SOLN
OROMUCOSAL | Status: AC
  Administered 2020-08-20: 15 mL via OROMUCOSAL
  Filled 2020-08-20: qty 15

## 2020-08-20 MED ORDER — PROMETHAZINE HCL 25 MG/ML IJ SOLN: 6.2500 mg | INTRAMUSCULAR | Status: DC | PRN

## 2020-08-20 MED ORDER — BUPIVACAINE HCL (PF) 0.25 % IJ SOLN
INTRAMUSCULAR | Status: DC | PRN
  Administered 2020-08-20: 10 mL

## 2020-08-20 MED ORDER — PROPOFOL 10 MG/ML IV BOLUS
INTRAVENOUS | Status: DC | PRN
  Administered 2020-08-20: 70 mg via INTRAVENOUS

## 2020-08-20 MED ORDER — PROMETHAZINE HCL 25 MG/ML IJ SOLN
INTRAMUSCULAR | Status: AC
  Administered 2020-08-20: 6.25 mg via INTRAVENOUS
  Filled 2020-08-20: qty 1

## 2020-08-20 MED ORDER — SODIUM CHLORIDE 0.9 % IV SOLN
INTRAVENOUS | Status: DC | PRN
  Administered 2020-08-20: 30 ug/min via INTRAVENOUS

## 2020-08-20 MED ORDER — ORAL CARE MOUTH RINSE: 15.0000 mL | Freq: Once | OROMUCOSAL | Status: AC

## 2020-08-20 MED ORDER — MIDAZOLAM HCL 2 MG/2ML IJ SOLN: 0.5000 mg | Freq: Once | INTRAMUSCULAR | Status: DC | PRN

## 2020-08-20 MED ORDER — SUGAMMADEX SODIUM 200 MG/2ML IV SOLN
INTRAVENOUS | Status: DC | PRN
  Administered 2020-08-20: 200 mg via INTRAVENOUS

## 2020-08-20 MED ORDER — ROCURONIUM BROMIDE 10 MG/ML (PF) SYRINGE
PREFILLED_SYRINGE | INTRAVENOUS | Status: DC | PRN
  Administered 2020-08-20: 50 mg via INTRAVENOUS

## 2020-08-20 MED ORDER — HYDROMORPHONE HCL 1 MG/ML IJ SOLN: 0.2500 mg | INTRAMUSCULAR | Status: DC | PRN

## 2020-08-20 MED ORDER — HYDROCODONE-ACETAMINOPHEN 5-325 MG PO TABS: 1.0000 | ORAL_TABLET | Freq: Four times a day (QID) | ORAL | 0 refills | Status: DC | PRN

## 2020-08-20 MED ORDER — EPHEDRINE SULFATE 50 MG/ML IJ SOLN
INTRAMUSCULAR | Status: DC | PRN
  Administered 2020-08-20 (×2): 5 mg via INTRAVENOUS

## 2020-08-20 MED ORDER — SODIUM CHLORIDE 0.9 % IR SOLN
Status: DC | PRN
  Administered 2020-08-20: 1000 mL

## 2020-08-20 MED ORDER — 0.9 % SODIUM CHLORIDE (POUR BTL) OPTIME
TOPICAL | Status: DC | PRN
  Administered 2020-08-20: 1000 mL

## 2020-08-20 MED ORDER — WARFARIN SODIUM 5 MG PO TABS
5.0000 mg | ORAL_TABLET | Freq: Every day | ORAL | 0 refills | Status: DC
Start: 2020-08-20 — End: 2022-08-07

## 2020-08-20 MED ORDER — ONDANSETRON HCL 4 MG/2ML IJ SOLN
INTRAMUSCULAR | Status: DC | PRN
  Administered 2020-08-20: 4 mg via INTRAVENOUS

## 2020-08-20 MED ORDER — POVIDONE-IODINE 10 % EX SWAB
2.0000 "application " | Freq: Once | CUTANEOUS | Status: AC
  Administered 2020-08-20: 2 via TOPICAL

## 2020-08-20 MED ORDER — FENTANYL CITRATE (PF) 100 MCG/2ML IJ SOLN
INTRAMUSCULAR | Status: AC
  Filled 2020-08-20: qty 2

## 2020-08-20 MED ORDER — DEXAMETHASONE SODIUM PHOSPHATE 4 MG/ML IJ SOLN
INTRAMUSCULAR | Status: DC | PRN
  Administered 2020-08-20: 4 mg via INTRAVENOUS

## 2020-08-20 MED ORDER — SODIUM CHLORIDE 0.9 % IV SOLN: INTRAVENOUS | Status: DC

## 2020-08-20 MED ORDER — LIDOCAINE 2% (20 MG/ML) 5 ML SYRINGE
INTRAMUSCULAR | Status: DC | PRN
Start: 2020-08-20 — End: 2020-08-20
  Administered 2020-08-20: 40 mg via INTRAVENOUS

## 2020-08-20 MED ORDER — OXYCODONE HCL 5 MG/5ML PO SOLN
5.0000 mg | Freq: Once | ORAL | Status: DC | PRN
Start: 2020-08-20 — End: 2020-08-20

## 2020-08-20 MED ORDER — BUPIVACAINE HCL (PF) 0.25 % IJ SOLN
INTRAMUSCULAR | Status: AC
  Filled 2020-08-20: qty 30

## 2020-08-20 MED ORDER — OXYCODONE HCL 5 MG PO TABS: 5.0000 mg | ORAL_TABLET | Freq: Once | ORAL | Status: DC | PRN

## 2020-08-20 MED ORDER — FENTANYL CITRATE (PF) 100 MCG/2ML IJ SOLN
INTRAMUSCULAR | Status: DC | PRN
  Administered 2020-08-20: 50 ug via INTRAVENOUS

## 2020-08-20 SURGICAL SUPPLY — 42 items
ADH SKN CLS APL DERMABOND .7 (GAUZE/BANDAGES/DRESSINGS) ×1
APL SWBSTK 6 STRL LF DISP (MISCELLANEOUS) ×1
APPLICATOR COTTON TIP 6 STRL (MISCELLANEOUS) IMPLANT
APPLICATOR COTTON TIP 6IN STRL (MISCELLANEOUS) ×2 IMPLANT
BAG COUNTER SPONGE SURGICOUNT (BAG) ×2 IMPLANT
BAG SPEC RTRVL LRG 6X4 10 (ENDOMECHANICALS) ×1
BAG SPNG CNTER NS LX DISP (BAG) ×1
CABLE HIGH FREQUENCY MONO STRZ (ELECTRODE) IMPLANT
DERMABOND ADVANCED (GAUZE/BANDAGES/DRESSINGS) ×1
DERMABOND ADVANCED .7 DNX12 (GAUZE/BANDAGES/DRESSINGS) ×1 IMPLANT
DURAPREP 26ML APPLICATOR (WOUND CARE) ×2 IMPLANT
GLOVE SURG LTX SZ6.5 (GLOVE) ×2 IMPLANT
GLOVE SURG UNDER POLY LF SZ7 (GLOVE) ×8 IMPLANT
GOWN STRL REUS W/ TWL LRG LVL3 (GOWN DISPOSABLE) ×3 IMPLANT
GOWN STRL REUS W/TWL LRG LVL3 (GOWN DISPOSABLE) ×6
IRRIG SUCT STRYKERFLOW 2 WTIP (MISCELLANEOUS) ×2
IRRIGATION SUCT STRKRFLW 2 WTP (MISCELLANEOUS) IMPLANT
KIT TURNOVER KIT B (KITS) ×2 IMPLANT
LIGASURE VESSEL 5MM BLUNT TIP (ELECTROSURGICAL) ×1 IMPLANT
NDL INSUFFLATION 14GA 120MM (NEEDLE) ×1 IMPLANT
NEEDLE INSUFFLATION 14GA 120MM (NEEDLE) ×2 IMPLANT
PACK LAPAROSCOPY BASIN (CUSTOM PROCEDURE TRAY) ×2 IMPLANT
PACK TRENDGUARD 450 HYBRID PRO (MISCELLANEOUS) IMPLANT
PACK TRENDGUARD 600 HYBRD PROC (MISCELLANEOUS) IMPLANT
POUCH LAPAROSCOPIC INSTRUMENT (MISCELLANEOUS) ×2 IMPLANT
POUCH SPECIMEN RETRIEVAL 10MM (ENDOMECHANICALS) ×1 IMPLANT
PROTECTOR NERVE ULNAR (MISCELLANEOUS) ×4 IMPLANT
SET IRRIG TUBING LAPAROSCOPIC (IRRIGATION / IRRIGATOR) IMPLANT
SHEARS HARMONIC ACE PLUS 36CM (ENDOMECHANICALS) IMPLANT
SLEEVE ADV FIXATION 5X100MM (TROCAR) IMPLANT
SUT VICRYL 0 UR6 27IN ABS (SUTURE) IMPLANT
SUT VICRYL 4-0 PS2 18IN ABS (SUTURE) ×2 IMPLANT
SYR 30ML LL (SYRINGE) IMPLANT
SYSTEM CARTER THOMASON II (TROCAR) ×1 IMPLANT
TOWEL GREEN STERILE FF (TOWEL DISPOSABLE) ×4 IMPLANT
TRAY FOLEY W/BAG SLVR 14FR (SET/KITS/TRAYS/PACK) ×2 IMPLANT
TRENDGUARD 450 HYBRID PRO PACK (MISCELLANEOUS) ×2
TRENDGUARD 600 HYBRID PROC PK (MISCELLANEOUS)
TROCAR ADV FIXATION 5X100MM (TROCAR) ×2 IMPLANT
TROCAR XCEL NON-BLD 11X100MML (ENDOMECHANICALS) IMPLANT
TROCAR XCEL NON-BLD 5MMX100MML (ENDOMECHANICALS) ×4 IMPLANT
WARMER LAPAROSCOPE (MISCELLANEOUS) ×2 IMPLANT

## 2020-08-20 NOTE — Transfer of Care (Signed)
Immediate Anesthesia Transfer of Care Note  Patient: Jillian Hayes  Procedure(s) Performed: LAPAROSCOPIC BILATERAL SALPINGO OOPHORECTOMY (Bilateral: Abdomen)  Patient Location: PACU  Anesthesia Type:General  Level of Consciousness: awake, oriented, drowsy and patient cooperative  Airway & Oxygen Therapy: Patient Spontanous Breathing and Patient connected to face mask oxygen  Post-op Assessment: Report given to RN, Post -op Vital signs reviewed and stable and Patient moving all extremities X 4  Post vital signs: Reviewed and stable  Last Vitals:  Vitals Value Taken Time  BP 124/46 08/20/20 1022  Temp    Pulse 51 08/20/20 1025  Resp 11 08/20/20 1025  SpO2 99 % 08/20/20 1025  Vitals shown include unvalidated device data.  Last Pain:  Vitals:   08/20/20 0733  TempSrc:   PainSc: 0-No pain      Patients Stated Pain Goal: 3 (17/40/81 4481)  Complications: No notable events documented.

## 2020-08-20 NOTE — Anesthesia Procedure Notes (Signed)
Procedure Name: Intubation Date/Time: 08/20/2020 9:14 AM Performed by: Annamary Carolin, CRNA Pre-anesthesia Checklist: Patient identified, Emergency Drugs available, Suction available and Patient being monitored Patient Re-evaluated:Patient Re-evaluated prior to induction Oxygen Delivery Method: Circle System Utilized Preoxygenation: Pre-oxygenation with 100% oxygen Induction Type: IV induction Ventilation: Mask ventilation without difficulty Laryngoscope Size: Mac and 3 Grade View: Grade I Tube type: Oral Number of attempts: 1 Airway Equipment and Method: Stylet and Oral airway Placement Confirmation: ETT inserted through vocal cords under direct vision, positive ETCO2 and breath sounds checked- equal and bilateral Tube secured with: Tape Dental Injury: Teeth and Oropharynx as per pre-operative assessment

## 2020-08-20 NOTE — Discharge Instructions (Addendum)
Post-surgical Instructions, Outpatient Surgery  You may expect to feel dizzy, weak, and drowsy for as long as 24 hours after receiving the medicine that made you sleep (anesthetic). For the first 24 hours after your surgery:   Do not drive a car, ride a bicycle, participate in physical activities, or take public transportation until you are done taking narcotic pain medicines or as directed by Dr. Sabra Heck.  Do not drink alcohol or take tranquilizers.  Do not take medicine that has not been prescribed by your physicians.  Do not sign important papers or make important decisions while on narcotic pain medicines.  Have a responsible person with you.   CARE OF INCISION If you have a bandage, you may remove it in one day.  If there are steri-strips or dermabond, just let this loosen on its own.  You may shower on the first day after your surgery.  Do not sit in a tub bath for one week. Avoid heavy lifting (more than 10 pounds/4.5 kilograms), pushing, or pulling.  Avoid activities that may risk injury to your incisions.   PAIN MANAGEMENT It is fine to take Tylenol for your post op pain.  You can take up to 2 extra strength Tylenol ever 6 hours.  If you need more than this for pain control, you can take a 5/325mg  hydrocodone/tylenol tablet.  Do not take tylenol in addition to this if you need the stronger pain medication as it does contain Tylenol in it.  You can take 1/2 tablet of the pain medication if you think this will be enough as well.  DO'S AND DON'T'S Do not take a tub bath for one week.  You may shower on the first day after your surgery Do not do any heavy lifting for one to two weeks.  This increases the chance of bleeding. Do move around as you feel able.  Stairs are fine.  You may begin to exercise again as you feel able.  Do not lift any weights for two weeks. Do not put anything in the vagina for two weeks--no tampons, intercourse, or douching.    REGULAR MEDIATIONS/VITAMINS: You may  restart all of your regular medications as prescribed. You may restart all of your vitamins as you normally take them.    PLEASE CALL OR SEEK MEDICAL CARE IF: You have persistent nausea and vomiting.  You have trouble eating or drinking.  You have an oral temperature above 100.5.  You have constipation that is not helped by adjusting diet or increasing fluid intake. Pain medicines are a common cause of constipation.  You have heavy vaginal bleeding You have redness or drainage from your incision(s) or there is increasing pain or tenderness near or in the surgical site.

## 2020-08-20 NOTE — Op Note (Signed)
08/20/2020  10:13 AM  PATIENT:  Jillian Hayes  75 y.o. female  PRE-OPERATIVE DIAGNOSIS:  5CM RIGHT OVARIAN NEOPLASM  POST-OPERATIVE DIAGNOSIS:  5CM RIGHT OVARIAN NEOPLASM  PROCEDURE:  Procedure(s): LAPAROSCOPIC RIGHT SALPINGOOPHORECTOMY  SURGEON:  Jillian Hayes  ASSISTANTS: Dr. Arlina Robes.  An experienced assistant was required given the standard of surgical care given the complexity of the case.  This assistant was needed for exposure, dissection, suctioning, retraction, instrument exchange and for overall help during the procedure.  RNFA help was also unavailable.  ANESTHESIA:   general  ESTIMATED BLOOD LOSS: 10 mL  BLOOD ADMINISTERED:none   FLUIDS: 250cc LR  UOP: 50cc clear UOPD  SPECIMEN:  right tube and ovary, cytologic washing  DISPOSITION OF SPECIMEN:  PATHOLOGY  FINDINGS: adhesions of colon on right sidewall, normal appendix, non decompressed gallbladder, normal liver ands stomach edge, enlarged right cyst that was coming from the tube and not ovarian, left ovary adhered to left colon  DESCRIPTION OF OPERATION: Patient is taken to the operating room. She is placed in the supine position. She is a running IV in place. Informed consent was present on the chart. SCDs on her lower extremities and functioning properly. Patient was positioned while she was awake.  Her legs were placed in the low lithotomy position in [place]. Her arms were tucked by the side.  General endotracheal anesthesia was administered by the anesthesia staff without difficulty. Dr. Jenita Seashore, anesthesia, oversaw case.  Time out performed.    Dura prep was then used to prep the abdomen and Betadine was used to prep the inner thighs, perineum and vagina. Once 3 minutes had past the patient was draped in a normal standard fashion. The legs were lifted to the high lithotomy position. The cervix was visualized by placing a bivalved speculum in the vagina. The anterior lip of the cervix was grasped  with single-tooth tenaculum.  Milex dilator was used to dilate the cervix.  Hulka clamp was passed through the cervical os and attached to the anterior lip of the cervix.  Good manipulation of the uterus was noted.  The speculum was removed.  A Foley catheter was placed to straight drain.  Clear urine was noted. Legs were lowered to the low lithotomy position and attention was turned the abdomen.  The umbilicus was everted.  A Veress needle was obtained. Syringe of sterile saline was placed on a open Veress needle.  This was passed into the umbilicus until just when the fluid started to drip.  Then low flow CO2 gas was attached the needle and the pneumoperitoneum was achieved without difficulty. Once four liters of gas was in the abdomen the Veress needle was removed and a 5 millimeter non-bladed Optiview trocar and port were passed directly to the abdomen. The laparoscope was then used to confirm intraperitoneal placement. Findings noted above.  Locations for RLQ and LLQ ports were noted by transillumination of the abdominal wall.  0.25% marcaine was used to anesthetize the skin.  9mm skin incision was made in the RLQ and a 34mm nonbladed trochar was placed.  In the LLQ, a 80mm skin incision was made after placing 0.25% marcaine and a 10-11 mm non-bladed port was placed with direct visualization of the laparoscope.  All trochars were removed.    Cytologic washings were obtained.  Ureters was identified on the right.  The uterus was placed on stretch to the opposite side.  The right IP ligament was serially clamped, cauterized and incised.  The  the tube, close to the uterus, was clamped, cauterized and incised. The mesosalpinx was incised freeing the tube and ovary.    Then the left side was inspected.  The left tube was noted and ovary was seen but these were adhered to the left colon.  Pt and I had discussed prior to surgery if this was the case, then I would leave the left ovary and tube.  At this point,  decision was made to not removed the left tube and ovary as pt is at the top of the renal transplant list and has to turn down two kidney transplants waiting for this procedure.  Endoscopic bag was placed and the right tube and ovary was placed in the bag.  This was brought through the LLQ incision after port was removed.  Cyst was ruptured in the bag and then specimen removed.  No intraperitoneal spill occurred.    At this point, the procedure was ended.  The pelvic was irrigated again and the CO2 gas pressures were decreased to 62mm Hg.  Hemostasis was present.  All pedicles were inspected.  LLQ incision closed at the fascial layer with #0 Vicryl.  The remaining instruments were removed.  The  RLQ port was removed.  The patient was taken out of Trendelenburg positioning.  Several deep breaths were given to the patient's trying to any gas the abdomen and finally the midline port was removed.  The skin was then closed with subcuticular stitches of 3-0 Vicryl. The skin was cleansed Dermabond was applied. Attention was then turned the vagina and the instrument removed. No bleeding was noted. Foley was left out after the cystoscopic fluid was drained and cystoscope removed.  Sponge, lap, needle, initially counts were correct x2. Patient tolerated the procedure very well. She was awakened from anesthesia, extubated and taken to recovery in stable condition.   COUNTS:  YES  PLAN OF CARE: Transfer to PACU

## 2020-08-20 NOTE — Anesthesia Postprocedure Evaluation (Addendum)
Anesthesia Post Note  Patient: Jillian Hayes  Procedure(s) Performed: LAPAROSCOPIC BILATERAL SALPINGO OOPHORECTOMY (Bilateral: Abdomen)     Patient location during evaluation: PACU Anesthesia Type: General Level of consciousness: patient cooperative, oriented and awake and alert Pain management: pain level controlled Vital Signs Assessment: post-procedure vital signs reviewed and stable Respiratory status: spontaneous breathing, nonlabored ventilation and respiratory function stable Cardiovascular status: blood pressure returned to baseline and stable Postop Assessment: no apparent nausea or vomiting (nausea relieved) Anesthetic complications: no   No notable events documented.  Last Vitals:  Vitals:   08/20/20 1107 08/20/20 1122  BP: (!) 125/57 (!) 123/51  Pulse: (!) 50 (!) 48  Resp: 13 16  Temp:    SpO2: 96% 97%    Last Pain:  Vitals:   08/20/20 1122  TempSrc:   PainSc: Asleep                 Veniamin Kincaid,E. Doron Shake

## 2020-08-20 NOTE — H&P (Signed)
Jillian Hayes is an 75 y.o. female G2P2 here for laparoscopic BSO  due to enlarging right simple ovarian cyst.  Last ultrasound was 04/04/2020 with right ovarian cyst measuring 5.2cm.  Due to size, removal recommended.  Pt is on renal transplant list to I do not feel it is appropriate to monitor this any longer.  Ca-125 12/07/2019 was normal at 22.3.  Risks, benefits have been discussed and documented in prior office note.  Pertinent Gynecological History: Menses: post-menopausal Contraception: post menopausal status DES exposure: denies Blood transfusions: none Sexually transmitted diseases: no past history Previous GYN Procedures:  none   Last mammogram: normal Date: 01/2020 Last pap: normal Date: 08/2019 OB History: G2, P2   Menstrual History: No LMP recorded. Patient is postmenopausal.    Past Medical History:  Diagnosis Date   Advanced care planning/counseling discussion 06/10/2014   05/31/2014 patient presents copy of HCP and Living Will   Anemia    iron deficiency   Anxiety    Arthritis    "in hands"   Atrial fibrillation ([place])    Benign fundic gland polyps of stomach    Bladder polyps 06/25/2010   Chronic headaches 62/69/4854   Complication of anesthesia    "woke up at the end of a cyst removal surgery in 1991"   Depression 1991   hospitalized   ESRD (end stage renal disease) ([place]) 11/09/2015   HD on MWF   History of chicken pox 06/25/2010   Hypercalcemia 02/18/2014   Hypertension    Insomnia 06/24/2010   Multiple chemical sensitivity syndrome 06/25/2010   Proteinuria 02/18/2014   Renal insufficiency 03/26/2011   Valvular heart disease 04/28/2016    Past Surgical History:  Procedure Laterality Date   AV FISTULA PLACEMENT Left    x4   COLONOSCOPY  2014   cyst on left breast removed Left 1991   benign   ESOPHAGOGASTRODUODENOSCOPY (EGD) WITH ESOPHAGEAL DILATION  2014   NASAL SEPTUM SURGERY  1986   rhinoplasty   polyps on bladder removed  1972   benign    TONSILLECTOMY  1962    Family History  Problem Relation Age of Onset   Breast cancer Mother 71       left breast removed, 2010 lung   Diabetes Mother    Nephrolithiasis Father    Heart attack Father 62       X 3   Heart disease Father        smoker   Hypertension Brother    Allergic rhinitis Brother    Melanoma Son 62       melanoma on leg removed   Pancreatic cancer Maternal Grandmother    Hypertension Paternal Grandmother    Obesity Paternal Grandmother    Heart attack Paternal Grandfather 56   Diabetes Maternal Aunt        x 2   Diabetes Maternal Uncle        x 3   Asthma Neg Hx    Eczema Neg Hx    Urticaria Neg Hx    Immunodeficiency Neg Hx    Angioedema Neg Hx     Social History:  reports that she has never smoked. She has never used smokeless tobacco. She reports that she does not drink alcohol and does not use drugs.  Allergies:  Allergies  Allergen Reactions   Sulfa Antibiotics Nausea And Vomiting   Clonidine Derivatives Palpitations    Medications Prior to Admission  Medication Sig Dispense Refill Last Dose   acetaminophen (TYLENOL)  500 MG tablet Take 500 mg by mouth every 6 (six) hours as needed for headache (pain).   08/19/2020   Alfalfa 500 MG TABS Take 500 mg by mouth 3 (three) times daily.   Past Week   atorvastatin (LIPITOR) 20 MG tablet Take 20 mg by mouth at bedtime.   08/19/2020   carvedilol (COREG) 25 MG tablet Take 1 tablet by mouth twice daily (Patient taking differently: Take 25 mg by mouth 2 (two) times daily with a meal.) 180 tablet 3 08/20/2020 at 0600   cinacalcet (SENSIPAR) 30 MG tablet Take 30 mg by mouth at bedtime.   08/19/2020   diltiazem (CARDIZEM CD) 240 MG 24 hr capsule Take 240 mg by mouth daily at 12 noon.   08/20/2020 at 0600   Flaxseed, Linseed, (FLAX SEED OIL) 1000 MG CAPS Take 1,000 mg by mouth daily.   Past Week   hydrALAZINE (APRESOLINE) 50 MG tablet Take 50 mg by mouth 2 (two) times daily.   08/20/2020 at 0600   L-Glutamine 500  MG CAPS Take 500 mg by mouth daily after supper.   Past Week   L-Tryptophan 500 MG CAPS Take 500 mg by mouth at bedtime.   Past Week   lidocaine-prilocaine (EMLA) cream Apply 1 application topically as needed [facility]).      multivitamin (RENA-VIT) TABS tablet Take 1 tablet by mouth daily.   Past Week   OVER THE COUNTER MEDICATION Take 1 capsule by mouth daily. Herblax   Past Week   Probiotic Product (PROBIOTIC DAILY PO) Take 1 tablet by mouth daily with lunch.    Past Week   sevelamer carbonate (RENVELA) 800 MG tablet Take 2,400 mg by mouth 3 (three) times daily.   08/19/2020   vitamin C (ASCORBIC ACID) 500 MG tablet Take 500 mg by mouth daily.   Past Week   Vitamin E 400 units TABS Take 400 Units by mouth daily with lunch.   Past Week   warfarin (COUMADIN) 5 MG tablet TAKE 1 & 1/2 (ONE & ONE-HALF) TABLETS BY MOUTH ONCE DAILY AS DIRECTED (Patient taking differently: Take 5 mg by mouth daily at 4 PM. ONCE DAILY AS DIRECTED) 45 tablet 0 Past Week   amitriptyline (ELAVIL) 10 MG tablet Take 0.5-2 tablets (5-20 mg total) by mouth at bedtime. (Patient not taking: Reported on 08/15/2020) 60 tablet 1 Not Taking   raloxifene (EVISTA) 60 MG tablet Take 1 tablet (60 mg total) by mouth daily. (Patient not taking: Reported on 08/15/2020) 30 tablet 5 Not Taking    Review of Systems  All other systems reviewed and are negative.  Blood pressure (!) 143/50, pulse 64, temperature 97.8 F (36.6 C), temperature source Oral, resp. rate 17, height 5\' 5"  (1.651 m), weight 60.3 kg, SpO2 96 %. Physical Exam Constitutional:      Appearance: Normal appearance.  Cardiovascular:     Rate and Rhythm: Normal rate and regular rhythm.  Pulmonary:     Effort: Pulmonary effort is normal.     Breath sounds: Normal breath sounds.  Neurological:     General: No focal deficit present.     Mental Status: She is alert.  Psychiatric:        Mood and Affect: Mood normal.    Results for orders placed or performed during the  hospital encounter of 08/20/20 (from the past 24 hour(s))  PT- INR Day of Surgery per protocol     Status: None   Collection Time: 08/20/20  7:10 AM  Result Value  Ref Range   Prothrombin Time 15.0 11.4 - 15.2 seconds   INR 1.2 0.8 - 1.2  ABO/Rh     Status: None   Collection Time: 08/20/20  7:30 AM  Result Value Ref Range   ABO/RH(D)      O POS Performed at [facility] [address] [place], [place] [address]   I-STAT, [place] 8     Status: Abnormal   Collection Time: 08/20/20  7:33 AM  Result Value Ref Range   Sodium 137 135 - 145 mmol/L   Potassium 4.1 3.5 - 5.1 mmol/L   Chloride 104 98 - 111 mmol/L   BUN 43 (H) 8 - 23 mg/dL   Creatinine, Ser 5.00 (H) 0.44 - 1.00 mg/dL   Glucose, Bld 86 70 - 99 mg/dL   Calcium, Ion 1.02 (L) 1.15 - 1.40 mmol/L   TCO2 26 22 - 32 mmol/L   Hemoglobin 12.9 12.0 - 15.0 g/dL   HCT 38.0 36.0 - 46.0 %    No results found.  Assessment/Plan: 47 you G2P2 MWF with enlarging right ovarian cyst here for laparoscopic BSO.  Pt's questions answered and she is ready to proceed.  Megan Salon 08/20/2020, 8:51 AM

## 2020-08-21 ENCOUNTER — Encounter (HOSPITAL_COMMUNITY): Payer: Self-pay | Admitting: Obstetrics & Gynecology

## 2020-08-21 DIAGNOSIS — D631 Anemia in chronic kidney disease: Secondary | ICD-10-CM | POA: Diagnosis not present

## 2020-08-21 DIAGNOSIS — N186 End stage renal disease: Secondary | ICD-10-CM | POA: Diagnosis not present

## 2020-08-21 DIAGNOSIS — D509 Iron deficiency anemia, unspecified: Secondary | ICD-10-CM | POA: Diagnosis not present

## 2020-08-21 DIAGNOSIS — N2581 Secondary hyperparathyroidism of renal origin: Secondary | ICD-10-CM | POA: Diagnosis not present

## 2020-08-21 LAB — CYTOLOGY - NON PAP

## 2020-08-22 ENCOUNTER — Other Ambulatory Visit (HOSPITAL_COMMUNITY): Payer: Medicare HMO

## 2020-08-22 LAB — SURGICAL PATHOLOGY

## 2020-08-23 DIAGNOSIS — N2581 Secondary hyperparathyroidism of renal origin: Secondary | ICD-10-CM | POA: Diagnosis not present

## 2020-08-23 DIAGNOSIS — D509 Iron deficiency anemia, unspecified: Secondary | ICD-10-CM | POA: Diagnosis not present

## 2020-08-23 DIAGNOSIS — D631 Anemia in chronic kidney disease: Secondary | ICD-10-CM | POA: Diagnosis not present

## 2020-08-23 DIAGNOSIS — N186 End stage renal disease: Secondary | ICD-10-CM | POA: Diagnosis not present

## 2020-08-26 DIAGNOSIS — N186 End stage renal disease: Secondary | ICD-10-CM | POA: Diagnosis not present

## 2020-08-26 DIAGNOSIS — N2581 Secondary hyperparathyroidism of renal origin: Secondary | ICD-10-CM | POA: Diagnosis not present

## 2020-08-26 DIAGNOSIS — D509 Iron deficiency anemia, unspecified: Secondary | ICD-10-CM | POA: Diagnosis not present

## 2020-08-26 DIAGNOSIS — D631 Anemia in chronic kidney disease: Secondary | ICD-10-CM | POA: Diagnosis not present

## 2020-08-26 DIAGNOSIS — Z7901 Long term (current) use of anticoagulants: Secondary | ICD-10-CM | POA: Diagnosis not present

## 2020-08-28 DIAGNOSIS — D509 Iron deficiency anemia, unspecified: Secondary | ICD-10-CM | POA: Diagnosis not present

## 2020-08-28 DIAGNOSIS — N2581 Secondary hyperparathyroidism of renal origin: Secondary | ICD-10-CM | POA: Diagnosis not present

## 2020-08-28 DIAGNOSIS — N186 End stage renal disease: Secondary | ICD-10-CM | POA: Diagnosis not present

## 2020-08-28 DIAGNOSIS — D631 Anemia in chronic kidney disease: Secondary | ICD-10-CM | POA: Diagnosis not present

## 2020-08-30 DIAGNOSIS — D509 Iron deficiency anemia, unspecified: Secondary | ICD-10-CM | POA: Diagnosis not present

## 2020-08-30 DIAGNOSIS — D631 Anemia in chronic kidney disease: Secondary | ICD-10-CM | POA: Diagnosis not present

## 2020-08-30 DIAGNOSIS — N186 End stage renal disease: Secondary | ICD-10-CM | POA: Diagnosis not present

## 2020-08-30 DIAGNOSIS — N2581 Secondary hyperparathyroidism of renal origin: Secondary | ICD-10-CM | POA: Diagnosis not present

## 2020-09-01 DIAGNOSIS — N186 End stage renal disease: Secondary | ICD-10-CM | POA: Diagnosis not present

## 2020-09-01 DIAGNOSIS — Z992 Dependence on renal dialysis: Secondary | ICD-10-CM | POA: Diagnosis not present

## 2020-09-02 DIAGNOSIS — N186 End stage renal disease: Secondary | ICD-10-CM | POA: Diagnosis not present

## 2020-09-02 DIAGNOSIS — N2581 Secondary hyperparathyroidism of renal origin: Secondary | ICD-10-CM | POA: Diagnosis not present

## 2020-09-02 DIAGNOSIS — Z7901 Long term (current) use of anticoagulants: Secondary | ICD-10-CM | POA: Diagnosis not present

## 2020-09-02 DIAGNOSIS — D631 Anemia in chronic kidney disease: Secondary | ICD-10-CM | POA: Diagnosis not present

## 2020-09-06 DIAGNOSIS — N186 End stage renal disease: Secondary | ICD-10-CM | POA: Diagnosis not present

## 2020-09-06 DIAGNOSIS — D631 Anemia in chronic kidney disease: Secondary | ICD-10-CM | POA: Diagnosis not present

## 2020-09-06 DIAGNOSIS — N2581 Secondary hyperparathyroidism of renal origin: Secondary | ICD-10-CM | POA: Diagnosis not present

## 2020-09-09 DIAGNOSIS — N186 End stage renal disease: Secondary | ICD-10-CM | POA: Diagnosis not present

## 2020-09-09 DIAGNOSIS — N2581 Secondary hyperparathyroidism of renal origin: Secondary | ICD-10-CM | POA: Diagnosis not present

## 2020-09-09 DIAGNOSIS — D631 Anemia in chronic kidney disease: Secondary | ICD-10-CM | POA: Diagnosis not present

## 2020-09-11 DIAGNOSIS — D631 Anemia in chronic kidney disease: Secondary | ICD-10-CM | POA: Diagnosis not present

## 2020-09-11 DIAGNOSIS — N2581 Secondary hyperparathyroidism of renal origin: Secondary | ICD-10-CM | POA: Diagnosis not present

## 2020-09-11 DIAGNOSIS — N186 End stage renal disease: Secondary | ICD-10-CM | POA: Diagnosis not present

## 2020-09-11 DIAGNOSIS — Z7901 Long term (current) use of anticoagulants: Secondary | ICD-10-CM | POA: Diagnosis not present

## 2020-09-13 DIAGNOSIS — N186 End stage renal disease: Secondary | ICD-10-CM | POA: Diagnosis not present

## 2020-09-13 DIAGNOSIS — N2581 Secondary hyperparathyroidism of renal origin: Secondary | ICD-10-CM | POA: Diagnosis not present

## 2020-09-13 DIAGNOSIS — D631 Anemia in chronic kidney disease: Secondary | ICD-10-CM | POA: Diagnosis not present

## 2020-09-16 DIAGNOSIS — Z7901 Long term (current) use of anticoagulants: Secondary | ICD-10-CM | POA: Diagnosis not present

## 2020-09-16 DIAGNOSIS — N186 End stage renal disease: Secondary | ICD-10-CM | POA: Diagnosis not present

## 2020-09-16 DIAGNOSIS — D631 Anemia in chronic kidney disease: Secondary | ICD-10-CM | POA: Diagnosis not present

## 2020-09-16 DIAGNOSIS — N2581 Secondary hyperparathyroidism of renal origin: Secondary | ICD-10-CM | POA: Diagnosis not present

## 2020-09-18 DIAGNOSIS — D631 Anemia in chronic kidney disease: Secondary | ICD-10-CM | POA: Diagnosis not present

## 2020-09-18 DIAGNOSIS — N2581 Secondary hyperparathyroidism of renal origin: Secondary | ICD-10-CM | POA: Diagnosis not present

## 2020-09-18 DIAGNOSIS — N186 End stage renal disease: Secondary | ICD-10-CM | POA: Diagnosis not present

## 2020-09-20 ENCOUNTER — Telehealth: Payer: Self-pay | Admitting: Family Medicine

## 2020-09-20 ENCOUNTER — Other Ambulatory Visit: Payer: Self-pay | Admitting: Family Medicine

## 2020-09-20 DIAGNOSIS — N186 End stage renal disease: Secondary | ICD-10-CM | POA: Diagnosis not present

## 2020-09-20 DIAGNOSIS — H9190 Unspecified hearing loss, unspecified ear: Secondary | ICD-10-CM

## 2020-09-20 DIAGNOSIS — N2581 Secondary hyperparathyroidism of renal origin: Secondary | ICD-10-CM | POA: Diagnosis not present

## 2020-09-20 DIAGNOSIS — D631 Anemia in chronic kidney disease: Secondary | ICD-10-CM | POA: Diagnosis not present

## 2020-09-20 NOTE — Telephone Encounter (Signed)
Patient is requesting a referral to [facility] for a hearing Evaluation

## 2020-09-23 DIAGNOSIS — D631 Anemia in chronic kidney disease: Secondary | ICD-10-CM | POA: Diagnosis not present

## 2020-09-23 DIAGNOSIS — N186 End stage renal disease: Secondary | ICD-10-CM | POA: Diagnosis not present

## 2020-09-23 DIAGNOSIS — Z7901 Long term (current) use of anticoagulants: Secondary | ICD-10-CM | POA: Diagnosis not present

## 2020-09-23 DIAGNOSIS — N2581 Secondary hyperparathyroidism of renal origin: Secondary | ICD-10-CM | POA: Diagnosis not present

## 2020-09-25 DIAGNOSIS — N2581 Secondary hyperparathyroidism of renal origin: Secondary | ICD-10-CM | POA: Diagnosis not present

## 2020-09-25 DIAGNOSIS — D631 Anemia in chronic kidney disease: Secondary | ICD-10-CM | POA: Diagnosis not present

## 2020-09-25 DIAGNOSIS — N186 End stage renal disease: Secondary | ICD-10-CM | POA: Diagnosis not present

## 2020-09-27 DIAGNOSIS — N2581 Secondary hyperparathyroidism of renal origin: Secondary | ICD-10-CM | POA: Diagnosis not present

## 2020-09-27 DIAGNOSIS — D631 Anemia in chronic kidney disease: Secondary | ICD-10-CM | POA: Diagnosis not present

## 2020-09-27 DIAGNOSIS — N186 End stage renal disease: Secondary | ICD-10-CM | POA: Diagnosis not present

## 2020-09-30 DIAGNOSIS — N2581 Secondary hyperparathyroidism of renal origin: Secondary | ICD-10-CM | POA: Diagnosis not present

## 2020-09-30 DIAGNOSIS — Z7901 Long term (current) use of anticoagulants: Secondary | ICD-10-CM | POA: Diagnosis not present

## 2020-09-30 DIAGNOSIS — D631 Anemia in chronic kidney disease: Secondary | ICD-10-CM | POA: Diagnosis not present

## 2020-09-30 DIAGNOSIS — N186 End stage renal disease: Secondary | ICD-10-CM | POA: Diagnosis not present

## 2020-10-02 DIAGNOSIS — N186 End stage renal disease: Secondary | ICD-10-CM | POA: Diagnosis not present

## 2020-10-02 DIAGNOSIS — Z992 Dependence on renal dialysis: Secondary | ICD-10-CM | POA: Diagnosis not present

## 2020-10-02 DIAGNOSIS — N2581 Secondary hyperparathyroidism of renal origin: Secondary | ICD-10-CM | POA: Diagnosis not present

## 2020-10-02 DIAGNOSIS — D631 Anemia in chronic kidney disease: Secondary | ICD-10-CM | POA: Diagnosis not present

## 2020-10-03 ENCOUNTER — Ambulatory Visit (INDEPENDENT_AMBULATORY_CARE_PROVIDER_SITE_OTHER): Payer: Medicare HMO | Admitting: Obstetrics & Gynecology

## 2020-10-03 ENCOUNTER — Encounter (HOSPITAL_BASED_OUTPATIENT_CLINIC_OR_DEPARTMENT_OTHER): Payer: Self-pay | Admitting: Obstetrics & Gynecology

## 2020-10-03 ENCOUNTER — Other Ambulatory Visit: Payer: Self-pay

## 2020-10-03 VITALS — BP 131/57 | HR 75 | Ht 66.0 in | Wt 133.2 lb

## 2020-10-03 DIAGNOSIS — D270 Benign neoplasm of right ovary: Secondary | ICD-10-CM

## 2020-10-03 DIAGNOSIS — T148XXA Other injury of unspecified body region, initial encounter: Secondary | ICD-10-CM

## 2020-10-03 MED ORDER — AMOXICILLIN 250 MG PO CAPS: 250.0000 mg | ORAL_CAPSULE | Freq: Two times a day (BID) | ORAL | 0 refills | Status: DC

## 2020-10-04 DIAGNOSIS — D631 Anemia in chronic kidney disease: Secondary | ICD-10-CM | POA: Diagnosis not present

## 2020-10-04 DIAGNOSIS — N186 End stage renal disease: Secondary | ICD-10-CM | POA: Diagnosis not present

## 2020-10-04 DIAGNOSIS — N2581 Secondary hyperparathyroidism of renal origin: Secondary | ICD-10-CM | POA: Diagnosis not present

## 2020-10-06 NOTE — Progress Notes (Signed)
GYNECOLOGY  VISIT  CC:   post op recheck  HPI: 75 y.o. G2P2 Married White or Caucasian female here for recheck after undergoing LSO on 08/20/2020.  She reports no bleeding and no pain.  Bowel and bladder function is normal.  Having additional evaluation for pulmonary issues.  Has specialist she is seeing.  Does have some pain with the LLQ incision.      Pathology reviewed:  Yes .  Questions answered.    MEDS:   Current Outpatient Medications on File Prior to Visit  Medication Sig Dispense Refill   acetaminophen (TYLENOL) 500 MG tablet Take 500 mg by mouth every 6 (six) hours as needed for headache (pain).     Alfalfa 500 MG TABS Take 500 mg by mouth 3 (three) times daily.     atorvastatin (LIPITOR) 20 MG tablet Take 20 mg by mouth at bedtime.     carvedilol (COREG) 25 MG tablet Take 1 tablet by mouth twice daily 180 tablet 3   cinacalcet (SENSIPAR) 30 MG tablet Take 30 mg by mouth at bedtime.     diltiazem (CARDIZEM CD) 240 MG 24 hr capsule Take 240 mg by mouth daily at 12 noon.     Flaxseed, Linseed, (FLAX SEED OIL) 1000 MG CAPS Take 1,000 mg by mouth daily.     hydrALAZINE (APRESOLINE) 50 MG tablet Take 50 mg by mouth 2 (two) times daily.     L-Glutamine 500 MG CAPS Take 500 mg by mouth daily after supper.     lidocaine-prilocaine (EMLA) cream Apply 1 application topically as needed [facility]).     multivitamin (RENA-VIT) TABS tablet Take 1 tablet by mouth daily.     OVER THE COUNTER MEDICATION Take 1 capsule by mouth daily. Herblax     Probiotic Product (PROBIOTIC DAILY PO) Take 1 tablet by mouth daily with lunch.      sevelamer carbonate (RENVELA) 800 MG tablet Take 2,400 mg by mouth 3 (three) times daily.     vitamin C (ASCORBIC ACID) 500 MG tablet Take 500 mg by mouth daily.     Vitamin E 400 units TABS Take 400 Units by mouth daily with lunch.     warfarin (COUMADIN) 5 MG tablet Take 1 tablet (5 mg total) by mouth daily at 4 PM. ONCE DAILY AS DIRECTED 30 tablet 0    HYDROcodone-acetaminophen (NORCO/VICODIN) 5-325 MG tablet Take 1 tablet by mouth every 6 (six) hours as needed for moderate pain or severe pain. (Patient not taking: Reported on 10/03/2020) 12 tablet 0   L-Tryptophan 500 MG CAPS Take 500 mg by mouth at bedtime. (Patient not taking: Reported on 10/03/2020)     No current facility-administered medications on file prior to visit.    SH:  Smoking No    PHYSICAL EXAMINATION:    BP (!) 131/57 (BP Location: Right Arm, Patient Position: Sitting, Cuff Size: Small)   Pulse 75   Ht 5\' 6"  (1.676 m)   Wt 133 lb 3.2 oz (60.4 kg)   BMI 21.50 kg/m     General appearance: alert, cooperative and appears stated age Abdomen: soft, non-tender; bowel sounds normal; no masses,  no organomegaly Incisions:  C/D/I, left lower incision has some small drainage with compression  Assessment/Plan: 1. Ovary, benign neoplasm, right  2. Skin wound from surgical incision, superficial on LLQ incision - creatinine clearance calculated and dosing for amoxicillin adjusted - pt to give update early next week - amoxicillin (AMOXIL) 250 MG capsule; Take 1 capsule (250 mg total)  by mouth 2 (two) times daily.  Dispense: 10 capsule; Refill: 0

## 2020-10-07 DIAGNOSIS — N186 End stage renal disease: Secondary | ICD-10-CM | POA: Diagnosis not present

## 2020-10-07 DIAGNOSIS — D631 Anemia in chronic kidney disease: Secondary | ICD-10-CM | POA: Diagnosis not present

## 2020-10-07 DIAGNOSIS — Z7901 Long term (current) use of anticoagulants: Secondary | ICD-10-CM | POA: Diagnosis not present

## 2020-10-07 DIAGNOSIS — N2581 Secondary hyperparathyroidism of renal origin: Secondary | ICD-10-CM | POA: Diagnosis not present

## 2020-10-09 DIAGNOSIS — N2581 Secondary hyperparathyroidism of renal origin: Secondary | ICD-10-CM | POA: Diagnosis not present

## 2020-10-09 DIAGNOSIS — N186 End stage renal disease: Secondary | ICD-10-CM | POA: Diagnosis not present

## 2020-10-09 DIAGNOSIS — D631 Anemia in chronic kidney disease: Secondary | ICD-10-CM | POA: Diagnosis not present

## 2020-10-10 DIAGNOSIS — H903 Sensorineural hearing loss, bilateral: Secondary | ICD-10-CM | POA: Diagnosis not present

## 2020-10-10 DIAGNOSIS — Z77122 Contact with and (suspected) exposure to noise: Secondary | ICD-10-CM | POA: Diagnosis not present

## 2020-10-11 DIAGNOSIS — N186 End stage renal disease: Secondary | ICD-10-CM | POA: Diagnosis not present

## 2020-10-11 DIAGNOSIS — N2581 Secondary hyperparathyroidism of renal origin: Secondary | ICD-10-CM | POA: Diagnosis not present

## 2020-10-11 DIAGNOSIS — D631 Anemia in chronic kidney disease: Secondary | ICD-10-CM | POA: Diagnosis not present

## 2020-10-14 ENCOUNTER — Telehealth (HOSPITAL_BASED_OUTPATIENT_CLINIC_OR_DEPARTMENT_OTHER): Payer: Self-pay | Admitting: Obstetrics & Gynecology

## 2020-10-14 DIAGNOSIS — Z7901 Long term (current) use of anticoagulants: Secondary | ICD-10-CM | POA: Diagnosis not present

## 2020-10-14 DIAGNOSIS — D631 Anemia in chronic kidney disease: Secondary | ICD-10-CM | POA: Diagnosis not present

## 2020-10-14 DIAGNOSIS — N186 End stage renal disease: Secondary | ICD-10-CM | POA: Diagnosis not present

## 2020-10-14 DIAGNOSIS — N2581 Secondary hyperparathyroidism of renal origin: Secondary | ICD-10-CM | POA: Diagnosis not present

## 2020-10-14 NOTE — Telephone Encounter (Signed)
Patient called today and wanted to let Dr.Miller know she didn't need to refill medication for the antibiotic .

## 2020-10-16 DIAGNOSIS — N186 End stage renal disease: Secondary | ICD-10-CM | POA: Diagnosis not present

## 2020-10-16 DIAGNOSIS — N2581 Secondary hyperparathyroidism of renal origin: Secondary | ICD-10-CM | POA: Diagnosis not present

## 2020-10-16 DIAGNOSIS — D631 Anemia in chronic kidney disease: Secondary | ICD-10-CM | POA: Diagnosis not present

## 2020-10-18 DIAGNOSIS — N186 End stage renal disease: Secondary | ICD-10-CM | POA: Diagnosis not present

## 2020-10-18 DIAGNOSIS — N2581 Secondary hyperparathyroidism of renal origin: Secondary | ICD-10-CM | POA: Diagnosis not present

## 2020-10-18 DIAGNOSIS — D631 Anemia in chronic kidney disease: Secondary | ICD-10-CM | POA: Diagnosis not present

## 2020-10-21 DIAGNOSIS — N2581 Secondary hyperparathyroidism of renal origin: Secondary | ICD-10-CM | POA: Diagnosis not present

## 2020-10-21 DIAGNOSIS — Z7901 Long term (current) use of anticoagulants: Secondary | ICD-10-CM | POA: Diagnosis not present

## 2020-10-21 DIAGNOSIS — D631 Anemia in chronic kidney disease: Secondary | ICD-10-CM | POA: Diagnosis not present

## 2020-10-21 DIAGNOSIS — N186 End stage renal disease: Secondary | ICD-10-CM | POA: Diagnosis not present

## 2020-10-23 DIAGNOSIS — N186 End stage renal disease: Secondary | ICD-10-CM | POA: Diagnosis not present

## 2020-10-23 DIAGNOSIS — D631 Anemia in chronic kidney disease: Secondary | ICD-10-CM | POA: Diagnosis not present

## 2020-10-23 DIAGNOSIS — N2581 Secondary hyperparathyroidism of renal origin: Secondary | ICD-10-CM | POA: Diagnosis not present

## 2020-10-24 ENCOUNTER — Encounter: Payer: Self-pay | Admitting: Family Medicine

## 2020-10-24 ENCOUNTER — Other Ambulatory Visit: Payer: Self-pay

## 2020-10-24 ENCOUNTER — Telehealth (INDEPENDENT_AMBULATORY_CARE_PROVIDER_SITE_OTHER): Payer: Medicare HMO | Admitting: Family Medicine

## 2020-10-24 DIAGNOSIS — N184 Chronic kidney disease, stage 4 (severe): Secondary | ICD-10-CM | POA: Diagnosis not present

## 2020-10-24 DIAGNOSIS — E782 Mixed hyperlipidemia: Secondary | ICD-10-CM

## 2020-10-24 DIAGNOSIS — I480 Paroxysmal atrial fibrillation: Secondary | ICD-10-CM | POA: Diagnosis not present

## 2020-10-24 DIAGNOSIS — D631 Anemia in chronic kidney disease: Secondary | ICD-10-CM | POA: Diagnosis not present

## 2020-10-24 DIAGNOSIS — I10 Essential (primary) hypertension: Secondary | ICD-10-CM

## 2020-10-24 DIAGNOSIS — N186 End stage renal disease: Secondary | ICD-10-CM

## 2020-10-24 NOTE — Progress Notes (Signed)
MyChart Video Visit    Virtual Visit via Video Note   This visit type was conducted due to national recommendations for restrictions regarding the COVID-19 Pandemic (e.g. social distancing) in an effort to limit this patient's exposure and mitigate transmission in our community. This patient is at least at moderate risk for complications without adequate follow up. This format is felt to be most appropriate for this patient at this time. Physical exam was limited by quality of the video and audio technology used for the visit. Nena Alexander, CMA was able to get the patient set up on a video visit.  Patient location: home Patient and provider in visit Provider location: Office  I discussed the limitations of evaluation and management by telemedicine and the availability of in person appointments. The patient expressed understanding and agreed to proceed.  Visit Date: 10/25/20  Today's healthcare provider: [name], MD     Subjective:    Patient ID: Jillian Hayes, female    DOB: 1945-07-06, 75 y.o.   MRN: [id number]  Chief Complaint  Patient presents with   follow medication    HPI Patient is in today for virtual visit for follow up on HTN and ESRD. On July 19th she had partial hysterectomy of right ovary and tubes. Her first covid infection was in February and her second was last August. Denies CP/palp/SOB/HA/congestion/fevers/GI or GU c/o. Taking meds as prescribed. At the moment she is not on the transplant list because of possible MAC infection holding her off. Needs test to verify.   Past Medical History:  Diagnosis Date   Advanced care planning/counseling discussion 06/10/2014   05/31/2014 patient presents copy of HCP and Living Will   Anemia    iron deficiency   Anxiety    Arthritis    "in hands"   Atrial fibrillation ([place])    Benign fundic gland polyps of stomach    Bladder polyps 06/25/2010   Chronic headaches 56/25/6389   Complication of anesthesia     "woke up at the end of a cyst removal surgery in 1991"   Depression 1991   hospitalized   ESRD (end stage renal disease) ([place]) 11/09/2015   HD on MWF   History of chicken pox 06/25/2010   Hypercalcemia 02/18/2014   Hypertension    Insomnia 06/24/2010   Multiple chemical sensitivity syndrome 06/25/2010   Proteinuria 02/18/2014   Renal insufficiency 03/26/2011   Valvular heart disease 04/28/2016    Past Surgical History:  Procedure Laterality Date   AV FISTULA PLACEMENT Left    x4   COLONOSCOPY  2014   cyst on left breast removed Left 1991   benign   ESOPHAGOGASTRODUODENOSCOPY (EGD) WITH ESOPHAGEAL DILATION  2014   LAPAROSCOPIC BILATERAL SALPINGO OOPHERECTOMY Bilateral 08/20/2020   Procedure: LAPAROSCOPIC BILATERAL SALPINGO OOPHORECTOMY;  Surgeon: Megan Salon, MD;  Location: [facility];  Service: Gynecology;  Laterality: Bilateral;   NASAL SEPTUM SURGERY  1986   rhinoplasty   polyps on bladder removed  1972   benign   TONSILLECTOMY  1962    Family History  Problem Relation Age of Onset   Breast cancer Mother 43       left breast removed, 2010 lung   Diabetes Mother    Nephrolithiasis Father    Heart attack Father 75       X 3   Heart disease Father        smoker   Hypertension Brother    Allergic rhinitis Brother  Melanoma Son 28       melanoma on leg removed   Pancreatic cancer Maternal Grandmother    Hypertension Paternal Grandmother    Obesity Paternal Grandmother    Heart attack Paternal Grandfather 77   Diabetes Maternal Aunt        x 2   Diabetes Maternal Uncle        x 3   Asthma Neg Hx    Eczema Neg Hx    Urticaria Neg Hx    Immunodeficiency Neg Hx    Angioedema Neg Hx     Social History   Socioeconomic History   Marital status: Married    Spouse name: Not on file   Number of children: 2   Years of education: Not on file   Highest education level: Not on file  Occupational History   Occupation: retired  Tobacco Use   Smoking status: Never    Smokeless tobacco: Never  Vaping Use   Vaping Use: Never used  Substance and Sexual Activity   Alcohol use: No   Drug use: No   Sexual activity: Yes    Partners: Male  Other Topics Concern   Not on file  Social History Narrative   Married and lives with husband.     Retired   1 son and 1 daughter   No alcohol tobacco or caffeine   Social Determinants of Radio broadcast assistant Strain: Not on file  Food Insecurity: Not on file  Transportation Needs: Not on file  Physical Activity: Not on file  Stress: Not on file  Social Connections: Not on file  Intimate Partner Violence: Not on file    Outpatient Medications Prior to Visit  Medication Sig Dispense Refill   acetaminophen (TYLENOL) 500 MG tablet Take 500 mg by mouth every 6 (six) hours as needed for headache (pain).     Alfalfa 500 MG TABS Take 500 mg by mouth 3 (three) times daily.     atorvastatin (LIPITOR) 20 MG tablet Take 20 mg by mouth at bedtime.     carvedilol (COREG) 25 MG tablet Take 1 tablet by mouth twice daily 180 tablet 3   cinacalcet (SENSIPAR) 30 MG tablet Take 30 mg by mouth at bedtime.     diltiazem (CARDIZEM CD) 240 MG 24 hr capsule Take 240 mg by mouth daily at 12 noon.     Flaxseed, Linseed, (FLAX SEED OIL) 1000 MG CAPS Take 1,000 mg by mouth daily.     hydrALAZINE (APRESOLINE) 50 MG tablet Take 50 mg by mouth 2 (two) times daily.     L-Glutamine 500 MG CAPS Take 500 mg by mouth daily after supper.     L-Tryptophan 500 MG CAPS Take 500 mg by mouth at bedtime.     lidocaine-prilocaine (EMLA) cream Apply 1 application topically as needed [facility]).     multivitamin (RENA-VIT) TABS tablet Take 1 tablet by mouth daily.     OVER THE COUNTER MEDICATION Take 1 capsule by mouth daily. Herblax     Probiotic Product (PROBIOTIC DAILY PO) Take 1 tablet by mouth daily with lunch.      sevelamer carbonate (RENVELA) 800 MG tablet Take 2,400 mg by mouth 3 (three) times daily.     vitamin C (ASCORBIC ACID) 500 MG  tablet Take 500 mg by mouth daily.     Vitamin E 400 units TABS Take 400 Units by mouth daily with lunch.     warfarin (COUMADIN) 5 MG tablet Take 1 tablet (  5 mg total) by mouth daily at 4 PM. ONCE DAILY AS DIRECTED 30 tablet 0   amoxicillin (AMOXIL) 250 MG capsule Take 1 capsule (250 mg total) by mouth 2 (two) times daily. 10 capsule 0   HYDROcodone-acetaminophen (NORCO/VICODIN) 5-325 MG tablet Take 1 tablet by mouth every 6 (six) hours as needed for moderate pain or severe pain. (Patient not taking: Reported on 10/03/2020) 12 tablet 0   No facility-administered medications prior to visit.    Allergies  Allergen Reactions   Sulfa Antibiotics Nausea And Vomiting   Clonidine Derivatives Palpitations    Review of Systems  Constitutional:  Negative for chills, fever and malaise/fatigue.  HENT:  Negative for congestion, sinus pain and sore throat.   Eyes:  Negative for blurred vision.  Respiratory:  Negative for cough and shortness of breath.   Cardiovascular:  Negative for chest pain, palpitations and leg swelling.  Gastrointestinal:  Negative for blood in stool, diarrhea, nausea and vomiting.  Genitourinary:  Negative for flank pain and frequency.  Musculoskeletal:  Negative for back pain.  Skin:  Negative for rash.  Neurological:  Negative for headaches.      Objective:    Physical Exam Constitutional:      Appearance: Normal appearance.  HENT:     Head: Normocephalic and atraumatic.     Right Ear: External ear normal.     Left Ear: External ear normal.  Pulmonary:     Effort: Pulmonary effort is normal.  Musculoskeletal:        General: Normal range of motion.     Cervical back: Normal range of motion.  Skin:    General: Skin is dry.  Neurological:     Mental Status: She is alert and oriented to person, place, and time.  Psychiatric:        Behavior: Behavior normal.    There were no vitals taken for this visit. Wt Readings from Last 3 Encounters:  10/03/20 133 lb  3.2 oz (60.4 kg)  08/20/20 132 lb 14.5 oz (60.3 kg)  08/15/20 134 lb 1.6 oz (60.8 kg)    Diabetic Foot Exam - Simple   No data filed    Lab Results  Component Value Date   WBC 4.6 08/15/2020   HGB 12.9 08/20/2020   HCT 38.0 08/20/2020   PLT 187 08/15/2020   GLUCOSE 86 08/20/2020   CHOL 127 12/06/2019   TRIG 126 12/06/2019   HDL 84 (A) 11/08/2019   LDLCALC 30 11/08/2019   ALT 20 08/15/2020   AST 31 08/15/2020   NA 137 08/20/2020   K 4.1 08/20/2020   CL 104 08/20/2020   CREATININE 5.00 (H) 08/20/2020   BUN 43 (H) 08/20/2020   CO2 27 08/15/2020   TSH 0.25 (L) 09/20/2018   INR 1.2 08/20/2020   HGBA1C 5.7 (H) 10/25/2017    Lab Results  Component Value Date   TSH 0.25 (L) 09/20/2018   Lab Results  Component Value Date   WBC 4.6 08/15/2020   HGB 12.9 08/20/2020   HCT 38.0 08/20/2020   MCV 103.7 (H) 08/15/2020   PLT 187 08/15/2020   Lab Results  Component Value Date   NA 137 08/20/2020   K 4.1 08/20/2020   CHLORIDE 109 03/25/2016   CO2 27 08/15/2020   GLUCOSE 86 08/20/2020   BUN 43 (H) 08/20/2020   CREATININE 5.00 (H) 08/20/2020   BILITOT 0.7 08/15/2020   ALKPHOS 80 08/15/2020   AST 31 08/15/2020   ALT 20 08/15/2020  PROT 6.2 (L) 08/15/2020   ALBUMIN 3.6 08/15/2020   CALCIUM 9.4 08/15/2020   ANIONGAP 11 08/15/2020   EGFR 20 (L) 03/25/2016   GFR 11.58 (LL) 09/20/2018   Lab Results  Component Value Date   CHOL 127 12/06/2019   Lab Results  Component Value Date   HDL 84 (A) 11/08/2019   Lab Results  Component Value Date   LDLCALC 30 11/08/2019   Lab Results  Component Value Date   TRIG 126 12/06/2019   Lab Results  Component Value Date   CHOLHDL 2 09/20/2018   Lab Results  Component Value Date   HGBA1C 5.7 (H) 10/25/2017       Assessment & Plan:   Problem List Items Addressed This Visit     HTN (hypertension)    Monitor and report any concerns, no changes to meds. Encouraged heart healthy diet such as the DASH diet and exercise as  tolerated.       ESRD (end stage renal disease) ([place])    Follows with nephrology and is being prepared for the transplant list although there is some concern of an MAC infection so that is being worked up at this time      Anemia of chronic renal failure, stage 4 (severe) ([place])    She is following closely with nephrology and doing well      Hyperlipidemia    Encourage heart healthy diet such as MIND or DASH diet, increase exercise, avoid trans fats, simple carbohydrates and processed foods, consider a krill or fish or flaxseed oil cap daily. Tolerating statin      Atrial fibrillation [facility])    Follows with cardiology, stable         No orders of the defined types were placed in this encounter.   I discussed the assessment and treatment plan with the patient. The patient was provided an opportunity to ask questions and all were answered. The patient agreed with the plan and demonstrated an understanding of the instructions.   The patient was advised to call back or seek an in-person evaluation if the symptoms worsen or if the condition fails to improve as anticipated.  I provided 20 minutes of face-to-face time during this encounter.   [name], MD [facility] at [facility] [phone] (phone) [phone] (fax)  [place], Suezanne Jacquet, acting as a scribe for [name], MD, have documented all relevent documentation on behalf of [name], MD, as directed by [name], MD while in the presence of [name], MD. DO:10/25/20.  I, [name], MD personally performed the services described in this documentation. All medical record entries made by the scribe were at my direction and in my presence. I have reviewed the chart and agree that the record reflects my personal performance and is accurate and complete

## 2020-10-25 DIAGNOSIS — D631 Anemia in chronic kidney disease: Secondary | ICD-10-CM | POA: Diagnosis not present

## 2020-10-25 DIAGNOSIS — N2581 Secondary hyperparathyroidism of renal origin: Secondary | ICD-10-CM | POA: Diagnosis not present

## 2020-10-25 DIAGNOSIS — N186 End stage renal disease: Secondary | ICD-10-CM | POA: Diagnosis not present

## 2020-10-25 NOTE — Assessment & Plan Note (Signed)
Follows with nephrology and is being prepared for the transplant list although there is some concern of an MAC infection so that is being worked up at this time

## 2020-10-25 NOTE — Assessment & Plan Note (Signed)
She is following closely with nephrology and doing well

## 2020-10-25 NOTE — Assessment & Plan Note (Signed)
Monitor and report any concerns, no changes to meds. Encouraged heart healthy diet such as the DASH diet and exercise as tolerated.  ?

## 2020-10-25 NOTE — Assessment & Plan Note (Signed)
Follows with cardiology, stable

## 2020-10-25 NOTE — Assessment & Plan Note (Addendum)
Encourage heart healthy diet such as MIND or DASH diet, increase exercise, avoid trans fats, simple carbohydrates and processed foods, consider a krill or fish or flaxseed oil cap daily. Tolerating statin 

## 2020-10-28 DIAGNOSIS — D631 Anemia in chronic kidney disease: Secondary | ICD-10-CM | POA: Diagnosis not present

## 2020-10-28 DIAGNOSIS — Z7901 Long term (current) use of anticoagulants: Secondary | ICD-10-CM | POA: Diagnosis not present

## 2020-10-28 DIAGNOSIS — N186 End stage renal disease: Secondary | ICD-10-CM | POA: Diagnosis not present

## 2020-10-28 DIAGNOSIS — N2581 Secondary hyperparathyroidism of renal origin: Secondary | ICD-10-CM | POA: Diagnosis not present

## 2020-10-30 DIAGNOSIS — N2581 Secondary hyperparathyroidism of renal origin: Secondary | ICD-10-CM | POA: Diagnosis not present

## 2020-10-30 DIAGNOSIS — D631 Anemia in chronic kidney disease: Secondary | ICD-10-CM | POA: Diagnosis not present

## 2020-10-30 DIAGNOSIS — N186 End stage renal disease: Secondary | ICD-10-CM | POA: Diagnosis not present

## 2020-11-01 DIAGNOSIS — N186 End stage renal disease: Secondary | ICD-10-CM | POA: Diagnosis not present

## 2020-11-01 DIAGNOSIS — Z992 Dependence on renal dialysis: Secondary | ICD-10-CM | POA: Diagnosis not present

## 2020-11-01 DIAGNOSIS — D631 Anemia in chronic kidney disease: Secondary | ICD-10-CM | POA: Diagnosis not present

## 2020-11-01 DIAGNOSIS — N2581 Secondary hyperparathyroidism of renal origin: Secondary | ICD-10-CM | POA: Diagnosis not present

## 2020-11-04 ENCOUNTER — Telehealth: Payer: Self-pay | Admitting: Family Medicine

## 2020-11-04 DIAGNOSIS — Z7901 Long term (current) use of anticoagulants: Secondary | ICD-10-CM | POA: Diagnosis not present

## 2020-11-04 DIAGNOSIS — I10 Essential (primary) hypertension: Secondary | ICD-10-CM | POA: Diagnosis not present

## 2020-11-04 DIAGNOSIS — D631 Anemia in chronic kidney disease: Secondary | ICD-10-CM | POA: Diagnosis not present

## 2020-11-04 DIAGNOSIS — Z23 Encounter for immunization: Secondary | ICD-10-CM | POA: Diagnosis not present

## 2020-11-04 DIAGNOSIS — N186 End stage renal disease: Secondary | ICD-10-CM | POA: Diagnosis not present

## 2020-11-04 DIAGNOSIS — D509 Iron deficiency anemia, unspecified: Secondary | ICD-10-CM | POA: Diagnosis not present

## 2020-11-04 DIAGNOSIS — N2581 Secondary hyperparathyroidism of renal origin: Secondary | ICD-10-CM | POA: Diagnosis not present

## 2020-11-04 NOTE — Telephone Encounter (Signed)
Left message for patient to call back and schedule Medicare Annual Wellness Visit (AWV) in office.   If not able to come in office, please offer to do virtually or by telephone.  Left office number and my jabber [phone].  Last AWV:12/20/2017  Please schedule at anytime with Nurse Health Advisor.

## 2020-11-06 DIAGNOSIS — N2581 Secondary hyperparathyroidism of renal origin: Secondary | ICD-10-CM | POA: Diagnosis not present

## 2020-11-06 DIAGNOSIS — D631 Anemia in chronic kidney disease: Secondary | ICD-10-CM | POA: Diagnosis not present

## 2020-11-06 DIAGNOSIS — Z23 Encounter for immunization: Secondary | ICD-10-CM | POA: Diagnosis not present

## 2020-11-06 DIAGNOSIS — I10 Essential (primary) hypertension: Secondary | ICD-10-CM | POA: Diagnosis not present

## 2020-11-06 DIAGNOSIS — D509 Iron deficiency anemia, unspecified: Secondary | ICD-10-CM | POA: Diagnosis not present

## 2020-11-06 DIAGNOSIS — N186 End stage renal disease: Secondary | ICD-10-CM | POA: Diagnosis not present

## 2020-11-08 DIAGNOSIS — I10 Essential (primary) hypertension: Secondary | ICD-10-CM | POA: Diagnosis not present

## 2020-11-08 DIAGNOSIS — D509 Iron deficiency anemia, unspecified: Secondary | ICD-10-CM | POA: Diagnosis not present

## 2020-11-08 DIAGNOSIS — Z23 Encounter for immunization: Secondary | ICD-10-CM | POA: Diagnosis not present

## 2020-11-08 DIAGNOSIS — N2581 Secondary hyperparathyroidism of renal origin: Secondary | ICD-10-CM | POA: Diagnosis not present

## 2020-11-08 DIAGNOSIS — N186 End stage renal disease: Secondary | ICD-10-CM | POA: Diagnosis not present

## 2020-11-08 DIAGNOSIS — D631 Anemia in chronic kidney disease: Secondary | ICD-10-CM | POA: Diagnosis not present

## 2020-11-11 DIAGNOSIS — Z7901 Long term (current) use of anticoagulants: Secondary | ICD-10-CM | POA: Diagnosis not present

## 2020-11-11 DIAGNOSIS — I10 Essential (primary) hypertension: Secondary | ICD-10-CM | POA: Diagnosis not present

## 2020-11-11 DIAGNOSIS — D631 Anemia in chronic kidney disease: Secondary | ICD-10-CM | POA: Diagnosis not present

## 2020-11-11 DIAGNOSIS — D509 Iron deficiency anemia, unspecified: Secondary | ICD-10-CM | POA: Diagnosis not present

## 2020-11-11 DIAGNOSIS — N2581 Secondary hyperparathyroidism of renal origin: Secondary | ICD-10-CM | POA: Diagnosis not present

## 2020-11-11 DIAGNOSIS — N186 End stage renal disease: Secondary | ICD-10-CM | POA: Diagnosis not present

## 2020-11-11 DIAGNOSIS — Z23 Encounter for immunization: Secondary | ICD-10-CM | POA: Diagnosis not present

## 2020-11-13 DIAGNOSIS — I10 Essential (primary) hypertension: Secondary | ICD-10-CM | POA: Diagnosis not present

## 2020-11-13 DIAGNOSIS — N2581 Secondary hyperparathyroidism of renal origin: Secondary | ICD-10-CM | POA: Diagnosis not present

## 2020-11-13 DIAGNOSIS — Z23 Encounter for immunization: Secondary | ICD-10-CM | POA: Diagnosis not present

## 2020-11-13 DIAGNOSIS — N186 End stage renal disease: Secondary | ICD-10-CM | POA: Diagnosis not present

## 2020-11-13 DIAGNOSIS — D509 Iron deficiency anemia, unspecified: Secondary | ICD-10-CM | POA: Diagnosis not present

## 2020-11-13 DIAGNOSIS — D631 Anemia in chronic kidney disease: Secondary | ICD-10-CM | POA: Diagnosis not present

## 2020-11-15 DIAGNOSIS — I10 Essential (primary) hypertension: Secondary | ICD-10-CM | POA: Diagnosis not present

## 2020-11-15 DIAGNOSIS — D509 Iron deficiency anemia, unspecified: Secondary | ICD-10-CM | POA: Diagnosis not present

## 2020-11-15 DIAGNOSIS — D631 Anemia in chronic kidney disease: Secondary | ICD-10-CM | POA: Diagnosis not present

## 2020-11-15 DIAGNOSIS — N186 End stage renal disease: Secondary | ICD-10-CM | POA: Diagnosis not present

## 2020-11-15 DIAGNOSIS — N2581 Secondary hyperparathyroidism of renal origin: Secondary | ICD-10-CM | POA: Diagnosis not present

## 2020-11-15 DIAGNOSIS — Z23 Encounter for immunization: Secondary | ICD-10-CM | POA: Diagnosis not present

## 2020-11-18 DIAGNOSIS — N186 End stage renal disease: Secondary | ICD-10-CM | POA: Diagnosis not present

## 2020-11-18 DIAGNOSIS — N2581 Secondary hyperparathyroidism of renal origin: Secondary | ICD-10-CM | POA: Diagnosis not present

## 2020-11-18 DIAGNOSIS — D509 Iron deficiency anemia, unspecified: Secondary | ICD-10-CM | POA: Diagnosis not present

## 2020-11-18 DIAGNOSIS — Z7901 Long term (current) use of anticoagulants: Secondary | ICD-10-CM | POA: Diagnosis not present

## 2020-11-18 DIAGNOSIS — Z23 Encounter for immunization: Secondary | ICD-10-CM | POA: Diagnosis not present

## 2020-11-18 DIAGNOSIS — D631 Anemia in chronic kidney disease: Secondary | ICD-10-CM | POA: Diagnosis not present

## 2020-11-18 DIAGNOSIS — I10 Essential (primary) hypertension: Secondary | ICD-10-CM | POA: Diagnosis not present

## 2020-11-20 DIAGNOSIS — N2581 Secondary hyperparathyroidism of renal origin: Secondary | ICD-10-CM | POA: Diagnosis not present

## 2020-11-20 DIAGNOSIS — N186 End stage renal disease: Secondary | ICD-10-CM | POA: Diagnosis not present

## 2020-11-20 DIAGNOSIS — D631 Anemia in chronic kidney disease: Secondary | ICD-10-CM | POA: Diagnosis not present

## 2020-11-20 DIAGNOSIS — Z23 Encounter for immunization: Secondary | ICD-10-CM | POA: Diagnosis not present

## 2020-11-20 DIAGNOSIS — D509 Iron deficiency anemia, unspecified: Secondary | ICD-10-CM | POA: Diagnosis not present

## 2020-11-20 DIAGNOSIS — I10 Essential (primary) hypertension: Secondary | ICD-10-CM | POA: Diagnosis not present

## 2020-11-21 DIAGNOSIS — I6523 Occlusion and stenosis of bilateral carotid arteries: Secondary | ICD-10-CM | POA: Insufficient documentation

## 2020-11-21 DIAGNOSIS — N186 End stage renal disease: Secondary | ICD-10-CM | POA: Diagnosis not present

## 2020-11-21 DIAGNOSIS — Z8616 Personal history of COVID-19: Secondary | ICD-10-CM | POA: Diagnosis not present

## 2020-11-21 DIAGNOSIS — I120 Hypertensive chronic kidney disease with stage 5 chronic kidney disease or end stage renal disease: Secondary | ICD-10-CM | POA: Diagnosis not present

## 2020-11-21 DIAGNOSIS — Z992 Dependence on renal dialysis: Secondary | ICD-10-CM | POA: Diagnosis not present

## 2020-11-21 DIAGNOSIS — Z7901 Long term (current) use of anticoagulants: Secondary | ICD-10-CM | POA: Diagnosis not present

## 2020-11-22 DIAGNOSIS — I10 Essential (primary) hypertension: Secondary | ICD-10-CM | POA: Diagnosis not present

## 2020-11-22 DIAGNOSIS — N186 End stage renal disease: Secondary | ICD-10-CM | POA: Diagnosis not present

## 2020-11-22 DIAGNOSIS — D631 Anemia in chronic kidney disease: Secondary | ICD-10-CM | POA: Diagnosis not present

## 2020-11-22 DIAGNOSIS — D509 Iron deficiency anemia, unspecified: Secondary | ICD-10-CM | POA: Diagnosis not present

## 2020-11-22 DIAGNOSIS — N2581 Secondary hyperparathyroidism of renal origin: Secondary | ICD-10-CM | POA: Diagnosis not present

## 2020-11-22 DIAGNOSIS — Z23 Encounter for immunization: Secondary | ICD-10-CM | POA: Diagnosis not present

## 2020-11-25 DIAGNOSIS — N2581 Secondary hyperparathyroidism of renal origin: Secondary | ICD-10-CM | POA: Diagnosis not present

## 2020-11-25 DIAGNOSIS — Z23 Encounter for immunization: Secondary | ICD-10-CM | POA: Diagnosis not present

## 2020-11-25 DIAGNOSIS — I10 Essential (primary) hypertension: Secondary | ICD-10-CM | POA: Diagnosis not present

## 2020-11-25 DIAGNOSIS — N186 End stage renal disease: Secondary | ICD-10-CM | POA: Diagnosis not present

## 2020-11-25 DIAGNOSIS — Z7901 Long term (current) use of anticoagulants: Secondary | ICD-10-CM | POA: Diagnosis not present

## 2020-11-25 DIAGNOSIS — D509 Iron deficiency anemia, unspecified: Secondary | ICD-10-CM | POA: Diagnosis not present

## 2020-11-25 DIAGNOSIS — D631 Anemia in chronic kidney disease: Secondary | ICD-10-CM | POA: Diagnosis not present

## 2020-11-27 DIAGNOSIS — Z23 Encounter for immunization: Secondary | ICD-10-CM | POA: Diagnosis not present

## 2020-11-27 DIAGNOSIS — N2581 Secondary hyperparathyroidism of renal origin: Secondary | ICD-10-CM | POA: Diagnosis not present

## 2020-11-27 DIAGNOSIS — N186 End stage renal disease: Secondary | ICD-10-CM | POA: Diagnosis not present

## 2020-11-27 DIAGNOSIS — I10 Essential (primary) hypertension: Secondary | ICD-10-CM | POA: Diagnosis not present

## 2020-11-27 DIAGNOSIS — D509 Iron deficiency anemia, unspecified: Secondary | ICD-10-CM | POA: Diagnosis not present

## 2020-11-27 DIAGNOSIS — D631 Anemia in chronic kidney disease: Secondary | ICD-10-CM | POA: Diagnosis not present

## 2020-11-29 DIAGNOSIS — D509 Iron deficiency anemia, unspecified: Secondary | ICD-10-CM | POA: Diagnosis not present

## 2020-11-29 DIAGNOSIS — N2581 Secondary hyperparathyroidism of renal origin: Secondary | ICD-10-CM | POA: Diagnosis not present

## 2020-11-29 DIAGNOSIS — N186 End stage renal disease: Secondary | ICD-10-CM | POA: Diagnosis not present

## 2020-11-29 DIAGNOSIS — Z23 Encounter for immunization: Secondary | ICD-10-CM | POA: Diagnosis not present

## 2020-11-29 DIAGNOSIS — D631 Anemia in chronic kidney disease: Secondary | ICD-10-CM | POA: Diagnosis not present

## 2020-11-29 DIAGNOSIS — I10 Essential (primary) hypertension: Secondary | ICD-10-CM | POA: Diagnosis not present

## 2020-12-02 DIAGNOSIS — Z992 Dependence on renal dialysis: Secondary | ICD-10-CM | POA: Diagnosis not present

## 2020-12-02 DIAGNOSIS — D631 Anemia in chronic kidney disease: Secondary | ICD-10-CM | POA: Diagnosis not present

## 2020-12-02 DIAGNOSIS — N186 End stage renal disease: Secondary | ICD-10-CM | POA: Diagnosis not present

## 2020-12-02 DIAGNOSIS — I10 Essential (primary) hypertension: Secondary | ICD-10-CM | POA: Diagnosis not present

## 2020-12-02 DIAGNOSIS — D509 Iron deficiency anemia, unspecified: Secondary | ICD-10-CM | POA: Diagnosis not present

## 2020-12-02 DIAGNOSIS — Z7901 Long term (current) use of anticoagulants: Secondary | ICD-10-CM | POA: Diagnosis not present

## 2020-12-02 DIAGNOSIS — Z23 Encounter for immunization: Secondary | ICD-10-CM | POA: Diagnosis not present

## 2020-12-02 DIAGNOSIS — N2581 Secondary hyperparathyroidism of renal origin: Secondary | ICD-10-CM | POA: Diagnosis not present

## 2020-12-04 DIAGNOSIS — N2581 Secondary hyperparathyroidism of renal origin: Secondary | ICD-10-CM | POA: Diagnosis not present

## 2020-12-04 DIAGNOSIS — D509 Iron deficiency anemia, unspecified: Secondary | ICD-10-CM | POA: Diagnosis not present

## 2020-12-04 DIAGNOSIS — D631 Anemia in chronic kidney disease: Secondary | ICD-10-CM | POA: Diagnosis not present

## 2020-12-04 DIAGNOSIS — N186 End stage renal disease: Secondary | ICD-10-CM | POA: Diagnosis not present

## 2020-12-05 DIAGNOSIS — H6505 Acute serous otitis media, recurrent, left ear: Secondary | ICD-10-CM | POA: Diagnosis not present

## 2020-12-06 DIAGNOSIS — D631 Anemia in chronic kidney disease: Secondary | ICD-10-CM | POA: Diagnosis not present

## 2020-12-06 DIAGNOSIS — N186 End stage renal disease: Secondary | ICD-10-CM | POA: Diagnosis not present

## 2020-12-06 DIAGNOSIS — D509 Iron deficiency anemia, unspecified: Secondary | ICD-10-CM | POA: Diagnosis not present

## 2020-12-06 DIAGNOSIS — N2581 Secondary hyperparathyroidism of renal origin: Secondary | ICD-10-CM | POA: Diagnosis not present

## 2020-12-09 DIAGNOSIS — N186 End stage renal disease: Secondary | ICD-10-CM | POA: Diagnosis not present

## 2020-12-09 DIAGNOSIS — N2581 Secondary hyperparathyroidism of renal origin: Secondary | ICD-10-CM | POA: Diagnosis not present

## 2020-12-09 DIAGNOSIS — Z7901 Long term (current) use of anticoagulants: Secondary | ICD-10-CM | POA: Diagnosis not present

## 2020-12-09 DIAGNOSIS — D631 Anemia in chronic kidney disease: Secondary | ICD-10-CM | POA: Diagnosis not present

## 2020-12-09 DIAGNOSIS — D509 Iron deficiency anemia, unspecified: Secondary | ICD-10-CM | POA: Diagnosis not present

## 2020-12-11 DIAGNOSIS — D631 Anemia in chronic kidney disease: Secondary | ICD-10-CM | POA: Diagnosis not present

## 2020-12-11 DIAGNOSIS — N2581 Secondary hyperparathyroidism of renal origin: Secondary | ICD-10-CM | POA: Diagnosis not present

## 2020-12-11 DIAGNOSIS — N186 End stage renal disease: Secondary | ICD-10-CM | POA: Diagnosis not present

## 2020-12-11 DIAGNOSIS — D509 Iron deficiency anemia, unspecified: Secondary | ICD-10-CM | POA: Diagnosis not present

## 2020-12-13 DIAGNOSIS — N2581 Secondary hyperparathyroidism of renal origin: Secondary | ICD-10-CM | POA: Diagnosis not present

## 2020-12-13 DIAGNOSIS — N186 End stage renal disease: Secondary | ICD-10-CM | POA: Diagnosis not present

## 2020-12-13 DIAGNOSIS — D509 Iron deficiency anemia, unspecified: Secondary | ICD-10-CM | POA: Diagnosis not present

## 2020-12-13 DIAGNOSIS — D631 Anemia in chronic kidney disease: Secondary | ICD-10-CM | POA: Diagnosis not present

## 2020-12-16 DIAGNOSIS — D631 Anemia in chronic kidney disease: Secondary | ICD-10-CM | POA: Diagnosis not present

## 2020-12-16 DIAGNOSIS — Z7901 Long term (current) use of anticoagulants: Secondary | ICD-10-CM | POA: Diagnosis not present

## 2020-12-16 DIAGNOSIS — N186 End stage renal disease: Secondary | ICD-10-CM | POA: Diagnosis not present

## 2020-12-16 DIAGNOSIS — N2581 Secondary hyperparathyroidism of renal origin: Secondary | ICD-10-CM | POA: Diagnosis not present

## 2020-12-16 DIAGNOSIS — D509 Iron deficiency anemia, unspecified: Secondary | ICD-10-CM | POA: Diagnosis not present

## 2020-12-18 DIAGNOSIS — D631 Anemia in chronic kidney disease: Secondary | ICD-10-CM | POA: Diagnosis not present

## 2020-12-18 DIAGNOSIS — N2581 Secondary hyperparathyroidism of renal origin: Secondary | ICD-10-CM | POA: Diagnosis not present

## 2020-12-18 DIAGNOSIS — N186 End stage renal disease: Secondary | ICD-10-CM | POA: Diagnosis not present

## 2020-12-18 DIAGNOSIS — D509 Iron deficiency anemia, unspecified: Secondary | ICD-10-CM | POA: Diagnosis not present

## 2020-12-20 DIAGNOSIS — N2581 Secondary hyperparathyroidism of renal origin: Secondary | ICD-10-CM | POA: Diagnosis not present

## 2020-12-20 DIAGNOSIS — D631 Anemia in chronic kidney disease: Secondary | ICD-10-CM | POA: Diagnosis not present

## 2020-12-20 DIAGNOSIS — D509 Iron deficiency anemia, unspecified: Secondary | ICD-10-CM | POA: Diagnosis not present

## 2020-12-20 DIAGNOSIS — N186 End stage renal disease: Secondary | ICD-10-CM | POA: Diagnosis not present

## 2020-12-22 DIAGNOSIS — D631 Anemia in chronic kidney disease: Secondary | ICD-10-CM | POA: Diagnosis not present

## 2020-12-22 DIAGNOSIS — N2581 Secondary hyperparathyroidism of renal origin: Secondary | ICD-10-CM | POA: Diagnosis not present

## 2020-12-22 DIAGNOSIS — Z7901 Long term (current) use of anticoagulants: Secondary | ICD-10-CM | POA: Diagnosis not present

## 2020-12-22 DIAGNOSIS — N186 End stage renal disease: Secondary | ICD-10-CM | POA: Diagnosis not present

## 2020-12-22 DIAGNOSIS — D509 Iron deficiency anemia, unspecified: Secondary | ICD-10-CM | POA: Diagnosis not present

## 2020-12-24 DIAGNOSIS — D509 Iron deficiency anemia, unspecified: Secondary | ICD-10-CM | POA: Diagnosis not present

## 2020-12-24 DIAGNOSIS — D631 Anemia in chronic kidney disease: Secondary | ICD-10-CM | POA: Diagnosis not present

## 2020-12-24 DIAGNOSIS — N2581 Secondary hyperparathyroidism of renal origin: Secondary | ICD-10-CM | POA: Diagnosis not present

## 2020-12-24 DIAGNOSIS — N186 End stage renal disease: Secondary | ICD-10-CM | POA: Diagnosis not present

## 2020-12-27 DIAGNOSIS — N2581 Secondary hyperparathyroidism of renal origin: Secondary | ICD-10-CM | POA: Diagnosis not present

## 2020-12-27 DIAGNOSIS — D509 Iron deficiency anemia, unspecified: Secondary | ICD-10-CM | POA: Diagnosis not present

## 2020-12-27 DIAGNOSIS — N186 End stage renal disease: Secondary | ICD-10-CM | POA: Diagnosis not present

## 2020-12-27 DIAGNOSIS — D631 Anemia in chronic kidney disease: Secondary | ICD-10-CM | POA: Diagnosis not present

## 2020-12-30 DIAGNOSIS — Z7901 Long term (current) use of anticoagulants: Secondary | ICD-10-CM | POA: Diagnosis not present

## 2020-12-30 DIAGNOSIS — D509 Iron deficiency anemia, unspecified: Secondary | ICD-10-CM | POA: Diagnosis not present

## 2020-12-30 DIAGNOSIS — N186 End stage renal disease: Secondary | ICD-10-CM | POA: Diagnosis not present

## 2020-12-30 DIAGNOSIS — D631 Anemia in chronic kidney disease: Secondary | ICD-10-CM | POA: Diagnosis not present

## 2020-12-30 DIAGNOSIS — N2581 Secondary hyperparathyroidism of renal origin: Secondary | ICD-10-CM | POA: Diagnosis not present

## 2021-01-01 DIAGNOSIS — D631 Anemia in chronic kidney disease: Secondary | ICD-10-CM | POA: Diagnosis not present

## 2021-01-01 DIAGNOSIS — D509 Iron deficiency anemia, unspecified: Secondary | ICD-10-CM | POA: Diagnosis not present

## 2021-01-01 DIAGNOSIS — N2581 Secondary hyperparathyroidism of renal origin: Secondary | ICD-10-CM | POA: Diagnosis not present

## 2021-01-01 DIAGNOSIS — N186 End stage renal disease: Secondary | ICD-10-CM | POA: Diagnosis not present

## 2021-01-01 DIAGNOSIS — Z992 Dependence on renal dialysis: Secondary | ICD-10-CM | POA: Diagnosis not present

## 2021-01-03 DIAGNOSIS — N2581 Secondary hyperparathyroidism of renal origin: Secondary | ICD-10-CM | POA: Diagnosis not present

## 2021-01-03 DIAGNOSIS — N186 End stage renal disease: Secondary | ICD-10-CM | POA: Diagnosis not present

## 2021-01-03 DIAGNOSIS — D631 Anemia in chronic kidney disease: Secondary | ICD-10-CM | POA: Diagnosis not present

## 2021-01-06 DIAGNOSIS — D631 Anemia in chronic kidney disease: Secondary | ICD-10-CM | POA: Diagnosis not present

## 2021-01-06 DIAGNOSIS — N2581 Secondary hyperparathyroidism of renal origin: Secondary | ICD-10-CM | POA: Diagnosis not present

## 2021-01-06 DIAGNOSIS — Z7901 Long term (current) use of anticoagulants: Secondary | ICD-10-CM | POA: Diagnosis not present

## 2021-01-06 DIAGNOSIS — N186 End stage renal disease: Secondary | ICD-10-CM | POA: Diagnosis not present

## 2021-01-07 ENCOUNTER — Other Ambulatory Visit: Payer: Self-pay

## 2021-01-07 ENCOUNTER — Telehealth: Payer: Self-pay | Admitting: Family Medicine

## 2021-01-07 MED ORDER — FAMOTIDINE 10 MG PO TABS: 10.0000 mg | ORAL_TABLET | Freq: Two times a day (BID) | ORAL | 1 refills | Status: DC

## 2021-01-07 NOTE — Telephone Encounter (Signed)
Pt aware and medication sent in

## 2021-01-07 NOTE — Telephone Encounter (Signed)
Pt called and stated she is having issues with acid reflux really bad. She wants to know does she need to see you or a GI. Please advise.

## 2021-01-08 DIAGNOSIS — N2581 Secondary hyperparathyroidism of renal origin: Secondary | ICD-10-CM | POA: Diagnosis not present

## 2021-01-08 DIAGNOSIS — D631 Anemia in chronic kidney disease: Secondary | ICD-10-CM | POA: Diagnosis not present

## 2021-01-08 DIAGNOSIS — N186 End stage renal disease: Secondary | ICD-10-CM | POA: Diagnosis not present

## 2021-01-10 DIAGNOSIS — D631 Anemia in chronic kidney disease: Secondary | ICD-10-CM | POA: Diagnosis not present

## 2021-01-10 DIAGNOSIS — N186 End stage renal disease: Secondary | ICD-10-CM | POA: Diagnosis not present

## 2021-01-10 DIAGNOSIS — N2581 Secondary hyperparathyroidism of renal origin: Secondary | ICD-10-CM | POA: Diagnosis not present

## 2021-01-13 DIAGNOSIS — D631 Anemia in chronic kidney disease: Secondary | ICD-10-CM | POA: Diagnosis not present

## 2021-01-13 DIAGNOSIS — N186 End stage renal disease: Secondary | ICD-10-CM | POA: Diagnosis not present

## 2021-01-13 DIAGNOSIS — N2581 Secondary hyperparathyroidism of renal origin: Secondary | ICD-10-CM | POA: Diagnosis not present

## 2021-01-13 DIAGNOSIS — Z7901 Long term (current) use of anticoagulants: Secondary | ICD-10-CM | POA: Diagnosis not present

## 2021-01-15 DIAGNOSIS — N186 End stage renal disease: Secondary | ICD-10-CM | POA: Diagnosis not present

## 2021-01-15 DIAGNOSIS — N2581 Secondary hyperparathyroidism of renal origin: Secondary | ICD-10-CM | POA: Diagnosis not present

## 2021-01-15 DIAGNOSIS — D631 Anemia in chronic kidney disease: Secondary | ICD-10-CM | POA: Diagnosis not present

## 2021-01-17 DIAGNOSIS — N2581 Secondary hyperparathyroidism of renal origin: Secondary | ICD-10-CM | POA: Diagnosis not present

## 2021-01-17 DIAGNOSIS — N186 End stage renal disease: Secondary | ICD-10-CM | POA: Diagnosis not present

## 2021-01-17 DIAGNOSIS — D631 Anemia in chronic kidney disease: Secondary | ICD-10-CM | POA: Diagnosis not present

## 2021-01-20 DIAGNOSIS — N186 End stage renal disease: Secondary | ICD-10-CM | POA: Diagnosis not present

## 2021-01-20 DIAGNOSIS — Z7901 Long term (current) use of anticoagulants: Secondary | ICD-10-CM | POA: Diagnosis not present

## 2021-01-20 DIAGNOSIS — N2581 Secondary hyperparathyroidism of renal origin: Secondary | ICD-10-CM | POA: Diagnosis not present

## 2021-01-20 DIAGNOSIS — D631 Anemia in chronic kidney disease: Secondary | ICD-10-CM | POA: Diagnosis not present

## 2021-01-22 DIAGNOSIS — N2581 Secondary hyperparathyroidism of renal origin: Secondary | ICD-10-CM | POA: Diagnosis not present

## 2021-01-22 DIAGNOSIS — N186 End stage renal disease: Secondary | ICD-10-CM | POA: Diagnosis not present

## 2021-01-22 DIAGNOSIS — D631 Anemia in chronic kidney disease: Secondary | ICD-10-CM | POA: Diagnosis not present

## 2021-01-24 DIAGNOSIS — D631 Anemia in chronic kidney disease: Secondary | ICD-10-CM | POA: Diagnosis not present

## 2021-01-24 DIAGNOSIS — N2581 Secondary hyperparathyroidism of renal origin: Secondary | ICD-10-CM | POA: Diagnosis not present

## 2021-01-24 DIAGNOSIS — N186 End stage renal disease: Secondary | ICD-10-CM | POA: Diagnosis not present

## 2021-01-27 DIAGNOSIS — Z7901 Long term (current) use of anticoagulants: Secondary | ICD-10-CM | POA: Diagnosis not present

## 2021-01-27 DIAGNOSIS — D631 Anemia in chronic kidney disease: Secondary | ICD-10-CM | POA: Diagnosis not present

## 2021-01-27 DIAGNOSIS — N2581 Secondary hyperparathyroidism of renal origin: Secondary | ICD-10-CM | POA: Diagnosis not present

## 2021-01-27 DIAGNOSIS — N186 End stage renal disease: Secondary | ICD-10-CM | POA: Diagnosis not present

## 2021-01-28 NOTE — Progress Notes (Signed)
Subjective:   HANIYA FERN is a 75 y.o. female who presents for Medicare Annual (Subsequent) preventive examination.  Review of Systems     Cardiac Risk Factors include: hypertension;advanced age (>69men, >59 women);dyslipidemia     Objective:    Today's Vitals   01/30/21 1257 01/30/21 1302  BP: (!) 160/68   Pulse: 73   Resp: 16   Temp: 98.2 F (36.8 C)   TempSrc: Oral   SpO2: 96%   Weight: 134 lb 9.6 oz (61.1 kg)   Height: 5\' 7"  (1.702 m)   PainSc:  5    Body mass index is 21.08 kg/m.  Advanced Directives 01/30/2021 08/15/2020 12/03/2019 10/20/2019 10/26/2017 10/25/2017 12/17/2016  Does Patient Have a Medical Advance Directive? Yes Yes No Yes Yes Yes Yes  Type of Paramedic of [place];Living will [place];Living will - [place];Living will [place];Living will [place];Living will [place];Living will  Does patient want to make changes to medical advance directive? - No - Patient declined - - No - Patient declined - -  Copy of [place] in Chart? Yes - validated most recent copy scanned in chart (See row information) Yes - validated most recent copy scanned in chart (See row information) - - Yes - Yes  Would patient like information on creating a medical advance directive? - - Yes (ED - Information included in AVS) - - - -    Current Medications (verified) Outpatient Encounter Medications as of 01/30/2021  Medication Sig   acetaminophen (TYLENOL) 500 MG tablet Take 500 mg by mouth every 6 (six) hours as needed for headache (pain).   Alfalfa 500 MG TABS Take 500 mg by mouth 3 (three) times daily.   atorvastatin (LIPITOR) 20 MG tablet Take 20 mg by mouth at bedtime.   carvedilol (COREG) 25 MG tablet Take 1 tablet by mouth twice daily   cinacalcet (SENSIPAR) 30 MG tablet Take 30 mg by mouth at bedtime.   diltiazem (CARDIZEM CD) 240  MG 24 hr capsule Take 240 mg by mouth daily at 12 noon.   Flaxseed, Linseed, (FLAX SEED OIL) 1000 MG CAPS Take 1,000 mg by mouth daily.   hydrALAZINE (APRESOLINE) 50 MG tablet Take 50 mg by mouth 2 (two) times daily.   L-Glutamine 500 MG CAPS Take 500 mg by mouth daily after supper.   multivitamin (RENA-VIT) TABS tablet Take 1 tablet by mouth daily.   OVER THE COUNTER MEDICATION Take 1 capsule by mouth daily. Herblax   Probiotic Product (PROBIOTIC DAILY PO) Take 1 tablet by mouth daily with lunch.    sevelamer carbonate (RENVELA) 800 MG tablet Take 2,400 mg by mouth 3 (three) times daily.   vitamin C (ASCORBIC ACID) 500 MG tablet Take 500 mg by mouth daily.   Vitamin E 400 units TABS Take 400 Units by mouth daily with lunch.   warfarin (COUMADIN) 5 MG tablet Take 1 tablet (5 mg total) by mouth daily at 4 PM. ONCE DAILY AS DIRECTED   famotidine (PEPCID) 10 MG tablet Take 1 tablet (10 mg total) by mouth 2 (two) times daily. (Patient not taking: Reported on 01/30/2021)   L-Tryptophan 500 MG CAPS Take 500 mg by mouth at bedtime. (Patient not taking: Reported on 01/30/2021)   lidocaine-prilocaine (EMLA) cream Apply 1 application topically as needed [facility]). (Patient not taking: Reported on 01/30/2021)   No facility-administered encounter medications on file as of 01/30/2021.  Allergies (verified) Sulfa antibiotics and Clonidine derivatives   History: Past Medical History:  Diagnosis Date   Advanced care planning/counseling discussion 06/10/2014   05/31/2014 patient presents copy of HCP and Living Will   Anemia    iron deficiency   Anxiety    Arthritis    "in hands"   Atrial fibrillation ([place])    Benign fundic gland polyps of stomach    Bladder polyps 06/25/2010   Chronic headaches [id number]/83/[id number]   Complication of anesthesia    "woke up at the end of a cyst removal surgery in 1991"   Depression 1991   hospitalized   ESRD (end stage renal disease) ([place]) 11/09/2015   HD on MWF   History  of chicken pox 06/25/2010   Hypercalcemia 02/18/2014   Hypertension    Insomnia 06/24/2010   Multiple chemical sensitivity syndrome 06/25/2010   Proteinuria 02/18/2014   Renal insufficiency 03/26/2011   Valvular heart disease 04/28/2016   Past Surgical History:  Procedure Laterality Date   AV FISTULA PLACEMENT Left    x4   COLONOSCOPY  2014   cyst on left breast removed Left 1991   benign   ESOPHAGOGASTRODUODENOSCOPY (EGD) WITH ESOPHAGEAL DILATION  2014   LAPAROSCOPIC BILATERAL SALPINGO OOPHERECTOMY Bilateral 08/20/2020   Procedure: LAPAROSCOPIC BILATERAL SALPINGO OOPHORECTOMY;  Surgeon: Megan Salon, MD;  Location: [place];  Service: Gynecology;  Laterality: Bilateral;   NASAL SEPTUM SURGERY  1986   rhinoplasty   polyps on bladder removed  1972   benign   TONSILLECTOMY  1962   Family History  Problem Relation Age of Onset   Breast cancer Mother 39       left breast removed, 2010 lung   Diabetes Mother    Nephrolithiasis Father    Heart attack Father 9       X 3   Heart disease Father        smoker   Hypertension Brother    Allergic rhinitis Brother    Melanoma Son 36       melanoma on leg removed   Pancreatic cancer Maternal Grandmother    Hypertension Paternal Grandmother    Obesity Paternal Grandmother    Heart attack Paternal Grandfather 53   Diabetes Maternal Aunt        x 2   Diabetes Maternal Uncle        x 3   Asthma Neg Hx    Eczema Neg Hx    Urticaria Neg Hx    Immunodeficiency Neg Hx    Angioedema Neg Hx    Social History   Socioeconomic History   Marital status: Married    Spouse name: Not on file   Number of children: 2   Years of education: Not on file   Highest education level: Not on file  Occupational History   Occupation: retired  Tobacco Use   Smoking status: Never   Smokeless tobacco: Never  Vaping Use   Vaping Use: Never used  Substance and Sexual Activity   Alcohol use: No   Drug use: No   Sexual activity: Yes    Partners:  Male  Other Topics Concern   Not on file  Social History Narrative   Married and lives with husband.     Retired   1 son and 1 daughter   No alcohol tobacco or caffeine   Social Determinants of Radio broadcast assistant Strain: Low Risk    Difficulty of Paying Living Expenses: Not hard at all  Food Insecurity: No Food Insecurity   Worried About Charity fundraiser in the Last Year: Never true   Ran Out of Food in the Last Year: Never true  Transportation Needs: No Transportation Needs   Lack of Transportation (Medical): No   Lack of Transportation (Non-Medical): No  Physical Activity: Insufficiently Active   Days of Exercise per Week: 7 days   Minutes of Exercise per Session: 20 min  Stress: No Stress Concern Present   Feeling of Stress : Not at all  Social Connections: Moderately Integrated   Frequency of Communication with Friends and Family: More than three times a week   Frequency of Social Gatherings with Friends and Family: More than three times a week   Attends Religious Services: More than 4 times per year   Active Member of [employer] or Organizations: No   Attends Music therapist: Never   Marital Status: Married    Tobacco Counseling Counseling given: Not Answered   Clinical Intake:  Pre-visit preparation completed: Yes  Pain : 0-10 Pain Score: 5  Pain Type: Acute pain Pain Location: Knee Pain Orientation: Right Pain Onset: 1 to 4 weeks ago Pain Frequency: Constant     BMI - recorded: 21.08 Nutritional Status: BMI of 19-24  Normal Nutritional Risks: None Diabetes: No  How often do you need to have someone help you when you read instructions, pamphlets, or other written materials from your doctor or pharmacy?: 1 - Never  Diabetic?No  Interpreter Needed?: No  Information entered by :: Caroleen Hamman LPN   Activities of Daily Living In your present state of health, do you have any difficulty performing the following activities:  01/30/2021 08/15/2020  Hearing? N Y  Comment - patient wears hearing aids  Vision? N N  Difficulty concentrating or making decisions? N N  Walking or climbing stairs? N N  Dressing or bathing? N N  Doing errands, shopping? N N  Preparing Food and eating ? N -  Using the Toilet? N -  In the past six months, have you accidently leaked urine? N -  Do you have problems with loss of bowel control? N -  Managing your Medications? N -  Managing your Finances? N -  Housekeeping or managing your Housekeeping? N -  Some recent data might be hidden    Patient Care Team: [name], MD as PCP - General (Family Medicine) Minus Breeding, MD as PCP - Cardiology (Cardiology) Minus Breeding, MD as Consulting Physician (Cardiology)  Indicate any recent Medical Services you may have received from other than [facility] providers in the past year (date may be approximate).     Assessment:   This is a routine wellness examination for Jameya.  Hearing/Vision screen Hearing Screening - Comments:: Bilateral hearing aids Vision Screening - Comments:: Last eye exam-03/2020-Dr. Sabra Heck  Dietary issues and exercise activities discussed: Current Exercise Habits: Home exercise routine, Type of exercise: walking, Time (Minutes): 15, Frequency (Times/Week): 7, Weekly Exercise (Minutes/Week): 105, Intensity: Mild, Exercise limited by: orthopedic condition(s) (knee pain)   Goals Addressed             This Visit's Progress    Patient Stated       Work on getting kidney transplant completed       Depression Screen PHQ 2/9 Scores 01/30/2021 10/03/2020 07/23/2020 12/07/2019 12/20/2017 12/17/2016 06/24/2015  PHQ - 2 Score 0 0 0 0 0 1 0    Fall Risk Fall Risk  01/30/2021 07/23/2020 08/15/2019 12/20/2017 12/17/2016  Falls in the past year? 0 0 0 1 Yes  Number falls in past yr: 0 0 - 0 1  Injury with Fall? 0 0 - 0 No  Risk Factor Category  - - - - -  Follow up Falls prevention discussed - - - Falls prevention  discussed;Education provided    FALL RISK PREVENTION PERTAINING TO THE HOME:  Any stairs in or around the home? Yes  If so, are there any without handrails? No  Home free of loose throw rugs in walkways, pet beds, electrical cords, etc? Yes  Adequate lighting in your home to reduce risk of falls? Yes   ASSISTIVE DEVICES UTILIZED TO PREVENT FALLS:  Life alert? No  Use of a cane, walker or w/c? No  Grab bars in the bathroom? No  Shower chair or bench in shower? Yes  Elevated toilet seat or a handicapped toilet? Yes   TIMED UP AND GO:  Was the test performed? Yes .  Length of time to ambulate 10 feet: 11 sec.   Gait slow and steady without use of assistive device  Cognitive Function:Normal cognitive status assessed by direct observation by this Nurse Health Advisor. No abnormalities found.   MMSE - Mini Mental State Exam 12/17/2016  Orientation to time 5  Orientation to Place 5  Registration 3  Attention/ Calculation 5  Recall 3  Language- name 2 objects 2  Language- repeat 1  Language- follow 3 step command 3  Language- read & follow direction 1  Write a sentence 1  Copy design 1  Total score 30        Immunizations Immunization History  Administered Date(s) Administered   Hepatitis A 02/20/2019   Hepatitis B 02/20/2019, 06/19/2019, 08/02/2019   Influenza, High Dose Seasonal PF 02/24/2018   Influenza-Unspecified 01/13/2019, 12/01/2019, 11/04/2020   Moderna Sars-Covid-2 Vaccination 03/29/2019, 04/26/2019, 10/06/2019   Pneumococcal Conjugate-13 01/20/2019   Tdap 06/10/2012   Zoster, Live 01/03/2012    TDAP status: Up to date  Flu Vaccine status: Up to date  Pneumococcal vaccine status: Up to date per patient-dates unknown  Covid-19 vaccine status: Information provided on how to obtain vaccines.   Qualifies for Shingles Vaccine? Yes   Zostavax completed Yes   Shingrix Completed?: No.    Education has been provided regarding the importance of this vaccine.  Patient has been advised to call insurance company to determine out of pocket expense if they have not yet received this vaccine. Advised may also receive vaccine at local pharmacy or Health Dept. Verbalized acceptance and understanding.  Screening Tests Health Maintenance  Topic Date Due   Zoster Vaccines- Shingrix (1 of 2) Never done   COVID-19 Vaccine (4 - Booster for Moderna series) 12/01/2019   Pneumonia Vaccine 25+ Years old (2 - PPSV23 if available, else PCV20) 01/20/2020   DEXA SCAN  03/19/2022   TETANUS/TDAP  06/11/2022   COLONOSCOPY (Pts 45-46yrs Insurance coverage will need to be confirmed)  08/05/2022   INFLUENZA VACCINE  Completed   Hepatitis C Screening  Completed   HPV VACCINES  Aged Out    Health Maintenance  Health Maintenance Due  Topic Date Due   Zoster Vaccines- Shingrix (1 of 2) Never done   COVID-19 Vaccine (4 - Booster for Moderna series) 12/01/2019   Pneumonia Vaccine 76+ Years old (2 - PPSV23 if available, else PCV20) 01/20/2020    Colorectal cancer screening: Type of screening: Colonoscopy. Completed 08/04/2012. Repeat every 10 years  Mammogram status: Scheduled for 02/2021  Bone  Density status: Completed 03/19/2020. Results reflect: Bone density results: OSTEOPOROSIS. Repeat every 2 years.  Lung Cancer Screening: (Low Dose CT Chest recommended if Age 49-80 years, 30 pack-year currently smoking OR have quit w/in 15years.) does not qualify.     Additional Screening:  Hepatitis C Screening: Completed 09/13/2015  Vision Screening: Recommended annual ophthalmology exams for early detection of glaucoma and other disorders of the eye. Is the patient up to date with their annual eye exam?  Yes  Who is the provider or what is the name of the office in which the patient attends annual eye exams? Dr. Sabra Heck  Dental Screening: Recommended annual dental exams for proper oral hygiene  Community Resource Referral / Chronic Care Management: CRR required this visit?   No   CCM required this visit?  No      Plan:     I have personally reviewed and noted the following in the patients chart:   Medical and social history Use of alcohol, tobacco or illicit drugs  Current medications and supplements including opioid prescriptions.  Functional ability and status Nutritional status Physical activity Advanced directives List of other physicians Hospitalizations, surgeries, and ER visits in previous 12 months Vitals Screenings to include cognitive, depression, and falls Referrals and appointments  In addition, I have reviewed and discussed with patient certain preventive protocols, quality metrics, and best practice recommendations. A written personalized care plan for preventive services as well as general preventive health recommendations were provided to patient.     Marta Antu, LPN   [id number]/84/[id number]  Nurse Health Advisor  Nurse Notes: Patient c/o knee pain x 2 weeks. Appt made to see provider on 02/04/21.

## 2021-01-29 DIAGNOSIS — N2581 Secondary hyperparathyroidism of renal origin: Secondary | ICD-10-CM | POA: Diagnosis not present

## 2021-01-29 DIAGNOSIS — N186 End stage renal disease: Secondary | ICD-10-CM | POA: Diagnosis not present

## 2021-01-29 DIAGNOSIS — D631 Anemia in chronic kidney disease: Secondary | ICD-10-CM | POA: Diagnosis not present

## 2021-01-30 ENCOUNTER — Ambulatory Visit (INDEPENDENT_AMBULATORY_CARE_PROVIDER_SITE_OTHER): Payer: Medicare HMO

## 2021-01-30 VITALS — BP 160/68 | HR 73 | Temp 98.2°F | Resp 16 | Ht 67.0 in | Wt 134.6 lb

## 2021-01-30 DIAGNOSIS — Z0000 Encounter for general adult medical examination without abnormal findings: Secondary | ICD-10-CM

## 2021-01-30 NOTE — Patient Instructions (Signed)
Jillian Hayes , Thank you for taking time to come for your Medicare Wellness Visit. I appreciate your ongoing commitment to your health goals. Please review the following plan we discussed and let me know if I can assist you in the future.   Screening recommendations/referrals: Colonoscopy: Completed 08/04/2012-Due 08/05/2022 Mammogram: Scheduled for 02/2021 Bone Density: Completed 03/19/2020-Due 03/19/2022 Recommended yearly ophthalmology/optometry visit for glaucoma screening and checkup Recommended yearly dental visit for hygiene and checkup  Vaccinations: Influenza vaccine: Up to date Pneumococcal vaccine: Up to date Tdap vaccine: Up to date Shingles vaccine: Discuss with pharmacy   Covid-19:Booster available at the pharmacy  Advanced directives: Copy in chart  Conditions/risks identified: Se problem list  Next appointment: Follow up in one year for your annual wellness visit    Preventive Care 31 Years and Older, Female Preventive care refers to lifestyle choices and visits with your health care provider that can promote health and wellness. What does preventive care include? A yearly physical exam. This is also called an annual well check. Dental exams once or twice a year. Routine eye exams. Ask your health care provider how often you should have your eyes checked. Personal lifestyle choices, including: Daily care of your teeth and gums. Regular physical activity. Eating a healthy diet. Avoiding tobacco and drug use. Limiting alcohol use. Practicing safe sex. Taking low-dose aspirin every day. Taking vitamin and mineral supplements as recommended by your health care provider. What happens during an annual well check? The services and screenings done by your health care provider during your annual well check will depend on your age, overall health, lifestyle risk factors, and family history of disease. Counseling  Your health care provider may ask you questions about  your: Alcohol use. Tobacco use. Drug use. Emotional well-being. Home and relationship well-being. Sexual activity. Eating habits. History of falls. Memory and ability to understand (cognition). Work and work Statistician. Reproductive health. Screening  You may have the following tests or measurements: Height, weight, and BMI. Blood pressure. Lipid and cholesterol levels. These may be checked every 5 years, or more frequently if you are over 40 years old. Skin check. Lung cancer screening. You may have this screening every year starting at age 28 if you have a 30-pack-year history of smoking and currently smoke or have quit within the past 15 years. Fecal occult blood test (FOBT) of the stool. You may have this test every year starting at age 27. Flexible sigmoidoscopy or colonoscopy. You may have a sigmoidoscopy every 5 years or a colonoscopy every 10 years starting at age 71. Hepatitis C blood test. Hepatitis B blood test. Sexually transmitted disease (STD) testing. Diabetes screening. This is done by checking your blood sugar (glucose) after you have not eaten for a while (fasting). You may have this done every 1-3 years. Bone density scan. This is done to screen for osteoporosis. You may have this done starting at age 6. Mammogram. This may be done every 1-2 years. Talk to your health care provider about how often you should have regular mammograms. Talk with your health care provider about your test results, treatment options, and if necessary, the need for more tests. Vaccines  Your health care provider may recommend certain vaccines, such as: Influenza vaccine. This is recommended every year. Tetanus, diphtheria, and acellular pertussis (Tdap, Td) vaccine. You may need a Td booster every 10 years. Zoster vaccine. You may need this after age 65. Pneumococcal 13-valent conjugate (PCV13) vaccine. One dose is recommended after age 58. Pneumococcal  polysaccharide (PPSV23) vaccine.  One dose is recommended after age 30. Talk to your health care provider about which screenings and vaccines you need and how often you need them. This information is not intended to replace advice given to you by your health care provider. Make sure you discuss any questions you have with your health care provider. Document Released: 02/15/2015 Document Revised: 10/09/2015 Document Reviewed: 11/20/2014 Elsevier Interactive Patient Education  2017 [place] Prevention in the Home Falls can cause injuries. They can happen to people of all ages. There are many things you can do to make your home safe and to help prevent falls. What can I do on the outside of my home? Regularly fix the edges of walkways and driveways and fix any cracks. Remove anything that might make you trip as you walk through a door, such as a raised step or threshold. Trim any bushes or trees on the path to your home. Use bright outdoor lighting. Clear any walking paths of anything that might make someone trip, such as rocks or tools. Regularly check to see if handrails are loose or broken. Make sure that both sides of any steps have handrails. Any raised decks and porches should have guardrails on the edges. Have any leaves, snow, or ice cleared regularly. Use sand or salt on walking paths during winter. Clean up any spills in your garage right away. This includes oil or grease spills. What can I do in the bathroom? Use night lights. Install grab bars by the toilet and in the tub and shower. Do not use towel bars as grab bars. Use non-skid mats or decals in the tub or shower. If you need to sit down in the shower, use a plastic, non-slip stool. Keep the floor dry. Clean up any water that spills on the floor as soon as it happens. Remove soap buildup in the tub or shower regularly. Attach bath mats securely with double-sided non-slip rug tape. Do not have throw rugs and other things on the floor that can make  you trip. What can I do in the bedroom? Use night lights. Make sure that you have a light by your bed that is easy to reach. Do not use any sheets or blankets that are too big for your bed. They should not hang down onto the floor. Have a firm chair that has side arms. You can use this for support while you get dressed. Do not have throw rugs and other things on the floor that can make you trip. What can I do in the kitchen? Clean up any spills right away. Avoid walking on wet floors. Keep items that you use a lot in easy-to-reach places. If you need to reach something above you, use a strong step stool that has a grab bar. Keep electrical cords out of the way. Do not use floor polish or wax that makes floors slippery. If you must use wax, use non-skid floor wax. Do not have throw rugs and other things on the floor that can make you trip. What can I do with my stairs? Do not leave any items on the stairs. Make sure that there are handrails on both sides of the stairs and use them. Fix handrails that are broken or loose. Make sure that handrails are as long as the stairways. Check any carpeting to make sure that it is firmly attached to the stairs. Fix any carpet that is loose or worn. Avoid having throw rugs at the top  or bottom of the stairs. If you do have throw rugs, attach them to the floor with carpet tape. Make sure that you have a light switch at the top of the stairs and the bottom of the stairs. If you do not have them, ask someone to add them for you. What else can I do to help prevent falls? Wear shoes that: Do not have high heels. Have rubber bottoms. Are comfortable and fit you well. Are closed at the toe. Do not wear sandals. If you use a stepladder: Make sure that it is fully opened. Do not climb a closed stepladder. Make sure that both sides of the stepladder are locked into place. Ask someone to hold it for you, if possible. Clearly mark and make sure that you can  see: Any grab bars or handrails. First and last steps. Where the edge of each step is. Use tools that help you move around (mobility aids) if they are needed. These include: Canes. Walkers. Scooters. Crutches. Turn on the lights when you go into a dark area. Replace any light bulbs as soon as they burn out. Set up your furniture so you have a clear path. Avoid moving your furniture around. If any of your floors are uneven, fix them. If there are any pets around you, be aware of where they are. Review your medicines with your doctor. Some medicines can make you feel dizzy. This can increase your chance of falling. Ask your doctor what other things that you can do to help prevent falls. This information is not intended to replace advice given to you by your health care provider. Make sure you discuss any questions you have with your health care provider. Document Released: 11/15/2008 Document Revised: 06/27/2015 Document Reviewed: 02/23/2014 Elsevier Interactive Patient Education  2017 [employer].

## 2021-01-31 DIAGNOSIS — N2581 Secondary hyperparathyroidism of renal origin: Secondary | ICD-10-CM | POA: Diagnosis not present

## 2021-01-31 DIAGNOSIS — N186 End stage renal disease: Secondary | ICD-10-CM | POA: Diagnosis not present

## 2021-01-31 DIAGNOSIS — D631 Anemia in chronic kidney disease: Secondary | ICD-10-CM | POA: Diagnosis not present

## 2021-02-01 DIAGNOSIS — Z992 Dependence on renal dialysis: Secondary | ICD-10-CM | POA: Diagnosis not present

## 2021-02-01 DIAGNOSIS — N186 End stage renal disease: Secondary | ICD-10-CM | POA: Diagnosis not present

## 2021-02-03 DIAGNOSIS — D631 Anemia in chronic kidney disease: Secondary | ICD-10-CM | POA: Diagnosis not present

## 2021-02-03 DIAGNOSIS — Z7901 Long term (current) use of anticoagulants: Secondary | ICD-10-CM | POA: Diagnosis not present

## 2021-02-03 DIAGNOSIS — I10 Essential (primary) hypertension: Secondary | ICD-10-CM | POA: Diagnosis not present

## 2021-02-03 DIAGNOSIS — N2581 Secondary hyperparathyroidism of renal origin: Secondary | ICD-10-CM | POA: Diagnosis not present

## 2021-02-03 DIAGNOSIS — N186 End stage renal disease: Secondary | ICD-10-CM | POA: Diagnosis not present

## 2021-02-04 ENCOUNTER — Ambulatory Visit (INDEPENDENT_AMBULATORY_CARE_PROVIDER_SITE_OTHER): Payer: Medicare HMO | Admitting: Medical

## 2021-02-04 ENCOUNTER — Ambulatory Visit (HOSPITAL_BASED_OUTPATIENT_CLINIC_OR_DEPARTMENT_OTHER)
Admission: RE | Admit: 2021-02-04 | Discharge: 2021-02-04 | Disposition: A | Discharge status: Home / Self Care | Payer: Medicare HMO | Source: Ambulatory Visit | Attending: Medical | Admitting: Medical

## 2021-02-04 ENCOUNTER — Encounter: Payer: Self-pay | Admitting: Medical

## 2021-02-04 ENCOUNTER — Other Ambulatory Visit: Payer: Self-pay | Admitting: Medical

## 2021-02-04 ENCOUNTER — Other Ambulatory Visit: Payer: Self-pay

## 2021-02-04 VITALS — BP 140/70 | HR 75 | Temp 98.2°F | Resp 18 | Ht 66.0 in | Wt 134.4 lb

## 2021-02-04 DIAGNOSIS — H9201 Otalgia, right ear: Secondary | ICD-10-CM

## 2021-02-04 DIAGNOSIS — I10 Essential (primary) hypertension: Secondary | ICD-10-CM

## 2021-02-04 DIAGNOSIS — M25561 Pain in right knee: Secondary | ICD-10-CM

## 2021-02-04 DIAGNOSIS — M79661 Pain in right lower leg: Secondary | ICD-10-CM

## 2021-02-04 MED ORDER — FLUTICASONE PROPIONATE 50 MCG/ACT NA SUSP: 2.0000 | Freq: Every day | NASAL | 1 refills | Status: DC

## 2021-02-04 NOTE — Patient Instructions (Signed)
Rt knee pain and calf pain since christmas.  Will get xray of knee and rt [place] lower ext today at 4:30/  Get cbc and uric acid today.  Can continue tylenol. Change in tx to be determined after lab and imaging review.  Might refer to sports med or ortho.  For rt ear pressure rx flonase for possible eustachian tube pressure. If pain worsens or changes let me know.   Follow up date to be determined after study review.

## 2021-02-04 NOTE — Progress Notes (Signed)
Subjective:    Patient ID: Jillian Hayes, female    DOB: 26-Oct-1945, 76 y.o.   MRN: [id number]  HPI  Pt has been having some knee pain since around christmas. Pain has been in front and behind the knee. Pt has noted clicking when she walks. She states no locking sensation. Pain started in the knee and now shooting down toward her foot. Pain was worse with cold weather.   Pt states no prior knee pain. But did hear click to her knee in past for several years.   Pt is taking tylenol for pain. Pt pain level at best know is 3/10.  Pt had no recent falls. She does have a walker at home. She will use if needed.  Rt ear pain intermittent sharp and stabbing.  Review of Systems  Constitutional:  Negative for chills, fatigue and fever.  Respiratory:  Negative for cough, chest tightness, shortness of breath and wheezing.   Cardiovascular:  Negative for chest pain and palpitations.  Gastrointestinal:  Negative for abdominal pain, constipation and diarrhea.  Musculoskeletal:        Rt knee pain and calf pain  Skin:        Rt knee warmth.  Neurological:  Negative for dizziness and numbness.  Hematological:  Negative for adenopathy. Does not bruise/bleed easily.    Past Medical History:  Diagnosis Date   Advanced care planning/counseling discussion 06/10/2014   05/31/2014 patient presents copy of HCP and Living Will   Anemia    iron deficiency   Anxiety    Arthritis    "in hands"   Atrial fibrillation (HCC)    Benign fundic gland polyps of stomach    Bladder polyps 06/25/2010   Chronic headaches 27/51/7001   Complication of anesthesia    "woke up at the end of a cyst removal surgery in 1991"   Depression 1991   hospitalized   ESRD (end stage renal disease) ([place]) 11/09/2015   HD on MWF   History of chicken pox 06/25/2010   Hypercalcemia 02/18/2014   Hypertension    Insomnia 06/24/2010   Multiple chemical sensitivity syndrome 06/25/2010   Proteinuria 02/18/2014   Renal  insufficiency 03/26/2011   Valvular heart disease 04/28/2016     Social History   Socioeconomic History   Marital status: Married    Spouse name: Not on file   Number of children: 2   Years of education: Not on file   Highest education level: Not on file  Occupational History   Occupation: retired  Tobacco Use   Smoking status: Never   Smokeless tobacco: Never  Vaping Use   Vaping Use: Never used  Substance and Sexual Activity   Alcohol use: No   Drug use: No   Sexual activity: Yes    Partners: Male  Other Topics Concern   Not on file  Social History Narrative   Married and lives with husband.     Retired   1 son and 1 daughter   No alcohol tobacco or caffeine   Social Determinants of Radio broadcast assistant Strain: Low Risk    Difficulty of Paying Living Expenses: Not hard at all  Food Insecurity: No Food Insecurity   Worried About Charity fundraiser in the Last Year: Never true   Arboriculturist in the Last Year: Never true  Transportation Needs: No Transportation Needs   Lack of Transportation (Medical): No   Lack of Transportation (Non-Medical): No  Physical Activity:  Insufficiently Active   Days of Exercise per Week: 7 days   Minutes of Exercise per Session: 20 min  Stress: No Stress Concern Present   Feeling of Stress : Not at all  Social Connections: Moderately Integrated   Frequency of Communication with Friends and Family: More than three times a week   Frequency of Social Gatherings with Friends and Family: More than three times a week   Attends Religious Services: More than 4 times per year   Active Member of Clubs or Organizations: No   Attends Archivist Meetings: Never   Marital Status: Married  Human resources officer Violence: Not At Risk   Fear of Current or Ex-Partner: No   Emotionally Abused: No   Physically Abused: No   Sexually Abused: No    Past Surgical History:  Procedure Laterality Date   AV FISTULA PLACEMENT Left    x4    COLONOSCOPY  2014   cyst on left breast removed Left 1991   benign   ESOPHAGOGASTRODUODENOSCOPY (EGD) WITH ESOPHAGEAL DILATION  2014   LAPAROSCOPIC BILATERAL SALPINGO OOPHERECTOMY Bilateral 08/20/2020   Procedure: LAPAROSCOPIC BILATERAL SALPINGO OOPHORECTOMY;  Surgeon: Megan Salon, MD;  Location: [facility];  Service: Gynecology;  Laterality: Bilateral;   NASAL SEPTUM SURGERY  1986   rhinoplasty   polyps on bladder removed  1972   benign   TONSILLECTOMY  1962    Family History  Problem Relation Age of Onset   Breast cancer Mother 23       left breast removed, 2010 lung   Diabetes Mother    Nephrolithiasis Father    Heart attack Father 22       X 3   Heart disease Father        smoker   Hypertension Brother    Allergic rhinitis Brother    Melanoma Son 82       melanoma on leg removed   Pancreatic cancer Maternal Grandmother    Hypertension Paternal Grandmother    Obesity Paternal Grandmother    Heart attack Paternal Grandfather 19   Diabetes Maternal Aunt        x 2   Diabetes Maternal Uncle        x 3   Asthma Neg Hx    Eczema Neg Hx    Urticaria Neg Hx    Immunodeficiency Neg Hx    Angioedema Neg Hx     Allergies  Allergen Reactions   Sulfa Antibiotics Nausea And Vomiting   Clonidine Derivatives Palpitations    Current Outpatient Medications on File Prior to Visit  Medication Sig Dispense Refill   acetaminophen (TYLENOL) 500 MG tablet Take 500 mg by mouth every 6 (six) hours as needed for headache (pain).     Alfalfa 500 MG TABS Take 500 mg by mouth 3 (three) times daily.     atorvastatin (LIPITOR) 20 MG tablet Take 20 mg by mouth at bedtime.     carvedilol (COREG) 25 MG tablet Take 1 tablet by mouth twice daily 180 tablet 3   cinacalcet (SENSIPAR) 30 MG tablet Take 30 mg by mouth at bedtime.     diltiazem (CARDIZEM CD) 240 MG 24 hr capsule Take 240 mg by mouth daily at 12 noon.     famotidine (PEPCID) 10 MG tablet Take 1 tablet (10 mg total) by mouth 2 (two)  times daily. 30 tablet 1   Flaxseed, Linseed, (FLAX SEED OIL) 1000 MG CAPS Take 1,000 mg by mouth daily.  hydrALAZINE (APRESOLINE) 50 MG tablet Take 50 mg by mouth 2 (two) times daily.     L-Glutamine 500 MG CAPS Take 500 mg by mouth daily after supper.     L-Tryptophan 500 MG CAPS Take 500 mg by mouth at bedtime.     lidocaine-prilocaine (EMLA) cream Apply 1 application topically as needed [facility]).     multivitamin (RENA-VIT) TABS tablet Take 1 tablet by mouth daily.     OVER THE COUNTER MEDICATION Take 1 capsule by mouth daily. Herblax     Probiotic Product (PROBIOTIC DAILY PO) Take 1 tablet by mouth daily with lunch.      sevelamer carbonate (RENVELA) 800 MG tablet Take 2,400 mg by mouth 3 (three) times daily.     vitamin C (ASCORBIC ACID) 500 MG tablet Take 500 mg by mouth daily.     Vitamin E 400 units TABS Take 400 Units by mouth daily with lunch.     warfarin (COUMADIN) 5 MG tablet Take 1 tablet (5 mg total) by mouth daily at 4 PM. ONCE DAILY AS DIRECTED 30 tablet 0   No current facility-administered medications on file prior to visit.    BP (!) 158/80 (BP Location: Right Arm, Patient Position: Sitting, Cuff Size: Normal)    Pulse 75    Temp 98.2 F (36.8 C) (Oral)    Resp 18    Ht 5\' 6"  (1.676 m)    Wt 134 lb 6.4 oz (61 kg)    SpO2 97%    BMI 21.69 kg/m       Objective:   Physical Exam  General Mental Status- Alert. General Appearance- Not in acute distress.   Skin General: Color- Normal Color. Moisture- Normal Moisture.  Neck Carotid Arteries- Normal color. Moisture- Normal Moisture. No carotid bruits. No JVD.  Chest and Lung Exam Auscultation: Breath Sounds:-Normal.  Cardiovascular Auscultation:Rythm- Regular. Murmurs & Other Heart Sounds:Auscultation of the heart reveals- No Murmurs.  Abdomen Inspection:-Inspeection Normal. Palpation/Percussion:Note:No mass. Palpation and Percussion of the abdomen reveal- Non Tender, Non Distended + BS, no rebound or  guarding.  Neurologic Cranial Nerve exam:- CN III-XII intact(No nystagmus), symmetric smile. Strength:- 5/5 equal and symmetric strength both upper and lower extremities.   Rt knee- mild-moderate swollen, warm and tender. Negative homans sign.  Rt calf- mild tender.   Heent- rt ear canal clear and normal tm. Left ear- canal clear and tube in place.    Assessment & Plan:   Patient Instructions  Rt knee pain and calf pain since christmas.  Will get xray of knee and rt [place] lower ext today at 4:30/  Get cbc and uric acid today.  Can continue tylenol. Change in tx to be determined after lab and imaging review.  Might refer to sports med or ortho.  For rt ear pressure rx flonase for possible eustachian tube pressure. If pain worsens or changes let me know.   Follow up date to be determined after study review.   Mackie Pai, PA-C

## 2021-02-05 DIAGNOSIS — N186 End stage renal disease: Secondary | ICD-10-CM | POA: Diagnosis not present

## 2021-02-05 DIAGNOSIS — N2581 Secondary hyperparathyroidism of renal origin: Secondary | ICD-10-CM | POA: Diagnosis not present

## 2021-02-05 DIAGNOSIS — D631 Anemia in chronic kidney disease: Secondary | ICD-10-CM | POA: Diagnosis not present

## 2021-02-05 DIAGNOSIS — I10 Essential (primary) hypertension: Secondary | ICD-10-CM | POA: Diagnosis not present

## 2021-02-05 LAB — CBC WITH DIFFERENTIAL/PLATELET
Basophils Absolute: 0 10*3/uL (ref 0.0–0.1)
Basophils Relative: 0.3 % (ref 0.0–3.0)
Eosinophils Absolute: 0 10*3/uL (ref 0.0–0.7)
Eosinophils Relative: 0.3 % (ref 0.0–5.0)
HCT: 33.7 % — ABNORMAL LOW (ref 36.0–46.0)
Hemoglobin: 11 g/dL — ABNORMAL LOW (ref 12.0–15.0)
Lymphocytes Relative: 32.7 % (ref 12.0–46.0)
Lymphs Abs: 1.4 10*3/uL (ref 0.7–4.0)
MCHC: 32.8 g/dL (ref 30.0–36.0)
MCV: 95.2 fl (ref 78.0–100.0)
Monocytes Absolute: 0.5 10*3/uL (ref 0.1–1.0)
Monocytes Relative: 11.5 % (ref 3.0–12.0)
Neutro Abs: 2.4 10*3/uL (ref 1.4–7.7)
Neutrophils Relative %: 55.2 % (ref 43.0–77.0)
Platelets: 165 10*3/uL (ref 150.0–400.0)
RBC: 3.54 Mil/uL — ABNORMAL LOW (ref 3.87–5.11)
RDW: 15.2 % (ref 11.5–15.5)
WBC: 4.3 10*3/uL (ref 4.0–10.5)

## 2021-02-05 LAB — URIC ACID: Uric Acid, Serum: 3.6 mg/dL (ref 2.4–7.0)

## 2021-02-05 NOTE — Addendum Note (Signed)
Addended by: Anabel Halon on: 02/05/2021 07:15 PM   Modules accepted: Orders

## 2021-02-07 DIAGNOSIS — I10 Essential (primary) hypertension: Secondary | ICD-10-CM | POA: Diagnosis not present

## 2021-02-07 DIAGNOSIS — N186 End stage renal disease: Secondary | ICD-10-CM | POA: Diagnosis not present

## 2021-02-07 DIAGNOSIS — D631 Anemia in chronic kidney disease: Secondary | ICD-10-CM | POA: Diagnosis not present

## 2021-02-07 DIAGNOSIS — N2581 Secondary hyperparathyroidism of renal origin: Secondary | ICD-10-CM | POA: Diagnosis not present

## 2021-02-10 DIAGNOSIS — D631 Anemia in chronic kidney disease: Secondary | ICD-10-CM | POA: Diagnosis not present

## 2021-02-10 DIAGNOSIS — Z7901 Long term (current) use of anticoagulants: Secondary | ICD-10-CM | POA: Diagnosis not present

## 2021-02-10 DIAGNOSIS — N2581 Secondary hyperparathyroidism of renal origin: Secondary | ICD-10-CM | POA: Diagnosis not present

## 2021-02-10 DIAGNOSIS — N186 End stage renal disease: Secondary | ICD-10-CM | POA: Diagnosis not present

## 2021-02-10 DIAGNOSIS — I10 Essential (primary) hypertension: Secondary | ICD-10-CM | POA: Diagnosis not present

## 2021-02-12 DIAGNOSIS — I10 Essential (primary) hypertension: Secondary | ICD-10-CM | POA: Diagnosis not present

## 2021-02-12 DIAGNOSIS — N2581 Secondary hyperparathyroidism of renal origin: Secondary | ICD-10-CM | POA: Diagnosis not present

## 2021-02-12 DIAGNOSIS — D631 Anemia in chronic kidney disease: Secondary | ICD-10-CM | POA: Diagnosis not present

## 2021-02-12 DIAGNOSIS — N186 End stage renal disease: Secondary | ICD-10-CM | POA: Diagnosis not present

## 2021-02-13 DIAGNOSIS — Z1231 Encounter for screening mammogram for malignant neoplasm of breast: Secondary | ICD-10-CM | POA: Diagnosis not present

## 2021-02-13 LAB — HM MAMMOGRAPHY

## 2021-02-14 DIAGNOSIS — I10 Essential (primary) hypertension: Secondary | ICD-10-CM | POA: Diagnosis not present

## 2021-02-14 DIAGNOSIS — N186 End stage renal disease: Secondary | ICD-10-CM | POA: Diagnosis not present

## 2021-02-14 DIAGNOSIS — D631 Anemia in chronic kidney disease: Secondary | ICD-10-CM | POA: Diagnosis not present

## 2021-02-14 DIAGNOSIS — N2581 Secondary hyperparathyroidism of renal origin: Secondary | ICD-10-CM | POA: Diagnosis not present

## 2021-02-17 ENCOUNTER — Encounter: Payer: Self-pay | Admitting: Family Medicine

## 2021-02-17 DIAGNOSIS — Z7901 Long term (current) use of anticoagulants: Secondary | ICD-10-CM | POA: Diagnosis not present

## 2021-02-17 DIAGNOSIS — I10 Essential (primary) hypertension: Secondary | ICD-10-CM | POA: Diagnosis not present

## 2021-02-17 DIAGNOSIS — N186 End stage renal disease: Secondary | ICD-10-CM | POA: Diagnosis not present

## 2021-02-17 DIAGNOSIS — D631 Anemia in chronic kidney disease: Secondary | ICD-10-CM | POA: Diagnosis not present

## 2021-02-17 DIAGNOSIS — N2581 Secondary hyperparathyroidism of renal origin: Secondary | ICD-10-CM | POA: Diagnosis not present

## 2021-02-19 DIAGNOSIS — D631 Anemia in chronic kidney disease: Secondary | ICD-10-CM | POA: Diagnosis not present

## 2021-02-19 DIAGNOSIS — N186 End stage renal disease: Secondary | ICD-10-CM | POA: Diagnosis not present

## 2021-02-19 DIAGNOSIS — I10 Essential (primary) hypertension: Secondary | ICD-10-CM | POA: Diagnosis not present

## 2021-02-19 DIAGNOSIS — N2581 Secondary hyperparathyroidism of renal origin: Secondary | ICD-10-CM | POA: Diagnosis not present

## 2021-02-21 DIAGNOSIS — N2581 Secondary hyperparathyroidism of renal origin: Secondary | ICD-10-CM | POA: Diagnosis not present

## 2021-02-21 DIAGNOSIS — I10 Essential (primary) hypertension: Secondary | ICD-10-CM | POA: Diagnosis not present

## 2021-02-21 DIAGNOSIS — D631 Anemia in chronic kidney disease: Secondary | ICD-10-CM | POA: Diagnosis not present

## 2021-02-21 DIAGNOSIS — N186 End stage renal disease: Secondary | ICD-10-CM | POA: Diagnosis not present

## 2021-02-24 DIAGNOSIS — I10 Essential (primary) hypertension: Secondary | ICD-10-CM | POA: Diagnosis not present

## 2021-02-24 DIAGNOSIS — Z7901 Long term (current) use of anticoagulants: Secondary | ICD-10-CM | POA: Diagnosis not present

## 2021-02-24 DIAGNOSIS — N186 End stage renal disease: Secondary | ICD-10-CM | POA: Diagnosis not present

## 2021-02-24 DIAGNOSIS — N2581 Secondary hyperparathyroidism of renal origin: Secondary | ICD-10-CM | POA: Diagnosis not present

## 2021-02-24 DIAGNOSIS — D631 Anemia in chronic kidney disease: Secondary | ICD-10-CM | POA: Diagnosis not present

## 2021-02-25 ENCOUNTER — Ambulatory Visit: Payer: Self-pay

## 2021-02-25 ENCOUNTER — Ambulatory Visit: Payer: Medicare HMO | Admitting: Family Medicine

## 2021-02-25 ENCOUNTER — Ambulatory Visit: Payer: Medicare HMO | Admitting: Internal Medicine

## 2021-02-25 ENCOUNTER — Encounter: Payer: Self-pay | Admitting: Family Medicine

## 2021-02-25 VITALS — BP 170/68 | Ht 66.0 in | Wt 134.0 lb

## 2021-02-25 DIAGNOSIS — M1711 Unilateral primary osteoarthritis, right knee: Secondary | ICD-10-CM | POA: Insufficient documentation

## 2021-02-25 DIAGNOSIS — M179 Osteoarthritis of knee, unspecified: Secondary | ICD-10-CM | POA: Insufficient documentation

## 2021-02-25 DIAGNOSIS — M23203 Derangement of unspecified medial meniscus due to old tear or injury, right knee: Secondary | ICD-10-CM | POA: Diagnosis not present

## 2021-02-25 DIAGNOSIS — M25561 Pain in right knee: Secondary | ICD-10-CM

## 2021-02-25 MED ORDER — TRIAMCINOLONE ACETONIDE 40 MG/ML IJ SUSP: 40.0000 mg | Freq: Once | INTRAMUSCULAR | Status: DC

## 2021-02-25 NOTE — Progress Notes (Signed)
Jillian Hayes - 76 y.o. female MRN [id number]  Date of birth: 12-03-1945  SUBJECTIVE:  Including CC & ROS.  No chief complaint on file.   Jillian Hayes is a 76 y.o. female that is presenting with acute worsening of right knee pain.  The pain is occurring over the medial aspect.  Has a distant history of injury.  No history of surgery.  Review of note from 1/30 shows she was counseled on taking Tylenol. Review of the lower extremity duplex from 1/3 shows no DVTs. Independent review of the right knee x-ray shows no acute changes.  Review of Systems See HPI   HISTORY: Past Medical, Surgical, Social, and Family History Reviewed & Updated per EMR.   Pertinent Historical Findings include:  Past Medical History:  Diagnosis Date   Advanced care planning/counseling discussion 06/10/2014   05/31/2014 patient presents copy of HCP and Living Will   Anemia    iron deficiency   Anxiety    Arthritis    "in hands"   Atrial fibrillation ([place])    Benign fundic gland polyps of stomach    Bladder polyps 06/25/2010   Chronic headaches 67/89/3810   Complication of anesthesia    "woke up at the end of a cyst removal surgery in 1991"   Depression 1991   hospitalized   Diverticulosis    ESRD (end stage renal disease) ([place]) 11/09/2015   HD on MWF   External prolapsed hemorrhoids    History of chicken pox 06/25/2010   Hypercalcemia 02/18/2014   Hypertension    Insomnia 06/24/2010   Multiple chemical sensitivity syndrome 06/25/2010   Proteinuria 02/18/2014   Renal insufficiency 03/26/2011   Valvular heart disease 04/28/2016    Past Surgical History:  Procedure Laterality Date   AV FISTULA PLACEMENT Left    x4   COLONOSCOPY  2014   cyst on left breast removed Left 1991   benign   ESOPHAGOGASTRODUODENOSCOPY (EGD) WITH ESOPHAGEAL DILATION  2014   LAPAROSCOPIC BILATERAL SALPINGO OOPHERECTOMY Bilateral 08/20/2020   Procedure: LAPAROSCOPIC BILATERAL SALPINGO OOPHORECTOMY;  Surgeon: Megan Salon, MD;  Location: [facility];  Service: Gynecology;  Laterality: Bilateral;   NASAL SEPTUM SURGERY  1986   rhinoplasty   polyps on bladder removed  1972   benign   TONSILLECTOMY  1962     PHYSICAL EXAM:  VS: BP (!) 170/68 (BP Location: Right Arm, Patient Position: Sitting)    Ht 5\' 6"  (1.676 m)    Wt 134 lb (60.8 kg)    BMI 21.63 kg/m  Physical Exam Gen: NAD, alert, cooperative with exam, well-appearing MSK: Neurovascularly intact    Limited ultrasound: Right knee:  Moderate effusion suprapatellar pouch. Normal-appearing quadricep and patellar tendon. Normal knee medially joint space but degenerative changes with increased hyperemia over the medial meniscus. Degenerative changes of the lateral meniscus.  Summary: Effusion with degenerative meniscal tear medially  Ultrasound and interpretation by Clearance Coots, MD   Aspiration/Injection Procedure Note Jillian Hayes 19-Nov-1945  Procedure: Aspiration and Injection Indications: Right knee pain  Procedure Details Consent: Risks of procedure as well as the alternatives and risks of each were explained to the (patient/caregiver).  Consent for procedure obtained. Time Out: Verified patient identification, verified procedure, site/side was marked, verified correct patient position, special equipment/implants available, medications/allergies/relevent history reviewed, required imaging and test results available.  Performed.  The area was cleaned with iodine and alcohol swabs.    The right knee superior lateral suprapatellar pouch was injected using 3 cc  of 1% lidocaine on a 25-gauge 1-1/2 inch needle.  An 18-gauge 1-1/2 inch needle was used to achieve aspiration.  The syringe was switched and a mixture containing 1 cc's of 40 mg Kenalog and 4 cc's of 0.25% bupivacaine was injected.  Ultrasound was used. Images were obtained in long views showing the injection.    Amount of Fluid Aspirated:  47mL Character of Fluid: clear and red  colored Fluid was sent for: n/a  A sterile dressing was applied.  Patient did tolerate procedure well.    ASSESSMENT & PLAN:   Degenerative tear of medial meniscus of right knee Acutely occurring.  Symptoms associated with the degenerative changes of the meniscus. -Counseled on home exercise therapy and supportive care. -Aspiration injection today. -Could consider PRP or physical therapy.

## 2021-02-25 NOTE — Addendum Note (Signed)
Addended by: Cresenciano Lick on: 02/25/2021 04:07 PM   Modules accepted: Orders

## 2021-02-25 NOTE — Patient Instructions (Signed)
Nice to meet you Please try ice  Please try the exercises  Please compression   Please send me a message in MyChart with any questions or updates.  Please see me back in 4 weeks.   --Dr. Raeford Razor

## 2021-02-25 NOTE — Assessment & Plan Note (Signed)
Acutely occurring.  Symptoms associated with the degenerative changes of the meniscus. -Counseled on home exercise therapy and supportive care. -Aspiration injection today. -Could consider PRP or physical therapy.

## 2021-02-26 DIAGNOSIS — I10 Essential (primary) hypertension: Secondary | ICD-10-CM | POA: Diagnosis not present

## 2021-02-26 DIAGNOSIS — Z114 Encounter for screening for human immunodeficiency virus [HIV]: Secondary | ICD-10-CM | POA: Diagnosis not present

## 2021-02-26 DIAGNOSIS — Z1159 Encounter for screening for other viral diseases: Secondary | ICD-10-CM | POA: Diagnosis not present

## 2021-02-26 DIAGNOSIS — N2581 Secondary hyperparathyroidism of renal origin: Secondary | ICD-10-CM | POA: Diagnosis not present

## 2021-02-26 DIAGNOSIS — D631 Anemia in chronic kidney disease: Secondary | ICD-10-CM | POA: Diagnosis not present

## 2021-02-26 DIAGNOSIS — N186 End stage renal disease: Secondary | ICD-10-CM | POA: Diagnosis not present

## 2021-02-26 DIAGNOSIS — R69 Illness, unspecified: Secondary | ICD-10-CM | POA: Diagnosis not present

## 2021-02-28 DIAGNOSIS — D631 Anemia in chronic kidney disease: Secondary | ICD-10-CM | POA: Diagnosis not present

## 2021-02-28 DIAGNOSIS — I10 Essential (primary) hypertension: Secondary | ICD-10-CM | POA: Diagnosis not present

## 2021-02-28 DIAGNOSIS — N186 End stage renal disease: Secondary | ICD-10-CM | POA: Diagnosis not present

## 2021-02-28 DIAGNOSIS — N2581 Secondary hyperparathyroidism of renal origin: Secondary | ICD-10-CM | POA: Diagnosis not present

## 2021-03-03 ENCOUNTER — Telehealth: Payer: Self-pay

## 2021-03-03 ENCOUNTER — Other Ambulatory Visit: Payer: Medicare HMO

## 2021-03-03 ENCOUNTER — Other Ambulatory Visit: Payer: Self-pay

## 2021-03-03 DIAGNOSIS — D631 Anemia in chronic kidney disease: Secondary | ICD-10-CM | POA: Diagnosis not present

## 2021-03-03 DIAGNOSIS — N186 End stage renal disease: Secondary | ICD-10-CM | POA: Diagnosis not present

## 2021-03-03 DIAGNOSIS — I10 Essential (primary) hypertension: Secondary | ICD-10-CM | POA: Diagnosis not present

## 2021-03-03 DIAGNOSIS — Z7901 Long term (current) use of anticoagulants: Secondary | ICD-10-CM | POA: Diagnosis not present

## 2021-03-03 DIAGNOSIS — N2581 Secondary hyperparathyroidism of renal origin: Secondary | ICD-10-CM | POA: Diagnosis not present

## 2021-03-03 DIAGNOSIS — R300 Dysuria: Secondary | ICD-10-CM

## 2021-03-03 NOTE — Addendum Note (Signed)
Addended by: Randolm Idol A on: 03/03/2021 08:53 AM   Modules accepted: Orders

## 2021-03-03 NOTE — Telephone Encounter (Signed)
Caller Name [facility] Phone Number [phone] Patient Name Jillian Hayes Patient DOB 08/04/2045 Call Type Message Only Information Provided Reason for Call Request for General Office Information Initial Comment Needs to talk to the Drs nurse, starting a UTI, pls call back Disp. Time Disposition Final User 03/03/2021 7:50:36 AM General Information Provided Yes Donato Heinz Call Closed By: Donato Heinz Transaction Date/Time: 03/03/2021 7:48:26 AM (ET)

## 2021-03-04 DIAGNOSIS — Z992 Dependence on renal dialysis: Secondary | ICD-10-CM | POA: Diagnosis not present

## 2021-03-04 DIAGNOSIS — N186 End stage renal disease: Secondary | ICD-10-CM | POA: Diagnosis not present

## 2021-03-05 ENCOUNTER — Other Ambulatory Visit (INDEPENDENT_AMBULATORY_CARE_PROVIDER_SITE_OTHER): Payer: Medicare HMO

## 2021-03-05 DIAGNOSIS — R300 Dysuria: Secondary | ICD-10-CM

## 2021-03-05 DIAGNOSIS — N2581 Secondary hyperparathyroidism of renal origin: Secondary | ICD-10-CM | POA: Diagnosis not present

## 2021-03-05 DIAGNOSIS — N186 End stage renal disease: Secondary | ICD-10-CM | POA: Diagnosis not present

## 2021-03-05 DIAGNOSIS — D509 Iron deficiency anemia, unspecified: Secondary | ICD-10-CM | POA: Diagnosis not present

## 2021-03-05 DIAGNOSIS — D631 Anemia in chronic kidney disease: Secondary | ICD-10-CM | POA: Diagnosis not present

## 2021-03-05 DIAGNOSIS — R58 Hemorrhage, not elsewhere classified: Secondary | ICD-10-CM | POA: Diagnosis not present

## 2021-03-05 LAB — URINALYSIS, ROUTINE W REFLEX MICROSCOPIC
Bilirubin Urine: NEGATIVE
Hgb urine dipstick: NEGATIVE
Ketones, ur: NEGATIVE
Leukocytes,Ua: NEGATIVE
Nitrite: NEGATIVE
RBC / HPF: NONE SEEN (ref 0–?)
Specific Gravity, Urine: 1.01 (ref 1.000–1.030)
Total Protein, Urine: 30 — AB
Urine Glucose: NEGATIVE
Urobilinogen, UA: 0.2 (ref 0.0–1.0)
pH: 7.5 (ref 5.0–8.0)

## 2021-03-06 ENCOUNTER — Ambulatory Visit (INDEPENDENT_AMBULATORY_CARE_PROVIDER_SITE_OTHER): Payer: Medicare HMO | Admitting: Family Medicine

## 2021-03-06 VITALS — BP 130/60 | HR 68 | Temp 97.4°F | Resp 16 | Ht 66.0 in | Wt 132.0 lb

## 2021-03-06 DIAGNOSIS — E782 Mixed hyperlipidemia: Secondary | ICD-10-CM | POA: Diagnosis not present

## 2021-03-06 DIAGNOSIS — I480 Paroxysmal atrial fibrillation: Secondary | ICD-10-CM | POA: Diagnosis not present

## 2021-03-06 DIAGNOSIS — N186 End stage renal disease: Secondary | ICD-10-CM | POA: Diagnosis not present

## 2021-03-06 DIAGNOSIS — I272 Pulmonary hypertension, unspecified: Secondary | ICD-10-CM

## 2021-03-06 DIAGNOSIS — M25561 Pain in right knee: Secondary | ICD-10-CM

## 2021-03-06 DIAGNOSIS — I270 Primary pulmonary hypertension: Secondary | ICD-10-CM

## 2021-03-06 DIAGNOSIS — R7989 Other specified abnormal findings of blood chemistry: Secondary | ICD-10-CM

## 2021-03-06 DIAGNOSIS — I10 Essential (primary) hypertension: Secondary | ICD-10-CM

## 2021-03-06 LAB — URINE CULTURE
MICRO NUMBER:: 12949024
SPECIMEN QUALITY:: ADEQUATE

## 2021-03-06 NOTE — Assessment & Plan Note (Signed)
In December had sudden onset knee swelling and pain. Was drained by sports medicine Dr Raeford Razor and it has been better since then. She will follow up with them if worsens again.

## 2021-03-06 NOTE — Patient Instructions (Addendum)
AZO for bladder spasm, urinary frequency can buy over the counter  Shingrix is the new shingles shot, 2 shots over 2-6 months, confirm coverage with insurance and document, then can return here for shots with nurse appt or at pharmacy    Shingles Shingles, which is also known as herpes zoster, is an infection that causes a painful skin rash and fluid-filled blisters. It is caused by a virus. Shingles only develops in people who: Have had chickenpox. Have been vaccinated against chickenpox. Shingles is rare in this group. What are the causes? Shingles is caused by varicella-zoster virus. This is the same virus that causes chickenpox. After a person is exposed to the virus, it stays in the body in an inactive (dormant) state. Shingles develops if the virus is reactivated. This can happen many years after the first (initial) exposure to the virus. It is not known what causes this virus to be reactivated. What increases the risk? People who have had chickenpox or received the chickenpox vaccine are at risk for shingles. Shingles infection is more common in people who: Are older than 76 years of age. Have a weakened disease-fighting system (immune system), such as people with: HIV (human immunodeficiency virus). AIDS (acquired immunodeficiency syndrome). Cancer. Are taking medicines that weaken the immune system, such as organ transplant medicines. Are experiencing a lot of stress. What are the signs or symptoms? Early symptoms of this condition include itching, tingling, and pain in an area on your skin. Pain may be described as burning, stabbing, or throbbing. A few days or weeks after early symptoms start, a painful red rash appears. The rash is usually on one side of the body and has a band-like or belt-like pattern. The rash eventually turns into fluid-filled blisters that break open, change into scabs, and dry up in about 2-3 weeks. At any time during the infection, you may also develop: A  fever. Chills. A headache. Nausea. How is this diagnosed? This condition is diagnosed with a skin exam. Skin or fluid samples (a culture) may be taken from the blisters before a diagnosis is made. How is this treated? The rash may last for several weeks. There is not a specific cure for this condition. Your health care provider may prescribe medicines to help you manage pain, recover more quickly, and avoid long-term problems. Medicines may include: Antiviral medicines. Anti-inflammatory medicines. Pain medicines. Anti-itching medicines (antihistamines). If the area involved is on your face, you may be referred to a specialist, such as an eye doctor (ophthalmologist) or an ear, nose, and throat (ENT) doctor (otorhinolaryngologist) to help you avoid eye problems, chronic pain, or disability. Follow these instructions at home: Medicines Take over-the-counter and prescription medicines only as told by your health care provider. Apply an anti-itch cream or numbing cream to the affected area as told by your health care provider. Relieving itching and discomfort  Apply cold, wet cloths (cold compresses) to the area of the rash or blisters as told by your health care provider. Cool baths can be soothing. Try adding baking soda or dry oatmeal to the water to reduce itching. Do not bathe in hot water. Use calamine lotion as recommended by your health care provider. This is an over-the-counter lotion that helps to relieve itchiness. Blister and rash care Keep your rash covered with a loose bandage (dressing). Wear loose-fitting clothing to help ease the pain of material rubbing against the rash. Wash your hands with soap and water for at least 20 seconds before and after you  change your dressing. If soap and water are not available, use hand sanitizer. Change your dressing as told by your health care provider. Keep your rash and blisters clean by washing the area with mild soap and cool water as told  by your health care provider. Check your rash every day for signs of infection. Check for: More redness, swelling, or pain. Fluid or blood. Warmth. Pus or a bad smell. Do not scratch your rash or pick at your blisters. To help avoid scratching: Keep your fingernails clean and cut short. Wear gloves or mittens while you sleep, if scratching is a problem. General instructions Rest as told by your health care provider. Wash your hands often with soap and water for at least 20 seconds. If soap and water are not available, use hand sanitizer. Doing this lowers your chance of getting a bacterial skin infection. Before your blisters change into scabs, your shingles infection can cause chickenpox in people who have never had it or have never been vaccinated against it. To prevent this from happening, avoid contact with other people, especially: Babies. Pregnant women. Children who have eczema. Older people who have transplants. People who have chronic illnesses, such as cancer or AIDS. Keep all follow-up visits. This is important. How is this prevented? Getting vaccinated is the best way to prevent shingles and protect against shingles complications. If you have not been vaccinated, talk with your health care provider about getting the vaccine. Where to find more information Centers for Disease Control and Prevention: http://www.wolf.info/ Contact a health care provider if: Your pain is not relieved with prescribed medicines. Your pain does not get better after the rash heals. You have any of these signs of infection: More redness, swelling, or pain around the rash. Fluid or blood coming from the rash. Warmth coming from your rash. Pus or a bad smell coming from the rash. A fever. Get help right away if: The rash is on your face or nose. You have facial pain, pain around your eye area, or loss of feeling on one side of your face. You have difficulty seeing. You have ear pain or have ringing in your  ear. You have a loss of taste. Your condition gets worse. Summary Shingles, also known as herpes zoster, is an infection that causes a painful skin rash and fluid-filled blisters. This condition is diagnosed with a skin exam. Skin or fluid samples (a culture) may be taken from the blisters. Keep your rash covered with a loose bandage (dressing). Wear loose-fitting clothing to help ease the pain of material rubbing against the rash. Before your blisters change into scabs, your shingles infection can cause chickenpox in people who have never had it or have never been vaccinated against it. This information is not intended to replace advice given to you by your health care provider. Make sure you discuss any questions you have with your health care provider. Document Revised: 01/15/2020 Document Reviewed: 01/15/2020 Elsevier Patient Education  2022 [employer].

## 2021-03-07 DIAGNOSIS — R58 Hemorrhage, not elsewhere classified: Secondary | ICD-10-CM | POA: Diagnosis not present

## 2021-03-07 DIAGNOSIS — N2581 Secondary hyperparathyroidism of renal origin: Secondary | ICD-10-CM | POA: Diagnosis not present

## 2021-03-07 DIAGNOSIS — D631 Anemia in chronic kidney disease: Secondary | ICD-10-CM | POA: Diagnosis not present

## 2021-03-07 DIAGNOSIS — N186 End stage renal disease: Secondary | ICD-10-CM | POA: Diagnosis not present

## 2021-03-07 DIAGNOSIS — D509 Iron deficiency anemia, unspecified: Secondary | ICD-10-CM | POA: Diagnosis not present

## 2021-03-07 LAB — LIPID PANEL
Cholesterol: 141 mg/dL (ref 0–200)
HDL: 94.8 mg/dL (ref >39.00)
LDL Cholesterol: 27 mg/dL (ref 0–99)
NonHDL: 46.06
Total CHOL/HDL Ratio: 1
Triglycerides: 95 mg/dL (ref 0.0–149.0)
VLDL: 19 mg/dL (ref 0.0–40.0)

## 2021-03-07 LAB — T4, FREE: Free T4: 1.04 ng/dL (ref 0.60–1.60)

## 2021-03-07 LAB — TSH: TSH: 0.09 u[IU]/mL — ABNORMAL LOW (ref 0.35–5.50)

## 2021-03-09 ENCOUNTER — Encounter: Payer: Self-pay | Admitting: Family

## 2021-03-09 DIAGNOSIS — I270 Primary pulmonary hypertension: Secondary | ICD-10-CM | POA: Insufficient documentation

## 2021-03-09 DIAGNOSIS — R7989 Other specified abnormal findings of blood chemistry: Secondary | ICD-10-CM | POA: Insufficient documentation

## 2021-03-09 NOTE — Assessment & Plan Note (Signed)
Well controlled, no changes to meds. Encouraged heart healthy diet such as the DASH diet and exercise as tolerated.  °

## 2021-03-09 NOTE — Assessment & Plan Note (Addendum)
She has been told by her nephrology and transplant evaluation teams at [facility] that her pulmonary hypertension has worsened notably and at present so cannot be on the transplant list. She is understandably disappointed. Will refer to pulmonology for evaluation and consideration

## 2021-03-09 NOTE — Assessment & Plan Note (Signed)
Encourage heart healthy diet such as MIND or DASH diet, increase exercise, avoid trans fats, simple carbohydrates and processed foods, consider a krill or fish or flaxseed oil cap daily. Tolerating Atorvastatin 

## 2021-03-09 NOTE — Assessment & Plan Note (Signed)
Follows with cardiology at [facility], rate controlled, tolerating Coumadin

## 2021-03-09 NOTE — Progress Notes (Signed)
Subjective:    Patient ID: Jillian Hayes, female    DOB: 16-Nov-1945, 76 y.o.   MRN: [id number]  Chief Complaint  Patient presents with   Follow-up    Here for follow up     HPI Patient is in today for follow up on chronic medical concerns. No recent febrile illness or hospitalizations. She has been told by her nephrology and transplant evaluation teams at [facility] that her pulmonary hypertension has worsened notably and at present so cannot be on the transplant list. She is understandably disappointed. Struggles with fatigue and some dyspnea other significant new clinical symptoms. No c/o CP/palp/HA/congestion/fevers/GI or GU c/o. Taking meds as prescribed   Past Medical History:  Diagnosis Date   Advanced care planning/counseling discussion 06/10/2014   05/31/2014 patient presents copy of HCP and Living Will   Anemia    iron deficiency   Anxiety    Arthritis    "in hands"   Atrial fibrillation ([place])    Benign fundic gland polyps of stomach    Bladder polyps 06/25/2010   Chronic headaches 76/16/0737   Complication of anesthesia    "woke up at the end of a cyst removal surgery in 1991"   Depression 1991   hospitalized   Diverticulosis    ESRD (end stage renal disease) ([place]) 11/09/2015   HD on MWF   External prolapsed hemorrhoids    History of chicken pox 06/25/2010   Hypercalcemia 02/18/2014   Hypertension    Insomnia 06/24/2010   Multiple chemical sensitivity syndrome 06/25/2010   Proteinuria 02/18/2014   Renal insufficiency 03/26/2011   Valvular heart disease 04/28/2016    Past Surgical History:  Procedure Laterality Date   AV FISTULA PLACEMENT Left    x4   COLONOSCOPY  2014   cyst on left breast removed Left 1991   benign   ESOPHAGOGASTRODUODENOSCOPY (EGD) WITH ESOPHAGEAL DILATION  2014   LAPAROSCOPIC BILATERAL SALPINGO OOPHERECTOMY Bilateral 08/20/2020   Procedure: LAPAROSCOPIC BILATERAL SALPINGO OOPHORECTOMY;  Surgeon: Megan Salon, MD;  Location: [facility];   Service: Gynecology;  Laterality: Bilateral;   NASAL SEPTUM SURGERY  1986   rhinoplasty   polyps on bladder removed  1972   benign   TONSILLECTOMY  1962    Family History  Problem Relation Age of Onset   Breast cancer Mother 62       left breast removed, 2010 lung   Diabetes Mother    Nephrolithiasis Father    Heart attack Father 72       X 3   Heart disease Father        smoker   Hypertension Brother    Allergic rhinitis Brother    Melanoma Son 3       melanoma on leg removed   Pancreatic cancer Maternal Grandmother    Hypertension Paternal Grandmother    Obesity Paternal Grandmother    Heart attack Paternal Grandfather 70   Diabetes Maternal Aunt        x 2   Diabetes Maternal Uncle        x 3   Asthma Neg Hx    Eczema Neg Hx    Urticaria Neg Hx    Immunodeficiency Neg Hx    Angioedema Neg Hx     Social History   Socioeconomic History   Marital status: Married    Spouse name: Not on file   Number of children: 2   Years of education: Not on file   Highest education level: Not  on file  Occupational History   Occupation: retired  Tobacco Use   Smoking status: Never   Smokeless tobacco: Never  Vaping Use   Vaping Use: Never used  Substance and Sexual Activity   Alcohol use: No   Drug use: No   Sexual activity: Yes    Partners: Male  Other Topics Concern   Not on file  Social History Narrative   Married and lives with husband.     Retired   1 son and 1 daughter   No alcohol tobacco or caffeine   Social Determinants of Radio broadcast assistant Strain: Low Risk    Difficulty of Paying Living Expenses: Not hard at all  Food Insecurity: No Food Insecurity   Worried About Charity fundraiser in the Last Year: Never true   Arboriculturist in the Last Year: Never true  Transportation Needs: No Transportation Needs   Lack of Transportation (Medical): No   Lack of Transportation (Non-Medical): No  Physical Activity: Insufficiently Active   Days of  Exercise per Week: 7 days   Minutes of Exercise per Session: 20 min  Stress: No Stress Concern Present   Feeling of Stress : Not at all  Social Connections: Moderately Integrated   Frequency of Communication with Friends and Family: More than three times a week   Frequency of Social Gatherings with Friends and Family: More than three times a week   Attends Religious Services: More than 4 times per year   Active Member of [employer] or Organizations: No   Attends Archivist Meetings: Never   Marital Status: Married  Human resources officer Violence: Not At Risk   Fear of Current or Ex-Partner: No   Emotionally Abused: No   Physically Abused: No   Sexually Abused: No    Outpatient Medications Prior to Visit  Medication Sig Dispense Refill   acetaminophen (TYLENOL) 500 MG tablet Take 500 mg by mouth every 6 (six) hours as needed for headache (pain).     Alfalfa 500 MG TABS Take 500 mg by mouth 3 (three) times daily.     atorvastatin (LIPITOR) 20 MG tablet Take 20 mg by mouth at bedtime.     carvedilol (COREG) 25 MG tablet Take 1 tablet by mouth twice daily 180 tablet 3   cinacalcet (SENSIPAR) 30 MG tablet Take 30 mg by mouth at bedtime.     diltiazem (CARDIZEM CD) 240 MG 24 hr capsule Take 240 mg by mouth daily at 12 noon.     famotidine (PEPCID) 10 MG tablet Take 1 tablet (10 mg total) by mouth 2 (two) times daily. 30 tablet 1   Flaxseed, Linseed, (FLAX SEED OIL) 1000 MG CAPS Take 1,000 mg by mouth daily.     fluticasone (FLONASE) 50 MCG/ACT nasal spray Place 2 sprays into both nostrils daily. (Patient not taking: Reported on 03/06/2021) 16 g 1   hydrALAZINE (APRESOLINE) 50 MG tablet Take 50 mg by mouth 2 (two) times daily.     L-Glutamine 500 MG CAPS Take 500 mg by mouth daily after supper.     L-Tryptophan 500 MG CAPS Take 500 mg by mouth at bedtime.     lidocaine-prilocaine (EMLA) cream Apply 1 application topically as needed [facility]). (Patient not taking: Reported on 03/06/2021)      multivitamin (RENA-VIT) TABS tablet Take 1 tablet by mouth daily.     OVER THE COUNTER MEDICATION Take 1 capsule by mouth daily. Herblax     Probiotic Product (  PROBIOTIC DAILY PO) Take 1 tablet by mouth daily with lunch.      sevelamer carbonate (RENVELA) 800 MG tablet Take 2,400 mg by mouth 3 (three) times daily.     vitamin C (ASCORBIC ACID) 500 MG tablet Take 500 mg by mouth daily.     Vitamin E 400 units TABS Take 400 Units by mouth daily with lunch.     warfarin (COUMADIN) 5 MG tablet Take 1 tablet (5 mg total) by mouth daily at 4 PM. ONCE DAILY AS DIRECTED 30 tablet 0   Facility-Administered Medications Prior to Visit  Medication Dose Route Frequency Provider Last Rate Last Admin   triamcinolone acetonide (KENALOG-40) injection 40 mg  40 mg Intra-articular Once Rosemarie Ax, MD        Allergies  Allergen Reactions   Sulfa Antibiotics Nausea And Vomiting   Clonidine Derivatives Palpitations    Review of Systems  Constitutional:  Positive for malaise/fatigue. Negative for fever.  HENT:  Negative for congestion.   Eyes:  Negative for blurred vision.  Respiratory:  Positive for shortness of breath. Negative for wheezing.   Cardiovascular:  Negative for chest pain, palpitations and leg swelling.  Gastrointestinal:  Negative for abdominal pain, blood in stool and nausea.  Genitourinary:  Negative for dysuria and frequency.  Musculoskeletal:  Negative for falls.  Skin:  Negative for rash.  Neurological:  Negative for dizziness, loss of consciousness and headaches.  Endo/Heme/Allergies:  Negative for environmental allergies.  Psychiatric/Behavioral:  Negative for depression. The patient is nervous/anxious.       Objective:    Physical Exam Constitutional:      General: She is not in acute distress.    Appearance: She is well-developed.  HENT:     Head: Normocephalic and atraumatic.  Eyes:     Conjunctiva/sclera: Conjunctivae normal.  Neck:     Thyroid: No thyromegaly.   Cardiovascular:     Rate and Rhythm: Normal rate. Rhythm irregular.     Heart sounds: Normal heart sounds. No murmur heard. Pulmonary:     Effort: Pulmonary effort is normal. No respiratory distress.     Breath sounds: Normal breath sounds.  Abdominal:     General: Bowel sounds are normal. There is no distension.     Palpations: Abdomen is soft. There is no mass.     Tenderness: There is no abdominal tenderness.  Musculoskeletal:     Cervical back: Neck supple.  Lymphadenopathy:     Cervical: No cervical adenopathy.  Skin:    General: Skin is warm and dry.  Neurological:     Mental Status: She is alert and oriented to person, place, and time.  Psychiatric:        Behavior: Behavior normal.    BP 130/60 (BP Location: Right Arm, Patient Position: Sitting, Cuff Size: Small)    Pulse 68    Temp (!) 97.4 F (36.3 C) (Oral)    Resp 16    Ht 5' 6"  (1.676 m)    Wt 132 lb (59.9 kg)    SpO2 97%    BMI 21.31 kg/m  Wt Readings from Last 3 Encounters:  03/06/21 132 lb (59.9 kg)  02/25/21 134 lb (60.8 kg)  02/04/21 134 lb 6.4 oz (61 kg)    Diabetic Foot Exam - Simple   No data filed    Lab Results  Component Value Date   WBC 4.3 02/04/2021   HGB 11.0 (L) 02/04/2021   HCT 33.7 (L) 02/04/2021   PLT 165.0  02/04/2021   GLUCOSE 86 08/20/2020   CHOL 141 03/06/2021   TRIG 95.0 03/06/2021   HDL 94.80 03/06/2021   LDLCALC 27 03/06/2021   ALT 20 08/15/2020   AST 31 08/15/2020   NA 137 08/20/2020   K 4.1 08/20/2020   CL 104 08/20/2020   CREATININE 5.00 (H) 08/20/2020   BUN 43 (H) 08/20/2020   CO2 27 08/15/2020   TSH 0.09 (L) 03/06/2021   INR 1.2 08/20/2020   HGBA1C 5.7 (H) 10/25/2017    Lab Results  Component Value Date   TSH 0.09 (L) 03/06/2021   Lab Results  Component Value Date   WBC 4.3 02/04/2021   HGB 11.0 (L) 02/04/2021   HCT 33.7 (L) 02/04/2021   MCV 95.2 02/04/2021   PLT 165.0 02/04/2021   Lab Results  Component Value Date   NA 137 08/20/2020   K 4.1  08/20/2020   CHLORIDE 109 03/25/2016   CO2 27 08/15/2020   GLUCOSE 86 08/20/2020   BUN 43 (H) 08/20/2020   CREATININE 5.00 (H) 08/20/2020   BILITOT 0.7 08/15/2020   ALKPHOS 80 08/15/2020   AST 31 08/15/2020   ALT 20 08/15/2020   PROT 6.2 (L) 08/15/2020   ALBUMIN 3.6 08/15/2020   CALCIUM 9.4 08/15/2020   ANIONGAP 11 08/15/2020   EGFR 20 (L) 03/25/2016   GFR 11.58 (LL) 09/20/2018   Lab Results  Component Value Date   CHOL 141 03/06/2021   Lab Results  Component Value Date   HDL 94.80 03/06/2021   Lab Results  Component Value Date   LDLCALC 27 03/06/2021   Lab Results  Component Value Date   TRIG 95.0 03/06/2021   Lab Results  Component Value Date   CHOLHDL 1 03/06/2021   Lab Results  Component Value Date   HGBA1C 5.7 (H) 10/25/2017       Assessment & Plan:   Problem List Items Addressed This Visit     HTN (hypertension)    Well controlled, no changes to meds. Encouraged heart healthy diet such as the DASH diet and exercise as tolerated.       ESRD (end stage renal disease) ([place])    Continues to work with nephrology but at this time her Pulmonary HTN has worsened and for now she is not a candidate for transplant. Will refer to pulmonology for consultation and consideration of treatment plan      Hyperlipidemia    Encourage heart healthy diet such as MIND or DASH diet, increase exercise, avoid trans fats, simple carbohydrates and processed foods, consider a krill or fish or flaxseed oil cap daily. Tolerating Atorvastatin      Relevant Orders   Lipid panel (Completed)   Atrial fibrillation ([place])    Follows with cardiology at [facility], rate controlled, tolerating Coumadin      Right knee pain    In December had sudden onset knee swelling and pain. Was drained by sports medicine Dr Raeford Razor and it has been better since then. She will follow up with them if worsens again.       Pulmonary hypertension, primary [facility])    She has been told by her nephrology and  transplant evaluation teams at [facility] that her pulmonary hypertension has worsened notably and at present so cannot be on the transplant list. She is understandably disappointed. Will refer to pulmonology for evaluation and consideration      Low TSH level    Normal free T4 and asymptomatic so will continue to monitor for now.  Other Visit Diagnoses     Pulmonary HTN ([place])    -  Primary   Relevant Orders   Ambulatory referral to Pulmonology   Abnormal TSH       Relevant Orders   TSH (Completed)   T4, free (Completed)       I am having [name] maintain her vitamin C, Flax Seed Oil, L-Glutamine, Probiotic Product (PROBIOTIC DAILY PO), Alfalfa, Vitamin E, acetaminophen, sevelamer carbonate, multivitamin, cinacalcet, atorvastatin, diltiazem, hydrALAZINE, carvedilol, L-Tryptophan, OVER THE COUNTER MEDICATION, lidocaine-prilocaine, warfarin, famotidine, and fluticasone. We will continue to administer triamcinolone acetonide.  No orders of the defined types were placed in this encounter.    [name], MD

## 2021-03-09 NOTE — Assessment & Plan Note (Signed)
Stable, continues to follow with nephrology

## 2021-03-09 NOTE — Assessment & Plan Note (Signed)
Continues to work with nephrology but at this time her Pulmonary HTN has worsened and for now she is not a candidate for transplant. Will refer to pulmonology for consultation and consideration of treatment plan

## 2021-03-09 NOTE — Assessment & Plan Note (Signed)
Normal free T4 and asymptomatic so will continue to monitor for now.

## 2021-03-10 DIAGNOSIS — Z7901 Long term (current) use of anticoagulants: Secondary | ICD-10-CM | POA: Diagnosis not present

## 2021-03-10 DIAGNOSIS — N2581 Secondary hyperparathyroidism of renal origin: Secondary | ICD-10-CM | POA: Diagnosis not present

## 2021-03-10 DIAGNOSIS — N186 End stage renal disease: Secondary | ICD-10-CM | POA: Diagnosis not present

## 2021-03-10 DIAGNOSIS — D631 Anemia in chronic kidney disease: Secondary | ICD-10-CM | POA: Diagnosis not present

## 2021-03-10 DIAGNOSIS — R58 Hemorrhage, not elsewhere classified: Secondary | ICD-10-CM | POA: Diagnosis not present

## 2021-03-10 DIAGNOSIS — D509 Iron deficiency anemia, unspecified: Secondary | ICD-10-CM | POA: Diagnosis not present

## 2021-03-12 DIAGNOSIS — R58 Hemorrhage, not elsewhere classified: Secondary | ICD-10-CM | POA: Diagnosis not present

## 2021-03-12 DIAGNOSIS — D509 Iron deficiency anemia, unspecified: Secondary | ICD-10-CM | POA: Diagnosis not present

## 2021-03-12 DIAGNOSIS — D631 Anemia in chronic kidney disease: Secondary | ICD-10-CM | POA: Diagnosis not present

## 2021-03-12 DIAGNOSIS — N2581 Secondary hyperparathyroidism of renal origin: Secondary | ICD-10-CM | POA: Diagnosis not present

## 2021-03-12 DIAGNOSIS — N186 End stage renal disease: Secondary | ICD-10-CM | POA: Diagnosis not present

## 2021-03-14 DIAGNOSIS — N186 End stage renal disease: Secondary | ICD-10-CM | POA: Diagnosis not present

## 2021-03-14 DIAGNOSIS — D631 Anemia in chronic kidney disease: Secondary | ICD-10-CM | POA: Diagnosis not present

## 2021-03-14 DIAGNOSIS — D509 Iron deficiency anemia, unspecified: Secondary | ICD-10-CM | POA: Diagnosis not present

## 2021-03-14 DIAGNOSIS — N2581 Secondary hyperparathyroidism of renal origin: Secondary | ICD-10-CM | POA: Diagnosis not present

## 2021-03-14 DIAGNOSIS — R58 Hemorrhage, not elsewhere classified: Secondary | ICD-10-CM | POA: Diagnosis not present

## 2021-03-17 DIAGNOSIS — Z7901 Long term (current) use of anticoagulants: Secondary | ICD-10-CM | POA: Diagnosis not present

## 2021-03-17 DIAGNOSIS — D631 Anemia in chronic kidney disease: Secondary | ICD-10-CM | POA: Diagnosis not present

## 2021-03-17 DIAGNOSIS — D509 Iron deficiency anemia, unspecified: Secondary | ICD-10-CM | POA: Diagnosis not present

## 2021-03-17 DIAGNOSIS — N186 End stage renal disease: Secondary | ICD-10-CM | POA: Diagnosis not present

## 2021-03-17 DIAGNOSIS — R58 Hemorrhage, not elsewhere classified: Secondary | ICD-10-CM | POA: Diagnosis not present

## 2021-03-17 DIAGNOSIS — N2581 Secondary hyperparathyroidism of renal origin: Secondary | ICD-10-CM | POA: Diagnosis not present

## 2021-03-19 DIAGNOSIS — D631 Anemia in chronic kidney disease: Secondary | ICD-10-CM | POA: Diagnosis not present

## 2021-03-19 DIAGNOSIS — N186 End stage renal disease: Secondary | ICD-10-CM | POA: Diagnosis not present

## 2021-03-19 DIAGNOSIS — R58 Hemorrhage, not elsewhere classified: Secondary | ICD-10-CM | POA: Diagnosis not present

## 2021-03-19 DIAGNOSIS — N2581 Secondary hyperparathyroidism of renal origin: Secondary | ICD-10-CM | POA: Diagnosis not present

## 2021-03-19 DIAGNOSIS — D509 Iron deficiency anemia, unspecified: Secondary | ICD-10-CM | POA: Diagnosis not present

## 2021-03-20 ENCOUNTER — Other Ambulatory Visit: Payer: Self-pay

## 2021-03-20 ENCOUNTER — Ambulatory Visit: Payer: Medicare HMO | Admitting: Pulmonary Disease

## 2021-03-20 ENCOUNTER — Encounter: Payer: Self-pay | Admitting: Pulmonary Disease

## 2021-03-20 VITALS — BP 138/72 | HR 71 | Temp 98.9°F | Ht 66.0 in | Wt 131.0 lb

## 2021-03-20 DIAGNOSIS — I340 Nonrheumatic mitral (valve) insufficiency: Secondary | ICD-10-CM | POA: Diagnosis not present

## 2021-03-20 DIAGNOSIS — I2729 Other secondary pulmonary hypertension: Secondary | ICD-10-CM | POA: Diagnosis not present

## 2021-03-20 DIAGNOSIS — I359 Nonrheumatic aortic valve disorder, unspecified: Secondary | ICD-10-CM

## 2021-03-20 DIAGNOSIS — N186 End stage renal disease: Secondary | ICD-10-CM | POA: Diagnosis not present

## 2021-03-20 DIAGNOSIS — I480 Paroxysmal atrial fibrillation: Secondary | ICD-10-CM

## 2021-03-20 NOTE — Progress Notes (Signed)
@Patient  ID: Jillian Hayes, female    DOB: November 30, 1945, 76 y.o.   MRN: [id number]  Chief Complaint  Patient presents with   Consult    Consult for pulmonary hypertension. Pt states that she has no symptoms    Referring provider: [name], MD  HPI:   76 y.o. whom we are seeing in consultation for presumed pulmonary hypertension.  Note from PCP reviewed.  Most recent note from kidney transplant clinic [place] reviewed.  Patient feels fine.  She has some mild dyspnea.  She is here for evaluation of presumed pulmonary hypertension.  She has been on dialysis for a few years now.  Has had serial echocardiograms done.  Currently undergoing work-up for renal transplant.  Reviewed serial TTE's.  In the past has had evidence of elevated PASP RVSP on echocardiograms.  Most recently 02/2021.  Gradual increase on estimated elevated pressures up to low 50s on my review on most recent TTE.  3 most recent TTE is reviewed.  These also demonstrate on serial exam left atrial dilation, aortic regurgitation, mitral regurgitation.  Most recently demonstrated a estimated right atrial pressure of 8.  In response, dry weight has been challenged by kilogram per her report.  She states that if the PASP is above 50 she is not a candidate as per what she was told.  We had a long discussion about the etiology of pulmonary hypertension and the potential treatments.  Long discussion about contributors in her case including group 2 disease and group 5 disease.  Long discussion about potential work-up here versus at her transplant center.  Counseled her that with encouraged her to asked several questions of her transplant coordinator regarding further evaluation of possible pulmonary hypertension.  Furthermore, advised her any additional work-up or treatment would likely be best to be done at her transplant center.  However, if this cannot be achieved or it is preferred otherwise, we can pursue additional work-up  here.  PMH: ESRD on HD, Surgical history: AV fistula placement, nasal septum surgery, tonsillectomy Family history: Mother with breast cancer, diabetes, father with CAD, hypertension Social history: Never smoker, lives in [employer] / Pulmonary Flowsheets:   ACT:  No flowsheet data found.  MMRC: No flowsheet data found.  Epworth:  No flowsheet data found.  Tests:   FENO:  No results found for: NITRICOXIDE  PFT: No flowsheet data found.  WALK:  No flowsheet data found.  Imaging: Personally reviewed and as per EMR discussion this note [place] LIMITED JOINT SPACE STRUCTURES LOW RIGHT  Result Date: 03/19/2021 Limited ultrasound: Right knee:   Moderate effusion suprapatellar pouch. Normal-appearing quadricep and patellar tendon. Normal knee medially joint space but degenerative changes with increased hyperemia over the medial meniscus. Degenerative changes of the lateral meniscus.   Summary: Effusion with degenerative meniscal tear medially   Ultrasound and interpretation by Clearance Coots, MD     Aspiration/Injection Procedure Note DANICIA TERHAAR 06/22/1945   Procedure: Aspiration and Injection Indications: Right knee pain   Procedure Details Consent: Risks of procedure as well as the alternatives and risks of each were explained to the (patient/caregiver).  Consent for procedure obtained. Time Out: Verified patient identification, verified procedure, site/side was marked, verified correct patient position, special equipment/implants available, medications/allergies/relevent history reviewed, required imaging and test results available.  Performed.  The area was cleaned with iodine and alcohol swabs.    The right knee superior lateral suprapatellar pouch was injected using 3 cc of 1%  lidocaine on a 25-gauge 1-1/2 inch needle.  An 18-gauge 1-1/2 inch needle was used to achieve aspiration.  The syringe was switched and a mixture containing 1 cc's of 40 mg Kenalog and 4 cc's of  0.25% bupivacaine was injected.  Ultrasound was used. Images were obtained in long views showing the injection.    Amount of Fluid Aspirated:  63mL Character of Fluid: clear and red colored Fluid was sent for: n/a   A sterile dressing was applied.   Patient did tolerate procedure well.   Lab Results: Personally reviewed CBC    Component Value Date/Time   WBC 4.3 02/04/2021 1535   RBC 3.54 (L) 02/04/2021 1535   HGB 11.0 (L) 02/04/2021 1535   HGB 10.8 (L) 03/25/2016 1111   HCT 33.7 (L) 02/04/2021 1535   HCT 33.5 (L) 03/25/2016 1111   PLT 165.0 02/04/2021 1535   PLT 254 03/25/2016 1111   MCV 95.2 02/04/2021 1535   MCV 89 03/25/2016 1111   MCH 32.1 08/15/2020 1530   MCHC 32.8 02/04/2021 1535   RDW 15.2 02/04/2021 1535   RDW 14.9 03/25/2016 1111   LYMPHSABS 1.4 02/04/2021 1535   LYMPHSABS 1.7 03/25/2016 1111   MONOABS 0.5 02/04/2021 1535   EOSABS 0.0 02/04/2021 1535   EOSABS 0.3 03/25/2016 1111   BASOSABS 0.0 02/04/2021 1535   BASOSABS 0.0 03/25/2016 1111    BMET    Component Value Date/Time   NA 137 08/20/2020 0733   NA 139 12/06/2019 0000   NA 140 03/25/2016 1111   K 4.1 08/20/2020 0733   K 5.9 No visable hemolysis (H) 03/25/2016 1111   CL 104 08/20/2020 0733   CO2 27 08/15/2020 1530   CO2 23 03/25/2016 1111   GLUCOSE 86 08/20/2020 0733   GLUCOSE 88 03/25/2016 1111   BUN 43 (H) 08/20/2020 0733   BUN 59 (A) 12/06/2019 0000   BUN 51.8 (H) 03/25/2016 1111   CREATININE 5.00 (H) 08/20/2020 0733   CREATININE 2.4 (H) 03/25/2016 1111   CALCIUM 9.4 08/15/2020 1530   CALCIUM 10.5 (H) 03/25/2016 1111   GFRNONAA 8 (L) 08/15/2020 1530   GFRAA 12 (L) 10/20/2019 2238    BNP No results found for: BNP  ProBNP No results found for: PROBNP  Specialty Problems       Pulmonary Problems   Chronic rhinitis    Allergies  Allergen Reactions   Sulfa Antibiotics Nausea And Vomiting   Clonidine Derivatives Palpitations    Immunization History  Administered Date(s)  Administered   Hepatitis A 02/20/2019   Hepatitis A, Ped/Adol-2 Dose 02/20/2019   Hepatitis B 02/20/2019, 06/19/2019, 08/02/2019   Influenza, High Dose Seasonal PF 02/24/2018   Influenza-Unspecified 01/13/2019, 12/01/2019, 11/04/2020   Moderna Sars-Covid-2 Vaccination 03/29/2019, 04/26/2019, 10/06/2019   Pneumococcal Conjugate-13 01/20/2019   Pneumococcal Polysaccharide-23 06/07/2019   Tdap 06/10/2012   Zoster, Live 01/03/2012    Past Medical History:  Diagnosis Date   Advanced care planning/counseling discussion 06/10/2014   05/31/2014 patient presents copy of HCP and Living Will   Anemia    iron deficiency   Anxiety    Arthritis    "in hands"   Atrial fibrillation ([place])    Benign fundic gland polyps of stomach    Bladder polyps 06/25/2010   Chronic headaches 17/49/4496   Complication of anesthesia    "woke up at the end of a cyst removal surgery in 1991"   Depression 1991   hospitalized   Diverticulosis    ESRD (end stage renal disease) (  [place]) 11/09/2015   HD on MWF   External prolapsed hemorrhoids    History of chicken pox 06/25/2010   Hypercalcemia 02/18/2014   Hypertension    Insomnia 06/24/2010   Multiple chemical sensitivity syndrome 06/25/2010   Proteinuria 02/18/2014   Renal insufficiency 03/26/2011   Valvular heart disease 04/28/2016    Tobacco History: Social History   Tobacco Use  Smoking Status Never  Smokeless Tobacco Never   Counseling given: Not Answered   Continue to not smoke  Outpatient Encounter Medications as of 03/20/2021  Medication Sig   acetaminophen (TYLENOL) 500 MG tablet Take 500 mg by mouth every 6 (six) hours as needed for headache (pain).   Alfalfa 500 MG TABS Take 500 mg by mouth 3 (three) times daily.   atorvastatin (LIPITOR) 20 MG tablet Take 20 mg by mouth at bedtime.   carvedilol (COREG) 25 MG tablet Take 1 tablet by mouth twice daily   cinacalcet (SENSIPAR) 30 MG tablet Take 30 mg by mouth at bedtime.   diltiazem  (CARDIZEM CD) 240 MG 24 hr capsule Take 240 mg by mouth daily at 12 noon.   famotidine (PEPCID) 10 MG tablet Take 1 tablet (10 mg total) by mouth 2 (two) times daily.   Flaxseed, Linseed, (FLAX SEED OIL) 1000 MG CAPS Take 1,000 mg by mouth daily.   fluticasone (FLONASE) 50 MCG/ACT nasal spray Place 2 sprays into both nostrils daily.   hydrALAZINE (APRESOLINE) 50 MG tablet Take 50 mg by mouth 2 (two) times daily.   L-Glutamine 500 MG CAPS Take 500 mg by mouth daily after supper.   L-Tryptophan 500 MG CAPS Take 500 mg by mouth at bedtime.   lidocaine-prilocaine (EMLA) cream Apply 1 application topically as needed [facility]).   multivitamin (RENA-VIT) TABS tablet Take 1 tablet by mouth daily.   OVER THE COUNTER MEDICATION Take 1 capsule by mouth daily. Herblax   Probiotic Product (PROBIOTIC DAILY PO) Take 1 tablet by mouth daily with lunch.    sevelamer carbonate (RENVELA) 800 MG tablet Take 2,400 mg by mouth 3 (three) times daily.   vitamin C (ASCORBIC ACID) 500 MG tablet Take 500 mg by mouth daily.   Vitamin E 400 units TABS Take 400 Units by mouth daily with lunch.   warfarin (COUMADIN) 1 MG tablet Take by mouth as directed.   warfarin (COUMADIN) 5 MG tablet Take 1 tablet (5 mg total) by mouth daily at 4 PM. ONCE DAILY AS DIRECTED (Patient taking differently: Take 5 mg by mouth daily at 4 PM. ONCE DAILY AS DIRECTED, TUES, THURS, and Saturday 6mg )   Facility-Administered Encounter Medications as of 03/20/2021  Medication   triamcinolone acetonide (KENALOG-40) injection 40 mg     Review of Systems  Review of Systems  No chest pain exertion.  No orthopnea or PND.  No lower extremity swelling.  Comprehensive review of systems otherwise negative. Physical Exam  BP 138/72 (BP Location: Left Arm, Patient Position: Sitting, Cuff Size: Normal)    Pulse 71    Temp 98.9 F (37.2 C) (Oral)    Ht 5\' 6"  (1.676 m)    Wt 131 lb (59.4 kg)    SpO2 97%    BMI 21.14 kg/m   Wt Readings from Last 5 Encounters:   03/20/21 131 lb (59.4 kg)  03/06/21 132 lb (59.9 kg)  02/25/21 134 lb (60.8 kg)  02/04/21 134 lb 6.4 oz (61 kg)  01/30/21 134 lb 9.6 oz (61.1 kg)    BMI Readings from Last 5  Encounters:  03/20/21 21.14 kg/m  03/06/21 21.31 kg/m  02/25/21 21.63 kg/m  02/04/21 21.69 kg/m  01/30/21 21.08 kg/m     Physical Exam General: Well-appearing, no acute distress Eyes: EOMI, no icterus Neck: Supple, no JVP Pulmonary: Clear, normal work of breathing Cardiovascular: Regular rhythm, midsystolic murmur Abdomen: Nondistended, bowel sounds present MSK: No synovitis, joint effusion Neuro: Normal gait, no weakness Psych: Normal mood, full affect   Assessment & Plan:   Presumed pulmonary hypertension: Based on TTE.  Elevated estimated PASP.  Gradually increased over time.  RA dilation most recently.  RV size and function preserved.  Likely multifactorial and certainly largely driven by group 2 disease given her proceeding valvular dysfunction as well as evidence of left atrial dilation was been present over time.  High suspicion for contribution of group 5 disease given the worsened estimated pressures over time of dialysis.  Suspect intermittent volume overload contributing to stress between dialysis sessions.  In addition, possible development of high-output failure in the setting of her AV fistula.  Discussed at length with patient.  Made recommendation to discuss certain concerns with her transplant coordinator.  If further work-up of pulmonary hypertension is desired, I recommended this be done at [facility] so that all information is shared at her transplant center.  I recommended consideration of repeat TTE, evaluation of flow velocity through fistula see if these can be reduced, consideration of right heart catheterization and consideration of AV fistula occlusion during right heart catheterization.  If unable to coordinate at her transplant center, we can pursue these interventions  here locally.   Return in about 2 months (around 05/18/2021).   Lanier Clam, MD 03/20/2021   This appointment required 85 minutes of patient care (this includes precharting, chart review, review of results, face-to-face care, etc.).

## 2021-03-20 NOTE — Patient Instructions (Addendum)
Nice to meet you  I suspect the elevated pulmonary pressures or  pulmonary hypertension is most related to gradual or intermittent fluid buildup.  Signs on the heart ultrasound demonstrated you are predisposed to extra fluid based on the leakiness of the valve and some stretching of a heart chamber on the left side.  It is likely the fluid buildup has intermittently worsened while on dialysis as well as a possible contribution of increased flow through the AV fistula putting additional stress on the right heart.  I would discuss the following with your transplant coordinator:  Repeating echocardiogram now that dry weight has decreased (it sounds like this is already in the works)  Evaluating flow or velocity through current fistula to see if it is high and if so considering decreasing the flow with the procedure to see if this helps reduce stress on the heart  A right heart catheterization to directly measure the pressures in the heart (the echocardiogram is an estimation not a measure)  While doing a right heart catheterization, occluding the AV fistula to see if there are decreases in the pressure or changes in cardiac output that would suggest the AV fistula is contributing to the pulmonary hypertension.  Return to clinic in 2 months or sooner as needed with Dr. Silas Flood

## 2021-03-21 DIAGNOSIS — R58 Hemorrhage, not elsewhere classified: Secondary | ICD-10-CM | POA: Diagnosis not present

## 2021-03-21 DIAGNOSIS — N2581 Secondary hyperparathyroidism of renal origin: Secondary | ICD-10-CM | POA: Diagnosis not present

## 2021-03-21 DIAGNOSIS — N186 End stage renal disease: Secondary | ICD-10-CM | POA: Diagnosis not present

## 2021-03-21 DIAGNOSIS — D631 Anemia in chronic kidney disease: Secondary | ICD-10-CM | POA: Diagnosis not present

## 2021-03-21 DIAGNOSIS — D509 Iron deficiency anemia, unspecified: Secondary | ICD-10-CM | POA: Diagnosis not present

## 2021-03-24 DIAGNOSIS — Z7901 Long term (current) use of anticoagulants: Secondary | ICD-10-CM | POA: Diagnosis not present

## 2021-03-24 DIAGNOSIS — D631 Anemia in chronic kidney disease: Secondary | ICD-10-CM | POA: Diagnosis not present

## 2021-03-24 DIAGNOSIS — R58 Hemorrhage, not elsewhere classified: Secondary | ICD-10-CM | POA: Diagnosis not present

## 2021-03-24 DIAGNOSIS — N2581 Secondary hyperparathyroidism of renal origin: Secondary | ICD-10-CM | POA: Diagnosis not present

## 2021-03-24 DIAGNOSIS — N186 End stage renal disease: Secondary | ICD-10-CM | POA: Diagnosis not present

## 2021-03-24 DIAGNOSIS — D509 Iron deficiency anemia, unspecified: Secondary | ICD-10-CM | POA: Diagnosis not present

## 2021-03-26 DIAGNOSIS — N2581 Secondary hyperparathyroidism of renal origin: Secondary | ICD-10-CM | POA: Diagnosis not present

## 2021-03-26 DIAGNOSIS — D509 Iron deficiency anemia, unspecified: Secondary | ICD-10-CM | POA: Diagnosis not present

## 2021-03-26 DIAGNOSIS — R58 Hemorrhage, not elsewhere classified: Secondary | ICD-10-CM | POA: Diagnosis not present

## 2021-03-26 DIAGNOSIS — D631 Anemia in chronic kidney disease: Secondary | ICD-10-CM | POA: Diagnosis not present

## 2021-03-26 DIAGNOSIS — N186 End stage renal disease: Secondary | ICD-10-CM | POA: Diagnosis not present

## 2021-03-28 DIAGNOSIS — D631 Anemia in chronic kidney disease: Secondary | ICD-10-CM | POA: Diagnosis not present

## 2021-03-28 DIAGNOSIS — N186 End stage renal disease: Secondary | ICD-10-CM | POA: Diagnosis not present

## 2021-03-28 DIAGNOSIS — R58 Hemorrhage, not elsewhere classified: Secondary | ICD-10-CM | POA: Diagnosis not present

## 2021-03-28 DIAGNOSIS — N2581 Secondary hyperparathyroidism of renal origin: Secondary | ICD-10-CM | POA: Diagnosis not present

## 2021-03-28 DIAGNOSIS — D509 Iron deficiency anemia, unspecified: Secondary | ICD-10-CM | POA: Diagnosis not present

## 2021-03-30 NOTE — Progress Notes (Signed)
To have         Cardiology Office Note   Date:  04/02/2021   ID:  Jillian Hayes, DOB Oct 03, 1945, MRN [id number]  PCP:  [name], MD  Cardiologist:   Minus Breeding, MD  Referring:  [name], MD   Chief Complaint  Patient presents with   Atrial Fibrillation      History of Present Illness: Jillian Hayes is a 76 y.o. female who presents for follow up of HTN.   She had an abnormal EKG and borderline POET (Plain [employer] Exercise Treadmill).  I sent her for a perfusion study which was negative for ischemia.  She was admitted 9/23-9/25/2019 for atrial fibrillation, RVR.  She spontaneously converted to sinus rhythm and was started on Cardizem and Coumadin.  Echo showed mod MR and AI.    She is being worked up for renal transplant but was noted on a recent echo at [facility] increased pulmonary pressures compared to the previous.  The estimated right pressure was 52 mm.  She has been turned down for transplant because of this.  She did see a pulmonologist who suggested that this was probably  WHO class II or V.  The plan was probably to increase her volume removal with dialysis and reassess with follow-up transthoracic echo.  She actually feels well.  She has had none of the atrial fibrillation that she had previously.  She denies any shortness of breath, PND or orthopnea.  She has no palpitations, presyncope or syncope.  She does her activities of daily living including grocery shopping and feels fine with this.  She has no lower extremity swelling.  She is having no chest pressure, neck or arm discomfort   Past Medical History:  Diagnosis Date   Advanced care planning/counseling discussion 06/10/2014   05/31/2014 patient presents copy of HCP and Living Will   Anemia    iron deficiency   Anxiety    Arthritis    "in hands"   Atrial fibrillation ([place])    Benign fundic gland polyps of stomach    Bladder polyps 06/25/2010   Chronic headaches 84/69/6295   Complication of anesthesia     "woke up at the end of a cyst removal surgery in 1991"   Depression 1991   hospitalized   Diverticulosis    ESRD (end stage renal disease) ([place]) 11/09/2015   HD on MWF   External prolapsed hemorrhoids    History of chicken pox 06/25/2010   Hypercalcemia 02/18/2014   Hypertension    Insomnia 06/24/2010   Multiple chemical sensitivity syndrome 06/25/2010   Proteinuria 02/18/2014   Renal insufficiency 03/26/2011   Valvular heart disease 04/28/2016    Past Surgical History:  Procedure Laterality Date   AV FISTULA PLACEMENT Left    x4   COLONOSCOPY  2014   cyst on left breast removed Left 1991   benign   ESOPHAGOGASTRODUODENOSCOPY (EGD) WITH ESOPHAGEAL DILATION  2014   LAPAROSCOPIC BILATERAL SALPINGO OOPHERECTOMY Bilateral 08/20/2020   Procedure: LAPAROSCOPIC BILATERAL SALPINGO OOPHORECTOMY;  Surgeon: Megan Salon, MD;  Location: [facility];  Service: Gynecology;  Laterality: Bilateral;   NASAL SEPTUM SURGERY  1986   rhinoplasty   polyps on bladder removed  1972   benign   TONSILLECTOMY  1962     Current Outpatient Medications  Medication Sig Dispense Refill   acetaminophen (TYLENOL) 500 MG tablet Take 500 mg by mouth every 6 (six) hours as needed for headache (pain).     Alfalfa  500 MG TABS Take 500 mg by mouth 3 (three) times daily.     atorvastatin (LIPITOR) 20 MG tablet Take 20 mg by mouth at bedtime.     carvedilol (COREG) 25 MG tablet Take 1 tablet by mouth twice daily 180 tablet 3   cinacalcet (SENSIPAR) 30 MG tablet Take 30 mg by mouth at bedtime.     diltiazem (CARDIZEM CD) 240 MG 24 hr capsule Take 240 mg by mouth daily at 12 noon.     famotidine (PEPCID) 10 MG tablet Take 1 tablet (10 mg total) by mouth 2 (two) times daily. 30 tablet 1   Flaxseed, Linseed, (FLAX SEED OIL) 1000 MG CAPS Take 1,000 mg by mouth daily.     fluticasone (FLONASE) 50 MCG/ACT nasal spray Place 2 sprays into both nostrils daily. 16 g 1   hydrALAZINE (APRESOLINE) 50 MG tablet Take 50 mg by mouth  2 (two) times daily.     L-Glutamine 500 MG CAPS Take 500 mg by mouth daily after supper.     L-Tryptophan 500 MG CAPS Take 500 mg by mouth at bedtime.     lidocaine-prilocaine (EMLA) cream Apply 1 application topically as needed [facility]).     multivitamin (RENA-VIT) TABS tablet Take 1 tablet by mouth daily.     OVER THE COUNTER MEDICATION Take 1 capsule by mouth daily. Herblax     Probiotic Product (PROBIOTIC DAILY PO) Take 1 tablet by mouth daily with lunch.      sevelamer carbonate (RENVELA) 800 MG tablet Take 2,400 mg by mouth 3 (three) times daily.     vitamin C (ASCORBIC ACID) 500 MG tablet Take 500 mg by mouth daily.     Vitamin E 400 units TABS Take 400 Units by mouth daily with lunch.     warfarin (COUMADIN) 1 MG tablet Take by mouth as directed.     warfarin (COUMADIN) 5 MG tablet Take 1 tablet (5 mg total) by mouth daily at 4 PM. ONCE DAILY AS DIRECTED (Patient taking differently: Take 5 mg by mouth daily at 4 PM. ONCE DAILY AS DIRECTED, TUES, THURS, and Saturday 6mg ) 30 tablet 0   Current Facility-Administered Medications  Medication Dose Route Frequency Provider Last Rate Last Admin   triamcinolone acetonide (KENALOG-40) injection 40 mg  40 mg Intra-articular Once Rosemarie Ax, MD        Allergies:   Sulfa antibiotics and Clonidine derivatives    ROS:  Please see the history of present illness.   Otherwise, review of systems are positive for none.   All other systems are reviewed and negative.    PHYSICAL EXAM: VS:  BP 132/66 (BP Location: Right Arm, Patient Position: Sitting, Cuff Size: Normal)    Pulse 71    Ht 5\' 6"  (1.676 m)    Wt 128 lb (58.1 kg)    BMI 20.66 kg/m  , BMI Body mass index is 20.66 kg/m. GENERAL:  Well appearing NECK: Positive jugular venous distention, waveform within normal limits, carotid upstroke brisk and symmetric, no bruits, no thyromegaly LUNGS:  Clear to auscultation bilaterally CHEST:  Unremarkable HEART:  PMI not displaced or sustained,S1  and S2 within normal limits, no S3, no S4, no clicks, no rubs, 2 out of 6 apical systolic murmur radiating at the aortic outflow tract, 2 out of 6 diastolic murmur ABD:  Flat, positive bowel sounds normal in frequency in pitch, no bruits, no rebound, no guarding, no midline pulsatile mass, no hepatomegaly, no splenomegaly EXT:  2 plus  pulses throughout, no edema, no cyanosis no clubbing, left upper arm thrill and bruit with AV fistula  EKG:  EKG is  ordered today. Sinus rhythm, rate 71, premature atrial contractions, no acute ST-T wave changes.  Premature ventricular contractions  Recent Labs: 08/15/2020: ALT 20 03/06/2021: TSH 0.09 04/01/2021: BUN 35; Creatinine, Ser 5.16; Hemoglobin 12.1; Platelets 212; Potassium 5.1; Sodium 145    Lipid Panel    Component Value Date/Time   CHOL 141 03/06/2021 1533   TRIG 95.0 03/06/2021 1533   HDL 94.80 03/06/2021 1533   CHOLHDL 1 03/06/2021 1533   VLDL 19.0 03/06/2021 1533   LDLCALC 27 03/06/2021 1533      Wt Readings from Last 3 Encounters:  04/01/21 128 lb (58.1 kg)  03/20/21 131 lb (59.4 kg)  03/06/21 132 lb (59.9 kg)      Other studies Reviewed: Additional studies/ records that were reviewed today include:   [facility] and [facility] records Review of the above records demonstrates: See elsewhere   ASSESSMENT AND PLAN:  ATRIAL FIB:   [name] has a CHA2DS2 - VASc score of 3.  She has not had any symptomatic paroxysms.  No change in therapy.  She tolerates anticoagulation.     HTN:  The blood pressure is at target.  No change in therapy.   MILD/MODERATE AI:   This was mild to moderate on echo in March 2021.  I did review the most recent echo.  This was unchanged from previous and I will follow this again clinically.  PULMONARY HTN: I think this is probably class II.  I think right heart catheterization is indicated to further more accurately assess this.  She agrees and we will arrange.  Risks and benefits of the procedure were  discussed and she agrees to proceed.   Current medicines are reviewed at length with the patient today.  The patient does not have concerns regarding medicines.  The following changes have been made:    Labs/ tests ordered today include:     Orders Placed This Encounter  Procedures   Basic metabolic panel   CBC   Protime-INR   EKG 12-Lead     Disposition:   FU with me in six months.    Signed, Minus Breeding, MD  04/02/2021 12:48 PM    [facility]

## 2021-03-30 NOTE — H&P (View-Only) (Signed)
To have         Cardiology Office Note   Date:  04/02/2021   ID:  Jillian Hayes, DOB Jun 24, 1945, MRN [id number]  PCP:  [name], MD  Cardiologist:   Minus Breeding, MD  Referring:  [name], MD   Chief Complaint  Patient presents with   Atrial Fibrillation      History of Present Illness: Jillian Hayes is a 76 y.o. female who presents for follow up of HTN.   She had an abnormal EKG and borderline POET (Plain Old Exercise Treadmill).  I sent her for a perfusion study which was negative for ischemia.  She was admitted 9/23-9/25/2019 for atrial fibrillation, RVR.  She spontaneously converted to sinus rhythm and was started on Cardizem and Coumadin.  Echo showed mod MR and AI.    She is being worked up for renal transplant but was noted on a recent echo at [facility] increased pulmonary pressures compared to the previous.  The estimated right pressure was 52 mm.  She has been turned down for transplant because of this.  She did see a pulmonologist who suggested that this was probably  WHO class II or V.  The plan was probably to increase her volume removal with dialysis and reassess with follow-up transthoracic echo.  She actually feels well.  She has had none of the atrial fibrillation that she had previously.  She denies any shortness of breath, PND or orthopnea.  She has no palpitations, presyncope or syncope.  She does her activities of daily living including grocery shopping and feels fine with this.  She has no lower extremity swelling.  She is having no chest pressure, neck or arm discomfort   Past Medical History:  Diagnosis Date   Advanced care planning/counseling discussion 06/10/2014   05/31/2014 patient presents copy of HCP and Living Will   Anemia    iron deficiency   Anxiety    Arthritis    "in hands"   Atrial fibrillation ([place])    Benign fundic gland polyps of stomach    Bladder polyps 06/25/2010   Chronic headaches 02/63/7858   Complication of anesthesia     "woke up at the end of a cyst removal surgery in 1991"   Depression 1991   hospitalized   Diverticulosis    ESRD (end stage renal disease) ([place]) 11/09/2015   HD on MWF   External prolapsed hemorrhoids    History of chicken pox 06/25/2010   Hypercalcemia 02/18/2014   Hypertension    Insomnia 06/24/2010   Multiple chemical sensitivity syndrome 06/25/2010   Proteinuria 02/18/2014   Renal insufficiency 03/26/2011   Valvular heart disease 04/28/2016    Past Surgical History:  Procedure Laterality Date   AV FISTULA PLACEMENT Left    x4   COLONOSCOPY  2014   cyst on left breast removed Left 1991   benign   ESOPHAGOGASTRODUODENOSCOPY (EGD) WITH ESOPHAGEAL DILATION  2014   LAPAROSCOPIC BILATERAL SALPINGO OOPHERECTOMY Bilateral 08/20/2020   Procedure: LAPAROSCOPIC BILATERAL SALPINGO OOPHORECTOMY;  Surgeon: Megan Salon, MD;  Location: [facility];  Service: Gynecology;  Laterality: Bilateral;   NASAL SEPTUM SURGERY  1986   rhinoplasty   polyps on bladder removed  1972   benign   TONSILLECTOMY  1962     Current Outpatient Medications  Medication Sig Dispense Refill   acetaminophen (TYLENOL) 500 MG tablet Take 500 mg by mouth every 6 (six) hours as needed for headache (pain).     Alfalfa  500 MG TABS Take 500 mg by mouth 3 (three) times daily.     atorvastatin (LIPITOR) 20 MG tablet Take 20 mg by mouth at bedtime.     carvedilol (COREG) 25 MG tablet Take 1 tablet by mouth twice daily 180 tablet 3   cinacalcet (SENSIPAR) 30 MG tablet Take 30 mg by mouth at bedtime.     diltiazem (CARDIZEM CD) 240 MG 24 hr capsule Take 240 mg by mouth daily at 12 noon.     famotidine (PEPCID) 10 MG tablet Take 1 tablet (10 mg total) by mouth 2 (two) times daily. 30 tablet 1   Flaxseed, Linseed, (FLAX SEED OIL) 1000 MG CAPS Take 1,000 mg by mouth daily.     fluticasone (FLONASE) 50 MCG/ACT nasal spray Place 2 sprays into both nostrils daily. 16 g 1   hydrALAZINE (APRESOLINE) 50 MG tablet Take 50 mg by mouth  2 (two) times daily.     L-Glutamine 500 MG CAPS Take 500 mg by mouth daily after supper.     L-Tryptophan 500 MG CAPS Take 500 mg by mouth at bedtime.     lidocaine-prilocaine (EMLA) cream Apply 1 application topically as needed [facility]).     multivitamin (RENA-VIT) TABS tablet Take 1 tablet by mouth daily.     OVER THE COUNTER MEDICATION Take 1 capsule by mouth daily. Herblax     Probiotic Product (PROBIOTIC DAILY PO) Take 1 tablet by mouth daily with lunch.      sevelamer carbonate (RENVELA) 800 MG tablet Take 2,400 mg by mouth 3 (three) times daily.     vitamin C (ASCORBIC ACID) 500 MG tablet Take 500 mg by mouth daily.     Vitamin E 400 units TABS Take 400 Units by mouth daily with lunch.     warfarin (COUMADIN) 1 MG tablet Take by mouth as directed.     warfarin (COUMADIN) 5 MG tablet Take 1 tablet (5 mg total) by mouth daily at 4 PM. ONCE DAILY AS DIRECTED (Patient taking differently: Take 5 mg by mouth daily at 4 PM. ONCE DAILY AS DIRECTED, TUES, THURS, and Saturday 6mg ) 30 tablet 0   Current Facility-Administered Medications  Medication Dose Route Frequency Provider Last Rate Last Admin   triamcinolone acetonide (KENALOG-40) injection 40 mg  40 mg Intra-articular Once Rosemarie Ax, MD        Allergies:   Sulfa antibiotics and Clonidine derivatives    ROS:  Please see the history of present illness.   Otherwise, review of systems are positive for none.   All other systems are reviewed and negative.    PHYSICAL EXAM: VS:  BP 132/66 (BP Location: Right Arm, Patient Position: Sitting, Cuff Size: Normal)    Pulse 71    Ht 5\' 6"  (1.676 m)    Wt 128 lb (58.1 kg)    BMI 20.66 kg/m  , BMI Body mass index is 20.66 kg/m. GENERAL:  Well appearing NECK: Positive jugular venous distention, waveform within normal limits, carotid upstroke brisk and symmetric, no bruits, no thyromegaly LUNGS:  Clear to auscultation bilaterally CHEST:  Unremarkable HEART:  PMI not displaced or sustained,S1  and S2 within normal limits, no S3, no S4, no clicks, no rubs, 2 out of 6 apical systolic murmur radiating at the aortic outflow tract, 2 out of 6 diastolic murmur ABD:  Flat, positive bowel sounds normal in frequency in pitch, no bruits, no rebound, no guarding, no midline pulsatile mass, no hepatomegaly, no splenomegaly EXT:  2 plus  pulses throughout, no edema, no cyanosis no clubbing, left upper arm thrill and bruit with AV fistula  EKG:  EKG is  ordered today. Sinus rhythm, rate 71, premature atrial contractions, no acute ST-T wave changes.  Premature ventricular contractions  Recent Labs: 08/15/2020: ALT 20 03/06/2021: TSH 0.09 04/01/2021: BUN 35; Creatinine, Ser 5.16; Hemoglobin 12.1; Platelets 212; Potassium 5.1; Sodium 145    Lipid Panel    Component Value Date/Time   CHOL 141 03/06/2021 1533   TRIG 95.0 03/06/2021 1533   HDL 94.80 03/06/2021 1533   CHOLHDL 1 03/06/2021 1533   VLDL 19.0 03/06/2021 1533   LDLCALC 27 03/06/2021 1533      Wt Readings from Last 3 Encounters:  04/01/21 128 lb (58.1 kg)  03/20/21 131 lb (59.4 kg)  03/06/21 132 lb (59.9 kg)      Other studies Reviewed: Additional studies/ records that were reviewed today include:   [facility] and pulmonary records Review of the above records demonstrates: See elsewhere   ASSESSMENT AND PLAN:  ATRIAL FIB:   Jillian Hayes has a CHA2DS2 - VASc score of 3.  She has not had any symptomatic paroxysms.  No change in therapy.  She tolerates anticoagulation.     HTN:  The blood pressure is at target.  No change in therapy.   MILD/MODERATE AI:   This was mild to moderate on echo in March 2021.  I did review the most recent echo.  This was unchanged from previous and I will follow this again clinically.  PULMONARY HTN: I think this is probably class II.  I think right heart catheterization is indicated to further more accurately assess this.  She agrees and we will arrange.  Risks and benefits of the procedure were  discussed and she agrees to proceed.   Current medicines are reviewed at length with the patient today.  The patient does not have concerns regarding medicines.  The following changes have been made:    Labs/ tests ordered today include:     Orders Placed This Encounter  Procedures   Basic metabolic panel   CBC   Protime-INR   EKG 12-Lead     Disposition:   FU with me in six months.    Signed, Minus Breeding, MD  04/02/2021 12:48 PM    [facility]

## 2021-03-31 DIAGNOSIS — D631 Anemia in chronic kidney disease: Secondary | ICD-10-CM | POA: Diagnosis not present

## 2021-03-31 DIAGNOSIS — D509 Iron deficiency anemia, unspecified: Secondary | ICD-10-CM | POA: Diagnosis not present

## 2021-03-31 DIAGNOSIS — R58 Hemorrhage, not elsewhere classified: Secondary | ICD-10-CM | POA: Diagnosis not present

## 2021-03-31 DIAGNOSIS — N2581 Secondary hyperparathyroidism of renal origin: Secondary | ICD-10-CM | POA: Diagnosis not present

## 2021-03-31 DIAGNOSIS — N186 End stage renal disease: Secondary | ICD-10-CM | POA: Diagnosis not present

## 2021-03-31 DIAGNOSIS — Z7901 Long term (current) use of anticoagulants: Secondary | ICD-10-CM | POA: Diagnosis not present

## 2021-04-01 ENCOUNTER — Other Ambulatory Visit: Payer: Self-pay

## 2021-04-01 ENCOUNTER — Encounter: Payer: Self-pay | Admitting: Cardiology

## 2021-04-01 ENCOUNTER — Ambulatory Visit: Payer: Medicare HMO | Admitting: Cardiology

## 2021-04-01 VITALS — BP 132/66 | HR 71 | Ht 66.0 in | Wt 128.0 lb

## 2021-04-01 DIAGNOSIS — Z992 Dependence on renal dialysis: Secondary | ICD-10-CM | POA: Diagnosis not present

## 2021-04-01 DIAGNOSIS — I359 Nonrheumatic aortic valve disorder, unspecified: Secondary | ICD-10-CM | POA: Diagnosis not present

## 2021-04-01 DIAGNOSIS — N186 End stage renal disease: Secondary | ICD-10-CM | POA: Diagnosis not present

## 2021-04-01 DIAGNOSIS — I480 Paroxysmal atrial fibrillation: Secondary | ICD-10-CM | POA: Diagnosis not present

## 2021-04-01 DIAGNOSIS — I10 Essential (primary) hypertension: Secondary | ICD-10-CM

## 2021-04-01 NOTE — Patient Instructions (Addendum)
Medication Instructions:  No changes  *If you need a refill on your cardiac medications before your next appointment, please call your pharmacy*  Follow-Up: At [facility], you and your health needs are our priority.  As part of our continuing mission to provide you with exceptional heart care, we have created designated Provider Care Teams.  These Care Teams include your primary Cardiologist (physician) and Advanced Practice Providers (APPs -  Physician Assistants and Nurse Practitioners) who all work together to provide you with the care you need, when you need it.  We recommend signing up for the patient portal called "MyChart".  Sign up information is provided on this After Visit Summary.  MyChart is used to connect with patients for Virtual Visits (Telemedicine).  Patients are able to view lab/test results, encounter notes, upcoming appointments, etc.  Non-urgent messages can be sent to your provider as well.   To learn more about what you can do with MyChart, go to [email or web address].    Your next appointment:   3 week(s)  The format for your next appointment:   In Person  Provider:   Minus Breeding, MD or APP  Other Instructions  [place] [place] [address] Dept: [phone] Loc: [facility]  04/01/2021  You are scheduled for a Cardiac Catheterization on 04/07/21 with Dr. Aundra Dubin  1. Please arrive at the [facility] (Main Entrance A) at [facility]: [address] [place], [place] [address] at 5:30 am (This time is two hours before your procedure to ensure your preparation). Free valet parking service is available.   Special note: Every effort is made to have your procedure done on time. Please understand that emergencies sometimes delay scheduled procedures.  2. Diet: Do not eat solid foods after midnight.  The patient may have clear  liquids until 5am upon the day of the procedure.  3. Labs: You will need to have blood drawn on 2/28  4. Medication instructions in preparation for your procedure: Hold the warfarin 4 days prior to the procedure (Thursday-Sunday)  On the morning of your procedure, take your Aspirin and any morning medicines NOT listed above.  You may use sips of water.  5. Plan for one night stay--bring personal belongings. 6. Bring a current list of your medications and current insurance cards. 7. You MUST have a responsible person to drive you home. 8. Someone MUST be with you the first 24 hours after you arrive home or your discharge will be delayed. 9. Please wear clothes that are easy to get on and off and wear slip-on shoes.  Thank you for allowing [place] to care for you!   -- [facility]

## 2021-04-02 ENCOUNTER — Encounter: Payer: Self-pay | Admitting: Cardiology

## 2021-04-02 DIAGNOSIS — D631 Anemia in chronic kidney disease: Secondary | ICD-10-CM | POA: Diagnosis not present

## 2021-04-02 DIAGNOSIS — N186 End stage renal disease: Secondary | ICD-10-CM | POA: Diagnosis not present

## 2021-04-02 DIAGNOSIS — D509 Iron deficiency anemia, unspecified: Secondary | ICD-10-CM | POA: Diagnosis not present

## 2021-04-02 DIAGNOSIS — N2581 Secondary hyperparathyroidism of renal origin: Secondary | ICD-10-CM | POA: Diagnosis not present

## 2021-04-02 LAB — CBC
Hematocrit: 36.7 % (ref 34.0–46.6)
Hemoglobin: 12.1 g/dL (ref 11.1–15.9)
MCH: 31.9 pg (ref 26.6–33.0)
MCHC: 33 g/dL (ref 31.5–35.7)
MCV: 97 fL (ref 79–97)
Platelets: 212 10*3/uL (ref 150–450)
RBC: 3.79 x10E6/uL (ref 3.77–5.28)
RDW: 15 % (ref 11.7–15.4)
WBC: 4.2 10*3/uL (ref 3.4–10.8)

## 2021-04-02 LAB — BASIC METABOLIC PANEL
BUN/Creatinine Ratio: 7 — ABNORMAL LOW (ref 12–28)
BUN: 35 mg/dL — ABNORMAL HIGH (ref 8–27)
CO2: 25 mmol/L (ref 20–29)
Calcium: 9.7 mg/dL (ref 8.7–10.3)
Chloride: 104 mmol/L (ref 96–106)
Creatinine, Ser: 5.16 mg/dL — ABNORMAL HIGH (ref 0.57–1.00)
Glucose: 76 mg/dL (ref 70–99)
Potassium: 5.1 mmol/L (ref 3.5–5.2)
Sodium: 145 mmol/L — ABNORMAL HIGH (ref 134–144)
eGFR: 8 mL/min/{1.73_m2} — ABNORMAL LOW (ref >59)

## 2021-04-02 LAB — PROTIME-INR
INR: 1.4 — ABNORMAL HIGH (ref 0.9–1.2)
Prothrombin Time: 15 s — ABNORMAL HIGH (ref 9.1–12.0)

## 2021-04-04 ENCOUNTER — Telehealth: Payer: Self-pay | Admitting: Cardiology

## 2021-04-04 DIAGNOSIS — D509 Iron deficiency anemia, unspecified: Secondary | ICD-10-CM | POA: Diagnosis not present

## 2021-04-04 DIAGNOSIS — N2581 Secondary hyperparathyroidism of renal origin: Secondary | ICD-10-CM | POA: Diagnosis not present

## 2021-04-04 DIAGNOSIS — D631 Anemia in chronic kidney disease: Secondary | ICD-10-CM | POA: Diagnosis not present

## 2021-04-04 DIAGNOSIS — N186 End stage renal disease: Secondary | ICD-10-CM | POA: Diagnosis not present

## 2021-04-04 NOTE — Telephone Encounter (Signed)
Patient calling to requesting to reschedule her cardiac cath. She says she needs the call after 4 pm today.  ?

## 2021-04-04 NOTE — Telephone Encounter (Signed)
Per Dr Percival Spanish: ?OK to reschedule right heart cath whenever it works for her.  She needs to know that she will not be getting dye. Called the cath lab and rescheduled pt's CATH for Thursday 04-10-21 at 730am arrive at [facility] at 530am. This is the same time as the other CATH. Pt notified, tried to tell pt twice but somehow got disconnected (I think pt is at dialysis) so, I left a detailed message for pt and to call back if any questions. ?

## 2021-04-04 NOTE — Telephone Encounter (Signed)
Returned call to pt she states that she has dialysis Monday, wed and fridays from [facility] and dialysis nurse states that she will need to have cath on another day other than that Monday. Tuesday or Thursday are non-dialysis days. Ok to re-schedule? Please advise? ?

## 2021-04-07 ENCOUNTER — Telehealth: Payer: Self-pay | Admitting: *Deleted

## 2021-04-07 DIAGNOSIS — D509 Iron deficiency anemia, unspecified: Secondary | ICD-10-CM | POA: Diagnosis not present

## 2021-04-07 DIAGNOSIS — Z7901 Long term (current) use of anticoagulants: Secondary | ICD-10-CM | POA: Diagnosis not present

## 2021-04-07 DIAGNOSIS — N2581 Secondary hyperparathyroidism of renal origin: Secondary | ICD-10-CM | POA: Diagnosis not present

## 2021-04-07 DIAGNOSIS — N186 End stage renal disease: Secondary | ICD-10-CM | POA: Diagnosis not present

## 2021-04-07 DIAGNOSIS — D631 Anemia in chronic kidney disease: Secondary | ICD-10-CM | POA: Diagnosis not present

## 2021-04-07 NOTE — Telephone Encounter (Signed)
-----   Message from Minus Breeding, MD sent at 04/03/2021  8:30 AM EST ----- ?Labs should be OK for right heart cath.   Probably hold warfarin for two days only before the procedure.  Thanks.  ?

## 2021-04-07 NOTE — Telephone Encounter (Signed)
Left a message for the patient to call back.  

## 2021-04-09 ENCOUNTER — Telehealth: Payer: Self-pay | Admitting: *Deleted

## 2021-04-09 DIAGNOSIS — D631 Anemia in chronic kidney disease: Secondary | ICD-10-CM | POA: Diagnosis not present

## 2021-04-09 DIAGNOSIS — D509 Iron deficiency anemia, unspecified: Secondary | ICD-10-CM | POA: Diagnosis not present

## 2021-04-09 DIAGNOSIS — N2581 Secondary hyperparathyroidism of renal origin: Secondary | ICD-10-CM | POA: Diagnosis not present

## 2021-04-09 DIAGNOSIS — N186 End stage renal disease: Secondary | ICD-10-CM | POA: Diagnosis not present

## 2021-04-09 NOTE — Telephone Encounter (Signed)
Right Heart Cath scheduled at [facility] for: Thursday April 10, 2021 7:30 AM ?Arrival time and place: [place] Entrance A  at: 5:30 AM ? ? ?No solid food after midnight prior to cath, clear liquids until 5 AM day of procedure. ? ?Medication instructions: ?-Patient reports she took last dose of coumadin 04/05/21. ?-Except hold medication usual morning medications can be taken with sips of water. ? ?Confirmed patient has responsible adult to drive home post procedure and be with patient first 24 hours after arriving home: ? ?One visitor is allowed to stay in the waiting room during the time you are at the hospital for your procedure.  ? ?Reviewed procedure instructions with patient.  ?Confirmed M-W-F dialysis schedule. ?

## 2021-04-09 NOTE — Telephone Encounter (Signed)
Called pt and discussed CATH and scheduled follow up appt with Dr Percival Spanish. Pt expresses thanks for the call. ?

## 2021-04-09 NOTE — Telephone Encounter (Signed)
Left a message for the patient to call back. ? ?Message has also been released to MyChart  ?

## 2021-04-10 ENCOUNTER — Encounter (HOSPITAL_COMMUNITY): Payer: Self-pay | Admitting: Cardiology

## 2021-04-10 ENCOUNTER — Encounter (HOSPITAL_COMMUNITY)
Admission: RE | Disposition: A | Discharge status: Home / Self Care | Payer: Self-pay | Source: Ambulatory Visit | Attending: Cardiology

## 2021-04-10 ENCOUNTER — Ambulatory Visit (HOSPITAL_COMMUNITY)
Admission: RE | Admit: 2021-04-10 | Discharge: 2021-04-10 | Disposition: A | Discharge status: Home / Self Care | Payer: Medicare HMO | Source: Ambulatory Visit | Attending: Cardiology | Admitting: Cardiology

## 2021-04-10 ENCOUNTER — Other Ambulatory Visit: Payer: Self-pay

## 2021-04-10 DIAGNOSIS — Z992 Dependence on renal dialysis: Secondary | ICD-10-CM | POA: Insufficient documentation

## 2021-04-10 DIAGNOSIS — I120 Hypertensive chronic kidney disease with stage 5 chronic kidney disease or end stage renal disease: Secondary | ICD-10-CM | POA: Diagnosis not present

## 2021-04-10 DIAGNOSIS — I272 Pulmonary hypertension, unspecified: Secondary | ICD-10-CM | POA: Insufficient documentation

## 2021-04-10 DIAGNOSIS — Z79899 Other long term (current) drug therapy: Secondary | ICD-10-CM | POA: Diagnosis not present

## 2021-04-10 DIAGNOSIS — N186 End stage renal disease: Secondary | ICD-10-CM | POA: Diagnosis not present

## 2021-04-10 DIAGNOSIS — Z7901 Long term (current) use of anticoagulants: Secondary | ICD-10-CM | POA: Diagnosis not present

## 2021-04-10 DIAGNOSIS — I4891 Unspecified atrial fibrillation: Secondary | ICD-10-CM | POA: Diagnosis not present

## 2021-04-10 HISTORY — PX: RIGHT HEART CATH: CATH118263

## 2021-04-10 LAB — POCT I-STAT EG7
Acid-Base Excess: 3 mmol/L — ABNORMAL HIGH (ref 0.0–2.0)
Acid-Base Excess: 3 mmol/L — ABNORMAL HIGH (ref 0.0–2.0)
Bicarbonate: 27.7 mmol/L (ref 20.0–28.0)
Bicarbonate: 27.9 mmol/L (ref 20.0–28.0)
Calcium, Ion: 1.26 mmol/L (ref 1.15–1.40)
Calcium, Ion: 1.27 mmol/L (ref 1.15–1.40)
HCT: 34 % — ABNORMAL LOW (ref 36.0–46.0)
HCT: 35 % — ABNORMAL LOW (ref 36.0–46.0)
Hemoglobin: 11.6 g/dL — ABNORMAL LOW (ref 12.0–15.0)
Hemoglobin: 11.9 g/dL — ABNORMAL LOW (ref 12.0–15.0)
O2 Saturation: 74 %
O2 Saturation: 76 %
Potassium: 4.3 mmol/L (ref 3.5–5.1)
Potassium: 4.3 mmol/L (ref 3.5–5.1)
Sodium: 138 mmol/L (ref 135–145)
Sodium: 139 mmol/L (ref 135–145)
TCO2: 29 mmol/L (ref 22–32)
TCO2: 29 mmol/L (ref 22–32)
pCO2, Ven: 43.7 mmHg — ABNORMAL LOW (ref 44–60)
pCO2, Ven: 43.8 mmHg — ABNORMAL LOW (ref 44–60)
pH, Ven: 7.41 (ref 7.25–7.43)
pH, Ven: 7.412 (ref 7.25–7.43)
pO2, Ven: 39 mmHg (ref 32–45)
pO2, Ven: 40 mmHg (ref 32–45)

## 2021-04-10 LAB — PROTIME-INR
INR: 1.1 (ref 0.8–1.2)
Prothrombin Time: 14.3 seconds (ref 11.4–15.2)

## 2021-04-10 SURGERY — RIGHT HEART CATH
Anesthesia: LOCAL

## 2021-04-10 MED ORDER — LIDOCAINE HCL (PF) 1 % IJ SOLN
INTRAMUSCULAR | Status: AC
  Filled 2021-04-10: qty 30

## 2021-04-10 MED ORDER — ONDANSETRON HCL 4 MG/2ML IJ SOLN: 4.0000 mg | Freq: Four times a day (QID) | INTRAMUSCULAR | Status: DC | PRN

## 2021-04-10 MED ORDER — SODIUM CHLORIDE 0.9% FLUSH: 3.0000 mL | Freq: Two times a day (BID) | INTRAVENOUS | Status: DC

## 2021-04-10 MED ORDER — MIDAZOLAM HCL 2 MG/2ML IJ SOLN
INTRAMUSCULAR | Status: AC
  Filled 2021-04-10: qty 2

## 2021-04-10 MED ORDER — LIDOCAINE HCL (PF) 1 % IJ SOLN
INTRAMUSCULAR | Status: DC | PRN
Start: 2021-04-10 — End: 2021-04-10
  Administered 2021-04-10: 2 mL

## 2021-04-10 MED ORDER — SODIUM CHLORIDE 0.9 % IV SOLN: INTRAVENOUS | Status: DC

## 2021-04-10 MED ORDER — ACETAMINOPHEN 325 MG PO TABS: 650.0000 mg | ORAL_TABLET | ORAL | Status: DC | PRN

## 2021-04-10 MED ORDER — SODIUM CHLORIDE 0.9 % IV SOLN: 250.0000 mL | INTRAVENOUS | Status: DC | PRN

## 2021-04-10 MED ORDER — SODIUM CHLORIDE 0.9 % IV SOLN
250.0000 mL | INTRAVENOUS | Status: DC | PRN
Start: 2021-04-10 — End: 2021-04-10

## 2021-04-10 MED ORDER — HYDRALAZINE HCL 20 MG/ML IJ SOLN
INTRAMUSCULAR | Status: DC | PRN
  Administered 2021-04-10 (×2): 10 mg via INTRAVENOUS

## 2021-04-10 MED ORDER — SODIUM CHLORIDE 0.9% FLUSH: 3.0000 mL | INTRAVENOUS | Status: DC | PRN

## 2021-04-10 MED ORDER — HEPARIN (PORCINE) IN NACL 1000-0.9 UT/500ML-% IV SOLN
INTRAVENOUS | Status: AC
  Filled 2021-04-10: qty 1000

## 2021-04-10 MED ORDER — MIDAZOLAM HCL 2 MG/2ML IJ SOLN
INTRAMUSCULAR | Status: DC | PRN
  Administered 2021-04-10: .5 mg via INTRAVENOUS

## 2021-04-10 MED ORDER — LABETALOL HCL 5 MG/ML IV SOLN: 10.0000 mg | INTRAVENOUS | Status: DC | PRN

## 2021-04-10 MED ORDER — HYDRALAZINE HCL 20 MG/ML IJ SOLN
INTRAMUSCULAR | Status: AC
  Filled 2021-04-10: qty 1

## 2021-04-10 MED ORDER — HYDRALAZINE HCL 20 MG/ML IJ SOLN: 10.0000 mg | INTRAMUSCULAR | Status: DC | PRN

## 2021-04-10 MED ORDER — HEPARIN (PORCINE) IN NACL 1000-0.9 UT/500ML-% IV SOLN
INTRAVENOUS | Status: DC | PRN
  Administered 2021-04-10: 500 mL

## 2021-04-10 SURGICAL SUPPLY — 6 items
CATH BALLN WEDGE 5F 110CM (CATHETERS) ×1 IMPLANT
GUIDEWIRE .025 260CM (WIRE) ×1 IMPLANT
KIT HEART LEFT (KITS) ×3 IMPLANT
PACK CARDIAC CATHETERIZATION (CUSTOM PROCEDURE TRAY) ×3 IMPLANT
SHEATH GLIDE SLENDER 4/5FR (SHEATH) ×1 IMPLANT
TRANSDUCER W/STOPCOCK (MISCELLANEOUS) ×3 IMPLANT

## 2021-04-10 NOTE — Interval H&P Note (Signed)
History and Physical Interval Note: ? ?04/10/2021 ?7:38 AM ? ?EULINE KIMBLER  has presented today for surgery, with the diagnosis of hp.  The various methods of treatment have been discussed with the patient and family. After consideration of risks, benefits and other options for treatment, the patient has consented to  Procedure(s): ?RIGHT HEART CATH (N/A) as a surgical intervention.  The patient's history has been reviewed, patient examined, no change in status, stable for surgery.  I have reviewed the patient's chart and labs.  Questions were answered to the patient's satisfaction.   ? ? ?Kaushik Maul Aundra Dubin ? ? ?

## 2021-04-11 DIAGNOSIS — D509 Iron deficiency anemia, unspecified: Secondary | ICD-10-CM | POA: Diagnosis not present

## 2021-04-11 DIAGNOSIS — N2581 Secondary hyperparathyroidism of renal origin: Secondary | ICD-10-CM | POA: Diagnosis not present

## 2021-04-11 DIAGNOSIS — N186 End stage renal disease: Secondary | ICD-10-CM | POA: Diagnosis not present

## 2021-04-11 DIAGNOSIS — D631 Anemia in chronic kidney disease: Secondary | ICD-10-CM | POA: Diagnosis not present

## 2021-04-14 DIAGNOSIS — D631 Anemia in chronic kidney disease: Secondary | ICD-10-CM | POA: Diagnosis not present

## 2021-04-14 DIAGNOSIS — D509 Iron deficiency anemia, unspecified: Secondary | ICD-10-CM | POA: Diagnosis not present

## 2021-04-14 DIAGNOSIS — N2581 Secondary hyperparathyroidism of renal origin: Secondary | ICD-10-CM | POA: Diagnosis not present

## 2021-04-14 DIAGNOSIS — Z7901 Long term (current) use of anticoagulants: Secondary | ICD-10-CM | POA: Diagnosis not present

## 2021-04-14 DIAGNOSIS — N186 End stage renal disease: Secondary | ICD-10-CM | POA: Diagnosis not present

## 2021-04-16 DIAGNOSIS — D631 Anemia in chronic kidney disease: Secondary | ICD-10-CM | POA: Diagnosis not present

## 2021-04-16 DIAGNOSIS — N186 End stage renal disease: Secondary | ICD-10-CM | POA: Diagnosis not present

## 2021-04-16 DIAGNOSIS — D509 Iron deficiency anemia, unspecified: Secondary | ICD-10-CM | POA: Diagnosis not present

## 2021-04-16 DIAGNOSIS — N2581 Secondary hyperparathyroidism of renal origin: Secondary | ICD-10-CM | POA: Diagnosis not present

## 2021-04-18 DIAGNOSIS — D509 Iron deficiency anemia, unspecified: Secondary | ICD-10-CM | POA: Diagnosis not present

## 2021-04-18 DIAGNOSIS — N2581 Secondary hyperparathyroidism of renal origin: Secondary | ICD-10-CM | POA: Diagnosis not present

## 2021-04-18 DIAGNOSIS — N186 End stage renal disease: Secondary | ICD-10-CM | POA: Diagnosis not present

## 2021-04-18 DIAGNOSIS — D631 Anemia in chronic kidney disease: Secondary | ICD-10-CM | POA: Diagnosis not present

## 2021-04-21 DIAGNOSIS — Z7901 Long term (current) use of anticoagulants: Secondary | ICD-10-CM | POA: Diagnosis not present

## 2021-04-21 DIAGNOSIS — N2581 Secondary hyperparathyroidism of renal origin: Secondary | ICD-10-CM | POA: Diagnosis not present

## 2021-04-21 DIAGNOSIS — D631 Anemia in chronic kidney disease: Secondary | ICD-10-CM | POA: Diagnosis not present

## 2021-04-21 DIAGNOSIS — D509 Iron deficiency anemia, unspecified: Secondary | ICD-10-CM | POA: Diagnosis not present

## 2021-04-21 DIAGNOSIS — N186 End stage renal disease: Secondary | ICD-10-CM | POA: Diagnosis not present

## 2021-04-23 DIAGNOSIS — N186 End stage renal disease: Secondary | ICD-10-CM | POA: Diagnosis not present

## 2021-04-23 DIAGNOSIS — N2581 Secondary hyperparathyroidism of renal origin: Secondary | ICD-10-CM | POA: Diagnosis not present

## 2021-04-23 DIAGNOSIS — D509 Iron deficiency anemia, unspecified: Secondary | ICD-10-CM | POA: Diagnosis not present

## 2021-04-23 DIAGNOSIS — D631 Anemia in chronic kidney disease: Secondary | ICD-10-CM | POA: Diagnosis not present

## 2021-04-24 DIAGNOSIS — H53022 Refractive amblyopia, left eye: Secondary | ICD-10-CM | POA: Diagnosis not present

## 2021-04-24 DIAGNOSIS — H26491 Other secondary cataract, right eye: Secondary | ICD-10-CM | POA: Diagnosis not present

## 2021-04-24 DIAGNOSIS — H18413 Arcus senilis, bilateral: Secondary | ICD-10-CM | POA: Diagnosis not present

## 2021-04-24 DIAGNOSIS — Z961 Presence of intraocular lens: Secondary | ICD-10-CM | POA: Diagnosis not present

## 2021-04-24 DIAGNOSIS — H26493 Other secondary cataract, bilateral: Secondary | ICD-10-CM | POA: Diagnosis not present

## 2021-04-25 DIAGNOSIS — N186 End stage renal disease: Secondary | ICD-10-CM | POA: Diagnosis not present

## 2021-04-25 DIAGNOSIS — N2581 Secondary hyperparathyroidism of renal origin: Secondary | ICD-10-CM | POA: Diagnosis not present

## 2021-04-25 DIAGNOSIS — D509 Iron deficiency anemia, unspecified: Secondary | ICD-10-CM | POA: Diagnosis not present

## 2021-04-25 DIAGNOSIS — D631 Anemia in chronic kidney disease: Secondary | ICD-10-CM | POA: Diagnosis not present

## 2021-04-28 DIAGNOSIS — N2581 Secondary hyperparathyroidism of renal origin: Secondary | ICD-10-CM | POA: Diagnosis not present

## 2021-04-28 DIAGNOSIS — D631 Anemia in chronic kidney disease: Secondary | ICD-10-CM | POA: Diagnosis not present

## 2021-04-28 DIAGNOSIS — N186 End stage renal disease: Secondary | ICD-10-CM | POA: Diagnosis not present

## 2021-04-28 DIAGNOSIS — Z7901 Long term (current) use of anticoagulants: Secondary | ICD-10-CM | POA: Diagnosis not present

## 2021-04-28 DIAGNOSIS — D509 Iron deficiency anemia, unspecified: Secondary | ICD-10-CM | POA: Diagnosis not present

## 2021-04-29 DIAGNOSIS — B078 Other viral warts: Secondary | ICD-10-CM | POA: Diagnosis not present

## 2021-04-29 DIAGNOSIS — D485 Neoplasm of uncertain behavior of skin: Secondary | ICD-10-CM | POA: Diagnosis not present

## 2021-04-29 DIAGNOSIS — L821 Other seborrheic keratosis: Secondary | ICD-10-CM | POA: Diagnosis not present

## 2021-04-29 DIAGNOSIS — D2261 Melanocytic nevi of right upper limb, including shoulder: Secondary | ICD-10-CM | POA: Diagnosis not present

## 2021-04-29 DIAGNOSIS — D2262 Melanocytic nevi of left upper limb, including shoulder: Secondary | ICD-10-CM | POA: Diagnosis not present

## 2021-04-30 DIAGNOSIS — N2581 Secondary hyperparathyroidism of renal origin: Secondary | ICD-10-CM | POA: Diagnosis not present

## 2021-04-30 DIAGNOSIS — D509 Iron deficiency anemia, unspecified: Secondary | ICD-10-CM | POA: Diagnosis not present

## 2021-04-30 DIAGNOSIS — D631 Anemia in chronic kidney disease: Secondary | ICD-10-CM | POA: Diagnosis not present

## 2021-04-30 DIAGNOSIS — N186 End stage renal disease: Secondary | ICD-10-CM | POA: Diagnosis not present

## 2021-04-30 NOTE — Progress Notes (Signed)
To have       ?  ?Cardiology Office Note ? ? ?Date:  05/01/2021  ? ?ID:  Jillian Hayes, DOB 1945-11-27, MRN [id number] ? ?PCP:  [name], MD  ?Cardiologist:   Minus Breeding, MD  ?Referring:  [name], MD ?  ?Chief Complaint  ?Patient presents with  ? Atrial Fibrillation  ? ? ?  ?History of Present Illness: ?Jillian Hayes is a 76 y.o. female who presents for follow up of HTN.   She had an abnormal EKG and borderline POET (Plain Old Exercise Treadmill).  I sent her for a perfusion study which was negative for ischemia.  She was admitted 9/23-9/25/2019 for atrial fibrillation, RVR.  She spontaneously converted to sinus rhythm and was started on Cardizem and Coumadin.  Echo showed mod MR and AI.   She had a right heart cath with borderline pulmonary HTN.    ? ?She actually has now already been listed for her kidney and actually was offered 1 last week.  She turned down because it was not rated highly on the donor scale.  The patient denies any new symptoms such as chest discomfort, neck or arm discomfort. There has been no new shortness of breath, PND or orthopnea. There have been no reported palpitations, presyncope or syncope.  ?  ?Past Medical History:  ?Diagnosis Date  ? Advanced care planning/counseling discussion 06/10/2014  ? 05/31/2014 patient presents copy of HCP and Living Will  ? Anemia   ? iron deficiency  ? Anxiety   ? Arthritis   ? "in hands"  ? Atrial fibrillation ([place])   ? Benign fundic gland polyps of stomach   ? Bladder polyps 06/25/2010  ? Chronic headaches 06/24/2010  ? Complication of anesthesia   ? "woke up at the end of a cyst removal surgery in 1991"  ? Depression 1991  ? hospitalized  ? Diverticulosis   ? ESRD (end stage renal disease) ([place]) 11/09/2015  ? HD on MWF  ? External prolapsed hemorrhoids   ? History of chicken pox 06/25/2010  ? Hypercalcemia 02/18/2014  ? Hypertension   ? Insomnia 06/24/2010  ? Multiple chemical sensitivity syndrome 06/25/2010  ? Proteinuria 02/18/2014   ? Renal insufficiency 03/26/2011  ? Valvular heart disease 04/28/2016  ? ? ?Past Surgical History:  ?Procedure Laterality Date  ? AV FISTULA PLACEMENT Left   ? x4  ? COLONOSCOPY  2014  ? cyst on left breast removed Left 1991  ? benign  ? ESOPHAGOGASTRODUODENOSCOPY (EGD) WITH ESOPHAGEAL DILATION  2014  ? LAPAROSCOPIC BILATERAL SALPINGO OOPHERECTOMY Bilateral 08/20/2020  ? Procedure: LAPAROSCOPIC BILATERAL SALPINGO OOPHORECTOMY;  Surgeon: Megan Salon, MD;  Location: [facility];  Service: Gynecology;  Laterality: Bilateral;  ? NASAL SEPTUM SURGERY  1986  ? rhinoplasty  ? polyps on bladder removed  1972  ? benign  ? RIGHT HEART CATH N/A 04/10/2021  ? Procedure: RIGHT HEART CATH;  Surgeon: Larey Dresser, MD;  Location: [facility];  Service: Cardiovascular;  Laterality: N/A;  ? TONSILLECTOMY  1962  ? ? ? ?Current Outpatient Medications  ?Medication Sig Dispense Refill  ? acetaminophen (TYLENOL) 500 MG tablet Take 500 mg by mouth every 6 (six) hours as needed for headache (pain).    ? Alfalfa 500 MG TABS Take 500 mg by mouth 3 (three) times daily.    ? atorvastatin (LIPITOR) 20 MG tablet Take 20 mg by mouth at bedtime.    ? carvedilol (COREG) 25 MG tablet Take  1 tablet by mouth twice daily 180 tablet 3  ? cinacalcet (SENSIPAR) 30 MG tablet Take 30 mg by mouth at bedtime.    ? diltiazem (CARDIZEM CD) 240 MG 24 hr capsule Take 240 mg by mouth daily with lunch.    ? Flaxseed, Linseed, (FLAX SEED OIL) 1000 MG CAPS Take 1,000 mg by mouth every evening.    ? hydrALAZINE (APRESOLINE) 50 MG tablet Take 75 mg by mouth 2 (two) times daily.    ? L-Glutamine 500 MG CAPS Take 500 mg by mouth every evening.    ? lidocaine-prilocaine (EMLA) cream Apply 1 application. topically every Monday, Wednesday, and Friday with hemodialysis.    ? OVER THE COUNTER MEDICATION Take 3 capsules by mouth every evening. Shaklee Herb-Lax Natural Laxative for Colon Cleanse ([place]enna, Licorice, and Alfalfa)    ? Probiotic Product (PROBIOTIC DAILY PO)  Take 1 capsule by mouth in the morning.    ? sevelamer carbonate (RENVELA) 800 MG tablet Take 2,400 mg by mouth See admin instructions. Take 3 tablets (2400 mg) by mouth with each meal & take 2 tablets (1600 mg) by mouth with each large snack    ? vitamin C (ASCORBIC ACID) 500 MG tablet Take 500 mg by mouth daily.    ? Vitamin E 400 units TABS Take 400 Units by mouth daily with lunch.    ? warfarin (COUMADIN) 1 MG tablet Take 1 mg by mouth every Tuesday, Thursday, and Saturday at 6 PM.    ? warfarin (COUMADIN) 5 MG tablet Take 1 tablet (5 mg total) by mouth daily at 4 PM. ONCE DAILY AS DIRECTED (Patient taking differently: Take 5 mg by mouth every evening.) 30 tablet 0  ? famotidine (PEPCID) 10 MG tablet Take 1 tablet (10 mg total) by mouth 2 (two) times daily. (Patient not taking: Reported on 05/01/2021) 30 tablet 1  ? fluticasone (FLONASE) 50 MCG/ACT nasal spray Place 2 sprays into both nostrils daily. (Patient not taking: Reported on 05/01/2021) 16 g 1  ? ?Current Facility-Administered Medications  ?Medication Dose Route Frequency Provider Last Rate Last Admin  ? triamcinolone acetonide (KENALOG-40) injection 40 mg  40 mg Intra-articular Once Rosemarie Ax, MD      ? ? ?Allergies:   Sulfa antibiotics and Clonidine derivatives  ? ? ?ROS:  Please see the history of present illness.   Otherwise, review of systems are positive for none.   All other systems are reviewed and negative.  ? ? ?PHYSICAL EXAM: ?VS:  BP (!) 120/54   Pulse 76   Ht '5\' 6"'$  (1.676 m)   Wt 130 lb (59 kg)   SpO2 98%   BMI 20.98 kg/m?  , BMI Body mass index is 20.98 kg/m?. ?GENERAL:  Well appearing ?NECK:  No jugular venous distention, waveform within normal limits, carotid upstroke brisk and symmetric, no bruits, no thyromegaly ?LUNGS:  Clear to auscultation bilaterally ?CHEST:  Unremarkable ?HEART:  PMI not displaced or sustained,S1 and S2 within normal limits, no S3, no S4, no clicks, no rubs, soft apical 2 out of 6 systolic murmur  radiating slightly at aortic outflow tract, 2 out of 6 diastolic murmur murmurs ?ABD:  Flat, positive bowel sounds normal in frequency in pitch, no bruits, no rebound, no guarding, no midline pulsatile mass, no hepatomegaly, no splenomegaly ?EXT:  2 plus pulses throughout, no edema, no cyanosis no clubbing left upper arm thrill and bruit from fistula ? ? ? ? ?EKG:  EKG is  ordered today. ?Sinus rhythm, rate  76, premature atrial contractions, no acute ST-T wave changes.  ? ?Recent Labs: ?08/15/2020: ALT 20 ?03/06/2021: TSH 0.09 ?04/01/2021: BUN 35; Creatinine, Ser 5.16; Platelets 212 ?04/10/2021: Hemoglobin 11.6; Hemoglobin 11.9; Potassium 4.3; Potassium 4.3; Sodium 138; Sodium 139  ? ? ?Lipid Panel ?   ?Component Value Date/Time  ? CHOL 141 03/06/2021 1533  ? TRIG 95.0 03/06/2021 1533  ? HDL 94.80 03/06/2021 1533  ? CHOLHDL 1 03/06/2021 1533  ? VLDL 19.0 03/06/2021 1533  ? [place] 27 03/06/2021 1533  ? ?  ? ?Wt Readings from Last 3 Encounters:  ?05/01/21 130 lb (59 kg)  ?04/10/21 132 lb (59.9 kg)  ?04/01/21 128 lb (58.1 kg)  ?  ? ? ?Other studies Reviewed: ?Additional studies/ records that were reviewed today include:   Right heart catheterization ?Review of the above records demonstrates: See elsewhere ? ? ?ASSESSMENT AND PLAN: ? ?ATRIAL FIB:   Jillian Hayes has a CHA2DS2 - VASc score of 3.  She has had no symptomatic paroxysms and tolerates anticoagulation.  No change in therapy.  ? ?HTN:  The blood pressure is at target.  No change in therapy.  ? ?MILD/MODERATE AI:   This was mild to moderate on echo Jan 2023.  I will follow this clinically and with repeat echoes in a couple of years.  ?s again clinically. ? ?PULMONARY HTN: I think this is probably class II.   It was very mild.  No change in therapy was suggested.    ? ?Current medicines are reviewed at length with the patient today.  The patient does not have concerns regarding medicines. ? ?The following changes have been made: None ? ?Labs/ tests ordered today  include:    ? ?Orders Placed This Encounter  ?Procedures  ? EKG 12-Lead  ? ? ? ?Disposition:   FU with me in 12 months.  ? ? ?Signed, ?Minus Breeding, MD  ?05/01/2021 3:35 PM    ?[place] ?

## 2021-05-01 ENCOUNTER — Encounter: Payer: Self-pay | Admitting: Cardiology

## 2021-05-01 ENCOUNTER — Ambulatory Visit: Payer: Medicare HMO | Admitting: Cardiology

## 2021-05-01 VITALS — BP 120/54 | HR 76 | Ht 66.0 in | Wt 130.0 lb

## 2021-05-01 DIAGNOSIS — I10 Essential (primary) hypertension: Secondary | ICD-10-CM

## 2021-05-01 DIAGNOSIS — I480 Paroxysmal atrial fibrillation: Secondary | ICD-10-CM

## 2021-05-01 DIAGNOSIS — I272 Pulmonary hypertension, unspecified: Secondary | ICD-10-CM

## 2021-05-01 DIAGNOSIS — H524 Presbyopia: Secondary | ICD-10-CM | POA: Diagnosis not present

## 2021-05-01 DIAGNOSIS — I351 Nonrheumatic aortic (valve) insufficiency: Secondary | ICD-10-CM

## 2021-05-01 DIAGNOSIS — H5213 Myopia, bilateral: Secondary | ICD-10-CM | POA: Diagnosis not present

## 2021-05-01 DIAGNOSIS — Z9841 Cataract extraction status, right eye: Secondary | ICD-10-CM | POA: Diagnosis not present

## 2021-05-01 DIAGNOSIS — Z961 Presence of intraocular lens: Secondary | ICD-10-CM | POA: Diagnosis not present

## 2021-05-01 DIAGNOSIS — H52222 Regular astigmatism, left eye: Secondary | ICD-10-CM | POA: Diagnosis not present

## 2021-05-01 NOTE — Patient Instructions (Signed)
Medication Instructions:  ?No changes ?*If you need a refill on your cardiac medications before your next appointment, please call your pharmacy* ? ? ?Lab Work: ?None ordered ?If you have labs (blood work) drawn today and your tests are completely normal, you will receive your results only by: ?MyChart Message (if you have MyChart) OR ?A paper copy in the mail ?If you have any lab test that is abnormal or we need to change your treatment, we will call you to review the results. ? ? ?Testing/Procedures: ?None ordered ? ? ?Follow-Up: ?At [facility], you and your health needs are our priority.  As part of our continuing mission to provide you with exceptional heart care, we have created designated Provider Care Teams.  These Care Teams include your primary Cardiologist (physician) and Advanced Practice Providers (APPs -  Physician Assistants and Nurse Practitioners) who all work together to provide you with the care you need, when you need it. ? ?We recommend signing up for the patient portal called "MyChart".  Sign up information is provided on this After Visit Summary.  MyChart is used to connect with patients for Virtual Visits (Telemedicine).  Patients are able to view lab/test results, encounter notes, upcoming appointments, etc.  Non-urgent messages can be sent to your provider as well.   ?To learn more about what you can do with MyChart, go to [email or web address].   ? ?Your next appointment:   ?12 month(s) ? ?The format for your next appointment:   ?In Person ? ?Provider:   ?James Hochrein, MD { ? ? ?

## 2021-05-02 ENCOUNTER — Encounter: Payer: Self-pay | Admitting: Family Medicine

## 2021-05-02 ENCOUNTER — Ambulatory Visit: Payer: Self-pay

## 2021-05-02 ENCOUNTER — Ambulatory Visit: Payer: Medicare HMO | Admitting: Family Medicine

## 2021-05-02 VITALS — BP 130/60 | Ht 66.0 in | Wt 130.0 lb

## 2021-05-02 DIAGNOSIS — N2581 Secondary hyperparathyroidism of renal origin: Secondary | ICD-10-CM | POA: Diagnosis not present

## 2021-05-02 DIAGNOSIS — N186 End stage renal disease: Secondary | ICD-10-CM | POA: Diagnosis not present

## 2021-05-02 DIAGNOSIS — M1711 Unilateral primary osteoarthritis, right knee: Secondary | ICD-10-CM

## 2021-05-02 DIAGNOSIS — D631 Anemia in chronic kidney disease: Secondary | ICD-10-CM | POA: Diagnosis not present

## 2021-05-02 DIAGNOSIS — D509 Iron deficiency anemia, unspecified: Secondary | ICD-10-CM | POA: Diagnosis not present

## 2021-05-02 DIAGNOSIS — Z992 Dependence on renal dialysis: Secondary | ICD-10-CM | POA: Diagnosis not present

## 2021-05-02 MED ORDER — KETOROLAC TROMETHAMINE 30 MG/ML IJ SOLN
30.0000 mg | Freq: Once | INTRAMUSCULAR | Status: AC
  Administered 2021-05-02: 30 mg via INTRA_ARTICULAR

## 2021-05-02 NOTE — Progress Notes (Signed)
?Jillian Hayes - 77 y.o. female MRN [id number]  Date of birth: Dec 07, 1945 ? ?SUBJECTIVE:  Including CC & ROS.  ?No chief complaint on file. ? ? ?Jillian Hayes is a 76 y.o. female that is presenting with acute worsening of her right knee pain.  She did well with the previous aspiration and injection.  Denies any injury or inciting event.  She noticed stiffness in the knee for about a year prior. ? ? ? ?Review of Systems ?See HPI  ? ?HISTORY: Past Medical, Surgical, Social, and Family History Reviewed & Updated per EMR.   ?Pertinent Historical Findings include: ? ?Past Medical History:  ?Diagnosis Date  ? Advanced care planning/counseling discussion 06/10/2014  ? 05/31/2014 patient presents copy of HCP and Living Will  ? Anemia   ? iron deficiency  ? Anxiety   ? Arthritis   ? "in hands"  ? Atrial fibrillation ([place])   ? Benign fundic gland polyps of stomach   ? Bladder polyps 06/25/2010  ? Chronic headaches 06/24/2010  ? Complication of anesthesia   ? "woke up at the end of a cyst removal surgery in 1991"  ? Depression 1991  ? hospitalized  ? Diverticulosis   ? ESRD (end stage renal disease) ([place]) 11/09/2015  ? HD on MWF  ? External prolapsed hemorrhoids   ? History of chicken pox 06/25/2010  ? Hypercalcemia 02/18/2014  ? Hypertension   ? Insomnia 06/24/2010  ? Multiple chemical sensitivity syndrome 06/25/2010  ? Proteinuria 02/18/2014  ? Renal insufficiency 03/26/2011  ? Valvular heart disease 04/28/2016  ? ? ?Past Surgical History:  ?Procedure Laterality Date  ? AV FISTULA PLACEMENT Left   ? x4  ? COLONOSCOPY  2014  ? cyst on left breast removed Left 1991  ? benign  ? ESOPHAGOGASTRODUODENOSCOPY (EGD) WITH ESOPHAGEAL DILATION  2014  ? LAPAROSCOPIC BILATERAL SALPINGO OOPHERECTOMY Bilateral 08/20/2020  ? Procedure: LAPAROSCOPIC BILATERAL SALPINGO OOPHORECTOMY;  Surgeon: Megan Salon, MD;  Location: [facility];  Service: Gynecology;  Laterality: Bilateral;  ? NASAL SEPTUM SURGERY  1986  ? rhinoplasty  ? polyps on bladder  removed  1972  ? benign  ? RIGHT HEART CATH N/A 04/10/2021  ? Procedure: RIGHT HEART CATH;  Surgeon: Larey Dresser, MD;  Location: [facility];  Service: Cardiovascular;  Laterality: N/A;  ? TONSILLECTOMY  1962  ? ? ? ?PHYSICAL EXAM:  ?VS: BP 130/60 (BP Location: Right Arm, Patient Position: Sitting)   Ht '5\' 6"'$  (1.676 m)   Wt 130 lb (59 kg)   BMI 20.98 kg/m?  ?Physical Exam ?Gen: NAD, alert, cooperative with exam, well-appearing ?MSK:  ?Neurovascularly intact   ? ? ?Aspiration/Injection Procedure Note ?[name] ?Dec 08, 1945 ? ?Procedure: Aspiration and Injection ?Indications: Right knee pain ? ?Procedure Details ?Consent: Risks of procedure as well as the alternatives and risks of each were explained to the (patient/caregiver).  Consent for procedure obtained. ?Time Out: Verified patient identification, verified procedure, site/side was marked, verified correct patient position, special equipment/implants available, medications/allergies/relevent history reviewed, required imaging and test results available.  Performed.  The area was cleaned with iodine and alcohol swabs.   ? ?The right knee superior lateral suprapatellar pouch was injected using 3 cc of 1% lidocaine on a 25-gauge 1-1/2 inch needle.  An 18-gauge 1 and 1 officiated was used to achieve aspiration.  The syringe was switched to mixture containing 1 cc's of 30 mg Toradol and 4 cc's of 0.25% bupivacaine was injected.  Ultrasound was used. Images  were obtained in long views showing the injection.   ? ?Amount of Fluid Aspirated:  16m ?Character of Fluid: clear and straw colored ?Fluid was sent for: n/a ? ?A sterile dressing was applied. ? ?Patient did tolerate procedure well. ? ? ? ? ?ASSESSMENT & PLAN:  ? ?OA (osteoarthritis) of knee ?Acute on chronic in nature.  Having effusion with limited range of motion. ?-Counseled on home exercise therapy and supportive care. ?-Aspiration injection today. ?-Proceed gel injection. ?-Could consider  Zilretta going forward. ? ? ? ? ?

## 2021-05-02 NOTE — Assessment & Plan Note (Signed)
Acute on chronic in nature.  Having effusion with limited range of motion. ?-Counseled on home exercise therapy and supportive care. ?-Aspiration injection today. ?-Proceed gel injection. ?-Could consider Zilretta going forward. ?

## 2021-05-02 NOTE — Patient Instructions (Signed)
Good to see you ?Please use ice as needed   ?Please send me a message in MyChart with any questions or updates.  ?We will call when the gel injection is in.  ? ?--Dr. Raeford Razor ? ?

## 2021-05-05 DIAGNOSIS — Z7901 Long term (current) use of anticoagulants: Secondary | ICD-10-CM | POA: Diagnosis not present

## 2021-05-05 DIAGNOSIS — N186 End stage renal disease: Secondary | ICD-10-CM | POA: Diagnosis not present

## 2021-05-05 DIAGNOSIS — D631 Anemia in chronic kidney disease: Secondary | ICD-10-CM | POA: Diagnosis not present

## 2021-05-05 DIAGNOSIS — D509 Iron deficiency anemia, unspecified: Secondary | ICD-10-CM | POA: Diagnosis not present

## 2021-05-05 DIAGNOSIS — N2581 Secondary hyperparathyroidism of renal origin: Secondary | ICD-10-CM | POA: Diagnosis not present

## 2021-05-07 DIAGNOSIS — D631 Anemia in chronic kidney disease: Secondary | ICD-10-CM | POA: Diagnosis not present

## 2021-05-07 DIAGNOSIS — N186 End stage renal disease: Secondary | ICD-10-CM | POA: Diagnosis not present

## 2021-05-07 DIAGNOSIS — N2581 Secondary hyperparathyroidism of renal origin: Secondary | ICD-10-CM | POA: Diagnosis not present

## 2021-05-07 DIAGNOSIS — D509 Iron deficiency anemia, unspecified: Secondary | ICD-10-CM | POA: Diagnosis not present

## 2021-05-09 DIAGNOSIS — D631 Anemia in chronic kidney disease: Secondary | ICD-10-CM | POA: Diagnosis not present

## 2021-05-09 DIAGNOSIS — D509 Iron deficiency anemia, unspecified: Secondary | ICD-10-CM | POA: Diagnosis not present

## 2021-05-09 DIAGNOSIS — N2581 Secondary hyperparathyroidism of renal origin: Secondary | ICD-10-CM | POA: Diagnosis not present

## 2021-05-09 DIAGNOSIS — N186 End stage renal disease: Secondary | ICD-10-CM | POA: Diagnosis not present

## 2021-05-12 ENCOUNTER — Telehealth: Payer: Self-pay | Admitting: *Deleted

## 2021-05-12 DIAGNOSIS — Z7901 Long term (current) use of anticoagulants: Secondary | ICD-10-CM | POA: Diagnosis not present

## 2021-05-12 DIAGNOSIS — D631 Anemia in chronic kidney disease: Secondary | ICD-10-CM | POA: Diagnosis not present

## 2021-05-12 DIAGNOSIS — D509 Iron deficiency anemia, unspecified: Secondary | ICD-10-CM | POA: Diagnosis not present

## 2021-05-12 DIAGNOSIS — N186 End stage renal disease: Secondary | ICD-10-CM | POA: Diagnosis not present

## 2021-05-12 DIAGNOSIS — N2581 Secondary hyperparathyroidism of renal origin: Secondary | ICD-10-CM | POA: Diagnosis not present

## 2021-05-12 NOTE — Telephone Encounter (Signed)
Left message for patient to call back re: SOB for Durolane gel injection for her right knee.  ? ?[employer] [employer] SOB states: Durolane is covered at 80%. The procedure 20610-20611 is covered at 100 %. Patient has a $25 copay.  ?

## 2021-05-13 NOTE — Telephone Encounter (Signed)
Pt informed of below.  She states she has talked with several people who have had the HA gel injections and they said they did not get good, lasting relieve. She feels the gel injections are not a good investment and does not want to proceed with Durolane.  ?She wants to know what other alternative she has. Please advise.  ?

## 2021-05-13 NOTE — Telephone Encounter (Signed)
Will hold off on gel injection and pursue zilretta for her knee oa.  ? ?Rosemarie Ax, MD ?[facility] ?05/13/2021, 5:04 PM ? ?

## 2021-05-13 NOTE — Telephone Encounter (Signed)
Zilretta benefit investigation initiated via [employer].  ?

## 2021-05-14 ENCOUNTER — Ambulatory Visit: Payer: Medicare HMO | Admitting: Family Medicine

## 2021-05-14 VITALS — BP 177/53 | Ht 66.0 in | Wt 132.0 lb

## 2021-05-14 DIAGNOSIS — M1711 Unilateral primary osteoarthritis, right knee: Secondary | ICD-10-CM

## 2021-05-14 NOTE — Patient Instructions (Addendum)
Thank you for coming to see me today. It was a pleasure. Today we talked about:  ? ?We drained 70 mL off your knee today.   ? ?Use ice, compression sleeve, and try voltaren gel that you can get over the counter. ? ?Please follow-up with Dr. Raeford Razor as needed. ? ?If you have any questions or concerns, please do not hesitate to call the office at [phone]. ? ?Best,  ? ?Arizona Constable, DO ?[place]  ?

## 2021-05-14 NOTE — Progress Notes (Signed)
? ?  Jillian Hayes is a 76 y.o. female who presents to [facility] today for the following: ? ?Right knee pain and swelling ?Patient has a history of arthritis and recurrent effusions, last drained on 3/31 by Dr. Raeford Razor, draining 60 cc ?She reports that over the last few days she has noticed worsening pain and swelling in the knee ?She is currently working to get approved for [employer] ?Reports that she is currently on a blood thinner and also on dialysis ?Currently in her normal state of health otherwise ?She is hoping to have some of the fluid drained off of her knee to help with her pain ?She has been taking Tylenol as needed ?She also intermittently uses compression ? ? ?PMH reviewed.  ?ROS as above. ?Medications reviewed. ? ?Exam:  ?BP (!) 177/53   Ht '5\' 6"'$  (1.676 m)   Wt 132 lb (59.9 kg)   BMI 21.31 kg/m?  ?Gen: Well NAD ?MSK: ? ?Right Knee: ?- Inspection: Obvious effusion of the right knee.  No erythema or bruising b/l. Skin intact ?- Palpation: Mild tenderness palpation mostly over the medial joint line.  She has a palpable moderate to large effusion within the suprapatellar pouch ?- ROM: Flexion extension of the knee limited on the right secondary to the effusion ?- Strength: 5/5 strength b/l ?- Neuro/vasc: NV intact distally b/l ? ? ?No results found. ? ? ?Assessment and Plan: ?1) Primary osteoarthritis of right knee ?Patient has obvious effusion on examination.  Discussed that this is likely related to her arthritis.  Given that she is under the care of Dr. Raeford Razor currently and working to be approved for [employer], would hold off on any further injection at this time.  70 cc of normal joint fluid was aspirated from her suprapatellar pouch and she found improvement in her pain afterwards.  Recommend continued compression, icing, and trial of Voltaren gel.  She can follow-up here as needed, can follow-up with Dr. Raeford Razor over the next few weeks. ? ?Right Knee ultrasound-guided intraarticular aspiration:    ? ?Consent obtained ?Time-out called ?Suprapatellar pouch visualized under ultrasound in transverse axis and marked.  After sterile prep with chlorhexadine, injected 3cc of 1% lidocaine without epinephrine using a 25 gauge 1.5 in needle for anesthetic.  Then, using an 18 gauge 1.5 inch needle, 70 cc of clear yellow synovial fluid was aspirated from the suprapatellar pouch under ultrasound guidance with good visualization of needle tip.    Patient tolerated the procedure well.  ? ? ?Jillian Hayes, D.O.  ?PGY-4  Sports Medicine  ?05/14/2021 3:00 PM ? ?I was the preceptor for this visit and available for immediate consultation ?Shellia Cleverly, DO ?

## 2021-05-14 NOTE — Assessment & Plan Note (Signed)
Patient has obvious effusion on examination.  Discussed that this is likely related to her arthritis.  Given that she is under the care of Dr. Raeford Razor currently and working to be approved for [employer], would hold off on any further injection at this time.  70 cc of normal joint fluid was aspirated from her suprapatellar pouch and she found improvement in her pain afterwards.  Recommend continued compression, icing, and trial of Voltaren gel.  She can follow-up here as needed, can follow-up with Dr. Raeford Razor over the next few weeks. ? ?Right Knee ultrasound-guided intraarticular aspiration:   ? ?Consent obtained ?Time-out called ?Suprapatellar pouch visualized under ultrasound in transverse axis and marked.  After sterile prep with chlorhexadine, injected 3cc of 1% lidocaine without epinephrine using a 25 gauge 1.5 in needle for anesthetic.  Then, using an 18 gauge 1.5 inch needle, 70 cc of clear yellow synovial fluid was aspirated from the suprapatellar pouch under ultrasound guidance with good visualization of needle tip.    Patient tolerated the procedure well.  ?

## 2021-05-16 DIAGNOSIS — N186 End stage renal disease: Secondary | ICD-10-CM | POA: Diagnosis not present

## 2021-05-16 DIAGNOSIS — D509 Iron deficiency anemia, unspecified: Secondary | ICD-10-CM | POA: Diagnosis not present

## 2021-05-16 DIAGNOSIS — N2581 Secondary hyperparathyroidism of renal origin: Secondary | ICD-10-CM | POA: Diagnosis not present

## 2021-05-16 DIAGNOSIS — D631 Anemia in chronic kidney disease: Secondary | ICD-10-CM | POA: Diagnosis not present

## 2021-05-19 DIAGNOSIS — D509 Iron deficiency anemia, unspecified: Secondary | ICD-10-CM | POA: Diagnosis not present

## 2021-05-19 DIAGNOSIS — N2581 Secondary hyperparathyroidism of renal origin: Secondary | ICD-10-CM | POA: Diagnosis not present

## 2021-05-19 DIAGNOSIS — D631 Anemia in chronic kidney disease: Secondary | ICD-10-CM | POA: Diagnosis not present

## 2021-05-19 DIAGNOSIS — Z7901 Long term (current) use of anticoagulants: Secondary | ICD-10-CM | POA: Diagnosis not present

## 2021-05-19 DIAGNOSIS — N186 End stage renal disease: Secondary | ICD-10-CM | POA: Diagnosis not present

## 2021-05-20 DIAGNOSIS — L821 Other seborrheic keratosis: Secondary | ICD-10-CM | POA: Diagnosis not present

## 2021-05-20 DIAGNOSIS — D485 Neoplasm of uncertain behavior of skin: Secondary | ICD-10-CM | POA: Diagnosis not present

## 2021-05-20 DIAGNOSIS — L988 Other specified disorders of the skin and subcutaneous tissue: Secondary | ICD-10-CM | POA: Diagnosis not present

## 2021-05-21 DIAGNOSIS — N186 End stage renal disease: Secondary | ICD-10-CM | POA: Diagnosis not present

## 2021-05-21 DIAGNOSIS — D631 Anemia in chronic kidney disease: Secondary | ICD-10-CM | POA: Diagnosis not present

## 2021-05-21 DIAGNOSIS — N2581 Secondary hyperparathyroidism of renal origin: Secondary | ICD-10-CM | POA: Diagnosis not present

## 2021-05-21 DIAGNOSIS — D509 Iron deficiency anemia, unspecified: Secondary | ICD-10-CM | POA: Diagnosis not present

## 2021-05-23 DIAGNOSIS — N186 End stage renal disease: Secondary | ICD-10-CM | POA: Diagnosis not present

## 2021-05-23 DIAGNOSIS — D631 Anemia in chronic kidney disease: Secondary | ICD-10-CM | POA: Diagnosis not present

## 2021-05-23 DIAGNOSIS — N2581 Secondary hyperparathyroidism of renal origin: Secondary | ICD-10-CM | POA: Diagnosis not present

## 2021-05-23 DIAGNOSIS — D509 Iron deficiency anemia, unspecified: Secondary | ICD-10-CM | POA: Diagnosis not present

## 2021-05-26 DIAGNOSIS — D509 Iron deficiency anemia, unspecified: Secondary | ICD-10-CM | POA: Diagnosis not present

## 2021-05-26 DIAGNOSIS — Z7901 Long term (current) use of anticoagulants: Secondary | ICD-10-CM | POA: Diagnosis not present

## 2021-05-26 DIAGNOSIS — N2581 Secondary hyperparathyroidism of renal origin: Secondary | ICD-10-CM | POA: Diagnosis not present

## 2021-05-26 DIAGNOSIS — N186 End stage renal disease: Secondary | ICD-10-CM | POA: Diagnosis not present

## 2021-05-26 DIAGNOSIS — D631 Anemia in chronic kidney disease: Secondary | ICD-10-CM | POA: Diagnosis not present

## 2021-05-26 NOTE — Telephone Encounter (Signed)
Clinicals faxed to [employer] for pre-determination for Zilretta.  ?

## 2021-05-28 ENCOUNTER — Other Ambulatory Visit: Payer: Self-pay | Admitting: Cardiology

## 2021-05-29 NOTE — Telephone Encounter (Signed)
Received fax from [facility] 32 mg PA is approved 05/26/21 - 08/25/21. However, no OOP cost information was provided. I called pt- left a detailed message informing her we received the auth from [place], but I asked her to call her plan and ask for her OOP cost. I also advised her to call us when she has spoken to [place] and is ready to scheduler her right knee Zilretta injection.  ?

## 2021-05-30 DIAGNOSIS — N2581 Secondary hyperparathyroidism of renal origin: Secondary | ICD-10-CM | POA: Diagnosis not present

## 2021-05-30 DIAGNOSIS — D631 Anemia in chronic kidney disease: Secondary | ICD-10-CM | POA: Diagnosis not present

## 2021-05-30 DIAGNOSIS — N186 End stage renal disease: Secondary | ICD-10-CM | POA: Diagnosis not present

## 2021-05-30 DIAGNOSIS — D509 Iron deficiency anemia, unspecified: Secondary | ICD-10-CM | POA: Diagnosis not present

## 2021-06-01 DIAGNOSIS — N186 End stage renal disease: Secondary | ICD-10-CM | POA: Diagnosis not present

## 2021-06-01 DIAGNOSIS — Z992 Dependence on renal dialysis: Secondary | ICD-10-CM | POA: Diagnosis not present

## 2021-06-02 DIAGNOSIS — D509 Iron deficiency anemia, unspecified: Secondary | ICD-10-CM | POA: Diagnosis not present

## 2021-06-02 DIAGNOSIS — N2581 Secondary hyperparathyroidism of renal origin: Secondary | ICD-10-CM | POA: Diagnosis not present

## 2021-06-02 DIAGNOSIS — D631 Anemia in chronic kidney disease: Secondary | ICD-10-CM | POA: Diagnosis not present

## 2021-06-02 DIAGNOSIS — Z7901 Long term (current) use of anticoagulants: Secondary | ICD-10-CM | POA: Diagnosis not present

## 2021-06-02 DIAGNOSIS — N186 End stage renal disease: Secondary | ICD-10-CM | POA: Diagnosis not present

## 2021-06-04 DIAGNOSIS — D509 Iron deficiency anemia, unspecified: Secondary | ICD-10-CM | POA: Diagnosis not present

## 2021-06-04 DIAGNOSIS — N2581 Secondary hyperparathyroidism of renal origin: Secondary | ICD-10-CM | POA: Diagnosis not present

## 2021-06-04 DIAGNOSIS — N186 End stage renal disease: Secondary | ICD-10-CM | POA: Diagnosis not present

## 2021-06-04 DIAGNOSIS — D631 Anemia in chronic kidney disease: Secondary | ICD-10-CM | POA: Diagnosis not present

## 2021-06-06 DIAGNOSIS — N2581 Secondary hyperparathyroidism of renal origin: Secondary | ICD-10-CM | POA: Diagnosis not present

## 2021-06-06 DIAGNOSIS — D631 Anemia in chronic kidney disease: Secondary | ICD-10-CM | POA: Diagnosis not present

## 2021-06-06 DIAGNOSIS — D509 Iron deficiency anemia, unspecified: Secondary | ICD-10-CM | POA: Diagnosis not present

## 2021-06-06 DIAGNOSIS — N186 End stage renal disease: Secondary | ICD-10-CM | POA: Diagnosis not present

## 2021-06-09 DIAGNOSIS — N2581 Secondary hyperparathyroidism of renal origin: Secondary | ICD-10-CM | POA: Diagnosis not present

## 2021-06-09 DIAGNOSIS — N186 End stage renal disease: Secondary | ICD-10-CM | POA: Diagnosis not present

## 2021-06-09 DIAGNOSIS — Z7901 Long term (current) use of anticoagulants: Secondary | ICD-10-CM | POA: Diagnosis not present

## 2021-06-09 DIAGNOSIS — D631 Anemia in chronic kidney disease: Secondary | ICD-10-CM | POA: Diagnosis not present

## 2021-06-09 DIAGNOSIS — D509 Iron deficiency anemia, unspecified: Secondary | ICD-10-CM | POA: Diagnosis not present

## 2021-06-11 DIAGNOSIS — N2581 Secondary hyperparathyroidism of renal origin: Secondary | ICD-10-CM | POA: Diagnosis not present

## 2021-06-11 DIAGNOSIS — D631 Anemia in chronic kidney disease: Secondary | ICD-10-CM | POA: Diagnosis not present

## 2021-06-11 DIAGNOSIS — D509 Iron deficiency anemia, unspecified: Secondary | ICD-10-CM | POA: Diagnosis not present

## 2021-06-11 DIAGNOSIS — N186 End stage renal disease: Secondary | ICD-10-CM | POA: Diagnosis not present

## 2021-06-13 DIAGNOSIS — N186 End stage renal disease: Secondary | ICD-10-CM | POA: Diagnosis not present

## 2021-06-13 DIAGNOSIS — D631 Anemia in chronic kidney disease: Secondary | ICD-10-CM | POA: Diagnosis not present

## 2021-06-13 DIAGNOSIS — N2581 Secondary hyperparathyroidism of renal origin: Secondary | ICD-10-CM | POA: Diagnosis not present

## 2021-06-13 DIAGNOSIS — D509 Iron deficiency anemia, unspecified: Secondary | ICD-10-CM | POA: Diagnosis not present

## 2021-06-16 DIAGNOSIS — N2581 Secondary hyperparathyroidism of renal origin: Secondary | ICD-10-CM | POA: Diagnosis not present

## 2021-06-16 DIAGNOSIS — N186 End stage renal disease: Secondary | ICD-10-CM | POA: Diagnosis not present

## 2021-06-16 DIAGNOSIS — Z7901 Long term (current) use of anticoagulants: Secondary | ICD-10-CM | POA: Diagnosis not present

## 2021-06-16 DIAGNOSIS — D631 Anemia in chronic kidney disease: Secondary | ICD-10-CM | POA: Diagnosis not present

## 2021-06-16 DIAGNOSIS — D509 Iron deficiency anemia, unspecified: Secondary | ICD-10-CM | POA: Diagnosis not present

## 2021-06-18 DIAGNOSIS — D509 Iron deficiency anemia, unspecified: Secondary | ICD-10-CM | POA: Diagnosis not present

## 2021-06-18 DIAGNOSIS — N2581 Secondary hyperparathyroidism of renal origin: Secondary | ICD-10-CM | POA: Diagnosis not present

## 2021-06-18 DIAGNOSIS — N186 End stage renal disease: Secondary | ICD-10-CM | POA: Diagnosis not present

## 2021-06-18 DIAGNOSIS — D631 Anemia in chronic kidney disease: Secondary | ICD-10-CM | POA: Diagnosis not present

## 2021-06-19 ENCOUNTER — Encounter: Payer: Self-pay | Admitting: Family Medicine

## 2021-06-19 ENCOUNTER — Ambulatory Visit: Payer: Medicare HMO | Admitting: Family Medicine

## 2021-06-19 ENCOUNTER — Ambulatory Visit (INDEPENDENT_AMBULATORY_CARE_PROVIDER_SITE_OTHER): Payer: Medicare HMO | Admitting: Family Medicine

## 2021-06-19 VITALS — BP 143/47 | HR 63 | Ht 66.0 in | Wt 132.8 lb

## 2021-06-19 DIAGNOSIS — E782 Mixed hyperlipidemia: Secondary | ICD-10-CM

## 2021-06-19 DIAGNOSIS — R7989 Other specified abnormal findings of blood chemistry: Secondary | ICD-10-CM

## 2021-06-19 DIAGNOSIS — N186 End stage renal disease: Secondary | ICD-10-CM

## 2021-06-19 LAB — LIPID PANEL
Cholesterol: 124 mg/dL (ref 0–200)
HDL: 89.1 mg/dL (ref >39.00)
LDL Cholesterol: 19 mg/dL (ref 0–99)
NonHDL: 34.52
Total CHOL/HDL Ratio: 1
Triglycerides: 76 mg/dL (ref 0.0–149.0)
VLDL: 15.2 mg/dL (ref 0.0–40.0)

## 2021-06-19 LAB — T4, FREE: Free T4: 1.12 ng/dL (ref 0.60–1.60)

## 2021-06-19 LAB — TSH: TSH: 0.23 u[IU]/mL — ABNORMAL LOW (ref 0.35–5.50)

## 2021-06-19 LAB — MAGNESIUM: Magnesium: 2.3 mg/dL (ref 1.5–2.5)

## 2021-06-19 NOTE — Progress Notes (Signed)
Established Patient Office Visit  Subjective   Patient ID: Jillian Hayes, female    DOB: 11/21/1945  Age: 76 y.o. MRN: [id number]  CC: routine f/u    HPI Patient is here for routine f/u.    HYPERTENSION and AFIB: - Medications: coreg 25 mg BID, diltiazem 240 mg daily, warfarin 1 mg TRS and 5 mg daily, hydralazine 75 mg BID,   - Compliance: yes - Checking BP at home: no, stable at HD - Denies any SOB, CP, vision changes, LE edema, dizziness, palpitations, or medication side effects. - Diet: low sodium, renal restrictions  - Exercise: walking   HYPERLIPIDEMIA - medications: Lipitor 20 mg daily - compliance: yes - medication SEs: no The 10-year ASCVD risk score (Arnett DK, et al., 2019) is: 25.1%   Values used to calculate the score:     Age: 67 years     Sex: Female     Is Non-Hispanic African American: No     Diabetic: No     Tobacco smoker: No     Systolic Blood Pressure: 371 mmHg     Is BP treated: Yes     HDL Cholesterol: 94.8 mg/dL     Total Cholesterol: 141 mg/dL   ESRD and PULMONARY HTN: - At last visit she reported that nephrology would not place her on transplant list given worsening pulm HTN. She was referred to pulmonology.  - She is getting headaches (up to 8/10) after dialysis. She mentioned it to the HD team and they said they probably pulled off too much fluid Monday and were not planning to take any yesterday, but 1 L was still removed. She read that it could be BUN or Mag levels. States they have been monitoring her BUN (47 this week, better than the past few months) , but she would like me to check her magnesium. She is going to mention to the nephrologist tomorrow at dialysis.       ROS All review of systems negative except what is listed in the HPI    Objective:     BP (!) 143/47   Pulse 63   Ht '5\' 6"'$  (1.676 m)   Wt 132 lb 12.8 oz (60.2 kg)   BMI 21.43 kg/m    Physical Exam Vitals reviewed.  Constitutional:      Appearance: Normal  appearance. She is normal weight.  Cardiovascular:     Rate and Rhythm: Normal rate. Rhythm irregular.     Heart sounds: Murmur heard.  Pulmonary:     Effort: Pulmonary effort is normal.     Breath sounds: Normal breath sounds.  Skin:    General: Skin is warm and dry.  Neurological:     General: No focal deficit present.     Mental Status: She is alert and oriented to person, place, and time. Mental status is at baseline.  Psychiatric:        Mood and Affect: Mood normal.        Behavior: Behavior normal.        Thought Content: Thought content normal.        Judgment: Judgment normal.     No results found for any visits on 06/19/21.    The 10-year ASCVD risk score (Arnett DK, et al., 2019) is: 25.1%    Assessment & Plan:    1. ESRD (end stage renal disease) ([place]) Has been having headaches this week after HD. BUN has been stable, they just checked this week. Will add  Magnesium at her request. She is planning to tell the nephrologist tomorrow - thinks they have been taking off a little too much fluid based on her weight.  - Magnesium  2. Low TSH level Previously subclinical in the past, will recheck today.  - T4, free - TSH  3. Mixed hyperlipidemia Previously stable. Rechecking today. Continue with Lipitor, healthy diet and exercise.  - Lipid panel   Return for 3-4 months routine w PCP.    Terrilyn Saver, NP

## 2021-06-20 ENCOUNTER — Encounter: Payer: Self-pay | Admitting: *Deleted

## 2021-06-20 DIAGNOSIS — N186 End stage renal disease: Secondary | ICD-10-CM | POA: Diagnosis not present

## 2021-06-20 DIAGNOSIS — N2581 Secondary hyperparathyroidism of renal origin: Secondary | ICD-10-CM | POA: Diagnosis not present

## 2021-06-20 DIAGNOSIS — D509 Iron deficiency anemia, unspecified: Secondary | ICD-10-CM | POA: Diagnosis not present

## 2021-06-20 DIAGNOSIS — D631 Anemia in chronic kidney disease: Secondary | ICD-10-CM | POA: Diagnosis not present

## 2021-06-23 DIAGNOSIS — N186 End stage renal disease: Secondary | ICD-10-CM | POA: Diagnosis not present

## 2021-06-23 DIAGNOSIS — D509 Iron deficiency anemia, unspecified: Secondary | ICD-10-CM | POA: Diagnosis not present

## 2021-06-23 DIAGNOSIS — N2581 Secondary hyperparathyroidism of renal origin: Secondary | ICD-10-CM | POA: Diagnosis not present

## 2021-06-23 DIAGNOSIS — Z7901 Long term (current) use of anticoagulants: Secondary | ICD-10-CM | POA: Diagnosis not present

## 2021-06-23 DIAGNOSIS — D631 Anemia in chronic kidney disease: Secondary | ICD-10-CM | POA: Diagnosis not present

## 2021-06-25 DIAGNOSIS — D509 Iron deficiency anemia, unspecified: Secondary | ICD-10-CM | POA: Diagnosis not present

## 2021-06-25 DIAGNOSIS — N186 End stage renal disease: Secondary | ICD-10-CM | POA: Diagnosis not present

## 2021-06-25 DIAGNOSIS — N2581 Secondary hyperparathyroidism of renal origin: Secondary | ICD-10-CM | POA: Diagnosis not present

## 2021-06-25 DIAGNOSIS — D631 Anemia in chronic kidney disease: Secondary | ICD-10-CM | POA: Diagnosis not present

## 2021-06-26 DIAGNOSIS — I120 Hypertensive chronic kidney disease with stage 5 chronic kidney disease or end stage renal disease: Secondary | ICD-10-CM | POA: Diagnosis not present

## 2021-06-26 DIAGNOSIS — D6869 Other thrombophilia: Secondary | ICD-10-CM | POA: Diagnosis not present

## 2021-06-26 DIAGNOSIS — N2581 Secondary hyperparathyroidism of renal origin: Secondary | ICD-10-CM | POA: Diagnosis not present

## 2021-06-26 DIAGNOSIS — Z7901 Long term (current) use of anticoagulants: Secondary | ICD-10-CM | POA: Diagnosis not present

## 2021-06-26 DIAGNOSIS — E785 Hyperlipidemia, unspecified: Secondary | ICD-10-CM | POA: Diagnosis not present

## 2021-06-26 DIAGNOSIS — K5900 Constipation, unspecified: Secondary | ICD-10-CM | POA: Diagnosis not present

## 2021-06-26 DIAGNOSIS — Z882 Allergy status to sulfonamides status: Secondary | ICD-10-CM | POA: Diagnosis not present

## 2021-06-26 DIAGNOSIS — I4891 Unspecified atrial fibrillation: Secondary | ICD-10-CM | POA: Diagnosis not present

## 2021-06-26 DIAGNOSIS — Z992 Dependence on renal dialysis: Secondary | ICD-10-CM | POA: Diagnosis not present

## 2021-06-26 DIAGNOSIS — N186 End stage renal disease: Secondary | ICD-10-CM | POA: Diagnosis not present

## 2021-06-26 DIAGNOSIS — M1990 Unspecified osteoarthritis, unspecified site: Secondary | ICD-10-CM | POA: Diagnosis not present

## 2021-06-27 DIAGNOSIS — D509 Iron deficiency anemia, unspecified: Secondary | ICD-10-CM | POA: Diagnosis not present

## 2021-06-27 DIAGNOSIS — N186 End stage renal disease: Secondary | ICD-10-CM | POA: Diagnosis not present

## 2021-06-27 DIAGNOSIS — D631 Anemia in chronic kidney disease: Secondary | ICD-10-CM | POA: Diagnosis not present

## 2021-06-27 DIAGNOSIS — N2581 Secondary hyperparathyroidism of renal origin: Secondary | ICD-10-CM | POA: Diagnosis not present

## 2021-06-30 DIAGNOSIS — D631 Anemia in chronic kidney disease: Secondary | ICD-10-CM | POA: Diagnosis not present

## 2021-06-30 DIAGNOSIS — N186 End stage renal disease: Secondary | ICD-10-CM | POA: Diagnosis not present

## 2021-06-30 DIAGNOSIS — N2581 Secondary hyperparathyroidism of renal origin: Secondary | ICD-10-CM | POA: Diagnosis not present

## 2021-06-30 DIAGNOSIS — D509 Iron deficiency anemia, unspecified: Secondary | ICD-10-CM | POA: Diagnosis not present

## 2021-07-02 DIAGNOSIS — N186 End stage renal disease: Secondary | ICD-10-CM | POA: Diagnosis not present

## 2021-07-02 DIAGNOSIS — Z992 Dependence on renal dialysis: Secondary | ICD-10-CM | POA: Diagnosis not present

## 2021-07-02 DIAGNOSIS — Z7901 Long term (current) use of anticoagulants: Secondary | ICD-10-CM | POA: Diagnosis not present

## 2021-07-02 DIAGNOSIS — N2581 Secondary hyperparathyroidism of renal origin: Secondary | ICD-10-CM | POA: Diagnosis not present

## 2021-07-02 DIAGNOSIS — D509 Iron deficiency anemia, unspecified: Secondary | ICD-10-CM | POA: Diagnosis not present

## 2021-07-02 DIAGNOSIS — D631 Anemia in chronic kidney disease: Secondary | ICD-10-CM | POA: Diagnosis not present

## 2021-07-04 DIAGNOSIS — D631 Anemia in chronic kidney disease: Secondary | ICD-10-CM | POA: Diagnosis not present

## 2021-07-04 DIAGNOSIS — D509 Iron deficiency anemia, unspecified: Secondary | ICD-10-CM | POA: Diagnosis not present

## 2021-07-04 DIAGNOSIS — N186 End stage renal disease: Secondary | ICD-10-CM | POA: Diagnosis not present

## 2021-07-04 DIAGNOSIS — N2581 Secondary hyperparathyroidism of renal origin: Secondary | ICD-10-CM | POA: Diagnosis not present

## 2021-07-07 DIAGNOSIS — N186 End stage renal disease: Secondary | ICD-10-CM | POA: Diagnosis not present

## 2021-07-07 DIAGNOSIS — Z7901 Long term (current) use of anticoagulants: Secondary | ICD-10-CM | POA: Diagnosis not present

## 2021-07-07 DIAGNOSIS — D631 Anemia in chronic kidney disease: Secondary | ICD-10-CM | POA: Diagnosis not present

## 2021-07-07 DIAGNOSIS — D509 Iron deficiency anemia, unspecified: Secondary | ICD-10-CM | POA: Diagnosis not present

## 2021-07-07 DIAGNOSIS — N2581 Secondary hyperparathyroidism of renal origin: Secondary | ICD-10-CM | POA: Diagnosis not present

## 2021-07-09 DIAGNOSIS — D631 Anemia in chronic kidney disease: Secondary | ICD-10-CM | POA: Diagnosis not present

## 2021-07-09 DIAGNOSIS — N186 End stage renal disease: Secondary | ICD-10-CM | POA: Diagnosis not present

## 2021-07-09 DIAGNOSIS — N2581 Secondary hyperparathyroidism of renal origin: Secondary | ICD-10-CM | POA: Diagnosis not present

## 2021-07-09 DIAGNOSIS — D509 Iron deficiency anemia, unspecified: Secondary | ICD-10-CM | POA: Diagnosis not present

## 2021-07-11 DIAGNOSIS — D631 Anemia in chronic kidney disease: Secondary | ICD-10-CM | POA: Diagnosis not present

## 2021-07-11 DIAGNOSIS — D509 Iron deficiency anemia, unspecified: Secondary | ICD-10-CM | POA: Diagnosis not present

## 2021-07-11 DIAGNOSIS — N2581 Secondary hyperparathyroidism of renal origin: Secondary | ICD-10-CM | POA: Diagnosis not present

## 2021-07-11 DIAGNOSIS — N186 End stage renal disease: Secondary | ICD-10-CM | POA: Diagnosis not present

## 2021-07-14 DIAGNOSIS — D631 Anemia in chronic kidney disease: Secondary | ICD-10-CM | POA: Diagnosis not present

## 2021-07-14 DIAGNOSIS — N186 End stage renal disease: Secondary | ICD-10-CM | POA: Diagnosis not present

## 2021-07-14 DIAGNOSIS — D509 Iron deficiency anemia, unspecified: Secondary | ICD-10-CM | POA: Diagnosis not present

## 2021-07-14 DIAGNOSIS — Z7901 Long term (current) use of anticoagulants: Secondary | ICD-10-CM | POA: Diagnosis not present

## 2021-07-14 DIAGNOSIS — N2581 Secondary hyperparathyroidism of renal origin: Secondary | ICD-10-CM | POA: Diagnosis not present

## 2021-07-16 DIAGNOSIS — D509 Iron deficiency anemia, unspecified: Secondary | ICD-10-CM | POA: Diagnosis not present

## 2021-07-16 DIAGNOSIS — N186 End stage renal disease: Secondary | ICD-10-CM | POA: Diagnosis not present

## 2021-07-16 DIAGNOSIS — D631 Anemia in chronic kidney disease: Secondary | ICD-10-CM | POA: Diagnosis not present

## 2021-07-16 DIAGNOSIS — N2581 Secondary hyperparathyroidism of renal origin: Secondary | ICD-10-CM | POA: Diagnosis not present

## 2021-07-18 DIAGNOSIS — N186 End stage renal disease: Secondary | ICD-10-CM | POA: Diagnosis not present

## 2021-07-18 DIAGNOSIS — N2581 Secondary hyperparathyroidism of renal origin: Secondary | ICD-10-CM | POA: Diagnosis not present

## 2021-07-18 DIAGNOSIS — D509 Iron deficiency anemia, unspecified: Secondary | ICD-10-CM | POA: Diagnosis not present

## 2021-07-18 DIAGNOSIS — D631 Anemia in chronic kidney disease: Secondary | ICD-10-CM | POA: Diagnosis not present

## 2021-07-21 DIAGNOSIS — D509 Iron deficiency anemia, unspecified: Secondary | ICD-10-CM | POA: Diagnosis not present

## 2021-07-21 DIAGNOSIS — Z7901 Long term (current) use of anticoagulants: Secondary | ICD-10-CM | POA: Diagnosis not present

## 2021-07-21 DIAGNOSIS — D631 Anemia in chronic kidney disease: Secondary | ICD-10-CM | POA: Diagnosis not present

## 2021-07-21 DIAGNOSIS — N186 End stage renal disease: Secondary | ICD-10-CM | POA: Diagnosis not present

## 2021-07-21 DIAGNOSIS — N2581 Secondary hyperparathyroidism of renal origin: Secondary | ICD-10-CM | POA: Diagnosis not present

## 2021-07-23 DIAGNOSIS — N2581 Secondary hyperparathyroidism of renal origin: Secondary | ICD-10-CM | POA: Diagnosis not present

## 2021-07-23 DIAGNOSIS — N186 End stage renal disease: Secondary | ICD-10-CM | POA: Diagnosis not present

## 2021-07-23 DIAGNOSIS — D509 Iron deficiency anemia, unspecified: Secondary | ICD-10-CM | POA: Diagnosis not present

## 2021-07-23 DIAGNOSIS — D631 Anemia in chronic kidney disease: Secondary | ICD-10-CM | POA: Diagnosis not present

## 2021-07-25 DIAGNOSIS — N186 End stage renal disease: Secondary | ICD-10-CM | POA: Diagnosis not present

## 2021-07-25 DIAGNOSIS — D631 Anemia in chronic kidney disease: Secondary | ICD-10-CM | POA: Diagnosis not present

## 2021-07-25 DIAGNOSIS — D509 Iron deficiency anemia, unspecified: Secondary | ICD-10-CM | POA: Diagnosis not present

## 2021-07-25 DIAGNOSIS — N2581 Secondary hyperparathyroidism of renal origin: Secondary | ICD-10-CM | POA: Diagnosis not present

## 2021-07-28 DIAGNOSIS — N2581 Secondary hyperparathyroidism of renal origin: Secondary | ICD-10-CM | POA: Diagnosis not present

## 2021-07-28 DIAGNOSIS — N186 End stage renal disease: Secondary | ICD-10-CM | POA: Diagnosis not present

## 2021-07-28 DIAGNOSIS — D509 Iron deficiency anemia, unspecified: Secondary | ICD-10-CM | POA: Diagnosis not present

## 2021-07-28 DIAGNOSIS — Z7901 Long term (current) use of anticoagulants: Secondary | ICD-10-CM | POA: Diagnosis not present

## 2021-07-28 DIAGNOSIS — D631 Anemia in chronic kidney disease: Secondary | ICD-10-CM | POA: Diagnosis not present

## 2021-07-30 DIAGNOSIS — D509 Iron deficiency anemia, unspecified: Secondary | ICD-10-CM | POA: Diagnosis not present

## 2021-07-30 DIAGNOSIS — D631 Anemia in chronic kidney disease: Secondary | ICD-10-CM | POA: Diagnosis not present

## 2021-07-30 DIAGNOSIS — N186 End stage renal disease: Secondary | ICD-10-CM | POA: Diagnosis not present

## 2021-07-30 DIAGNOSIS — N2581 Secondary hyperparathyroidism of renal origin: Secondary | ICD-10-CM | POA: Diagnosis not present

## 2021-08-01 DIAGNOSIS — N186 End stage renal disease: Secondary | ICD-10-CM | POA: Diagnosis not present

## 2021-08-01 DIAGNOSIS — Z992 Dependence on renal dialysis: Secondary | ICD-10-CM | POA: Diagnosis not present

## 2021-08-01 DIAGNOSIS — D509 Iron deficiency anemia, unspecified: Secondary | ICD-10-CM | POA: Diagnosis not present

## 2021-08-01 DIAGNOSIS — N2581 Secondary hyperparathyroidism of renal origin: Secondary | ICD-10-CM | POA: Diagnosis not present

## 2021-08-01 DIAGNOSIS — D631 Anemia in chronic kidney disease: Secondary | ICD-10-CM | POA: Diagnosis not present

## 2021-08-04 DIAGNOSIS — D631 Anemia in chronic kidney disease: Secondary | ICD-10-CM | POA: Diagnosis not present

## 2021-08-04 DIAGNOSIS — Z7901 Long term (current) use of anticoagulants: Secondary | ICD-10-CM | POA: Diagnosis not present

## 2021-08-04 DIAGNOSIS — N2581 Secondary hyperparathyroidism of renal origin: Secondary | ICD-10-CM | POA: Diagnosis not present

## 2021-08-04 DIAGNOSIS — N186 End stage renal disease: Secondary | ICD-10-CM | POA: Diagnosis not present

## 2021-08-04 DIAGNOSIS — D509 Iron deficiency anemia, unspecified: Secondary | ICD-10-CM | POA: Diagnosis not present

## 2021-08-06 DIAGNOSIS — D509 Iron deficiency anemia, unspecified: Secondary | ICD-10-CM | POA: Diagnosis not present

## 2021-08-06 DIAGNOSIS — N186 End stage renal disease: Secondary | ICD-10-CM | POA: Diagnosis not present

## 2021-08-06 DIAGNOSIS — D631 Anemia in chronic kidney disease: Secondary | ICD-10-CM | POA: Diagnosis not present

## 2021-08-06 DIAGNOSIS — N2581 Secondary hyperparathyroidism of renal origin: Secondary | ICD-10-CM | POA: Diagnosis not present

## 2021-08-08 DIAGNOSIS — N186 End stage renal disease: Secondary | ICD-10-CM | POA: Diagnosis not present

## 2021-08-08 DIAGNOSIS — D509 Iron deficiency anemia, unspecified: Secondary | ICD-10-CM | POA: Diagnosis not present

## 2021-08-08 DIAGNOSIS — D631 Anemia in chronic kidney disease: Secondary | ICD-10-CM | POA: Diagnosis not present

## 2021-08-08 DIAGNOSIS — N2581 Secondary hyperparathyroidism of renal origin: Secondary | ICD-10-CM | POA: Diagnosis not present

## 2021-08-11 DIAGNOSIS — D509 Iron deficiency anemia, unspecified: Secondary | ICD-10-CM | POA: Diagnosis not present

## 2021-08-11 DIAGNOSIS — D631 Anemia in chronic kidney disease: Secondary | ICD-10-CM | POA: Diagnosis not present

## 2021-08-11 DIAGNOSIS — Z7901 Long term (current) use of anticoagulants: Secondary | ICD-10-CM | POA: Diagnosis not present

## 2021-08-11 DIAGNOSIS — N186 End stage renal disease: Secondary | ICD-10-CM | POA: Diagnosis not present

## 2021-08-11 DIAGNOSIS — N2581 Secondary hyperparathyroidism of renal origin: Secondary | ICD-10-CM | POA: Diagnosis not present

## 2021-08-13 DIAGNOSIS — N186 End stage renal disease: Secondary | ICD-10-CM | POA: Diagnosis not present

## 2021-08-13 DIAGNOSIS — D509 Iron deficiency anemia, unspecified: Secondary | ICD-10-CM | POA: Diagnosis not present

## 2021-08-13 DIAGNOSIS — D631 Anemia in chronic kidney disease: Secondary | ICD-10-CM | POA: Diagnosis not present

## 2021-08-13 DIAGNOSIS — N2581 Secondary hyperparathyroidism of renal origin: Secondary | ICD-10-CM | POA: Diagnosis not present

## 2021-08-15 DIAGNOSIS — D631 Anemia in chronic kidney disease: Secondary | ICD-10-CM | POA: Diagnosis not present

## 2021-08-15 DIAGNOSIS — D509 Iron deficiency anemia, unspecified: Secondary | ICD-10-CM | POA: Diagnosis not present

## 2021-08-15 DIAGNOSIS — N186 End stage renal disease: Secondary | ICD-10-CM | POA: Diagnosis not present

## 2021-08-15 DIAGNOSIS — N2581 Secondary hyperparathyroidism of renal origin: Secondary | ICD-10-CM | POA: Diagnosis not present

## 2021-08-18 DIAGNOSIS — N186 End stage renal disease: Secondary | ICD-10-CM | POA: Diagnosis not present

## 2021-08-18 DIAGNOSIS — N2581 Secondary hyperparathyroidism of renal origin: Secondary | ICD-10-CM | POA: Diagnosis not present

## 2021-08-18 DIAGNOSIS — D631 Anemia in chronic kidney disease: Secondary | ICD-10-CM | POA: Diagnosis not present

## 2021-08-18 DIAGNOSIS — D509 Iron deficiency anemia, unspecified: Secondary | ICD-10-CM | POA: Diagnosis not present

## 2021-08-18 DIAGNOSIS — Z7901 Long term (current) use of anticoagulants: Secondary | ICD-10-CM | POA: Diagnosis not present

## 2021-08-19 DIAGNOSIS — L720 Epidermal cyst: Secondary | ICD-10-CM | POA: Diagnosis not present

## 2021-08-19 DIAGNOSIS — L821 Other seborrheic keratosis: Secondary | ICD-10-CM | POA: Diagnosis not present

## 2021-08-19 DIAGNOSIS — D1801 Hemangioma of skin and subcutaneous tissue: Secondary | ICD-10-CM | POA: Diagnosis not present

## 2021-08-19 DIAGNOSIS — H6122 Impacted cerumen, left ear: Secondary | ICD-10-CM | POA: Diagnosis not present

## 2021-08-19 DIAGNOSIS — D2262 Melanocytic nevi of left upper limb, including shoulder: Secondary | ICD-10-CM | POA: Diagnosis not present

## 2021-08-19 DIAGNOSIS — H6982 Other specified disorders of Eustachian tube, left ear: Secondary | ICD-10-CM | POA: Diagnosis not present

## 2021-08-20 DIAGNOSIS — N186 End stage renal disease: Secondary | ICD-10-CM | POA: Diagnosis not present

## 2021-08-20 DIAGNOSIS — N2581 Secondary hyperparathyroidism of renal origin: Secondary | ICD-10-CM | POA: Diagnosis not present

## 2021-08-20 DIAGNOSIS — D509 Iron deficiency anemia, unspecified: Secondary | ICD-10-CM | POA: Diagnosis not present

## 2021-08-20 DIAGNOSIS — D631 Anemia in chronic kidney disease: Secondary | ICD-10-CM | POA: Diagnosis not present

## 2021-08-22 DIAGNOSIS — N2581 Secondary hyperparathyroidism of renal origin: Secondary | ICD-10-CM | POA: Diagnosis not present

## 2021-08-22 DIAGNOSIS — D509 Iron deficiency anemia, unspecified: Secondary | ICD-10-CM | POA: Diagnosis not present

## 2021-08-22 DIAGNOSIS — D631 Anemia in chronic kidney disease: Secondary | ICD-10-CM | POA: Diagnosis not present

## 2021-08-22 DIAGNOSIS — N186 End stage renal disease: Secondary | ICD-10-CM | POA: Diagnosis not present

## 2021-08-25 DIAGNOSIS — N186 End stage renal disease: Secondary | ICD-10-CM | POA: Diagnosis not present

## 2021-08-25 DIAGNOSIS — N2581 Secondary hyperparathyroidism of renal origin: Secondary | ICD-10-CM | POA: Diagnosis not present

## 2021-08-25 DIAGNOSIS — Z7901 Long term (current) use of anticoagulants: Secondary | ICD-10-CM | POA: Diagnosis not present

## 2021-08-25 DIAGNOSIS — D631 Anemia in chronic kidney disease: Secondary | ICD-10-CM | POA: Diagnosis not present

## 2021-08-25 DIAGNOSIS — D509 Iron deficiency anemia, unspecified: Secondary | ICD-10-CM | POA: Diagnosis not present

## 2021-08-27 DIAGNOSIS — D631 Anemia in chronic kidney disease: Secondary | ICD-10-CM | POA: Diagnosis not present

## 2021-08-27 DIAGNOSIS — N2581 Secondary hyperparathyroidism of renal origin: Secondary | ICD-10-CM | POA: Diagnosis not present

## 2021-08-27 DIAGNOSIS — N186 End stage renal disease: Secondary | ICD-10-CM | POA: Diagnosis not present

## 2021-08-27 DIAGNOSIS — D509 Iron deficiency anemia, unspecified: Secondary | ICD-10-CM | POA: Diagnosis not present

## 2021-08-29 DIAGNOSIS — D631 Anemia in chronic kidney disease: Secondary | ICD-10-CM | POA: Diagnosis not present

## 2021-08-29 DIAGNOSIS — N186 End stage renal disease: Secondary | ICD-10-CM | POA: Diagnosis not present

## 2021-08-29 DIAGNOSIS — N2581 Secondary hyperparathyroidism of renal origin: Secondary | ICD-10-CM | POA: Diagnosis not present

## 2021-08-29 DIAGNOSIS — D509 Iron deficiency anemia, unspecified: Secondary | ICD-10-CM | POA: Diagnosis not present

## 2021-09-01 DIAGNOSIS — N2581 Secondary hyperparathyroidism of renal origin: Secondary | ICD-10-CM | POA: Diagnosis not present

## 2021-09-01 DIAGNOSIS — Z992 Dependence on renal dialysis: Secondary | ICD-10-CM | POA: Diagnosis not present

## 2021-09-01 DIAGNOSIS — N186 End stage renal disease: Secondary | ICD-10-CM | POA: Diagnosis not present

## 2021-09-01 DIAGNOSIS — Z7901 Long term (current) use of anticoagulants: Secondary | ICD-10-CM | POA: Diagnosis not present

## 2021-09-01 DIAGNOSIS — D631 Anemia in chronic kidney disease: Secondary | ICD-10-CM | POA: Diagnosis not present

## 2021-09-01 DIAGNOSIS — D509 Iron deficiency anemia, unspecified: Secondary | ICD-10-CM | POA: Diagnosis not present

## 2021-09-03 DIAGNOSIS — D631 Anemia in chronic kidney disease: Secondary | ICD-10-CM | POA: Diagnosis not present

## 2021-09-03 DIAGNOSIS — N186 End stage renal disease: Secondary | ICD-10-CM | POA: Diagnosis not present

## 2021-09-03 DIAGNOSIS — D509 Iron deficiency anemia, unspecified: Secondary | ICD-10-CM | POA: Diagnosis not present

## 2021-09-03 DIAGNOSIS — N2581 Secondary hyperparathyroidism of renal origin: Secondary | ICD-10-CM | POA: Diagnosis not present

## 2021-09-05 DIAGNOSIS — N186 End stage renal disease: Secondary | ICD-10-CM | POA: Diagnosis not present

## 2021-09-05 DIAGNOSIS — D631 Anemia in chronic kidney disease: Secondary | ICD-10-CM | POA: Diagnosis not present

## 2021-09-05 DIAGNOSIS — N2581 Secondary hyperparathyroidism of renal origin: Secondary | ICD-10-CM | POA: Diagnosis not present

## 2021-09-05 DIAGNOSIS — D509 Iron deficiency anemia, unspecified: Secondary | ICD-10-CM | POA: Diagnosis not present

## 2021-09-08 DIAGNOSIS — D509 Iron deficiency anemia, unspecified: Secondary | ICD-10-CM | POA: Diagnosis not present

## 2021-09-08 DIAGNOSIS — N2581 Secondary hyperparathyroidism of renal origin: Secondary | ICD-10-CM | POA: Diagnosis not present

## 2021-09-08 DIAGNOSIS — N186 End stage renal disease: Secondary | ICD-10-CM | POA: Diagnosis not present

## 2021-09-08 DIAGNOSIS — Z7901 Long term (current) use of anticoagulants: Secondary | ICD-10-CM | POA: Diagnosis not present

## 2021-09-08 DIAGNOSIS — D631 Anemia in chronic kidney disease: Secondary | ICD-10-CM | POA: Diagnosis not present

## 2021-09-09 ENCOUNTER — Ambulatory Visit (INDEPENDENT_AMBULATORY_CARE_PROVIDER_SITE_OTHER): Payer: Medicare HMO | Admitting: Family Medicine

## 2021-09-09 ENCOUNTER — Ambulatory Visit (HOSPITAL_BASED_OUTPATIENT_CLINIC_OR_DEPARTMENT_OTHER)
Admission: RE | Admit: 2021-09-09 | Discharge: 2021-09-09 | Disposition: A | Discharge status: Home / Self Care | Payer: Medicare HMO | Source: Ambulatory Visit | Attending: Family Medicine | Admitting: Family Medicine

## 2021-09-09 ENCOUNTER — Encounter: Payer: Self-pay | Admitting: Family Medicine

## 2021-09-09 VITALS — BP 180/60 | HR 81 | Temp 98.1°F | Ht 66.0 in | Wt 132.4 lb

## 2021-09-09 DIAGNOSIS — R5383 Other fatigue: Secondary | ICD-10-CM | POA: Diagnosis not present

## 2021-09-09 DIAGNOSIS — R0602 Shortness of breath: Secondary | ICD-10-CM | POA: Diagnosis not present

## 2021-09-09 DIAGNOSIS — R9389 Abnormal findings on diagnostic imaging of other specified body structures: Secondary | ICD-10-CM | POA: Diagnosis not present

## 2021-09-09 NOTE — Patient Instructions (Signed)
Chest xray today.  Based on last echo results from 2021, cardiology was suggesting repeat echo this year - please call them to follow-up since you are having shortness of breath now.  Nephrology following labs including iron levels, we will add B12/folate to check for other reasons for fatigue.   Please contact office for follow-up if symptoms do not improve or worsen. Seek emergency care if symptoms become severe.

## 2021-09-09 NOTE — Progress Notes (Signed)
   Acute Office Visit  Subjective:     Patient ID: Jillian Hayes, female    DOB: Mar 26, 1945, 76 y.o.   MRN: [id number]  CC: fatigue, shortness of breath   HPI Patient is in today for fatigue and shortness of breath   Patient reports she has had worsening fatigue for the past few weeks (baseline fatigue, HD 3 days/week). Labs are being monitored by nephrology at HD at least month, last checked last week and she has not yet seen nephrologist since then - thinking he will stop by HD this week to address with her. They are also replacing iron as indicated.   Additionally, she has had a few weeks of worsening shortness of breath with even mild exertion. States last night had her really worried because dyspnea got significantly worse with trying to lie flat. She denies any chest pain, cough, wheezing, hemoptysis.     ROS All review of systems negative except what is listed in the HPI      Objective:    BP (!) 180/60   Pulse 81   Temp 98.1 F (36.7 C)   Ht '5\' 6"'$  (1.676 m)   Wt 132 lb 6.4 oz (60.1 kg)   SpO2 97%   BMI 21.37 kg/m    Physical Exam Vitals reviewed.  Constitutional:      Appearance: Normal appearance.  Cardiovascular:     Rate and Rhythm: Normal rate.     Heart sounds: Murmur heard.  Pulmonary:     Effort: Pulmonary effort is normal.     Breath sounds: Normal breath sounds. No wheezing, rhonchi or rales.  Musculoskeletal:     Right lower leg: No edema.     Left lower leg: No edema.  Neurological:     General: No focal deficit present.     Mental Status: She is alert and oriented to person, place, and time. Mental status is at baseline.  Psychiatric:        Behavior: Behavior normal.        Thought Content: Thought content normal.        Judgment: Judgment normal.     No results found for any visits on 09/09/21.      Assessment & Plan:   Problem List Items Addressed This Visit       Other   Fatigue - Primary Nephrology following labs from  last week. Will add B12/folate to check for other causes of fatigue.    Relevant Orders   B12 and Folate Panel   Other Visit Diagnoses     Shortness of breath     Chest xray today.  Based on last echo results from 2021, cardiology was suggesting repeat echo this year - please call them to follow-up since you are having shortness of breath now.    Relevant Orders   DG Chest 2 View     BP elevated, but fluctuates at baseline - rechecking at HD tomorrow. No chest pain, headaches, edema, vision changes. Monitors occasionally at home - will let us know is consistently high. Patient aware of signs/symptoms requiring further/urgent evaluation.  Patient aware of signs/symptoms requiring further/urgent evaluation.   No orders of the defined types were placed in this encounter.   Return if symptoms worsen or fail to improve.  Terrilyn Saver, NP

## 2021-09-10 DIAGNOSIS — N186 End stage renal disease: Secondary | ICD-10-CM | POA: Diagnosis not present

## 2021-09-10 DIAGNOSIS — N2581 Secondary hyperparathyroidism of renal origin: Secondary | ICD-10-CM | POA: Diagnosis not present

## 2021-09-10 DIAGNOSIS — D509 Iron deficiency anemia, unspecified: Secondary | ICD-10-CM | POA: Diagnosis not present

## 2021-09-10 DIAGNOSIS — Z7901 Long term (current) use of anticoagulants: Secondary | ICD-10-CM | POA: Diagnosis not present

## 2021-09-10 DIAGNOSIS — D631 Anemia in chronic kidney disease: Secondary | ICD-10-CM | POA: Diagnosis not present

## 2021-09-10 LAB — B12 AND FOLATE PANEL
Folate: >24.2 ng/mL (ref >5.9)
Vitamin B-12: >1500 pg/mL — ABNORMAL HIGH (ref 211–911)

## 2021-09-10 MED ORDER — AMOXICILLIN-POT CLAVULANATE 875-125 MG PO TABS: 1.0000 | ORAL_TABLET | Freq: Two times a day (BID) | ORAL | 0 refills | Status: AC

## 2021-09-10 NOTE — Addendum Note (Signed)
Addended by: Caleen Jobs B on: 09/10/2021 08:19 AM   Modules accepted: Orders

## 2021-09-10 NOTE — Progress Notes (Signed)
Your chest xray did show  some changes and infiltrates which could suggest potential pneumonia - I'm going to go ahead and give you a course of antibiotics. We will need to repeat the chest xray in 7-10 days (after you've completed antibiotics). If there are still abnormal findings, it is recommended we get a CT scan to further evaluate. I will go ahead and order the chest xray so that you can have it done towards the middle to end of next week.

## 2021-09-12 ENCOUNTER — Telehealth: Payer: Self-pay | Admitting: Cardiology

## 2021-09-12 ENCOUNTER — Telehealth: Payer: Self-pay | Admitting: Family Medicine

## 2021-09-12 DIAGNOSIS — N186 End stage renal disease: Secondary | ICD-10-CM | POA: Diagnosis not present

## 2021-09-12 DIAGNOSIS — D631 Anemia in chronic kidney disease: Secondary | ICD-10-CM | POA: Diagnosis not present

## 2021-09-12 DIAGNOSIS — D509 Iron deficiency anemia, unspecified: Secondary | ICD-10-CM | POA: Diagnosis not present

## 2021-09-12 DIAGNOSIS — N2581 Secondary hyperparathyroidism of renal origin: Secondary | ICD-10-CM | POA: Diagnosis not present

## 2021-09-12 NOTE — Telephone Encounter (Signed)
New Message:     Patient says she needs Dr Warren Lacy to order an Echo please. She saw her PA at her  primary doctor on 8-The PA said she need to have an Echo, patient have been having some shortness of breath.   Pt c/o Shortness Of Breath: STAT if SOB developed within the last 24 hours or pt is noticeably SOB on the phone  1. Are you currently SOB (can you hear that pt is SOB on the phone)? Patient says she s short of breath at this time  2. How long have you been experiencing SOB? About 3 week  3. Are you SOB when sitting or when up moving around? More when she is walking and trying to do something  4. Are you currently experiencing any other symptoms? Very,very tired

## 2021-09-12 NOTE — Telephone Encounter (Signed)
Patient called to advise that her cardiologist did not have the order for the echocardiogram she was supposed to have. Patient requested order to be placed. Please call patient to advise.

## 2021-09-12 NOTE — Telephone Encounter (Signed)
LMOM notifying pt that she is due to follow up with her cardiologist and he is the one that will be ordering the echo.

## 2021-09-12 NOTE — Telephone Encounter (Signed)
Spoke to patient. She was at dialysis. She states she has been short of breath. Even after  dialysis.patient states they pull enough fluid off during dialysis.  She states she goes [place], Friday to dialysis. Schedule appointment for Thursday 09/18/21. Patient verbalized understanding.

## 2021-09-15 DIAGNOSIS — Z7901 Long term (current) use of anticoagulants: Secondary | ICD-10-CM | POA: Diagnosis not present

## 2021-09-15 DIAGNOSIS — D509 Iron deficiency anemia, unspecified: Secondary | ICD-10-CM | POA: Diagnosis not present

## 2021-09-15 DIAGNOSIS — N186 End stage renal disease: Secondary | ICD-10-CM | POA: Diagnosis not present

## 2021-09-15 DIAGNOSIS — N2581 Secondary hyperparathyroidism of renal origin: Secondary | ICD-10-CM | POA: Diagnosis not present

## 2021-09-15 DIAGNOSIS — D631 Anemia in chronic kidney disease: Secondary | ICD-10-CM | POA: Diagnosis not present

## 2021-09-17 DIAGNOSIS — D509 Iron deficiency anemia, unspecified: Secondary | ICD-10-CM | POA: Diagnosis not present

## 2021-09-17 DIAGNOSIS — N186 End stage renal disease: Secondary | ICD-10-CM | POA: Diagnosis not present

## 2021-09-17 DIAGNOSIS — N2581 Secondary hyperparathyroidism of renal origin: Secondary | ICD-10-CM | POA: Diagnosis not present

## 2021-09-17 DIAGNOSIS — D631 Anemia in chronic kidney disease: Secondary | ICD-10-CM | POA: Diagnosis not present

## 2021-09-17 NOTE — Progress Notes (Unsigned)
Cardiology Clinic Note   Patient Name: Jillian Hayes Date of Encounter: 09/18/2021  Primary Care Provider:  [name], MD Primary Cardiologist:  Minus Breeding, MD  Patient Profile    Jillian Hayes 76 year old female presents the clinic today for evaluation of shortness of breath.  Past Medical History    Past Medical History:  Diagnosis Date   Advanced care planning/counseling discussion 06/10/2014   05/31/2014 patient presents copy of HCP and Living Will   Anemia    iron deficiency   Anxiety    Arthritis    "in hands"   Atrial fibrillation ([place])    Benign fundic gland polyps of stomach    Bladder polyps 06/25/2010   Chronic headaches 81/82/9937   Complication of anesthesia    "woke up at the end of a cyst removal surgery in 1991"   Depression 1991   hospitalized   Diverticulosis    ESRD (end stage renal disease) ([place]) 11/09/2015   HD on MWF   External prolapsed hemorrhoids    History of chicken pox 06/25/2010   Hypercalcemia 02/18/2014   Hypertension    Insomnia 06/24/2010   Multiple chemical sensitivity syndrome 06/25/2010   Proteinuria 02/18/2014   Renal insufficiency 03/26/2011   Valvular heart disease 04/28/2016   Past Surgical History:  Procedure Laterality Date   AV FISTULA PLACEMENT Left    x4   COLONOSCOPY  2014   cyst on left breast removed Left 1991   benign   ESOPHAGOGASTRODUODENOSCOPY (EGD) WITH ESOPHAGEAL DILATION  2014   LAPAROSCOPIC BILATERAL SALPINGO OOPHERECTOMY Bilateral 08/20/2020   Procedure: LAPAROSCOPIC BILATERAL SALPINGO OOPHORECTOMY;  Surgeon: Megan Salon, MD;  Location: [place];  Service: Gynecology;  Laterality: Bilateral;   NASAL SEPTUM SURGERY  1986   rhinoplasty   polyps on bladder removed  1972   benign   RIGHT HEART CATH N/A 04/10/2021   Procedure: RIGHT HEART CATH;  Surgeon: Larey Dresser, MD;  Location: [place] [facility];  Service: Cardiovascular;  Laterality: N/A;   TONSILLECTOMY  1962     Allergies  Allergies  Allergen Reactions   Sulfa Antibiotics Nausea And Vomiting   Clonidine Derivatives Palpitations    History of Present Illness    Jillian Hayes has a PMH of hypertension, aortic valve disease, atrial fibrillation, mitral valve regurgitation, pulmonary hypertension, hearing loss, dermatitis, degenerative disc disease, anemia, depression, fatigue, chronic headaches, hypercalcemia, palpitation, hyperlipidemia, leg swelling, hyperphosphatemia, and low TSH.  She underwent nuclear stress testing which was negative after a abnormal EKG and borderline plain old exercise treadmill.  She was admitted 9/23 - 10/27/2017 for atrial fibrillation with RVR.  She spontaneously converted to NSR and was started on Coumadin and diltiazem.  Her echocardiogram showed MR and aortic insufficiency.  She she had a right heart cath which showed borderline pulmonary hypertension.  She was seen by Dr. Percival Spanish on 05/01/2021.  During that time she reported that she was on the kidney transplant list.  She had been offered a kidney however, she turned down because of it was not rated highly on the daughter scale.  She denied chest discomfort, neck and arm discomfort she denied new shortness of breath, PND and orthopnea.  She denied palpitations, presyncope and syncope.  She contacted the nurse triage line on 1123.  She was at dialysis.  She reported shortness of breath.  She reported that they were able to pull fluid off during her session.  She reported compliance with Monday Wednesday Friday  sessions.  She presents to the clinic today for follow-up evaluation states she for the last month has noticed increased fatigue and shortness of breath.  She reports compliance with hemodialysis Monday Wednesday Friday.  She is completing normal length sessions.  We reviewed her lab work from 8-23 and it is all stable for her.  She reports compliance with her medications.  She does notice labile blood pressure.   Initially today her blood pressure is 154/70 and on recheck is 134/62.  We reviewed her previous echocardiogram.  I will order echocardiogram, have her continue her normal activities, and and after testing.  Today she denies chest pain, lower extremity edema, fatigue, palpitations, melena, hematuria, hemoptysis, diaphoresis, weakness, presyncope, syncope, orthopnea, and PND.   Home Medications    Prior to Admission medications   Medication Sig Start Date End Date Taking? Authorizing Provider  acetaminophen (TYLENOL) 500 MG tablet Take 500 mg by mouth every 6 (six) hours as needed for headache (pain).    [provider]  Alfalfa 500 MG TABS Take 500 mg by mouth 3 (three) times daily.    [provider]  atorvastatin (LIPITOR) 20 MG tablet Take 20 mg by mouth at bedtime.    [provider]  carvedilol (COREG) 25 MG tablet Take 1 tablet by mouth twice daily 05/29/21   Minus Breeding, MD  cinacalcet (SENSIPAR) 30 MG tablet Take 30 mg by mouth at bedtime.    [provider]  diltiazem (CARDIZEM CD) 240 MG 24 hr capsule Take 240 mg by mouth daily with lunch.    [provider]  Flaxseed, Linseed, (FLAX SEED OIL) 1000 MG CAPS Take 1,000 mg by mouth every evening.    [provider]  hydrALAZINE (APRESOLINE) 50 MG tablet Take 75 mg by mouth 2 (two) times daily. 03/23/20   [provider]  L-Glutamine 500 MG CAPS Take 500 mg by mouth every evening.    [provider]  lidocaine-prilocaine (EMLA) cream Apply 1 application. topically every Monday, Wednesday, and Friday with hemodialysis. 03/16/20   [provider]  OVER THE COUNTER MEDICATION Take 3 capsules by mouth every evening. Shaklee Herb-Lax Natural Laxative for Colon Cleanse (Senna, Licorice, and Alfalfa)    [provider]  Probiotic Product (PROBIOTIC DAILY PO) Take 1 capsule by mouth in the morning.    [provider]  sevelamer carbonate (RENVELA)  800 MG tablet Take 2,400 mg by mouth See admin instructions. Take 3 tablets (2400 mg) by mouth with each meal & take 2 tablets (1600 mg) by mouth with each large snack 09/07/18   [provider]  vitamin C (ASCORBIC ACID) 500 MG tablet Take 500 mg by mouth daily.    [provider]  Vitamin E 400 units TABS Take 400 Units by mouth daily with lunch.    [provider]  warfarin (COUMADIN) 1 MG tablet Take 1 mg by mouth every Tuesday, Thursday, and Saturday at 6 PM. 03/04/21   [provider]  warfarin (COUMADIN) 5 MG tablet Take 1 tablet (5 mg total) by mouth daily at 4 PM. ONCE DAILY AS DIRECTED Patient taking differently: Take 5 mg by mouth every evening. 08/20/20   Megan Salon, MD    Family History    Family History  Problem Relation Age of Onset   Breast cancer Mother 15       left breast removed, 2010 lung   Diabetes Mother    Nephrolithiasis Father    Heart attack  Father 31       X 3   Heart disease Father        smoker   Hypertension Brother    Allergic rhinitis Brother    Melanoma Son 43       melanoma on leg removed   Pancreatic cancer Maternal Grandmother    Hypertension Paternal Grandmother    Obesity Paternal Grandmother    Heart attack Paternal Grandfather 34   Diabetes Maternal Aunt        x 2   Diabetes Maternal Uncle        x 3   Asthma Neg Hx    Eczema Neg Hx    Urticaria Neg Hx    Immunodeficiency Neg Hx    Angioedema Neg Hx    She indicated that her mother is deceased. She indicated that her father is deceased. She indicated that both of her brothers are alive. She indicated that her maternal grandmother is deceased. She indicated that her maternal grandfather is deceased. She indicated that her paternal grandmother is deceased. She indicated that her paternal grandfather is deceased. She indicated that her daughter is alive. She indicated that her son is alive. She indicated that the status of her maternal aunt is unknown.  She indicated that the status of her maternal uncle is unknown. She indicated that the status of her neg hx is unknown.  Social History    Social History   Socioeconomic History   Marital status: Married    Spouse name: Not on file   Number of children: 2   Years of education: Not on file   Highest education level: Not on file  Occupational History   Occupation: retired  Tobacco Use   Smoking status: Never   Smokeless tobacco: Never  Vaping Use   Vaping Use: Never used  Substance and Sexual Activity   Alcohol use: No   Drug use: No   Sexual activity: Yes    Partners: Male  Other Topics Concern   Not on file  Social History Narrative   Married and lives with husband.     Retired   1 son and 1 daughter   No alcohol tobacco or caffeine   Social Determinants of Health   Financial Resource Strain: Low Risk  (01/30/2021)   Overall Financial Resource Strain (CARDIA)    Difficulty of Paying Living Expenses: Not hard at all  Food Insecurity: No Food Insecurity (01/30/2021)   Hunger Vital Sign    Worried About Running Out of Food in the Last Year: Never true    Ran Out of Food in the Last Year: Never true  Transportation Needs: No Transportation Needs (01/30/2021)   PRAPARE - Hydrologist (Medical): No    Lack of Transportation (Non-Medical): No  Physical Activity: Insufficiently Active (01/30/2021)   Exercise Vital Sign    Days of Exercise per Week: 7 days    Minutes of Exercise per Session: 20 min  Stress: No Stress Concern Present (01/30/2021)   [place]    Feeling of Stress : Not at all  Social Connections: Moderately Integrated (01/30/2021)   Social Connection and Isolation Panel [NHANES]    Frequency of Communication with Friends and Family: More than three times a week    Frequency of Social Gatherings with Friends and Family: More than three times a week    Attends  Religious Services: More than 4 times per year  Active Member of Clubs or Organizations: No    Attends Archivist Meetings: Never    Marital Status: Married  Human resources officer Violence: Not At Risk (01/30/2021)   Humiliation, Afraid, Rape, and Kick questionnaire    Fear of Current or Ex-Partner: No    Emotionally Abused: No    Physically Abused: No    Sexually Abused: No     Review of Systems    General:  No chills, fever, night sweats or weight changes.  Cardiovascular:  No chest pain, dyspnea on exertion, edema, orthopnea, palpitations, paroxysmal nocturnal dyspnea. Dermatological: No rash, lesions/masses Respiratory: No cough, dyspnea Urologic: No hematuria, dysuria Abdominal:   No nausea, vomiting, diarrhea, bright red blood per rectum, melena, or hematemesis Neurologic:  No visual changes, wkns, changes in mental status. All other systems reviewed and are otherwise negative except as noted above.  Physical Exam    VS:  BP 134/62   Pulse 70   Ht '5\' 6"'$  (1.676 m)   Wt 130 lb 9.6 oz (59.2 kg)   BMI 21.08 kg/m  , BMI Body mass index is 21.08 kg/m. GEN: Well nourished, well developed, in no acute distress. HEENT: normal. Neck: Supple, no JVD, carotid bruits, or masses. Cardiac: RRR, 4/6 systolic murmur heard along left sternal border and apex., rubs, or gallops. No clubbing, cyanosis, edema.  Radials/DP/PT 2+ and equal bilaterally.  Respiratory:  Respirations regular and unlabored, clear to auscultation bilaterally. GI: Soft, nontender, nondistended, BS + x 4. MS: no deformity or atrophy. Skin: warm and dry, no rash. Neuro:  Strength and sensation are intact. Psych: Normal affect.  Accessory Clinical Findings    Recent Labs: 04/01/2021: BUN 35; Creatinine, Ser 5.16; Platelets 212 04/10/2021: Hemoglobin 11.6; Hemoglobin 11.9; Potassium 4.3; Potassium 4.3; Sodium 138; Sodium 139 06/19/2021: Magnesium 2.3; TSH 0.23   Recent Lipid Panel    Component Value  Date/Time   CHOL 124 06/19/2021 1221   TRIG 76.0 06/19/2021 1221   HDL 89.10 06/19/2021 1221   CHOLHDL 1 06/19/2021 1221   VLDL 15.2 06/19/2021 1221   LDLCALC 19 06/19/2021 1221    ECG personally reviewed by me today-normal sinus rhythm voltage criteria for left ventricular hypertrophy 63 bpm- No acute changes  Echocardiogram 04/13/2019  IMPRESSIONS     1. Left ventricular ejection fraction, by estimation, is 60 to 65%. The  left ventricle has normal function. The left ventricle has no regional  wall motion abnormalities. There is moderate concentric left ventricular  hypertrophy. Left ventricular  diastolic parameters are consistent with Grade I diastolic dysfunction  (impaired relaxation).   2. Right ventricular systolic function is normal. The right ventricular  size is normal. There is moderately elevated pulmonary artery systolic  pressure. The estimated right ventricular systolic pressure is 03.5 mmHg.   3. Left atrial size was severely dilated.   4. Right atrial size was mild to moderately dilated.   5. The mitral valve is normal in structure. Mild to moderate mitral valve  regurgitation. No evidence of mitral stenosis.   6. Tricuspid valve regurgitation is mild to moderate.   7. The aortic valve is tricuspid. Aortic valve regurgitation is mild to  moderate. Mild aortic valve sclerosis is present, with no evidence of  aortic valve stenosis.   8. The inferior vena cava is dilated in size with >50% respiratory  variability, suggesting right atrial pressure of 8 mmHg.   Comparison(s): A prior study was performed on 10/26/2017. Prior images  reviewed side by side. No significant  change in degree of aortic valve  regurgitation. Mitral regurgitation has increased.  Assessment & Plan   1.  Shortness of breath/DOE, cardiac murmur-contacted nurse triage line on 09/12/2021.  During that time she reported compliance with dialysis and noted to increased shortness of breath.  Labs  stable from dialysis 8-23, reviewed. Continue HD Monday Wednesday Friday Heart healthy low-sodium diet-salty 6 given Increase physical activity as tolerated Order echocardiogram  Pulmonary hypertension-underwent right heart cath which showed mildly elevated pulmonary hypertension. Continue weight loss Heart healthy low-sodium diet-salty 6 given Increase physical activity as tolerated   Essential hypertension-BP today 134/62 Continue carvedilol, hydralazine and Heart healthy low-sodium diet-salty 6 given Increase physical activity as tolerated  Paroxysmal atrial fibrillation-EKG today shows normal sinus rhythm with voltage criteria for LVH 63 bpm Continue carvedilol, diltiazem, Coumadin Heart healthy low-sodium diet-salty 6 given Increase physical activity as tolerated   Aortic valve insufficiency-echocardiogram 04/13/2019 showed an LVEF of 60 to 65%, G1 DD, and mild-moderate regurgitation of the aortic valve with mild aortic valve sclerosis and no evidence of stenosis.  Disposition: Follow-up with Dr. Percival Spanish or me in 1-2 months.   Jossie Ng. Takiesha Mcdevitt NP-C     09/18/2021, 3:10 PM [address] Office [phone] Fax [phone]  Notice: This dictation was prepared with Dragon dictation along with smaller phrase technology. Any transcriptional errors that result from this process are unintentional and may not be corrected upon review.  I spent 14 minutes examining this patient, reviewing medications, and using patient centered shared decision making involving her cardiac care.  Prior to her visit I spent greater than 20 minutes reviewing her past medical history,  medications, and prior cardiac tests.

## 2021-09-18 ENCOUNTER — Encounter: Payer: Self-pay | Admitting: General Practice

## 2021-09-18 ENCOUNTER — Ambulatory Visit: Payer: Medicare HMO | Admitting: General Practice

## 2021-09-18 VITALS — BP 134/62 | HR 70 | Ht 66.0 in | Wt 130.6 lb

## 2021-09-18 DIAGNOSIS — I359 Nonrheumatic aortic valve disorder, unspecified: Secondary | ICD-10-CM | POA: Diagnosis not present

## 2021-09-18 DIAGNOSIS — R011 Cardiac murmur, unspecified: Secondary | ICD-10-CM

## 2021-09-18 DIAGNOSIS — I480 Paroxysmal atrial fibrillation: Secondary | ICD-10-CM

## 2021-09-18 DIAGNOSIS — R0609 Other forms of dyspnea: Secondary | ICD-10-CM

## 2021-09-18 DIAGNOSIS — I272 Pulmonary hypertension, unspecified: Secondary | ICD-10-CM | POA: Diagnosis not present

## 2021-09-18 DIAGNOSIS — I10 Essential (primary) hypertension: Secondary | ICD-10-CM | POA: Diagnosis not present

## 2021-09-18 NOTE — Patient Instructions (Signed)
Medication Instructions:  The current medical regimen is effective;  continue present plan and medications as directed. Please refer to the Current Medication list given to you today.  *If you need a refill on your cardiac medications before your next appointment, please call your pharmacy*  Lab Work:    NONE     If you have labs (blood work) drawn today and your tests are completely normal, you will receive your results only by:  1-MyChart Message (if you have MyChart) OR  2-A paper copy in the mail.  If you have any lab test that is abnormal or we need to change your treatment, we will call you to review the results.  Testing/Procedures:  Echocardiogram - Your physician has requested that you have an echocardiogram. Echocardiography is a painless test that uses sound waves to create images of your heart. It provides your doctor with information about the size and shape of your heart and how well your heart's chambers and valves are working. This procedure takes approximately one hour. There are no restrictions for this procedure.   Follow-Up: Your next appointment:  2 week(s) In Person with Minus Breeding, MD  or Coletta Memos, FNP  AFTER ECHO  At [facility], you and your health needs are our priority.  As part of our continuing mission to provide you with exceptional heart care, we have created designated Provider Care Teams.  These Care Teams include your primary Cardiologist (physician) and Advanced Practice Providers (APPs -  Physician Assistants and Nurse Practitioners) who all work together to provide you with the care you need, when you need it.  Important Information About Sugar

## 2021-09-19 DIAGNOSIS — D509 Iron deficiency anemia, unspecified: Secondary | ICD-10-CM | POA: Diagnosis not present

## 2021-09-19 DIAGNOSIS — N186 End stage renal disease: Secondary | ICD-10-CM | POA: Diagnosis not present

## 2021-09-19 DIAGNOSIS — D631 Anemia in chronic kidney disease: Secondary | ICD-10-CM | POA: Diagnosis not present

## 2021-09-19 DIAGNOSIS — N2581 Secondary hyperparathyroidism of renal origin: Secondary | ICD-10-CM | POA: Diagnosis not present

## 2021-09-22 DIAGNOSIS — N2581 Secondary hyperparathyroidism of renal origin: Secondary | ICD-10-CM | POA: Diagnosis not present

## 2021-09-22 DIAGNOSIS — N186 End stage renal disease: Secondary | ICD-10-CM | POA: Diagnosis not present

## 2021-09-22 DIAGNOSIS — D509 Iron deficiency anemia, unspecified: Secondary | ICD-10-CM | POA: Diagnosis not present

## 2021-09-22 DIAGNOSIS — D631 Anemia in chronic kidney disease: Secondary | ICD-10-CM | POA: Diagnosis not present

## 2021-09-22 DIAGNOSIS — Z7901 Long term (current) use of anticoagulants: Secondary | ICD-10-CM | POA: Diagnosis not present

## 2021-09-22 NOTE — Addendum Note (Signed)
Addended by: Waylan Rocher on: 09/22/2021 09:05 AM   Modules accepted: Orders

## 2021-09-24 DIAGNOSIS — D509 Iron deficiency anemia, unspecified: Secondary | ICD-10-CM | POA: Diagnosis not present

## 2021-09-24 DIAGNOSIS — N186 End stage renal disease: Secondary | ICD-10-CM | POA: Diagnosis not present

## 2021-09-24 DIAGNOSIS — D631 Anemia in chronic kidney disease: Secondary | ICD-10-CM | POA: Diagnosis not present

## 2021-09-24 DIAGNOSIS — N2581 Secondary hyperparathyroidism of renal origin: Secondary | ICD-10-CM | POA: Diagnosis not present

## 2021-09-26 DIAGNOSIS — D509 Iron deficiency anemia, unspecified: Secondary | ICD-10-CM | POA: Diagnosis not present

## 2021-09-26 DIAGNOSIS — D631 Anemia in chronic kidney disease: Secondary | ICD-10-CM | POA: Diagnosis not present

## 2021-09-26 DIAGNOSIS — N186 End stage renal disease: Secondary | ICD-10-CM | POA: Diagnosis not present

## 2021-09-26 DIAGNOSIS — N2581 Secondary hyperparathyroidism of renal origin: Secondary | ICD-10-CM | POA: Diagnosis not present

## 2021-09-29 DIAGNOSIS — N186 End stage renal disease: Secondary | ICD-10-CM | POA: Diagnosis not present

## 2021-09-29 DIAGNOSIS — D509 Iron deficiency anemia, unspecified: Secondary | ICD-10-CM | POA: Diagnosis not present

## 2021-09-29 DIAGNOSIS — D631 Anemia in chronic kidney disease: Secondary | ICD-10-CM | POA: Diagnosis not present

## 2021-09-29 DIAGNOSIS — N2581 Secondary hyperparathyroidism of renal origin: Secondary | ICD-10-CM | POA: Diagnosis not present

## 2021-09-29 DIAGNOSIS — Z7901 Long term (current) use of anticoagulants: Secondary | ICD-10-CM | POA: Diagnosis not present

## 2021-09-30 ENCOUNTER — Ambulatory Visit (HOSPITAL_COMMUNITY): Payer: Medicare HMO | Attending: Cardiovascular Disease

## 2021-09-30 DIAGNOSIS — R0609 Other forms of dyspnea: Secondary | ICD-10-CM | POA: Diagnosis not present

## 2021-09-30 DIAGNOSIS — H903 Sensorineural hearing loss, bilateral: Secondary | ICD-10-CM | POA: Diagnosis not present

## 2021-09-30 DIAGNOSIS — H6982 Other specified disorders of Eustachian tube, left ear: Secondary | ICD-10-CM | POA: Diagnosis not present

## 2021-09-30 LAB — ECHOCARDIOGRAM COMPLETE
Area-P 1/2: 3.34 cm2
P 1/2 time: 400 msec
S' Lateral: 3.1 cm

## 2021-10-01 DIAGNOSIS — N186 End stage renal disease: Secondary | ICD-10-CM | POA: Diagnosis not present

## 2021-10-01 DIAGNOSIS — N2581 Secondary hyperparathyroidism of renal origin: Secondary | ICD-10-CM | POA: Diagnosis not present

## 2021-10-01 DIAGNOSIS — D509 Iron deficiency anemia, unspecified: Secondary | ICD-10-CM | POA: Diagnosis not present

## 2021-10-01 DIAGNOSIS — D631 Anemia in chronic kidney disease: Secondary | ICD-10-CM | POA: Diagnosis not present

## 2021-10-02 ENCOUNTER — Other Ambulatory Visit (HOSPITAL_COMMUNITY): Payer: Medicare HMO

## 2021-10-02 DIAGNOSIS — Z992 Dependence on renal dialysis: Secondary | ICD-10-CM | POA: Diagnosis not present

## 2021-10-02 DIAGNOSIS — N186 End stage renal disease: Secondary | ICD-10-CM | POA: Diagnosis not present

## 2021-10-03 DIAGNOSIS — D509 Iron deficiency anemia, unspecified: Secondary | ICD-10-CM | POA: Diagnosis not present

## 2021-10-03 DIAGNOSIS — D631 Anemia in chronic kidney disease: Secondary | ICD-10-CM | POA: Diagnosis not present

## 2021-10-03 DIAGNOSIS — N186 End stage renal disease: Secondary | ICD-10-CM | POA: Diagnosis not present

## 2021-10-03 DIAGNOSIS — N2581 Secondary hyperparathyroidism of renal origin: Secondary | ICD-10-CM | POA: Diagnosis not present

## 2021-10-06 DIAGNOSIS — D631 Anemia in chronic kidney disease: Secondary | ICD-10-CM | POA: Diagnosis not present

## 2021-10-06 DIAGNOSIS — D509 Iron deficiency anemia, unspecified: Secondary | ICD-10-CM | POA: Diagnosis not present

## 2021-10-06 DIAGNOSIS — Z7901 Long term (current) use of anticoagulants: Secondary | ICD-10-CM | POA: Diagnosis not present

## 2021-10-06 DIAGNOSIS — N186 End stage renal disease: Secondary | ICD-10-CM | POA: Diagnosis not present

## 2021-10-06 DIAGNOSIS — N2581 Secondary hyperparathyroidism of renal origin: Secondary | ICD-10-CM | POA: Diagnosis not present

## 2021-10-07 ENCOUNTER — Ambulatory Visit (INDEPENDENT_AMBULATORY_CARE_PROVIDER_SITE_OTHER): Payer: Medicare HMO | Admitting: Family

## 2021-10-07 VITALS — BP 122/76 | HR 74 | Temp 97.6°F | Resp 18 | Ht 66.0 in | Wt 129.6 lb

## 2021-10-07 DIAGNOSIS — G4700 Insomnia, unspecified: Secondary | ICD-10-CM | POA: Diagnosis not present

## 2021-10-07 MED ORDER — TRAZODONE HCL 50 MG PO TABS: 25.0000 mg | ORAL_TABLET | Freq: Every evening | ORAL | 1 refills | Status: DC | PRN

## 2021-10-07 NOTE — Progress Notes (Signed)
JOHNANNA BAKKE is a 76 y.o. female with the following history as recorded in Epic[facility]:  Patient Active Problem List   Diagnosis Date Noted   Pulmonary hypertension, primary ([place]) 03/09/2021   Low TSH level 03/09/2021   Right knee pain 03/06/2021   Primary osteoarthritis of right knee 02/25/2021   Adnexal mass    A-V fistula ([place]) 08/03/2019   Nonrheumatic mitral valve regurgitation 06/12/2019   Edema 04/09/2019   Drug-induced constipation 10/18/2018   External prolapsed hemorrhoids 09/25/2018   Hyperphosphatemia 03/09/2018   Leg swelling 03/03/2018   Long term (current) use of anticoagulants 11/01/2017   Atrial fibrillation ([place]) 10/25/2017   Hyperparathyroidism, unspecified ([place]) 05/04/2017   DDD (degenerative disc disease), lumbar 04/07/2017   Hearing loss 01/04/2017   Hyperlipidemia 01/04/2017   Arthritis of fingers of both hands 10/01/2016   Chronic rhinitis 10/01/2016   Nonrheumatic aortic valve insufficiency 09/29/2016   Bruit 09/29/2016   Palpitation 09/22/2016   Aortic valve disease 04/28/2016   Anemia in ESRD (end-stage renal disease) ([place]) 03/03/2016   Sun-damaged skin 01/21/2016   ESRD (end stage renal disease) ([place]) 11/09/2015   Advanced care planning/counseling discussion 06/10/2014   Pedal edema 03/28/2014   Proteinuria 02/18/2014   Hypercalcemia 02/18/2014   Abnormal EKG 01/22/2014   HTN (hypertension) 01/22/2014   Dermatitis 06/30/2013   Abnormal thyroid function test 06/25/2013   Cervical cancer screening 06/10/2012   Preventative health care 11/09/2011   Multiple chemical sensitivity syndrome 06/25/2010   History of chicken pox 06/25/2010   Bladder polyps 06/25/2010   History of cardiac dysrhythmia 06/25/2010   Insomnia 06/24/2010   Fatigue 06/24/2010   Chronic headaches 06/24/2010   Allergy    Anemia    Depression     Current Outpatient Medications  Medication Sig Dispense Refill   acetaminophen (TYLENOL) 500 MG tablet Take 500 mg by mouth  every 6 (six) hours as needed for headache (pain).     Alfalfa 500 MG TABS Take 500 mg by mouth 3 (three) times daily.     atorvastatin (LIPITOR) 20 MG tablet Take 20 mg by mouth at bedtime.     carvedilol (COREG) 25 MG tablet Take 1 tablet by mouth twice daily 180 tablet 2   cinacalcet (SENSIPAR) 30 MG tablet Take 30 mg by mouth at bedtime.     diltiazem (CARDIZEM CD) 240 MG 24 hr capsule Take 240 mg by mouth daily with lunch.     Flaxseed, Linseed, (FLAX SEED OIL) 1000 MG CAPS Take 1,000 mg by mouth every evening.     hydrALAZINE (APRESOLINE) 50 MG tablet Take 75 mg by mouth 2 (two) times daily.     L-Glutamine 500 MG CAPS Take 500 mg by mouth every evening.     lidocaine-prilocaine (EMLA) cream Apply 1 application. topically every Monday, Wednesday, and Friday with hemodialysis.     OVER THE COUNTER MEDICATION Take 3 capsules by mouth every evening. Shaklee Herb-Lax Natural Laxative for Colon Cleanse (Senna, Licorice, and Alfalfa)     Probiotic Product (PROBIOTIC DAILY PO) Take 1 capsule by mouth in the morning.     sevelamer carbonate (RENVELA) 800 MG tablet Take 2,400 mg by mouth See admin instructions. Take 3 tablets (2400 mg) by mouth with each meal & take 2 tablets (1600 mg) by mouth with each large snack     traZODone (DESYREL) 50 MG tablet Take 0.5-1 tablets (25-50 mg total) by mouth at bedtime as needed for sleep. 30 tablet 1   vitamin C (ASCORBIC ACID)  500 MG tablet Take 500 mg by mouth daily.     Vitamin E 400 units TABS Take 400 Units by mouth daily with lunch.     warfarin (COUMADIN) 1 MG tablet Take 1 mg by mouth every Tuesday, Thursday, and Saturday at 6 PM.     warfarin (COUMADIN) 5 MG tablet Take 1 tablet (5 mg total) by mouth daily at 4 PM. ONCE DAILY AS DIRECTED (Patient taking differently: Take 5 mg by mouth every evening.) 30 tablet 0   Current Facility-Administered Medications  Medication Dose Route Frequency Provider Last Rate Last Admin   triamcinolone acetonide  (KENALOG-40) injection 40 mg  40 mg Intra-articular Once Rosemarie Ax, MD        Allergies: Sulfa antibiotics and Clonidine derivatives  Past Medical History:  Diagnosis Date   Advanced care planning/counseling discussion 06/10/2014   05/31/2014 patient presents copy of HCP and Living Will   Anemia    iron deficiency   Anxiety    Arthritis    "in hands"   Atrial fibrillation ([place])    Benign fundic gland polyps of stomach    Bladder polyps 06/25/2010   Chronic headaches 43/15/4008   Complication of anesthesia    "woke up at the end of a cyst removal surgery in 1991"   Depression 1991   hospitalized   Diverticulosis    ESRD (end stage renal disease) ([place]) 11/09/2015   HD on MWF   External prolapsed hemorrhoids    History of chicken pox 06/25/2010   Hypercalcemia 02/18/2014   Hypertension    Insomnia 06/24/2010   Multiple chemical sensitivity syndrome 06/25/2010   Proteinuria 02/18/2014   Renal insufficiency 03/26/2011   Valvular heart disease 04/28/2016    Past Surgical History:  Procedure Laterality Date   AV FISTULA PLACEMENT Left    x4   COLONOSCOPY  2014   cyst on left breast removed Left 1991   benign   ESOPHAGOGASTRODUODENOSCOPY (EGD) WITH ESOPHAGEAL DILATION  2014   LAPAROSCOPIC BILATERAL SALPINGO OOPHERECTOMY Bilateral 08/20/2020   Procedure: LAPAROSCOPIC BILATERAL SALPINGO OOPHORECTOMY;  Surgeon: Megan Salon, MD;  Location: [facility];  Service: Gynecology;  Laterality: Bilateral;   NASAL SEPTUM SURGERY  1986   rhinoplasty   polyps on bladder removed  1972   benign   RIGHT HEART CATH N/A 04/10/2021   Procedure: RIGHT HEART CATH;  Surgeon: Larey Dresser, MD;  Location: [facility];  Service: Cardiovascular;  Laterality: N/A;   TONSILLECTOMY  1962    Family History  Problem Relation Age of Onset   Breast cancer Mother 42       left breast removed, 2010 lung   Diabetes Mother    Nephrolithiasis Father    Heart attack Father 7       X 3   Heart  disease Father        smoker   Hypertension Brother    Allergic rhinitis Brother    Melanoma Son 43       melanoma on leg removed   Pancreatic cancer Maternal Grandmother    Hypertension Paternal Grandmother    Obesity Paternal Grandmother    Heart attack Paternal Grandfather 43   Diabetes Maternal Aunt        x 2   Diabetes Maternal Uncle        x 3   Asthma Neg Hx    Eczema Neg Hx    Urticaria Neg Hx    Immunodeficiency Neg Hx  Angioedema Neg Hx     Social History   Tobacco Use   Smoking status: Never   Smokeless tobacco: Never  Substance Use Topics   Alcohol use: No    Subjective:   Worsening insomnia; symptoms have been present for at least the past year; feels that they have worsened considerably lately; thought to be related to dialysis but nephrology was not able to provide any information other than Benadryl or Melatonin which patient has not been able to tolerate; her PCP offered Amytriptyline last year for same symptoms but patient declined due to concerns for side effects     Objective:  Vitals:   10/07/21 1054  BP: 122/76  Pulse: 74  Resp: 18  Temp: 97.6 F (36.4 C)  TempSrc: Temporal  SpO2: 97%  Weight: 129 lb 9.6 oz (58.8 kg)  Height: '5\' 6"'$  (1.676 m)    General: Well developed, well nourished, in no acute distress  Skin : Warm and dry.  Head: Normocephalic and atraumatic  Lungs: Respirations unlabored; clear to auscultation bilaterally without wheeze, rales, rhonchi  CVS exam: murmur noted.  Neurologic: Alert and oriented; speech intact; face symmetrical; moves all extremities well; CNII-XII intact without focal deficit   Assessment:  1. Insomnia, unspecified type     Plan:  Trial of Trazodone 25 mg given; verified in [facility] that no renal adjustment required; she will need to see her PCP with continued symptoms.   No follow-ups on file.  No orders of the defined types were placed in this encounter.   Requested Prescriptions   Signed  Prescriptions Disp Refills   traZODone (DESYREL) 50 MG tablet 30 tablet 1    Sig: Take 0.5-1 tablets (25-50 mg total) by mouth at bedtime as needed for sleep.

## 2021-10-08 DIAGNOSIS — D509 Iron deficiency anemia, unspecified: Secondary | ICD-10-CM | POA: Diagnosis not present

## 2021-10-08 DIAGNOSIS — N2581 Secondary hyperparathyroidism of renal origin: Secondary | ICD-10-CM | POA: Diagnosis not present

## 2021-10-08 DIAGNOSIS — D631 Anemia in chronic kidney disease: Secondary | ICD-10-CM | POA: Diagnosis not present

## 2021-10-08 DIAGNOSIS — N186 End stage renal disease: Secondary | ICD-10-CM | POA: Diagnosis not present

## 2021-10-10 DIAGNOSIS — D631 Anemia in chronic kidney disease: Secondary | ICD-10-CM | POA: Diagnosis not present

## 2021-10-10 DIAGNOSIS — N2581 Secondary hyperparathyroidism of renal origin: Secondary | ICD-10-CM | POA: Diagnosis not present

## 2021-10-10 DIAGNOSIS — N186 End stage renal disease: Secondary | ICD-10-CM | POA: Diagnosis not present

## 2021-10-10 DIAGNOSIS — D509 Iron deficiency anemia, unspecified: Secondary | ICD-10-CM | POA: Diagnosis not present

## 2021-10-13 DIAGNOSIS — Z7901 Long term (current) use of anticoagulants: Secondary | ICD-10-CM | POA: Diagnosis not present

## 2021-10-13 DIAGNOSIS — D631 Anemia in chronic kidney disease: Secondary | ICD-10-CM | POA: Diagnosis not present

## 2021-10-13 DIAGNOSIS — N2581 Secondary hyperparathyroidism of renal origin: Secondary | ICD-10-CM | POA: Diagnosis not present

## 2021-10-13 DIAGNOSIS — N186 End stage renal disease: Secondary | ICD-10-CM | POA: Diagnosis not present

## 2021-10-13 DIAGNOSIS — D509 Iron deficiency anemia, unspecified: Secondary | ICD-10-CM | POA: Diagnosis not present

## 2021-10-14 DIAGNOSIS — H6982 Other specified disorders of Eustachian tube, left ear: Secondary | ICD-10-CM | POA: Diagnosis not present

## 2021-10-15 DIAGNOSIS — N186 End stage renal disease: Secondary | ICD-10-CM | POA: Diagnosis not present

## 2021-10-15 DIAGNOSIS — D509 Iron deficiency anemia, unspecified: Secondary | ICD-10-CM | POA: Diagnosis not present

## 2021-10-15 DIAGNOSIS — D631 Anemia in chronic kidney disease: Secondary | ICD-10-CM | POA: Diagnosis not present

## 2021-10-15 DIAGNOSIS — N2581 Secondary hyperparathyroidism of renal origin: Secondary | ICD-10-CM | POA: Diagnosis not present

## 2021-10-17 DIAGNOSIS — N2581 Secondary hyperparathyroidism of renal origin: Secondary | ICD-10-CM | POA: Diagnosis not present

## 2021-10-17 DIAGNOSIS — D509 Iron deficiency anemia, unspecified: Secondary | ICD-10-CM | POA: Diagnosis not present

## 2021-10-17 DIAGNOSIS — N186 End stage renal disease: Secondary | ICD-10-CM | POA: Diagnosis not present

## 2021-10-17 DIAGNOSIS — D631 Anemia in chronic kidney disease: Secondary | ICD-10-CM | POA: Diagnosis not present

## 2021-10-19 NOTE — Progress Notes (Unsigned)
Cardiology Office Note   Date:  10/21/2021   ID:  Jillian Hayes, DOB [birth year], MRN [id number]  PCP:  [name], MD  Cardiologist:   Minus Breeding, MD  Referring:  [name], MD   Chief Complaint  Patient presents with   Palpitations      History of Present Illness: Jillian Hayes is a 76 y.o. female who presents for follow up of HTN.   She had an abnormal EKG and borderline POET (Plain Old Exercise Treadmill).  I sent her for a perfusion study which was negative for ischemia.  She was admitted 9/23-9/25/2019 for atrial fibrillation, RVR.  She spontaneously converted to sinus rhythm and was started on Cardizem and Coumadin.  Echo showed mod MR and AI.   She had a right heart cath with borderline pulmonary HTN.   Since I last saw her she had increased SOB.  However, she saw Coletta Memos NP and had no acute findings.  She had an echocardiogram which demonstrated well-preserved ejection fraction.  There was moderate to mild AI.  The pulmonary hypertension previously noted was mild to moderate.  Since she was seen she said her shortness of breath is improved.  Her fatigue is baseline she says she only sleeps about 3 hours a night.  She is not having any presyncope or syncope.  She is not having any PND or orthopnea.  She has had no presyncope or syncope.  She will occasionally feel palpitations.   Past Medical History:  Diagnosis Date   Advanced care planning/counseling discussion 06/10/2014   05/31/2014 patient presents copy of HCP and Living Will   Anemia    iron deficiency   Anxiety    Arthritis    "in hands"   Atrial fibrillation ([place])    Benign fundic gland polyps of stomach    Bladder polyps 06/25/2010   Chronic headaches 27/08/8673   Complication of anesthesia    "woke up at the end of a cyst removal surgery in 1991"   Depression 1991   hospitalized   Diverticulosis    ESRD (end stage renal disease) ([place]) 11/09/2015   HD on MWF   External prolapsed  hemorrhoids    History of chicken pox 06/25/2010   Hypercalcemia 02/18/2014   Hypertension    Insomnia 06/24/2010   Multiple chemical sensitivity syndrome 06/25/2010   Proteinuria 02/18/2014   Renal insufficiency 03/26/2011   Valvular heart disease 04/28/2016    Past Surgical History:  Procedure Laterality Date   AV FISTULA PLACEMENT Left    x4   COLONOSCOPY  2014   cyst on left breast removed Left 1991   benign   ESOPHAGOGASTRODUODENOSCOPY (EGD) WITH ESOPHAGEAL DILATION  2014   LAPAROSCOPIC BILATERAL SALPINGO OOPHERECTOMY Bilateral 08/20/2020   Procedure: LAPAROSCOPIC BILATERAL SALPINGO OOPHORECTOMY;  Surgeon: Megan Salon, MD;  Location: [facility];  Service: Gynecology;  Laterality: Bilateral;   NASAL SEPTUM SURGERY  1986   rhinoplasty   polyps on bladder removed  1972   benign   RIGHT HEART CATH N/A 04/10/2021   Procedure: RIGHT HEART CATH;  Surgeon: Larey Dresser, MD;  Location: [facility];  Service: Cardiovascular;  Laterality: N/A;   TONSILLECTOMY  1962     Current Outpatient Medications  Medication Sig Dispense Refill   acetaminophen (TYLENOL) 500 MG tablet Take 500 mg by mouth every 6 (six) hours as needed for headache (pain).     Alfalfa 500 MG TABS Take 500 mg  by mouth 3 (three) times daily.     atorvastatin (LIPITOR) 20 MG tablet Take 20 mg by mouth at bedtime.     carvedilol (COREG) 25 MG tablet Take 1 tablet by mouth twice daily 180 tablet 2   cinacalcet (SENSIPAR) 30 MG tablet Take 30 mg by mouth at bedtime.     diltiazem (CARDIZEM CD) 240 MG 24 hr capsule Take 240 mg by mouth daily with lunch.     Flaxseed, Linseed, (FLAX SEED OIL) 1000 MG CAPS Take 1,000 mg by mouth every evening.     hydrALAZINE (APRESOLINE) 50 MG tablet Take 75 mg by mouth 2 (two) times daily.     L-Glutamine 500 MG CAPS Take 500 mg by mouth every evening.     lidocaine-prilocaine (EMLA) cream Apply 1 application. topically every Monday, Wednesday, and Friday with hemodialysis.      OVER THE COUNTER MEDICATION Take 3 capsules by mouth every evening. Shaklee Herb-Lax Natural Laxative for Colon Cleanse (Senna, Licorice, and Alfalfa)     Probiotic Product (PROBIOTIC DAILY PO) Take 1 capsule by mouth in the morning.     sevelamer carbonate (RENVELA) 800 MG tablet Take 2,400 mg by mouth See admin instructions. Take 3 tablets (2400 mg) by mouth with each meal & take 2 tablets (1600 mg) by mouth with each large snack     traZODone (DESYREL) 50 MG tablet Take 0.5-1 tablets (25-50 mg total) by mouth at bedtime as needed for sleep. 30 tablet 1   vitamin C (ASCORBIC ACID) 500 MG tablet Take 500 mg by mouth daily.     Vitamin E 400 units TABS Take 400 Units by mouth daily with lunch.     warfarin (COUMADIN) 1 MG tablet Take 1 mg by mouth every Tuesday, Thursday, and Saturday at 6 PM.     warfarin (COUMADIN) 5 MG tablet Take 1 tablet (5 mg total) by mouth daily at 4 PM. ONCE DAILY AS DIRECTED (Patient taking differently: Take 5 mg by mouth every evening.) 30 tablet 0   Current Facility-Administered Medications  Medication Dose Route Frequency Provider Last Rate Last Admin   triamcinolone acetonide (KENALOG-40) injection 40 mg  40 mg Intra-articular Once Rosemarie Ax, MD        Allergies:   Sulfa antibiotics and Clonidine derivatives    ROS:  Please see the history of present illness.   Otherwise, review of systems are positive for none  PHYSICAL EXAM: VS:  BP (!) 151/66   Pulse 72   Ht '5\' 6"'$  (1.676 m)   Wt 135 lb 3.2 oz (61.3 kg)   SpO2 94%   BMI 21.82 kg/m  , BMI Body mass index is 21.82 kg/m. GENERAL:  Well appearing NECK:  No jugular venous distention, waveform within normal limits, carotid upstroke brisk and symmetric, no bruits, no thyromegaly LUNGS:  Clear to auscultation bilaterally CHEST:  Unremarkable HEART:  PMI not displaced or sustained,S1 and S2 within normal limits, no S3, no S4, no clicks, no rubs, 2 out of 6 apical systolic murmur radiating slightly at  the aortic outflow tract, no diastolic murmurs ABD:  Flat, positive bowel sounds normal in frequency in pitch, no bruits, no rebound, no guarding, no midline pulsatile mass, no hepatomegaly, no splenomegaly EXT:  2 plus pulses throughout, no edema, no cyanosis no clubbing    EKG:  EKG is not ordered today.   Recent Labs: 04/01/2021: BUN 35; Creatinine, Ser 5.16; Platelets 212 04/10/2021: Hemoglobin 11.6; Hemoglobin 11.9; Potassium 4.3; Potassium 4.3;  Sodium 138; Sodium 139 06/19/2021: Magnesium 2.3; TSH 0.23    Lipid Panel    Component Value Date/Time   CHOL 124 06/19/2021 1221   TRIG 76.0 06/19/2021 1221   HDL 89.10 06/19/2021 1221   CHOLHDL 1 06/19/2021 1221   VLDL 15.2 06/19/2021 1221   LDLCALC 19 06/19/2021 1221      Wt Readings from Last 3 Encounters:  10/21/21 135 lb 3.2 oz (61.3 kg)  10/07/21 129 lb 9.6 oz (58.8 kg)  09/18/21 130 lb 9.6 oz (59.2 kg)      Other studies Reviewed: Additional studies/ records that were reviewed today include: None Review of the above records demonstrates: See elsewhere   ASSESSMENT AND PLAN:  ATRIAL FIB:   Ms. KINZEE HAPPEL has a CHA2DS2 - VASc score of 3.   She has not had any prolonged symptomatic paroxysms.  No change in therapy.   HTN:  The blood pressure is at target.  No change in therapy.   MILD/MODERATE AI:   This was mild to moderate on echo Jan 2023.   I will follow this clinically and probably with an echocardiogram in 2 years.    PULMONARY HTN: I think this is probably class II.  This was mild.  No change in therapy.\   Current medicines are reviewed at length with the patient today.  The patient does not have concerns regarding medicines.  The following changes have been made: None  Labs/ tests ordered today include:   None  No orders of the defined types were placed in this encounter.    Disposition:   FU with me in 12 months.    Signed, Minus Breeding, MD  10/21/2021 2:40 PM    [facility]

## 2021-10-20 DIAGNOSIS — D509 Iron deficiency anemia, unspecified: Secondary | ICD-10-CM | POA: Diagnosis not present

## 2021-10-20 DIAGNOSIS — N2581 Secondary hyperparathyroidism of renal origin: Secondary | ICD-10-CM | POA: Diagnosis not present

## 2021-10-20 DIAGNOSIS — N186 End stage renal disease: Secondary | ICD-10-CM | POA: Diagnosis not present

## 2021-10-20 DIAGNOSIS — Z7901 Long term (current) use of anticoagulants: Secondary | ICD-10-CM | POA: Diagnosis not present

## 2021-10-20 DIAGNOSIS — D631 Anemia in chronic kidney disease: Secondary | ICD-10-CM | POA: Diagnosis not present

## 2021-10-21 ENCOUNTER — Encounter: Payer: Self-pay | Admitting: Cardiology

## 2021-10-21 ENCOUNTER — Ambulatory Visit: Payer: Medicare HMO | Attending: Cardiology | Admitting: Cardiology

## 2021-10-21 VITALS — BP 151/66 | HR 72 | Ht 66.0 in | Wt 135.2 lb

## 2021-10-21 DIAGNOSIS — I480 Paroxysmal atrial fibrillation: Secondary | ICD-10-CM

## 2021-10-21 DIAGNOSIS — I10 Essential (primary) hypertension: Secondary | ICD-10-CM

## 2021-10-21 DIAGNOSIS — I270 Primary pulmonary hypertension: Secondary | ICD-10-CM

## 2021-10-21 DIAGNOSIS — I351 Nonrheumatic aortic (valve) insufficiency: Secondary | ICD-10-CM | POA: Diagnosis not present

## 2021-10-21 NOTE — Patient Instructions (Signed)
Medication Instructions:  Your physician recommends that you continue on your current medications as directed. Please refer to the Current Medication list given to you today.  *If you need a refill on your cardiac medications before your next appointment, please call your pharmacy*  Follow-Up: At [facility], you and your health needs are our priority.  As part of our continuing mission to provide you with exceptional heart care, we have created designated Provider Care Teams.  These Care Teams include your primary Cardiologist (physician) and Advanced Practice Providers (APPs -  Physician Assistants and Nurse Practitioners) who all work together to provide you with the care you need, when you need it.  We recommend signing up for the patient portal called "MyChart".  Sign up information is provided on this After Visit Summary.  MyChart is used to connect with patients for Virtual Visits (Telemedicine).  Patients are able to view lab/test results, encounter notes, upcoming appointments, etc.  Non-urgent messages can be sent to your provider as well.   To learn more about what you can do with MyChart, go to [email or web address].    Your next appointment:   12 month(s)  The format for your next appointment:   In Person  Provider:   James Hochrein, MD      

## 2021-10-22 DIAGNOSIS — N186 End stage renal disease: Secondary | ICD-10-CM | POA: Diagnosis not present

## 2021-10-22 DIAGNOSIS — D631 Anemia in chronic kidney disease: Secondary | ICD-10-CM | POA: Diagnosis not present

## 2021-10-22 DIAGNOSIS — D509 Iron deficiency anemia, unspecified: Secondary | ICD-10-CM | POA: Diagnosis not present

## 2021-10-22 DIAGNOSIS — N2581 Secondary hyperparathyroidism of renal origin: Secondary | ICD-10-CM | POA: Diagnosis not present

## 2021-10-24 DIAGNOSIS — D631 Anemia in chronic kidney disease: Secondary | ICD-10-CM | POA: Diagnosis not present

## 2021-10-24 DIAGNOSIS — N186 End stage renal disease: Secondary | ICD-10-CM | POA: Diagnosis not present

## 2021-10-24 DIAGNOSIS — D509 Iron deficiency anemia, unspecified: Secondary | ICD-10-CM | POA: Diagnosis not present

## 2021-10-24 DIAGNOSIS — N2581 Secondary hyperparathyroidism of renal origin: Secondary | ICD-10-CM | POA: Diagnosis not present

## 2021-10-27 DIAGNOSIS — Z7901 Long term (current) use of anticoagulants: Secondary | ICD-10-CM | POA: Diagnosis not present

## 2021-10-27 DIAGNOSIS — D631 Anemia in chronic kidney disease: Secondary | ICD-10-CM | POA: Diagnosis not present

## 2021-10-27 DIAGNOSIS — D509 Iron deficiency anemia, unspecified: Secondary | ICD-10-CM | POA: Diagnosis not present

## 2021-10-27 DIAGNOSIS — N186 End stage renal disease: Secondary | ICD-10-CM | POA: Diagnosis not present

## 2021-10-27 DIAGNOSIS — N2581 Secondary hyperparathyroidism of renal origin: Secondary | ICD-10-CM | POA: Diagnosis not present

## 2021-10-29 DIAGNOSIS — N186 End stage renal disease: Secondary | ICD-10-CM | POA: Diagnosis not present

## 2021-10-29 DIAGNOSIS — D631 Anemia in chronic kidney disease: Secondary | ICD-10-CM | POA: Diagnosis not present

## 2021-10-29 DIAGNOSIS — N2581 Secondary hyperparathyroidism of renal origin: Secondary | ICD-10-CM | POA: Diagnosis not present

## 2021-10-29 DIAGNOSIS — D509 Iron deficiency anemia, unspecified: Secondary | ICD-10-CM | POA: Diagnosis not present

## 2021-10-31 DIAGNOSIS — D509 Iron deficiency anemia, unspecified: Secondary | ICD-10-CM | POA: Diagnosis not present

## 2021-10-31 DIAGNOSIS — N2581 Secondary hyperparathyroidism of renal origin: Secondary | ICD-10-CM | POA: Diagnosis not present

## 2021-10-31 DIAGNOSIS — D631 Anemia in chronic kidney disease: Secondary | ICD-10-CM | POA: Diagnosis not present

## 2021-10-31 DIAGNOSIS — N186 End stage renal disease: Secondary | ICD-10-CM | POA: Diagnosis not present

## 2021-11-01 DIAGNOSIS — N186 End stage renal disease: Secondary | ICD-10-CM | POA: Diagnosis not present

## 2021-11-01 DIAGNOSIS — Z992 Dependence on renal dialysis: Secondary | ICD-10-CM | POA: Diagnosis not present

## 2021-11-03 DIAGNOSIS — D631 Anemia in chronic kidney disease: Secondary | ICD-10-CM | POA: Diagnosis not present

## 2021-11-03 DIAGNOSIS — N186 End stage renal disease: Secondary | ICD-10-CM | POA: Diagnosis not present

## 2021-11-03 DIAGNOSIS — D509 Iron deficiency anemia, unspecified: Secondary | ICD-10-CM | POA: Diagnosis not present

## 2021-11-03 DIAGNOSIS — N2581 Secondary hyperparathyroidism of renal origin: Secondary | ICD-10-CM | POA: Diagnosis not present

## 2021-11-03 DIAGNOSIS — Z7901 Long term (current) use of anticoagulants: Secondary | ICD-10-CM | POA: Diagnosis not present

## 2021-11-06 DIAGNOSIS — D631 Anemia in chronic kidney disease: Secondary | ICD-10-CM | POA: Diagnosis not present

## 2021-11-06 DIAGNOSIS — D509 Iron deficiency anemia, unspecified: Secondary | ICD-10-CM | POA: Diagnosis not present

## 2021-11-06 DIAGNOSIS — N2581 Secondary hyperparathyroidism of renal origin: Secondary | ICD-10-CM | POA: Diagnosis not present

## 2021-11-06 DIAGNOSIS — N186 End stage renal disease: Secondary | ICD-10-CM | POA: Diagnosis not present

## 2021-11-07 DIAGNOSIS — N2581 Secondary hyperparathyroidism of renal origin: Secondary | ICD-10-CM | POA: Diagnosis not present

## 2021-11-07 DIAGNOSIS — N186 End stage renal disease: Secondary | ICD-10-CM | POA: Diagnosis not present

## 2021-11-07 DIAGNOSIS — Z7901 Long term (current) use of anticoagulants: Secondary | ICD-10-CM | POA: Diagnosis not present

## 2021-11-07 DIAGNOSIS — D509 Iron deficiency anemia, unspecified: Secondary | ICD-10-CM | POA: Diagnosis not present

## 2021-11-07 DIAGNOSIS — D631 Anemia in chronic kidney disease: Secondary | ICD-10-CM | POA: Diagnosis not present

## 2021-11-10 DIAGNOSIS — N186 End stage renal disease: Secondary | ICD-10-CM | POA: Diagnosis not present

## 2021-11-10 DIAGNOSIS — Z7901 Long term (current) use of anticoagulants: Secondary | ICD-10-CM | POA: Diagnosis not present

## 2021-11-10 DIAGNOSIS — D631 Anemia in chronic kidney disease: Secondary | ICD-10-CM | POA: Diagnosis not present

## 2021-11-10 DIAGNOSIS — N2581 Secondary hyperparathyroidism of renal origin: Secondary | ICD-10-CM | POA: Diagnosis not present

## 2021-11-10 DIAGNOSIS — D509 Iron deficiency anemia, unspecified: Secondary | ICD-10-CM | POA: Diagnosis not present

## 2021-11-12 DIAGNOSIS — D509 Iron deficiency anemia, unspecified: Secondary | ICD-10-CM | POA: Diagnosis not present

## 2021-11-12 DIAGNOSIS — N186 End stage renal disease: Secondary | ICD-10-CM | POA: Diagnosis not present

## 2021-11-12 DIAGNOSIS — D631 Anemia in chronic kidney disease: Secondary | ICD-10-CM | POA: Diagnosis not present

## 2021-11-12 DIAGNOSIS — N2581 Secondary hyperparathyroidism of renal origin: Secondary | ICD-10-CM | POA: Diagnosis not present

## 2021-11-14 DIAGNOSIS — D509 Iron deficiency anemia, unspecified: Secondary | ICD-10-CM | POA: Diagnosis not present

## 2021-11-14 DIAGNOSIS — N186 End stage renal disease: Secondary | ICD-10-CM | POA: Diagnosis not present

## 2021-11-14 DIAGNOSIS — D631 Anemia in chronic kidney disease: Secondary | ICD-10-CM | POA: Diagnosis not present

## 2021-11-14 DIAGNOSIS — N2581 Secondary hyperparathyroidism of renal origin: Secondary | ICD-10-CM | POA: Diagnosis not present

## 2021-11-17 DIAGNOSIS — Z7901 Long term (current) use of anticoagulants: Secondary | ICD-10-CM | POA: Diagnosis not present

## 2021-11-17 DIAGNOSIS — N186 End stage renal disease: Secondary | ICD-10-CM | POA: Diagnosis not present

## 2021-11-17 DIAGNOSIS — D631 Anemia in chronic kidney disease: Secondary | ICD-10-CM | POA: Diagnosis not present

## 2021-11-17 DIAGNOSIS — N2581 Secondary hyperparathyroidism of renal origin: Secondary | ICD-10-CM | POA: Diagnosis not present

## 2021-11-17 DIAGNOSIS — D509 Iron deficiency anemia, unspecified: Secondary | ICD-10-CM | POA: Diagnosis not present

## 2021-11-19 DIAGNOSIS — N2581 Secondary hyperparathyroidism of renal origin: Secondary | ICD-10-CM | POA: Diagnosis not present

## 2021-11-19 DIAGNOSIS — N186 End stage renal disease: Secondary | ICD-10-CM | POA: Diagnosis not present

## 2021-11-19 DIAGNOSIS — D631 Anemia in chronic kidney disease: Secondary | ICD-10-CM | POA: Diagnosis not present

## 2021-11-19 DIAGNOSIS — D509 Iron deficiency anemia, unspecified: Secondary | ICD-10-CM | POA: Diagnosis not present

## 2021-11-21 DIAGNOSIS — D631 Anemia in chronic kidney disease: Secondary | ICD-10-CM | POA: Diagnosis not present

## 2021-11-21 DIAGNOSIS — N186 End stage renal disease: Secondary | ICD-10-CM | POA: Diagnosis not present

## 2021-11-21 DIAGNOSIS — D509 Iron deficiency anemia, unspecified: Secondary | ICD-10-CM | POA: Diagnosis not present

## 2021-11-21 DIAGNOSIS — N2581 Secondary hyperparathyroidism of renal origin: Secondary | ICD-10-CM | POA: Diagnosis not present

## 2021-11-24 DIAGNOSIS — N186 End stage renal disease: Secondary | ICD-10-CM | POA: Diagnosis not present

## 2021-11-24 DIAGNOSIS — Z7901 Long term (current) use of anticoagulants: Secondary | ICD-10-CM | POA: Diagnosis not present

## 2021-11-24 DIAGNOSIS — N2581 Secondary hyperparathyroidism of renal origin: Secondary | ICD-10-CM | POA: Diagnosis not present

## 2021-11-24 DIAGNOSIS — D631 Anemia in chronic kidney disease: Secondary | ICD-10-CM | POA: Diagnosis not present

## 2021-11-24 DIAGNOSIS — D509 Iron deficiency anemia, unspecified: Secondary | ICD-10-CM | POA: Diagnosis not present

## 2021-11-26 DIAGNOSIS — N186 End stage renal disease: Secondary | ICD-10-CM | POA: Diagnosis not present

## 2021-11-26 DIAGNOSIS — D631 Anemia in chronic kidney disease: Secondary | ICD-10-CM | POA: Diagnosis not present

## 2021-11-26 DIAGNOSIS — D509 Iron deficiency anemia, unspecified: Secondary | ICD-10-CM | POA: Diagnosis not present

## 2021-11-26 DIAGNOSIS — N2581 Secondary hyperparathyroidism of renal origin: Secondary | ICD-10-CM | POA: Diagnosis not present

## 2021-11-28 DIAGNOSIS — N2581 Secondary hyperparathyroidism of renal origin: Secondary | ICD-10-CM | POA: Diagnosis not present

## 2021-11-28 DIAGNOSIS — N186 End stage renal disease: Secondary | ICD-10-CM | POA: Diagnosis not present

## 2021-11-28 DIAGNOSIS — D631 Anemia in chronic kidney disease: Secondary | ICD-10-CM | POA: Diagnosis not present

## 2021-11-28 DIAGNOSIS — D509 Iron deficiency anemia, unspecified: Secondary | ICD-10-CM | POA: Diagnosis not present

## 2021-12-01 DIAGNOSIS — N2581 Secondary hyperparathyroidism of renal origin: Secondary | ICD-10-CM | POA: Diagnosis not present

## 2021-12-01 DIAGNOSIS — D631 Anemia in chronic kidney disease: Secondary | ICD-10-CM | POA: Diagnosis not present

## 2021-12-01 DIAGNOSIS — Z7901 Long term (current) use of anticoagulants: Secondary | ICD-10-CM | POA: Diagnosis not present

## 2021-12-01 DIAGNOSIS — D509 Iron deficiency anemia, unspecified: Secondary | ICD-10-CM | POA: Diagnosis not present

## 2021-12-01 DIAGNOSIS — N186 End stage renal disease: Secondary | ICD-10-CM | POA: Diagnosis not present

## 2021-12-02 DIAGNOSIS — Z992 Dependence on renal dialysis: Secondary | ICD-10-CM | POA: Diagnosis not present

## 2021-12-02 DIAGNOSIS — N186 End stage renal disease: Secondary | ICD-10-CM | POA: Diagnosis not present

## 2021-12-03 DIAGNOSIS — N186 End stage renal disease: Secondary | ICD-10-CM | POA: Diagnosis not present

## 2021-12-03 DIAGNOSIS — D631 Anemia in chronic kidney disease: Secondary | ICD-10-CM | POA: Diagnosis not present

## 2021-12-03 DIAGNOSIS — N2581 Secondary hyperparathyroidism of renal origin: Secondary | ICD-10-CM | POA: Diagnosis not present

## 2021-12-03 DIAGNOSIS — D509 Iron deficiency anemia, unspecified: Secondary | ICD-10-CM | POA: Diagnosis not present

## 2021-12-03 NOTE — Assessment & Plan Note (Signed)
Rate controlled and tolerating meds RSV (respiratory syncitial virus) vaccine at pharmacy, Arexvy Covid booster at pharmacy High dose flu shot  Shingrix is the new shingles shot, 2 shots over 2-6 months, confirm coverage with insurance and document, then can return here for shots with nurse appt or at pharmacy

## 2021-12-03 NOTE — Assessment & Plan Note (Signed)
Well controlled, no changes to meds. Encouraged heart healthy diet such as the DASH diet and exercise as tolerated.  °

## 2021-12-03 NOTE — Assessment & Plan Note (Signed)
Hydrate and monitor following with nephrology

## 2021-12-03 NOTE — Assessment & Plan Note (Signed)
Encourage heart healthy diet such as MIND or DASH diet, increase exercise, avoid trans fats, simple carbohydrates and processed foods, consider a krill or fish or flaxseed oil cap daily.  °

## 2021-12-04 ENCOUNTER — Ambulatory Visit (INDEPENDENT_AMBULATORY_CARE_PROVIDER_SITE_OTHER): Payer: Medicare HMO | Admitting: Family Medicine

## 2021-12-04 VITALS — BP 130/68 | HR 59 | Temp 98.0°F | Resp 16 | Ht 66.0 in | Wt 132.6 lb

## 2021-12-04 DIAGNOSIS — N186 End stage renal disease: Secondary | ICD-10-CM | POA: Diagnosis not present

## 2021-12-04 DIAGNOSIS — E782 Mixed hyperlipidemia: Secondary | ICD-10-CM | POA: Diagnosis not present

## 2021-12-04 DIAGNOSIS — G4700 Insomnia, unspecified: Secondary | ICD-10-CM | POA: Diagnosis not present

## 2021-12-04 DIAGNOSIS — I10 Essential (primary) hypertension: Secondary | ICD-10-CM | POA: Diagnosis not present

## 2021-12-04 DIAGNOSIS — F32A Depression, unspecified: Secondary | ICD-10-CM

## 2021-12-04 DIAGNOSIS — R7989 Other specified abnormal findings of blood chemistry: Secondary | ICD-10-CM | POA: Diagnosis not present

## 2021-12-04 DIAGNOSIS — R69 Illness, unspecified: Secondary | ICD-10-CM | POA: Diagnosis not present

## 2021-12-04 DIAGNOSIS — I480 Paroxysmal atrial fibrillation: Secondary | ICD-10-CM

## 2021-12-04 MED ORDER — LORAZEPAM 0.5 MG PO TABS: 0.5000 mg | ORAL_TABLET | Freq: Two times a day (BID) | ORAL | 1 refills | Status: DC | PRN

## 2021-12-04 MED ORDER — FLUOXETINE HCL 10 MG PO TABS: 10.0000 mg | ORAL_TABLET | Freq: Every day | ORAL | 3 refills | Status: DC

## 2021-12-04 NOTE — Progress Notes (Signed)
Subjective:   By signing my name below, I, Jillian Hayes, attest that this documentation has been prepared under the direction and in the presence of Jillian Lukes, MD., 12/04/2021.    Patient ID: Jillian Hayes, female    DOB: 09-Jul-1945, 76 y.o.   MRN: [id number]  Chief Complaint  Patient presents with   office Visit    Medicare Annual Visit follow up   HPI Patient is in today for a comprehensive physical exam and follow up on chronic medical concerns.   She denies having any fever, chills, ear pain, headaches, muscle pain, joint pain, new moles, rash, itching, congestion, sinus pain, sore throat, chest pain, palpitations, wheezing, nausea, vomitting, abdominal pain, diarrhea, constipation, blood in stool, dysuria, urgency, frequency and hematuria.  Bilateral Shoulder Pain: She states that she is seeing a chiropractor to manage her bilateral shoulder pain. She further states that the pain is controlled during the day, but worsens at night.  Family history: She reports no changes to her family history.   Immunizations: She has been informed about receiving COVID-19, high-dose Flu, RSV, and Shingles immunizations.  Insomnia: She complains that she is only sleeping 2-3 hours nightly. She states that her mind is racing at night and she is experiencing bilateral shoulder pain which hinders her sleep. She further states that Trazodone 50 mg was ineffective at helping her sleep but she is interested in taking Fluoxetine and Lorazepam.  Nephrology: She reports that her phosphorus levels are elevated and this is preventing her from receiving a kidney transplant. She has been undergoing dialysis for 3 years.  Past Medical History:  Diagnosis Date   Advanced care planning/counseling discussion 06/10/2014   05/31/2014 patient presents copy of HCP and Living Will   Anemia    iron deficiency   Anxiety    Arthritis    "in hands"   Atrial fibrillation ([place])    Benign fundic gland polyps of  stomach    Bladder polyps 06/25/2010   Chronic headaches 14/97/0263   Complication of anesthesia    "woke up at the end of a cyst removal surgery in 1991"   Depression 1991   hospitalized   Diverticulosis    ESRD (end stage renal disease) ([place]) 11/09/2015   HD on MWF   External prolapsed hemorrhoids    History of chicken pox 06/25/2010   Hypercalcemia 02/18/2014   Hypertension    Insomnia 06/24/2010   Multiple chemical sensitivity syndrome 06/25/2010   Proteinuria 02/18/2014   Renal insufficiency 03/26/2011   Valvular heart disease 04/28/2016   Past Surgical History:  Procedure Laterality Date   AV FISTULA PLACEMENT Left    x4   COLONOSCOPY  2014   cyst on left breast removed Left 1991   benign   ESOPHAGOGASTRODUODENOSCOPY (EGD) WITH ESOPHAGEAL DILATION  2014   LAPAROSCOPIC BILATERAL SALPINGO OOPHERECTOMY Bilateral 08/20/2020   Procedure: LAPAROSCOPIC BILATERAL SALPINGO OOPHORECTOMY;  Surgeon: Megan Salon, MD;  Location: [facility];  Service: Gynecology;  Laterality: Bilateral;   NASAL SEPTUM SURGERY  1986   rhinoplasty   polyps on bladder removed  1972   benign   RIGHT HEART CATH N/A 04/10/2021   Procedure: RIGHT HEART CATH;  Surgeon: Larey Dresser, MD;  Location: [facility];  Service: Cardiovascular;  Laterality: N/A;   TONSILLECTOMY  1962   Family History  Problem Relation Age of Onset   Breast cancer Mother 42       left breast removed, 2010 lung   Diabetes  Mother    Nephrolithiasis Father    Heart attack Father 45       X 3   Heart disease Father        smoker   Hypertension Brother    Allergic rhinitis Brother    Melanoma Son 42       melanoma on leg removed   Pancreatic cancer Maternal Grandmother    Hypertension Paternal Grandmother    Obesity Paternal Grandmother    Heart attack Paternal Grandfather 34   Diabetes Maternal Aunt        x 2   Diabetes Maternal Uncle        x 3   Asthma Neg Hx    Eczema Neg Hx    Urticaria Neg Hx     Immunodeficiency Neg Hx    Angioedema Neg Hx    Social History   Socioeconomic History   Marital status: Married    Spouse name: Not on file   Number of children: 2   Years of education: Not on file   Highest education level: Not on file  Occupational History   Occupation: retired  Tobacco Use   Smoking status: Never   Smokeless tobacco: Never  Vaping Use   Vaping Use: Never used  Substance and Sexual Activity   Alcohol use: No   Drug use: No   Sexual activity: Yes    Partners: Male  Other Topics Concern   Not on file  Social History Narrative   Married and lives with husband.     Retired   1 son and 1 daughter   No alcohol tobacco or caffeine   Social Determinants of Health   Financial Resource Strain: Low Risk  (01/30/2021)   Overall Financial Resource Strain (CARDIA)    Difficulty of Paying Living Expenses: Not hard at all  Food Insecurity: No Food Insecurity (01/30/2021)   Hunger Vital Sign    Worried About Running Out of Food in the Last Year: Never true    Ran Out of Food in the Last Year: Never true  Transportation Needs: No Transportation Needs (01/30/2021)   PRAPARE - Hydrologist (Medical): No    Lack of Transportation (Non-Medical): No  Physical Activity: Insufficiently Active (01/30/2021)   Exercise Vital Sign    Days of Exercise per Week: 7 days    Minutes of Exercise per Session: 20 min  Stress: No Stress Concern Present (01/30/2021)   [place]    Feeling of Stress : Not at all  Social Connections: Moderately Integrated (01/30/2021)   Social Connection and Isolation Panel [NHANES]    Frequency of Communication with Friends and Family: More than three times a week    Frequency of Social Gatherings with Friends and Family: More than three times a week    Attends Religious Services: More than 4 times per year    Active Member of [employer] or Organizations: No     Attends Archivist Meetings: Never    Marital Status: Married  Human resources officer Violence: Not At Risk (01/30/2021)   Humiliation, Afraid, Rape, and Kick questionnaire    Fear of Current or Ex-Partner: No    Emotionally Abused: No    Physically Abused: No    Sexually Abused: No   Outpatient Medications Prior to Visit  Medication Sig Dispense Refill   acetaminophen (TYLENOL) 500 MG tablet Take 500 mg by mouth every 6 (six) hours as needed  for headache (pain).     Alfalfa 500 MG TABS Take 500 mg by mouth 3 (three) times daily.     atorvastatin (LIPITOR) 20 MG tablet Take 20 mg by mouth at bedtime.     carvedilol (COREG) 25 MG tablet Take 1 tablet by mouth twice daily 180 tablet 2   cinacalcet (SENSIPAR) 30 MG tablet Take 30 mg by mouth at bedtime.     diltiazem (CARDIZEM CD) 240 MG 24 hr capsule Take 240 mg by mouth daily with lunch.     Flaxseed, Linseed, (FLAX SEED OIL) 1000 MG CAPS Take 1,000 mg by mouth every evening.     hydrALAZINE (APRESOLINE) 50 MG tablet Take 75 mg by mouth 2 (two) times daily.     L-Glutamine 500 MG CAPS Take 500 mg by mouth every evening.     lidocaine-prilocaine (EMLA) cream Apply 1 application. topically every Monday, Wednesday, and Friday with hemodialysis.     OVER THE COUNTER MEDICATION Take 3 capsules by mouth every evening. Shaklee Herb-Lax Natural Laxative for Colon Cleanse (Senna, Licorice, and Alfalfa)     Probiotic Product (PROBIOTIC DAILY PO) Take 1 capsule by mouth in the morning.     sevelamer carbonate (RENVELA) 800 MG tablet Take 2,400 mg by mouth See admin instructions. Take 3 tablets (2400 mg) by mouth with each meal & take 2 tablets (1600 mg) by mouth with each large snack     vitamin C (ASCORBIC ACID) 500 MG tablet Take 500 mg by mouth daily.     Vitamin E 400 units TABS Take 400 Units by mouth daily with lunch.     warfarin (COUMADIN) 1 MG tablet Take 1 mg by mouth every Tuesday, Thursday, and Saturday at 6 PM.     warfarin  (COUMADIN) 5 MG tablet Take 1 tablet (5 mg total) by mouth daily at 4 PM. ONCE DAILY AS DIRECTED (Patient taking differently: Take 5 mg by mouth every evening.) 30 tablet 0   traZODone (DESYREL) 50 MG tablet Take 0.5-1 tablets (25-50 mg total) by mouth at bedtime as needed for sleep. 30 tablet 1   Facility-Administered Medications Prior to Visit  Medication Dose Route Frequency Provider Last Rate Last Admin   triamcinolone acetonide (KENALOG-40) injection 40 mg  40 mg Intra-articular Once Rosemarie Ax, MD       Allergies  Allergen Reactions   Sulfa Antibiotics Nausea And Vomiting   Clonidine Derivatives Palpitations   Review of Systems  Constitutional:  Negative for chills and fever.  HENT:  Negative for congestion, ear pain, sinus pain and sore throat.   Respiratory:  Negative for cough, shortness of breath and wheezing.   Cardiovascular:  Negative for chest pain and palpitations.  Gastrointestinal:  Negative for abdominal pain, blood in stool, constipation, diarrhea, nausea and vomiting.  Genitourinary:  Negative for dysuria, frequency, hematuria and urgency.  Musculoskeletal:  Negative for joint pain and myalgias.  Skin:  Negative for itching and rash.       (-) New moles.  Neurological:  Negative for headaches.      Objective:    Physical Exam Constitutional:      General: She is not in acute distress.    Appearance: Normal appearance. She is not ill-appearing.  HENT:     Head: Normocephalic and atraumatic.     Right Ear: Tympanic membrane, ear canal and external ear normal.     Left Ear: Tympanic membrane, ear canal and external ear normal.     Mouth/Throat:  Mouth: Mucous membranes are moist.     Pharynx: Oropharynx is clear.  Eyes:     Extraocular Movements: Extraocular movements intact.     Right eye: No nystagmus.     Left eye: No nystagmus.     Pupils: Pupils are equal, round, and reactive to light.  Neck:     Vascular: No carotid bruit.  Cardiovascular:      Rate and Rhythm: Normal rate and regular rhythm.     Pulses: Normal pulses.     Heart sounds: Murmur heard.     Systolic murmur is present with a grade of 2/6.     No gallop.  Pulmonary:     Effort: Pulmonary effort is normal. No respiratory distress.     Breath sounds: Normal breath sounds. No wheezing or rales.  Abdominal:     General: Bowel sounds are normal.     Tenderness: There is no abdominal tenderness.  Musculoskeletal:     Comments: Muscle strength 5/5 on upper and lower extremities.   Lymphadenopathy:     Cervical: No cervical adenopathy.  Skin:    General: Skin is warm and dry.  Neurological:     Mental Status: She is alert and oriented to person, place, and time.     Sensory: Sensation is intact.     Motor: Motor function is intact.     Coordination: Coordination is intact.     Deep Tendon Reflexes:     Reflex Scores:      Patellar reflexes are 2+ on the right side and 2+ on the left side. Psychiatric:        Mood and Affect: Mood normal.        Behavior: Behavior normal.        Judgment: Judgment normal.    BP 130/68 (BP Location: Right Arm, Patient Position: Sitting, Cuff Size: Normal)   Pulse (!) 59   Temp 98 F (36.7 C) (Oral)   Resp 16   Ht _0  (1.676 m)   Wt 132 lb 9.6 oz (60.1 kg)   SpO2 95%   BMI 21.40 kg/m  Wt Readings from Last 3 Encounters:  12/04/21 132 lb 9.6 oz (60.1 kg)  10/21/21 135 lb 3.2 oz (61.3 kg)  10/07/21 129 lb 9.6 oz (58.8 kg)   Diabetic Foot Exam - Simple   No data filed    Lab Results  Component Value Date   WBC 4.2 04/01/2021   HGB 11.6 (L) 04/10/2021   HGB 11.9 (L) 04/10/2021   HCT 34.0 (L) 04/10/2021   HCT 35.0 (L) 04/10/2021   PLT 212 04/01/2021   GLUCOSE 76 04/01/2021   CHOL 124 06/19/2021   TRIG 76.0 06/19/2021   HDL 89.10 06/19/2021   LDLCALC 19 06/19/2021   ALT 20 08/15/2020   AST 31 08/15/2020   NA 138 04/10/2021   NA 139 04/10/2021   K 4.3 04/10/2021   K 4.3 04/10/2021   CL 104 04/01/2021    CREATININE 5.16 (H) 04/01/2021   BUN 35 (H) 04/01/2021   CO2 25 04/01/2021   TSH 0.23 (L) 06/19/2021   INR 1.1 04/10/2021   HGBA1C 5.7 (H) 10/25/2017   Lab Results  Component Value Date   TSH 0.23 (L) 06/19/2021   Lab Results  Component Value Date   WBC 4.2 04/01/2021   HGB 11.6 (L) 04/10/2021   HGB 11.9 (L) 04/10/2021   HCT 34.0 (L) 04/10/2021   HCT 35.0 (L) 04/10/2021   MCV 97 04/01/2021  PLT 212 04/01/2021   Lab Results  Component Value Date   NA 138 04/10/2021   NA 139 04/10/2021   K 4.3 04/10/2021   K 4.3 04/10/2021   CHLORIDE 109 03/25/2016   CO2 25 04/01/2021   GLUCOSE 76 04/01/2021   BUN 35 (H) 04/01/2021   CREATININE 5.16 (H) 04/01/2021   BILITOT 0.7 08/15/2020   ALKPHOS 80 08/15/2020   AST 31 08/15/2020   ALT 20 08/15/2020   PROT 6.2 (L) 08/15/2020   ALBUMIN 3.6 08/15/2020   CALCIUM 9.7 04/01/2021   ANIONGAP 11 08/15/2020   EGFR 8 (L) 04/01/2021   GFR 11.58 (LL) 09/20/2018   Lab Results  Component Value Date   CHOL 124 06/19/2021   Lab Results  Component Value Date   HDL 89.10 06/19/2021   Lab Results  Component Value Date   LDLCALC 19 06/19/2021   Lab Results  Component Value Date   TRIG 76.0 06/19/2021   Lab Results  Component Value Date   CHOLHDL 1 06/19/2021   Lab Results  Component Value Date   HGBA1C 5.7 (H) 10/25/2017   Colonoscopy: Last completed on 08/04/2012. -Internal hemorrhoids in the rectum . -Moderate diverticulosis was noted in the sigmoid colon. -The colon mucosa was otherwise normal.  Dexa: Last completed on 03/19/2020. Patient has Osteoporosis. Repeat in 2-5 years.   Mammogram: Last completed on 02/13/2021. No mammographic evidence of malignancy. Repeat in 1-2 years.  Pap Smear: Last completed on 08/20/2020. Normal results.    Assessment & Plan:   Problem List Items Addressed This Visit     Depression    She is struggling with anhedonia and anxiety regarding her current health concerns. She agrees to  start Fluoxetine 10 mg po daily and she can use Lorazepam prn sparingly.      Relevant Medications   FLUoxetine (PROZAC) 10 MG tablet   LORazepam (ATIVAN) 0.5 MG tablet   Insomnia - Primary   HTN (hypertension)    Well controlled, no changes to meds. Encouraged heart healthy diet such as the DASH diet and exercise as tolerated.       Relevant Orders   CBC   Comprehensive metabolic panel   TSH   ESRD (end stage renal disease) ([place])    Hydrate and monitor following with nephrology they are working to get her phosphorus down so she can be considered for renal transplant      Hyperlipidemia    Encourage heart healthy diet such as MIND or DASH diet, increase exercise, avoid trans fats, simple carbohydrates and processed foods, consider a krill or fish or flaxseed oil cap daily.       Relevant Orders   Lipid panel   Atrial fibrillation ([place])    Rate controlled and tolerating meds RSV (respiratory syncitial virus) vaccine at pharmacy, Arexvy Covid booster at pharmacy High dose flu shot  Shingrix is the new shingles shot, 2 shots over 2-6 months, confirm coverage with insurance and document, then can return here for shots with nurse appt or at pharmacy      Other Visit Diagnoses     High serum phosphorus for age       Relevant Orders   Magnesium      Meds ordered this encounter  Medications   FLUoxetine (PROZAC) 10 MG tablet    Sig: Take 1 tablet (10 mg total) by mouth daily.    Dispense:  30 tablet    Refill:  3   LORazepam (ATIVAN) 0.5 MG tablet  Sig: Take 1 tablet (0.5 mg total) by mouth 2 (two) times daily as needed for anxiety.    Dispense:  30 tablet    Refill:  1   I, [name], MD, personally preformed the services described in this documentation.  All medical record entries made by the scribe were at my direction and in my presence.  I have reviewed the chart and discharge instructions (if applicable) and agree that the record reflects my personal  performance and is accurate and complete. 12/04/2021  I,Mohammed Iqbal,acting as a scribe for [name], MD.,have documented all relevant documentation on the behalf of [name], MD,as directed by  [name], MD while in the presence of [name], MD.  [name], MD

## 2021-12-04 NOTE — Patient Instructions (Addendum)
RSV (respiratory syncitial virus) vaccine at pharmacy, Arexvy  Covid booster at pharmacy High dose flu shot at center  Shingrix is the new shingles shot, 2 shots over 2-6 months, confirm coverage with insurance and document, then can return here for shots with nurse appt or at pharmacy

## 2021-12-04 NOTE — Assessment & Plan Note (Signed)
She is struggling with anhedonia and anxiety regarding her current health concerns. She agrees to start Fluoxetine 10 mg po daily and she can use Lorazepam prn sparingly.

## 2021-12-05 ENCOUNTER — Telehealth: Payer: Self-pay

## 2021-12-05 DIAGNOSIS — N2581 Secondary hyperparathyroidism of renal origin: Secondary | ICD-10-CM | POA: Diagnosis not present

## 2021-12-05 DIAGNOSIS — D631 Anemia in chronic kidney disease: Secondary | ICD-10-CM | POA: Diagnosis not present

## 2021-12-05 DIAGNOSIS — N186 End stage renal disease: Secondary | ICD-10-CM | POA: Diagnosis not present

## 2021-12-05 DIAGNOSIS — D509 Iron deficiency anemia, unspecified: Secondary | ICD-10-CM | POA: Diagnosis not present

## 2021-12-05 LAB — COMPREHENSIVE METABOLIC PANEL
ALT: 20 U/L (ref 0–35)
AST: 26 U/L (ref 0–37)
Albumin: 4.2 g/dL (ref 3.5–5.2)
Alkaline Phosphatase: 75 U/L (ref 39–117)
BUN: 43 mg/dL — ABNORMAL HIGH (ref 6–23)
CO2: 28 mEq/L (ref 19–32)
Calcium: 9.5 mg/dL (ref 8.4–10.5)
Chloride: 98 mEq/L (ref 96–112)
Creatinine, Ser: 5.34 mg/dL (ref 0.40–1.20)
GFR: 7.35 mL/min — CL (ref >60.00)
Glucose, Bld: 90 mg/dL (ref 70–99)
Potassium: 4.2 mEq/L (ref 3.5–5.1)
Sodium: 138 mEq/L (ref 135–145)
Total Bilirubin: 0.4 mg/dL (ref 0.2–1.2)
Total Protein: 6.2 g/dL (ref 6.0–8.3)

## 2021-12-05 LAB — LIPID PANEL
Cholesterol: 157 mg/dL (ref 0–200)
HDL: 100.7 mg/dL (ref >39.00)
LDL Cholesterol: 45 mg/dL (ref 0–99)
NonHDL: 56.34
Total CHOL/HDL Ratio: 2
Triglycerides: 59 mg/dL (ref 0.0–149.0)
VLDL: 11.8 mg/dL (ref 0.0–40.0)

## 2021-12-05 LAB — CBC
HCT: 35.9 % — ABNORMAL LOW (ref 36.0–46.0)
Hemoglobin: 11.5 g/dL — ABNORMAL LOW (ref 12.0–15.0)
MCHC: 32.1 g/dL (ref 30.0–36.0)
MCV: 97.9 fl (ref 78.0–100.0)
Platelets: 219 10*3/uL (ref 150.0–400.0)
RBC: 3.66 Mil/uL — ABNORMAL LOW (ref 3.87–5.11)
RDW: 16.5 % — ABNORMAL HIGH (ref 11.5–15.5)
WBC: 5.1 10*3/uL (ref 4.0–10.5)

## 2021-12-05 LAB — TSH: TSH: 0.07 u[IU]/mL — ABNORMAL LOW (ref 0.35–5.50)

## 2021-12-05 LAB — MAGNESIUM: Magnesium: 2.1 mg/dL (ref 1.5–2.5)

## 2021-12-05 NOTE — Telephone Encounter (Signed)
Chart reviewed, this is patient's baseline

## 2021-12-05 NOTE — Telephone Encounter (Signed)
I have received a phone call from [facility] at [place] lab with a couple of critical lab values for the pt.    GFR: 7.35 Creatine: 5.34

## 2021-12-08 DIAGNOSIS — N186 End stage renal disease: Secondary | ICD-10-CM | POA: Diagnosis not present

## 2021-12-08 DIAGNOSIS — D631 Anemia in chronic kidney disease: Secondary | ICD-10-CM | POA: Diagnosis not present

## 2021-12-08 DIAGNOSIS — D509 Iron deficiency anemia, unspecified: Secondary | ICD-10-CM | POA: Diagnosis not present

## 2021-12-08 DIAGNOSIS — N2581 Secondary hyperparathyroidism of renal origin: Secondary | ICD-10-CM | POA: Diagnosis not present

## 2021-12-10 DIAGNOSIS — N2581 Secondary hyperparathyroidism of renal origin: Secondary | ICD-10-CM | POA: Diagnosis not present

## 2021-12-10 DIAGNOSIS — N186 End stage renal disease: Secondary | ICD-10-CM | POA: Diagnosis not present

## 2021-12-10 DIAGNOSIS — D631 Anemia in chronic kidney disease: Secondary | ICD-10-CM | POA: Diagnosis not present

## 2021-12-10 DIAGNOSIS — D509 Iron deficiency anemia, unspecified: Secondary | ICD-10-CM | POA: Diagnosis not present

## 2021-12-12 DIAGNOSIS — Z7901 Long term (current) use of anticoagulants: Secondary | ICD-10-CM | POA: Diagnosis not present

## 2021-12-12 DIAGNOSIS — N186 End stage renal disease: Secondary | ICD-10-CM | POA: Diagnosis not present

## 2021-12-12 DIAGNOSIS — D509 Iron deficiency anemia, unspecified: Secondary | ICD-10-CM | POA: Diagnosis not present

## 2021-12-12 DIAGNOSIS — D631 Anemia in chronic kidney disease: Secondary | ICD-10-CM | POA: Diagnosis not present

## 2021-12-12 DIAGNOSIS — N2581 Secondary hyperparathyroidism of renal origin: Secondary | ICD-10-CM | POA: Diagnosis not present

## 2021-12-15 DIAGNOSIS — N2581 Secondary hyperparathyroidism of renal origin: Secondary | ICD-10-CM | POA: Diagnosis not present

## 2021-12-15 DIAGNOSIS — D509 Iron deficiency anemia, unspecified: Secondary | ICD-10-CM | POA: Diagnosis not present

## 2021-12-15 DIAGNOSIS — D631 Anemia in chronic kidney disease: Secondary | ICD-10-CM | POA: Diagnosis not present

## 2021-12-15 DIAGNOSIS — Z7901 Long term (current) use of anticoagulants: Secondary | ICD-10-CM | POA: Diagnosis not present

## 2021-12-15 DIAGNOSIS — N186 End stage renal disease: Secondary | ICD-10-CM | POA: Diagnosis not present

## 2021-12-17 DIAGNOSIS — D509 Iron deficiency anemia, unspecified: Secondary | ICD-10-CM | POA: Diagnosis not present

## 2021-12-17 DIAGNOSIS — N2581 Secondary hyperparathyroidism of renal origin: Secondary | ICD-10-CM | POA: Diagnosis not present

## 2021-12-17 DIAGNOSIS — D631 Anemia in chronic kidney disease: Secondary | ICD-10-CM | POA: Diagnosis not present

## 2021-12-17 DIAGNOSIS — N186 End stage renal disease: Secondary | ICD-10-CM | POA: Diagnosis not present

## 2021-12-19 DIAGNOSIS — N186 End stage renal disease: Secondary | ICD-10-CM | POA: Diagnosis not present

## 2021-12-19 DIAGNOSIS — D509 Iron deficiency anemia, unspecified: Secondary | ICD-10-CM | POA: Diagnosis not present

## 2021-12-19 DIAGNOSIS — N2581 Secondary hyperparathyroidism of renal origin: Secondary | ICD-10-CM | POA: Diagnosis not present

## 2021-12-19 DIAGNOSIS — D631 Anemia in chronic kidney disease: Secondary | ICD-10-CM | POA: Diagnosis not present

## 2021-12-21 DIAGNOSIS — N186 End stage renal disease: Secondary | ICD-10-CM | POA: Diagnosis not present

## 2021-12-21 DIAGNOSIS — Z7901 Long term (current) use of anticoagulants: Secondary | ICD-10-CM | POA: Diagnosis not present

## 2021-12-21 DIAGNOSIS — N2581 Secondary hyperparathyroidism of renal origin: Secondary | ICD-10-CM | POA: Diagnosis not present

## 2021-12-21 DIAGNOSIS — D631 Anemia in chronic kidney disease: Secondary | ICD-10-CM | POA: Diagnosis not present

## 2021-12-21 DIAGNOSIS — D509 Iron deficiency anemia, unspecified: Secondary | ICD-10-CM | POA: Diagnosis not present

## 2021-12-23 DIAGNOSIS — N2581 Secondary hyperparathyroidism of renal origin: Secondary | ICD-10-CM | POA: Diagnosis not present

## 2021-12-23 DIAGNOSIS — N186 End stage renal disease: Secondary | ICD-10-CM | POA: Diagnosis not present

## 2021-12-23 DIAGNOSIS — D631 Anemia in chronic kidney disease: Secondary | ICD-10-CM | POA: Diagnosis not present

## 2021-12-23 DIAGNOSIS — D509 Iron deficiency anemia, unspecified: Secondary | ICD-10-CM | POA: Diagnosis not present

## 2021-12-26 DIAGNOSIS — D509 Iron deficiency anemia, unspecified: Secondary | ICD-10-CM | POA: Diagnosis not present

## 2021-12-26 DIAGNOSIS — D631 Anemia in chronic kidney disease: Secondary | ICD-10-CM | POA: Diagnosis not present

## 2021-12-26 DIAGNOSIS — N186 End stage renal disease: Secondary | ICD-10-CM | POA: Diagnosis not present

## 2021-12-26 DIAGNOSIS — N2581 Secondary hyperparathyroidism of renal origin: Secondary | ICD-10-CM | POA: Diagnosis not present

## 2021-12-29 DIAGNOSIS — N186 End stage renal disease: Secondary | ICD-10-CM | POA: Diagnosis not present

## 2021-12-29 DIAGNOSIS — D631 Anemia in chronic kidney disease: Secondary | ICD-10-CM | POA: Diagnosis not present

## 2021-12-29 DIAGNOSIS — Z7901 Long term (current) use of anticoagulants: Secondary | ICD-10-CM | POA: Diagnosis not present

## 2021-12-29 DIAGNOSIS — D509 Iron deficiency anemia, unspecified: Secondary | ICD-10-CM | POA: Diagnosis not present

## 2021-12-29 DIAGNOSIS — N2581 Secondary hyperparathyroidism of renal origin: Secondary | ICD-10-CM | POA: Diagnosis not present

## 2021-12-31 DIAGNOSIS — N186 End stage renal disease: Secondary | ICD-10-CM | POA: Diagnosis not present

## 2021-12-31 DIAGNOSIS — N2581 Secondary hyperparathyroidism of renal origin: Secondary | ICD-10-CM | POA: Diagnosis not present

## 2021-12-31 DIAGNOSIS — D509 Iron deficiency anemia, unspecified: Secondary | ICD-10-CM | POA: Diagnosis not present

## 2021-12-31 DIAGNOSIS — D631 Anemia in chronic kidney disease: Secondary | ICD-10-CM | POA: Diagnosis not present

## 2022-01-01 DIAGNOSIS — I120 Hypertensive chronic kidney disease with stage 5 chronic kidney disease or end stage renal disease: Secondary | ICD-10-CM | POA: Diagnosis not present

## 2022-01-01 DIAGNOSIS — I10 Essential (primary) hypertension: Secondary | ICD-10-CM | POA: Diagnosis not present

## 2022-01-01 DIAGNOSIS — N186 End stage renal disease: Secondary | ICD-10-CM | POA: Diagnosis not present

## 2022-01-01 DIAGNOSIS — Z992 Dependence on renal dialysis: Secondary | ICD-10-CM | POA: Diagnosis not present

## 2022-01-01 DIAGNOSIS — I4891 Unspecified atrial fibrillation: Secondary | ICD-10-CM | POA: Diagnosis not present

## 2022-01-01 DIAGNOSIS — E785 Hyperlipidemia, unspecified: Secondary | ICD-10-CM | POA: Diagnosis not present

## 2022-01-01 DIAGNOSIS — Z7901 Long term (current) use of anticoagulants: Secondary | ICD-10-CM | POA: Diagnosis not present

## 2022-01-01 DIAGNOSIS — I6523 Occlusion and stenosis of bilateral carotid arteries: Secondary | ICD-10-CM | POA: Diagnosis not present

## 2022-01-02 DIAGNOSIS — D509 Iron deficiency anemia, unspecified: Secondary | ICD-10-CM | POA: Diagnosis not present

## 2022-01-02 DIAGNOSIS — N2581 Secondary hyperparathyroidism of renal origin: Secondary | ICD-10-CM | POA: Diagnosis not present

## 2022-01-02 DIAGNOSIS — D631 Anemia in chronic kidney disease: Secondary | ICD-10-CM | POA: Diagnosis not present

## 2022-01-02 DIAGNOSIS — N186 End stage renal disease: Secondary | ICD-10-CM | POA: Diagnosis not present

## 2022-01-05 DIAGNOSIS — D631 Anemia in chronic kidney disease: Secondary | ICD-10-CM | POA: Diagnosis not present

## 2022-01-05 DIAGNOSIS — Z7901 Long term (current) use of anticoagulants: Secondary | ICD-10-CM | POA: Diagnosis not present

## 2022-01-05 DIAGNOSIS — D509 Iron deficiency anemia, unspecified: Secondary | ICD-10-CM | POA: Diagnosis not present

## 2022-01-05 DIAGNOSIS — N186 End stage renal disease: Secondary | ICD-10-CM | POA: Diagnosis not present

## 2022-01-05 DIAGNOSIS — N2581 Secondary hyperparathyroidism of renal origin: Secondary | ICD-10-CM | POA: Diagnosis not present

## 2022-01-07 DIAGNOSIS — N186 End stage renal disease: Secondary | ICD-10-CM | POA: Diagnosis not present

## 2022-01-07 DIAGNOSIS — D509 Iron deficiency anemia, unspecified: Secondary | ICD-10-CM | POA: Diagnosis not present

## 2022-01-07 DIAGNOSIS — N2581 Secondary hyperparathyroidism of renal origin: Secondary | ICD-10-CM | POA: Diagnosis not present

## 2022-01-07 DIAGNOSIS — D631 Anemia in chronic kidney disease: Secondary | ICD-10-CM | POA: Diagnosis not present

## 2022-01-09 DIAGNOSIS — N2581 Secondary hyperparathyroidism of renal origin: Secondary | ICD-10-CM | POA: Diagnosis not present

## 2022-01-09 DIAGNOSIS — D631 Anemia in chronic kidney disease: Secondary | ICD-10-CM | POA: Diagnosis not present

## 2022-01-09 DIAGNOSIS — D509 Iron deficiency anemia, unspecified: Secondary | ICD-10-CM | POA: Diagnosis not present

## 2022-01-09 DIAGNOSIS — N186 End stage renal disease: Secondary | ICD-10-CM | POA: Diagnosis not present

## 2022-01-12 DIAGNOSIS — Z7901 Long term (current) use of anticoagulants: Secondary | ICD-10-CM | POA: Diagnosis not present

## 2022-01-12 DIAGNOSIS — D631 Anemia in chronic kidney disease: Secondary | ICD-10-CM | POA: Diagnosis not present

## 2022-01-12 DIAGNOSIS — N186 End stage renal disease: Secondary | ICD-10-CM | POA: Diagnosis not present

## 2022-01-12 DIAGNOSIS — D509 Iron deficiency anemia, unspecified: Secondary | ICD-10-CM | POA: Diagnosis not present

## 2022-01-12 DIAGNOSIS — N2581 Secondary hyperparathyroidism of renal origin: Secondary | ICD-10-CM | POA: Diagnosis not present

## 2022-01-14 DIAGNOSIS — D509 Iron deficiency anemia, unspecified: Secondary | ICD-10-CM | POA: Diagnosis not present

## 2022-01-14 DIAGNOSIS — N2581 Secondary hyperparathyroidism of renal origin: Secondary | ICD-10-CM | POA: Diagnosis not present

## 2022-01-14 DIAGNOSIS — D631 Anemia in chronic kidney disease: Secondary | ICD-10-CM | POA: Diagnosis not present

## 2022-01-14 DIAGNOSIS — N186 End stage renal disease: Secondary | ICD-10-CM | POA: Diagnosis not present

## 2022-01-16 DIAGNOSIS — N2581 Secondary hyperparathyroidism of renal origin: Secondary | ICD-10-CM | POA: Diagnosis not present

## 2022-01-16 DIAGNOSIS — D509 Iron deficiency anemia, unspecified: Secondary | ICD-10-CM | POA: Diagnosis not present

## 2022-01-16 DIAGNOSIS — D631 Anemia in chronic kidney disease: Secondary | ICD-10-CM | POA: Diagnosis not present

## 2022-01-16 DIAGNOSIS — N186 End stage renal disease: Secondary | ICD-10-CM | POA: Diagnosis not present

## 2022-01-19 DIAGNOSIS — Z7901 Long term (current) use of anticoagulants: Secondary | ICD-10-CM | POA: Diagnosis not present

## 2022-01-19 DIAGNOSIS — D631 Anemia in chronic kidney disease: Secondary | ICD-10-CM | POA: Diagnosis not present

## 2022-01-19 DIAGNOSIS — N186 End stage renal disease: Secondary | ICD-10-CM | POA: Diagnosis not present

## 2022-01-19 DIAGNOSIS — N2581 Secondary hyperparathyroidism of renal origin: Secondary | ICD-10-CM | POA: Diagnosis not present

## 2022-01-19 DIAGNOSIS — D509 Iron deficiency anemia, unspecified: Secondary | ICD-10-CM | POA: Diagnosis not present

## 2022-01-21 DIAGNOSIS — N186 End stage renal disease: Secondary | ICD-10-CM | POA: Diagnosis not present

## 2022-01-21 DIAGNOSIS — N2581 Secondary hyperparathyroidism of renal origin: Secondary | ICD-10-CM | POA: Diagnosis not present

## 2022-01-21 DIAGNOSIS — D631 Anemia in chronic kidney disease: Secondary | ICD-10-CM | POA: Diagnosis not present

## 2022-01-21 DIAGNOSIS — D509 Iron deficiency anemia, unspecified: Secondary | ICD-10-CM | POA: Diagnosis not present

## 2022-01-23 DIAGNOSIS — N186 End stage renal disease: Secondary | ICD-10-CM | POA: Diagnosis not present

## 2022-01-23 DIAGNOSIS — D509 Iron deficiency anemia, unspecified: Secondary | ICD-10-CM | POA: Diagnosis not present

## 2022-01-23 DIAGNOSIS — N2581 Secondary hyperparathyroidism of renal origin: Secondary | ICD-10-CM | POA: Diagnosis not present

## 2022-01-23 DIAGNOSIS — D631 Anemia in chronic kidney disease: Secondary | ICD-10-CM | POA: Diagnosis not present

## 2022-01-25 DIAGNOSIS — N186 End stage renal disease: Secondary | ICD-10-CM | POA: Diagnosis not present

## 2022-01-25 DIAGNOSIS — N2581 Secondary hyperparathyroidism of renal origin: Secondary | ICD-10-CM | POA: Diagnosis not present

## 2022-01-25 DIAGNOSIS — D509 Iron deficiency anemia, unspecified: Secondary | ICD-10-CM | POA: Diagnosis not present

## 2022-01-25 DIAGNOSIS — D631 Anemia in chronic kidney disease: Secondary | ICD-10-CM | POA: Diagnosis not present

## 2022-01-28 ENCOUNTER — Telehealth: Payer: Self-pay | Admitting: Family Medicine

## 2022-01-28 DIAGNOSIS — Z7901 Long term (current) use of anticoagulants: Secondary | ICD-10-CM | POA: Diagnosis not present

## 2022-01-28 DIAGNOSIS — N2581 Secondary hyperparathyroidism of renal origin: Secondary | ICD-10-CM | POA: Diagnosis not present

## 2022-01-28 DIAGNOSIS — N186 End stage renal disease: Secondary | ICD-10-CM | POA: Diagnosis not present

## 2022-01-28 DIAGNOSIS — D631 Anemia in chronic kidney disease: Secondary | ICD-10-CM | POA: Diagnosis not present

## 2022-01-28 DIAGNOSIS — D509 Iron deficiency anemia, unspecified: Secondary | ICD-10-CM | POA: Diagnosis not present

## 2022-01-28 NOTE — Telephone Encounter (Signed)
Left message for patient to call back to schedule Medicare Annual Wellness Visit   Last AWV  01/30/21  Please schedule at anytime with [place] if patient calls the office back.     Any questions, please call me at [phone]

## 2022-01-30 DIAGNOSIS — D509 Iron deficiency anemia, unspecified: Secondary | ICD-10-CM | POA: Diagnosis not present

## 2022-01-30 DIAGNOSIS — N2581 Secondary hyperparathyroidism of renal origin: Secondary | ICD-10-CM | POA: Diagnosis not present

## 2022-01-30 DIAGNOSIS — N186 End stage renal disease: Secondary | ICD-10-CM | POA: Diagnosis not present

## 2022-01-30 DIAGNOSIS — D631 Anemia in chronic kidney disease: Secondary | ICD-10-CM | POA: Diagnosis not present

## 2022-02-01 DIAGNOSIS — N2581 Secondary hyperparathyroidism of renal origin: Secondary | ICD-10-CM | POA: Diagnosis not present

## 2022-02-01 DIAGNOSIS — Z992 Dependence on renal dialysis: Secondary | ICD-10-CM | POA: Diagnosis not present

## 2022-02-01 DIAGNOSIS — D509 Iron deficiency anemia, unspecified: Secondary | ICD-10-CM | POA: Diagnosis not present

## 2022-02-01 DIAGNOSIS — N186 End stage renal disease: Secondary | ICD-10-CM | POA: Diagnosis not present

## 2022-02-01 DIAGNOSIS — Z7901 Long term (current) use of anticoagulants: Secondary | ICD-10-CM | POA: Diagnosis not present

## 2022-02-01 DIAGNOSIS — D631 Anemia in chronic kidney disease: Secondary | ICD-10-CM | POA: Diagnosis not present

## 2022-02-04 DIAGNOSIS — D631 Anemia in chronic kidney disease: Secondary | ICD-10-CM | POA: Diagnosis not present

## 2022-02-04 DIAGNOSIS — N2581 Secondary hyperparathyroidism of renal origin: Secondary | ICD-10-CM | POA: Diagnosis not present

## 2022-02-04 DIAGNOSIS — D509 Iron deficiency anemia, unspecified: Secondary | ICD-10-CM | POA: Diagnosis not present

## 2022-02-04 DIAGNOSIS — N186 End stage renal disease: Secondary | ICD-10-CM | POA: Diagnosis not present

## 2022-02-06 DIAGNOSIS — N186 End stage renal disease: Secondary | ICD-10-CM | POA: Diagnosis not present

## 2022-02-06 DIAGNOSIS — N2581 Secondary hyperparathyroidism of renal origin: Secondary | ICD-10-CM | POA: Diagnosis not present

## 2022-02-06 DIAGNOSIS — D509 Iron deficiency anemia, unspecified: Secondary | ICD-10-CM | POA: Diagnosis not present

## 2022-02-06 DIAGNOSIS — D631 Anemia in chronic kidney disease: Secondary | ICD-10-CM | POA: Diagnosis not present

## 2022-02-09 DIAGNOSIS — N186 End stage renal disease: Secondary | ICD-10-CM | POA: Diagnosis not present

## 2022-02-09 DIAGNOSIS — N2581 Secondary hyperparathyroidism of renal origin: Secondary | ICD-10-CM | POA: Diagnosis not present

## 2022-02-09 DIAGNOSIS — D631 Anemia in chronic kidney disease: Secondary | ICD-10-CM | POA: Diagnosis not present

## 2022-02-09 DIAGNOSIS — Z7901 Long term (current) use of anticoagulants: Secondary | ICD-10-CM | POA: Diagnosis not present

## 2022-02-09 DIAGNOSIS — D509 Iron deficiency anemia, unspecified: Secondary | ICD-10-CM | POA: Diagnosis not present

## 2022-02-11 DIAGNOSIS — D631 Anemia in chronic kidney disease: Secondary | ICD-10-CM | POA: Diagnosis not present

## 2022-02-11 DIAGNOSIS — D509 Iron deficiency anemia, unspecified: Secondary | ICD-10-CM | POA: Diagnosis not present

## 2022-02-11 DIAGNOSIS — N2581 Secondary hyperparathyroidism of renal origin: Secondary | ICD-10-CM | POA: Diagnosis not present

## 2022-02-11 DIAGNOSIS — N186 End stage renal disease: Secondary | ICD-10-CM | POA: Diagnosis not present

## 2022-02-13 DIAGNOSIS — D631 Anemia in chronic kidney disease: Secondary | ICD-10-CM | POA: Diagnosis not present

## 2022-02-13 DIAGNOSIS — N2581 Secondary hyperparathyroidism of renal origin: Secondary | ICD-10-CM | POA: Diagnosis not present

## 2022-02-13 DIAGNOSIS — N186 End stage renal disease: Secondary | ICD-10-CM | POA: Diagnosis not present

## 2022-02-13 DIAGNOSIS — D509 Iron deficiency anemia, unspecified: Secondary | ICD-10-CM | POA: Diagnosis not present

## 2022-02-16 DIAGNOSIS — Z7901 Long term (current) use of anticoagulants: Secondary | ICD-10-CM | POA: Diagnosis not present

## 2022-02-16 DIAGNOSIS — N186 End stage renal disease: Secondary | ICD-10-CM | POA: Diagnosis not present

## 2022-02-16 DIAGNOSIS — D631 Anemia in chronic kidney disease: Secondary | ICD-10-CM | POA: Diagnosis not present

## 2022-02-16 DIAGNOSIS — N2581 Secondary hyperparathyroidism of renal origin: Secondary | ICD-10-CM | POA: Diagnosis not present

## 2022-02-16 DIAGNOSIS — D509 Iron deficiency anemia, unspecified: Secondary | ICD-10-CM | POA: Diagnosis not present

## 2022-02-18 DIAGNOSIS — D509 Iron deficiency anemia, unspecified: Secondary | ICD-10-CM | POA: Diagnosis not present

## 2022-02-18 DIAGNOSIS — N2581 Secondary hyperparathyroidism of renal origin: Secondary | ICD-10-CM | POA: Diagnosis not present

## 2022-02-18 DIAGNOSIS — D631 Anemia in chronic kidney disease: Secondary | ICD-10-CM | POA: Diagnosis not present

## 2022-02-18 DIAGNOSIS — N186 End stage renal disease: Secondary | ICD-10-CM | POA: Diagnosis not present

## 2022-02-19 DIAGNOSIS — Z1231 Encounter for screening mammogram for malignant neoplasm of breast: Secondary | ICD-10-CM | POA: Diagnosis not present

## 2022-02-19 LAB — HM MAMMOGRAPHY

## 2022-02-20 DIAGNOSIS — D631 Anemia in chronic kidney disease: Secondary | ICD-10-CM | POA: Diagnosis not present

## 2022-02-20 DIAGNOSIS — N2581 Secondary hyperparathyroidism of renal origin: Secondary | ICD-10-CM | POA: Diagnosis not present

## 2022-02-20 DIAGNOSIS — N186 End stage renal disease: Secondary | ICD-10-CM | POA: Diagnosis not present

## 2022-02-20 DIAGNOSIS — D509 Iron deficiency anemia, unspecified: Secondary | ICD-10-CM | POA: Diagnosis not present

## 2022-02-21 ENCOUNTER — Other Ambulatory Visit: Payer: Self-pay | Admitting: Cardiology

## 2022-02-23 DIAGNOSIS — D631 Anemia in chronic kidney disease: Secondary | ICD-10-CM | POA: Diagnosis not present

## 2022-02-23 DIAGNOSIS — D509 Iron deficiency anemia, unspecified: Secondary | ICD-10-CM | POA: Diagnosis not present

## 2022-02-23 DIAGNOSIS — N186 End stage renal disease: Secondary | ICD-10-CM | POA: Diagnosis not present

## 2022-02-23 DIAGNOSIS — Z7901 Long term (current) use of anticoagulants: Secondary | ICD-10-CM | POA: Diagnosis not present

## 2022-02-23 DIAGNOSIS — N2581 Secondary hyperparathyroidism of renal origin: Secondary | ICD-10-CM | POA: Diagnosis not present

## 2022-02-25 DIAGNOSIS — D631 Anemia in chronic kidney disease: Secondary | ICD-10-CM | POA: Diagnosis not present

## 2022-02-25 DIAGNOSIS — D509 Iron deficiency anemia, unspecified: Secondary | ICD-10-CM | POA: Diagnosis not present

## 2022-02-25 DIAGNOSIS — N2581 Secondary hyperparathyroidism of renal origin: Secondary | ICD-10-CM | POA: Diagnosis not present

## 2022-02-25 DIAGNOSIS — N186 End stage renal disease: Secondary | ICD-10-CM | POA: Diagnosis not present

## 2022-02-27 DIAGNOSIS — N186 End stage renal disease: Secondary | ICD-10-CM | POA: Diagnosis not present

## 2022-02-27 DIAGNOSIS — D509 Iron deficiency anemia, unspecified: Secondary | ICD-10-CM | POA: Diagnosis not present

## 2022-02-27 DIAGNOSIS — D631 Anemia in chronic kidney disease: Secondary | ICD-10-CM | POA: Diagnosis not present

## 2022-02-27 DIAGNOSIS — N2581 Secondary hyperparathyroidism of renal origin: Secondary | ICD-10-CM | POA: Diagnosis not present

## 2022-03-02 DIAGNOSIS — N2581 Secondary hyperparathyroidism of renal origin: Secondary | ICD-10-CM | POA: Diagnosis not present

## 2022-03-02 DIAGNOSIS — D631 Anemia in chronic kidney disease: Secondary | ICD-10-CM | POA: Diagnosis not present

## 2022-03-02 DIAGNOSIS — Z7901 Long term (current) use of anticoagulants: Secondary | ICD-10-CM | POA: Diagnosis not present

## 2022-03-02 DIAGNOSIS — N186 End stage renal disease: Secondary | ICD-10-CM | POA: Diagnosis not present

## 2022-03-02 DIAGNOSIS — D509 Iron deficiency anemia, unspecified: Secondary | ICD-10-CM | POA: Diagnosis not present

## 2022-03-03 ENCOUNTER — Encounter: Payer: Self-pay | Admitting: Family Medicine

## 2022-03-03 ENCOUNTER — Ambulatory Visit (HOSPITAL_BASED_OUTPATIENT_CLINIC_OR_DEPARTMENT_OTHER)
Admission: RE | Admit: 2022-03-03 | Discharge: 2022-03-03 | Disposition: A | Discharge status: Home / Self Care | Payer: Medicare HMO | Source: Ambulatory Visit | Attending: Family Medicine | Admitting: Family Medicine

## 2022-03-03 ENCOUNTER — Ambulatory Visit (INDEPENDENT_AMBULATORY_CARE_PROVIDER_SITE_OTHER): Payer: Medicare HMO | Admitting: Family Medicine

## 2022-03-03 VITALS — BP 148/63 | HR 76 | Temp 97.5°F | Resp 16 | Ht 66.0 in | Wt 133.0 lb

## 2022-03-03 DIAGNOSIS — M5134 Other intervertebral disc degeneration, thoracic region: Secondary | ICD-10-CM | POA: Diagnosis not present

## 2022-03-03 DIAGNOSIS — M549 Dorsalgia, unspecified: Secondary | ICD-10-CM | POA: Diagnosis not present

## 2022-03-03 MED ORDER — LIDOCAINE 4 % EX PTCH: 1.0000 | MEDICATED_PATCH | CUTANEOUS | 1 refills | Status: DC

## 2022-03-03 NOTE — Progress Notes (Signed)
   Acute Office Visit  Subjective:     Patient ID: Jillian Hayes, female    DOB: 26-Aug-1945, 77 y.o.   MRN: [id number]  Chief Complaint  Patient presents with   Back Pain    Started about month ago     Back Pain: Patient presents for presents evaluation of upper back problems.  Symptoms have been present for a few months and include pain in upper back, between shoulder blades (aching in character; 3-8/10 in severity). Initial inciting event: none. Symptoms are worst: evening. Alleviating factors identifiable by patient are  ice, massage, icy/hot cream . Exacerbating factors identifiable by patient are prolonged sitting and washing dishes, cooking, driving . Treatments so far initiated by patient:  ice, heat, massage  Previous workup: none. Previous treatments: none.       Objective:    BP (!) 148/63   Pulse 76   Temp (!) 97.5 F (36.4 C)   Resp 16   Ht '5\' 6"'$  (1.676 m)   Wt 133 lb (60.3 kg)   SpO2 95%   BMI 21.47 kg/m    Physical Exam Vitals reviewed.  Constitutional:      General: She is not in acute distress.    Appearance: Normal appearance. She is not ill-appearing.  Musculoskeletal:        General: No swelling. Normal range of motion.     Comments: Tenderness to palpation and muscle tension noted to bilateral paraspinal muscles of upper back  Neurological:     General: No focal deficit present.     Mental Status: She is alert and oriented to person, place, and time. Mental status is at baseline.  Psychiatric:        Mood and Affect: Mood normal.        Behavior: Behavior normal.        Thought Content: Thought content normal.        Judgment: Judgment normal.     No results found for any visits on 03/03/22.      Assessment & Plan:   Problem List Items Addressed This Visit   None Visit Diagnoses     Upper back pain    -  Primary Xray today Referral to physical therapy Continue ice, heat, massage, home stretches Try lidocaine patches over most  painful areas Try to keep good posture  Patient aware of signs/symptoms requiring further/urgent evaluation.      Relevant Medications   lidocaine 4 %   Other Relevant Orders   Ambulatory referral to Physical Therapy   DG Thoracic Spine 2 View       Meds ordered this encounter  Medications   lidocaine 4 %    Sig: Place 1 patch onto the skin daily.    Dispense:  10 patch    Refill:  1    Return if symptoms worsen or fail to improve.  Terrilyn Saver, NP

## 2022-03-03 NOTE — Patient Instructions (Signed)
Upper back pain: Xray today Referral to physical therapy Continue ice, heat, massage, home stretches Try lidocaine patches over most painful areas Try to keep good posture

## 2022-03-04 DIAGNOSIS — N186 End stage renal disease: Secondary | ICD-10-CM | POA: Diagnosis not present

## 2022-03-04 DIAGNOSIS — D631 Anemia in chronic kidney disease: Secondary | ICD-10-CM | POA: Diagnosis not present

## 2022-03-04 DIAGNOSIS — D509 Iron deficiency anemia, unspecified: Secondary | ICD-10-CM | POA: Diagnosis not present

## 2022-03-04 DIAGNOSIS — Z992 Dependence on renal dialysis: Secondary | ICD-10-CM | POA: Diagnosis not present

## 2022-03-04 DIAGNOSIS — N2581 Secondary hyperparathyroidism of renal origin: Secondary | ICD-10-CM | POA: Diagnosis not present

## 2022-03-05 ENCOUNTER — Other Ambulatory Visit: Payer: Self-pay

## 2022-03-05 ENCOUNTER — Ambulatory Visit: Payer: Medicare HMO | Attending: Family Medicine | Admitting: Physical Therapy

## 2022-03-05 DIAGNOSIS — R293 Abnormal posture: Secondary | ICD-10-CM | POA: Insufficient documentation

## 2022-03-05 DIAGNOSIS — M546 Pain in thoracic spine: Secondary | ICD-10-CM | POA: Diagnosis not present

## 2022-03-05 DIAGNOSIS — M6281 Muscle weakness (generalized): Secondary | ICD-10-CM | POA: Insufficient documentation

## 2022-03-05 DIAGNOSIS — M549 Dorsalgia, unspecified: Secondary | ICD-10-CM | POA: Diagnosis not present

## 2022-03-05 DIAGNOSIS — M62830 Muscle spasm of back: Secondary | ICD-10-CM | POA: Diagnosis not present

## 2022-03-05 DIAGNOSIS — M5459 Other low back pain: Secondary | ICD-10-CM | POA: Insufficient documentation

## 2022-03-05 NOTE — Therapy (Signed)
OUTPATIENT PHYSICAL THERAPY THORACOLUMBAR EVALUATION   Patient Name: Jillian Hayes MRN: [id number] DOB:July 06, 1945, 77 y.o., female Today's Date: 03/05/2022  END OF SESSION:  PT End of Session - 03/05/22 1359     Visit Number 1    Date for PT Re-Evaluation 04/30/22    Authorization Type [employer]    Progress Note Due on Visit 10    PT Start Time 1400    PT Stop Time 1453    PT Time Calculation (min) 53 min    Activity Tolerance Patient tolerated treatment well    Behavior During Therapy [facility] for tasks assessed/performed             Past Medical History:  Diagnosis Date   Advanced care planning/counseling discussion 06/10/2014   05/31/2014 patient presents copy of HCP and Living Will   Anemia    iron deficiency   Anxiety    Arthritis    "in hands"   Atrial fibrillation ([place])    Benign fundic gland polyps of stomach    Bladder polyps 06/25/2010   Chronic headaches 60/73/7106   Complication of anesthesia    "woke up at the end of a cyst removal surgery in 1991"   Depression 1991   hospitalized   Diverticulosis    ESRD (end stage renal disease) ([place]) 11/09/2015   HD on MWF   External prolapsed hemorrhoids    History of chicken pox 06/25/2010   Hypercalcemia 02/18/2014   Hypertension    Insomnia 06/24/2010   Multiple chemical sensitivity syndrome 06/25/2010   Proteinuria 02/18/2014   Renal insufficiency 03/26/2011   Valvular heart disease 04/28/2016   Past Surgical History:  Procedure Laterality Date   AV FISTULA PLACEMENT Left    x4   COLONOSCOPY  2014   cyst on left breast removed Left 1991   benign   ESOPHAGOGASTRODUODENOSCOPY (EGD) WITH ESOPHAGEAL DILATION  2014   LAPAROSCOPIC BILATERAL SALPINGO OOPHERECTOMY Bilateral 08/20/2020   Procedure: LAPAROSCOPIC BILATERAL SALPINGO OOPHORECTOMY;  Surgeon: Megan Salon, MD;  Location: [facility];  Service: Gynecology;  Laterality: Bilateral;   NASAL SEPTUM SURGERY  1986   rhinoplasty   polyps on bladder removed   1972   benign   RIGHT HEART CATH N/A 04/10/2021   Procedure: RIGHT HEART CATH;  Surgeon: Larey Dresser, MD;  Location: [facility];  Service: Cardiovascular;  Laterality: N/A;   TONSILLECTOMY  1962   Patient Active Problem List   Diagnosis Date Noted   Pulmonary hypertension, primary ([place]) 03/09/2021   Low TSH level 03/09/2021   Right knee pain 03/06/2021   Primary osteoarthritis of right knee 02/25/2021   Adnexal mass    A-V fistula ([place]) 08/03/2019   Nonrheumatic mitral valve regurgitation 06/12/2019   Edema 04/09/2019   Drug-induced constipation 10/18/2018   External prolapsed hemorrhoids 09/25/2018   Hyperphosphatemia 03/09/2018   Leg swelling 03/03/2018   Long term (current) use of anticoagulants 11/01/2017   Atrial fibrillation ([place]) 10/25/2017   Hyperparathyroidism, unspecified ([place]) 05/04/2017   DDD (degenerative disc disease), lumbar 04/07/2017   Hearing loss 01/04/2017   Hyperlipidemia 01/04/2017   Arthritis of fingers of both hands 10/01/2016   Chronic rhinitis 10/01/2016   Nonrheumatic aortic valve insufficiency 09/29/2016   Bruit 09/29/2016   Palpitation 09/22/2016   Aortic valve disease 04/28/2016   Anemia in ESRD (end-stage renal disease) ([place]) 03/03/2016   Sun-damaged skin 01/21/2016   ESRD (end stage renal disease) ([place]) 11/09/2015   Advanced care planning/counseling discussion 06/10/2014   Pedal  edema 03/28/2014   Proteinuria 02/18/2014   Hypercalcemia 02/18/2014   Abnormal EKG 01/22/2014   HTN (hypertension) 01/22/2014   Dermatitis 06/30/2013   Abnormal thyroid function test 06/25/2013   Cervical cancer screening 06/10/2012   Preventative health care 11/09/2011   Multiple chemical sensitivity syndrome 06/25/2010   History of chicken pox 06/25/2010   Bladder polyps 06/25/2010   History of cardiac dysrhythmia 06/25/2010   Insomnia 06/24/2010   Fatigue 06/24/2010   Chronic headaches 06/24/2010   Allergy    Anemia    Depression     PCP:  [name], MD   REFERRING PROVIDER: Terrilyn Saver, NP   REFERRING DIAG: M54.9 (ICD-10-CM) - Upper back pain   THERAPY DIAG:  Pain in thoracic spine  Abnormal posture  Muscle spasm of back  Other low back pain  Muscle weakness (generalized)  RATIONALE FOR EVALUATION AND TREATMENT: Rehabilitation  ONSET DATE: late Nov 2023  NEXT MD VISIT: 05/05/22 with PCP   SUBJECTIVE:                                                                                                                                                                                                         SUBJECTIVE STATEMENT: Pt reports a pain like a toothache in the back of L>R shoulder, worsening since late Nov 2023 w/o known trigger. She also reports intermittent "grabbing" pain in her low back. No radicular pain/symptoms. She sees a Restaurant manager, fast food once a month and works out with 3# weights, but lately this pain has been getting worse. She notes a clicking mid way in her shoulder blades when working out with the weights. Pain now affecting her sleep which is already limited by insomnia.  PAIN: Are you having pain? Yes: NPRS scale: 4, at worst up to 8/10 Pain location: L>R scapula and medial periscapular muscles Pain description: constant dull ache, stabbing at times Aggravating factors: meal prep, driving, getting clothes out of washer/dryer, sweeping, anything that involves lifting, prolonged sitting during HD (3-4 hrs) Relieving factors: laying down and resting used to help but no longer, ice & Tylenol take the edge off but pain returns w/in an hour, massage or tennis release  PERTINENT HISTORY:  mild scoliosis; OA - R knee, hands; lumbar DDD; anemia; a-fib; aortic valve disease; HTN; ESRD - HD MWF; hearing loss; anxiety; depression; chronic headaches; insomnia; fatigue  PRECAUTIONS: Other: HD MWF - fistula in L upper arm  WEIGHT BEARING RESTRICTIONS: No  FALLS:  Has patient fallen in last 6 months?  No  LIVING ENVIRONMENT: Lives with: lives  with their spouse and lives with their daughter Lives in: House/apartment Stairs: Yes: Internal: 14 steps; on right going up (dtr lives upstairs, she rarely goes up) Has following equipment at home: shower chair  OCCUPATION: Retired  PLOF: Independent and Leisure: reading on Kindle, walking when she has the energy ~2x/wk or when shopping  PATIENT GOALS: "To be able to sleep w/o back hurting. To be able to sit through HD and do things around the house w/o pain."   OBJECTIVE:   DIAGNOSTIC FINDINGS:  03/03/22 - Thoracic spine x-ray: IMPRESSION: 1. No acute fracture. 2. Mild degenerative changes in the thoracic spine.  PATIENT SURVEYS:  NDI 18 / 50 = 36.0 %  COGNITION:  Overall cognitive status: Within functional limits for tasks assessed    SENSATION: WFL  MUSCLE LENGTH: Hamstrings: mild/mod tight L>R ITB: mod tight R Piriformis: mod tight R Hip flexors: mod tight R, mild tight L Quads: mod tight R, mild tight L Heelcord: NT  POSTURE:  rounded shoulders, forward head, increased thoracic kyphosis, and mils scoliosis  PALPATION: TTP with increased muscle tension and taut bands in R>L thoracolumbar paraspinals, L >R cervicothoracic paraspinals and periscapular muscles   CERVICAL ROM:   Active ROM AROM (deg) eval  Flexion 56  Extension 24  Right lateral flexion 38  Left lateral flexion 20  Right rotation 56  Left rotation 71   (Blank rows = not tested)  UPPER EXTREMITY ROM:  Active ROM Right eval Left eval  Shoulder flexion WNL WNL  Shoulder extension 48 40  Shoulder abduction 170 144  Shoulder adduction    Shoulder internal rotation FIR - WNL FIR - WNL  Shoulder external rotation FER - WNL FER - WNL  Elbow flexion    Elbow extension    Wrist flexion    Wrist extension    Wrist ulnar deviation    Wrist radial deviation    Wrist pronation    Wrist supination     (Blank rows = not tested)   LUMBAR ROM:    Active  A/PROM  eval  Flexion Hands to ankles  Extension 50% limited  Right lateral flexion Hand to lateral knee  Left lateral flexion Hand to lateral knee  Right rotation 50% limited  Left rotation 50% limited  (Blank rows = not tested)  LOWER EXTREMITY ROM:    Grossly WFL/WNL  UPPER EXTREMITY MMT:  MMT Right eval Left eval  Shoulder flexion 4+ 4- *  Shoulder extension 4+ 4  Shoulder abduction 4 * 4- *  Shoulder adduction    Shoulder internal rotation 4+ 4  Shoulder external rotation 4+ 4-  Middle trapezius 4 4-  Lower trapezius    Elbow flexion    Elbow extension    Wrist flexion    Wrist extension    Wrist ulnar deviation    Wrist radial deviation    Wrist pronation    Wrist supination    Grip strength     (Blank rows = not tested)  LOWER EXTREMITY MMT:    MMT Right eval Left eval  Hip flexion 4- 4-  Hip extension unable unable  Hip abduction 4- 4-  Hip adduction 4 4-  Hip internal rotation 4 4-  Hip external rotation 4- 4-  Knee flexion 5 5  Knee extension 5 5  Ankle dorsiflexion 4- 4-  Ankle plantarflexion    Ankle inversion    Ankle eversion     (Blank rows = not tested)   TODAY'S TREATMENT:  03/05/22 THERAPEUTIC EXERCISE: Instruction in initial HEP (see below) to improve flexibility, strength and mobility.  Verbal and tactile cues throughout for technique.    PATIENT EDUCATION:  Education details: PT eval findings, anticipated POC, initial HEP, and postural awareness Person educated: Patient Education method: Explanation, Demonstration, and Handouts Education comprehension: verbalized understanding, returned demonstration, and needs further education  HOME EXERCISE PROGRAM: Access Code: [email or web address] URL: [email or web address] Date: 03/05/2022 Prepared by: Annie Paras  Exercises - Seated Cervical Retraction  - 2 x daily - 7 x weekly - 2 sets - 10 reps - 3-5 sec hold - Seated Scapular Retraction  - 2 x daily - 7 x weekly  - 2 sets - 10 reps - 3-5 sec hold - Seated Scapular Retraction with External Rotation  - 2 x daily - 7 x weekly - 2 sets - 10 reps - 3 sec hold   ASSESSMENT:  CLINICAL IMPRESSION: Jillian Hayes is a 78 y.o. female  who was seen today for physical therapy evaluation and treatment for upper back pain. Upper back pain mostly localized to L>R scapular and periscapular muscles, but intermittent LBP pain also reported. She reports pain has been chronic but seems to have worsened since late Nov 2023 w/o known triggering event. Deficits include abnormal posture, decreased flexibility with TTP and abnormal muscle tension in paraspinal and periscapular muscles, as well as core and proximal UE/LE weakness. Pain and above deficits limit positional tolerance during HD treatments as well as her ability to perform ADLs and typical household chores. Kegan will benefit from skilled PT to address above deficits to improve mobility and activity tolerance with decreased pain interference.   OBJECTIVE IMPAIRMENTS: decreased activity tolerance, decreased knowledge of condition, decreased mobility, difficulty walking, decreased ROM, decreased strength, hypomobility, increased fascial restrictions, impaired perceived functional ability, increased muscle spasms, impaired flexibility, impaired UE functional use, improper body mechanics, postural dysfunction, and pain.   ACTIVITY LIMITATIONS: carrying, lifting, bending, sitting, standing, squatting, sleeping, reach over head, hygiene/grooming, and caring for others  PARTICIPATION LIMITATIONS: meal prep, cleaning, laundry, driving, shopping, and community activity  PERSONAL FACTORS: Age, Past/current experiences, Time since onset of injury/illness/exacerbation, and 3+ comorbidities: mild scoliosis; OA - R knee, hands; lumbar DDD; anemia; a-fib; aortic valve disease; HTN; ESRD - HD MWF; hearing loss; anxiety; depression; chronic headaches; insomnia; fatigue  are also affecting  patient's functional outcome.   REHAB POTENTIAL: Good  CLINICAL DECISION MAKING: Evolving/moderate complexity  EVALUATION COMPLEXITY: Moderate   GOALS: Goals reviewed with patient? Yes  SHORT TERM GOALS: Target date: 04/02/2022  Patient will be independent with initial HEP to improve outcomes and carryover.  Baseline:  Goal status: INITIAL  2.  Patient will be able to verbalize and demonstrate proper posture with sitting/standing to decrease/reduce muscle tightness/reinjury .  Baseline:  Goal status: INITIAL  LONG TERM GOALS: Target date: 04/30/2022  Patient will be independent with ongoing/advanced HEP for self-management at home.  Baseline:  Goal status: INITIAL  2.  Patient will report 75% improvement in back and periscapular pain pain to improve QOL.  Baseline:  Goal status: INITIAL  3.  Patient to demonstrate ability to achieve and maintain good spinal alignment/posturing and body mechanics needed for daily activities. Baseline:  Goal status: INITIAL  4.  Patient will demonstrate functional pain free cervical and lumbar ROM to perform ADLs.   Baseline:  refer to above ROM tables Goal status: INITIAL  5.  Patient will demonstrate improved proximal B LE strength to >/= 4+/5 for improved stability and  ease of mobility . Baseline: refer to above LE MMT table Goal status: INITIAL  6.  Patient will demonstrate improved B shoulder and scapular strength to >/= 4+/5 for functional UE use w/o pain interference.  Baseline: refer to above UE MMT table Goal status: INITIAL   7. Patient will report </= 21% on NDI to demonstrate improved functional ability with decreased pain interference. Baseline: 18 / 50 = 36.0 % Goal status: INITIAL  8.  Patient will tolerate 3 hours of sitting w/o increased pain to allow for improved activity tolerance during HD treatments. Baseline:  Goal status: INITIAL  9.  Patient will report </= 25% sleep disturbance due to back or periscapular  pain. Baseline: sleep disturbed for up to 3-5 hrs per NDI Goal status: INITIAL    PLAN:  PT FREQUENCY: 2x/week  PT DURATION: 8 weeks  PLANNED INTERVENTIONS: Therapeutic exercises, Therapeutic activity, Neuromuscular re-education, Balance training, Gait training, Patient/Family education, Self Care, Joint mobilization, Aquatic Therapy, Dry Needling, Electrical stimulation, Spinal manipulation, Cryotherapy, Moist heat, Taping, Ultrasound, Manual therapy, and Re-evaluation  PLAN FOR NEXT SESSION: Review initial HEP; postural education; progress postural and lumbopelvic flexibility/stretching and strengthening; MT +/- DN to address abnormal muscle tension in periscapular and paraspinal muscles   Percival Spanish, PT 03/05/2022, 3:32 PM

## 2022-03-06 DIAGNOSIS — D509 Iron deficiency anemia, unspecified: Secondary | ICD-10-CM | POA: Diagnosis not present

## 2022-03-06 DIAGNOSIS — N2581 Secondary hyperparathyroidism of renal origin: Secondary | ICD-10-CM | POA: Diagnosis not present

## 2022-03-06 DIAGNOSIS — N186 End stage renal disease: Secondary | ICD-10-CM | POA: Diagnosis not present

## 2022-03-06 DIAGNOSIS — D631 Anemia in chronic kidney disease: Secondary | ICD-10-CM | POA: Diagnosis not present

## 2022-03-09 DIAGNOSIS — N2581 Secondary hyperparathyroidism of renal origin: Secondary | ICD-10-CM | POA: Diagnosis not present

## 2022-03-09 DIAGNOSIS — D631 Anemia in chronic kidney disease: Secondary | ICD-10-CM | POA: Diagnosis not present

## 2022-03-09 DIAGNOSIS — Z7901 Long term (current) use of anticoagulants: Secondary | ICD-10-CM | POA: Diagnosis not present

## 2022-03-09 DIAGNOSIS — N186 End stage renal disease: Secondary | ICD-10-CM | POA: Diagnosis not present

## 2022-03-09 DIAGNOSIS — D509 Iron deficiency anemia, unspecified: Secondary | ICD-10-CM | POA: Diagnosis not present

## 2022-03-10 ENCOUNTER — Ambulatory Visit: Payer: Medicare HMO

## 2022-03-10 DIAGNOSIS — R293 Abnormal posture: Secondary | ICD-10-CM

## 2022-03-10 DIAGNOSIS — M6281 Muscle weakness (generalized): Secondary | ICD-10-CM

## 2022-03-10 DIAGNOSIS — M549 Dorsalgia, unspecified: Secondary | ICD-10-CM | POA: Diagnosis not present

## 2022-03-10 DIAGNOSIS — M62830 Muscle spasm of back: Secondary | ICD-10-CM | POA: Diagnosis not present

## 2022-03-10 DIAGNOSIS — M5459 Other low back pain: Secondary | ICD-10-CM | POA: Diagnosis not present

## 2022-03-10 DIAGNOSIS — M546 Pain in thoracic spine: Secondary | ICD-10-CM

## 2022-03-10 NOTE — Therapy (Signed)
OUTPATIENT PHYSICAL THERAPY TREATMENT   Patient Name: Jillian Hayes MRN: [id number] DOB:January 24, 1946, 77 y.o., female Today's Date: 03/10/2022  END OF SESSION:  PT End of Session - 03/10/22 1750     Visit Number 2    Date for PT Re-Evaluation 04/30/22    Authorization Type [employer]    Progress Note Due on Visit 10    PT Start Time 1704    PT Stop Time 1746    PT Time Calculation (min) 42 min    Activity Tolerance Patient tolerated treatment well    Behavior During Therapy [facility] for tasks assessed/performed              Past Medical History:  Diagnosis Date   Advanced care planning/counseling discussion 06/10/2014   05/31/2014 patient presents copy of HCP and Living Will   Anemia    iron deficiency   Anxiety    Arthritis    "in hands"   Atrial fibrillation ([place])    Benign fundic gland polyps of stomach    Bladder polyps 06/25/2010   Chronic headaches 47/42/5956   Complication of anesthesia    "woke up at the end of a cyst removal surgery in 1991"   Depression 1991   hospitalized   Diverticulosis    ESRD (end stage renal disease) ([place]) 11/09/2015   HD on MWF   External prolapsed hemorrhoids    History of chicken pox 06/25/2010   Hypercalcemia 02/18/2014   Hypertension    Insomnia 06/24/2010   Multiple chemical sensitivity syndrome 06/25/2010   Proteinuria 02/18/2014   Renal insufficiency 03/26/2011   Valvular heart disease 04/28/2016   Past Surgical History:  Procedure Laterality Date   AV FISTULA PLACEMENT Left    x4   COLONOSCOPY  2014   cyst on left breast removed Left 1991   benign   ESOPHAGOGASTRODUODENOSCOPY (EGD) WITH ESOPHAGEAL DILATION  2014   LAPAROSCOPIC BILATERAL SALPINGO OOPHERECTOMY Bilateral 08/20/2020   Procedure: LAPAROSCOPIC BILATERAL SALPINGO OOPHORECTOMY;  Surgeon: Megan Salon, MD;  Location: [place];  Service: Gynecology;  Laterality: Bilateral;   NASAL SEPTUM SURGERY  1986   rhinoplasty   polyps on bladder removed  1972    benign   RIGHT HEART CATH N/A 04/10/2021   Procedure: RIGHT HEART CATH;  Surgeon: Larey Dresser, MD;  Location: [place] [facility];  Service: Cardiovascular;  Laterality: N/A;   TONSILLECTOMY  1962   Patient Active Problem List   Diagnosis Date Noted   Pulmonary hypertension, primary ([place]) 03/09/2021   Low TSH level 03/09/2021   Right knee pain 03/06/2021   Primary osteoarthritis of right knee 02/25/2021   Adnexal mass    A-V fistula ([place]) 08/03/2019   Nonrheumatic mitral valve regurgitation 06/12/2019   Edema 04/09/2019   Drug-induced constipation 10/18/2018   External prolapsed hemorrhoids 09/25/2018   Hyperphosphatemia 03/09/2018   Leg swelling 03/03/2018   Long term (current) use of anticoagulants 11/01/2017   Atrial fibrillation ([place]) 10/25/2017   Hyperparathyroidism, unspecified ([place]) 05/04/2017   DDD (degenerative disc disease), lumbar 04/07/2017   Hearing loss 01/04/2017   Hyperlipidemia 01/04/2017   Arthritis of fingers of both hands 10/01/2016   Chronic rhinitis 10/01/2016   Nonrheumatic aortic valve insufficiency 09/29/2016   Bruit 09/29/2016   Palpitation 09/22/2016   Aortic valve disease 04/28/2016   Anemia in ESRD (end-stage renal disease) ([place]) 03/03/2016   Sun-damaged skin 01/21/2016   ESRD (end stage renal disease) ([place]) 11/09/2015   Advanced care planning/counseling discussion 06/10/2014   Pedal  edema 03/28/2014   Proteinuria 02/18/2014   Hypercalcemia 02/18/2014   Abnormal EKG 01/22/2014   HTN (hypertension) 01/22/2014   Dermatitis 06/30/2013   Abnormal thyroid function test 06/25/2013   Cervical cancer screening 06/10/2012   Preventative health care 11/09/2011   Multiple chemical sensitivity syndrome 06/25/2010   History of chicken pox 06/25/2010   Bladder polyps 06/25/2010   History of cardiac dysrhythmia 06/25/2010   Insomnia 06/24/2010   Fatigue 06/24/2010   Chronic headaches 06/24/2010   Allergy    Anemia    Depression     PCP: [name], MD   REFERRING PROVIDER: Terrilyn Saver, NP   REFERRING DIAG: M54.9 (ICD-10-CM) - Upper back pain   THERAPY DIAG:  Pain in thoracic spine  Abnormal posture  Muscle spasm of back  Other low back pain  Muscle weakness (generalized)  RATIONALE FOR EVALUATION AND TREATMENT: Rehabilitation  ONSET DATE: late Nov 2023  NEXT MD VISIT: 05/05/22 with PCP   SUBJECTIVE:                                                                                                                                                                                                         SUBJECTIVE STATEMENT: Was doing exercises and on the last set of exercises  has some increased pain so she stopped HEP. Pt reports contined stiffness and soreness in thoracic area.  PAIN: Are you having pain? Yes: NPRS scale: 3/10 Pain location: L>R scapula and medial periscapular muscles Pain description: constant dull ache, stabbing at times Aggravating factors: meal prep, driving, getting clothes out of washer/dryer, sweeping, anything that involves lifting, prolonged sitting during HD (3-4 hrs) Relieving factors: laying down and resting used to help but no longer, ice & Tylenol take the edge off but pain returns w/in an hour, massage or tennis release  PERTINENT HISTORY:  mild scoliosis; OA - R knee, hands; lumbar DDD; anemia; a-fib; aortic valve disease; HTN; ESRD - HD MWF; hearing loss; anxiety; depression; chronic headaches; insomnia; fatigue  PRECAUTIONS: Other: HD MWF - fistula in L upper arm  WEIGHT BEARING RESTRICTIONS: No  FALLS:  Has patient fallen in last 6 months? No  LIVING ENVIRONMENT: Lives with: lives with their spouse and lives with their daughter Lives in: House/apartment Stairs: Yes: Internal: 14 steps; on right going up (dtr lives upstairs, she rarely goes up) Has following equipment at home: shower chair  OCCUPATION: Retired  PLOF: Independent and Leisure: reading on Kindle,  walking when she has the energy ~2x/wk or when shopping  PATIENT GOALS: "To  be able to sleep w/o back hurting. To be able to sit through HD and do things around the house w/o pain."   OBJECTIVE:   DIAGNOSTIC FINDINGS:  03/03/22 - Thoracic spine x-ray: IMPRESSION: 1. No acute fracture. 2. Mild degenerative changes in the thoracic spine.  PATIENT SURVEYS:  NDI 18 / 50 = 36.0 %  COGNITION:  Overall cognitive status: Within functional limits for tasks assessed    SENSATION: WFL  MUSCLE LENGTH: Hamstrings: mild/mod tight L>R ITB: mod tight R Piriformis: mod tight R Hip flexors: mod tight R, mild tight L Quads: mod tight R, mild tight L Heelcord: NT  POSTURE:  rounded shoulders, forward head, increased thoracic kyphosis, and mils scoliosis  PALPATION: TTP with increased muscle tension and taut bands in R>L thoracolumbar paraspinals, L >R cervicothoracic paraspinals and periscapular muscles   CERVICAL ROM:   Active ROM AROM (deg) eval  Flexion 56  Extension 24  Right lateral flexion 38  Left lateral flexion 20  Right rotation 56  Left rotation 71   (Blank rows = not tested)  UPPER EXTREMITY ROM:  Active ROM Right eval Left eval  Shoulder flexion WNL WNL  Shoulder extension 48 40  Shoulder abduction 170 144  Shoulder adduction    Shoulder internal rotation FIR - WNL FIR - WNL  Shoulder external rotation FER - WNL FER - WNL  Elbow flexion    Elbow extension    Wrist flexion    Wrist extension    Wrist ulnar deviation    Wrist radial deviation    Wrist pronation    Wrist supination     (Blank rows = not tested)   LUMBAR ROM:   Active  A/PROM  eval  Flexion Hands to ankles  Extension 50% limited  Right lateral flexion Hand to lateral knee  Left lateral flexion Hand to lateral knee  Right rotation 50% limited  Left rotation 50% limited  (Blank rows = not tested)  LOWER EXTREMITY ROM:    Grossly WFL/WNL  UPPER EXTREMITY MMT:  MMT Right eval  Left eval  Shoulder flexion 4+ 4- *  Shoulder extension 4+ 4  Shoulder abduction 4 * 4- *  Shoulder adduction    Shoulder internal rotation 4+ 4  Shoulder external rotation 4+ 4-  Middle trapezius 4 4-  Lower trapezius    Elbow flexion    Elbow extension    Wrist flexion    Wrist extension    Wrist ulnar deviation    Wrist radial deviation    Wrist pronation    Wrist supination    Grip strength     (Blank rows = not tested)  LOWER EXTREMITY MMT:    MMT Right eval Left eval  Hip flexion 4- 4-  Hip extension unable unable  Hip abduction 4- 4-  Hip adduction 4 4-  Hip internal rotation 4 4-  Hip external rotation 4- 4-  Knee flexion 5 5  Knee extension 5 5  Ankle dorsiflexion 4- 4-  Ankle plantarflexion    Ankle inversion    Ankle eversion     (Blank rows = not tested)   TODAY'S TREATMENT:  03/10/22 THERAPEUTIC EXERCISE: Instruction in initial HEP (see below) to improve flexibility, strength and mobility.  Verbal and tactile cues throughout for technique.  UBE L1x6 min Seated chin tuck x 10  Seated scap retraction 10x5" Seated ER with scap retraction x 10  Standing horizontal abd x 10 Standing rows YTB x 10  Standing shld ext  YTB x 10   Manual Therapy: to decrease muscle spasm, pain and improve mobility.  STM to L UT, LS, rhomboids 03/05/22 THERAPEUTIC EXERCISE: Instruction in initial HEP (see below) to improve flexibility, strength and mobility.  Verbal and tactile cues throughout for technique.    PATIENT EDUCATION:  Education details: PT eval findings, anticipated POC, initial HEP, and postural awareness Person educated: Patient Education method: Explanation, Demonstration, and Handouts Education comprehension: verbalized understanding, returned demonstration, and needs further education  HOME EXERCISE PROGRAM: Access Code: [email or web address] URL: [email or web address] Date: 03/10/2022 Prepared by: Clarene Essex  Exercises - Seated Cervical  Retraction  - 2 x daily - 7 x weekly - 2 sets - 10 reps - 3-5 sec hold - Seated Scapular Retraction  - 2 x daily - 7 x weekly - 2 sets - 10 reps - 3-5 sec hold - Seated Scapular Retraction with External Rotation  - 2 x daily - 7 x weekly - 2 sets - 10 reps - 3 sec hold - Standing Shoulder Row with Anchored Resistance  - 1 x daily - 7 x weekly - 3 sets - 10 reps - Shoulder Extension with Anchored Resistance  - 1 x daily - 7 x weekly - 3 sets - 10 reps   ASSESSMENT:  CLINICAL IMPRESSION: Pt show good response to treatment with no increased pain. Progress postural strengthening to improve alignment of spine and shoulder to reduce strain. She reported having soreness after doing HEP yesterday, which I assured her is normal. Added rows and ext with TB to HEP. Provided tactile and verbal cues for scapular retraction with exercises. Lots of tension found in UT and levator with MT.  OBJECTIVE IMPAIRMENTS: decreased activity tolerance, decreased knowledge of condition, decreased mobility, difficulty walking, decreased ROM, decreased strength, hypomobility, increased fascial restrictions, impaired perceived functional ability, increased muscle spasms, impaired flexibility, impaired UE functional use, improper body mechanics, postural dysfunction, and pain.   ACTIVITY LIMITATIONS: carrying, lifting, bending, sitting, standing, squatting, sleeping, reach over head, hygiene/grooming, and caring for others  PARTICIPATION LIMITATIONS: meal prep, cleaning, laundry, driving, shopping, and community activity  PERSONAL FACTORS: Age, Past/current experiences, Time since onset of injury/illness/exacerbation, and 3+ comorbidities: mild scoliosis; OA - R knee, hands; lumbar DDD; anemia; a-fib; aortic valve disease; HTN; ESRD - HD MWF; hearing loss; anxiety; depression; chronic headaches; insomnia; fatigue  are also affecting patient's functional outcome.   REHAB POTENTIAL: Good  CLINICAL DECISION MAKING:  Evolving/moderate complexity  EVALUATION COMPLEXITY: Moderate   GOALS: Goals reviewed with patient? Yes  SHORT TERM GOALS: Target date: 04/02/2022  Patient will be independent with initial HEP to improve outcomes and carryover.  Baseline:  Goal status: IN PROGRESS  2.  Patient will be able to verbalize and demonstrate proper posture with sitting/standing to decrease/reduce muscle tightness/reinjury .  Baseline:  Goal status: IN PROGRESS  LONG TERM GOALS: Target date: 04/30/2022  Patient will be independent with ongoing/advanced HEP for self-management at home.  Baseline:  Goal status: IN PROGRESS  2.  Patient will report 75% improvement in back and periscapular pain pain to improve QOL.  Baseline:  Goal status: IN PROGRESS  3.  Patient to demonstrate ability to achieve and maintain good spinal alignment/posturing and body mechanics needed for daily activities. Baseline:  Goal status: IN PROGRESS  4.  Patient will demonstrate functional pain free cervical and lumbar ROM to perform ADLs.   Baseline:  refer to above ROM tables Goal status: IN PROGRESS  5.  Patient will demonstrate improved proximal B  LE strength to >/= 4+/5 for improved stability and ease of mobility . Baseline: refer to above LE MMT table Goal status: IN PROGRESS  6.  Patient will demonstrate improved B shoulder and scapular strength to >/= 4+/5 for functional UE use w/o pain interference.  Baseline: refer to above UE MMT table Goal status: IN PROGRESS   7. Patient will report </= 21% on NDI to demonstrate improved functional ability with decreased pain interference. Baseline: 18 / 50 = 36.0 % Goal status: IN PROGRESS  8.  Patient will tolerate 3 hours of sitting w/o increased pain to allow for improved activity tolerance during HD treatments. Baseline:  Goal status: IN PROGRESS  9.  Patient will report </= 25% sleep disturbance due to back or periscapular pain. Baseline: sleep disturbed for up to 3-5  hrs per NDI Goal status: IN PROGRESS    PLAN:  PT FREQUENCY: 2x/week  PT DURATION: 8 weeks  PLANNED INTERVENTIONS: Therapeutic exercises, Therapeutic activity, Neuromuscular re-education, Balance training, Gait training, Patient/Family education, Self Care, Joint mobilization, Aquatic Therapy, Dry Needling, Electrical stimulation, Spinal manipulation, Cryotherapy, Moist heat, Taping, Ultrasound, Manual therapy, and Re-evaluation  PLAN FOR NEXT SESSION: Review initial HEP; postural education; progress postural and lumbopelvic flexibility/stretching and strengthening; MT +/- DN to address abnormal muscle tension in periscapular and paraspinal muscles   Armani Brar L Foster Sonnier, PTA 03/10/2022, 6:10 PM

## 2022-03-11 DIAGNOSIS — R69 Illness, unspecified: Secondary | ICD-10-CM | POA: Diagnosis not present

## 2022-03-11 DIAGNOSIS — D509 Iron deficiency anemia, unspecified: Secondary | ICD-10-CM | POA: Diagnosis not present

## 2022-03-11 DIAGNOSIS — N186 End stage renal disease: Secondary | ICD-10-CM | POA: Diagnosis not present

## 2022-03-11 DIAGNOSIS — D631 Anemia in chronic kidney disease: Secondary | ICD-10-CM | POA: Diagnosis not present

## 2022-03-11 DIAGNOSIS — N2581 Secondary hyperparathyroidism of renal origin: Secondary | ICD-10-CM | POA: Diagnosis not present

## 2022-03-11 DIAGNOSIS — Z114 Encounter for screening for human immunodeficiency virus [HIV]: Secondary | ICD-10-CM | POA: Diagnosis not present

## 2022-03-11 DIAGNOSIS — Z1159 Encounter for screening for other viral diseases: Secondary | ICD-10-CM | POA: Diagnosis not present

## 2022-03-13 DIAGNOSIS — N186 End stage renal disease: Secondary | ICD-10-CM | POA: Diagnosis not present

## 2022-03-13 DIAGNOSIS — D631 Anemia in chronic kidney disease: Secondary | ICD-10-CM | POA: Diagnosis not present

## 2022-03-13 DIAGNOSIS — D509 Iron deficiency anemia, unspecified: Secondary | ICD-10-CM | POA: Diagnosis not present

## 2022-03-13 DIAGNOSIS — N2581 Secondary hyperparathyroidism of renal origin: Secondary | ICD-10-CM | POA: Diagnosis not present

## 2022-03-16 DIAGNOSIS — N2581 Secondary hyperparathyroidism of renal origin: Secondary | ICD-10-CM | POA: Diagnosis not present

## 2022-03-16 DIAGNOSIS — N186 End stage renal disease: Secondary | ICD-10-CM | POA: Diagnosis not present

## 2022-03-16 DIAGNOSIS — D509 Iron deficiency anemia, unspecified: Secondary | ICD-10-CM | POA: Diagnosis not present

## 2022-03-16 DIAGNOSIS — D631 Anemia in chronic kidney disease: Secondary | ICD-10-CM | POA: Diagnosis not present

## 2022-03-16 DIAGNOSIS — Z7901 Long term (current) use of anticoagulants: Secondary | ICD-10-CM | POA: Diagnosis not present

## 2022-03-18 DIAGNOSIS — N2581 Secondary hyperparathyroidism of renal origin: Secondary | ICD-10-CM | POA: Diagnosis not present

## 2022-03-18 DIAGNOSIS — D509 Iron deficiency anemia, unspecified: Secondary | ICD-10-CM | POA: Diagnosis not present

## 2022-03-18 DIAGNOSIS — N186 End stage renal disease: Secondary | ICD-10-CM | POA: Diagnosis not present

## 2022-03-18 DIAGNOSIS — D631 Anemia in chronic kidney disease: Secondary | ICD-10-CM | POA: Diagnosis not present

## 2022-03-20 DIAGNOSIS — N186 End stage renal disease: Secondary | ICD-10-CM | POA: Diagnosis not present

## 2022-03-20 DIAGNOSIS — D509 Iron deficiency anemia, unspecified: Secondary | ICD-10-CM | POA: Diagnosis not present

## 2022-03-20 DIAGNOSIS — N2581 Secondary hyperparathyroidism of renal origin: Secondary | ICD-10-CM | POA: Diagnosis not present

## 2022-03-20 DIAGNOSIS — D631 Anemia in chronic kidney disease: Secondary | ICD-10-CM | POA: Diagnosis not present

## 2022-03-23 DIAGNOSIS — Z7901 Long term (current) use of anticoagulants: Secondary | ICD-10-CM | POA: Diagnosis not present

## 2022-03-23 DIAGNOSIS — D509 Iron deficiency anemia, unspecified: Secondary | ICD-10-CM | POA: Diagnosis not present

## 2022-03-23 DIAGNOSIS — D631 Anemia in chronic kidney disease: Secondary | ICD-10-CM | POA: Diagnosis not present

## 2022-03-23 DIAGNOSIS — N2581 Secondary hyperparathyroidism of renal origin: Secondary | ICD-10-CM | POA: Diagnosis not present

## 2022-03-23 DIAGNOSIS — N186 End stage renal disease: Secondary | ICD-10-CM | POA: Diagnosis not present

## 2022-03-24 ENCOUNTER — Ambulatory Visit: Payer: Medicare HMO

## 2022-03-24 DIAGNOSIS — R293 Abnormal posture: Secondary | ICD-10-CM

## 2022-03-24 DIAGNOSIS — M5459 Other low back pain: Secondary | ICD-10-CM | POA: Diagnosis not present

## 2022-03-24 DIAGNOSIS — M546 Pain in thoracic spine: Secondary | ICD-10-CM | POA: Diagnosis not present

## 2022-03-24 DIAGNOSIS — M62830 Muscle spasm of back: Secondary | ICD-10-CM

## 2022-03-24 DIAGNOSIS — M549 Dorsalgia, unspecified: Secondary | ICD-10-CM | POA: Diagnosis not present

## 2022-03-24 DIAGNOSIS — M6281 Muscle weakness (generalized): Secondary | ICD-10-CM | POA: Diagnosis not present

## 2022-03-24 NOTE — Therapy (Signed)
OUTPATIENT PHYSICAL THERAPY TREATMENT   Patient Name: Jillian Hayes MRN: [id number] DOB:August 15, 1945, 77 y.o., female Today's Date: 03/24/2022  END OF SESSION:  PT End of Session - 03/24/22 1536     Visit Number 3    Date for PT Re-Evaluation 04/30/22    Authorization Type [employer]    Progress Note Due on Visit 10    PT Start Time 1447    PT Stop Time 1528    PT Time Calculation (min) 41 min    Activity Tolerance Patient tolerated treatment well    Behavior During Therapy [facility] for tasks assessed/performed               Past Medical History:  Diagnosis Date   Advanced care planning/counseling discussion 06/10/2014   05/31/2014 patient presents copy of HCP and Living Will   Anemia    iron deficiency   Anxiety    Arthritis    "in hands"   Atrial fibrillation ([place])    Benign fundic gland polyps of stomach    Bladder polyps 06/25/2010   Chronic headaches [id number]   Complication of anesthesia    "woke up at the end of a cyst removal surgery in 1991"   Depression 1991   hospitalized   Diverticulosis    ESRD (end stage renal disease) ([place]) 11/09/2015   HD on MWF   External prolapsed hemorrhoids    History of chicken pox 06/25/2010   Hypercalcemia 02/18/2014   Hypertension    Insomnia 06/24/2010   Multiple chemical sensitivity syndrome 06/25/2010   Proteinuria 02/18/2014   Renal insufficiency 03/26/2011   Valvular heart disease 04/28/2016   Past Surgical History:  Procedure Laterality Date   AV FISTULA PLACEMENT Left    x4   COLONOSCOPY  2014   cyst on left breast removed Left 1991   benign   ESOPHAGOGASTRODUODENOSCOPY (EGD) WITH ESOPHAGEAL DILATION  2014   LAPAROSCOPIC BILATERAL SALPINGO OOPHERECTOMY Bilateral 08/20/2020   Procedure: LAPAROSCOPIC BILATERAL SALPINGO OOPHORECTOMY;  Surgeon: Megan Salon, MD;  Location: [place];  Service: Gynecology;  Laterality: Bilateral;   NASAL SEPTUM SURGERY  1986   rhinoplasty   polyps on bladder removed  1972    benign   RIGHT HEART CATH N/A 04/10/2021   Procedure: RIGHT HEART CATH;  Surgeon: Larey Dresser, MD;  Location: [facility];  Service: Cardiovascular;  Laterality: N/A;   TONSILLECTOMY  1962   Patient Active Problem List   Diagnosis Date Noted   Pulmonary hypertension, primary ([place]) 03/09/2021   Low TSH level 03/09/2021   Right knee pain 03/06/2021   Primary osteoarthritis of right knee 02/25/2021   Adnexal mass    A-V fistula ([place]) 08/03/2019   Nonrheumatic mitral valve regurgitation 06/12/2019   Edema 04/09/2019   Drug-induced constipation 10/18/2018   External prolapsed hemorrhoids 09/25/2018   Hyperphosphatemia 03/09/2018   Leg swelling 03/03/2018   Long term (current) use of anticoagulants 11/01/2017   Atrial fibrillation ([place]) 10/25/2017   Hyperparathyroidism, unspecified ([place]) 05/04/2017   DDD (degenerative disc disease), lumbar 04/07/2017   Hearing loss 01/04/2017   Hyperlipidemia 01/04/2017   Arthritis of fingers of both hands 10/01/2016   Chronic rhinitis 10/01/2016   Nonrheumatic aortic valve insufficiency 09/29/2016   Bruit 09/29/2016   Palpitation 09/22/2016   Aortic valve disease 04/28/2016   Anemia in ESRD (end-stage renal disease) ([place]) 03/03/2016   Sun-damaged skin 01/21/2016   ESRD (end stage renal disease) ([place]) 11/09/2015   Advanced care planning/counseling discussion 06/10/2014  Pedal edema 03/28/2014   Proteinuria 02/18/2014   Hypercalcemia 02/18/2014   Abnormal EKG 01/22/2014   HTN (hypertension) 01/22/2014   Dermatitis 06/30/2013   Abnormal thyroid function test 06/25/2013   Cervical cancer screening 06/10/2012   Preventative health care 11/09/2011   Multiple chemical sensitivity syndrome 06/25/2010   History of chicken pox 06/25/2010   Bladder polyps 06/25/2010   History of cardiac dysrhythmia 06/25/2010   Insomnia 06/24/2010   Fatigue 06/24/2010   Chronic headaches 06/24/2010   Allergy    Anemia    Depression     PCP: [name], MD   REFERRING PROVIDER: Terrilyn Saver, NP   REFERRING DIAG: M54.9 (ICD-10-CM) - Upper back pain   THERAPY DIAG:  Pain in thoracic spine  Abnormal posture  Muscle spasm of back  Other low back pain  Muscle weakness (generalized)  RATIONALE FOR EVALUATION AND TREATMENT: Rehabilitation  ONSET DATE: late Nov 2023  NEXT MD VISIT: 05/05/22 with PCP   SUBJECTIVE:                                                                                                                                                                                                         SUBJECTIVE STATEMENT: Pt reports having best noight sleep next 2 night  PAIN: Are you having pain? Yes: NPRS scale: 3/10 Pain location: L>R scapula and medial periscapular muscles Pain description: constant dull ache, stabbing at times Aggravating factors: meal prep, driving, getting clothes out of washer/dryer, sweeping, anything that involves lifting, prolonged sitting during HD (3-4 hrs) Relieving factors: laying down and resting used to help but no longer, ice & Tylenol take the edge off but pain returns w/in an hour, massage or tennis release  PERTINENT HISTORY:  mild scoliosis; OA - R knee, hands; lumbar DDD; anemia; a-fib; aortic valve disease; HTN; ESRD - HD MWF; hearing loss; anxiety; depression; chronic headaches; insomnia; fatigue  PRECAUTIONS: Other: HD MWF - fistula in L upper arm  WEIGHT BEARING RESTRICTIONS: No  FALLS:  Has patient fallen in last 6 months? No  LIVING ENVIRONMENT: Lives with: lives with their spouse and lives with their daughter Lives in: House/apartment Stairs: Yes: Internal: 14 steps; on right going up (dtr lives upstairs, she rarely goes up) Has following equipment at home: shower chair  OCCUPATION: Retired  PLOF: Independent and Leisure: reading on Kindle, walking when she has the energy ~2x/wk or when shopping  PATIENT GOALS: "To be able to sleep w/o back hurting. To  be able to sit through HD and do things around  the house w/o pain."   OBJECTIVE:   DIAGNOSTIC FINDINGS:  03/03/22 - Thoracic spine x-ray: IMPRESSION: 1. No acute fracture. 2. Mild degenerative changes in the thoracic spine.  PATIENT SURVEYS:  NDI 18 / 50 = 36.0 %  COGNITION:  Overall cognitive status: Within functional limits for tasks assessed    SENSATION: WFL  MUSCLE LENGTH: Hamstrings: mild/mod tight L>R ITB: mod tight R Piriformis: mod tight R Hip flexors: mod tight R, mild tight L Quads: mod tight R, mild tight L Heelcord: NT  POSTURE:  rounded shoulders, forward head, increased thoracic kyphosis, and mils scoliosis  PALPATION: TTP with increased muscle tension and taut bands in R>L thoracolumbar paraspinals, L >R cervicothoracic paraspinals and periscapular muscles   CERVICAL ROM:   Active ROM AROM (deg) eval  Flexion 56  Extension 24  Right lateral flexion 38  Left lateral flexion 20  Right rotation 56  Left rotation 71   (Blank rows = not tested)  UPPER EXTREMITY ROM:  Active ROM Right eval Left eval  Shoulder flexion WNL WNL  Shoulder extension 48 40  Shoulder abduction 170 144  Shoulder adduction    Shoulder internal rotation FIR - WNL FIR - WNL  Shoulder external rotation FER - WNL FER - WNL  Elbow flexion    Elbow extension    Wrist flexion    Wrist extension    Wrist ulnar deviation    Wrist radial deviation    Wrist pronation    Wrist supination     (Blank rows = not tested)   LUMBAR ROM:   Active  A/PROM  eval  Flexion Hands to ankles  Extension 50% limited  Right lateral flexion Hand to lateral knee  Left lateral flexion Hand to lateral knee  Right rotation 50% limited  Left rotation 50% limited  (Blank rows = not tested)  LOWER EXTREMITY ROM:    Grossly WFL/WNL  UPPER EXTREMITY MMT:  MMT Right eval Left eval  Shoulder flexion 4+ 4- *  Shoulder extension 4+ 4  Shoulder abduction 4 * 4- *  Shoulder adduction     Shoulder internal rotation 4+ 4  Shoulder external rotation 4+ 4-  Middle trapezius 4 4-  Lower trapezius    Elbow flexion    Elbow extension    Wrist flexion    Wrist extension    Wrist ulnar deviation    Wrist radial deviation    Wrist pronation    Wrist supination    Grip strength     (Blank rows = not tested)  LOWER EXTREMITY MMT:    MMT Right eval Left eval  Hip flexion 4- 4-  Hip extension unable unable  Hip abduction 4- 4-  Hip adduction 4 4-  Hip internal rotation 4 4-  Hip external rotation 4- 4-  Knee flexion 5 5  Knee extension 5 5  Ankle dorsiflexion 4- 4-  Ankle plantarflexion    Ankle inversion    Ankle eversion     (Blank rows = not tested)   TODAY'S TREATMENT:  03/24/22 THERAPEUTIC EXERCISE: Instruction in initial HEP (see below) to improve flexibility, strength and mobility.  Verbal and tactile cues throughout for technique.  UBE L1x6 min Standing row YTB 2x10 Standing shoulder ext YTB x 10 ER with YTB standing in doorway x 10 Horiz ABD YTB standing in doorway x 10 Seated thoracic hugs x 10 Wall push up with a plus x 10 Lower trap set with arm raise x 10 Manual Therapy: to  decrease muscle spasm, pain and improve mobility.  STM to L UT, LS, rhomboids  03/10/22 THERAPEUTIC EXERCISE: Instruction in initial HEP (see below) to improve flexibility, strength and mobility.  Verbal and tactile cues throughout for technique.  UBE L1x6 min Seated chin tuck x 10  Seated scap retraction 10x5" Seated ER with scap retraction x 10  Standing horizontal abd x 10 Standing rows YTB x 10  Standing shld ext YTB x 10   Manual Therapy: to decrease muscle spasm, pain and improve mobility.  STM to L UT, LS, rhomboids 03/05/22 THERAPEUTIC EXERCISE: Instruction in initial HEP (see below) to improve flexibility, strength and mobility.  Verbal and tactile cues throughout for technique.    PATIENT EDUCATION:  Education details: PT eval findings, anticipated POC,  initial HEP, and postural awareness Person educated: Patient Education method: Explanation, Demonstration, and Handouts Education comprehension: verbalized understanding, returned demonstration, and needs further education  HOME EXERCISE PROGRAM: Access Code: [email or web address] URL: [email or web address] Date: 03/10/2022 Prepared by: Clarene Essex  Exercises - Seated Cervical Retraction  - 2 x daily - 7 x weekly - 2 sets - 10 reps - 3-5 sec hold - Seated Scapular Retraction  - 2 x daily - 7 x weekly - 2 sets - 10 reps - 3-5 sec hold - Seated Scapular Retraction with External Rotation  - 2 x daily - 7 x weekly - 2 sets - 10 reps - 3 sec hold - Standing Shoulder Row with Anchored Resistance  - 1 x daily - 7 x weekly - 3 sets - 10 reps - Shoulder Extension with Anchored Resistance  - 1 x daily - 7 x weekly - 3 sets - 10 reps   ASSESSMENT:  CLINICAL IMPRESSION: Pt noted relief for two days after previous treatment. Continued to advance with posterior shoulder strength focusing on postural alignment. Pt was fatigued in both shoulders after TB exercises. She continues to have tension in her UT and levator area, which was addressed with manual therapy today.   OBJECTIVE IMPAIRMENTS: decreased activity tolerance, decreased knowledge of condition, decreased mobility, difficulty walking, decreased ROM, decreased strength, hypomobility, increased fascial restrictions, impaired perceived functional ability, increased muscle spasms, impaired flexibility, impaired UE functional use, improper body mechanics, postural dysfunction, and pain.   ACTIVITY LIMITATIONS: carrying, lifting, bending, sitting, standing, squatting, sleeping, reach over head, hygiene/grooming, and caring for others  PARTICIPATION LIMITATIONS: meal prep, cleaning, laundry, driving, shopping, and community activity  PERSONAL FACTORS: Age, Past/current experiences, Time since onset of injury/illness/exacerbation, and 3+  comorbidities: mild scoliosis; OA - R knee, hands; lumbar DDD; anemia; a-fib; aortic valve disease; HTN; ESRD - HD MWF; hearing loss; anxiety; depression; chronic headaches; insomnia; fatigue  are also affecting patient's functional outcome.   REHAB POTENTIAL: Good  CLINICAL DECISION MAKING: Evolving/moderate complexity  EVALUATION COMPLEXITY: Moderate   GOALS: Goals reviewed with patient? Yes  SHORT TERM GOALS: Target date: 04/02/2022  Patient will be independent with initial HEP to improve outcomes and carryover.  Baseline:  Goal status: MET - 03/24/22  2.  Patient will be able to verbalize and demonstrate proper posture with sitting/standing to decrease/reduce muscle tightness/reinjury .  Baseline:  Goal status: IN PROGRESS  LONG TERM GOALS: Target date: 04/30/2022  Patient will be independent with ongoing/advanced HEP for self-management at home.  Baseline:  Goal status: IN PROGRESS  2.  Patient will report 75% improvement in back and periscapular pain pain to improve QOL.  Baseline:  Goal status: IN PROGRESS  3.  Patient to demonstrate ability  to achieve and maintain good spinal alignment/posturing and body mechanics needed for daily activities. Baseline:  Goal status: IN PROGRESS  4.  Patient will demonstrate functional pain free cervical and lumbar ROM to perform ADLs.   Baseline:  refer to above ROM tables Goal status: IN PROGRESS  5.  Patient will demonstrate improved proximal B LE strength to >/= 4+/5 for improved stability and ease of mobility . Baseline: refer to above LE MMT table Goal status: IN PROGRESS  6.  Patient will demonstrate improved B shoulder and scapular strength to >/= 4+/5 for functional UE use w/o pain interference.  Baseline: refer to above UE MMT table Goal status: IN PROGRESS   7. Patient will report </= 21% on NDI to demonstrate improved functional ability with decreased pain interference. Baseline: 18 / 50 = 36.0 % Goal status: IN  PROGRESS  8.  Patient will tolerate 3 hours of sitting w/o increased pain to allow for improved activity tolerance during HD treatments. Baseline:  Goal status: IN PROGRESS  9.  Patient will report </= 25% sleep disturbance due to back or periscapular pain. Baseline: sleep disturbed for up to 3-5 hrs per NDI Goal status: IN PROGRESS    PLAN:  PT FREQUENCY: 2x/week  PT DURATION: 8 weeks  PLANNED INTERVENTIONS: Therapeutic exercises, Therapeutic activity, Neuromuscular re-education, Balance training, Gait training, Patient/Family education, Self Care, Joint mobilization, Aquatic Therapy, Dry Needling, Electrical stimulation, Spinal manipulation, Cryotherapy, Moist heat, Taping, Ultrasound, Manual therapy, and Re-evaluation  PLAN FOR NEXT SESSION: Review initial HEP; postural education; progress postural and lumbopelvic flexibility/stretching and strengthening; MT +/- DN to address abnormal muscle tension in periscapular and paraspinal muscles   Tena Linebaugh L Ayvion Kavanagh, PTA 03/24/2022, 3:36 PM

## 2022-03-25 DIAGNOSIS — N2581 Secondary hyperparathyroidism of renal origin: Secondary | ICD-10-CM | POA: Diagnosis not present

## 2022-03-25 DIAGNOSIS — N186 End stage renal disease: Secondary | ICD-10-CM | POA: Diagnosis not present

## 2022-03-25 DIAGNOSIS — D631 Anemia in chronic kidney disease: Secondary | ICD-10-CM | POA: Diagnosis not present

## 2022-03-25 DIAGNOSIS — D509 Iron deficiency anemia, unspecified: Secondary | ICD-10-CM | POA: Diagnosis not present

## 2022-03-26 ENCOUNTER — Ambulatory Visit: Payer: Medicare HMO

## 2022-03-26 DIAGNOSIS — M549 Dorsalgia, unspecified: Secondary | ICD-10-CM | POA: Diagnosis not present

## 2022-03-26 DIAGNOSIS — M5459 Other low back pain: Secondary | ICD-10-CM | POA: Diagnosis not present

## 2022-03-26 DIAGNOSIS — M62830 Muscle spasm of back: Secondary | ICD-10-CM

## 2022-03-26 DIAGNOSIS — R293 Abnormal posture: Secondary | ICD-10-CM | POA: Diagnosis not present

## 2022-03-26 DIAGNOSIS — M546 Pain in thoracic spine: Secondary | ICD-10-CM | POA: Diagnosis not present

## 2022-03-26 DIAGNOSIS — M6281 Muscle weakness (generalized): Secondary | ICD-10-CM | POA: Diagnosis not present

## 2022-03-26 NOTE — Therapy (Signed)
OUTPATIENT PHYSICAL THERAPY TREATMENT   Patient Name: Jillian Hayes MRN: [id number] DOB:1945-11-28, 77 y.o., female Today's Date: 03/26/2022  END OF SESSION:  PT End of Session - 03/26/22 1533     Visit Number 4    Date for PT Re-Evaluation 04/30/22    Authorization Type [employer]    Progress Note Due on Visit 10    PT Start Time 1446    PT Stop Time 1530    PT Time Calculation (min) 44 min    Activity Tolerance Patient tolerated treatment well    Behavior During Therapy [facility] for tasks assessed/performed                Past Medical History:  Diagnosis Date   Advanced care planning/counseling discussion 06/10/2014   05/31/2014 patient presents copy of HCP and Living Will   Anemia    iron deficiency   Anxiety    Arthritis    "in hands"   Atrial fibrillation ([place])    Benign fundic gland polyps of stomach    Bladder polyps 06/25/2010   Chronic headaches [id number]   Complication of anesthesia    "woke up at the end of a cyst removal surgery in 1991"   Depression 1991   hospitalized   Diverticulosis    ESRD (end stage renal disease) ([place]) 11/09/2015   HD on MWF   External prolapsed hemorrhoids    History of chicken pox 06/25/2010   Hypercalcemia 02/18/2014   Hypertension    Insomnia 06/24/2010   Multiple chemical sensitivity syndrome 06/25/2010   Proteinuria 02/18/2014   Renal insufficiency 03/26/2011   Valvular heart disease 04/28/2016   Past Surgical History:  Procedure Laterality Date   AV FISTULA PLACEMENT Left    x4   COLONOSCOPY  2014   cyst on left breast removed Left 1991   benign   ESOPHAGOGASTRODUODENOSCOPY (EGD) WITH ESOPHAGEAL DILATION  2014   LAPAROSCOPIC BILATERAL SALPINGO OOPHERECTOMY Bilateral 08/20/2020   Procedure: LAPAROSCOPIC BILATERAL SALPINGO OOPHORECTOMY;  Surgeon: Megan Salon, MD;  Location: [facility];  Service: Gynecology;  Laterality: Bilateral;   NASAL SEPTUM SURGERY  1986   rhinoplasty   polyps on bladder removed  1972    benign   RIGHT HEART CATH N/A 04/10/2021   Procedure: RIGHT HEART CATH;  Surgeon: Larey Dresser, MD;  Location: [facility];  Service: Cardiovascular;  Laterality: N/A;   TONSILLECTOMY  1962   Patient Active Problem List   Diagnosis Date Noted   Pulmonary hypertension, primary ([place]) 03/09/2021   Low TSH level 03/09/2021   Right knee pain 03/06/2021   Primary osteoarthritis of right knee 02/25/2021   Adnexal mass    A-V fistula ([place]) 08/03/2019   Nonrheumatic mitral valve regurgitation 06/12/2019   Edema 04/09/2019   Drug-induced constipation 10/18/2018   External prolapsed hemorrhoids 09/25/2018   Hyperphosphatemia 03/09/2018   Leg swelling 03/03/2018   Long term (current) use of anticoagulants 11/01/2017   Atrial fibrillation ([place]) 10/25/2017   Hyperparathyroidism, unspecified ([place]) 05/04/2017   DDD (degenerative disc disease), lumbar 04/07/2017   Hearing loss 01/04/2017   Hyperlipidemia 01/04/2017   Arthritis of fingers of both hands 10/01/2016   Chronic rhinitis 10/01/2016   Nonrheumatic aortic valve insufficiency 09/29/2016   Bruit 09/29/2016   Palpitation 09/22/2016   Aortic valve disease 04/28/2016   Anemia in ESRD (end-stage renal disease) ([place]) 03/03/2016   Sun-damaged skin 01/21/2016   ESRD (end stage renal disease) ([place]) 11/09/2015   Advanced care planning/counseling discussion 06/10/2014  Pedal edema 03/28/2014   Proteinuria 02/18/2014   Hypercalcemia 02/18/2014   Abnormal EKG 01/22/2014   HTN (hypertension) 01/22/2014   Dermatitis 06/30/2013   Abnormal thyroid function test 06/25/2013   Cervical cancer screening 06/10/2012   Preventative health care 11/09/2011   Multiple chemical sensitivity syndrome 06/25/2010   History of chicken pox 06/25/2010   Bladder polyps 06/25/2010   History of cardiac dysrhythmia 06/25/2010   Insomnia 06/24/2010   Fatigue 06/24/2010   Chronic headaches 06/24/2010   Allergy    Anemia    Depression     PCP:  [name], MD   REFERRING PROVIDER: Terrilyn Saver, NP   REFERRING DIAG: M54.9 (ICD-10-CM) - Upper back pain   THERAPY DIAG:  Pain in thoracic spine  Abnormal posture  Muscle spasm of back  Other low back pain  Muscle weakness (generalized)  RATIONALE FOR EVALUATION AND TREATMENT: Rehabilitation  ONSET DATE: late Nov 2023  NEXT MD VISIT: 05/05/22 with PCP   SUBJECTIVE:                                                                                                                                                                                                         SUBJECTIVE STATEMENT: Pt reports sleeping good the past two nights, having no pain today.  PAIN: Are you having pain? No  PERTINENT HISTORY:  mild scoliosis; OA - R knee, hands; lumbar DDD; anemia; a-fib; aortic valve disease; HTN; ESRD - HD MWF; hearing loss; anxiety; depression; chronic headaches; insomnia; fatigue  PRECAUTIONS: Other: HD MWF - fistula in L upper arm  WEIGHT BEARING RESTRICTIONS: No  FALLS:  Has patient fallen in last 6 months? No  LIVING ENVIRONMENT: Lives with: lives with their spouse and lives with their daughter Lives in: House/apartment Stairs: Yes: Internal: 14 steps; on right going up (dtr lives upstairs, she rarely goes up) Has following equipment at home: shower chair  OCCUPATION: Retired  PLOF: Independent and Leisure: reading on Kindle, walking when she has the energy ~2x/wk or when shopping  PATIENT GOALS: "To be able to sleep w/o back hurting. To be able to sit through HD and do things around the house w/o pain."   OBJECTIVE:   DIAGNOSTIC FINDINGS:  03/03/22 - Thoracic spine x-ray: IMPRESSION: 1. No acute fracture. 2. Mild degenerative changes in the thoracic spine.  PATIENT SURVEYS:  NDI 18 / 50 = 36.0 %  COGNITION:  Overall cognitive status: Within functional limits for tasks assessed    SENSATION: WFL  MUSCLE LENGTH: Hamstrings: mild/mod tight  L>R ITB:  mod tight R Piriformis: mod tight R Hip flexors: mod tight R, mild tight L Quads: mod tight R, mild tight L Heelcord: NT  POSTURE:  rounded shoulders, forward head, increased thoracic kyphosis, and mils scoliosis  PALPATION: TTP with increased muscle tension and taut bands in R>L thoracolumbar paraspinals, L >R cervicothoracic paraspinals and periscapular muscles   CERVICAL ROM:   Active ROM AROM (deg) eval  Flexion 56  Extension 24  Right lateral flexion 38  Left lateral flexion 20  Right rotation 56  Left rotation 71   (Blank rows = not tested)  UPPER EXTREMITY ROM:  Active ROM Right eval Left eval  Shoulder flexion WNL WNL  Shoulder extension 48 40  Shoulder abduction 170 144  Shoulder adduction    Shoulder internal rotation FIR - WNL FIR - WNL  Shoulder external rotation FER - WNL FER - WNL  Elbow flexion    Elbow extension    Wrist flexion    Wrist extension    Wrist ulnar deviation    Wrist radial deviation    Wrist pronation    Wrist supination     (Blank rows = not tested)   LUMBAR ROM:   Active  A/PROM  eval  Flexion Hands to ankles  Extension 50% limited  Right lateral flexion Hand to lateral knee  Left lateral flexion Hand to lateral knee  Right rotation 50% limited  Left rotation 50% limited  (Blank rows = not tested)  LOWER EXTREMITY ROM:    Grossly WFL/WNL  UPPER EXTREMITY MMT:  MMT Right eval Left eval  Shoulder flexion 4+ 4- *  Shoulder extension 4+ 4  Shoulder abduction 4 * 4- *  Shoulder adduction    Shoulder internal rotation 4+ 4  Shoulder external rotation 4+ 4-  Middle trapezius 4 4-  Lower trapezius    Elbow flexion    Elbow extension    Wrist flexion    Wrist extension    Wrist ulnar deviation    Wrist radial deviation    Wrist pronation    Wrist supination    Grip strength     (Blank rows = not tested)  LOWER EXTREMITY MMT:    MMT Right eval Left eval  Hip flexion 4- 4-  Hip extension unable  unable  Hip abduction 4- 4-  Hip adduction 4 4-  Hip internal rotation 4 4-  Hip external rotation 4- 4-  Knee flexion 5 5  Knee extension 5 5  Ankle dorsiflexion 4- 4-  Ankle plantarflexion    Ankle inversion    Ankle eversion     (Blank rows = not tested)   TODAY'S TREATMENT:  03/26/22 THERAPEUTIC EXERCISE: Instruction in initial HEP (see below) to improve flexibility, strength and mobility.  Verbal and tactile cues throughout for technique.  UBE L2x6 min ER YTB standing in doorframe x 10 Thoracic hugs standing against pool noodle x 10 Shoulder rolls standing against pool noodle x 10 Seated thoracic ext arms crossed 10x5" S/L open book 10x5" L   Review of posture and body mehcnics handout with patient  STM to L thoracic paraspinals and medial scapular border  03/24/22 THERAPEUTIC EXERCISE: Instruction in initial HEP (see below) to improve flexibility, strength and mobility.  Verbal and tactile cues throughout for technique.  UBE L1x6 min Standing row YTB 2x10 Standing shoulder ext YTB x 10 ER with YTB standing in doorway x 10 Horiz ABD YTB standing in doorway x 10 Seated thoracic hugs x 10 Wall push  up with a plus x 10 Lower trap set with arm raise x 10 Manual Therapy: to decrease muscle spasm, pain and improve mobility.  STM to L UT, LS, rhomboids  03/10/22 THERAPEUTIC EXERCISE: Instruction in initial HEP (see below) to improve flexibility, strength and mobility.  Verbal and tactile cues throughout for technique.  UBE L1x6 min Seated chin tuck x 10  Seated scap retraction 10x5" Seated ER with scap retraction x 10  Standing horizontal abd x 10 Standing rows YTB x 10  Standing shld ext YTB x 10   Manual Therapy: to decrease muscle spasm, pain and improve mobility.  STM to L UT, LS, rhomboids 03/05/22 THERAPEUTIC EXERCISE: Instruction in initial HEP (see below) to improve flexibility, strength and mobility.  Verbal and tactile cues throughout for technique.     PATIENT EDUCATION:  Education details: PT eval findings, anticipated POC, initial HEP, and postural awareness Person educated: Patient Education method: Explanation, Demonstration, and Handouts Education comprehension: verbalized understanding, returned demonstration, and needs further education  HOME EXERCISE PROGRAM: Access Code: [email or web address] URL: [email or web address] Date: 03/26/2022 Prepared by: Clarene Essex  Exercises - Seated Cervical Retraction  - 2 x daily - 7 x weekly - 2 sets - 10 reps - 3-5 sec hold - Seated Scapular Retraction  - 2 x daily - 7 x weekly - 2 sets - 10 reps - 3-5 sec hold - Seated Scapular Retraction with External Rotation  - 2 x daily - 7 x weekly - 2 sets - 10 reps - 3 sec hold - Standing Shoulder Row with Anchored Resistance  - 1 x daily - 7 x weekly - 3 sets - 10 reps - Shoulder Extension with Anchored Resistance  - 1 x daily - 7 x weekly - 3 sets - 10 reps - Thoracic Foam Roll Mobilization Hug  - 1 x daily - 7 x weekly - 2 sets - 10 reps - Sidelying Thoracic Rotation with Open Book  - 1 x daily - 7 x weekly - 2 sets - 10 reps - 5 sec hold   ASSESSMENT:  CLINICAL IMPRESSION: Anael was more fatigued today in shoulders from a busy day as well as increased resistance on UBE. Unable to much TB exercise without seeing much compensation. We worked more on thoracic mobility adding open book and thoracic hug to HEP. Reviewed posture and body mechanics handout with her to emphasize the importance of good posture to reduce strain on neck and shoulders.    OBJECTIVE IMPAIRMENTS: decreased activity tolerance, decreased knowledge of condition, decreased mobility, difficulty walking, decreased ROM, decreased strength, hypomobility, increased fascial restrictions, impaired perceived functional ability, increased muscle spasms, impaired flexibility, impaired UE functional use, improper body mechanics, postural dysfunction, and pain.   ACTIVITY LIMITATIONS:  carrying, lifting, bending, sitting, standing, squatting, sleeping, reach over head, hygiene/grooming, and caring for others  PARTICIPATION LIMITATIONS: meal prep, cleaning, laundry, driving, shopping, and community activity  PERSONAL FACTORS: Age, Past/current experiences, Time since onset of injury/illness/exacerbation, and 3+ comorbidities: mild scoliosis; OA - R knee, hands; lumbar DDD; anemia; a-fib; aortic valve disease; HTN; ESRD - HD MWF; hearing loss; anxiety; depression; chronic headaches; insomnia; fatigue  are also affecting patient's functional outcome.   REHAB POTENTIAL: Good  CLINICAL DECISION MAKING: Evolving/moderate complexity  EVALUATION COMPLEXITY: Moderate   GOALS: Goals reviewed with patient? Yes  SHORT TERM GOALS: Target date: 04/02/2022  Patient will be independent with initial HEP to improve outcomes and carryover.  Baseline:  Goal status: MET - 03/24/22  2.  Patient will be able to verbalize and demonstrate proper posture with sitting/standing to decrease/reduce muscle tightness/reinjury .  Baseline:  Goal status: IN PROGRESS  LONG TERM GOALS: Target date: 04/30/2022  Patient will be independent with ongoing/advanced HEP for self-management at home.  Baseline:  Goal status: IN PROGRESS  2.  Patient will report 75% improvement in back and periscapular pain pain to improve QOL.  Baseline:  Goal status: IN PROGRESS  3.  Patient to demonstrate ability to achieve and maintain good spinal alignment/posturing and body mechanics needed for daily activities. Baseline:  Goal status: IN PROGRESS  4.  Patient will demonstrate functional pain free cervical and lumbar ROM to perform ADLs.   Baseline:  refer to above ROM tables Goal status: IN PROGRESS  5.  Patient will demonstrate improved proximal B LE strength to >/= 4+/5 for improved stability and ease of mobility . Baseline: refer to above LE MMT table Goal status: IN PROGRESS  6.  Patient will demonstrate  improved B shoulder and scapular strength to >/= 4+/5 for functional UE use w/o pain interference.  Baseline: refer to above UE MMT table Goal status: IN PROGRESS   7. Patient will report </= 21% on NDI to demonstrate improved functional ability with decreased pain interference. Baseline: 18 / 50 = 36.0 % Goal status: IN PROGRESS  8.  Patient will tolerate 3 hours of sitting w/o increased pain to allow for improved activity tolerance during HD treatments. Baseline:  Goal status: IN PROGRESS  9.  Patient will report </= 25% sleep disturbance due to back or periscapular pain. Baseline: sleep disturbed for up to 3-5 hrs per NDI Goal status: IN PROGRESS    PLAN:  PT FREQUENCY: 2x/week  PT DURATION: 8 weeks  PLANNED INTERVENTIONS: Therapeutic exercises, Therapeutic activity, Neuromuscular re-education, Balance training, Gait training, Patient/Family education, Self Care, Joint mobilization, Aquatic Therapy, Dry Needling, Electrical stimulation, Spinal manipulation, Cryotherapy, Moist heat, Taping, Ultrasound, Manual therapy, and Re-evaluation  PLAN FOR NEXT SESSION: progress postural and lumbopelvic flexibility/stretching and strengthening; MT +/- DN to address abnormal muscle tension in periscapular and paraspinal muscles   Denney Shein L Carling Liberman, PTA 03/26/2022, 4:05 PM

## 2022-03-27 DIAGNOSIS — D509 Iron deficiency anemia, unspecified: Secondary | ICD-10-CM | POA: Diagnosis not present

## 2022-03-27 DIAGNOSIS — N186 End stage renal disease: Secondary | ICD-10-CM | POA: Diagnosis not present

## 2022-03-27 DIAGNOSIS — N2581 Secondary hyperparathyroidism of renal origin: Secondary | ICD-10-CM | POA: Diagnosis not present

## 2022-03-27 DIAGNOSIS — D631 Anemia in chronic kidney disease: Secondary | ICD-10-CM | POA: Diagnosis not present

## 2022-03-30 DIAGNOSIS — D509 Iron deficiency anemia, unspecified: Secondary | ICD-10-CM | POA: Diagnosis not present

## 2022-03-30 DIAGNOSIS — N2581 Secondary hyperparathyroidism of renal origin: Secondary | ICD-10-CM | POA: Diagnosis not present

## 2022-03-30 DIAGNOSIS — Z7901 Long term (current) use of anticoagulants: Secondary | ICD-10-CM | POA: Diagnosis not present

## 2022-03-30 DIAGNOSIS — D631 Anemia in chronic kidney disease: Secondary | ICD-10-CM | POA: Diagnosis not present

## 2022-03-30 DIAGNOSIS — N186 End stage renal disease: Secondary | ICD-10-CM | POA: Diagnosis not present

## 2022-03-30 NOTE — Therapy (Signed)
OUTPATIENT PHYSICAL THERAPY TREATMENT   Patient Name: Jillian Hayes MRN: [id number] DOB:1945/10/03, 77 y.o., female Today's Date: 03/31/2022  END OF SESSION:  PT End of Session - 03/31/22 1352     Visit Number 5    Date for PT Re-Evaluation 04/30/22    Authorization Type [employer]    Progress Note Due on Visit 10    PT Start Time 1350    PT Stop Time 1430    PT Time Calculation (min) 40 min    Activity Tolerance Patient tolerated treatment well    Behavior During Therapy [facility] for tasks assessed/performed                Past Medical History:  Diagnosis Date   Advanced care planning/counseling discussion 06/10/2014   05/31/2014 patient presents copy of HCP and Living Will   Anemia    iron deficiency   Anxiety    Arthritis    "in hands"   Atrial fibrillation ([place])    Benign fundic gland polyps of stomach    Bladder polyps 06/25/2010   Chronic headaches [id number]   Complication of anesthesia    "woke up at the end of a cyst removal surgery in 1991"   Depression 1991   hospitalized   Diverticulosis    ESRD (end stage renal disease) ([place]) 11/09/2015   HD on MWF   External prolapsed hemorrhoids    History of chicken pox 06/25/2010   Hypercalcemia 02/18/2014   Hypertension    Insomnia 06/24/2010   Multiple chemical sensitivity syndrome 06/25/2010   Proteinuria 02/18/2014   Renal insufficiency 03/26/2011   Valvular heart disease 04/28/2016   Past Surgical History:  Procedure Laterality Date   AV FISTULA PLACEMENT Left    x4   COLONOSCOPY  2014   cyst on left breast removed Left 1991   benign   ESOPHAGOGASTRODUODENOSCOPY (EGD) WITH ESOPHAGEAL DILATION  2014   LAPAROSCOPIC BILATERAL SALPINGO OOPHERECTOMY Bilateral 08/20/2020   Procedure: LAPAROSCOPIC BILATERAL SALPINGO OOPHORECTOMY;  Surgeon: Megan Salon, MD;  Location: [facility];  Service: Gynecology;  Laterality: Bilateral;   NASAL SEPTUM SURGERY  1986   rhinoplasty   polyps on bladder removed  1972    benign   RIGHT HEART CATH N/A 04/10/2021   Procedure: RIGHT HEART CATH;  Surgeon: Larey Dresser, MD;  Location: [facility];  Service: Cardiovascular;  Laterality: N/A;   TONSILLECTOMY  1962   Patient Active Problem List   Diagnosis Date Noted   Pulmonary hypertension, primary ([place]) 03/09/2021   Low TSH level 03/09/2021   Right knee pain 03/06/2021   Primary osteoarthritis of right knee 02/25/2021   Adnexal mass    A-V fistula ([place]) 08/03/2019   Nonrheumatic mitral valve regurgitation 06/12/2019   Edema 04/09/2019   Drug-induced constipation 10/18/2018   External prolapsed hemorrhoids 09/25/2018   Hyperphosphatemia 03/09/2018   Leg swelling 03/03/2018   Long term (current) use of anticoagulants 11/01/2017   Atrial fibrillation ([place]) 10/25/2017   Hyperparathyroidism, unspecified ([place]) 05/04/2017   DDD (degenerative disc disease), lumbar 04/07/2017   Hearing loss 01/04/2017   Hyperlipidemia 01/04/2017   Arthritis of fingers of both hands 10/01/2016   Chronic rhinitis 10/01/2016   Nonrheumatic aortic valve insufficiency 09/29/2016   Bruit 09/29/2016   Palpitation 09/22/2016   Aortic valve disease 04/28/2016   Anemia in ESRD (end-stage renal disease) ([place]) 03/03/2016   Sun-damaged skin 01/21/2016   ESRD (end stage renal disease) ([place]) 11/09/2015   Advanced care planning/counseling discussion 06/10/2014  Pedal edema 03/28/2014   Proteinuria 02/18/2014   Hypercalcemia 02/18/2014   Abnormal EKG 01/22/2014   HTN (hypertension) 01/22/2014   Dermatitis 06/30/2013   Abnormal thyroid function test 06/25/2013   Cervical cancer screening 06/10/2012   Preventative health care 11/09/2011   Multiple chemical sensitivity syndrome 06/25/2010   History of chicken pox 06/25/2010   Bladder polyps 06/25/2010   History of cardiac dysrhythmia 06/25/2010   Insomnia 06/24/2010   Fatigue 06/24/2010   Chronic headaches 06/24/2010   Allergy    Anemia    Depression     PCP:  [name], MD   REFERRING PROVIDER: Terrilyn Saver, NP   REFERRING DIAG: M54.9 (ICD-10-CM) - Upper back pain   THERAPY DIAG:  Pain in thoracic spine  Abnormal posture  Muscle spasm of back  Other low back pain  Muscle weakness (generalized)  RATIONALE FOR EVALUATION AND TREATMENT: Rehabilitation  ONSET DATE: late Nov 2023  NEXT MD VISIT: 05/05/22 with PCP   SUBJECTIVE:                                                                                                                                                                                                         SUBJECTIVE STATEMENT: A little stiff and sore today. Can't sleep more than two hours. The band exercises help, but nothing else except deep massage.  PAIN: Are you having pain? Yes: NPRS scale: 3/10 Pain location: middle of  left shoulder blade Pain description: stabbing when overworked Aggravating factors: at night worse, only sleeps 2 hours, dishes, cooking, getting things in/out of dryer, driving Relieving factors: deep massage, icy/hot  PERTINENT HISTORY:  mild scoliosis; OA - R knee, hands; lumbar DDD; anemia; a-fib; aortic valve disease; HTN; ESRD - HD MWF; hearing loss; anxiety; depression; chronic headaches; insomnia; fatigue  PRECAUTIONS: Other: HD MWF - fistula in L upper arm  WEIGHT BEARING RESTRICTIONS: No  FALLS:  Has patient fallen in last 6 months? No  LIVING ENVIRONMENT: Lives with: lives with their spouse and lives with their daughter Lives in: House/apartment Stairs: Yes: Internal: 14 steps; on right going up (dtr lives upstairs, she rarely goes up) Has following equipment at home: shower chair  OCCUPATION: Retired  PLOF: Independent and Leisure: reading on Kindle, walking when she has the energy ~2x/wk or when shopping  PATIENT GOALS: "To be able to sleep w/o back hurting. To be able to sit through HD and do things around the house w/o pain."   OBJECTIVE:   DIAGNOSTIC  FINDINGS:  03/03/22 - Thoracic spine x-ray: IMPRESSION:  1. No acute fracture. 2. Mild degenerative changes in the thoracic spine.  PATIENT SURVEYS:  NDI 18 / 50 = 36.0 %  COGNITION:  Overall cognitive status: Within functional limits for tasks assessed    SENSATION: WFL  MUSCLE LENGTH: Hamstrings: mild/mod tight L>R ITB: mod tight R Piriformis: mod tight R Hip flexors: mod tight R, mild tight L Quads: mod tight R, mild tight L Heelcord: NT  POSTURE:  rounded shoulders, forward head, increased thoracic kyphosis, and mils scoliosis  PALPATION: TTP with increased muscle tension and taut bands in R>L thoracolumbar paraspinals, L >R cervicothoracic paraspinals and periscapular muscles   CERVICAL ROM:   Active ROM AROM (deg) eval  Flexion 56  Extension 24  Right lateral flexion 38  Left lateral flexion 20  Right rotation 56  Left rotation 71   (Blank rows = not tested)  UPPER EXTREMITY ROM:  Active ROM Right eval Left eval  Shoulder flexion WNL WNL  Shoulder extension 48 40  Shoulder abduction 170 144  Shoulder adduction    Shoulder internal rotation FIR - WNL FIR - WNL  Shoulder external rotation FER - WNL FER - WNL  Elbow flexion    Elbow extension    Wrist flexion    Wrist extension    Wrist ulnar deviation    Wrist radial deviation    Wrist pronation    Wrist supination     (Blank rows = not tested)   LUMBAR ROM:   Active  A/PROM  eval  Flexion Hands to ankles  Extension 50% limited  Right lateral flexion Hand to lateral knee  Left lateral flexion Hand to lateral knee  Right rotation 50% limited  Left rotation 50% limited  (Blank rows = not tested)  LOWER EXTREMITY ROM:    Grossly WFL/WNL  UPPER EXTREMITY MMT:  MMT Right eval Left eval  Shoulder flexion 4+ 4- *  Shoulder extension 4+ 4  Shoulder abduction 4 * 4- *  Shoulder adduction    Shoulder internal rotation 4+ 4  Shoulder external rotation 4+ 4-  Middle trapezius 4 4-   Lower trapezius    Elbow flexion    Elbow extension    Wrist flexion    Wrist extension    Wrist ulnar deviation    Wrist radial deviation    Wrist pronation    Wrist supination    Grip strength     (Blank rows = not tested)  LOWER EXTREMITY MMT:    MMT Right eval Left eval  Hip flexion 4- 4-  Hip extension unable unable  Hip abduction 4- 4-  Hip adduction 4 4-  Hip internal rotation 4 4-  Hip external rotation 4- 4-  Knee flexion 5 5  Knee extension 5 5  Ankle dorsiflexion 4- 4-  Ankle plantarflexion    Ankle inversion    Ankle eversion     (Blank rows = not tested)   TODAY'S TREATMENT:  03/31/22 THERAPEUTIC EXERCISE: Instruction in initial HEP (see below) to improve flexibility, strength and mobility.  Verbal and tactile cues throughout for technique.  UBE L1x6 min  Manual Therapy: to decrease muscle spasm, pain and improve mobility.  Skilled palpation and monitoring of soft tissues during DN; STM to L UT, LS, rhomboids, erector spinae and low traps Trigger Point Dry-Needling  Treatment instructions: Expect mild to moderate muscle soreness. S/S of pneumothorax if dry needled over a lung field, and to seek immediate medical attention should they occur. Patient verbalized understanding of these  instructions and education. Patient Consent Given: Yes Education handout provided: Yes Muscles treated: L UT, LS, rhomboids, thoracic erector spinae, lats, IS Electrical stimulation performed: No Parameters: N/A Treatment response/outcome: Twitch Response Elicited and Palpable Increase in Muscle Length   03/26/22 THERAPEUTIC EXERCISE: Instruction in initial HEP (see below) to improve flexibility, strength and mobility.  Verbal and tactile cues throughout for technique.  UBE L2x6 min ER YTB standing in doorframe x 10 Thoracic hugs standing against pool noodle x 10 Shoulder rolls standing against pool noodle x 10 Seated thoracic ext arms crossed 10x5" S/L open book 10x5" L    Review of posture and body mehcnics handout with patient  STM to L thoracic paraspinals and medial scapular border  03/24/22 THERAPEUTIC EXERCISE: Instruction in initial HEP (see below) to improve flexibility, strength and mobility.  Verbal and tactile cues throughout for technique.  UBE L1x6 min Standing row YTB 2x10 Standing shoulder ext YTB x 10 ER with YTB standing in doorway x 10 Horiz ABD YTB standing in doorway x 10 Seated thoracic hugs x 10 Wall push up with a plus x 10 Lower trap set with arm raise x 10 Manual Therapy: to decrease muscle spasm, pain and improve mobility.  STM to L UT, LS, rhomboids   PATIENT EDUCATION:  Education details: self-STM techniques to parascapular muscles using ball and DN rational, procedure, outcomes, potential side effects, and recommended post-treatment exercises/activity Person educated: Patient Education method: Explanation, Demonstration, and Handouts Education comprehension: verbalized understanding  HOME EXERCISE PROGRAM: Access Code: [email or web address] URL: [email or web address] Date: 03/26/2022 Prepared by: Clarene Essex  Exercises - Seated Cervical Retraction  - 2 x daily - 7 x weekly - 2 sets - 10 reps - 3-5 sec hold - Seated Scapular Retraction  - 2 x daily - 7 x weekly - 2 sets - 10 reps - 3-5 sec hold - Seated Scapular Retraction with External Rotation  - 2 x daily - 7 x weekly - 2 sets - 10 reps - 3 sec hold - Standing Shoulder Row with Anchored Resistance  - 1 x daily - 7 x weekly - 3 sets - 10 reps - Shoulder Extension with Anchored Resistance  - 1 x daily - 7 x weekly - 3 sets - 10 reps - Thoracic Foam Roll Mobilization Hug  - 1 x daily - 7 x weekly - 2 sets - 10 reps - Sidelying Thoracic Rotation with Open Book  - 1 x daily - 7 x weekly - 2 sets - 10 reps - 5 sec hold   ASSESSMENT:  CLINICAL IMPRESSION: Marchell reports ongoing L upper back and shoulder pain limiting sleep and ADLs significantly. She reports  significant relief with STM previously which allowed her to sleep. Initial trial of DN/MT done today with excellent response. Patient reports no clicking in scapula post DN and decreased tissue tension. Also advised self MFR with ball which she has done previously.    OBJECTIVE IMPAIRMENTS: decreased activity tolerance, decreased knowledge of condition, decreased mobility, difficulty walking, decreased ROM, decreased strength, hypomobility, increased fascial restrictions, impaired perceived functional ability, increased muscle spasms, impaired flexibility, impaired UE functional use, improper body mechanics, postural dysfunction, and pain.   ACTIVITY LIMITATIONS: carrying, lifting, bending, sitting, standing, squatting, sleeping, reach over head, hygiene/grooming, and caring for others  PARTICIPATION LIMITATIONS: meal prep, cleaning, laundry, driving, shopping, and community activity  PERSONAL FACTORS: Age, Past/current experiences, Time since onset of injury/illness/exacerbation, and 3+ comorbidities: mild scoliosis; OA - R knee, hands; lumbar DDD; anemia; a-fib; aortic  valve disease; HTN; ESRD - HD MWF; hearing loss; anxiety; depression; chronic headaches; insomnia; fatigue  are also affecting patient's functional outcome.   REHAB POTENTIAL: Good  CLINICAL DECISION MAKING: Evolving/moderate complexity  EVALUATION COMPLEXITY: Moderate   GOALS: Goals reviewed with patient? Yes  SHORT TERM GOALS: Target date: 04/02/2022  Patient will be independent with initial HEP to improve outcomes and carryover.  Baseline:  Goal status: MET - 03/24/22  2.  Patient will be able to verbalize and demonstrate proper posture with sitting/standing to decrease/reduce muscle tightness/reinjury .  Baseline:  Goal status: IN PROGRESS  LONG TERM GOALS: Target date: 04/30/2022  Patient will be independent with ongoing/advanced HEP for self-management at home.  Baseline:  Goal status: IN PROGRESS  2.  Patient  will report 75% improvement in back and periscapular pain pain to improve QOL.  Baseline:  Goal status: IN PROGRESS  3.  Patient to demonstrate ability to achieve and maintain good spinal alignment/posturing and body mechanics needed for daily activities. Baseline:  Goal status: IN PROGRESS  4.  Patient will demonstrate functional pain free cervical and lumbar ROM to perform ADLs.   Baseline:  refer to above ROM tables Goal status: IN PROGRESS  5.  Patient will demonstrate improved proximal B LE strength to >/= 4+/5 for improved stability and ease of mobility . Baseline: refer to above LE MMT table Goal status: IN PROGRESS  6.  Patient will demonstrate improved B shoulder and scapular strength to >/= 4+/5 for functional UE use w/o pain interference.  Baseline: refer to above UE MMT table Goal status: IN PROGRESS   7. Patient will report </= 21% on NDI to demonstrate improved functional ability with decreased pain interference. Baseline: 18 / 50 = 36.0 % Goal status: IN PROGRESS  8.  Patient will tolerate 3 hours of sitting w/o increased pain to allow for improved activity tolerance during HD treatments. Baseline:  Goal status: IN PROGRESS  9.  Patient will report </= 25% sleep disturbance due to back or periscapular pain. Baseline: sleep disturbed for up to 3-5 hrs per NDI Goal status: IN PROGRESS    PLAN:  PT FREQUENCY: 2x/week  PT DURATION: 8 weeks  PLANNED INTERVENTIONS: Therapeutic exercises, Therapeutic activity, Neuromuscular re-education, Balance training, Gait training, Patient/Family education, Self Care, Joint mobilization, Aquatic Therapy, Dry Needling, Electrical stimulation, Spinal manipulation, Cryotherapy, Moist heat, Taping, Ultrasound, Manual therapy, and Re-evaluation  PLAN FOR NEXT SESSION: Assess DN and continue as indicated, progress rhomboid and mid/low trap strengthening. progress postural and lumbopelvic flexibility/stretching and  strengthening.   Iren Whipp, PT 03/31/2022, 2:45 PM

## 2022-03-31 ENCOUNTER — Encounter: Payer: Self-pay | Admitting: Physical Therapy

## 2022-03-31 ENCOUNTER — Ambulatory Visit: Payer: Medicare HMO | Admitting: Physical Therapy

## 2022-03-31 DIAGNOSIS — M6281 Muscle weakness (generalized): Secondary | ICD-10-CM | POA: Diagnosis not present

## 2022-03-31 DIAGNOSIS — M546 Pain in thoracic spine: Secondary | ICD-10-CM | POA: Diagnosis not present

## 2022-03-31 DIAGNOSIS — M62830 Muscle spasm of back: Secondary | ICD-10-CM

## 2022-03-31 DIAGNOSIS — R293 Abnormal posture: Secondary | ICD-10-CM

## 2022-03-31 DIAGNOSIS — M5459 Other low back pain: Secondary | ICD-10-CM

## 2022-03-31 DIAGNOSIS — M549 Dorsalgia, unspecified: Secondary | ICD-10-CM | POA: Diagnosis not present

## 2022-04-01 DIAGNOSIS — N2581 Secondary hyperparathyroidism of renal origin: Secondary | ICD-10-CM | POA: Diagnosis not present

## 2022-04-01 DIAGNOSIS — N186 End stage renal disease: Secondary | ICD-10-CM | POA: Diagnosis not present

## 2022-04-01 DIAGNOSIS — D509 Iron deficiency anemia, unspecified: Secondary | ICD-10-CM | POA: Diagnosis not present

## 2022-04-01 DIAGNOSIS — D631 Anemia in chronic kidney disease: Secondary | ICD-10-CM | POA: Diagnosis not present

## 2022-04-01 NOTE — Therapy (Signed)
OUTPATIENT PHYSICAL THERAPY TREATMENT   Patient Name: Jillian Hayes MRN: [id number] DOB:1945-11-13, 77 y.o., female Today's Date: 04/02/2022  END OF SESSION:  PT End of Session - 04/02/22 1523     Visit Number 6    Date for PT Re-Evaluation 04/30/22    Authorization Type [employer]    Progress Note Due on Visit 10    PT Start Time 1534    PT Stop Time 1615    PT Time Calculation (min) 41 min    Activity Tolerance Patient tolerated treatment well    Behavior During Therapy [facility] for tasks assessed/performed                 Past Medical History:  Diagnosis Date   Advanced care planning/counseling discussion 06/10/2014   05/31/2014 patient presents copy of HCP and Living Will   Anemia    iron deficiency   Anxiety    Arthritis    "in hands"   Atrial fibrillation ([place])    Benign fundic gland polyps of stomach    Bladder polyps 06/25/2010   Chronic headaches [id number]   Complication of anesthesia    "woke up at the end of a cyst removal surgery in 1991"   Depression 1991   hospitalized   Diverticulosis    ESRD (end stage renal disease) ([place]) 11/09/2015   HD on MWF   External prolapsed hemorrhoids    History of chicken pox 06/25/2010   Hypercalcemia 02/18/2014   Hypertension    Insomnia 06/24/2010   Multiple chemical sensitivity syndrome 06/25/2010   Proteinuria 02/18/2014   Renal insufficiency 03/26/2011   Valvular heart disease 04/28/2016   Past Surgical History:  Procedure Laterality Date   AV FISTULA PLACEMENT Left    x4   COLONOSCOPY  2014   cyst on left breast removed Left 1991   benign   ESOPHAGOGASTRODUODENOSCOPY (EGD) WITH ESOPHAGEAL DILATION  2014   LAPAROSCOPIC BILATERAL SALPINGO OOPHERECTOMY Bilateral 08/20/2020   Procedure: LAPAROSCOPIC BILATERAL SALPINGO OOPHORECTOMY;  Surgeon: Megan Salon, MD;  Location: [place];  Service: Gynecology;  Laterality: Bilateral;   NASAL SEPTUM SURGERY  1986   rhinoplasty   polyps on bladder removed  1972    benign   RIGHT HEART CATH N/A 04/10/2021   Procedure: RIGHT HEART CATH;  Surgeon: Larey Dresser, MD;  Location: [place] [facility];  Service: Cardiovascular;  Laterality: N/A;   TONSILLECTOMY  1962   Patient Active Problem List   Diagnosis Date Noted   Pulmonary hypertension, primary ([place]) 03/09/2021   Low TSH level 03/09/2021   Right knee pain 03/06/2021   Primary osteoarthritis of right knee 02/25/2021   Adnexal mass    A-V fistula ([place]) 08/03/2019   Nonrheumatic mitral valve regurgitation 06/12/2019   Edema 04/09/2019   Drug-induced constipation 10/18/2018   External prolapsed hemorrhoids 09/25/2018   Hyperphosphatemia 03/09/2018   Leg swelling 03/03/2018   Long term (current) use of anticoagulants 11/01/2017   Atrial fibrillation ([place]) 10/25/2017   Hyperparathyroidism, unspecified ([place]) 05/04/2017   DDD (degenerative disc disease), lumbar 04/07/2017   Hearing loss 01/04/2017   Hyperlipidemia 01/04/2017   Arthritis of fingers of both hands 10/01/2016   Chronic rhinitis 10/01/2016   Nonrheumatic aortic valve insufficiency 09/29/2016   Bruit 09/29/2016   Palpitation 09/22/2016   Aortic valve disease 04/28/2016   Anemia in ESRD (end-stage renal disease) ([place]) 03/03/2016   Sun-damaged skin 01/21/2016   ESRD (end stage renal disease) ([place]) 11/09/2015   Advanced care planning/counseling discussion 06/10/2014  Pedal edema 03/28/2014   Proteinuria 02/18/2014   Hypercalcemia 02/18/2014   Abnormal EKG 01/22/2014   HTN (hypertension) 01/22/2014   Dermatitis 06/30/2013   Abnormal thyroid function test 06/25/2013   Cervical cancer screening 06/10/2012   Preventative health care 11/09/2011   Multiple chemical sensitivity syndrome 06/25/2010   History of chicken pox 06/25/2010   Bladder polyps 06/25/2010   History of cardiac dysrhythmia 06/25/2010   Insomnia 06/24/2010   Fatigue 06/24/2010   Chronic headaches 06/24/2010   Allergy    Anemia    Depression     PCP:  [name], MD   REFERRING PROVIDER: Terrilyn Saver, NP   REFERRING DIAG: M54.9 (ICD-10-CM) - Upper back pain   THERAPY DIAG:  Pain in thoracic spine  Abnormal posture  Muscle spasm of back  Other low back pain  Muscle weakness (generalized)  RATIONALE FOR EVALUATION AND TREATMENT: Rehabilitation  ONSET DATE: late Nov 2023  NEXT MD VISIT: 05/05/22 with PCP   SUBJECTIVE:                                                                                                                                                                                                         SUBJECTIVE STATEMENT:   Pt reports she is still a little sore from the DN but thinks it helped. She notes her lower back is now acting up more which is still impacting her sleep.  PAIN: Are you having pain? Yes: NPRS scale:  4/10 Pain location: middle of  left shoulder blade Pain description: stabbing when overworked Aggravating factors: at night worse, only sleeps 2 hours, dishes, cooking, getting things in/out of dryer, driving Relieving factors: deep massage, icy/hot  Are you having pain? Yes: NPRS scale:  6/10 Pain location: B low back just above waist Pain description: grabs Aggravating factors: bending over at sink, washer and dryer Relieving factors: stretches, ice  PERTINENT HISTORY:  mild scoliosis; OA - R knee, hands; lumbar DDD; anemia; a-fib; aortic valve disease; HTN; ESRD - HD MWF; hearing loss; anxiety; depression; chronic headaches; insomnia; fatigue  PRECAUTIONS: Other: HD MWF - fistula in L upper arm  WEIGHT BEARING RESTRICTIONS: No  FALLS:  Has patient fallen in last 6 months? No  LIVING ENVIRONMENT: Lives with: lives with their spouse and lives with their daughter Lives in: House/apartment Stairs: Yes: Internal: 14 steps; on right going up (dtr lives upstairs, she rarely goes up) Has following equipment at home: shower chair  OCCUPATION: Retired  PLOF: Independent and  Leisure: reading on [place], walking when she has  the energy ~2x/wk or when shopping  PATIENT GOALS: "To be able to sleep w/o back hurting. To be able to sit through HD and do things around the house w/o pain."   OBJECTIVE:   DIAGNOSTIC FINDINGS:  03/03/22 - Thoracic spine x-ray: IMPRESSION: 1. No acute fracture. 2. Mild degenerative changes in the thoracic spine.  PATIENT SURVEYS:  NDI 18 / 50 = 36.0 %  COGNITION:  Overall cognitive status: Within functional limits for tasks assessed    SENSATION: WFL  MUSCLE LENGTH: Hamstrings: mild/mod tight L>R ITB: mod tight R Piriformis: mod tight R Hip flexors: mod tight R, mild tight L Quads: mod tight R, mild tight L Heelcord: NT  POSTURE:  rounded shoulders, forward head, increased thoracic kyphosis, and mils scoliosis  PALPATION: TTP with increased muscle tension and taut bands in R>L thoracolumbar paraspinals, L >R cervicothoracic paraspinals and periscapular muscles   CERVICAL ROM:   Active ROM AROM (deg) eval  Flexion 56  Extension 24  Right lateral flexion 38  Left lateral flexion 20  Right rotation 56  Left rotation 71   (Blank rows = not tested)  UPPER EXTREMITY ROM:  Active ROM Right eval Left eval  Shoulder flexion WNL WNL  Shoulder extension 48 40  Shoulder abduction 170 144  Shoulder adduction    Shoulder internal rotation FIR - WNL FIR - WNL  Shoulder external rotation FER - WNL FER - WNL  Elbow flexion    Elbow extension    Wrist flexion    Wrist extension    Wrist ulnar deviation    Wrist radial deviation    Wrist pronation    Wrist supination     (Blank rows = not tested)   LUMBAR ROM:   Active  A/PROM  eval  Flexion Hands to ankles  Extension 50% limited  Right lateral flexion Hand to lateral knee  Left lateral flexion Hand to lateral knee  Right rotation 50% limited  Left rotation 50% limited  (Blank rows = not tested)  LOWER EXTREMITY ROM:    Grossly WFL/WNL  UPPER  EXTREMITY MMT:  MMT Right eval Left eval  Shoulder flexion 4+ 4- *  Shoulder extension 4+ 4  Shoulder abduction 4 * 4- *  Shoulder adduction    Shoulder internal rotation 4+ 4  Shoulder external rotation 4+ 4-  Middle trapezius 4 4-  Lower trapezius    Elbow flexion    Elbow extension    Wrist flexion    Wrist extension    Wrist ulnar deviation    Wrist radial deviation    Wrist pronation    Wrist supination    Grip strength     (Blank rows = not tested)  LOWER EXTREMITY MMT:    MMT Right eval Left eval  Hip flexion 4- 4-  Hip extension unable unable  Hip abduction 4- 4-  Hip adduction 4 4-  Hip internal rotation 4 4-  Hip external rotation 4- 4-  Knee flexion 5 5  Knee extension 5 5  Ankle dorsiflexion 4- 4-  Ankle plantarflexion    Ankle inversion    Ankle eversion     (Blank rows = not tested)   TODAY'S TREATMENT:   04/02/22 THERAPEUTIC EXERCISE: Instruction in initial HEP (see below) to improve flexibility, strength and mobility.  Verbal and tactile cues throughout for technique.  UBE L1.0 x 6 min Seated RTB B scap retraction & shoulder ER 10 x 3" Seated RTB B scap retraction & shoulder horiz  ABD 10 x 3" - pt noting cramping sensation in R lower traps/thoracic paraspinals Seated R/L QL stretch x 30" Seated rhomboid/lower trap stretch with shoulders rounded and hands clasped pressing forward x 30" 3-way doorway pec stretch at 60/90/120 deg x 30" each Serratus wall slide on towel x 10 Serratus wall slide on towel + YTB isometric x 10 Hooklying PPT 10 x 5" Hooklying L HS stretch with strap 3 x 30"  MANUAL THERAPY: To promote normalized muscle tension, improved flexibility, and reduced pain.  STM and manual TPR to L lower traps and thoracic paraspinals   03/31/22 THERAPEUTIC EXERCISE: Instruction in initial HEP (see below) to improve flexibility, strength and mobility.  Verbal and tactile cues throughout for technique.  UBE L1x6 min  Manual Therapy: to  decrease muscle spasm, pain and improve mobility.  Skilled palpation and monitoring of soft tissues during DN; STM to L UT, LS, rhomboids, erector spinae and low traps Trigger Point Dry-Needling  Treatment instructions: Expect mild to moderate muscle soreness. S/S of pneumothorax if dry needled over a lung field, and to seek immediate medical attention should they occur. Patient verbalized understanding of these instructions and education. Patient Consent Given: Yes Education handout provided: Yes Muscles treated: L UT, LS, rhomboids, thoracic erector spinae, lats, IS Electrical stimulation performed: No Parameters: N/A Treatment response/outcome: Twitch Response Elicited and Palpable Increase in Muscle Length   03/26/22 THERAPEUTIC EXERCISE: Instruction in initial HEP (see below) to improve flexibility, strength and mobility.  Verbal and tactile cues throughout for technique.  UBE L2x6 min ER YTB standing in doorframe x 10 Thoracic hugs standing against pool noodle x 10 Shoulder rolls standing against pool noodle x 10 Seated thoracic ext arms crossed 10x5" S/L open book 10x5" L   Review of posture and body mehcnics handout with patient  STM to L thoracic paraspinals and medial scapular border   PATIENT EDUCATION:  Education details: HEP update Person educated: Patient Education method: Explanation, Demonstration, and Handouts Education comprehension: verbalized understanding  HOME EXERCISE PROGRAM: Access Code: [email or web address] URL: [email or web address] Date: 04/02/2022 Prepared by: Annie Paras  Exercises - Seated Cervical Retraction  - 2 x daily - 7 x weekly - 2 sets - 10 reps - 3-5 sec hold - Seated Scapular Retraction  - 2 x daily - 7 x weekly - 2 sets - 10 reps - 3-5 sec hold - Seated Scapular Retraction with External Rotation  - 2 x daily - 7 x weekly - 2 sets - 10 reps - 3 sec hold - Standing Shoulder Row with Anchored Resistance  - 1 x daily - 7 x weekly - 3  sets - 10 reps - Shoulder Extension with Anchored Resistance  - 1 x daily - 7 x weekly - 3 sets - 10 reps - Thoracic Foam Roll Mobilization Hug  - 1 x daily - 7 x weekly - 2 sets - 10 reps - Sidelying Thoracic Rotation with Open Book  - 1 x daily - 7 x weekly - 2 sets - 10 reps - 5 sec hold - Doorway Pec Stretch at 90 Degrees Abduction  - 2 x daily - 7 x weekly - 3 reps - 30 sec hold - Doorway Pec Stretch at 120 Degrees Abduction  - 2 x daily - 7 x weekly - 3 reps - 30 sec hold - Hooklying Hamstring Stretch with Strap  - 2 x daily - 7 x weekly - 3 reps - 30 sec hold - Supine Posterior Pelvic Tilt  -  1 x daily - 7 x weekly - 2 sets - 10 reps - 5 sec hold  Patient Education - Posture and Body Mechanics   ASSESSMENT:  CLINICAL IMPRESSION:  Jillian Hayes feels that the DN helped but still having some soreness from this as well as some new increased LBP. She reports the self-STM/MFR with the tennis ball in the sock is working well to continue to address the tightness. She was able to progress the TB resistance to a RTB with the postural strengthening exercises but did note some "grabbing" in her L lower traps/thoracic paraspinals which resolved with stretching and STM. She also notes tightness in the front of her shoulders/upper arms which was addressed with the 3-way doorway pec stretch before introducing/progressing serratus strengthening. Session concluded with brief review of the stretches she uses for her LBP, adding HS stretch and PPT. Jillian Hayes will continue to benefit from skilled PT to address her ongoing upper/mid/lower back pain, abnormal muscle tension and weakness to improve mobility and activity tolerance with decreased pain interference.   OBJECTIVE IMPAIRMENTS: decreased activity tolerance, decreased knowledge of condition, decreased mobility, difficulty walking, decreased ROM, decreased strength, hypomobility, increased fascial restrictions, impaired perceived functional ability, increased muscle  spasms, impaired flexibility, impaired UE functional use, improper body mechanics, postural dysfunction, and pain.   ACTIVITY LIMITATIONS: carrying, lifting, bending, sitting, standing, squatting, sleeping, reach over head, hygiene/grooming, and caring for others  PARTICIPATION LIMITATIONS: meal prep, cleaning, laundry, driving, shopping, and community activity  PERSONAL FACTORS: Age, Past/current experiences, Time since onset of injury/illness/exacerbation, and 3+ comorbidities: mild scoliosis; OA - R knee, hands; lumbar DDD; anemia; a-fib; aortic valve disease; HTN; ESRD - HD MWF; hearing loss; anxiety; depression; chronic headaches; insomnia; fatigue  are also affecting patient's functional outcome.   REHAB POTENTIAL: Good  CLINICAL DECISION MAKING: Evolving/moderate complexity  EVALUATION COMPLEXITY: Moderate   GOALS: Goals reviewed with patient? Yes  SHORT TERM GOALS: Target date: 04/02/2022  Patient will be independent with initial HEP to improve outcomes and carryover.  Baseline:  Goal status: MET - 03/24/22  2.  Patient will be able to verbalize and demonstrate proper posture with sitting/standing to decrease/reduce muscle tightness/reinjury .  Baseline:  Goal status: MET - 04/02/22  LONG TERM GOALS: Target date: 04/30/2022  Patient will be independent with ongoing/advanced HEP for self-management at home.  Baseline:  Goal status: IN PROGRESS  2.  Patient will report 75% improvement in back and periscapular pain pain to improve QOL.  Baseline:  Goal status: IN PROGRESS  3.  Patient to demonstrate ability to achieve and maintain good spinal alignment/posturing and body mechanics needed for daily activities. Baseline:  Goal status: IN PROGRESS  4.  Patient will demonstrate functional pain free cervical and lumbar ROM to perform ADLs.   Baseline:  refer to above ROM tables Goal status: IN PROGRESS  5.  Patient will demonstrate improved proximal B LE strength to >/= 4+/5  for improved stability and ease of mobility . Baseline: refer to above LE MMT table Goal status: IN PROGRESS  6.  Patient will demonstrate improved B shoulder and scapular strength to >/= 4+/5 for functional UE use w/o pain interference.  Baseline: refer to above UE MMT table Goal status: IN PROGRESS   7. Patient will report </= 21% on NDI to demonstrate improved functional ability with decreased pain interference. Baseline: 18 / 50 = 36.0 % Goal status: IN PROGRESS  8.  Patient will tolerate 3 hours of sitting w/o increased pain to allow for improved  activity tolerance during HD treatments. Baseline:  Goal status: IN PROGRESS  9.  Patient will report </= 25% sleep disturbance due to back or periscapular pain. Baseline: sleep disturbed for up to 3-5 hrs per NDI Goal status: IN PROGRESS    PLAN:  PT FREQUENCY: 2x/week  PT DURATION: 8 weeks  PLANNED INTERVENTIONS: Therapeutic exercises, Therapeutic activity, Neuromuscular re-education, Balance training, Gait training, Patient/Family education, Self Care, Joint mobilization, Aquatic Therapy, Dry Needling, Electrical stimulation, Spinal manipulation, Cryotherapy, Moist heat, Taping, Ultrasound, Manual therapy, and Re-evaluation  PLAN FOR NEXT SESSION:  Progress rhomboid and mid/low trap strengthening. Progress postural and lumbopelvic flexibility/stretching and strengthening. MT +/- DN as indicated.   Percival Spanish, PT 04/02/2022, 4:35 PM

## 2022-04-02 ENCOUNTER — Encounter: Payer: Self-pay | Admitting: Physical Therapy

## 2022-04-02 ENCOUNTER — Ambulatory Visit: Payer: Medicare HMO | Admitting: Physical Therapy

## 2022-04-02 DIAGNOSIS — M549 Dorsalgia, unspecified: Secondary | ICD-10-CM | POA: Diagnosis not present

## 2022-04-02 DIAGNOSIS — M546 Pain in thoracic spine: Secondary | ICD-10-CM

## 2022-04-02 DIAGNOSIS — M5459 Other low back pain: Secondary | ICD-10-CM

## 2022-04-02 DIAGNOSIS — M6281 Muscle weakness (generalized): Secondary | ICD-10-CM

## 2022-04-02 DIAGNOSIS — M62830 Muscle spasm of back: Secondary | ICD-10-CM

## 2022-04-02 DIAGNOSIS — R293 Abnormal posture: Secondary | ICD-10-CM

## 2022-04-02 DIAGNOSIS — N186 End stage renal disease: Secondary | ICD-10-CM | POA: Diagnosis not present

## 2022-04-02 DIAGNOSIS — Z992 Dependence on renal dialysis: Secondary | ICD-10-CM | POA: Diagnosis not present

## 2022-04-03 DIAGNOSIS — N186 End stage renal disease: Secondary | ICD-10-CM | POA: Diagnosis not present

## 2022-04-03 DIAGNOSIS — N2581 Secondary hyperparathyroidism of renal origin: Secondary | ICD-10-CM | POA: Diagnosis not present

## 2022-04-03 DIAGNOSIS — Z23 Encounter for immunization: Secondary | ICD-10-CM | POA: Diagnosis not present

## 2022-04-03 DIAGNOSIS — D631 Anemia in chronic kidney disease: Secondary | ICD-10-CM | POA: Diagnosis not present

## 2022-04-03 DIAGNOSIS — D509 Iron deficiency anemia, unspecified: Secondary | ICD-10-CM | POA: Diagnosis not present

## 2022-04-06 DIAGNOSIS — N2581 Secondary hyperparathyroidism of renal origin: Secondary | ICD-10-CM | POA: Diagnosis not present

## 2022-04-06 DIAGNOSIS — Z7901 Long term (current) use of anticoagulants: Secondary | ICD-10-CM | POA: Diagnosis not present

## 2022-04-06 DIAGNOSIS — D631 Anemia in chronic kidney disease: Secondary | ICD-10-CM | POA: Diagnosis not present

## 2022-04-06 DIAGNOSIS — N186 End stage renal disease: Secondary | ICD-10-CM | POA: Diagnosis not present

## 2022-04-06 DIAGNOSIS — D509 Iron deficiency anemia, unspecified: Secondary | ICD-10-CM | POA: Diagnosis not present

## 2022-04-06 DIAGNOSIS — Z23 Encounter for immunization: Secondary | ICD-10-CM | POA: Diagnosis not present

## 2022-04-07 ENCOUNTER — Ambulatory Visit: Payer: Medicare HMO | Attending: Family Medicine | Admitting: Physical Therapy

## 2022-04-07 ENCOUNTER — Encounter: Payer: Self-pay | Admitting: Physical Therapy

## 2022-04-07 DIAGNOSIS — M6281 Muscle weakness (generalized): Secondary | ICD-10-CM | POA: Insufficient documentation

## 2022-04-07 DIAGNOSIS — M62830 Muscle spasm of back: Secondary | ICD-10-CM | POA: Insufficient documentation

## 2022-04-07 DIAGNOSIS — R293 Abnormal posture: Secondary | ICD-10-CM | POA: Insufficient documentation

## 2022-04-07 DIAGNOSIS — M546 Pain in thoracic spine: Secondary | ICD-10-CM | POA: Diagnosis not present

## 2022-04-07 DIAGNOSIS — M5459 Other low back pain: Secondary | ICD-10-CM | POA: Diagnosis not present

## 2022-04-07 NOTE — Therapy (Signed)
OUTPATIENT PHYSICAL THERAPY TREATMENT   Patient Name: Jillian Hayes MRN: [id number] DOB:12-Oct-1945, 77 y.o., female Today's Date: 04/07/2022  END OF SESSION:  PT End of Session - 04/07/22 1310     Visit Number 7    Date for PT Re-Evaluation 04/30/22    Authorization Type [employer]    Progress Note Due on Visit 10    PT Start Time 1311    PT Stop Time 1400    PT Time Calculation (min) 49 min    Activity Tolerance Patient tolerated treatment well    Behavior During Therapy [facility] for tasks assessed/performed                 Past Medical History:  Diagnosis Date   Advanced care planning/counseling discussion 06/10/2014   05/31/2014 patient presents copy of HCP and Living Will   Anemia    iron deficiency   Anxiety    Arthritis    "in hands"   Atrial fibrillation ([place])    Benign fundic gland polyps of stomach    Bladder polyps 06/25/2010   Chronic headaches [id number]   Complication of anesthesia    "woke up at the end of a cyst removal surgery in 1991"   Depression 1991   hospitalized   Diverticulosis    ESRD (end stage renal disease) ([place]) 11/09/2015   HD on MWF   External prolapsed hemorrhoids    History of chicken pox 06/25/2010   Hypercalcemia 02/18/2014   Hypertension    Insomnia 06/24/2010   Multiple chemical sensitivity syndrome 06/25/2010   Proteinuria 02/18/2014   Renal insufficiency 03/26/2011   Valvular heart disease 04/28/2016   Past Surgical History:  Procedure Laterality Date   AV FISTULA PLACEMENT Left    x4   COLONOSCOPY  2014   cyst on left breast removed Left 1991   benign   ESOPHAGOGASTRODUODENOSCOPY (EGD) WITH ESOPHAGEAL DILATION  2014   LAPAROSCOPIC BILATERAL SALPINGO OOPHERECTOMY Bilateral 08/20/2020   Procedure: LAPAROSCOPIC BILATERAL SALPINGO OOPHORECTOMY;  Surgeon: Megan Salon, MD;  Location: [facility];  Service: Gynecology;  Laterality: Bilateral;   NASAL SEPTUM SURGERY  1986   rhinoplasty   polyps on bladder removed  1972    benign   RIGHT HEART CATH N/A 04/10/2021   Procedure: RIGHT HEART CATH;  Surgeon: Larey Dresser, MD;  Location: [place] [facility];  Service: Cardiovascular;  Laterality: N/A;   TONSILLECTOMY  1962   Patient Active Problem List   Diagnosis Date Noted   Pulmonary hypertension, primary ([place]) 03/09/2021   Low TSH level 03/09/2021   Right knee pain 03/06/2021   Primary osteoarthritis of right knee 02/25/2021   Adnexal mass    A-V fistula ([place]) 08/03/2019   Nonrheumatic mitral valve regurgitation 06/12/2019   Edema 04/09/2019   Drug-induced constipation 10/18/2018   External prolapsed hemorrhoids 09/25/2018   Hyperphosphatemia 03/09/2018   Leg swelling 03/03/2018   Long term (current) use of anticoagulants 11/01/2017   Atrial fibrillation ([place]) 10/25/2017   Hyperparathyroidism, unspecified ([place]) 05/04/2017   DDD (degenerative disc disease), lumbar 04/07/2017   Hearing loss 01/04/2017   Hyperlipidemia 01/04/2017   Arthritis of fingers of both hands 10/01/2016   Chronic rhinitis 10/01/2016   Nonrheumatic aortic valve insufficiency 09/29/2016   Bruit 09/29/2016   Palpitation 09/22/2016   Aortic valve disease 04/28/2016   Anemia in ESRD (end-stage renal disease) ([place]) 03/03/2016   Sun-damaged skin 01/21/2016   ESRD (end stage renal disease) ([place]) 11/09/2015   Advanced care planning/counseling discussion 06/10/2014  Pedal edema 03/28/2014   Proteinuria 02/18/2014   Hypercalcemia 02/18/2014   Abnormal EKG 01/22/2014   HTN (hypertension) 01/22/2014   Dermatitis 06/30/2013   Abnormal thyroid function test 06/25/2013   Cervical cancer screening 06/10/2012   Preventative health care 11/09/2011   Multiple chemical sensitivity syndrome 06/25/2010   History of chicken pox 06/25/2010   Bladder polyps 06/25/2010   History of cardiac dysrhythmia 06/25/2010   Insomnia 06/24/2010   Fatigue 06/24/2010   Chronic headaches 06/24/2010   Allergy    Anemia    Depression     PCP:  [name], MD   REFERRING PROVIDER: Terrilyn Saver, NP   REFERRING DIAG: M54.9 (ICD-10-CM) - Upper back pain   THERAPY DIAG:  Pain in thoracic spine  Abnormal posture  Muscle spasm of back  Other low back pain  Muscle weakness (generalized)  RATIONALE FOR EVALUATION AND TREATMENT: Rehabilitation  ONSET DATE: late Nov 2023  NEXT MD VISIT: 05/05/22 with PCP   SUBJECTIVE:                                                                                                                                                                                                         SUBJECTIVE STATEMENT:   Pt reports her le shoulder blade was really aching all during dialysis yesterday and kept her up part of the night. She would like to try the DN again today.  PAIN: Are you having pain? Yes: NPRS scale: 2-3/10 Pain location: middle of L shoulder blade Pain description: sore Aggravating factors: at night worse, only sleeps 2 hours, dishes, cooking, getting things in/out of dryer, driving Relieving factors: deep massage, icy/hot, DN  Are you having pain? Yes: NPRS scale:  6/10 Pain location: L low back just above waist Pain description: grabs periodically Aggravating factors: getting into the car, bending over at sink, washer and dryer Relieving factors: stretches, ice  PERTINENT HISTORY:  mild scoliosis; OA - R knee, hands; lumbar DDD; anemia; a-fib; aortic valve disease; HTN; ESRD - HD MWF; hearing loss; anxiety; depression; chronic headaches; insomnia; fatigue  PRECAUTIONS: Other: HD MWF - fistula in L upper arm  WEIGHT BEARING RESTRICTIONS: No  FALLS:  Has patient fallen in last 6 months? No  LIVING ENVIRONMENT: Lives with: lives with their spouse and lives with their daughter Lives in: House/apartment Stairs: Yes: Internal: 14 steps; on right going up (dtr lives upstairs, she rarely goes up) Has following equipment at home: shower chair  OCCUPATION: Retired  PLOF:  Independent and Leisure: reading on [place], walking when she  has the energy ~2x/wk or when shopping  PATIENT GOALS: "To be able to sleep w/o back hurting. To be able to sit through HD and do things around the house w/o pain."   OBJECTIVE:   DIAGNOSTIC FINDINGS:  03/03/22 - Thoracic spine x-ray: IMPRESSION: 1. No acute fracture. 2. Mild degenerative changes in the thoracic spine.  PATIENT SURVEYS:  NDI 18 / 50 = 36.0 %  COGNITION:  Overall cognitive status: Within functional limits for tasks assessed    SENSATION: WFL  MUSCLE LENGTH: Hamstrings: mild/mod tight L>R ITB: mod tight R Piriformis: mod tight R Hip flexors: mod tight R, mild tight L Quads: mod tight R, mild tight L Heelcord: NT  POSTURE:  rounded shoulders, forward head, increased thoracic kyphosis, and mils scoliosis  PALPATION: TTP with increased muscle tension and taut bands in R>L thoracolumbar paraspinals, L >R cervicothoracic paraspinals and periscapular muscles   CERVICAL ROM:   Active ROM AROM (deg) eval AROM 04/07/22  Flexion 56 58  Extension 24 38  Right lateral flexion 38 42  Left lateral flexion 20 28  Right rotation 56 61  Left rotation 71 66   (Blank rows = not tested)  UPPER EXTREMITY ROM:  Active ROM Right eval Left eval Right 04/07/22 Left    04/07/22  Shoulder flexion WNL WNL WNL WNL  Shoulder extension 48 40 48 45  Shoulder abduction 170 144 170 154  Shoulder adduction      Shoulder internal rotation FIR - WNL FIR - WNL    Shoulder external rotation FER - WNL FER - WNL    Elbow flexion      Elbow extension      Wrist flexion      Wrist extension      Wrist ulnar deviation      Wrist radial deviation      Wrist pronation      Wrist supination       (Blank rows = not tested)   LUMBAR ROM:   Active  AROM  eval AROM 04/09/22  Flexion Hands to ankles   Extension 50% limited   Right lateral flexion Hand to lateral knee   Left lateral flexion Hand to lateral knee   Right  rotation 50% limited   Left rotation 50% limited   (Blank rows = not tested)  LOWER EXTREMITY ROM:    Grossly WFL/WNL  UPPER EXTREMITY MMT:  MMT Right eval Left eval Right 04/07/22 Left 04/07/22  Shoulder flexion 4+ 4- * 4+ 4  Shoulder extension 4+ 4 4+ 4+  Shoulder abduction 4 * 4- * 4+ 4+  Shoulder adduction      Shoulder internal rotation 4+ 4 5 4+  Shoulder external rotation 4+ 4- 4+ 4+  Middle trapezius 4 4- 4 4  Lower trapezius      Elbow flexion      Elbow extension      Wrist flexion      Wrist extension      Wrist ulnar deviation      Wrist radial deviation      Wrist pronation      Wrist supination      Grip strength       (Blank rows = not tested)  LOWER EXTREMITY MMT:    MMT Right eval Left eval Right 04/09/22 Left 04/09/22  Hip flexion 4- 4-    Hip extension unable unable    Hip abduction 4- 4-    Hip adduction 4 4-  Hip internal rotation 4 4-    Hip external rotation 4- 4-    Knee flexion 5 5    Knee extension 5 5    Ankle dorsiflexion 4- 4-    Ankle plantarflexion      Ankle inversion      Ankle eversion       (Blank rows = not tested)   TODAY'S TREATMENT:   04/07/22 THERAPEUTIC EXERCISE: Instruction in initial HEP (see below) to improve flexibility, strength and mobility.  Verbal and tactile cues throughout for technique.  NuStep - L5 x 6 min (UE/LE) Mid doorway pec stretch x 30' S/L L open book stretch x 3 each through full arc and bow & arrow  MANUAL THERAPY: To promote normalized muscle tension, improved flexibility, improved joint mobility, increased ROM, and reduced pain. Skilled palpation and monitoring of soft tissue during DN Trigger Point Dry-Needling  Treatment instructions: Expect mild to moderate muscle soreness. S/S of pneumothorax if dry needled over a lung field, and to seek immediate medical attention should they occur. Patient verbalized understanding of these instructions and education. Patient Consent Given: Yes Education  handout provided: Previously provided Muscles treated: L infraspinatus, subscapularis, rhomboids, upper/mid/lower traps, LS, teres group and lats Electrical stimulation performed: Yes Parameters: N/A Treatment response/outcome: Twitch Response Elicited and Palpable Increase in Muscle Length STM/DTM, manual TPR and pin & stretch to muscles addressed with DN  THERAPEUTIC ACTIVITIES: Cervical & UE ROM UE MMT   04/02/22 THERAPEUTIC EXERCISE: Instruction in initial HEP (see below) to improve flexibility, strength and mobility.  Verbal and tactile cues throughout for technique.  UBE L1.0 x 6 min Seated RTB B scap retraction & shoulder ER 10 x 3" Seated RTB B scap retraction & shoulder horiz ABD 10 x 3" - pt noting cramping sensation in R lower traps/thoracic paraspinals Seated R/L QL stretch x 30" Seated rhomboid/lower trap stretch with shoulders rounded and hands clasped pressing forward x 30" 3-way doorway pec stretch at 60/90/120 deg x 30" each Serratus wall slide on towel x 10 Serratus wall slide on towel + YTB isometric x 10 Hooklying PPT 10 x 5" Hooklying L HS stretch with strap 3 x 30"  MANUAL THERAPY: To promote normalized muscle tension, improved flexibility, and reduced pain.  STM and manual TPR to L lower traps and thoracic paraspinals   03/31/22 THERAPEUTIC EXERCISE: Instruction in initial HEP (see below) to improve flexibility, strength and mobility.  Verbal and tactile cues throughout for technique.  UBE L1x6 min  Manual Therapy: to decrease muscle spasm, pain and improve mobility.  Skilled palpation and monitoring of soft tissues during DN; STM to L UT, LS, rhomboids, erector spinae and low traps Trigger Point Dry-Needling  Treatment instructions: Expect mild to moderate muscle soreness. S/S of pneumothorax if dry needled over a lung field, and to seek immediate medical attention should they occur. Patient verbalized understanding of these instructions and  education. Patient Consent Given: Yes Education handout provided: Yes Muscles treated: L UT, LS, rhomboids, thoracic erector spinae, lats, IS Electrical stimulation performed: No Parameters: N/A Treatment response/outcome: Twitch Response Elicited and Palpable Increase in Muscle Length   PATIENT EDUCATION:  Education details: HEP update Person educated: Patient Education method: Explanation, Demonstration, and Handouts Education comprehension: verbalized understanding  HOME EXERCISE PROGRAM: Access Code: [email or web address] URL: [email or web address] Date: 04/02/2022 Prepared by: Annie Paras  Exercises - Seated Cervical Retraction  - 2 x daily - 7 x weekly - 2 sets - 10 reps - 3-5 sec hold - Seated  Scapular Retraction  - 2 x daily - 7 x weekly - 2 sets - 10 reps - 3-5 sec hold - Seated Scapular Retraction with External Rotation  - 2 x daily - 7 x weekly - 2 sets - 10 reps - 3 sec hold - Standing Shoulder Row with Anchored Resistance  - 1 x daily - 7 x weekly - 3 sets - 10 reps - Shoulder Extension with Anchored Resistance  - 1 x daily - 7 x weekly - 3 sets - 10 reps - Thoracic Foam Roll Mobilization Hug  - 1 x daily - 7 x weekly - 2 sets - 10 reps - Sidelying Thoracic Rotation with Open Book  - 1 x daily - 7 x weekly - 2 sets - 10 reps - 5 sec hold - Doorway Pec Stretch at 90 Degrees Abduction  - 2 x daily - 7 x weekly - 3 reps - 30 sec hold - Doorway Pec Stretch at 120 Degrees Abduction  - 2 x daily - 7 x weekly - 3 reps - 30 sec hold - Hooklying Hamstring Stretch with Strap  - 2 x daily - 7 x weekly - 3 reps - 30 sec hold - Supine Posterior Pelvic Tilt  - 1 x daily - 7 x weekly - 2 sets - 10 reps - 5 sec hold  Patient Education - Posture and Body Mechanics   ASSESSMENT:  CLINICAL IMPRESSION:  Rosabella reports increased L periscapular pain during her dialysis treatment yesterday continuing into the evening and preventing her from falling asleep until 3:00 am.  She notes  benefit from previous DN and requested this again today.  Increased muscle tension and TTP evident in multiple L periscapular muscles most notably in the rhomboids and lower traps as well as UT/LS. After review of DN procedure, patient verbalized consent to DN treatment in conjunction with manual STM/DTM and TPR to reduce ttp/muscle tension. Muscles treated as indicated above. DN produced normal response with good twitches elicited resulting in palpable reduction in pain/ttp and muscle tension. Pt educated to expect mild to moderate muscle soreness for up to 24-48 hrs and instructed to continue prescribed home exercise program and current activity level with pt verbalizing understanding of theses instructions.  Pt requesting review of most recent stretches attempted last visit, with PT providing clarification of positioning for doorway pec stretch and open book stretch.  Reassessment of cervical and upper extremity range of motion demonstrating improved symmetry with decreased restriction on L other than those resulting from her dialysis fistula. UE strength also improving with better symmetry between B UE.  Ramsi will continue to benefit from skilled PT to address her ongoing upper/mid/lower back pain, abnormal muscle tension and weakness to improve mobility and activity tolerance with decreased pain interference.   OBJECTIVE IMPAIRMENTS: decreased activity tolerance, decreased knowledge of condition, decreased mobility, difficulty walking, decreased ROM, decreased strength, hypomobility, increased fascial restrictions, impaired perceived functional ability, increased muscle spasms, impaired flexibility, impaired UE functional use, improper body mechanics, postural dysfunction, and pain.   ACTIVITY LIMITATIONS: carrying, lifting, bending, sitting, standing, squatting, sleeping, reach over head, hygiene/grooming, and caring for others  PARTICIPATION LIMITATIONS: meal prep, cleaning, laundry, driving, shopping,  and community activity  PERSONAL FACTORS: Age, Past/current experiences, Time since onset of injury/illness/exacerbation, and 3+ comorbidities: mild scoliosis; OA - R knee, hands; lumbar DDD; anemia; a-fib; aortic valve disease; HTN; ESRD - HD MWF; hearing loss; anxiety; depression; chronic headaches; insomnia; fatigue  are also affecting patient's functional outcome.  REHAB POTENTIAL: Good  CLINICAL DECISION MAKING: Evolving/moderate complexity  EVALUATION COMPLEXITY: Moderate   GOALS: Goals reviewed with patient? Yes  SHORT TERM GOALS: Target date: 04/02/2022  Patient will be independent with initial HEP to improve outcomes and carryover.  Baseline:  Goal status: MET - 03/24/22  2.  Patient will be able to verbalize and demonstrate proper posture with sitting/standing to decrease/reduce muscle tightness/reinjury .  Baseline:  Goal status: MET - 04/02/22  LONG TERM GOALS: Target date: 04/30/2022  Patient will be independent with ongoing/advanced HEP for self-management at home.  Baseline:  Goal status: IN PROGRESS  2.  Patient will report 75% improvement in back and periscapular pain pain to improve QOL.  Baseline:  Goal status: IN PROGRESS  3.  Patient to demonstrate ability to achieve and maintain good spinal alignment/posturing and body mechanics needed for daily activities. Baseline:  Goal status: IN PROGRESS  4.  Patient will demonstrate functional pain free cervical and lumbar ROM to perform ADLs.   Baseline:  refer to above ROM tables Goal status: IN PROGRESS  04/07/22 - Cervical ROM improving but still with discomfort during L rotation  5.  Patient will demonstrate improved proximal B LE strength to >/= 4+/5 for improved stability and ease of mobility . Baseline: refer to above LE MMT table Goal status: IN PROGRESS  6.  Patient will demonstrate improved B shoulder and scapular strength to >/= 4+/5 for functional UE use w/o pain interference.  Baseline: refer to  above UE MMT table Goal status: PARTIALLY MET  04/07/22  7. Patient will report </= 21% on NDI to demonstrate improved functional ability with decreased pain interference. Baseline: 18 / 50 = 36.0 % Goal status: IN PROGRESS  8.  Patient will tolerate 3 hours of sitting w/o increased pain to allow for improved activity tolerance during HD treatments. Baseline:  Goal status: IN PROGRESS  9.  Patient will report </= 25% sleep disturbance due to back or periscapular pain. Baseline: sleep disturbed for up to 3-5 hrs per NDI Goal status: IN PROGRESS  04/07/22 - Pt reports she was unable to fall asleep until after 3 am last night due to pain     PLAN:  PT FREQUENCY: 2x/week  PT DURATION: 8 weeks  PLANNED INTERVENTIONS: Therapeutic exercises, Therapeutic activity, Neuromuscular re-education, Balance training, Gait training, Patient/Family education, Self Care, Joint mobilization, Aquatic Therapy, Dry Needling, Electrical stimulation, Spinal manipulation, Cryotherapy, Moist heat, Taping, Ultrasound, Manual therapy, and Re-evaluation  PLAN FOR NEXT SESSION: Assess response to DN. Reassess remaining ROM and strength for low back and LEs. Progress postural strengthening with emphasis on rhomboid and mid/low trap strengthening. Progress postural and lumbopelvic flexibility/stretching and strengthening. MT +/- DN as indicated.   Percival Spanish, PT 04/07/2022, 4:43 PM

## 2022-04-08 DIAGNOSIS — D631 Anemia in chronic kidney disease: Secondary | ICD-10-CM | POA: Diagnosis not present

## 2022-04-08 DIAGNOSIS — N2581 Secondary hyperparathyroidism of renal origin: Secondary | ICD-10-CM | POA: Diagnosis not present

## 2022-04-08 DIAGNOSIS — Z23 Encounter for immunization: Secondary | ICD-10-CM | POA: Diagnosis not present

## 2022-04-08 DIAGNOSIS — N186 End stage renal disease: Secondary | ICD-10-CM | POA: Diagnosis not present

## 2022-04-08 DIAGNOSIS — D509 Iron deficiency anemia, unspecified: Secondary | ICD-10-CM | POA: Diagnosis not present

## 2022-04-09 ENCOUNTER — Encounter: Payer: Self-pay | Admitting: Physical Therapy

## 2022-04-09 ENCOUNTER — Ambulatory Visit: Payer: Medicare HMO | Admitting: Physical Therapy

## 2022-04-09 DIAGNOSIS — M546 Pain in thoracic spine: Secondary | ICD-10-CM | POA: Diagnosis not present

## 2022-04-09 DIAGNOSIS — R293 Abnormal posture: Secondary | ICD-10-CM | POA: Diagnosis not present

## 2022-04-09 DIAGNOSIS — M62830 Muscle spasm of back: Secondary | ICD-10-CM | POA: Diagnosis not present

## 2022-04-09 DIAGNOSIS — M5459 Other low back pain: Secondary | ICD-10-CM | POA: Diagnosis not present

## 2022-04-09 DIAGNOSIS — M6281 Muscle weakness (generalized): Secondary | ICD-10-CM

## 2022-04-09 NOTE — Therapy (Signed)
OUTPATIENT PHYSICAL THERAPY TREATMENT   Patient Name: Jillian Hayes MRN: [id number] DOB:[birth year], 77 y.o., female Today's Date: 04/09/2022  END OF SESSION:  PT End of Session - 04/09/22 1404     Visit Number 8    Date for PT Re-Evaluation 04/30/22    Authorization Type [employer]    Progress Note Due on Visit 10    PT Start Time 1404    PT Stop Time 1445    PT Time Calculation (min) 41 min    Activity Tolerance Patient tolerated treatment well    Behavior During Therapy [facility] for tasks assessed/performed                 Past Medical History:  Diagnosis Date   Advanced care planning/counseling discussion 06/10/2014   05/31/2014 patient presents copy of HCP and Living Will   Anemia    iron deficiency   Anxiety    Arthritis    "in hands"   Atrial fibrillation ([place])    Benign fundic gland polyps of stomach    Bladder polyps 06/25/2010   Chronic headaches [id number]   Complication of anesthesia    "woke up at the end of a cyst removal surgery in 1991"   Depression 1991   hospitalized   Diverticulosis    ESRD (end stage renal disease) ([place]) 11/09/2015   HD on MWF   External prolapsed hemorrhoids    History of chicken pox 06/25/2010   Hypercalcemia 02/18/2014   Hypertension    Insomnia 06/24/2010   Multiple chemical sensitivity syndrome 06/25/2010   Proteinuria 02/18/2014   Renal insufficiency 03/26/2011   Valvular heart disease 04/28/2016   Past Surgical History:  Procedure Laterality Date   AV FISTULA PLACEMENT Left    x4   COLONOSCOPY  2014   cyst on left breast removed Left 1991   benign   ESOPHAGOGASTRODUODENOSCOPY (EGD) WITH ESOPHAGEAL DILATION  2014   LAPAROSCOPIC BILATERAL SALPINGO OOPHERECTOMY Bilateral 08/20/2020   Procedure: LAPAROSCOPIC BILATERAL SALPINGO OOPHORECTOMY;  Surgeon: Megan Salon, MD;  Location: [place];  Service: Gynecology;  Laterality: Bilateral;   NASAL SEPTUM SURGERY  1986   rhinoplasty   polyps on bladder removed  1972    benign   RIGHT HEART CATH N/A 04/10/2021   Procedure: RIGHT HEART CATH;  Surgeon: Larey Dresser, MD;  Location: [place] [facility];  Service: Cardiovascular;  Laterality: N/A;   TONSILLECTOMY  1962   Patient Active Problem List   Diagnosis Date Noted   Pulmonary hypertension, primary ([place]) 03/09/2021   Low TSH level 03/09/2021   Right knee pain 03/06/2021   Primary osteoarthritis of right knee 02/25/2021   Adnexal mass    A-V fistula ([place]) 08/03/2019   Nonrheumatic mitral valve regurgitation 06/12/2019   Edema 04/09/2019   Drug-induced constipation 10/18/2018   External prolapsed hemorrhoids 09/25/2018   Hyperphosphatemia 03/09/2018   Leg swelling 03/03/2018   Long term (current) use of anticoagulants 11/01/2017   Atrial fibrillation ([place]) 10/25/2017   Hyperparathyroidism, unspecified ([place]) 05/04/2017   DDD (degenerative disc disease), lumbar 04/07/2017   Hearing loss 01/04/2017   Hyperlipidemia 01/04/2017   Arthritis of fingers of both hands 10/01/2016   Chronic rhinitis 10/01/2016   Nonrheumatic aortic valve insufficiency 09/29/2016   Bruit 09/29/2016   Palpitation 09/22/2016   Aortic valve disease 04/28/2016   Anemia in ESRD (end-stage renal disease) ([place]) 03/03/2016   Sun-damaged skin 01/21/2016   ESRD (end stage renal disease) ([place]) 11/09/2015   Advanced care planning/counseling discussion 06/10/2014  Pedal edema 03/28/2014   Proteinuria 02/18/2014   Hypercalcemia 02/18/2014   Abnormal EKG 01/22/2014   HTN (hypertension) 01/22/2014   Dermatitis 06/30/2013   Abnormal thyroid function test 06/25/2013   Cervical cancer screening 06/10/2012   Preventative health care 11/09/2011   Multiple chemical sensitivity syndrome 06/25/2010   History of chicken pox 06/25/2010   Bladder polyps 06/25/2010   History of cardiac dysrhythmia 06/25/2010   Insomnia 06/24/2010   Fatigue 06/24/2010   Chronic headaches 06/24/2010   Allergy    Anemia    Depression     PCP:  [name], MD   REFERRING PROVIDER: Terrilyn Saver, NP   REFERRING DIAG: M54.9 (ICD-10-CM) - Upper back pain   THERAPY DIAG:  Pain in thoracic spine  Abnormal posture  Other low back pain  Muscle weakness (generalized)  RATIONALE FOR EVALUATION AND TREATMENT: Rehabilitation  ONSET DATE: late Nov 2023  NEXT MD VISIT: 05/05/22 with PCP   SUBJECTIVE:                                                                                                                                                                                                         SUBJECTIVE STATEMENT:   Pt reports "sleeps is elusive" - increased pain Tuesday night ang yesterday during HD. No pain last night but still couldn't sleep. A little sore after driving around doing errands today but no pain.  PAIN: Are you having pain? Yes: NPRS scale:  0/10 Pain location: middle of L shoulder blade Pain description: sore Aggravating factors: at night worse, only sleeps 2 hours, dishes, cooking, getting things in/out of dryer, driving Relieving factors: deep massage, icy/hot, DN  Are you having pain? Yes: NPRS scale:  3/10 Pain location: L low back just above waist Pain description: stiff, grabs periodically Aggravating factors: getting into the car, bending over at sink, washer and dryer Relieving factors: stretches, ice  PERTINENT HISTORY:  mild scoliosis; OA - R knee, hands; lumbar DDD; anemia; a-fib; aortic valve disease; HTN; ESRD - HD MWF; hearing loss; anxiety; depression; chronic headaches; insomnia; fatigue  PRECAUTIONS: Other: HD MWF - fistula in L upper arm  WEIGHT BEARING RESTRICTIONS: No  FALLS:  Has patient fallen in last 6 months? No  LIVING ENVIRONMENT: Lives with: lives with their spouse and lives with their daughter Lives in: House/apartment Stairs: Yes: Internal: 14 steps; on right going up (dtr lives upstairs, she rarely goes up) Has following equipment at home: shower  chair  OCCUPATION: Retired  PLOF: Independent and Leisure: reading on [place], walking when  she has the energy ~2x/wk or when shopping  PATIENT GOALS: "To be able to sleep w/o back hurting. To be able to sit through HD and do things around the house w/o pain."   OBJECTIVE:   DIAGNOSTIC FINDINGS:  03/03/22 - Thoracic spine x-ray: IMPRESSION: 1. No acute fracture. 2. Mild degenerative changes in the thoracic spine.  PATIENT SURVEYS:  NDI 18 / 50 = 36.0 %  COGNITION:  Overall cognitive status: Within functional limits for tasks assessed    SENSATION: WFL  MUSCLE LENGTH: Hamstrings: mild/mod tight L>R ITB: mod tight R Piriformis: mod tight R Hip flexors: mod tight R, mild tight L Quads: mod tight R, mild tight L Heelcord: NT  POSTURE:  rounded shoulders, forward head, increased thoracic kyphosis, and mils scoliosis  PALPATION: TTP with increased muscle tension and taut bands in R>L thoracolumbar paraspinals, L >R cervicothoracic paraspinals and periscapular muscles   CERVICAL ROM:   Active ROM AROM (deg) eval AROM 04/07/22  Flexion 56 58  Extension 24 38  Right lateral flexion 38 42  Left lateral flexion 20 28  Right rotation 56 61  Left rotation 71 66   (Blank rows = not tested)  UPPER EXTREMITY ROM:  Active ROM Right eval Left eval Right 04/07/22 Left    04/07/22  Shoulder flexion WNL WNL WNL WNL  Shoulder extension 48 40 48 45  Shoulder abduction 170 144 170 154  Shoulder adduction      Shoulder internal rotation FIR - WNL FIR - WNL    Shoulder external rotation FER - WNL FER - WNL    Elbow flexion      Elbow extension      Wrist flexion      Wrist extension      Wrist ulnar deviation      Wrist radial deviation      Wrist pronation      Wrist supination       (Blank rows = not tested)   LUMBAR ROM:   Active  AROM  eval  Flexion Hands to ankles  Extension 50% limited  Right lateral flexion Hand to lateral knee  Left lateral flexion Hand to  lateral knee  Right rotation 50% limited  Left rotation 50% limited  (Blank rows = not tested)  LOWER EXTREMITY ROM:    Grossly WFL/WNL  UPPER EXTREMITY MMT:  MMT Right eval Left eval Right 04/07/22 Left 04/07/22  Shoulder flexion 4+ 4- * 4+ 4  Shoulder extension 4+ 4 4+ 4+  Shoulder abduction 4 * 4- * 4+ 4+  Shoulder adduction      Shoulder internal rotation 4+ 4 5 4+  Shoulder external rotation 4+ 4- 4+ 4+  Middle trapezius 4 4- 4 4  Lower trapezius      Elbow flexion      Elbow extension      Wrist flexion      Wrist extension      Wrist ulnar deviation      Wrist radial deviation      Wrist pronation      Wrist supination      Grip strength       (Blank rows = not tested)  LOWER EXTREMITY MMT:    MMT Right eval Left eval Right 04/09/22 Left 04/09/22  Hip flexion 4- 4- 4- 4-  Hip extension unable unable 4 4  Hip abduction 4- 4- 4 4-  Hip adduction 4 4- 4+ 4  Hip internal rotation 4 4- 4+ 4+  Hip external rotation 4- 4- 4- 4-  Knee flexion '5 5 5 5  '$ Knee extension '5 5 5 5  '$ Ankle dorsiflexion 4- 4- 4+ 4-  Ankle plantarflexion      Ankle inversion      Ankle eversion       (Blank rows = not tested)   TODAY'S TREATMENT:   04/09/22 THERAPEUTIC EXERCISE: to improve flexibility, strength and mobility.  Verbal and tactile cues throughout for technique.  UBE - L2.0 x 6 min (3' each fwd & back) Hooklying PPT + alt GTB bent-knee fallout 10 x 5" Hooklying PPT + GTB march 10 x 5" S/L L GTB clam 10 x 3" Hooklying bridge + GTB hip ABD isometric 10 x 5" Cat/cow x 10 - limited motion Quadruped 3-way prayer stretch/child's pose 2 x 20 sec Seated 3-way prayer stretch/child's pose 2 x 20 sec  THERAPEUTIC ACTIVITIES: LE MMT   04/07/22 THERAPEUTIC EXERCISE: to improve flexibility, strength and mobility.  Verbal and tactile cues throughout for technique.  NuStep - L5 x 6 min (UE/LE) Mid doorway pec stretch x 30' S/L L open book stretch x 3 each through full arc and bow &  arrow  MANUAL THERAPY: To promote normalized muscle tension, improved flexibility, improved joint mobility, increased ROM, and reduced pain. Skilled palpation and monitoring of soft tissue during DN Trigger Point Dry-Needling  Treatment instructions: Expect mild to moderate muscle soreness. S/S of pneumothorax if dry needled over a lung field, and to seek immediate medical attention should they occur. Patient verbalized understanding of these instructions and education. Patient Consent Given: Yes Education handout provided: Previously provided Muscles treated: L infraspinatus, subscapularis, rhomboids, upper/mid/lower traps, LS, teres group and lats Electrical stimulation performed: Yes Parameters: N/A Treatment response/outcome: Twitch Response Elicited and Palpable Increase in Muscle Length STM/DTM, manual TPR and pin & stretch to muscles addressed with DN  THERAPEUTIC ACTIVITIES: Cervical & UE ROM UE MMT   04/02/22 THERAPEUTIC EXERCISE: Instruction in initial HEP (see below) to improve flexibility, strength and mobility.  Verbal and tactile cues throughout for technique.  UBE L1.0 x 6 min Seated RTB B scap retraction & shoulder ER 10 x 3" Seated RTB B scap retraction & shoulder horiz ABD 10 x 3" - pt noting cramping sensation in R lower traps/thoracic paraspinals Seated R/L QL stretch x 30" Seated rhomboid/lower trap stretch with shoulders rounded and hands clasped pressing forward x 30" 3-way doorway pec stretch at 60/90/120 deg x 30" each Serratus wall slide on towel x 10 Serratus wall slide on towel + YTB isometric x 10 Hooklying PPT 10 x 5" Hooklying L HS stretch with strap 3 x 30"  MANUAL THERAPY: To promote normalized muscle tension, improved flexibility, and reduced pain.  STM and manual TPR to L lower traps and thoracic paraspinals   PATIENT EDUCATION:  Education details: HEP update Person educated: Patient Education method: Explanation, Demonstration, and  Handouts Education comprehension: verbalized understanding  HOME EXERCISE PROGRAM: Access Code: [email or web address] URL: [email or web address] Date: 04/09/2022 Prepared by: Annie Paras  Exercises - Seated Cervical Retraction  - 2 x daily - 7 x weekly - 2 sets - 10 reps - 3-5 sec hold - Seated Scapular Retraction  - 2 x daily - 7 x weekly - 2 sets - 10 reps - 3-5 sec hold - Seated Scapular Retraction with External Rotation  - 2 x daily - 7 x weekly - 2 sets - 10 reps - 3 sec hold - Standing Shoulder Row with Anchored Resistance  -  1 x daily - 7 x weekly - 3 sets - 10 reps - Shoulder Extension with Anchored Resistance  - 1 x daily - 7 x weekly - 3 sets - 10 reps - Thoracic Foam Roll Mobilization Hug  - 1 x daily - 7 x weekly - 2 sets - 10 reps - Sidelying Thoracic Rotation with Open Book  - 1 x daily - 7 x weekly - 2 sets - 10 reps - 5 sec hold - Doorway Pec Stretch at 90 Degrees Abduction  - 2 x daily - 7 x weekly - 3 reps - 30 sec hold - Doorway Pec Stretch at 120 Degrees Abduction  - 2 x daily - 7 x weekly - 3 reps - 30 sec hold - Hooklying Hamstring Stretch with Strap  - 2 x daily - 7 x weekly - 3 reps - 30 sec hold - Supine Posterior Pelvic Tilt  - 1 x daily - 7 x weekly - 2 sets - 10 reps - 5 sec hold - Child's Pose Stretch  - 2 x daily - 7 x weekly - 3 reps - 30 sec hold - Child's Pose with Sidebending  - 2 x daily - 7 x weekly - 2-3 reps - 30 sec hold - Supine March with Resistance Band  - 1 x daily - 3 x weekly - 2 sets - 10 reps - 2-3 sec hold hold - Hooklying Single Leg Bent Knee Fallouts with Resistance  - 1 x daily - 3 x weekly - 2 sets - 10 reps - 3 sec hold - Bridge with Resistance  - 1 x daily - 3 x weekly - 2 sets - 10 reps - 5 sec hold - Clam with Resistance  - 1 x daily - 3 x weekly - 2 sets - 10 reps - 3-5 sec hold  Patient Education - Posture and Body Mechanics   ASSESSMENT:  CLINICAL IMPRESSION:  Suzi reports it took a while for the soreness from the DN to  resolve but reports her pain is better today. She is still frustrated by her inability to sleep well but states it was not pain that kept her awake last night. Reassessment of LE strength reveals some gains but still with L>R proximal weakness. Progressed lumbopelvic and proximal LE strengthening with GTB resisted core strengthening exercises in hook lying as well as mobilization and stretching in bed with patient reporting good stretch during child's pose.  HEP updated to reflect exercise progression at patient request.  OBJECTIVE IMPAIRMENTS: decreased activity tolerance, decreased knowledge of condition, decreased mobility, difficulty walking, decreased ROM, decreased strength, hypomobility, increased fascial restrictions, impaired perceived functional ability, increased muscle spasms, impaired flexibility, impaired UE functional use, improper body mechanics, postural dysfunction, and pain.   ACTIVITY LIMITATIONS: carrying, lifting, bending, sitting, standing, squatting, sleeping, reach over head, hygiene/grooming, and caring for others  PARTICIPATION LIMITATIONS: meal prep, cleaning, laundry, driving, shopping, and community activity  PERSONAL FACTORS: Age, Past/current experiences, Time since onset of injury/illness/exacerbation, and 3+ comorbidities: mild scoliosis; OA - R knee, hands; lumbar DDD; anemia; a-fib; aortic valve disease; HTN; ESRD - HD MWF; hearing loss; anxiety; depression; chronic headaches; insomnia; fatigue  are also affecting patient's functional outcome.   REHAB POTENTIAL: Good  CLINICAL DECISION MAKING: Evolving/moderate complexity  EVALUATION COMPLEXITY: Moderate   GOALS: Goals reviewed with patient? Yes  SHORT TERM GOALS: Target date: 04/02/2022  Patient will be independent with initial HEP to improve outcomes and carryover.  Baseline:  Goal status: MET -  03/24/22  2.  Patient will be able to verbalize and demonstrate proper posture with sitting/standing to  decrease/reduce muscle tightness/reinjury .  Baseline:  Goal status: MET - 04/02/22  LONG TERM GOALS: Target date: 04/30/2022  Patient will be independent with ongoing/advanced HEP for self-management at home.  Baseline:  Goal status: IN PROGRESS  2.  Patient will report 75% improvement in back and periscapular pain pain to improve QOL.  Baseline:  Goal status: IN PROGRESS  3.  Patient to demonstrate ability to achieve and maintain good spinal alignment/posturing and body mechanics needed for daily activities. Baseline:  Goal status: IN PROGRESS  4.  Patient will demonstrate functional pain free cervical and lumbar ROM to perform ADLs.   Baseline:  refer to above ROM tables Goal status: IN PROGRESS  04/07/22 - Cervical ROM improving but still with discomfort during L rotation  5.  Patient will demonstrate improved proximal B LE strength to >/= 4+/5 for improved stability and ease of mobility . Baseline: refer to above LE MMT table Goal status: PARTIALLY MET  04/09/22 - greatest weakness still proximally  6.  Patient will demonstrate improved B shoulder and scapular strength to >/= 4+/5 for functional UE use w/o pain interference.  Baseline: refer to above UE MMT table Goal status: PARTIALLY MET  04/07/22  7. Patient will report </= 21% on NDI to demonstrate improved functional ability with decreased pain interference. Baseline: 18 / 50 = 36.0 % Goal status: IN PROGRESS  8.  Patient will tolerate 3 hours of sitting w/o increased pain to allow for improved activity tolerance during HD treatments. Baseline:  Goal status: IN PROGRESS  04/09/22 - Pt able to sit for 2 hrs before increased pain during HD yesterday  9.  Patient will report </= 25% sleep disturbance due to back or periscapular pain. Baseline: sleep disturbed for up to 3-5 hrs per NDI Goal status: IN PROGRESS  04/07/22 - Pt reports she was unable to fall asleep until after 3 am last night due to pain     PLAN:  PT FREQUENCY:  2x/week  PT DURATION: 8 weeks  PLANNED INTERVENTIONS: Therapeutic exercises, Therapeutic activity, Neuromuscular re-education, Balance training, Gait training, Patient/Family education, Self Care, Joint mobilization, Aquatic Therapy, Dry Needling, Electrical stimulation, Spinal manipulation, Cryotherapy, Moist heat, Taping, Ultrasound, Manual therapy, and Re-evaluation  PLAN FOR NEXT SESSION: Reassess remaining ROM and strength for low back and LEs. Progress postural strengthening with emphasis on rhomboid and mid/low trap strengthening. Progress postural and lumbopelvic flexibility/stretching and strengthening. MT +/- DN as indicated.   Percival Spanish, PT 04/09/2022, 3:34 PM

## 2022-04-10 DIAGNOSIS — N186 End stage renal disease: Secondary | ICD-10-CM | POA: Diagnosis not present

## 2022-04-10 DIAGNOSIS — N2581 Secondary hyperparathyroidism of renal origin: Secondary | ICD-10-CM | POA: Diagnosis not present

## 2022-04-10 DIAGNOSIS — Z23 Encounter for immunization: Secondary | ICD-10-CM | POA: Diagnosis not present

## 2022-04-10 DIAGNOSIS — D631 Anemia in chronic kidney disease: Secondary | ICD-10-CM | POA: Diagnosis not present

## 2022-04-10 DIAGNOSIS — D509 Iron deficiency anemia, unspecified: Secondary | ICD-10-CM | POA: Diagnosis not present

## 2022-04-13 DIAGNOSIS — D509 Iron deficiency anemia, unspecified: Secondary | ICD-10-CM | POA: Diagnosis not present

## 2022-04-13 DIAGNOSIS — Z7901 Long term (current) use of anticoagulants: Secondary | ICD-10-CM | POA: Diagnosis not present

## 2022-04-13 DIAGNOSIS — D631 Anemia in chronic kidney disease: Secondary | ICD-10-CM | POA: Diagnosis not present

## 2022-04-13 DIAGNOSIS — N2581 Secondary hyperparathyroidism of renal origin: Secondary | ICD-10-CM | POA: Diagnosis not present

## 2022-04-13 DIAGNOSIS — Z23 Encounter for immunization: Secondary | ICD-10-CM | POA: Diagnosis not present

## 2022-04-13 DIAGNOSIS — N186 End stage renal disease: Secondary | ICD-10-CM | POA: Diagnosis not present

## 2022-04-14 ENCOUNTER — Ambulatory Visit: Payer: Medicare HMO

## 2022-04-14 ENCOUNTER — Ambulatory Visit: Payer: Medicare HMO | Admitting: Family Medicine

## 2022-04-14 DIAGNOSIS — M62830 Muscle spasm of back: Secondary | ICD-10-CM

## 2022-04-14 DIAGNOSIS — R293 Abnormal posture: Secondary | ICD-10-CM | POA: Diagnosis not present

## 2022-04-14 DIAGNOSIS — M6281 Muscle weakness (generalized): Secondary | ICD-10-CM

## 2022-04-14 DIAGNOSIS — M546 Pain in thoracic spine: Secondary | ICD-10-CM | POA: Diagnosis not present

## 2022-04-14 DIAGNOSIS — M5459 Other low back pain: Secondary | ICD-10-CM | POA: Diagnosis not present

## 2022-04-14 NOTE — Therapy (Signed)
OUTPATIENT PHYSICAL THERAPY TREATMENT   Patient Name: Jillian Hayes MRN: [id number] DOB:[birth year], 77 y.o., female Today's Date: 04/14/2022  END OF SESSION:  PT End of Session - 04/14/22 1337     Visit Number 9    Date for PT Re-Evaluation 04/30/22    Authorization Type [employer]    Progress Note Due on Visit 10    PT Start Time 1315    PT Stop Time 1359    PT Time Calculation (min) 44 min    Activity Tolerance Patient tolerated treatment well    Behavior During Therapy [facility] for tasks assessed/performed                  Past Medical History:  Diagnosis Date   Advanced care planning/counseling discussion 06/10/2014   05/31/2014 patient presents copy of HCP and Living Will   Anemia    iron deficiency   Anxiety    Arthritis    "in hands"   Atrial fibrillation ([place])    Benign fundic gland polyps of stomach    Bladder polyps 06/25/2010   Chronic headaches [id number]   Complication of anesthesia    "woke up at the end of a cyst removal surgery in 1991"   Depression 1991   hospitalized   Diverticulosis    ESRD (end stage renal disease) ([place]) 11/09/2015   HD on MWF   External prolapsed hemorrhoids    History of chicken pox 06/25/2010   Hypercalcemia 02/18/2014   Hypertension    Insomnia 06/24/2010   Multiple chemical sensitivity syndrome 06/25/2010   Proteinuria 02/18/2014   Renal insufficiency 03/26/2011   Valvular heart disease 04/28/2016   Past Surgical History:  Procedure Laterality Date   AV FISTULA PLACEMENT Left    x4   COLONOSCOPY  2014   cyst on left breast removed Left 1991   benign   ESOPHAGOGASTRODUODENOSCOPY (EGD) WITH ESOPHAGEAL DILATION  2014   LAPAROSCOPIC BILATERAL SALPINGO OOPHERECTOMY Bilateral 08/20/2020   Procedure: LAPAROSCOPIC BILATERAL SALPINGO OOPHORECTOMY;  Surgeon: Megan Salon, MD;  Location: [place];  Service: Gynecology;  Laterality: Bilateral;   NASAL SEPTUM SURGERY  1986   rhinoplasty   polyps on bladder removed   1972   benign   RIGHT HEART CATH N/A 04/10/2021   Procedure: RIGHT HEART CATH;  Surgeon: Larey Dresser, MD;  Location: [place] [facility];  Service: Cardiovascular;  Laterality: N/A;   TONSILLECTOMY  1962   Patient Active Problem List   Diagnosis Date Noted   Pulmonary hypertension, primary ([place]) 03/09/2021   Low TSH level 03/09/2021   Right knee pain 03/06/2021   Primary osteoarthritis of right knee 02/25/2021   Adnexal mass    A-V fistula ([place]) 08/03/2019   Nonrheumatic mitral valve regurgitation 06/12/2019   Edema 04/09/2019   Drug-induced constipation 10/18/2018   External prolapsed hemorrhoids 09/25/2018   Hyperphosphatemia 03/09/2018   Leg swelling 03/03/2018   Long term (current) use of anticoagulants 11/01/2017   Atrial fibrillation ([place]) 10/25/2017   Hyperparathyroidism, unspecified ([employer]) 05/04/2017   DDD (degenerative disc disease), lumbar 04/07/2017   Hearing loss 01/04/2017   Hyperlipidemia 01/04/2017   Arthritis of fingers of both hands 10/01/2016   Chronic rhinitis 10/01/2016   Nonrheumatic aortic valve insufficiency 09/29/2016   Bruit 09/29/2016   Palpitation 09/22/2016   Aortic valve disease 04/28/2016   Anemia in ESRD (end-stage renal disease) ([place]) 03/03/2016   Sun-damaged skin 01/21/2016   ESRD (end stage renal disease) ([place]) 11/09/2015   Advanced care planning/counseling discussion  06/10/2014   Pedal edema 03/28/2014   Proteinuria 02/18/2014   Hypercalcemia 02/18/2014   Abnormal EKG 01/22/2014   HTN (hypertension) 01/22/2014   Dermatitis 06/30/2013   Abnormal thyroid function test 06/25/2013   Cervical cancer screening 06/10/2012   Preventative health care 11/09/2011   Multiple chemical sensitivity syndrome 06/25/2010   History of chicken pox 06/25/2010   Bladder polyps 06/25/2010   History of cardiac dysrhythmia 06/25/2010   Insomnia 06/24/2010   Fatigue 06/24/2010   Chronic headaches 06/24/2010   Allergy    Anemia    Depression     PCP:  [name], MD   REFERRING PROVIDER: Terrilyn Saver, NP   REFERRING DIAG: M54.9 (ICD-10-CM) - Upper back pain   THERAPY DIAG:  Pain in thoracic spine  Abnormal posture  Other low back pain  Muscle weakness (generalized)  Muscle spasm of back  RATIONALE FOR EVALUATION AND TREATMENT: Rehabilitation  ONSET DATE: late Nov 2023  NEXT MD VISIT: 05/05/22 with PCP   SUBJECTIVE:                                                                                                                                                                                                         SUBJECTIVE STATEMENT:   Pt feels like she is making small improvements, sometimes she will get stabbing pain in the upper back.  PAIN: Are you having pain? Yes: NPRS scale: 3/10 Pain location: middle of L shoulder blade Pain description: sore Aggravating factors: at night worse, only sleeps 2 hours, dishes, cooking, getting things in/out of dryer, driving Relieving factors: deep massage, icy/hot, DN  Are you having pain? Yes: NPRS scale:  3/10 Pain location: L low back just above waist Pain description: stiff, grabs periodically Aggravating factors: getting into the car, bending over at sink, washer and dryer Relieving factors: stretches, ice  PERTINENT HISTORY:  mild scoliosis; OA - R knee, hands; lumbar DDD; anemia; a-fib; aortic valve disease; HTN; ESRD - HD MWF; hearing loss; anxiety; depression; chronic headaches; insomnia; fatigue  PRECAUTIONS: Other: HD MWF - fistula in L upper arm  WEIGHT BEARING RESTRICTIONS: No  FALLS:  Has patient fallen in last 6 months? No  LIVING ENVIRONMENT: Lives with: lives with their spouse and lives with their daughter Lives in: House/apartment Stairs: Yes: Internal: 14 steps; on right going up (dtr lives upstairs, she rarely goes up) Has following equipment at home: shower chair  OCCUPATION: Retired  PLOF: Independent and Leisure: reading on Kindle,  walking when she has the energy ~2x/wk or when shopping  PATIENT GOALS: "To be able to sleep w/o back hurting. To be able to sit through HD and do things around the house w/o pain."   OBJECTIVE:   DIAGNOSTIC FINDINGS:  03/03/22 - Thoracic spine x-ray: IMPRESSION: 1. No acute fracture. 2. Mild degenerative changes in the thoracic spine.  PATIENT SURVEYS:  NDI 18 / 50 = 36.0 %  COGNITION:  Overall cognitive status: Within functional limits for tasks assessed    SENSATION: WFL  MUSCLE LENGTH: Hamstrings: mild/mod tight L>R ITB: mod tight R Piriformis: mod tight R Hip flexors: mod tight R, mild tight L Quads: mod tight R, mild tight L Heelcord: NT  POSTURE:  rounded shoulders, forward head, increased thoracic kyphosis, and mils scoliosis  PALPATION: TTP with increased muscle tension and taut bands in R>L thoracolumbar paraspinals, L >R cervicothoracic paraspinals and periscapular muscles   CERVICAL ROM:   Active ROM AROM (deg) eval AROM 04/07/22  Flexion 56 58  Extension 24 38  Right lateral flexion 38 42  Left lateral flexion 20 28  Right rotation 56 61  Left rotation 71 66   (Blank rows = not tested)  UPPER EXTREMITY ROM:  Active ROM Right eval Left eval Right 04/07/22 Left    04/07/22  Shoulder flexion WNL WNL WNL WNL  Shoulder extension 48 40 48 45  Shoulder abduction 170 144 170 154  Shoulder adduction      Shoulder internal rotation FIR - WNL FIR - WNL    Shoulder external rotation FER - WNL FER - WNL    Elbow flexion      Elbow extension      Wrist flexion      Wrist extension      Wrist ulnar deviation      Wrist radial deviation      Wrist pronation      Wrist supination       (Blank rows = not tested)   LUMBAR ROM:   Active  AROM  eval  Flexion Hands to ankles  Extension 50% limited  Right lateral flexion Hand to lateral knee  Left lateral flexion Hand to lateral knee  Right rotation 50% limited  Left rotation 50% limited  (Blank rows =  not tested)  LOWER EXTREMITY ROM:    Grossly WFL/WNL  UPPER EXTREMITY MMT:  MMT Right eval Left eval Right 04/07/22 Left 04/07/22  Shoulder flexion 4+ 4- * 4+ 4  Shoulder extension 4+ 4 4+ 4+  Shoulder abduction 4 * 4- * 4+ 4+  Shoulder adduction      Shoulder internal rotation 4+ 4 5 4+  Shoulder external rotation 4+ 4- 4+ 4+  Middle trapezius 4 4- 4 4  Lower trapezius      Elbow flexion      Elbow extension      Wrist flexion      Wrist extension      Wrist ulnar deviation      Wrist radial deviation      Wrist pronation      Wrist supination      Grip strength       (Blank rows = not tested)  LOWER EXTREMITY MMT:    MMT Right eval Left eval Right 04/09/22 Left 04/09/22  Hip flexion 4- 4- 4- 4-  Hip extension unable unable 4 4  Hip abduction 4- 4- 4 4-  Hip adduction 4 4- 4+ 4  Hip internal rotation 4 4- 4+ 4+  Hip external rotation 4- 4- 4- 4-  Knee flexion '5 5 5 5  '$ Knee extension '5 5 5 5  '$ Ankle dorsiflexion 4- 4- 4+ 4-  Ankle plantarflexion      Ankle inversion      Ankle eversion       (Blank rows = not tested)   TODAY'S TREATMENT:  04/14/22 THERAPEUTIC EXERCISE: to improve flexibility, strength and mobility.  Verbal and tactile cues throughout for technique.  UBE - L1.5 x 6 min (3' each fwd & back) Standing horizontal ABD YTB x 10  Standing shoulder flexion with YTB x 10 bil Standing wall angels long arc x 10; short arc x 10 Standing thoracolumbar ext 10x Seated rhomboid stretch hands clasped in front of patient with fwd bend 3x15" Prone shoulder flexion with lower trap setting x 10 bil  Manual Therapy: to decrease muscle spasm, pain and improve mobility.  IASTM to thoracic PS with foam roll, STM to R LS  04/09/22 THERAPEUTIC EXERCISE: to improve flexibility, strength and mobility.  Verbal and tactile cues throughout for technique.  UBE - L2.0 x 6 min (3' each fwd & back) Hooklying PPT + alt GTB bent-knee fallout 10 x 5" Hooklying PPT + GTB march 10 x  5" S/L L GTB clam 10 x 3" Hooklying bridge + GTB hip ABD isometric 10 x 5" Cat/cow x 10 - limited motion Quadruped 3-way prayer stretch/child's pose 2 x 20 sec Seated 3-way prayer stretch/child's pose 2 x 20 sec  THERAPEUTIC ACTIVITIES: LE MMT   04/07/22 THERAPEUTIC EXERCISE: to improve flexibility, strength and mobility.  Verbal and tactile cues throughout for technique.  NuStep - L5 x 6 min (UE/LE) Mid doorway pec stretch x 30' S/L L open book stretch x 3 each through full arc and bow & arrow  MANUAL THERAPY: To promote normalized muscle tension, improved flexibility, improved joint mobility, increased ROM, and reduced pain. Skilled palpation and monitoring of soft tissue during DN Trigger Point Dry-Needling  Treatment instructions: Expect mild to moderate muscle soreness. S/S of pneumothorax if dry needled over a lung field, and to seek immediate medical attention should they occur. Patient verbalized understanding of these instructions and education. Patient Consent Given: Yes Education handout provided: Previously provided Muscles treated: L infraspinatus, subscapularis, rhomboids, upper/mid/lower traps, LS, teres group and lats Electrical stimulation performed: Yes Parameters: N/A Treatment response/outcome: Twitch Response Elicited and Palpable Increase in Muscle Length STM/DTM, manual TPR and pin & stretch to muscles addressed with DN  THERAPEUTIC ACTIVITIES: Cervical & UE ROM UE MMT   04/02/22 THERAPEUTIC EXERCISE: Instruction in initial HEP (see below) to improve flexibility, strength and mobility.  Verbal and tactile cues throughout for technique.  UBE L1.0 x 6 min Seated RTB B scap retraction & shoulder ER 10 x 3" Seated RTB B scap retraction & shoulder horiz ABD 10 x 3" - pt noting cramping sensation in R lower traps/thoracic paraspinals Seated R/L QL stretch x 30" Seated rhomboid/lower trap stretch with shoulders rounded and hands clasped pressing forward x  30" 3-way doorway pec stretch at 60/90/120 deg x 30" each Serratus wall slide on towel x 10 Serratus wall slide on towel + YTB isometric x 10 Hooklying PPT 10 x 5" Hooklying L HS stretch with strap 3 x 30"  MANUAL THERAPY: To promote normalized muscle tension, improved flexibility, and reduced pain.  STM and manual TPR to L lower traps and thoracic paraspinals   PATIENT EDUCATION:  Education details: HEP update Person educated: Patient Education method: Explanation, Demonstration, and Handouts Education comprehension: verbalized understanding  HOME EXERCISE PROGRAM: Access Code: [email or web address] URL: [email or web address] Date: 04/09/2022 Prepared by: Annie Paras  Exercises - Seated Cervical Retraction  - 2 x daily - 7 x weekly - 2 sets - 10 reps - 3-5 sec hold - Seated Scapular Retraction  - 2 x daily - 7 x weekly - 2 sets - 10 reps - 3-5 sec hold - Seated Scapular Retraction with External Rotation  - 2 x daily - 7 x weekly - 2 sets - 10 reps - 3 sec hold - Standing Shoulder Row with Anchored Resistance  - 1 x daily - 7 x weekly - 3 sets - 10 reps - Shoulder Extension with Anchored Resistance  - 1 x daily - 7 x weekly - 3 sets - 10 reps - Thoracic Foam Roll Mobilization Hug  - 1 x daily - 7 x weekly - 2 sets - 10 reps - Sidelying Thoracic Rotation with Open Book  - 1 x daily - 7 x weekly - 2 sets - 10 reps - 5 sec hold - Doorway Pec Stretch at 90 Degrees Abduction  - 2 x daily - 7 x weekly - 3 reps - 30 sec hold - Doorway Pec Stretch at 120 Degrees Abduction  - 2 x daily - 7 x weekly - 3 reps - 30 sec hold - Hooklying Hamstring Stretch with Strap  - 2 x daily - 7 x weekly - 3 reps - 30 sec hold - Supine Posterior Pelvic Tilt  - 1 x daily - 7 x weekly - 2 sets - 10 reps - 5 sec hold - Child's Pose Stretch  - 2 x daily - 7 x weekly - 3 reps - 30 sec hold - Child's Pose with Sidebending  - 2 x daily - 7 x weekly - 2-3 reps - 30 sec hold - Supine March with Resistance Band  -  1 x daily - 3 x weekly - 2 sets - 10 reps - 2-3 sec hold hold - Hooklying Single Leg Bent Knee Fallouts with Resistance  - 1 x daily - 3 x weekly - 2 sets - 10 reps - 3 sec hold - Bridge with Resistance  - 1 x daily - 3 x weekly - 2 sets - 10 reps - 5 sec hold - Clam with Resistance  - 1 x daily - 3 x weekly - 2 sets - 10 reps - 3-5 sec hold  Patient Education - Posture and Body Mechanics   ASSESSMENT:  CLINICAL IMPRESSION:  Advanced patient through strengthening exercises to improve postural alignment and scapular stability. Instruction given to tamper further down HEP if need be d/t complaints of aching/soreness after doing HEP. Cues required in prone to set low traps before lifting shoulder. Instruction provided to set shoulders before mvmt with the wall angels. She continues to have reports of mild pain radiation across shoulder blades but better than before, she would continue to benefit from skilled therapy.   OBJECTIVE IMPAIRMENTS: decreased activity tolerance, decreased knowledge of condition, decreased mobility, difficulty walking, decreased ROM, decreased strength, hypomobility, increased fascial restrictions, impaired perceived functional ability, increased muscle spasms, impaired flexibility, impaired UE functional use, improper body mechanics, postural dysfunction, and pain.   ACTIVITY LIMITATIONS: carrying, lifting, bending, sitting, standing, squatting, sleeping, reach over head, hygiene/grooming, and caring for others  PARTICIPATION LIMITATIONS: meal prep, cleaning, laundry, driving, shopping, and community activity  PERSONAL FACTORS: Age, Past/current experiences, Time since onset of injury/illness/exacerbation, and 3+ comorbidities: mild scoliosis; OA - R knee, hands; lumbar  DDD; anemia; a-fib; aortic valve disease; HTN; ESRD - HD MWF; hearing loss; anxiety; depression; chronic headaches; insomnia; fatigue  are also affecting patient's functional outcome.   REHAB POTENTIAL:  Good  CLINICAL DECISION MAKING: Evolving/moderate complexity  EVALUATION COMPLEXITY: Moderate   GOALS: Goals reviewed with patient? Yes  SHORT TERM GOALS: Target date: 04/02/2022  Patient will be independent with initial HEP to improve outcomes and carryover.  Baseline:  Goal status: MET - 03/24/22  2.  Patient will be able to verbalize and demonstrate proper posture with sitting/standing to decrease/reduce muscle tightness/reinjury .  Baseline:  Goal status: MET - 04/02/22  LONG TERM GOALS: Target date: 04/30/2022  Patient will be independent with ongoing/advanced HEP for self-management at home.  Baseline:  Goal status: IN PROGRESS  2.  Patient will report 75% improvement in back and periscapular pain pain to improve QOL.  Baseline:  Goal status: IN PROGRESS  3.  Patient to demonstrate ability to achieve and maintain good spinal alignment/posturing and body mechanics needed for daily activities. Baseline:  Goal status: IN PROGRESS  4.  Patient will demonstrate functional pain free cervical and lumbar ROM to perform ADLs.   Baseline:  refer to above ROM tables Goal status: IN PROGRESS  04/07/22 - Cervical ROM improving but still with discomfort during L rotation  5.  Patient will demonstrate improved proximal B LE strength to >/= 4+/5 for improved stability and ease of mobility . Baseline: refer to above LE MMT table Goal status: PARTIALLY MET  04/09/22 - greatest weakness still proximally  6.  Patient will demonstrate improved B shoulder and scapular strength to >/= 4+/5 for functional UE use w/o pain interference.  Baseline: refer to above UE MMT table Goal status: PARTIALLY MET  04/07/22  7. Patient will report </= 21% on NDI to demonstrate improved functional ability with decreased pain interference. Baseline: 18 / 50 = 36.0 % Goal status: IN PROGRESS  8.  Patient will tolerate 3 hours of sitting w/o increased pain to allow for improved activity tolerance during HD  treatments. Baseline:  Goal status: IN PROGRESS  04/09/22 - Pt able to sit for 2 hrs before increased pain during HD yesterday  9.  Patient will report </= 25% sleep disturbance due to back or periscapular pain. Baseline: sleep disturbed for up to 3-5 hrs per NDI Goal status: IN PROGRESS  04/07/22 - Pt reports she was unable to fall asleep until after 3 am last night due to pain     PLAN:  PT FREQUENCY: 2x/week  PT DURATION: 8 weeks  PLANNED INTERVENTIONS: Therapeutic exercises, Therapeutic activity, Neuromuscular re-education, Balance training, Gait training, Patient/Family education, Self Care, Joint mobilization, Aquatic Therapy, Dry Needling, Electrical stimulation, Spinal manipulation, Cryotherapy, Moist heat, Taping, Ultrasound, Manual therapy, and Re-evaluation  PLAN FOR NEXT SESSION: Reassess remaining ROM and strength for low back and LEs. Progress postural strengthening with emphasis on rhomboid and mid/low trap strengthening. Progress postural and lumbopelvic flexibility/stretching and strengthening. MT +/- DN as indicated.   Artist Pais, PTA 04/14/2022, 3:25 PM

## 2022-04-15 DIAGNOSIS — Z23 Encounter for immunization: Secondary | ICD-10-CM | POA: Diagnosis not present

## 2022-04-15 DIAGNOSIS — D509 Iron deficiency anemia, unspecified: Secondary | ICD-10-CM | POA: Diagnosis not present

## 2022-04-15 DIAGNOSIS — D631 Anemia in chronic kidney disease: Secondary | ICD-10-CM | POA: Diagnosis not present

## 2022-04-15 DIAGNOSIS — N186 End stage renal disease: Secondary | ICD-10-CM | POA: Diagnosis not present

## 2022-04-15 DIAGNOSIS — N2581 Secondary hyperparathyroidism of renal origin: Secondary | ICD-10-CM | POA: Diagnosis not present

## 2022-04-17 DIAGNOSIS — D631 Anemia in chronic kidney disease: Secondary | ICD-10-CM | POA: Diagnosis not present

## 2022-04-17 DIAGNOSIS — N2581 Secondary hyperparathyroidism of renal origin: Secondary | ICD-10-CM | POA: Diagnosis not present

## 2022-04-17 DIAGNOSIS — D509 Iron deficiency anemia, unspecified: Secondary | ICD-10-CM | POA: Diagnosis not present

## 2022-04-17 DIAGNOSIS — N186 End stage renal disease: Secondary | ICD-10-CM | POA: Diagnosis not present

## 2022-04-17 DIAGNOSIS — Z23 Encounter for immunization: Secondary | ICD-10-CM | POA: Diagnosis not present

## 2022-04-20 DIAGNOSIS — N2581 Secondary hyperparathyroidism of renal origin: Secondary | ICD-10-CM | POA: Diagnosis not present

## 2022-04-20 DIAGNOSIS — Z23 Encounter for immunization: Secondary | ICD-10-CM | POA: Diagnosis not present

## 2022-04-20 DIAGNOSIS — Z7901 Long term (current) use of anticoagulants: Secondary | ICD-10-CM | POA: Diagnosis not present

## 2022-04-20 DIAGNOSIS — D631 Anemia in chronic kidney disease: Secondary | ICD-10-CM | POA: Diagnosis not present

## 2022-04-20 DIAGNOSIS — D509 Iron deficiency anemia, unspecified: Secondary | ICD-10-CM | POA: Diagnosis not present

## 2022-04-20 DIAGNOSIS — N186 End stage renal disease: Secondary | ICD-10-CM | POA: Diagnosis not present

## 2022-04-21 ENCOUNTER — Ambulatory Visit: Payer: Medicare HMO

## 2022-04-21 DIAGNOSIS — M5459 Other low back pain: Secondary | ICD-10-CM | POA: Diagnosis not present

## 2022-04-21 DIAGNOSIS — R293 Abnormal posture: Secondary | ICD-10-CM

## 2022-04-21 DIAGNOSIS — M6281 Muscle weakness (generalized): Secondary | ICD-10-CM | POA: Diagnosis not present

## 2022-04-21 DIAGNOSIS — M546 Pain in thoracic spine: Secondary | ICD-10-CM | POA: Diagnosis not present

## 2022-04-21 DIAGNOSIS — M62830 Muscle spasm of back: Secondary | ICD-10-CM

## 2022-04-21 DIAGNOSIS — R69 Illness, unspecified: Secondary | ICD-10-CM | POA: Diagnosis not present

## 2022-04-21 NOTE — Therapy (Addendum)
OUTPATIENT PHYSICAL THERAPY TREATMENT  Progress Note  Reporting Period 03/05/22 to 04/21/22  See note below for Objective Data and Assessment of Progress/Goals.     Patient Name: Jillian Hayes MRN: [id number] DOB:[birth year], 77 y.o., female Today's Date: 04/21/2022  END OF SESSION:  PT End of Session - 04/21/22 1446     Visit Number 10    Date for PT Re-Evaluation 04/30/22    Authorization Type [employer]    Progress Note Due on Visit 10    PT Start Time 1405    PT Stop Time 1446    PT Time Calculation (min) 41 min    Activity Tolerance Patient tolerated treatment well    Behavior During Therapy [facility] for tasks assessed/performed                   Past Medical History:  Diagnosis Date   Advanced care planning/counseling discussion 06/10/2014   05/31/2014 patient presents copy of HCP and Living Will   Anemia    iron deficiency   Anxiety    Arthritis    "in hands"   Atrial fibrillation ([place])    Benign fundic gland polyps of stomach    Bladder polyps 06/25/2010   Chronic headaches [id number]   Complication of anesthesia    "woke up at the end of a cyst removal surgery in 1991"   Depression 1991   hospitalized   Diverticulosis    ESRD (end stage renal disease) ([place]) 11/09/2015   HD on MWF   External prolapsed hemorrhoids    History of chicken pox 06/25/2010   Hypercalcemia 02/18/2014   Hypertension    Insomnia 06/24/2010   Multiple chemical sensitivity syndrome 06/25/2010   Proteinuria 02/18/2014   Renal insufficiency 03/26/2011   Valvular heart disease 04/28/2016   Past Surgical History:  Procedure Laterality Date   AV FISTULA PLACEMENT Left    x4   COLONOSCOPY  2014   cyst on left breast removed Left 1991   benign   ESOPHAGOGASTRODUODENOSCOPY (EGD) WITH ESOPHAGEAL DILATION  2014   LAPAROSCOPIC BILATERAL SALPINGO OOPHERECTOMY Bilateral 08/20/2020   Procedure: LAPAROSCOPIC BILATERAL SALPINGO OOPHORECTOMY;  Surgeon: Megan Salon, MD;  Location:  [place];  Service: Gynecology;  Laterality: Bilateral;   NASAL SEPTUM SURGERY  1986   rhinoplasty   polyps on bladder removed  1972   benign   RIGHT HEART CATH N/A 04/10/2021   Procedure: RIGHT HEART CATH;  Surgeon: Larey Dresser, MD;  Location: [place] [facility];  Service: Cardiovascular;  Laterality: N/A;   TONSILLECTOMY  1962   Patient Active Problem List   Diagnosis Date Noted   Pulmonary hypertension, primary ([place]) 03/09/2021   Low TSH level 03/09/2021   Right knee pain 03/06/2021   Primary osteoarthritis of right knee 02/25/2021   Adnexal mass    A-V fistula ([place]) 08/03/2019   Nonrheumatic mitral valve regurgitation 06/12/2019   Edema 04/09/2019   Drug-induced constipation 10/18/2018   External prolapsed hemorrhoids 09/25/2018   Hyperphosphatemia 03/09/2018   Leg swelling 03/03/2018   Long term (current) use of anticoagulants 11/01/2017   Atrial fibrillation ([place]) 10/25/2017   Hyperparathyroidism, unspecified ([place]) 05/04/2017   DDD (degenerative disc disease), lumbar 04/07/2017   Hearing loss 01/04/2017   Hyperlipidemia 01/04/2017   Arthritis of fingers of both hands 10/01/2016   Chronic rhinitis 10/01/2016   Nonrheumatic aortic valve insufficiency 09/29/2016   Bruit 09/29/2016   Palpitation 09/22/2016   Aortic valve disease 04/28/2016   Anemia in ESRD (end-stage renal  disease) ([place]) 03/03/2016   Sun-damaged skin 01/21/2016   ESRD (end stage renal disease) ([place]) 11/09/2015   Advanced care planning/counseling discussion 06/10/2014   Pedal edema 03/28/2014   Proteinuria 02/18/2014   Hypercalcemia 02/18/2014   Abnormal EKG 01/22/2014   HTN (hypertension) 01/22/2014   Dermatitis 06/30/2013   Abnormal thyroid function test 06/25/2013   Cervical cancer screening 06/10/2012   Preventative health care 11/09/2011   Multiple chemical sensitivity syndrome 06/25/2010   History of chicken pox 06/25/2010   Bladder polyps 06/25/2010   History of cardiac dysrhythmia  06/25/2010   Insomnia 06/24/2010   Fatigue 06/24/2010   Chronic headaches 06/24/2010   Allergy    Anemia    Depression     PCP: [name], MD   REFERRING PROVIDER: Terrilyn Saver, NP   REFERRING DIAG: M54.9 (ICD-10-CM) - Upper back pain   THERAPY DIAG:  Pain in thoracic spine  Abnormal posture  Other low back pain  Muscle weakness (generalized)  Muscle spasm of back  RATIONALE FOR EVALUATION AND TREATMENT: Rehabilitation  ONSET DATE: late Nov 2023  NEXT MD VISIT: 05/05/22 with PCP   SUBJECTIVE:                                                                                                                                                                                                         SUBJECTIVE STATEMENT:   Feeling better today  PAIN: Are you having pain? Yes: NPRS scale: 2/10 Pain location: middle of L shoulder blade Pain description: sore Aggravating factors: at night worse, only sleeps 2 hours, dishes, cooking, getting things in/out of dryer, driving Relieving factors: deep massage, icy/hot, DN  Are you having pain? Yes: NPRS scale:  3/10 Pain location: L low back just above waist Pain description: stiff, grabs periodically Aggravating factors: getting into the car, bending over at sink, washer and dryer Relieving factors: stretches, ice  PERTINENT HISTORY:  mild scoliosis; OA - R knee, hands; lumbar DDD; anemia; a-fib; aortic valve disease; HTN; ESRD - HD MWF; hearing loss; anxiety; depression; chronic headaches; insomnia; fatigue  PRECAUTIONS: Other: HD MWF - fistula in L upper arm  WEIGHT BEARING RESTRICTIONS: No  FALLS:  Has patient fallen in last 6 months? No  LIVING ENVIRONMENT: Lives with: lives with their spouse and lives with their daughter Lives in: House/apartment Stairs: Yes: Internal: 14 steps; on right going up (dtr lives upstairs, she rarely goes up) Has following equipment at home: shower chair  OCCUPATION:  Retired  PLOF: Independent and Leisure: reading on [place], walking when she  has the energy ~2x/wk or when shopping  PATIENT GOALS: "To be able to sleep w/o back hurting. To be able to sit through HD and do things around the house w/o pain."   OBJECTIVE:   DIAGNOSTIC FINDINGS:  03/03/22 - Thoracic spine x-ray: IMPRESSION: 1. No acute fracture. 2. Mild degenerative changes in the thoracic spine.  PATIENT SURVEYS:  NDI 18 / 50 = 36.0 %  COGNITION:  Overall cognitive status: Within functional limits for tasks assessed    SENSATION: WFL  MUSCLE LENGTH: Hamstrings: mild/mod tight L>R ITB: mod tight R Piriformis: mod tight R Hip flexors: mod tight R, mild tight L Quads: mod tight R, mild tight L Heelcord: NT  POSTURE:  rounded shoulders, forward head, increased thoracic kyphosis, and mils scoliosis  PALPATION: TTP with increased muscle tension and taut bands in R>L thoracolumbar paraspinals, L >R cervicothoracic paraspinals and periscapular muscles   CERVICAL ROM:   Active ROM AROM (deg) eval AROM 04/07/22 AROM 04/21/22  Flexion 56 58 WFL  Extension 24 38 WFL  Right lateral flexion 38 42 WFL- discomfort on Lside  Left lateral flexion 20 28 WFL- discomfort L side  Right rotation 56 61 WFL- discomfort L side  Left rotation 71 66 WFL- discomfort on L side   (Blank rows = not tested)  UPPER EXTREMITY ROM:  Active ROM Right eval Left eval Right 04/07/22 Left    04/07/22  Shoulder flexion WNL WNL WNL WNL  Shoulder extension 48 40 48 45  Shoulder abduction 170 144 170 154  Shoulder adduction      Shoulder internal rotation FIR - WNL FIR - WNL    Shoulder external rotation FER - WNL FER - WNL    Elbow flexion      Elbow extension      Wrist flexion      Wrist extension      Wrist ulnar deviation      Wrist radial deviation      Wrist pronation      Wrist supination       (Blank rows = not tested)   LUMBAR ROM:   Active  AROM  eval AROM 04/21/22  Flexion Hands to  ankles WFL  Extension 50% limited Limited 25%  Right lateral flexion Hand to lateral knee Lateral knee  Left lateral flexion Hand to lateral knee Tight - hand to lateral knee  Right rotation 50% limited WFL  Left rotation 50% limited WFL  (Blank rows = not tested)  LOWER EXTREMITY ROM:    Grossly WFL/WNL  UPPER EXTREMITY MMT:  MMT Right eval Left eval Right 04/07/22 Left 04/07/22 Rt and Lt 04/21/22  Shoulder flexion 4+ 4- * 4+ 4 4+  Shoulder extension 4+ 4 4+ 4+   Shoulder abduction 4 * 4- * 4+ 4+ 4+  Shoulder adduction       Shoulder internal rotation 4+ 4 5 4+ 4+  Shoulder external rotation 4+ 4- 4+ 4+ 4+  Middle trapezius 4 4- 4 4 4+  Lower trapezius       Elbow flexion       Elbow extension       Wrist flexion       Wrist extension       Wrist ulnar deviation       Wrist radial deviation       Wrist pronation       Wrist supination       Grip strength        (Blank  rows = not tested)  LOWER EXTREMITY MMT:    MMT Right eval Left eval Right 04/09/22 Left 04/09/22  Hip flexion 4- 4- 4- 4-  Hip extension unable unable 4 4  Hip abduction 4- 4- 4 4-  Hip adduction 4 4- 4+ 4  Hip internal rotation 4 4- 4+ 4+  Hip external rotation 4- 4- 4- 4-  Knee flexion 5 5 5 5   Knee extension 5 5 5 5   Ankle dorsiflexion 4- 4- 4+ 4-  Ankle plantarflexion      Ankle inversion      Ankle eversion       (Blank rows = not tested)   TODAY'S TREATMENT:   04/21/22 THERAPEUTIC EXERCISE: to improve flexibility, strength and mobility.  Verbal and tactile cues throughout for technique.  UBE - L2.0 x 6 min (3' each fwd & back) Supine serratus punch 2# 2x10 bil Horiz ABD RTB supine x 10  Alt diagonals YTB x 10 supine Shoulder strength, cervical and lumbar AROM assessed  Manual therapy: IASTM to thoracic PS, rhomboids with foam roll   04/14/22 THERAPEUTIC EXERCISE: to improve flexibility, strength and mobility.  Verbal and tactile cues throughout for technique.  UBE - L1.5 x 6 min (3'  each fwd & back) Standing horizontal ABD YTB x 10  Standing shoulder flexion with YTB x 10 bil Standing wall angels long arc x 10; short arc x 10 Standing thoracolumbar ext 10x Seated rhomboid stretch hands clasped in front of patient with fwd bend 3x15" Prone shoulder flexion with lower trap setting x 10 bil  Manual Therapy: to decrease muscle spasm, pain and improve mobility.  IASTM to thoracic PS with foam roll, STM to R LS   04/09/22 THERAPEUTIC EXERCISE: to improve flexibility, strength and mobility.  Verbal and tactile cues throughout for technique.  UBE - L2.0 x 6 min (3' each fwd & back) Hooklying PPT + alt GTB bent-knee fallout 10 x 5" Hooklying PPT + GTB march 10 x 5" S/L L GTB clam 10 x 3" Hooklying bridge + GTB hip ABD isometric 10 x 5" Cat/cow x 10 - limited motion Quadruped 3-way prayer stretch/child's pose 2 x 20 sec Seated 3-way prayer stretch/child's pose 2 x 20 sec  THERAPEUTIC ACTIVITIES: LE MMT   PATIENT EDUCATION:  Education details: HEP update Person educated: Patient Education method: Explanation, Demonstration, and Handouts Education comprehension: verbalized understanding  HOME EXERCISE PROGRAM: Access Code: [email or web address] URL: [email or web address] Date: 04/09/2022 Prepared by: Annie Paras  Exercises - Seated Cervical Retraction  - 2 x daily - 7 x weekly - 2 sets - 10 reps - 3-5 sec hold - Seated Scapular Retraction  - 2 x daily - 7 x weekly - 2 sets - 10 reps - 3-5 sec hold - Seated Scapular Retraction with External Rotation  - 2 x daily - 7 x weekly - 2 sets - 10 reps - 3 sec hold - Standing Shoulder Row with Anchored Resistance  - 1 x daily - 7 x weekly - 3 sets - 10 reps - Shoulder Extension with Anchored Resistance  - 1 x daily - 7 x weekly - 3 sets - 10 reps - Thoracic Foam Roll Mobilization Hug  - 1 x daily - 7 x weekly - 2 sets - 10 reps - Sidelying Thoracic Rotation with Open Book  - 1 x daily - 7 x weekly - 2 sets - 10 reps - 5  sec hold - Doorway Pec Stretch at 90 Degrees Abduction  -  2 x daily - 7 x weekly - 3 reps - 30 sec hold - Doorway Pec Stretch at 120 Degrees Abduction  - 2 x daily - 7 x weekly - 3 reps - 30 sec hold - Hooklying Hamstring Stretch with Strap  - 2 x daily - 7 x weekly - 3 reps - 30 sec hold - Supine Posterior Pelvic Tilt  - 1 x daily - 7 x weekly - 2 sets - 10 reps - 5 sec hold - Child's Pose Stretch  - 2 x daily - 7 x weekly - 3 reps - 30 sec hold - Child's Pose with Sidebending  - 2 x daily - 7 x weekly - 2-3 reps - 30 sec hold - Supine March with Resistance Band  - 1 x daily - 3 x weekly - 2 sets - 10 reps - 2-3 sec hold hold - Hooklying Single Leg Bent Knee Fallouts with Resistance  - 1 x daily - 3 x weekly - 2 sets - 10 reps - 3 sec hold - Bridge with Resistance  - 1 x daily - 3 x weekly - 2 sets - 10 reps - 5 sec hold - Clam with Resistance  - 1 x daily - 3 x weekly - 2 sets - 10 reps - 3-5 sec hold  Patient Education - Posture and Body Mechanics   ASSESSMENT:  CLINICAL IMPRESSION:  Tniya shows shoulder strength globally 4+/5 meeting her goal for strength (LTG #6 ). Cervical and lumbar ROM is [facility] for the most part but she has tightness/discomfort with L lumbar side bend, cervical R side-bend and bil rotation. She continues to have tightness and aching pain between her shoulder blades but overall notes improvement in pain. LTG #3 is now partially met with improved posture noted but at times she still needs cueing to keep shoulders depressed. Pt has progressed toward goals and would continue to benefit from skilled therapy to address remaining strength and ROM deficits.  OBJECTIVE IMPAIRMENTS: decreased activity tolerance, decreased knowledge of condition, decreased mobility, difficulty walking, decreased ROM, decreased strength, hypomobility, increased fascial restrictions, impaired perceived functional ability, increased muscle spasms, impaired flexibility, impaired UE functional use,  improper body mechanics, postural dysfunction, and pain.   ACTIVITY LIMITATIONS: carrying, lifting, bending, sitting, standing, squatting, sleeping, reach over head, hygiene/grooming, and caring for others  PARTICIPATION LIMITATIONS: meal prep, cleaning, laundry, driving, shopping, and community activity  PERSONAL FACTORS: Age, Past/current experiences, Time since onset of injury/illness/exacerbation, and 3+ comorbidities: mild scoliosis; OA - R knee, hands; lumbar DDD; anemia; a-fib; aortic valve disease; HTN; ESRD - HD MWF; hearing loss; anxiety; depression; chronic headaches; insomnia; fatigue  are also affecting patient's functional outcome.   REHAB POTENTIAL: Good  CLINICAL DECISION MAKING: Evolving/moderate complexity  EVALUATION COMPLEXITY: Moderate   GOALS: Goals reviewed with patient? Yes  SHORT TERM GOALS: Target date: 04/02/2022  Patient will be independent with initial HEP to improve outcomes and carryover.  Baseline:  Goal status: MET - 03/24/22  2.  Patient will be able to verbalize and demonstrate proper posture with sitting/standing to decrease/reduce muscle tightness/reinjury .  Baseline:  Goal status: MET - 04/02/22  LONG TERM GOALS: Target date: 04/30/2022  Patient will be independent with ongoing/advanced HEP for self-management at home.  Baseline:  Goal status: IN PROGRESS  2.  Patient will report 75% improvement in back and periscapular pain pain to improve QOL.  Baseline:  Goal status: IN PROGRESS  3.  Patient to demonstrate ability to achieve and maintain  good spinal alignment/posturing and body mechanics needed for daily activities. Baseline:  Goal status: PARTIALLY MET  04/21/22 - good upright posture seen today, cues required to keep shoulders depressed during exercises  4.  Patient will demonstrate functional pain free cervical and lumbar ROM to perform ADLs.   Baseline:  refer to above ROM tables Goal status: IN PROGRESS  04/21/22 - Cervical ROM WFL  but still with discomfort during bil rotation and R side bend  5.  Patient will demonstrate improved proximal B LE strength to >/= 4+/5 for improved stability and ease of mobility . Baseline: refer to above LE MMT table Goal status: PARTIALLY MET  04/09/22 - greatest weakness still proximally  6.  Patient will demonstrate improved B shoulder and scapular strength to >/= 4+/5 for functional UE use w/o pain interference.  Baseline: refer to above UE MMT table Goal status:  MET  04/21/22  7. Patient will report </= 21% on NDI to demonstrate improved functional ability with decreased pain interference. Baseline: 18 / 50 = 36.0 % Goal status: IN PROGRESS  8.  Patient will tolerate 3 hours of sitting w/o increased pain to allow for improved activity tolerance during HD treatments. Baseline:  Goal status: IN PROGRESS  04/09/22 - Pt able to sit for 2 hrs before increased pain during HD yesterday  9.  Patient will report </= 25% sleep disturbance due to back or periscapular pain. Baseline: sleep disturbed for up to 3-5 hrs per NDI Goal status: IN PROGRESS  04/07/22 - Pt reports she was unable to fall asleep until after 3 am last night due to pain     PLAN:  PT FREQUENCY: 2x/week  PT DURATION: 8 weeks  PLANNED INTERVENTIONS: Therapeutic exercises, Therapeutic activity, Neuromuscular re-education, Balance training, Gait training, Patient/Family education, Self Care, Joint mobilization, Aquatic Therapy, Dry Needling, Electrical stimulation, Spinal manipulation, Cryotherapy, Moist heat, Taping, Ultrasound, Manual therapy, and Re-evaluation  PLAN FOR NEXT SESSION: NDI; Progress postural strengthening with emphasis on rhomboid and mid/low trap strengthening. Progress postural and lumbopelvic flexibility/stretching and strengthening. MT +/- DN as indicated.   Artist Pais, PTA 04/21/2022, 2:47 PM   Pooja is progressing well with PT for thoracic and lower back pain. Cervical, lumbar and shoulder ROM  all improving but some ongoing restrictions still noted due to pain/discomfort and/or tightness. Her B shoulder strength is now symmetrical and WFL at 4+/5 with associated LTG #6 now met, however proximal LE weakness still evident L>R. Overall, she is progressing well toward her goals and will continue to benefit from skilled PT to address remaining deficits to improve mobility and activity tolerance with decreased pain interference.   Percival Spanish, PT, MPT 04/21/22, 6:15 PM  [facility] [address]  [place], [place], [address] Phone: [phone]   Fax:  [phone]

## 2022-04-22 DIAGNOSIS — Z23 Encounter for immunization: Secondary | ICD-10-CM | POA: Diagnosis not present

## 2022-04-22 DIAGNOSIS — D509 Iron deficiency anemia, unspecified: Secondary | ICD-10-CM | POA: Diagnosis not present

## 2022-04-22 DIAGNOSIS — N186 End stage renal disease: Secondary | ICD-10-CM | POA: Diagnosis not present

## 2022-04-22 DIAGNOSIS — N2581 Secondary hyperparathyroidism of renal origin: Secondary | ICD-10-CM | POA: Diagnosis not present

## 2022-04-22 DIAGNOSIS — D631 Anemia in chronic kidney disease: Secondary | ICD-10-CM | POA: Diagnosis not present

## 2022-04-23 ENCOUNTER — Ambulatory Visit: Payer: Medicare HMO | Admitting: Physical Therapy

## 2022-04-23 ENCOUNTER — Ambulatory Visit (INDEPENDENT_AMBULATORY_CARE_PROVIDER_SITE_OTHER): Payer: Medicare HMO | Admitting: Family Medicine

## 2022-04-23 ENCOUNTER — Encounter: Payer: Self-pay | Admitting: Family Medicine

## 2022-04-23 ENCOUNTER — Encounter: Payer: Self-pay | Admitting: Physical Therapy

## 2022-04-23 VITALS — BP 129/57 | HR 78 | Ht 66.0 in | Wt 134.4 lb

## 2022-04-23 DIAGNOSIS — M549 Dorsalgia, unspecified: Secondary | ICD-10-CM | POA: Diagnosis not present

## 2022-04-23 DIAGNOSIS — M5459 Other low back pain: Secondary | ICD-10-CM | POA: Diagnosis not present

## 2022-04-23 DIAGNOSIS — M62830 Muscle spasm of back: Secondary | ICD-10-CM | POA: Diagnosis not present

## 2022-04-23 DIAGNOSIS — R293 Abnormal posture: Secondary | ICD-10-CM

## 2022-04-23 DIAGNOSIS — M6281 Muscle weakness (generalized): Secondary | ICD-10-CM | POA: Diagnosis not present

## 2022-04-23 DIAGNOSIS — M546 Pain in thoracic spine: Secondary | ICD-10-CM | POA: Diagnosis not present

## 2022-04-23 MED ORDER — CYCLOBENZAPRINE HCL 5 MG PO TABS: 5.0000 mg | ORAL_TABLET | Freq: Two times a day (BID) | ORAL | 1 refills | Status: DC | PRN

## 2022-04-23 NOTE — Progress Notes (Signed)
   Acute Office Visit  Subjective:     Patient ID: Jillian Hayes, female    DOB: [birth year], 77 y.o.   MRN: [id number]  CC: upper back pain   HPI Patient is in today for chronic upper back pain.   Patient reports continuing with upper back. She last saw this for me in January and we tried lidocaine patches and PT which seems to be only mildly helping. Thoracic spine xray at that time with no acute findings, but mild degenerative findings and mild scoliosis of thoracic spine. She has been going to PT every Tuesday/Thursday. States the pain seems to be worse with any household activities (cleaning, cooking, etc), sitting at dialysis for 3 hours straight, etc. She denies any radiation of pain, numbness, tingling. She had a friend mention that a muscle relaxer might be helpful.     ROS All review of systems negative except what is listed in the HPI      Objective:    BP (!) 129/57   Pulse 78   Ht 5\' 6"  (1.676 m)   Wt 134 lb 6.4 oz (61 kg)   BMI 21.69 kg/m    Physical Exam Vitals reviewed.  Constitutional:      General: She is not in acute distress.    Appearance: Normal appearance. She is not ill-appearing.  Musculoskeletal:        General: No swelling. Normal range of motion.     Comments: Mild scoliosis. Tenderness to palpation and muscle tension noted to bilateral paraspinal muscles of upper back  Neurological:     General: No focal deficit present.     Mental Status: She is alert and oriented to person, place, and time. Mental status is at baseline.  Psychiatric:        Mood and Affect: Mood normal.        Behavior: Behavior normal.        Thought Content: Thought content normal.        Judgment: Judgment normal.     No results found for any visits on 04/23/22.      Assessment & Plan:   Problem List Items Addressed This Visit   None Visit Diagnoses     Upper back pain    -  Primary   Relevant Medications   cyclobenzaprine (FLEXERIL) 5 MG tablet       Adding PRN flexeril - we discussed safe use in the elderly. Continue supportive measures including physical therapy.  Keep upcoming appointment with PCP in about 2 weeks.   Meds ordered this encounter  Medications   cyclobenzaprine (FLEXERIL) 5 MG tablet    Sig: Take 1 tablet (5 mg total) by mouth 2 (two) times daily as needed for muscle spasms.    Dispense:  30 tablet    Refill:  1    Order Specific Question:   Supervising Provider    Answer:   [name] A [[id number]]    Return if symptoms worsen or fail to improve.  Terrilyn Saver, NP

## 2022-04-23 NOTE — Patient Instructions (Signed)
Adding some as needed Flexeril. We discussed safety - please use sparingly.

## 2022-04-23 NOTE — Therapy (Addendum)
OUTPATIENT PHYSICAL THERAPY TREATMENT     Patient Name: Jillian Hayes MRN: [id number] DOB:Mar 22, 1945, 77 y.o., female Today's Date: 04/23/2022  END OF SESSION:  PT End of Session - 04/23/22 1409     Visit Number 11    Date for PT Re-Evaluation 04/30/22    Authorization Type [employer]    Progress Note Due on Visit --    PT Start Time 1409    PT Stop Time 1447    PT Time Calculation (min) 38 min    Activity Tolerance Patient tolerated treatment well    Behavior During Therapy [facility] for tasks assessed/performed                    Past Medical History:  Diagnosis Date   Advanced care planning/counseling discussion 06/10/2014   05/31/2014 patient presents copy of HCP and Living Will   Anemia    iron deficiency   Anxiety    Arthritis    "in hands"   Atrial fibrillation ([place])    Benign fundic gland polyps of stomach    Bladder polyps 06/25/2010   Chronic headaches [id number]   Complication of anesthesia    "woke up at the end of a cyst removal surgery in 1991"   Depression 1991   hospitalized   Diverticulosis    ESRD (end stage renal disease) ([place]) 11/09/2015   HD on MWF   External prolapsed hemorrhoids    History of chicken pox 06/25/2010   Hypercalcemia 02/18/2014   Hypertension    Insomnia 06/24/2010   Multiple chemical sensitivity syndrome 06/25/2010   Proteinuria 02/18/2014   Renal insufficiency 03/26/2011   Valvular heart disease 04/28/2016   Past Surgical History:  Procedure Laterality Date   AV FISTULA PLACEMENT Left    x4   COLONOSCOPY  2014   cyst on left breast removed Left 1991   benign   ESOPHAGOGASTRODUODENOSCOPY (EGD) WITH ESOPHAGEAL DILATION  2014   LAPAROSCOPIC BILATERAL SALPINGO OOPHERECTOMY Bilateral 08/20/2020   Procedure: LAPAROSCOPIC BILATERAL SALPINGO OOPHORECTOMY;  Surgeon: Megan Salon, MD;  Location: [facility];  Service: Gynecology;  Laterality: Bilateral;   NASAL SEPTUM SURGERY  1986   rhinoplasty   polyps on bladder  removed  1972   benign   RIGHT HEART CATH N/A 04/10/2021   Procedure: RIGHT HEART CATH;  Surgeon: Larey Dresser, MD;  Location: [facility];  Service: Cardiovascular;  Laterality: N/A;   TONSILLECTOMY  1962   Patient Active Problem List   Diagnosis Date Noted   Pulmonary hypertension, primary ([place]) 03/09/2021   Low TSH level 03/09/2021   Right knee pain 03/06/2021   Primary osteoarthritis of right knee 02/25/2021   Adnexal mass    A-V fistula ([place]) 08/03/2019   Nonrheumatic mitral valve regurgitation 06/12/2019   Edema 04/09/2019   Drug-induced constipation 10/18/2018   External prolapsed hemorrhoids 09/25/2018   Hyperphosphatemia 03/09/2018   Leg swelling 03/03/2018   Long term (current) use of anticoagulants 11/01/2017   Atrial fibrillation ([place]) 10/25/2017   Hyperparathyroidism, unspecified ([place]) 05/04/2017   DDD (degenerative disc disease), lumbar 04/07/2017   Hearing loss 01/04/2017   Hyperlipidemia 01/04/2017   Arthritis of fingers of both hands 10/01/2016   Chronic rhinitis 10/01/2016   Nonrheumatic aortic valve insufficiency 09/29/2016   Bruit 09/29/2016   Palpitation 09/22/2016   Aortic valve disease 04/28/2016   Anemia in ESRD (end-stage renal disease) ([place]) 03/03/2016   Sun-damaged skin 01/21/2016   ESRD (end stage renal disease) ([place]) 11/09/2015  Advanced care planning/counseling discussion 06/10/2014   Pedal edema 03/28/2014   Proteinuria 02/18/2014   Hypercalcemia 02/18/2014   Abnormal EKG 01/22/2014   HTN (hypertension) 01/22/2014   Dermatitis 06/30/2013   Abnormal thyroid function test 06/25/2013   Cervical cancer screening 06/10/2012   Preventative health care 11/09/2011   Multiple chemical sensitivity syndrome 06/25/2010   History of chicken pox 06/25/2010   Bladder polyps 06/25/2010   History of cardiac dysrhythmia 06/25/2010   Insomnia 06/24/2010   Fatigue 06/24/2010   Chronic headaches 06/24/2010   Allergy    Anemia    Depression      PCP: [name], MD   REFERRING PROVIDER: Terrilyn Saver, NP   REFERRING DIAG: M54.9 (ICD-10-CM) - Upper back pain   THERAPY DIAG:  Pain in thoracic spine  Abnormal posture  Other low back pain  Muscle weakness (generalized)  Muscle spasm of back  RATIONALE FOR EVALUATION AND TREATMENT: Rehabilitation  ONSET DATE: late Nov 2023  NEXT MD VISIT: 05/05/22 with PCP   SUBJECTIVE:                                                                                                                                                                                                         SUBJECTIVE STATEMENT:   Pt reports her pain is well-managed until she tries to do something more active such as scrubbing her bathroom on Tuesday evening - suffered afterwards when it comes to sleeping and sitting through HD. She plans to ask her PCP about trying a muscle relaxant.   PAIN: Are you having pain? Yes: NPRS scale:  2/10 Pain location: middle of L shoulder blade Pain description: sore Aggravating factors: at night worse, only sleeps 2 hours, dishes, cooking, getting things in/out of dryer, driving Relieving factors: deep massage, icy/hot, DN  Are you having pain? Yes: NPRS scale:  2/10 Pain location: L low back just above waist Pain description: stiff, grabs periodically Aggravating factors: getting into the car, bending over at sink, washer and dryer Relieving factors: stretches, ice  PERTINENT HISTORY:  mild scoliosis; OA - R knee, hands; lumbar DDD; anemia; a-fib; aortic valve disease; HTN; ESRD - HD MWF; hearing loss; anxiety; depression; chronic headaches; insomnia; fatigue  PRECAUTIONS: Other: HD MWF - fistula in L upper arm  WEIGHT BEARING RESTRICTIONS: No  FALLS:  Has patient fallen in last 6 months? No  LIVING ENVIRONMENT: Lives with: lives with their spouse and lives with their daughter Lives in: House/apartment Stairs: Yes: Internal: 14 steps; on right going up  (dtr lives upstairs,  she rarely goes up) Has following equipment at home: shower chair  OCCUPATION: Retired  PLOF: Independent and Leisure: reading on Kindle, walking when she has the energy ~2x/wk or when shopping  PATIENT GOALS: "To be able to sleep w/o back hurting. To be able to sit through HD and do things around the house w/o pain."   OBJECTIVE:   DIAGNOSTIC FINDINGS:  03/03/22 - Thoracic spine x-ray: IMPRESSION: 1. No acute fracture. 2. Mild degenerative changes in the thoracic spine.  PATIENT SURVEYS:  NDI 18 / 50 = 36.0 % ; 13 / 50 = 26.0 % (04/23/22)  COGNITION:  Overall cognitive status: Within functional limits for tasks assessed    SENSATION: WFL  MUSCLE LENGTH: Hamstrings: mild/mod tight L>R ITB: mod tight R Piriformis: mod tight R Hip flexors: mod tight R, mild tight L Quads: mod tight R, mild tight L Heelcord: NT  POSTURE:  rounded shoulders, forward head, increased thoracic kyphosis, and mils scoliosis  PALPATION: TTP with increased muscle tension and taut bands in R>L thoracolumbar paraspinals, L >R cervicothoracic paraspinals and periscapular muscles   CERVICAL ROM:   Active ROM AROM (deg) eval AROM 04/07/22 AROM 04/21/22  Flexion 56 58 WFL  Extension 24 38 WFL  Right lateral flexion 38 42 WFL- discomfort on Lside  Left lateral flexion 20 28 WFL- discomfort L side  Right rotation 56 61 WFL- discomfort L side  Left rotation 71 66 WFL- discomfort on L side   (Blank rows = not tested)  UPPER EXTREMITY ROM:  Active ROM Right eval Left eval Right 04/07/22 Left    04/07/22  Shoulder flexion WNL WNL WNL WNL  Shoulder extension 48 40 48 45  Shoulder abduction 170 144 170 154  Shoulder adduction      Shoulder internal rotation FIR - WNL FIR - WNL    Shoulder external rotation FER - WNL FER - WNL    Elbow flexion      Elbow extension      Wrist flexion      Wrist extension      Wrist ulnar deviation      Wrist radial deviation      Wrist  pronation      Wrist supination       (Blank rows = not tested)   LUMBAR ROM:   Active  AROM  eval AROM 04/21/22  Flexion Hands to ankles WFL  Extension 50% limited Limited 25%  Right lateral flexion Hand to lateral knee Lateral knee  Left lateral flexion Hand to lateral knee Tight - hand to lateral knee  Right rotation 50% limited WFL  Left rotation 50% limited WFL  (Blank rows = not tested)  LOWER EXTREMITY ROM:    Grossly WFL/WNL  UPPER EXTREMITY MMT:  MMT Right eval Left eval Right 04/07/22 Left 04/07/22 Rt and Lt 04/21/22  Shoulder flexion 4+ 4- * 4+ 4 4+  Shoulder extension 4+ 4 4+ 4+   Shoulder abduction 4 * 4- * 4+ 4+ 4+  Shoulder adduction       Shoulder internal rotation 4+ 4 5 4+ 4+  Shoulder external rotation 4+ 4- 4+ 4+ 4+  Middle trapezius 4 4- 4 4 4+  Lower trapezius       Elbow flexion       Elbow extension       Wrist flexion       Wrist extension       Wrist ulnar deviation       Wrist radial  deviation       Wrist pronation       Wrist supination       Grip strength        (Blank rows = not tested)  LOWER EXTREMITY MMT:    MMT Right eval Left eval Right 04/09/22 Left 04/09/22  Hip flexion 4- 4- 4- 4-  Hip extension unable unable 4 4  Hip abduction 4- 4- 4 4-  Hip adduction 4 4- 4+ 4  Hip internal rotation 4 4- 4+ 4+  Hip external rotation 4- 4- 4- 4-  Knee flexion 5 5 5 5   Knee extension 5 5 5 5   Ankle dorsiflexion 4- 4- 4+ 4-  Ankle plantarflexion      Ankle inversion      Ankle eversion       (Blank rows = not tested)   TODAY'S TREATMENT:   04/23/22 THERAPEUTIC EXERCISE: to improve flexibility, strength and mobility.  Verbal and tactile cues throughout for technique.  UBE - L2.0 x 6 min (3' each fwd & back) 3-way doorway pec stretch at 60/90/120 2 x 30" each Seated rhomboid stretch with arms folded across chest 2 x 30"   Seated rhomboid/upper thoracic stretch with hands clasped and shoulders protracted with fwd bend 2 x 30" Seated  3-way thoracolumbar flexion stretch with hands supported on Swiffer handle x 30" each position (Patient plans to try this using hands on desk with rolling desk chair at home - HEP instructions describe using Pball)  THERAPEUTIC ACTIVITIES: NDI: 13 / 50 = 26.0 %   04/21/22 THERAPEUTIC EXERCISE: to improve flexibility, strength and mobility.  Verbal and tactile cues throughout for technique.  UBE - L2.0 x 6 min (3' each fwd & back) Supine serratus punch 2# 2x10 bil Horiz ABD RTB supine x 10  Alt diagonals YTB x 10 supine Shoulder strength, cervical and lumbar AROM assessed  Manual therapy: IASTM to thoracic PS, rhomboids with foam roll   04/14/22 THERAPEUTIC EXERCISE: to improve flexibility, strength and mobility.  Verbal and tactile cues throughout for technique.  UBE - L1.5 x 6 min (3' each fwd & back) Standing horizontal ABD YTB x 10  Standing shoulder flexion with YTB x 10 bil Standing wall angels long arc x 10; short arc x 10 Standing thoracolumbar ext 10x Seated rhomboid stretch hands clasped in front of patient with fwd bend 3x15" Prone shoulder flexion with lower trap setting x 10 bil  Manual Therapy: to decrease muscle spasm, pain and improve mobility.  IASTM to thoracic PS with foam roll, STM to R LS   PATIENT EDUCATION:  Education details: HEP update, HEP review, and HEP consolidation Person educated: Patient Education method: Explanation, Demonstration, and Handouts Education comprehension: verbalized understanding, returned demonstration, and needs further education  HOME EXERCISE PROGRAM: Access Code: [email or web address] URL: [email or web address] Date: 04/23/2022 Prepared by: Annie Paras  Exercises - Seated Scapular Retraction  - 2 x daily - 7 x weekly - 2 sets - 10 reps - 3-5 sec hold - Seated Scapular Retraction with External Rotation  - 2 x daily - 7 x weekly - 2 sets - 10 reps - 3 sec hold - Standing Shoulder Row with Anchored Resistance  - 1 x daily  - 7 x weekly - 3 sets - 10 reps - Shoulder Extension with Anchored Resistance  - 1 x daily - 7 x weekly - 3 sets - 10 reps - Sidelying Thoracic Rotation with Open Book  - 1 x daily - 7 x  weekly - 2 sets - 10 reps - 5 sec hold - Doorway Pec Stretch at 60 Degrees Abduction with Arm Straight  - 2-3 x daily - 7 x weekly - 3 reps - 30 sec hold - Doorway Pec Stretch at 90 Degrees Abduction  - 2 x daily - 7 x weekly - 3 reps - 30 sec hold - Doorway Pec Stretch at 120 Degrees Abduction  - 2 x daily - 7 x weekly - 3 reps - 30 sec hold - Seated Rhomboid Stretch  - 1 x daily - 7 x weekly - 2 sets - 10 reps - 3 sec hold - Standing Lower Cervical and Upper Thoracic Stretch  - 1 x daily - 7 x weekly - 3 reps - 30 sec hold - Seated Flexion Stretch with Swiss Ball  - 2 x daily - 7 x weekly - 3 reps - 30 sec hold - Seated Thoracic Flexion and Rotation with Swiss Ball  - 2 x daily - 7 x weekly - 3 reps - 30 sec hold - Child's Pose Stretch  - 2 x daily - 7 x weekly - 3 reps - 30 sec hold - Child's Pose with Sidebending  - 2 x daily - 7 x weekly - 2-3 reps - 30 sec hold - Hooklying Hamstring Stretch with Strap  - 2 x daily - 7 x weekly - 3 reps - 30 sec hold - Supine Posterior Pelvic Tilt  - 1 x daily - 7 x weekly - 2 sets - 10 reps - 5 sec hold - Supine March with Resistance Band  - 1 x daily - 3 x weekly - 2 sets - 10 reps - 2-3 sec hold hold - Hooklying Single Leg Bent Knee Fallouts with Resistance  - 1 x daily - 3 x weekly - 2 sets - 10 reps - 3 sec hold - Bridge with Resistance  - 1 x daily - 3 x weekly - 2 sets - 10 reps - 5 sec hold - Clam with Resistance  - 1 x daily - 3 x weekly - 2 sets - 10 reps - 3-5 sec hold  Patient Education - Posture and Body Mechanics   ASSESSMENT:  CLINICAL IMPRESSION:  Xolani reports 50-75% improvement in pain but states pain remains variable depending on activity.  Pain remains most disruptive with sleep and continues to limit sitting tolerance during dialysis treatment.   Kiomara would like to try transitioning to her HEP as of the end of current POC next week to allow her more freedom in her scheduling on days off of HD, therefore today's session focusing on initiation of review/update and consolidation of her HEP.  Reviewed upper body focused exercises and stretches, eliminating ineffective exercises and fine-tuning stretches to continue to address areas of increased muscle tension and tightness.  A few exercises provided with more than 1 option for patient to try out at home and let us know next visit which version is most effective. Verbally reviewed lumbar stretches and stabilization exercises and will plan to address/update these as necessary next visit.  Anticipate transition to HEP at end of current POC next week with probable 30-day hold.  OBJECTIVE IMPAIRMENTS: decreased activity tolerance, decreased knowledge of condition, decreased mobility, difficulty walking, decreased ROM, decreased strength, hypomobility, increased fascial restrictions, impaired perceived functional ability, increased muscle spasms, impaired flexibility, impaired UE functional use, improper body mechanics, postural dysfunction, and pain.   ACTIVITY LIMITATIONS: carrying, lifting, bending, sitting, standing, squatting, sleeping, reach over  head, hygiene/grooming, and caring for others  PARTICIPATION LIMITATIONS: meal prep, cleaning, laundry, driving, shopping, and community activity  PERSONAL FACTORS: Age, Past/current experiences, Time since onset of injury/illness/exacerbation, and 3+ comorbidities: mild scoliosis; OA - R knee, hands; lumbar DDD; anemia; a-fib; aortic valve disease; HTN; ESRD - HD MWF; hearing loss; anxiety; depression; chronic headaches; insomnia; fatigue  are also affecting patient's functional outcome.   REHAB POTENTIAL: Good  CLINICAL DECISION MAKING: Evolving/moderate complexity  EVALUATION COMPLEXITY: Moderate   GOALS: Goals reviewed with patient? Yes  SHORT  TERM GOALS: Target date: 04/02/2022  Patient will be independent with initial HEP to improve outcomes and carryover.  Baseline:  Goal status: MET  03/24/22  2.  Patient will be able to verbalize and demonstrate proper posture with sitting/standing to decrease/reduce muscle tightness/reinjury .  Baseline:  Goal status: MET  04/02/22  LONG TERM GOALS: Target date: 04/30/2022  Patient will be independent with ongoing/advanced HEP for self-management at home.  Baseline:  Goal status: IN PROGRESS  04/23/22 - initiated final HEP review/update/consolidation  2.  Patient will report 75% improvement in back and periscapular pain pain to improve QOL.  Baseline:  Goal status: IN PROGRESS  04/23/22 - pt reports "some days 75%, some days 50%"  3.  Patient to demonstrate ability to achieve and maintain good spinal alignment/posturing and body mechanics needed for daily activities. Baseline:  Goal status: PARTIALLY MET  04/21/22 - good upright posture seen today, cues required to keep shoulders depressed during exercises  4.  Patient will demonstrate functional pain free cervical and lumbar ROM to perform ADLs.   Baseline:  refer to above ROM tables Goal status: IN PROGRESS  04/21/22 - Cervical ROM WFL but still with discomfort during bil rotation and R side bend  5.  Patient will demonstrate improved proximal B LE strength to >/= 4+/5 for improved stability and ease of mobility . Baseline: refer to above LE MMT table Goal status: PARTIALLY MET  04/09/22 - greatest weakness still proximally  6.  Patient will demonstrate improved B shoulder and scapular strength to >/= 4+/5 for functional UE use w/o pain interference.  Baseline: refer to above UE MMT table Goal status:  MET  04/21/22  7. Patient will report </= 21% on NDI to demonstrate improved functional ability with decreased pain interference. Baseline: 18 / 50 = 36.0 % Goal status: IN PROGRESS  04/23/22 - 13 / 50 = 26.0 %  8.  Patient will tolerate  3 hours of sitting w/o increased pain to allow for improved activity tolerance during HD treatments. Baseline:  Goal status: IN PROGRESS  04/23/22 - Pt typically able to sit for 2 hrs before increased pain during HD   9.  Patient will report </= 25% sleep disturbance due to back or periscapular pain. Baseline: sleep disturbed for up to 3-5 hrs per NDI Goal status: IN PROGRESS  04/23/22 - Pt reports sleep disturbance up to 90%   PLAN:  PT FREQUENCY: 2x/week  PT DURATION: 8 weeks  PLANNED INTERVENTIONS: Therapeutic exercises, Therapeutic activity, Neuromuscular re-education, Balance training, Gait training, Patient/Family education, Self Care, Joint mobilization, Aquatic Therapy, Dry Needling, Electrical stimulation, Spinal manipulation, Cryotherapy, Moist heat, Taping, Ultrasound, Manual therapy, and Re-evaluation  PLAN FOR NEXT SESSION: Complete HEP review/update/consolidation identifying most effective upper body stretches and deleting less effective versions as well as reviewing and updating lumbopelvic exercises and stretches as indicated; progress postural strengthening with emphasis on rhomboid and mid/low trap strengthening; progress postural and lumbopelvic flexibility/stretching and strengthening.  MT +/- DN as indicated.   Percival Spanish, PT 04/23/2022, 2:49 PM

## 2022-04-24 DIAGNOSIS — N2581 Secondary hyperparathyroidism of renal origin: Secondary | ICD-10-CM | POA: Diagnosis not present

## 2022-04-24 DIAGNOSIS — Z23 Encounter for immunization: Secondary | ICD-10-CM | POA: Diagnosis not present

## 2022-04-24 DIAGNOSIS — D631 Anemia in chronic kidney disease: Secondary | ICD-10-CM | POA: Diagnosis not present

## 2022-04-24 DIAGNOSIS — D509 Iron deficiency anemia, unspecified: Secondary | ICD-10-CM | POA: Diagnosis not present

## 2022-04-24 DIAGNOSIS — N186 End stage renal disease: Secondary | ICD-10-CM | POA: Diagnosis not present

## 2022-04-27 DIAGNOSIS — N186 End stage renal disease: Secondary | ICD-10-CM | POA: Diagnosis not present

## 2022-04-27 DIAGNOSIS — D631 Anemia in chronic kidney disease: Secondary | ICD-10-CM | POA: Diagnosis not present

## 2022-04-27 DIAGNOSIS — Z7901 Long term (current) use of anticoagulants: Secondary | ICD-10-CM | POA: Diagnosis not present

## 2022-04-27 DIAGNOSIS — N2581 Secondary hyperparathyroidism of renal origin: Secondary | ICD-10-CM | POA: Diagnosis not present

## 2022-04-27 DIAGNOSIS — Z23 Encounter for immunization: Secondary | ICD-10-CM | POA: Diagnosis not present

## 2022-04-27 DIAGNOSIS — D509 Iron deficiency anemia, unspecified: Secondary | ICD-10-CM | POA: Diagnosis not present

## 2022-04-28 ENCOUNTER — Other Ambulatory Visit: Payer: Self-pay

## 2022-04-28 ENCOUNTER — Ambulatory Visit: Payer: Medicare HMO

## 2022-04-28 DIAGNOSIS — M62830 Muscle spasm of back: Secondary | ICD-10-CM | POA: Diagnosis not present

## 2022-04-28 DIAGNOSIS — M546 Pain in thoracic spine: Secondary | ICD-10-CM | POA: Diagnosis not present

## 2022-04-28 DIAGNOSIS — M6281 Muscle weakness (generalized): Secondary | ICD-10-CM

## 2022-04-28 DIAGNOSIS — M5459 Other low back pain: Secondary | ICD-10-CM | POA: Diagnosis not present

## 2022-04-28 DIAGNOSIS — R293 Abnormal posture: Secondary | ICD-10-CM | POA: Diagnosis not present

## 2022-04-28 NOTE — Therapy (Addendum)
OUTPATIENT PHYSICAL THERAPY TREATMENT     Patient Name: [name] MRN: [id number] DOB:02/15/1945, 77 y.o., female Today's Date: 04/28/2022  END OF SESSION:  PT End of Session - 04/28/22 1226     Visit Number 12    Date for PT Re-Evaluation 04/30/22    Authorization Type [employer]    PT Start Time 1106    PT Stop Time 1202    PT Time Calculation (min) 56 min    Activity Tolerance Patient tolerated treatment well    Behavior During Therapy [facility] for tasks assessed/performed                     Past Medical History:  Diagnosis Date   Advanced care planning/counseling discussion 06/10/2014   05/31/2014 patient presents copy of HCP and Living Will   Anemia    iron deficiency   Anxiety    Arthritis    "in hands"   Atrial fibrillation ([place])    Benign fundic gland polyps of stomach    Bladder polyps 06/25/2010   Chronic headaches [id number]   Complication of anesthesia    "woke up at the end of a cyst removal surgery in 1991"   Depression 1991   hospitalized   Diverticulosis    ESRD (end stage renal disease) ([place]) 11/09/2015   HD on MWF   External prolapsed hemorrhoids    History of chicken pox 06/25/2010   Hypercalcemia 02/18/2014   Hypertension    Insomnia 06/24/2010   Multiple chemical sensitivity syndrome 06/25/2010   Proteinuria 02/18/2014   Renal insufficiency 03/26/2011   Valvular heart disease 04/28/2016   Past Surgical History:  Procedure Laterality Date   AV FISTULA PLACEMENT Left    x4   COLONOSCOPY  2014   cyst on left breast removed Left 1991   benign   ESOPHAGOGASTRODUODENOSCOPY (EGD) WITH ESOPHAGEAL DILATION  2014   LAPAROSCOPIC BILATERAL SALPINGO OOPHERECTOMY Bilateral 08/20/2020   Procedure: LAPAROSCOPIC BILATERAL SALPINGO OOPHORECTOMY;  Surgeon: Megan Salon, MD;  Location: [facility];  Service: Gynecology;  Laterality: Bilateral;   NASAL SEPTUM SURGERY  1986   rhinoplasty   polyps on bladder removed  1972   benign   RIGHT  HEART CATH N/A 04/10/2021   Procedure: RIGHT HEART CATH;  Surgeon: Larey Dresser, MD;  Location: [facility];  Service: Cardiovascular;  Laterality: N/A;   TONSILLECTOMY  1962   Patient Active Problem List   Diagnosis Date Noted   Pulmonary hypertension, primary ([place]) 03/09/2021   Low TSH level 03/09/2021   Right knee pain 03/06/2021   Primary osteoarthritis of right knee 02/25/2021   Adnexal mass    A-V fistula ([place]) 08/03/2019   Nonrheumatic mitral valve regurgitation 06/12/2019   Edema 04/09/2019   Drug-induced constipation 10/18/2018   External prolapsed hemorrhoids 09/25/2018   Hyperphosphatemia 03/09/2018   Leg swelling 03/03/2018   Long term (current) use of anticoagulants 11/01/2017   Atrial fibrillation ([place]) 10/25/2017   Hyperparathyroidism, unspecified ([place]) 05/04/2017   DDD (degenerative disc disease), lumbar 04/07/2017   Hearing loss 01/04/2017   Hyperlipidemia 01/04/2017   Arthritis of fingers of both hands 10/01/2016   Chronic rhinitis 10/01/2016   Nonrheumatic aortic valve insufficiency 09/29/2016   Bruit 09/29/2016   Palpitation 09/22/2016   Aortic valve disease 04/28/2016   Anemia in ESRD (end-stage renal disease) ([place]) 03/03/2016   Sun-damaged skin 01/21/2016   ESRD (end stage renal disease) ([place]) 11/09/2015   Advanced care planning/counseling discussion 06/10/2014   Pedal  edema 03/28/2014   Proteinuria 02/18/2014   Hypercalcemia 02/18/2014   Abnormal EKG 01/22/2014   HTN (hypertension) 01/22/2014   Dermatitis 06/30/2013   Abnormal thyroid function test 06/25/2013   Cervical cancer screening 06/10/2012   Preventative health care 11/09/2011   Multiple chemical sensitivity syndrome 06/25/2010   History of chicken pox 06/25/2010   Bladder polyps 06/25/2010   History of cardiac dysrhythmia 06/25/2010   Insomnia 06/24/2010   Fatigue 06/24/2010   Chronic headaches 06/24/2010   Allergy    Anemia    Depression     PCP: [name], MD    REFERRING PROVIDER: Terrilyn Saver, NP   REFERRING DIAG: M54.9 (ICD-10-CM) - Upper back pain   THERAPY DIAG:  Pain in thoracic spine  Abnormal posture  Muscle weakness (generalized)  Muscle spasm of back  Other low back pain  RATIONALE FOR EVALUATION AND TREATMENT: Rehabilitation  ONSET DATE: late Nov 2023  NEXT MD VISIT: 05/05/22 with PCP   SUBJECTIVE:                                                                                                                                                                                                         SUBJECTIVE STATEMENT:   Pt reports has tried a muscle relaxant and no difference in pain. Particularly has pain at night when attempting to sleep.   PAIN: Are you having pain? Yes: NPRS scale:  2/10 Pain location: middle of L shoulder blade Pain description: sore Aggravating factors: at night worse, only sleeps 2 hours, dishes, cooking, getting things in/out of dryer, driving Relieving factors: deep massage, icy/hot, DN  Are you having pain? Yes: NPRS scale:  2/10 Pain location: L low back just above waist Pain description: stiff, grabs periodically Aggravating factors: getting into the car, bending over at sink, washer and dryer Relieving factors: stretches, ice  PERTINENT HISTORY:  mild scoliosis; OA - R knee, hands; lumbar DDD; anemia; a-fib; aortic valve disease; HTN; ESRD - HD MWF; hearing loss; anxiety; depression; chronic headaches; insomnia; fatigue  PRECAUTIONS: Other: HD MWF - fistula in L upper arm  WEIGHT BEARING RESTRICTIONS: No  FALLS:  Has patient fallen in last 6 months? No  LIVING ENVIRONMENT: Lives with: lives with their spouse and lives with their daughter Lives in: House/apartment Stairs: Yes: Internal: 14 steps; on right going up (dtr lives upstairs, she rarely goes up) Has following equipment at home: shower chair  OCCUPATION: Retired  PLOF: Independent and Leisure: reading on Kindle,  walking when she has the energy ~2x/wk or when shopping  PATIENT GOALS: "To be able to sleep w/o back hurting. To be able to sit through HD and do things around the house w/o pain."   OBJECTIVE:   DIAGNOSTIC FINDINGS:  03/03/22 - Thoracic spine x-ray: IMPRESSION: 1. No acute fracture. 2. Mild degenerative changes in the thoracic spine.  PATIENT SURVEYS:  NDI 18 / 50 = 36.0 % ; 13 / 50 = 26.0 % (04/23/22)  COGNITION:  Overall cognitive status: Within functional limits for tasks assessed    SENSATION: WFL  MUSCLE LENGTH: Hamstrings: mild/mod tight L>R ITB: mod tight R Piriformis: mod tight R Hip flexors: mod tight R, mild tight L Quads: mod tight R, mild tight L Heelcord: NT  POSTURE:  rounded shoulders, forward head, increased thoracic kyphosis, and mils scoliosis  PALPATION: TTP with increased muscle tension and taut bands in R>L thoracolumbar paraspinals, L >R cervicothoracic paraspinals and periscapular muscles   CERVICAL ROM:   Active ROM AROM (deg) eval AROM 04/07/22 AROM 04/21/22  Flexion 56 58 WFL  Extension 24 38 WFL  Right lateral flexion 38 42 WFL- discomfort on Lside  Left lateral flexion 20 28 WFL- discomfort L side  Right rotation 56 61 WFL- discomfort L side  Left rotation 71 66 WFL- discomfort on L side   (Blank rows = not tested)  UPPER EXTREMITY ROM:  Active ROM Right eval Left eval Right 04/07/22 Left    04/07/22  Shoulder flexion WNL WNL WNL WNL  Shoulder extension 48 40 48 45  Shoulder abduction 170 144 170 154  Shoulder adduction      Shoulder internal rotation FIR - WNL FIR - WNL    Shoulder external rotation FER - WNL FER - WNL    Elbow flexion      Elbow extension      Wrist flexion      Wrist extension      Wrist ulnar deviation      Wrist radial deviation      Wrist pronation      Wrist supination       (Blank rows = not tested)   LUMBAR ROM:   Active  AROM  eval AROM 04/21/22  Flexion Hands to ankles WFL  Extension 50%  limited Limited 25%  Right lateral flexion Hand to lateral knee Lateral knee  Left lateral flexion Hand to lateral knee Tight - hand to lateral knee  Right rotation 50% limited WFL  Left rotation 50% limited WFL  (Blank rows = not tested)  LOWER EXTREMITY ROM:    Grossly WFL/WNL  UPPER EXTREMITY MMT:  MMT Right eval Left eval Right 04/07/22 Left 04/07/22 Rt and Lt 04/21/22  Shoulder flexion 4+ 4- * 4+ 4 4+  Shoulder extension 4+ 4 4+ 4+   Shoulder abduction 4 * 4- * 4+ 4+ 4+  Shoulder adduction       Shoulder internal rotation 4+ 4 5 4+ 4+  Shoulder external rotation 4+ 4- 4+ 4+ 4+  Middle trapezius 4 4- 4 4 4+  Lower trapezius       Elbow flexion       Elbow extension       Wrist flexion       Wrist extension       Wrist ulnar deviation       Wrist radial deviation       Wrist pronation       Wrist supination       Grip strength        (Blank  rows = not tested)  LOWER EXTREMITY MMT:    MMT Right eval Left eval Right 04/09/22 Left 04/09/22  Hip flexion 4- 4- 4- 4-  Hip extension unable unable 4 4  Hip abduction 4- 4- 4 4-  Hip adduction 4 4- 4+ 4  Hip internal rotation 4 4- 4+ 4+  Hip external rotation 4- 4- 4- 4-  Knee flexion 5 5 5 5   Knee extension 5 5 5 5   Ankle dorsiflexion 4- 4- 4+ 4-  Ankle plantarflexion      Ankle inversion      Ankle eversion       (Blank rows = not tested)   TODAY'S TREATMENT:  04/28/22: Therapeutic exercise:  to improve posture,proprioception,flexibility . Patient indicates that she is comfortable with current home program as established and condensed last visit.  Added other exercises today specific to elongate and self mobilize her thoracic spine utilizing a 1/2 thoracic foam roller, and a small swim noodle.  Instructed in supine stretch with foam parallel to thoracic spine, for pec minor, pec major, and lat dorsi.  Instructed in static prolonged stretch and dynamic moving stretches, with pec flys, also B shoulder flexion overhead stretch  and alternating shoulder flexion stretch.  Added 1 # wts for trial as well to each hand for additional overpressure, dynamic stretching.  Alternated between static holds up to 2 min, and slow dynamic stretching 10 reps each side  Dry needling performed by Annie Paras, PT. MANUAL THERAPY: To promote normalized muscle tension, improved flexibility, and reduced pain. Skilled palpation and monitoring of soft tissue during DN Trigger Point Dry-Needling  Treatment instructions: Expect mild to moderate muscle soreness. S/S of pneumothorax if dry needled over a lung field, and to seek immediate medical attention should they occur. Patient verbalized understanding of these instructions and education. Patient Consent Given: Yes Education handout provided: Previously provided Muscles treated: B rhomboids, subscapularis, LS and UT Electrical stimulation performed: No Parameters: N/A Treatment response/outcome: Twitch Response Elicited and Palpable Increase in Muscle Length STM/DTM, manual TPR and pin & stretch to muscles addressed with DN   04/23/22 THERAPEUTIC EXERCISE: to improve flexibility, strength and mobility.  Verbal and tactile cues throughout for technique.  UBE - L2.0 x 6 min (3' each fwd & back) 3-way doorway pec stretch at 60/90/120 2 x 30" each Seated rhomboid stretch with arms folded across chest 2 x 30"   Seated rhomboid/upper thoracic stretch with hands clasped and shoulders protracted with fwd bend 2 x 30" Seated 3-way thoracolumbar flexion stretch with hands supported on Swiffer handle x 30" each position (Patient plans to try this using hands on desk with rolling desk chair at home - HEP instructions describe using Pball)  THERAPEUTIC ACTIVITIES: NDI: 13 / 50 = 26.0 %   04/21/22 THERAPEUTIC EXERCISE: to improve flexibility, strength and mobility.  Verbal and tactile cues throughout for technique.  UBE - L2.0 x 6 min (3' each fwd & back) Supine serratus punch 2# 2x10 bil Horiz  ABD RTB supine x 10  Alt diagonals YTB x 10 supine Shoulder strength, cervical and lumbar AROM assessed  Manual therapy: IASTM to thoracic PS, rhomboids with foam roll   PATIENT EDUCATION:  Education details: HEP update, HEP review, and HEP consolidation Person educated: Patient Education method: Explanation, Demonstration, and Handouts Education comprehension: verbalized understanding, returned demonstration, and needs further education  HOME EXERCISE PROGRAM: Access Code: [email or web address] URL: [email or web address] Date: 04/23/2022 Prepared by: Annie Paras  Exercises - Seated Scapular Retraction  -  2 x daily - 7 x weekly - 2 sets - 10 reps - 3-5 sec hold - Seated Scapular Retraction with External Rotation  - 2 x daily - 7 x weekly - 2 sets - 10 reps - 3 sec hold - Standing Shoulder Row with Anchored Resistance  - 1 x daily - 7 x weekly - 3 sets - 10 reps - Shoulder Extension with Anchored Resistance  - 1 x daily - 7 x weekly - 3 sets - 10 reps - Sidelying Thoracic Rotation with Open Book  - 1 x daily - 7 x weekly - 2 sets - 10 reps - 5 sec hold - Doorway Pec Stretch at 60 Degrees Abduction with Arm Straight  - 2-3 x daily - 7 x weekly - 3 reps - 30 sec hold - Doorway Pec Stretch at 90 Degrees Abduction  - 2 x daily - 7 x weekly - 3 reps - 30 sec hold - Doorway Pec Stretch at 120 Degrees Abduction  - 2 x daily - 7 x weekly - 3 reps - 30 sec hold - Seated Rhomboid Stretch  - 1 x daily - 7 x weekly - 2 sets - 10 reps - 3 sec hold - Standing Lower Cervical and Upper Thoracic Stretch  - 1 x daily - 7 x weekly - 3 reps - 30 sec hold - Seated Flexion Stretch with Swiss Ball  - 2 x daily - 7 x weekly - 3 reps - 30 sec hold - Seated Thoracic Flexion and Rotation with Swiss Ball  - 2 x daily - 7 x weekly - 3 reps - 30 sec hold - Child's Pose Stretch  - 2 x daily - 7 x weekly - 3 reps - 30 sec hold - Child's Pose with Sidebending  - 2 x daily - 7 x weekly - 2-3 reps - 30 sec hold -  Hooklying Hamstring Stretch with Strap  - 2 x daily - 7 x weekly - 3 reps - 30 sec hold - Supine Posterior Pelvic Tilt  - 1 x daily - 7 x weekly - 2 sets - 10 reps - 5 sec hold - Supine March with Resistance Band  - 1 x daily - 3 x weekly - 2 sets - 10 reps - 2-3 sec hold hold - Hooklying Single Leg Bent Knee Fallouts with Resistance  - 1 x daily - 3 x weekly - 2 sets - 10 reps - 3 sec hold - Bridge with Resistance  - 1 x daily - 3 x weekly - 2 sets - 10 reps - 5 sec hold - Clam with Resistance  - 1 x daily - 3 x weekly - 2 sets - 10 reps - 3-5 sec hold  Patient Education - Posture and Body Mechanics   ASSESSMENT:  CLINICAL IMPRESSION:  Jillian Hayes reports that she is comfortable with her current established home program after condensing last session.  She is still experiencing sleep interruption , reports only 1 hr of sleep last night.  Added other self mobilization as described above as well as dry needling today.  Some relief noted with the stretching with pool noodle.  To try again at home and then will discuss with JoAnne at her next appt.   OBJECTIVE IMPAIRMENTS: decreased activity tolerance, decreased knowledge of condition, decreased mobility, difficulty walking, decreased ROM, decreased strength, hypomobility, increased fascial restrictions, impaired perceived functional ability, increased muscle spasms, impaired flexibility, impaired UE functional use, improper body mechanics, postural dysfunction, and pain.  ACTIVITY LIMITATIONS: carrying, lifting, bending, sitting, standing, squatting, sleeping, reach over head, hygiene/grooming, and caring for others  PARTICIPATION LIMITATIONS: meal prep, cleaning, laundry, driving, shopping, and community activity  PERSONAL FACTORS: Age, Past/current experiences, Time since onset of injury/illness/exacerbation, and 3+ comorbidities: mild scoliosis; OA - R knee, hands; lumbar DDD; anemia; a-fib; aortic valve disease; HTN; ESRD - HD MWF; hearing loss;  anxiety; depression; chronic headaches; insomnia; fatigue  are also affecting patient's functional outcome.   REHAB POTENTIAL: Good  CLINICAL DECISION MAKING: Evolving/moderate complexity  EVALUATION COMPLEXITY: Moderate   GOALS: Goals reviewed with patient? Yes  SHORT TERM GOALS: Target date: 04/02/2022  Patient will be independent with initial HEP to improve outcomes and carryover.  Baseline:  Goal status: MET  03/24/22  2.  Patient will be able to verbalize and demonstrate proper posture with sitting/standing to decrease/reduce muscle tightness/reinjury .  Baseline:  Goal status: MET  04/02/22  LONG TERM GOALS: Target date: 04/30/2022  Patient will be independent with ongoing/advanced HEP for self-management at home.  Baseline:  Goal status: IN PROGRESS  04/23/22 - initiated final HEP review/update/consolidation  2.  Patient will report 75% improvement in back and periscapular pain pain to improve QOL.  Baseline:  Goal status: IN PROGRESS  04/23/22 - pt reports "some days 75%, some days 50%"  3.  Patient to demonstrate ability to achieve and maintain good spinal alignment/posturing and body mechanics needed for daily activities. Baseline:  Goal status: PARTIALLY MET  04/21/22 - good upright posture seen today, cues required to keep shoulders depressed during exercises  4.  Patient will demonstrate functional pain free cervical and lumbar ROM to perform ADLs.   Baseline:  refer to above ROM tables Goal status: IN PROGRESS  04/21/22 - Cervical ROM WFL but still with discomfort during bil rotation and R side bend  5.  Patient will demonstrate improved proximal B LE strength to >/= 4+/5 for improved stability and ease of mobility . Baseline: refer to above LE MMT table Goal status: PARTIALLY MET  04/09/22 - greatest weakness still proximally  6.  Patient will demonstrate improved B shoulder and scapular strength to >/= 4+/5 for functional UE use w/o pain interference.  Baseline:  refer to above UE MMT table Goal status:  MET  04/21/22  7. Patient will report </= 21% on NDI to demonstrate improved functional ability with decreased pain interference. Baseline: 18 / 50 = 36.0 % Goal status: IN PROGRESS  04/23/22 - 13 / 50 = 26.0 %  8.  Patient will tolerate 3 hours of sitting w/o increased pain to allow for improved activity tolerance during HD treatments. Baseline:  Goal status: IN PROGRESS  04/23/22 - Pt typically able to sit for 2 hrs before increased pain during HD   9.  Patient will report </= 25% sleep disturbance due to back or periscapular pain. Baseline: sleep disturbed for up to 3-5 hrs per NDI Goal status: IN PROGRESS  04/23/22 - Pt reports sleep disturbance up to 90%   PLAN:  PT FREQUENCY: 2x/week  PT DURATION: 8 weeks  PLANNED INTERVENTIONS: Therapeutic exercises, Therapeutic activity, Neuromuscular re-education, Balance training, Gait training, Patient/Family education, Self Care, Joint mobilization, Aquatic Therapy, Dry Needling, Electrical stimulation, Spinal manipulation, Cryotherapy, Moist heat, Taping, Ultrasound, Manual therapy, and Re-evaluation  PLAN FOR NEXT SESSION: Complete HEP, assess effectiveness of dry needling and self mobs as indicated.   Sofhia Ulibarri L Sharica Roedel, PT 04/28/2022, 12:28 PM    After explanation of DN rational, procedures, outcomes and potential  side effects, (including precautions with DN over the lung fields,) patient verbalized consent to DN treatment in conjunction with manual STM/DTM and TPR to reduce ttp/muscle tension. Muscles treated as indicated above. DN produced normal response with good twitches elicited resulting in palpable reduction in pain/ttp and muscle tension. Pt educated to expect mild to moderate muscle soreness for up to 24-48 hrs and instructed to continue prescribed home exercise program and current activity level with pt verbalizing understanding of theses instructions. Will plan to revisit readiness to  transition to HEP as originally planned for next visit vs need for recert.  Percival Spanish, PT 04/28/22, 12:37 PM  [facility] [address]  [place], [place], [address] Phone: [phone]   Fax:  [phone]

## 2022-04-29 DIAGNOSIS — D631 Anemia in chronic kidney disease: Secondary | ICD-10-CM | POA: Diagnosis not present

## 2022-04-29 DIAGNOSIS — Z23 Encounter for immunization: Secondary | ICD-10-CM | POA: Diagnosis not present

## 2022-04-29 DIAGNOSIS — D509 Iron deficiency anemia, unspecified: Secondary | ICD-10-CM | POA: Diagnosis not present

## 2022-04-29 DIAGNOSIS — N186 End stage renal disease: Secondary | ICD-10-CM | POA: Diagnosis not present

## 2022-04-29 DIAGNOSIS — N2581 Secondary hyperparathyroidism of renal origin: Secondary | ICD-10-CM | POA: Diagnosis not present

## 2022-04-30 ENCOUNTER — Encounter: Payer: Self-pay | Admitting: Physical Therapy

## 2022-04-30 ENCOUNTER — Ambulatory Visit: Payer: Medicare HMO | Admitting: Physical Therapy

## 2022-04-30 DIAGNOSIS — M546 Pain in thoracic spine: Secondary | ICD-10-CM

## 2022-04-30 DIAGNOSIS — M5459 Other low back pain: Secondary | ICD-10-CM

## 2022-04-30 DIAGNOSIS — M62830 Muscle spasm of back: Secondary | ICD-10-CM | POA: Diagnosis not present

## 2022-04-30 DIAGNOSIS — R293 Abnormal posture: Secondary | ICD-10-CM

## 2022-04-30 DIAGNOSIS — M6281 Muscle weakness (generalized): Secondary | ICD-10-CM | POA: Diagnosis not present

## 2022-04-30 NOTE — Therapy (Addendum)
OUTPATIENT PHYSICAL THERAPY TREATMENT / DISCHARGE SUMMARY  Progress Note  Reporting Period 04/21/2022 to 04/30/2022   See note below for Objective Data and Assessment of Progress/Goals.     Patient Name: Jillian Hayes MRN: [id number] DOB:07/21/1945, 77 y.o., female Today's Date: 04/30/2022  END OF SESSION:  PT End of Session - 04/30/22 1400     Visit Number 13    Date for PT Re-Evaluation 04/30/22    Authorization Type [employer]    PT Start Time 1400    PT Stop Time 1446    PT Time Calculation (min) 46 min    Activity Tolerance Patient tolerated treatment well    Behavior During Therapy [facility] for tasks assessed/performed                      Past Medical History:  Diagnosis Date   Advanced care planning/counseling discussion 06/10/2014   05/31/2014 patient presents copy of HCP and Living Will   Anemia    iron deficiency   Anxiety    Arthritis    "in hands"   Atrial fibrillation (HCC)    Benign fundic gland polyps of stomach    Bladder polyps 06/25/2010   Chronic headaches 06/24/2010   Complication of anesthesia    "woke up at the end of a cyst removal surgery in 1991"   Depression 1991   hospitalized   Diverticulosis    ESRD (end stage renal disease) (HCC) 11/09/2015   HD on MWF   External prolapsed hemorrhoids    History of chicken pox 06/25/2010   Hypercalcemia 02/18/2014   Hypertension    Insomnia 06/24/2010   Multiple chemical sensitivity syndrome 06/25/2010   Proteinuria 02/18/2014   Renal insufficiency 03/26/2011   Valvular heart disease 04/28/2016   Past Surgical History:  Procedure Laterality Date   AV FISTULA PLACEMENT Left    x4   COLONOSCOPY  2014   cyst on left breast removed Left 1991   benign   ESOPHAGOGASTRODUODENOSCOPY (EGD) WITH ESOPHAGEAL DILATION  2014   LAPAROSCOPIC BILATERAL SALPINGO OOPHERECTOMY Bilateral 08/20/2020   Procedure: LAPAROSCOPIC BILATERAL SALPINGO OOPHORECTOMY;  Surgeon: Jerene Bears, MD;  Location:  [facility];  Service: Gynecology;  Laterality: Bilateral;   NASAL SEPTUM SURGERY  1986   rhinoplasty   polyps on bladder removed  1972   benign   RIGHT HEART CATH N/A 04/10/2021   Procedure: RIGHT HEART CATH;  Surgeon: Laurey Morale, MD;  Location: [facility] INVASIVE CV LAB;  Service: Cardiovascular;  Laterality: N/A;   TONSILLECTOMY  1962   Patient Active Problem List   Diagnosis Date Noted   Pulmonary hypertension, primary (HCC) 03/09/2021   Low TSH level 03/09/2021   Right knee pain 03/06/2021   Primary osteoarthritis of right knee 02/25/2021   Adnexal mass    A-V fistula (HCC) 08/03/2019   Nonrheumatic mitral valve regurgitation 06/12/2019   Edema 04/09/2019   Drug-induced constipation 10/18/2018   External prolapsed hemorrhoids 09/25/2018   Hyperphosphatemia 03/09/2018   Leg swelling 03/03/2018   Long term (current) use of anticoagulants 11/01/2017   Atrial fibrillation (HCC) 10/25/2017   Hyperparathyroidism, unspecified (HCC) 05/04/2017   DDD (degenerative disc disease), lumbar 04/07/2017   Hearing loss 01/04/2017   Hyperlipidemia 01/04/2017   Arthritis of fingers of both hands 10/01/2016   Chronic rhinitis 10/01/2016   Nonrheumatic aortic valve insufficiency 09/29/2016   Bruit 09/29/2016   Palpitation 09/22/2016   Aortic valve disease 04/28/2016   Anemia in ESRD (end-stage renal disease) (HCC)  03/03/2016   Sun-damaged skin 01/21/2016   ESRD (end stage renal disease) (HCC) 11/09/2015   Advanced care planning/counseling discussion 06/10/2014   Pedal edema 03/28/2014   Proteinuria 02/18/2014   Hypercalcemia 02/18/2014   Abnormal EKG 01/22/2014   HTN (hypertension) 01/22/2014   Dermatitis 06/30/2013   Abnormal thyroid function test 06/25/2013   Cervical cancer screening 06/10/2012   Preventative health care 11/09/2011   Multiple chemical sensitivity syndrome 06/25/2010   History of chicken pox 06/25/2010   Bladder polyps 06/25/2010   History of cardiac dysrhythmia  06/25/2010   Insomnia 06/24/2010   Fatigue 06/24/2010   Chronic headaches 06/24/2010   Allergy    Anemia    Depression     PCP: Bradd Canary, MD   REFERRING PROVIDER: Clayborne Dana, NP   REFERRING DIAG: M54.9 (ICD-10-CM) - Upper back pain   THERAPY DIAG:  Pain in thoracic spine  Abnormal posture  Muscle weakness (generalized)  Muscle spasm of back  Other low back pain  RATIONALE FOR EVALUATION AND TREATMENT: Rehabilitation  ONSET DATE: late Nov 2023  NEXT MD VISIT: 05/05/22 with PCP   SUBJECTIVE:                                                                                                                                                                                                         SUBJECTIVE STATEMENT:   Pt reports slept well after the DN last visit and did not have any pain yesterday, but notes pain again today. Had difficulty when she was working on the computer this morning.   PAIN: Are you having pain? Yes: NPRS scale:  7/10 Pain location: R upper shoulder and scapula Pain description: sore Aggravating factors: at night worse, only sleeps 2 hours, dishes, cooking, getting things in/out of dryer, driving Relieving factors: deep massage, icy/hot, DN  Are you having pain? Yes: NPRS scale: intermittently 2/10 Pain location: L low back just above waist Pain description: stiff, grabs periodically Aggravating factors: getting into the car, bending over at sink, washer and dryer Relieving factors: stretches, ice  PERTINENT HISTORY:  mild scoliosis; OA - R knee, hands; lumbar DDD; anemia; a-fib; aortic valve disease; HTN; ESRD - HD MWF; hearing loss; anxiety; depression; chronic headaches; insomnia; fatigue  PRECAUTIONS: Other: HD MWF - fistula in L upper arm  WEIGHT BEARING RESTRICTIONS: No  FALLS:  Has patient fallen in last 6 months? No  LIVING ENVIRONMENT: Lives with: lives with their spouse and lives with their daughter Lives in:  House/apartment Stairs: Yes: Internal: 14 steps; on right going  up (dtr lives upstairs, she rarely goes up) Has following equipment at home: shower chair  OCCUPATION: Retired  PLOF: Independent and Leisure: reading on Kindle, walking when she has the energy ~2x/wk or when shopping  PATIENT GOALS: "To be able to sleep w/o back hurting. To be able to sit through HD and do things around the house w/o pain."   OBJECTIVE:   DIAGNOSTIC FINDINGS:  03/03/22 - Thoracic spine x-ray: IMPRESSION: 1. No acute fracture. 2. Mild degenerative changes in the thoracic spine.  PATIENT SURVEYS:  NDI 18 / 50 = 36.0 % ; 13 / 50 = 26.0 % (04/23/22)  COGNITION:  Overall cognitive status: Within functional limits for tasks assessed    SENSATION: WFL  MUSCLE LENGTH: Hamstrings: mild/mod tight L>R ITB: mod tight R Piriformis: mod tight R Hip flexors: mod tight R, mild tight L Quads: mod tight R, mild tight L Heelcord: NT  POSTURE:  rounded shoulders, forward head, increased thoracic kyphosis, and mils scoliosis  PALPATION: TTP with increased muscle tension and taut bands in R>L thoracolumbar paraspinals, L >R cervicothoracic paraspinals and periscapular muscles   CERVICAL ROM:   Active ROM AROM (deg) eval AROM 04/07/22 AROM 04/21/22 AROM 04/30/22  Flexion 56 58 WFL WNL  Extension 24 38 WFL WFL  Right lateral flexion 38 42 WFL- discomfort on L side WFL - tight on L  Left lateral flexion 20 28 WFL- discomfort L side WFL  Right rotation 56 61 WFL- discomfort L side WFL - tight on L  Left rotation 71 66 WFL- discomfort on L side WFL - tight on L   (Blank rows = not tested)  UPPER EXTREMITY ROM:  Active ROM Right eval Left eval Right 04/07/22 Left    04/07/22 Left 04/30/22 Right 04/30/22  Shoulder flexion WNL WNL WNL WNL WNL WNL  Shoulder extension 48 40 48 45 WNL WNL  Shoulder abduction 170 144 170 154 WNL WNL  Shoulder adduction        Shoulder internal rotation FIR WNL FIR WNL   FIR WNL FIR  WNL  Shoulder external rotation FER WNL FER WNL   FER WNL FER WNL  Elbow flexion        Elbow extension        Wrist flexion        Wrist extension        Wrist ulnar deviation        Wrist radial deviation        Wrist pronation        Wrist supination         (Blank rows = not tested)   LUMBAR ROM:   Active  AROM  eval AROM 04/21/22 AROM 04/30/22  Flexion Hands to ankles [facility] WFL  Extension 50% limited Limited 25% WFL  Right lateral flexion Hand to lateral knee Lateral knee WNL  Left lateral flexion Hand to lateral knee Tight - hand to lateral knee WNL  Right rotation 50% limited WFL WNL  Left rotation 50% limited WFL WNL  (Blank rows = not tested)  LOWER EXTREMITY ROM:    Grossly WFL/WNL  UPPER EXTREMITY MMT:  MMT Right eval Left eval Right 04/07/22 Left 04/07/22 Rt and Lt 04/21/22 Right 04/30/22 Left 04/30/22  Shoulder flexion 4+ 4- * 4+ 4 4+ 5 5  Shoulder extension 4+ 4 4+ 4+  5 5  Shoulder abduction 4 * 4- * 4+ 4+ 4+ 5 5  Shoulder adduction  Shoulder internal rotation 4+ 4 5 4+ 4+ 5 5  Shoulder external rotation 4+ 4- 4+ 4+ 4+ 4+ 4+  Middle trapezius 4 4- 4 4 4+ 4+ 4+  Lower trapezius      4 4  Elbow flexion         Elbow extension         Wrist flexion         Wrist extension         Wrist ulnar deviation         Wrist radial deviation         Wrist pronation         Wrist supination         Grip strength          (Blank rows = not tested)  LOWER EXTREMITY MMT:    MMT Right eval Left eval Right 04/09/22 Left 04/09/22 Right 04/30/22 Left 04/30/22  Hip flexion 4- 4- 4- 4- 4+ 4+  Hip extension unable unable 4 4 4 4   Hip abduction 4- 4- 4 4- 4 4  Hip adduction 4 4- 4+ 4 4+ 4  Hip internal rotation 4 4- 4+ 4+ 4+ 4+  Hip external rotation 4- 4- 4- 4- 4 4  Knee flexion 5 5 5 5 5 5   Knee extension 5 5 5 5 5 5   Ankle dorsiflexion 4- 4- 4+ 4- 5 5  Ankle plantarflexion        Ankle inversion        Ankle eversion         (Blank rows = not  tested)   TODAY'S TREATMENT:   04/30/22 THERAPEUTIC EXERCISE: to improve flexibility, strength and mobility.  Verbal and tactile cues throughout for technique.  UBE - L2.0 x 6 min (3' each fwd & back)  THERAPEUTIC ACTIVITIES: Cervical, shoulder and lumbar ROM assessment UE and LE MMT Goal assessment   04/28/22: Therapeutic exercise:  to improve posture,proprioception,flexibility . Patient indicates that she is comfortable with current home program as established and condensed last visit.  Added other exercises today specific to elongate and self mobilize her thoracic spine utilizing a 1/2 thoracic foam roller, and a small swim noodle.  Instructed in supine stretch with foam parallel to thoracic spine, for pec minor, pec major, and lat dorsi.  Instructed in static prolonged stretch and dynamic moving stretches, with pec flys, also B shoulder flexion overhead stretch and alternating shoulder flexion stretch.  Added 1 # wts for trial as well to each hand for additional overpressure, dynamic stretching.  Alternated between static holds up to 2 min, and slow dynamic stretching 10 reps each side  Dry needling performed by Glenetta Hew, PT. MANUAL THERAPY: To promote normalized muscle tension, improved flexibility, and reduced pain. Skilled palpation and monitoring of soft tissue during DN Trigger Point Dry-Needling  Treatment instructions: Expect mild to moderate muscle soreness. S/S of pneumothorax if dry needled over a lung field, and to seek immediate medical attention should they occur. Patient verbalized understanding of these instructions and education. Patient Consent Given: Yes Education handout provided: Previously provided Muscles treated: B rhomboids, subscapularis, LS and UT Electrical stimulation performed: No Parameters: N/A Treatment response/outcome: Twitch Response Elicited and Palpable Increase in Muscle Length STM/DTM, manual TPR and pin & stretch to muscles addressed with  DN   04/23/22 THERAPEUTIC EXERCISE: to improve flexibility, strength and mobility.  Verbal and tactile cues throughout for technique.  UBE - L2.0 x 6 min (3' each fwd &  back) 3-way doorway pec stretch at 60/90/120 2 x 30" each Seated rhomboid stretch with arms folded across chest 2 x 30"   Seated rhomboid/upper thoracic stretch with hands clasped and shoulders protracted with fwd bend 2 x 30" Seated 3-way thoracolumbar flexion stretch with hands supported on Swiffer handle x 30" each position (Patient plans to try this using hands on desk with rolling desk chair at home - HEP instructions describe using Pball)  THERAPEUTIC ACTIVITIES: NDI: 13 / 50 = 26.0 %   PATIENT EDUCATION:  Education details: HEP update, HEP review, and HEP consolidation Person educated: Patient Education method: Explanation, Demonstration, and Handouts Education comprehension: verbalized understanding, returned demonstration, and needs further education  HOME EXERCISE PROGRAM: Access Code: [email or web address] URL: [email or web address] Date: 04/23/2022 Prepared by: Glenetta Hew  Exercises - Seated Scapular Retraction  - 2 x daily - 7 x weekly - 2 sets - 10 reps - 3-5 sec hold - Seated Scapular Retraction with External Rotation  - 2 x daily - 7 x weekly - 2 sets - 10 reps - 3 sec hold - Standing Shoulder Row with Anchored Resistance  - 1 x daily - 7 x weekly - 3 sets - 10 reps - Shoulder Extension with Anchored Resistance  - 1 x daily - 7 x weekly - 3 sets - 10 reps - Sidelying Thoracic Rotation with Open Book  - 1 x daily - 7 x weekly - 2 sets - 10 reps - 5 sec hold - Doorway Pec Stretch at 60 Degrees Abduction with Arm Straight  - 2-3 x daily - 7 x weekly - 3 reps - 30 sec hold - Doorway Pec Stretch at 90 Degrees Abduction  - 2 x daily - 7 x weekly - 3 reps - 30 sec hold - Doorway Pec Stretch at 120 Degrees Abduction  - 2 x daily - 7 x weekly - 3 reps - 30 sec hold - Seated Rhomboid Stretch  - 1 x daily -  7 x weekly - 2 sets - 10 reps - 3 sec hold - Standing Lower Cervical and Upper Thoracic Stretch  - 1 x daily - 7 x weekly - 3 reps - 30 sec hold - Seated Flexion Stretch with Swiss Ball  - 2 x daily - 7 x weekly - 3 reps - 30 sec hold - Seated Thoracic Flexion and Rotation with Swiss Ball  - 2 x daily - 7 x weekly - 3 reps - 30 sec hold - Child's Pose Stretch  - 2 x daily - 7 x weekly - 3 reps - 30 sec hold - Child's Pose with Sidebending  - 2 x daily - 7 x weekly - 2-3 reps - 30 sec hold - Hooklying Hamstring Stretch with Strap  - 2 x daily - 7 x weekly - 3 reps - 30 sec hold - Supine Posterior Pelvic Tilt  - 1 x daily - 7 x weekly - 2 sets - 10 reps - 5 sec hold - Supine March with Resistance Band  - 1 x daily - 3 x weekly - 2 sets - 10 reps - 2-3 sec hold hold - Hooklying Single Leg Bent Knee Fallouts with Resistance  - 1 x daily - 3 x weekly - 2 sets - 10 reps - 3 sec hold - Bridge with Resistance  - 1 x daily - 3 x weekly - 2 sets - 10 reps - 5 sec hold - Clam with Resistance  - 1 x daily - 3  x weekly - 2 sets - 10 reps - 3-5 sec hold  Patient Education - Posture and Body Mechanics   ASSESSMENT:  CLINICAL IMPRESSION:  Layce reports significant improvement in her pain although still finds herself limited with being able to sit through her 3-hour dialysis treatments and also continues to note regular sleep disturbance pain.  She demonstrates improved postural awareness but admits to less than ideal posture at times, particularly when on computer.  Her overall cervical, shoulder, and lumbar range of motion are now WFL/WNL but some continued tightness noted with cervical ROM. Her overall B shoulder strength is now 4+ to 5/5 with only exception being lower traps at 4/5. LE strength also improved but mild weakness still evident at hips. Maddi has met or partially met the majority of her LTGs with progress observed for all of the remaining goals except sleep disturbance, although she notes that  sometimes she is unable to sleep even w/o pain. She feels comfortable with her HEP and denies need for further review. Jovani would like to try transitioning to her HEP but will remain on hold for 30-days in the event that issues arise that would necessitate a return to PT.   OBJECTIVE IMPAIRMENTS: decreased activity tolerance, decreased knowledge of condition, decreased mobility, difficulty walking, decreased ROM, decreased strength, hypomobility, increased fascial restrictions, impaired perceived functional ability, increased muscle spasms, impaired flexibility, impaired UE functional use, improper body mechanics, postural dysfunction, and pain.   ACTIVITY LIMITATIONS: carrying, lifting, bending, sitting, standing, squatting, sleeping, reach over head, hygiene/grooming, and caring for others  PARTICIPATION LIMITATIONS: meal prep, cleaning, laundry, driving, shopping, and community activity  PERSONAL FACTORS: Age, Past/current experiences, Time since onset of injury/illness/exacerbation, and 3+ comorbidities: mild scoliosis; OA - R knee, hands; lumbar DDD; anemia; a-fib; aortic valve disease; HTN; ESRD - HD MWF; hearing loss; anxiety; depression; chronic headaches; insomnia; fatigue  are also affecting patient's functional outcome.   REHAB POTENTIAL: Good  CLINICAL DECISION MAKING: Evolving/moderate complexity  EVALUATION COMPLEXITY: Moderate   GOALS: Goals reviewed with patient? Yes  SHORT TERM GOALS: Target date: 04/02/2022  Patient will be independent with initial HEP to improve outcomes and carryover.  Baseline:  Goal status: MET  03/24/22  2.  Patient will be able to verbalize and demonstrate proper posture with sitting/standing to decrease/reduce muscle tightness/reinjury .  Baseline:  Goal status: MET  04/02/22  LONG TERM GOALS: Target date: 04/30/2022  Patient will be independent with ongoing/advanced HEP for self-management at home.  Baseline:  Goal status: MET  04/30/22  2.   Patient will report 75% improvement in back and periscapular pain pain to improve QOL.  Baseline:  Goal status: PARTIALLY MET  04/23/22 - pt reports "some days 75%, some days 50%"  3.  Patient to demonstrate ability to achieve and maintain good spinal alignment/posturing and body mechanics needed for daily activities. Baseline:  Goal status: MET  04/30/22   4.  Patient will demonstrate functional pain free cervical and lumbar ROM to perform ADLs.   Baseline:  refer to above ROM tables Goal status: PARTIALLY MET  04/30/22 - Cervical ROM WFL but still with discomfort/tightness during B rotation and R side bend  5.  Patient will demonstrate improved proximal B LE strength to >/= 4+/5 for improved stability and ease of mobility . Baseline: refer to above LE MMT table Goal status: PARTIALLY MET  04/30/22 - met except B hip ABD, extension, ER and L hip ADD 4/5  6.  Patient will demonstrate improved B  shoulder and scapular strength to >/= 4+/5 for functional UE use w/o pain interference.  Baseline: refer to above UE MMT table Goal status:  MET  04/21/22  7. Patient will report </= 21% on NDI to demonstrate improved functional ability with decreased pain interference. Baseline: 18 / 50 = 36.0 % Goal status: NOT MET  04/23/22 - improved to 13 / 50 = 26.0 %  8.  Patient will tolerate 3 hours of sitting w/o increased pain to allow for improved activity tolerance during HD treatments. Baseline:  Goal status: NOT MET  04/23/22 - Pt typically able to sit for 1.5-2 hrs before increased pain during HD   9.  Patient will report </= 25% sleep disturbance due to back or periscapular pain. Baseline: sleep disturbed for up to 3-5 hrs per NDI Goal status: NOT MET  04/30/22 - sleep disturbed for up to 5-7 hrs per NDI, with pt reporting she is only able to sleep in 1-2 hr blocks although sometimes pain is not what keeps her awake   PLAN:  PT FREQUENCY: 2x/week  PT DURATION: 8 weeks  PLANNED INTERVENTIONS:  Therapeutic exercises, Therapeutic activity, Neuromuscular re-education, Balance training, Gait training, Patient/Family education, Self Care, Joint mobilization, Aquatic Therapy, Dry Needling, Electrical stimulation, Spinal manipulation, Cryotherapy, Moist heat, Taping, Ultrasound, Manual therapy, and Re-evaluation  PLAN FOR NEXT SESSION: transition to HEP + 30-day hold   Marry Guan, PT 04/30/2022, 5:24 PM     PHYSICAL THERAPY DISCHARGE SUMMARY  Visits from Start of Care: 13  Current functional level related to goals / functional outcomes: Refer to above clinical impression and goal assessment for status as of last visit on 04/30/22. Patient was placed on hold for 30 days and has not needed to return to PT, therefore will proceed with discharge from PT for this episode.    Remaining deficits: As above.    Education / Equipment: HEP   Patient agrees to discharge. Patient goals were partially met. Patient is being discharged due to being pleased with the current functional level.  Marry Guan, PT 07/30/22, 5:25 PM  [facility] [address]  [address] [place], [place], [address] Phone: [phone]   Fax:  [phone]

## 2022-05-01 DIAGNOSIS — N186 End stage renal disease: Secondary | ICD-10-CM | POA: Diagnosis not present

## 2022-05-01 DIAGNOSIS — D509 Iron deficiency anemia, unspecified: Secondary | ICD-10-CM | POA: Diagnosis not present

## 2022-05-01 DIAGNOSIS — Z23 Encounter for immunization: Secondary | ICD-10-CM | POA: Diagnosis not present

## 2022-05-01 DIAGNOSIS — N2581 Secondary hyperparathyroidism of renal origin: Secondary | ICD-10-CM | POA: Diagnosis not present

## 2022-05-01 DIAGNOSIS — D631 Anemia in chronic kidney disease: Secondary | ICD-10-CM | POA: Diagnosis not present

## 2022-05-03 DIAGNOSIS — Z992 Dependence on renal dialysis: Secondary | ICD-10-CM | POA: Diagnosis not present

## 2022-05-03 DIAGNOSIS — N186 End stage renal disease: Secondary | ICD-10-CM | POA: Diagnosis not present

## 2022-05-04 DIAGNOSIS — N186 End stage renal disease: Secondary | ICD-10-CM | POA: Diagnosis not present

## 2022-05-04 DIAGNOSIS — Z7901 Long term (current) use of anticoagulants: Secondary | ICD-10-CM | POA: Diagnosis not present

## 2022-05-04 DIAGNOSIS — N2581 Secondary hyperparathyroidism of renal origin: Secondary | ICD-10-CM | POA: Diagnosis not present

## 2022-05-04 DIAGNOSIS — D509 Iron deficiency anemia, unspecified: Secondary | ICD-10-CM | POA: Diagnosis not present

## 2022-05-04 DIAGNOSIS — D631 Anemia in chronic kidney disease: Secondary | ICD-10-CM | POA: Diagnosis not present

## 2022-05-04 NOTE — Assessment & Plan Note (Signed)
Increase leafy greens, consider increased lean red meat and using cast iron cookware. Continue to monitor, report any concerns following with nephrology

## 2022-05-04 NOTE — Assessment & Plan Note (Signed)
Encourage heart healthy diet such as MIND or DASH diet, increase exercise, avoid trans fats, simple carbohydrates and processed foods, consider a krill or fish or flaxseed oil cap daily.  °

## 2022-05-04 NOTE — Assessment & Plan Note (Signed)
Well controlled, no changes to meds. Encouraged heart healthy diet such as the DASH diet and exercise as tolerated.  °

## 2022-05-04 NOTE — Assessment & Plan Note (Signed)
Works with nephrology in [employer]

## 2022-05-05 ENCOUNTER — Ambulatory Visit (INDEPENDENT_AMBULATORY_CARE_PROVIDER_SITE_OTHER): Payer: Medicare HMO | Admitting: Family Medicine

## 2022-05-05 ENCOUNTER — Ambulatory Visit (INDEPENDENT_AMBULATORY_CARE_PROVIDER_SITE_OTHER): Payer: Medicare HMO | Admitting: *Deleted

## 2022-05-05 ENCOUNTER — Ambulatory Visit (HOSPITAL_BASED_OUTPATIENT_CLINIC_OR_DEPARTMENT_OTHER)
Admission: RE | Admit: 2022-05-05 | Discharge: 2022-05-05 | Disposition: A | Discharge status: Home / Self Care | Payer: Medicare HMO | Source: Ambulatory Visit | Attending: Family Medicine | Admitting: Family Medicine

## 2022-05-05 VITALS — BP 142/64 | HR 77 | Temp 98.1°F | Resp 16 | Ht 66.0 in | Wt 136.4 lb

## 2022-05-05 VITALS — Ht 66.0 in | Wt 136.0 lb

## 2022-05-05 DIAGNOSIS — M542 Cervicalgia: Secondary | ICD-10-CM | POA: Diagnosis not present

## 2022-05-05 DIAGNOSIS — I480 Paroxysmal atrial fibrillation: Secondary | ICD-10-CM

## 2022-05-05 DIAGNOSIS — E782 Mixed hyperlipidemia: Secondary | ICD-10-CM

## 2022-05-05 DIAGNOSIS — N186 End stage renal disease: Secondary | ICD-10-CM

## 2022-05-05 DIAGNOSIS — I10 Essential (primary) hypertension: Secondary | ICD-10-CM | POA: Diagnosis not present

## 2022-05-05 DIAGNOSIS — R739 Hyperglycemia, unspecified: Secondary | ICD-10-CM | POA: Diagnosis not present

## 2022-05-05 DIAGNOSIS — Z0000 Encounter for general adult medical examination without abnormal findings: Secondary | ICD-10-CM | POA: Diagnosis not present

## 2022-05-05 DIAGNOSIS — M546 Pain in thoracic spine: Secondary | ICD-10-CM

## 2022-05-05 DIAGNOSIS — D631 Anemia in chronic kidney disease: Secondary | ICD-10-CM | POA: Diagnosis not present

## 2022-05-05 DIAGNOSIS — M549 Dorsalgia, unspecified: Secondary | ICD-10-CM | POA: Diagnosis not present

## 2022-05-05 DIAGNOSIS — E538 Deficiency of other specified B group vitamins: Secondary | ICD-10-CM

## 2022-05-05 MED ORDER — CYCLOBENZAPRINE HCL 5 MG PO TABS: 5.0000 mg | ORAL_TABLET | Freq: Two times a day (BID) | ORAL | 1 refills | Status: DC | PRN

## 2022-05-05 NOTE — Patient Instructions (Signed)
Acute Back Pain, Adult Acute back pain is sudden and usually short-lived. It is often caused by an injury to the muscles and tissues in the back. The injury may result from: A muscle, tendon, or ligament getting overstretched or torn. Ligaments are tissues that connect bones to each other. Lifting something improperly can cause a back strain. Wear and tear (degeneration) of the spinal disks. Spinal disks are circular tissue that provide cushioning between the bones of the spine (vertebrae). Twisting motions, such as while playing sports or doing yard work. A hit to the back. Arthritis. You may have a physical exam, lab tests, and imaging tests to find the cause of your pain. Acute back pain usually goes away with rest and home care. Follow these instructions at home: Managing pain, stiffness, and swelling Take over-the-counter and prescription medicines only as told by your health care provider. Treatment may include medicines for pain and inflammation that are taken by mouth or applied to the skin, or muscle relaxants. Your health care provider may recommend applying ice during the first 24-48 hours after your pain starts. To do this: Put ice in a plastic bag. Place a towel between your skin and the bag. Leave the ice on for 20 minutes, 2-3 times a day. Remove the ice if your skin turns bright red. This is very important. If you cannot feel pain, heat, or cold, you have a greater risk of damage to the area. If directed, apply heat to the affected area as often as told by your health care provider. Use the heat source that your health care provider recommends, such as a moist heat pack or a heating pad. Place a towel between your skin and the heat source. Leave the heat on for 20-30 minutes. Remove the heat if your skin turns bright red. This is especially important if you are unable to feel pain, heat, or cold. You have a greater risk of getting burned. Activity  Do not stay in bed. Staying in  bed for more than 1-2 days can delay your recovery. Sit up and stand up straight. Avoid leaning forward when you sit or hunching over when you stand. If you work at a desk, sit close to it so you do not need to lean over. Keep your chin tucked in. Keep your neck drawn back, and keep your elbows bent at a 90-degree angle (right angle). Sit high and close to the steering wheel when you drive. Add lower back (lumbar) support to your car seat, if needed. Take short walks on even surfaces as soon as you are able. Try to increase the length of time you walk each day. Do not sit, drive, or stand in one place for more than 30 minutes at a time. Sitting or standing for long periods of time can put stress on your back. Do not drive or use heavy machinery while taking prescription pain medicine. Use proper lifting techniques. When you bend and lift, use positions that put less stress on your back: Bend your knees. Keep the load close to your body. Avoid twisting. Exercise regularly as told by your health care provider. Exercising helps your back heal faster and helps prevent back injuries by keeping muscles strong and flexible. Work with a physical therapist to make a safe exercise program, as recommended by your health care provider. Do any exercises as told by your physical therapist. Lifestyle Maintain a healthy weight. Extra weight puts stress on your back and makes it difficult to have good   posture. Avoid activities or situations that make you feel anxious or stressed. Stress and anxiety increase muscle tension and can make back pain worse. Learn ways to manage anxiety and stress, such as through exercise. General instructions Sleep on a firm mattress in a comfortable position. Try lying on your side with your knees slightly bent. If you lie on your back, put a pillow under your knees. Keep your head and neck in a straight line with your spine (neutral position) when using electronic equipment like  smartphones or pads. To do this: Raise your smartphone or pad to look at it instead of bending your head or neck to look down. Put the smartphone or pad at the level of your face while looking at the screen. Follow your treatment plan as told by your health care provider. This may include: Cognitive or behavioral therapy. Acupuncture or massage therapy. Meditation or yoga. Contact a health care provider if: You have pain that is not relieved with rest or medicine. You have increasing pain going down into your legs or buttocks. Your pain does not improve after 2 weeks. You have pain at night. You lose weight without trying. You have a fever or chills. You develop nausea or vomiting. You develop abdominal pain. Get help right away if: You develop new bowel or bladder control problems. You have unusual weakness or numbness in your arms or legs. You feel faint. These symptoms may represent a serious problem that is an emergency. Do not wait to see if the symptoms will go away. Get medical help right away. Call your local emergency services (911 in the [place] Do not drive yourself to the hospital. Summary Acute back pain is sudden and usually short-lived. Use proper lifting techniques. When you bend and lift, use positions that put less stress on your back. Take over-the-counter and prescription medicines only as told by your health care provider, and apply heat or ice as told. This information is not intended to replace advice given to you by your health care provider. Make sure you discuss any questions you have with your health care provider. Document Revised: 04/12/2020 Document Reviewed: 04/12/2020 Elsevier Patient Education  2023 [employer]  

## 2022-05-05 NOTE — Assessment & Plan Note (Signed)
[id number] acceptable, minimize simple carbs. Increase exercise as tolerated.  

## 2022-05-05 NOTE — Patient Instructions (Signed)
Jillian Hayes , Thank you for taking time to come for your Medicare Wellness Visit. I appreciate your ongoing commitment to your health goals. Please review the following plan we discussed and let me know if I can assist you in the future.     This is a list of the screening recommended for you and due dates:  Health Maintenance  Topic Date Due   Zoster (Shingles) Vaccine (1 of 2) Never done   DEXA scan (bone density measurement)  03/19/2022   COVID-19 Vaccine (4 - 2023-24 season) 08/28/2022*   DTaP/Tdap/Td vaccine (2 - Td or Tdap) 06/11/2022   Flu Shot  09/03/2022   Medicare Annual Wellness Visit  05/05/2023   Pneumonia Vaccine  Completed   Hepatitis C Screening: USPSTF Recommendation to screen - Ages 18-79 yo.  Completed   HPV Vaccine  Aged Out   Colon Cancer Screening  Discontinued  *Topic was postponed. The date shown is not the original due date.     Next appointment: Follow up in one year for your annual wellness visit.   Preventive Care 23 Years and Older, Female Preventive care refers to lifestyle choices and visits with your health care provider that can promote health and wellness. What does preventive care include? A yearly physical exam. This is also called an annual well check. Dental exams once or twice a year. Routine eye exams. Ask your health care provider how often you should have your eyes checked. Personal lifestyle choices, including: Daily care of your teeth and gums. Regular physical activity. Eating a healthy diet. Avoiding tobacco and drug use. Limiting alcohol use. Practicing safe sex. Taking low-dose aspirin every day. Taking vitamin and mineral supplements as recommended by your health care provider. What happens during an annual well check? The services and screenings done by your health care provider during your annual well check will depend on your age, overall health, lifestyle risk factors, and family history of disease. Counseling  Your health  care provider may ask you questions about your: Alcohol use. Tobacco use. Drug use. Emotional well-being. Home and relationship well-being. Sexual activity. Eating habits. History of falls. Memory and ability to understand (cognition). Work and work Statistician. Reproductive health. Screening  You may have the following tests or measurements: Height, weight, and BMI. Blood pressure. Lipid and cholesterol levels. These may be checked every 5 years, or more frequently if you are over 33 years old. Skin check. Lung cancer screening. You may have this screening every year starting at age 64 if you have a 30-pack-year history of smoking and currently smoke or have quit within the past 15 years. Fecal occult blood test (FOBT) of the stool. You may have this test every year starting at age 8. Flexible sigmoidoscopy or colonoscopy. You may have a sigmoidoscopy every 5 years or a colonoscopy every 10 years starting at age 49. Hepatitis C blood test. Hepatitis B blood test. Sexually transmitted disease (STD) testing. Diabetes screening. This is done by checking your blood sugar (glucose) after you have not eaten for a while (fasting). You may have this done every 1-3 years. Bone density scan. This is done to screen for osteoporosis. You may have this done starting at age 16. Mammogram. This may be done every 1-2 years. Talk to your health care provider about how often you should have regular mammograms. Talk with your health care provider about your test results, treatment options, and if necessary, the need for more tests. Vaccines  Your health care provider may  recommend certain vaccines, such as: Influenza vaccine. This is recommended every year. Tetanus, diphtheria, and acellular pertussis (Tdap, Td) vaccine. You may need a Td booster every 10 years. Zoster vaccine. You may need this after age 46. Pneumococcal 13-valent conjugate (PCV13) vaccine. One dose is recommended after age  51. Pneumococcal polysaccharide (PPSV23) vaccine. One dose is recommended after age 65. Talk to your health care provider about which screenings and vaccines you need and how often you need them. This information is not intended to replace advice given to you by your health care provider. Make sure you discuss any questions you have with your health care provider. Document Released: 02/15/2015 Document Revised: 10/09/2015 Document Reviewed: 11/20/2014 Elsevier Interactive Patient Education  2017 [place] Prevention in the Home Falls can cause injuries. They can happen to people of all ages. There are many things you can do to make your home safe and to help prevent falls. What can I do on the outside of my home? Regularly fix the edges of walkways and driveways and fix any cracks. Remove anything that might make you trip as you walk through a door, such as a raised step or threshold. Trim any bushes or trees on the path to your home. Use bright outdoor lighting. Clear any walking paths of anything that might make someone trip, such as rocks or tools. Regularly check to see if handrails are loose or broken. Make sure that both sides of any steps have handrails. Any raised decks and porches should have guardrails on the edges. Have any leaves, snow, or ice cleared regularly. Use sand or salt on walking paths during winter. Clean up any spills in your garage right away. This includes oil or grease spills. What can I do in the bathroom? Use night lights. Install grab bars by the toilet and in the tub and shower. Do not use towel bars as grab bars. Use non-skid mats or decals in the tub or shower. If you need to sit down in the shower, use a plastic, non-slip stool. Keep the floor dry. Clean up any water that spills on the floor as soon as it happens. Remove soap buildup in the tub or shower regularly. Attach bath mats securely with double-sided non-slip rug tape. Do not have throw  rugs and other things on the floor that can make you trip. What can I do in the bedroom? Use night lights. Make sure that you have a light by your bed that is easy to reach. Do not use any sheets or blankets that are too big for your bed. They should not hang down onto the floor. Have a firm chair that has side arms. You can use this for support while you get dressed. Do not have throw rugs and other things on the floor that can make you trip. What can I do in the kitchen? Clean up any spills right away. Avoid walking on wet floors. Keep items that you use a lot in easy-to-reach places. If you need to reach something above you, use a strong step stool that has a grab bar. Keep electrical cords out of the way. Do not use floor polish or wax that makes floors slippery. If you must use wax, use non-skid floor wax. Do not have throw rugs and other things on the floor that can make you trip. What can I do with my stairs? Do not leave any items on the stairs. Make sure that there are handrails on both sides of  the stairs and use them. Fix handrails that are broken or loose. Make sure that handrails are as long as the stairways. Check any carpeting to make sure that it is firmly attached to the stairs. Fix any carpet that is loose or worn. Avoid having throw rugs at the top or bottom of the stairs. If you do have throw rugs, attach them to the floor with carpet tape. Make sure that you have a light switch at the top of the stairs and the bottom of the stairs. If you do not have them, ask someone to add them for you. What else can I do to help prevent falls? Wear shoes that: Do not have high heels. Have rubber bottoms. Are comfortable and fit you well. Are closed at the toe. Do not wear sandals. If you use a stepladder: Make sure that it is fully opened. Do not climb a closed stepladder. Make sure that both sides of the stepladder are locked into place. Ask someone to hold it for you, if  possible. Clearly mark and make sure that you can see: Any grab bars or handrails. First and last steps. Where the edge of each step is. Use tools that help you move around (mobility aids) if they are needed. These include: Canes. Walkers. Scooters. Crutches. Turn on the lights when you go into a dark area. Replace any light bulbs as soon as they burn out. Set up your furniture so you have a clear path. Avoid moving your furniture around. If any of your floors are uneven, fix them. If there are any pets around you, be aware of where they are. Review your medicines with your doctor. Some medicines can make you feel dizzy. This can increase your chance of falling. Ask your doctor what other things that you can do to help prevent falls. This information is not intended to replace advice given to you by your health care provider. Make sure you discuss any questions you have with your health care provider. Document Released: 11/15/2008 Document Revised: 06/27/2015 Document Reviewed: 02/23/2014 Elsevier Interactive Patient Education  2017 [employer].

## 2022-05-05 NOTE — Assessment & Plan Note (Signed)
Rate controlled, tolerating meds 

## 2022-05-05 NOTE — Assessment & Plan Note (Signed)
Continue to monitor

## 2022-05-05 NOTE — Assessment & Plan Note (Signed)
Encouraged moist heat and gentle stretching as tolerated. May try NSAIDs and prescription meds as directed and report if symptoms worsen or seek immediate care 

## 2022-05-05 NOTE — Progress Notes (Signed)
Subjective:   By signing my name below, I, [name], attest that this documentation has been prepared under the direction and in the presence of [name], MD., 05/05/2022.   Patient ID: Jillian Hayes, female    DOB: 05-Dec-1945, 77 y.o.   MRN: [id number]  Chief Complaint  Patient presents with   Follow-up    Follow up   HPI Patient is in today for an office visit.  Fistula (upper left arm) Patient reports that the fistula on her upper left arm from where she receives dialysis has become more firm and she is requesting it to be looked at. She denies redness or warmness.  Upper Back Pain Patient reports that she is still experiencing upper back pain that worsens when doing household activities and sitting for dialysis. The pain is predominantly in the lower neck and between the shoulder blades. She continues physical therapy and using lidocaine patches as needed, though these are minimally helpful. Additionally, she was seen by Caleen Jobs, NP, on 04/23/2022 due to the pain and was prescribed Cyclobenzaprine 5 mg which she is currently taking nightly. She denies CP/palpitations/SOB/HA/fever/ chills/GI or GU symptoms.   Past Medical History:  Diagnosis Date   Advanced care planning/counseling discussion 06/10/2014   05/31/2014 patient presents copy of HCP and Living Will   Anemia    iron deficiency   Anxiety    Arthritis    "in hands"   Atrial fibrillation    Benign fundic gland polyps of stomach    Bladder polyps 06/25/2010   Chronic headaches [id number]   Complication of anesthesia    "woke up at the end of a cyst removal surgery in 1991"   Depression 1991   hospitalized   Diverticulosis    ESRD (end stage renal disease) 11/09/2015   HD on MWF   External prolapsed hemorrhoids    History of chicken pox 06/25/2010   Hypercalcemia 02/18/2014   Hypertension    Insomnia 06/24/2010   Multiple chemical sensitivity syndrome 06/25/2010   Proteinuria 02/18/2014    Renal insufficiency 03/26/2011   Valvular heart disease 04/28/2016    Past Surgical History:  Procedure Laterality Date   AV FISTULA PLACEMENT Left    x4   COLONOSCOPY  2014   cyst on left breast removed Left 1991   benign   ESOPHAGOGASTRODUODENOSCOPY (EGD) WITH ESOPHAGEAL DILATION  2014   LAPAROSCOPIC BILATERAL SALPINGO OOPHERECTOMY Bilateral 08/20/2020   Procedure: LAPAROSCOPIC BILATERAL SALPINGO OOPHORECTOMY;  Surgeon: Megan Salon, MD;  Location: [facility];  Service: Gynecology;  Laterality: Bilateral;   NASAL SEPTUM SURGERY  1986   rhinoplasty   polyps on bladder removed  1972   benign   RIGHT HEART CATH N/A 04/10/2021   Procedure: RIGHT HEART CATH;  Surgeon: Larey Dresser, MD;  Location: [facility];  Service: Cardiovascular;  Laterality: N/A;   TONSILLECTOMY  1962    Family History  Problem Relation Age of Onset   Breast cancer Mother 54       left breast removed, 2010 lung   Diabetes Mother    Nephrolithiasis Father    Heart attack Father 47       X 3   Heart disease Father        smoker   Hypertension Brother    Allergic rhinitis Brother    Melanoma Son 64       melanoma on leg removed   Pancreatic cancer Maternal Grandmother    Hypertension Paternal Grandmother  Obesity Paternal Grandmother    Heart attack Paternal Grandfather 25   Diabetes Maternal Aunt        x 2   Diabetes Maternal Uncle        x 3   Asthma Neg Hx    Eczema Neg Hx    Urticaria Neg Hx    Immunodeficiency Neg Hx    Angioedema Neg Hx     Social History   Socioeconomic History   Marital status: Married    Spouse name: Not on file   Number of children: 2   Years of education: Not on file   Highest education level: Not on file  Occupational History   Occupation: retired  Tobacco Use   Smoking status: Never   Smokeless tobacco: Never  Vaping Use   Vaping Use: Never used  Substance and Sexual Activity   Alcohol use: No   Drug use: No   Sexual activity: Yes     Partners: Male  Other Topics Concern   Not on file  Social History Narrative   Married and lives with husband.     Retired   1 son and 1 daughter   No alcohol tobacco or caffeine   Social Determinants of Health   Financial Resource Strain: Low Risk  (01/30/2021)   Overall Financial Resource Strain (CARDIA)    Difficulty of Paying Living Expenses: Not hard at all  Food Insecurity: No Food Insecurity (05/05/2022)   Hunger Vital Sign    Worried About Running Out of Food in the Last Year: Never true    Ran Out of Food in the Last Year: Never true  Transportation Needs: No Transportation Needs (05/05/2022)   PRAPARE - Hydrologist (Medical): No    Lack of Transportation (Non-Medical): No  Physical Activity: Insufficiently Active (01/30/2021)   Exercise Vital Sign    Days of Exercise per Week: 7 days    Minutes of Exercise per Session: 20 min  Stress: No Stress Concern Present (01/30/2021)   [place]    Feeling of Stress : Not at all  Social Connections: Moderately Integrated (01/30/2021)   Social Connection and Isolation Panel [NHANES]    Frequency of Communication with Friends and Family: More than three times a week    Frequency of Social Gatherings with Friends and Family: More than three times a week    Attends Religious Services: More than 4 times per year    Active Member of [employer] or Organizations: No    Attends Archivist Meetings: Never    Marital Status: Married  Human resources officer Violence: Not At Risk (05/05/2022)   Humiliation, Afraid, Rape, and Kick questionnaire    Fear of Current or Ex-Partner: No    Emotionally Abused: No    Physically Abused: No    Sexually Abused: No    Outpatient Medications Prior to Visit  Medication Sig Dispense Refill   acetaminophen (TYLENOL) 500 MG tablet Take 500 mg by mouth every 6 (six) hours as needed for headache (pain).     Alfalfa  500 MG TABS Take 500 mg by mouth 3 (three) times daily.     atorvastatin (LIPITOR) 20 MG tablet Take 20 mg by mouth at bedtime.     carvedilol (COREG) 25 MG tablet Take 1 tablet by mouth twice daily 180 tablet 0   cinacalcet (SENSIPAR) 30 MG tablet Take 30 mg by mouth at bedtime.  diltiazem (CARDIZEM CD) 240 MG 24 hr capsule Take 240 mg by mouth daily with lunch.     Flaxseed, Linseed, (FLAX SEED OIL) 1000 MG CAPS Take 1,000 mg by mouth every evening.     hydrALAZINE (APRESOLINE) 50 MG tablet Take 75 mg by mouth 2 (two) times daily.     L-Glutamine 500 MG CAPS Take 500 mg by mouth every evening.     lidocaine 4 % Place 1 patch onto the skin daily. 10 patch 1   lidocaine-prilocaine (EMLA) cream Apply 1 application. topically every Monday, Wednesday, and Friday with hemodialysis.     OVER THE COUNTER MEDICATION Take 3 capsules by mouth every evening. Shaklee Herb-Lax Natural Laxative for Colon Cleanse (Senna, Licorice, and Alfalfa)     Probiotic Product (PROBIOTIC DAILY PO) Take 1 capsule by mouth in the morning.     sevelamer carbonate (RENVELA) 800 MG tablet Take 2,400 mg by mouth See admin instructions. Take 3 tablets (2400 mg) by mouth with each meal & take 2 tablets (1600 mg) by mouth with each large snack     vitamin C (ASCORBIC ACID) 500 MG tablet Take 500 mg by mouth daily.     Vitamin E 400 units TABS Take 400 Units by mouth daily with lunch.     warfarin (COUMADIN) 1 MG tablet Take 1 mg by mouth every Tuesday, Thursday, and Saturday at 6 PM.     warfarin (COUMADIN) 5 MG tablet Take 1 tablet (5 mg total) by mouth daily at 4 PM. ONCE DAILY AS DIRECTED (Patient taking differently: Take 5 mg by mouth every evening.) 30 tablet 0   cyclobenzaprine (FLEXERIL) 5 MG tablet Take 1 tablet (5 mg total) by mouth 2 (two) times daily as needed for muscle spasms. 30 tablet 1   Facility-Administered Medications Prior to Visit  Medication Dose Route Frequency Provider Last Rate Last Admin    triamcinolone acetonide (KENALOG-40) injection 40 mg  40 mg Intra-articular Once Rosemarie Ax, MD        Allergies  Allergen Reactions   Sulfa Antibiotics Nausea And Vomiting   Clonidine Derivatives Palpitations    Review of Systems  Constitutional:  Negative for chills and fever.  Respiratory:  Negative for shortness of breath.   Cardiovascular:  Negative for chest pain and palpitations.  Gastrointestinal:  Negative for abdominal pain, blood in stool, constipation, diarrhea, nausea and vomiting.  Genitourinary:  Negative for dysuria, frequency, hematuria and urgency.  Musculoskeletal:  Positive for back pain (upper back pain in the lower neck and between the shoulder blades).  Skin:           Neurological:  Negative for headaches.       Objective:    Physical Exam Constitutional:      General: She is not in acute distress.    Appearance: Normal appearance. She is normal weight. She is not ill-appearing.  HENT:     Head: Normocephalic and atraumatic.     Right Ear: External ear normal.     Left Ear: External ear normal.     Nose: Nose normal.     Mouth/Throat:     Mouth: Mucous membranes are moist.     Pharynx: Oropharynx is clear.  Eyes:     General:        Right eye: No discharge.        Left eye: No discharge.     Extraocular Movements: Extraocular movements intact.     Conjunctiva/sclera: Conjunctivae normal.  Pupils: Pupils are equal, round, and reactive to light.  Neck:     Comments: There is tightness across the lower neck. Cardiovascular:     Rate and Rhythm: Normal rate and regular rhythm.     Pulses: Normal pulses.     Heart sounds: Normal heart sounds. No murmur heard.    No gallop.     Comments: Heart rate is mostly regular with occasional sporadic beat. Pulmonary:     Effort: Pulmonary effort is normal. No respiratory distress.     Breath sounds: Normal breath sounds. No wheezing or rales.  Abdominal:     General: Bowel sounds are normal.      Palpations: Abdomen is soft.     Tenderness: There is no abdominal tenderness. There is no guarding.  Musculoskeletal:        General: Normal range of motion.     Cervical back: Normal range of motion.     Right lower leg: No edema.     Left lower leg: No edema.  Skin:    General: Skin is warm and dry.  Neurological:     Mental Status: She is alert and oriented to person, place, and time.  Psychiatric:        Mood and Affect: Mood normal.        Behavior: Behavior normal.        Judgment: Judgment normal.    BP (!) 142/64 (BP Location: Right Arm, Patient Position: Sitting, Cuff Size: Normal)   Pulse 77   Temp 98.1 F (36.7 C) (Oral)   Resp 16   Ht 5\' 6"  (1.676 m)   Wt 136 lb 6.4 oz (61.9 kg)   SpO2 96%   BMI 22.02 kg/m  Wt Readings from Last 3 Encounters:  05/05/22 136 lb (61.7 kg)  05/05/22 136 lb 6.4 oz (61.9 kg)  04/23/22 134 lb 6.4 oz (61 kg)   Diabetic Foot Exam - Simple   No data filed    Lab Results  Component Value Date   WBC 5.1 12/04/2021   HGB 11.5 (L) 12/04/2021   HCT 35.9 (L) 12/04/2021   PLT 219.0 12/04/2021   GLUCOSE 90 12/04/2021   CHOL 157 12/04/2021   TRIG 59.0 12/04/2021   HDL 100.70 12/04/2021   LDLCALC 45 12/04/2021   ALT 20 12/04/2021   AST 26 12/04/2021   NA 138 12/04/2021   K 4.2 12/04/2021   CL 98 12/04/2021   CREATININE 5.34 (HH) 12/04/2021   BUN 43 (H) 12/04/2021   CO2 28 12/04/2021   TSH 0.07 (L) 12/04/2021   INR 1.1 04/10/2021   HGBA1C 5.7 (H) 10/25/2017    Lab Results  Component Value Date   TSH 0.07 (L) 12/04/2021   Lab Results  Component Value Date   WBC 5.1 12/04/2021   HGB 11.5 (L) 12/04/2021   HCT 35.9 (L) 12/04/2021   MCV 97.9 12/04/2021   PLT 219.0 12/04/2021   Lab Results  Component Value Date   NA 138 12/04/2021   K 4.2 12/04/2021   CHLORIDE 109 03/25/2016   CO2 28 12/04/2021   GLUCOSE 90 12/04/2021   BUN 43 (H) 12/04/2021   CREATININE 5.34 (HH) 12/04/2021   BILITOT 0.4 12/04/2021   ALKPHOS 75  12/04/2021   AST 26 12/04/2021   ALT 20 12/04/2021   PROT 6.2 12/04/2021   ALBUMIN 4.2 12/04/2021   CALCIUM 9.5 12/04/2021   ANIONGAP 11 08/15/2020   EGFR 8 (L) 04/01/2021   GFR 7.35 (LL) 12/04/2021  Lab Results  Component Value Date   CHOL 157 12/04/2021   Lab Results  Component Value Date   HDL 100.70 12/04/2021   Lab Results  Component Value Date   LDLCALC 45 12/04/2021   Lab Results  Component Value Date   TRIG 59.0 12/04/2021   Lab Results  Component Value Date   CHOLHDL 2 12/04/2021   Lab Results  Component Value Date   HGBA1C 5.7 (H) 10/25/2017      Assessment & Plan:  Labs: Routine blood work ordered.  Upper Back Pain: X-ray of the neck ordered. Cyclobenzaprine dosage increased: take 1-2 tablets (5-10 mg total) by mouth 2 times daily as needed. Problem List Items Addressed This Visit     Anemia in ESRD (end-stage renal disease)    Increase leafy greens, consider increased lean red meat and using cast iron cookware. Continue to monitor, report any concerns following with nephrology      Relevant Orders   CBC with Differential/Platelet   Atrial fibrillation    Rate controlled, tolerating meds      Disorder of vitamin B12    Continue to monitor      Relevant Orders   Vitamin B12   ESRD (end stage renal disease) - Primary    Works with nephrology in [place]      Relevant Orders   Comprehensive metabolic panel   HTN (hypertension)    Well controlled, no changes to meds. Encouraged heart healthy diet such as the DASH diet and exercise as tolerated.       Relevant Orders   Lipid panel   TSH   Hyperglycemia    hgba1c acceptable, minimize simple carbs. Increase exercise as tolerated.       Relevant Orders   Hemoglobin A1c   Hyperlipidemia    Encourage heart healthy diet such as MIND or DASH diet, increase exercise, avoid trans fats, simple carbohydrates and processed foods, consider a krill or fish or flaxseed oil cap daily.       Neck pain     Patient with pain for several months starting to interfere with dialysis and sleep. Cyclobenzaprine at 5 mg has not helped will increase to 5-10 mg po bid prn. Encouraged moist heat and gentle stretching as tolerated. May try NSAIDs and prescription meds as directed and report if symptoms worsen or seek immediate care       Relevant Orders   DG Cervical Spine Complete   Upper back pain    Encouraged moist heat and gentle stretching as tolerated. May try NSAIDs and prescription meds as directed and report if symptoms worsen or seek immediate care       Relevant Medications   cyclobenzaprine (FLEXERIL) 5 MG tablet   Meds ordered this encounter  Medications   cyclobenzaprine (FLEXERIL) 5 MG tablet    Sig: Take 1-2 tablets (5-10 mg total) by mouth 2 (two) times daily as needed for muscle spasms.    Dispense:  30 tablet    Refill:  1   I, [name], MD, personally preformed the services described in this documentation.  All medical record entries made by the scribe were at my direction and in my presence.  I have reviewed the chart and discharge instructions (if applicable) and agree that the record reflects my personal performance and is accurate and complete. 05/05/2022  I,Mohammed Iqbal,acting as a scribe for [name], MD.,have documented all relevant documentation on the behalf of [name], MD,as directed by  [name], MD while  in the presence of [name], MD.  [name], MD

## 2022-05-05 NOTE — Progress Notes (Signed)
Subjective:   Jillian Hayes is a 77 y.o. female who presents for Medicare Annual (Subsequent) preventive examination.  I connected with  ANDRE LICHT on 05/05/22 by a audio enabled telemedicine application and verified that I am speaking with the correct person using two identifiers.  Patient Location: Home  Provider Location: Office/Clinic  I discussed the limitations of evaluation and management by telemedicine. The patient expressed understanding and agreed to proceed.   Review of Systems     Cardiac Risk Factors include: advanced age (>61men, >37 women);dyslipidemia;hypertension     Objective:    Today's Vitals   05/05/22 1539  Weight: 136 lb (61.7 kg)  Height: 5\' 6"  (1.676 m)   Body mass index is 21.95 kg/m.     05/05/2022    3:37 PM 03/05/2022    2:01 PM 04/10/2021    6:06 AM 01/30/2021    1:08 PM 08/15/2020    2:37 PM 12/03/2019    4:08 AM 10/20/2019    7:06 PM  Advanced Directives  Does Patient Have a Medical Advance Directive? Yes Yes Yes Yes Yes No Yes  Type of Paramedic of [place];Living will [place];Living will Living will [place];Living will [place];Living will  [place];Living will  Does patient want to make changes to medical advance directive? No - Patient declined No - Patient declined No - Patient declined  No - Patient declined    Copy of [place] in Chart? No - copy requested Yes - validated most recent copy scanned in chart (See row information)  Yes - validated most recent copy scanned in chart (See row information) Yes - validated most recent copy scanned in chart (See row information)    Would patient like information on creating a medical advance directive?      Yes (ED - Information included in AVS)     Current Medications (verified) Outpatient Encounter Medications as of 05/05/2022  Medication Sig   acetaminophen  (TYLENOL) 500 MG tablet Take 500 mg by mouth every 6 (six) hours as needed for headache (pain).   Alfalfa 500 MG TABS Take 500 mg by mouth 3 (three) times daily.   atorvastatin (LIPITOR) 20 MG tablet Take 20 mg by mouth at bedtime.   carvedilol (COREG) 25 MG tablet Take 1 tablet by mouth twice daily   cinacalcet (SENSIPAR) 30 MG tablet Take 30 mg by mouth at bedtime.   cyclobenzaprine (FLEXERIL) 5 MG tablet Take 1-2 tablets (5-10 mg total) by mouth 2 (two) times daily as needed for muscle spasms.   diltiazem (CARDIZEM CD) 240 MG 24 hr capsule Take 240 mg by mouth daily with lunch.   Flaxseed, Linseed, (FLAX SEED OIL) 1000 MG CAPS Take 1,000 mg by mouth every evening.   hydrALAZINE (APRESOLINE) 50 MG tablet Take 75 mg by mouth 2 (two) times daily.   L-Glutamine 500 MG CAPS Take 500 mg by mouth every evening.   lidocaine 4 % Place 1 patch onto the skin daily.   lidocaine-prilocaine (EMLA) cream Apply 1 application. topically every Monday, Wednesday, and Friday with hemodialysis.   OVER THE COUNTER MEDICATION Take 3 capsules by mouth every evening. [place] Herb-Lax Natural Laxative for Colon Cleanse (Senna, Licorice, and Alfalfa)   Probiotic Product (PROBIOTIC DAILY PO) Take 1 capsule by mouth in the morning.   sevelamer carbonate (RENVELA) 800 MG tablet Take 2,400 mg by mouth See admin instructions. Take 3 tablets (2400  mg) by mouth with each meal & take 2 tablets (1600 mg) by mouth with each large snack   vitamin C (ASCORBIC ACID) 500 MG tablet Take 500 mg by mouth daily.   Vitamin E 400 units TABS Take 400 Units by mouth daily with lunch.   warfarin (COUMADIN) 1 MG tablet Take 1 mg by mouth every Tuesday, Thursday, and Saturday at 6 PM.   warfarin (COUMADIN) 5 MG tablet Take 1 tablet (5 mg total) by mouth daily at 4 PM. ONCE DAILY AS DIRECTED (Patient taking differently: Take 5 mg by mouth every evening.)   [DISCONTINUED] cyclobenzaprine (FLEXERIL) 5 MG tablet Take 1 tablet (5 mg total) by mouth 2  (two) times daily as needed for muscle spasms.   Facility-Administered Encounter Medications as of 05/05/2022  Medication   triamcinolone acetonide (KENALOG-40) injection 40 mg    Allergies (verified) Sulfa antibiotics and Clonidine derivatives   History: Past Medical History:  Diagnosis Date   Advanced care planning/counseling discussion 06/10/2014   05/31/2014 patient presents copy of HCP and Living Will   Anemia    iron deficiency   Anxiety    Arthritis    "in hands"   Atrial fibrillation    Benign fundic gland polyps of stomach    Bladder polyps 06/25/2010   Chronic headaches [id number]   Complication of anesthesia    "woke up at the end of a cyst removal surgery in 1991"   Depression 1991   hospitalized   Diverticulosis    ESRD (end stage renal disease) 11/09/2015   HD on MWF   External prolapsed hemorrhoids    History of chicken pox 06/25/2010   Hypercalcemia 02/18/2014   Hypertension    Insomnia 06/24/2010   Multiple chemical sensitivity syndrome 06/25/2010   Proteinuria 02/18/2014   Renal insufficiency 03/26/2011   Valvular heart disease 04/28/2016   Past Surgical History:  Procedure Laterality Date   AV FISTULA PLACEMENT Left    x4   COLONOSCOPY  2014   cyst on left breast removed Left 1991   benign   ESOPHAGOGASTRODUODENOSCOPY (EGD) WITH ESOPHAGEAL DILATION  2014   LAPAROSCOPIC BILATERAL SALPINGO OOPHERECTOMY Bilateral 08/20/2020   Procedure: LAPAROSCOPIC BILATERAL SALPINGO OOPHORECTOMY;  Surgeon: Megan Salon, MD;  Location: [facility];  Service: Gynecology;  Laterality: Bilateral;   NASAL SEPTUM SURGERY  1986   rhinoplasty   polyps on bladder removed  1972   benign   RIGHT HEART CATH N/A 04/10/2021   Procedure: RIGHT HEART CATH;  Surgeon: Larey Dresser, MD;  Location: [facility];  Service: Cardiovascular;  Laterality: N/A;   TONSILLECTOMY  1962   Family History  Problem Relation Age of Onset   Breast cancer Mother 56       left breast  removed, 2010 lung   Diabetes Mother    Nephrolithiasis Father    Heart attack Father 72       X 3   Heart disease Father        smoker   Hypertension Brother    Allergic rhinitis Brother    Melanoma Son 67       melanoma on leg removed   Pancreatic cancer Maternal Grandmother    Hypertension Paternal Grandmother    Obesity Paternal Grandmother    Heart attack Paternal Grandfather 79   Diabetes Maternal Aunt        x 2   Diabetes Maternal Uncle        x 3   Asthma Neg Hx  Eczema Neg Hx    Urticaria Neg Hx    Immunodeficiency Neg Hx    Angioedema Neg Hx    Social History   Socioeconomic History   Marital status: Married    Spouse name: Not on file   Number of children: 2   Years of education: Not on file   Highest education level: Not on file  Occupational History   Occupation: retired  Tobacco Use   Smoking status: Never   Smokeless tobacco: Never  Vaping Use   Vaping Use: Never used  Substance and Sexual Activity   Alcohol use: No   Drug use: No   Sexual activity: Yes    Partners: Male  Other Topics Concern   Not on file  Social History Narrative   Married and lives with husband.     Retired   1 son and 1 daughter   No alcohol tobacco or caffeine   Social Determinants of Health   Financial Resource Strain: Low Risk  (01/30/2021)   Overall Financial Resource Strain (CARDIA)    Difficulty of Paying Living Expenses: Not hard at all  Food Insecurity: No Food Insecurity (05/05/2022)   Hunger Vital Sign    Worried About Running Out of Food in the Last Year: Never true    Ran Out of Food in the Last Year: Never true  Transportation Needs: No Transportation Needs (05/05/2022)   PRAPARE - Hydrologist (Medical): No    Lack of Transportation (Non-Medical): No  Physical Activity: Insufficiently Active (01/30/2021)   Exercise Vital Sign    Days of Exercise per Week: 7 days    Minutes of Exercise per Session: 20 min  Stress: No  Stress Concern Present (01/30/2021)   [place]    Feeling of Stress : Not at all  Social Connections: Moderately Integrated (01/30/2021)   Social Connection and Isolation Panel [NHANES]    Frequency of Communication with Friends and Family: More than three times a week    Frequency of Social Gatherings with Friends and Family: More than three times a week    Attends Religious Services: More than 4 times per year    Active Member of [employer] or Organizations: No    Attends Music therapist: Never    Marital Status: Married    Tobacco Counseling Counseling given: Not Answered   Clinical Intake:  Pre-visit preparation completed: Yes  Pain : 0-10 Pain Location: Neck Pain Descriptors / Indicators: Aching, Stabbing Pain Onset: More than a month ago Pain Frequency: Constant   BMI - recorded: 22.02 Nutritional Status: BMI of 19-24  Normal Nutritional Risks: None Diabetes: No  How often do you need to have someone help you when you read instructions, pamphlets, or other written materials from your doctor or pharmacy?: 1 - Never  Activities of Daily Living    05/05/2022    3:42 PM  In your present state of health, do you have any difficulty performing the following activities:  Hearing? 1  Comment wears hearing aids  Vision? 0  Difficulty concentrating or making decisions? 0  Walking or climbing stairs? 0  Comment just takes her time going up and down  Dressing or bathing? 0  Doing errands, shopping? 0  Preparing Food and eating ? N  Using the Toilet? N  In the past six months, have you accidently leaked urine? Y  Comment some stress incontinence  Do you have problems with  loss of bowel control? N  Managing your Medications? N  Managing your Finances? N  Housekeeping or managing your Housekeeping? N    Patient Care Team: [name], MD as PCP - General (Family Medicine) Minus Breeding,  MD as PCP - Cardiology (Cardiology) Minus Breeding, MD as Consulting Physician (Cardiology)  Indicate any recent Medical Services you may have received from other than [facility] providers in the past year (date may be approximate).     Assessment:   This is a routine wellness examination for [place].  Hearing/Vision screen No results found.  Dietary issues and exercise activities discussed: Current Exercise Habits: The patient does not participate in regular exercise at present, Exercise limited by: orthopedic condition(s)   Goals Addressed   None    Depression Screen    05/05/2022    2:04 PM 12/04/2021    1:21 PM 01/30/2021    1:11 PM 10/03/2020    3:55 PM 07/23/2020   11:19 AM 12/07/2019    1:19 PM 12/20/2017    3:18 PM  PHQ 2/9 Scores  PHQ - 2 Score 0 0 0 0 0 0 0  PHQ- 9 Score 0 6         Fall Risk    05/05/2022    2:04 PM 12/04/2021    1:21 PM 01/30/2021    1:09 PM 07/23/2020   11:19 AM 08/15/2019   11:38 AM  Fall Risk   Falls in the past year? 0 0 0 0 0  Number falls in past yr: 0 0 0 0   Injury with Fall? 0 0 0 0   Follow up Falls evaluation completed Falls evaluation completed Falls prevention discussed      FALL RISK PREVENTION PERTAINING TO THE HOME:  Any stairs in or around the home? Yes  If so, are there any without handrails? No  Home free of loose throw rugs in walkways, pet beds, electrical cords, etc? Yes  Adequate lighting in your home to reduce risk of falls? Yes   ASSISTIVE DEVICES UTILIZED TO PREVENT FALLS:  Life alert? No  Use of a cane, walker or w/c? Yes  Grab bars in the bathroom? No  Shower chair or bench in shower? Yes  Elevated toilet seat or a handicapped toilet?  Comfort height  TIMED UP AND GO:  Was the test performed?  No, audio visit .    Cognitive Function:    12/17/2016    3:18 PM  MMSE - Mini Mental State Exam  Orientation to time 5  Orientation to Place 5  Registration 3  Attention/ Calculation 5  Recall 3  Language- name  2 objects 2  Language- repeat 1  Language- follow 3 step command 3  Language- read & follow direction 1  Write a sentence 1  Copy design 1  Total score 30        05/05/2022    3:45 PM  6CIT Screen  What Year? 0 points  What month? 0 points  What time? 0 points  Count back from 20 0 points  Months in reverse 0 points  Repeat phrase 0 points  Total Score 0 points    Immunizations Immunization History  Administered Date(s) Administered   Hepatitis A 02/20/2019   Hepatitis A, Ped/Adol-2 Dose 02/20/2019   Hepatitis B 02/20/2019, 06/19/2019, 08/02/2019   Influenza, High Dose Seasonal PF 02/24/2018   Influenza-Unspecified 01/13/2019, 12/01/2019, 11/04/2020   Moderna Sars-Covid-2 Vaccination 03/29/2019, 04/26/2019, 10/06/2019   Pneumococcal Conjugate-13 01/20/2019   Pneumococcal  Polysaccharide-23 06/07/2019   Tdap 06/10/2012   Zoster, Live 01/03/2012    TDAP status: Up to date  Flu Vaccine status: Up to date  Pneumococcal vaccine status: Up to date  Covid-19 vaccine status: Information provided on how to obtain vaccines.   Qualifies for Shingles Vaccine? Yes   Zostavax completed Yes   Shingrix Completed?: No.    Education has been provided regarding the importance of this vaccine. Patient has been advised to call insurance company to determine out of pocket expense if they have not yet received this vaccine. Advised may also receive vaccine at local pharmacy or Health Dept. Verbalized acceptance and understanding.  Screening Tests Health Maintenance  Topic Date Due   Zoster Vaccines- Shingrix (1 of 2) Never done   Medicare Annual Wellness (AWV)  01/30/2022   DEXA SCAN  03/19/2022   COVID-19 Vaccine (4 - 2023-24 season) 08/28/2022 (Originally 10/03/2021)   DTaP/Tdap/Td (2 - Td or Tdap) 06/11/2022   INFLUENZA VACCINE  09/03/2022   Pneumonia Vaccine 39+ Years old  Completed   Hepatitis C Screening  Completed   HPV VACCINES  Aged Out   COLONOSCOPY (Pts 45-12yrs Insurance  coverage will need to be confirmed)  Discontinued    Health Maintenance  Health Maintenance Due  Topic Date Due   Zoster Vaccines- Shingrix (1 of 2) Never done   Medicare Annual Wellness (AWV)  01/30/2022   DEXA SCAN  03/19/2022    Colorectal cancer screening: No longer required.   Mammogram status: Completed 02/19/22. Repeat every year  Bone Density status: Completed 03/19/20. Results reflect: Bone density results: OSTEOPOROSIS. Repeat every 2 years.  Lung Cancer Screening: (Low Dose CT Chest recommended if Age 6-80 years, 30 pack-year currently smoking OR have quit w/in 15years.) does not qualify.   Additional Screening:  Hepatitis C Screening: does qualify; Completed 09/13/15  Vision Screening: Recommended annual ophthalmology exams for early detection of glaucoma and other disorders of the eye. Is the patient up to date with their annual eye exam?  Yes  Who is the provider or what is the name of the office in which the patient attends annual eye exams? Dr. Marica Otter If pt is not established with a provider, would they like to be referred to a provider to establish care? No .   Dental Screening: Recommended annual dental exams for proper oral hygiene  Community Resource Referral / Chronic Care Management: CRR required this visit?  No   CCM required this visit?  No      Plan:     I have personally reviewed and noted the following in the patient's chart:   Medical and social history Use of alcohol, tobacco or illicit drugs  Current medications and supplements including opioid prescriptions. Patient is not currently taking opioid prescriptions. Functional ability and status Nutritional status Physical activity Advanced directives List of other physicians Hospitalizations, surgeries, and ER visits in previous 12 months Vitals Screenings to include cognitive, depression, and falls Referrals and appointments  In addition, I have reviewed and discussed with  patient certain preventive protocols, quality metrics, and best practice recommendations. A written personalized care plan for preventive services as well as general preventive health recommendations were provided to patient.   Due to this being a telephonic visit, the after visit summary with patients personalized plan was offered to patient via mail or my-chart. Patient would like to access on my-chart.  Beatris Ship, [place]   05/05/2022   Nurse Notes: None

## 2022-05-05 NOTE — Assessment & Plan Note (Signed)
Patient with pain for several months starting to interfere with dialysis and sleep. Cyclobenzaprine at 5 mg has not helped will increase to 5-10 mg po bid prn. Encouraged moist heat and gentle stretching as tolerated. May try NSAIDs and prescription meds as directed and report if symptoms worsen or seek immediate care

## 2022-05-06 DIAGNOSIS — D631 Anemia in chronic kidney disease: Secondary | ICD-10-CM | POA: Diagnosis not present

## 2022-05-06 DIAGNOSIS — D509 Iron deficiency anemia, unspecified: Secondary | ICD-10-CM | POA: Diagnosis not present

## 2022-05-06 DIAGNOSIS — N2581 Secondary hyperparathyroidism of renal origin: Secondary | ICD-10-CM | POA: Diagnosis not present

## 2022-05-06 DIAGNOSIS — N186 End stage renal disease: Secondary | ICD-10-CM | POA: Diagnosis not present

## 2022-05-08 ENCOUNTER — Encounter: Payer: Self-pay | Admitting: Family Medicine

## 2022-05-08 DIAGNOSIS — D509 Iron deficiency anemia, unspecified: Secondary | ICD-10-CM | POA: Diagnosis not present

## 2022-05-08 DIAGNOSIS — N186 End stage renal disease: Secondary | ICD-10-CM | POA: Diagnosis not present

## 2022-05-08 DIAGNOSIS — N2581 Secondary hyperparathyroidism of renal origin: Secondary | ICD-10-CM | POA: Diagnosis not present

## 2022-05-08 DIAGNOSIS — D631 Anemia in chronic kidney disease: Secondary | ICD-10-CM | POA: Diagnosis not present

## 2022-05-11 DIAGNOSIS — N2581 Secondary hyperparathyroidism of renal origin: Secondary | ICD-10-CM | POA: Diagnosis not present

## 2022-05-11 DIAGNOSIS — N186 End stage renal disease: Secondary | ICD-10-CM | POA: Diagnosis not present

## 2022-05-11 DIAGNOSIS — D509 Iron deficiency anemia, unspecified: Secondary | ICD-10-CM | POA: Diagnosis not present

## 2022-05-11 DIAGNOSIS — D631 Anemia in chronic kidney disease: Secondary | ICD-10-CM | POA: Diagnosis not present

## 2022-05-13 DIAGNOSIS — N186 End stage renal disease: Secondary | ICD-10-CM | POA: Diagnosis not present

## 2022-05-13 DIAGNOSIS — D631 Anemia in chronic kidney disease: Secondary | ICD-10-CM | POA: Diagnosis not present

## 2022-05-13 DIAGNOSIS — N2581 Secondary hyperparathyroidism of renal origin: Secondary | ICD-10-CM | POA: Diagnosis not present

## 2022-05-13 DIAGNOSIS — D509 Iron deficiency anemia, unspecified: Secondary | ICD-10-CM | POA: Diagnosis not present

## 2022-05-16 DIAGNOSIS — D509 Iron deficiency anemia, unspecified: Secondary | ICD-10-CM | POA: Diagnosis not present

## 2022-05-16 DIAGNOSIS — D631 Anemia in chronic kidney disease: Secondary | ICD-10-CM | POA: Diagnosis not present

## 2022-05-16 DIAGNOSIS — N2581 Secondary hyperparathyroidism of renal origin: Secondary | ICD-10-CM | POA: Diagnosis not present

## 2022-05-16 DIAGNOSIS — N186 End stage renal disease: Secondary | ICD-10-CM | POA: Diagnosis not present

## 2022-05-18 ENCOUNTER — Encounter: Payer: Self-pay | Admitting: *Deleted

## 2022-05-18 DIAGNOSIS — D509 Iron deficiency anemia, unspecified: Secondary | ICD-10-CM | POA: Diagnosis not present

## 2022-05-18 DIAGNOSIS — N186 End stage renal disease: Secondary | ICD-10-CM | POA: Diagnosis not present

## 2022-05-18 DIAGNOSIS — N2581 Secondary hyperparathyroidism of renal origin: Secondary | ICD-10-CM | POA: Diagnosis not present

## 2022-05-18 DIAGNOSIS — D631 Anemia in chronic kidney disease: Secondary | ICD-10-CM | POA: Diagnosis not present

## 2022-05-18 DIAGNOSIS — Z7901 Long term (current) use of anticoagulants: Secondary | ICD-10-CM | POA: Diagnosis not present

## 2022-05-19 ENCOUNTER — Other Ambulatory Visit: Payer: Self-pay | Admitting: Cardiology

## 2022-05-19 DIAGNOSIS — H6992 Unspecified Eustachian tube disorder, left ear: Secondary | ICD-10-CM | POA: Diagnosis not present

## 2022-05-19 DIAGNOSIS — H7412 Adhesive left middle ear disease: Secondary | ICD-10-CM | POA: Diagnosis not present

## 2022-05-20 DIAGNOSIS — N186 End stage renal disease: Secondary | ICD-10-CM | POA: Diagnosis not present

## 2022-05-20 DIAGNOSIS — N2581 Secondary hyperparathyroidism of renal origin: Secondary | ICD-10-CM | POA: Diagnosis not present

## 2022-05-20 DIAGNOSIS — D631 Anemia in chronic kidney disease: Secondary | ICD-10-CM | POA: Diagnosis not present

## 2022-05-20 DIAGNOSIS — D509 Iron deficiency anemia, unspecified: Secondary | ICD-10-CM | POA: Diagnosis not present

## 2022-05-21 DIAGNOSIS — Z77122 Contact with and (suspected) exposure to noise: Secondary | ICD-10-CM | POA: Diagnosis not present

## 2022-05-21 DIAGNOSIS — H903 Sensorineural hearing loss, bilateral: Secondary | ICD-10-CM | POA: Diagnosis not present

## 2022-05-22 DIAGNOSIS — D509 Iron deficiency anemia, unspecified: Secondary | ICD-10-CM | POA: Diagnosis not present

## 2022-05-22 DIAGNOSIS — N2581 Secondary hyperparathyroidism of renal origin: Secondary | ICD-10-CM | POA: Diagnosis not present

## 2022-05-22 DIAGNOSIS — D631 Anemia in chronic kidney disease: Secondary | ICD-10-CM | POA: Diagnosis not present

## 2022-05-22 DIAGNOSIS — N186 End stage renal disease: Secondary | ICD-10-CM | POA: Diagnosis not present

## 2022-05-25 DIAGNOSIS — N2581 Secondary hyperparathyroidism of renal origin: Secondary | ICD-10-CM | POA: Diagnosis not present

## 2022-05-25 DIAGNOSIS — D631 Anemia in chronic kidney disease: Secondary | ICD-10-CM | POA: Diagnosis not present

## 2022-05-25 DIAGNOSIS — H7412 Adhesive left middle ear disease: Secondary | ICD-10-CM | POA: Diagnosis not present

## 2022-05-25 DIAGNOSIS — H6992 Unspecified Eustachian tube disorder, left ear: Secondary | ICD-10-CM | POA: Diagnosis not present

## 2022-05-25 DIAGNOSIS — N186 End stage renal disease: Secondary | ICD-10-CM | POA: Diagnosis not present

## 2022-05-25 DIAGNOSIS — Z7901 Long term (current) use of anticoagulants: Secondary | ICD-10-CM | POA: Diagnosis not present

## 2022-05-25 DIAGNOSIS — D509 Iron deficiency anemia, unspecified: Secondary | ICD-10-CM | POA: Diagnosis not present

## 2022-05-27 DIAGNOSIS — N2581 Secondary hyperparathyroidism of renal origin: Secondary | ICD-10-CM | POA: Diagnosis not present

## 2022-05-27 DIAGNOSIS — Z992 Dependence on renal dialysis: Secondary | ICD-10-CM | POA: Diagnosis not present

## 2022-05-27 DIAGNOSIS — I4891 Unspecified atrial fibrillation: Secondary | ICD-10-CM | POA: Diagnosis not present

## 2022-05-27 DIAGNOSIS — E785 Hyperlipidemia, unspecified: Secondary | ICD-10-CM | POA: Diagnosis not present

## 2022-05-27 DIAGNOSIS — T82590A Other mechanical complication of surgically created arteriovenous fistula, initial encounter: Secondary | ICD-10-CM | POA: Diagnosis not present

## 2022-05-27 DIAGNOSIS — N186 End stage renal disease: Secondary | ICD-10-CM | POA: Diagnosis not present

## 2022-05-27 DIAGNOSIS — I120 Hypertensive chronic kidney disease with stage 5 chronic kidney disease or end stage renal disease: Secondary | ICD-10-CM | POA: Diagnosis not present

## 2022-05-27 DIAGNOSIS — D509 Iron deficiency anemia, unspecified: Secondary | ICD-10-CM | POA: Diagnosis not present

## 2022-05-27 DIAGNOSIS — T82510A Breakdown (mechanical) of surgically created arteriovenous fistula, initial encounter: Secondary | ICD-10-CM | POA: Diagnosis not present

## 2022-05-27 DIAGNOSIS — Z7901 Long term (current) use of anticoagulants: Secondary | ICD-10-CM | POA: Diagnosis not present

## 2022-05-27 DIAGNOSIS — D631 Anemia in chronic kidney disease: Secondary | ICD-10-CM | POA: Diagnosis not present

## 2022-05-29 DIAGNOSIS — D509 Iron deficiency anemia, unspecified: Secondary | ICD-10-CM | POA: Diagnosis not present

## 2022-05-29 DIAGNOSIS — N2581 Secondary hyperparathyroidism of renal origin: Secondary | ICD-10-CM | POA: Diagnosis not present

## 2022-05-29 DIAGNOSIS — N186 End stage renal disease: Secondary | ICD-10-CM | POA: Diagnosis not present

## 2022-05-29 DIAGNOSIS — D631 Anemia in chronic kidney disease: Secondary | ICD-10-CM | POA: Diagnosis not present

## 2022-06-01 DIAGNOSIS — Z7901 Long term (current) use of anticoagulants: Secondary | ICD-10-CM | POA: Diagnosis not present

## 2022-06-01 DIAGNOSIS — D509 Iron deficiency anemia, unspecified: Secondary | ICD-10-CM | POA: Diagnosis not present

## 2022-06-01 DIAGNOSIS — N186 End stage renal disease: Secondary | ICD-10-CM | POA: Diagnosis not present

## 2022-06-01 DIAGNOSIS — N2581 Secondary hyperparathyroidism of renal origin: Secondary | ICD-10-CM | POA: Diagnosis not present

## 2022-06-01 DIAGNOSIS — D631 Anemia in chronic kidney disease: Secondary | ICD-10-CM | POA: Diagnosis not present

## 2022-06-02 DIAGNOSIS — H26492 Other secondary cataract, left eye: Secondary | ICD-10-CM | POA: Diagnosis not present

## 2022-06-02 DIAGNOSIS — H53022 Refractive amblyopia, left eye: Secondary | ICD-10-CM | POA: Diagnosis not present

## 2022-06-02 DIAGNOSIS — N186 End stage renal disease: Secondary | ICD-10-CM | POA: Diagnosis not present

## 2022-06-02 DIAGNOSIS — Z961 Presence of intraocular lens: Secondary | ICD-10-CM | POA: Diagnosis not present

## 2022-06-02 DIAGNOSIS — Z992 Dependence on renal dialysis: Secondary | ICD-10-CM | POA: Diagnosis not present

## 2022-06-02 DIAGNOSIS — H18413 Arcus senilis, bilateral: Secondary | ICD-10-CM | POA: Diagnosis not present

## 2022-06-03 ENCOUNTER — Encounter: Payer: Self-pay | Admitting: Family Medicine

## 2022-06-03 ENCOUNTER — Ambulatory Visit (INDEPENDENT_AMBULATORY_CARE_PROVIDER_SITE_OTHER): Payer: Medicare HMO | Admitting: Family Medicine

## 2022-06-03 VITALS — BP 143/58 | HR 72 | Ht 66.0 in | Wt 136.0 lb

## 2022-06-03 DIAGNOSIS — M546 Pain in thoracic spine: Secondary | ICD-10-CM | POA: Diagnosis not present

## 2022-06-03 NOTE — Progress Notes (Signed)
   Acute Office Visit  Subjective:     Patient ID: Jillian Hayes, female    DOB: 05/23/1945, 77 y.o.   MRN: [id number]  Chief Complaint  Patient presents with   Back Pain     Patient is in today for chronic pain.    Patient continues to struggle with mid/upper back pain bilaterally. States she complete all of the PT her insurance would pay for - she did get some temporary improvement at times, but no consistent improvement. Flexeril did not help at all. She is still doing the home exercises, lidocaine patches with minimal improvement. She reports the pain is variable, but last night it was up to 8/10 and kept her from sleeping. Xrays have revealed degenerative changes. Reports sitting at dialysis 3-4 hours at a time 3 days per week is terrible. She is ready to take the next step and see a specialist. She does not have any radiating pain, numbness, tingling, asymmetrical weakness.    All review of systems negative except what is listed in the HPI      Objective:    BP (!) 143/58   Pulse 72   Ht 5\' 6"  (1.676 m)   Wt 136 lb (61.7 kg)   SpO2 97%   BMI 21.95 kg/m    Physical Exam Vitals reviewed.  Constitutional:      General: She is not in acute distress.    Appearance: Normal appearance. She is not ill-appearing.  Musculoskeletal:        General: No swelling. Normal range of motion.     Comments: Mild scoliosis. Tenderness to palpation and muscle tension noted to bilateral paraspinal muscles of upper back  Neurological:     General: No focal deficit present.     Mental Status: She is alert and oriented to person, place, and time. Mental status is at baseline.  Psychiatric:        Mood and Affect: Mood normal.        Behavior: Behavior normal.        Thought Content: Thought content normal.        Judgment: Judgment normal.     No results found for any visits on 06/03/22.      Assessment & Plan:   Problem List Items Addressed This Visit   None Visit  Diagnoses     Bilateral thoracic back pain, unspecified chronicity    -  Primary Referral to ortho - consider advanced imaging and other treatment options Continue supportive measure for now Patient aware of signs/symptoms requiring further/urgent evaluation.     Relevant Orders   Ambulatory referral to Orthopedic Surgery       No orders of the defined types were placed in this encounter.   Return if symptoms worsen or fail to improve.  Clayborne Dana, NP

## 2022-06-04 DIAGNOSIS — N186 End stage renal disease: Secondary | ICD-10-CM | POA: Diagnosis not present

## 2022-06-04 DIAGNOSIS — D509 Iron deficiency anemia, unspecified: Secondary | ICD-10-CM | POA: Diagnosis not present

## 2022-06-04 DIAGNOSIS — D631 Anemia in chronic kidney disease: Secondary | ICD-10-CM | POA: Diagnosis not present

## 2022-06-04 DIAGNOSIS — N2581 Secondary hyperparathyroidism of renal origin: Secondary | ICD-10-CM | POA: Diagnosis not present

## 2022-06-05 DIAGNOSIS — I4891 Unspecified atrial fibrillation: Secondary | ICD-10-CM | POA: Diagnosis not present

## 2022-06-05 DIAGNOSIS — Z7901 Long term (current) use of anticoagulants: Secondary | ICD-10-CM | POA: Diagnosis not present

## 2022-06-05 DIAGNOSIS — T82590A Other mechanical complication of surgically created arteriovenous fistula, initial encounter: Secondary | ICD-10-CM | POA: Diagnosis not present

## 2022-06-05 DIAGNOSIS — Y832 Surgical operation with anastomosis, bypass or graft as the cause of abnormal reaction of the patient, or of later complication, without mention of misadventure at the time of the procedure: Secondary | ICD-10-CM | POA: Diagnosis not present

## 2022-06-05 DIAGNOSIS — N186 End stage renal disease: Secondary | ICD-10-CM | POA: Diagnosis not present

## 2022-06-05 DIAGNOSIS — Z992 Dependence on renal dialysis: Secondary | ICD-10-CM | POA: Diagnosis not present

## 2022-06-06 DIAGNOSIS — D631 Anemia in chronic kidney disease: Secondary | ICD-10-CM | POA: Diagnosis not present

## 2022-06-06 DIAGNOSIS — N2581 Secondary hyperparathyroidism of renal origin: Secondary | ICD-10-CM | POA: Diagnosis not present

## 2022-06-06 DIAGNOSIS — N186 End stage renal disease: Secondary | ICD-10-CM | POA: Diagnosis not present

## 2022-06-06 DIAGNOSIS — D509 Iron deficiency anemia, unspecified: Secondary | ICD-10-CM | POA: Diagnosis not present

## 2022-06-08 DIAGNOSIS — D631 Anemia in chronic kidney disease: Secondary | ICD-10-CM | POA: Diagnosis not present

## 2022-06-08 DIAGNOSIS — D509 Iron deficiency anemia, unspecified: Secondary | ICD-10-CM | POA: Diagnosis not present

## 2022-06-08 DIAGNOSIS — N2581 Secondary hyperparathyroidism of renal origin: Secondary | ICD-10-CM | POA: Diagnosis not present

## 2022-06-08 DIAGNOSIS — Z7901 Long term (current) use of anticoagulants: Secondary | ICD-10-CM | POA: Diagnosis not present

## 2022-06-08 DIAGNOSIS — N186 End stage renal disease: Secondary | ICD-10-CM | POA: Diagnosis not present

## 2022-06-09 DIAGNOSIS — H2512 Age-related nuclear cataract, left eye: Secondary | ICD-10-CM | POA: Diagnosis not present

## 2022-06-09 DIAGNOSIS — Z9842 Cataract extraction status, left eye: Secondary | ICD-10-CM | POA: Diagnosis not present

## 2022-06-09 DIAGNOSIS — Z961 Presence of intraocular lens: Secondary | ICD-10-CM | POA: Diagnosis not present

## 2022-06-09 DIAGNOSIS — Z9841 Cataract extraction status, right eye: Secondary | ICD-10-CM | POA: Diagnosis not present

## 2022-06-10 DIAGNOSIS — N2581 Secondary hyperparathyroidism of renal origin: Secondary | ICD-10-CM | POA: Diagnosis not present

## 2022-06-10 DIAGNOSIS — N186 End stage renal disease: Secondary | ICD-10-CM | POA: Diagnosis not present

## 2022-06-10 DIAGNOSIS — D509 Iron deficiency anemia, unspecified: Secondary | ICD-10-CM | POA: Diagnosis not present

## 2022-06-10 DIAGNOSIS — D631 Anemia in chronic kidney disease: Secondary | ICD-10-CM | POA: Diagnosis not present

## 2022-06-12 DIAGNOSIS — N2581 Secondary hyperparathyroidism of renal origin: Secondary | ICD-10-CM | POA: Diagnosis not present

## 2022-06-12 DIAGNOSIS — D509 Iron deficiency anemia, unspecified: Secondary | ICD-10-CM | POA: Diagnosis not present

## 2022-06-12 DIAGNOSIS — D631 Anemia in chronic kidney disease: Secondary | ICD-10-CM | POA: Diagnosis not present

## 2022-06-12 DIAGNOSIS — N186 End stage renal disease: Secondary | ICD-10-CM | POA: Diagnosis not present

## 2022-06-15 DIAGNOSIS — D631 Anemia in chronic kidney disease: Secondary | ICD-10-CM | POA: Diagnosis not present

## 2022-06-15 DIAGNOSIS — D509 Iron deficiency anemia, unspecified: Secondary | ICD-10-CM | POA: Diagnosis not present

## 2022-06-15 DIAGNOSIS — Z7901 Long term (current) use of anticoagulants: Secondary | ICD-10-CM | POA: Diagnosis not present

## 2022-06-15 DIAGNOSIS — N2581 Secondary hyperparathyroidism of renal origin: Secondary | ICD-10-CM | POA: Diagnosis not present

## 2022-06-15 DIAGNOSIS — N186 End stage renal disease: Secondary | ICD-10-CM | POA: Diagnosis not present

## 2022-06-16 ENCOUNTER — Other Ambulatory Visit: Payer: Self-pay

## 2022-06-16 ENCOUNTER — Emergency Department (HOSPITAL_BASED_OUTPATIENT_CLINIC_OR_DEPARTMENT_OTHER)
Admission: EM | Admit: 2022-06-16 | Discharge: 2022-06-16 | Disposition: A | Discharge status: Home / Self Care | Payer: Medicare HMO | Attending: Emergency Medicine | Admitting: Emergency Medicine

## 2022-06-16 DIAGNOSIS — Y832 Surgical operation with anastomosis, bypass or graft as the cause of abnormal reaction of the patient, or of later complication, without mention of misadventure at the time of the procedure: Secondary | ICD-10-CM | POA: Diagnosis not present

## 2022-06-16 DIAGNOSIS — I721 Aneurysm of artery of upper extremity: Secondary | ICD-10-CM | POA: Diagnosis not present

## 2022-06-16 DIAGNOSIS — T82848A Pain from vascular prosthetic devices, implants and grafts, initial encounter: Secondary | ICD-10-CM | POA: Diagnosis not present

## 2022-06-16 DIAGNOSIS — I482 Chronic atrial fibrillation, unspecified: Secondary | ICD-10-CM | POA: Diagnosis not present

## 2022-06-16 DIAGNOSIS — Z79899 Other long term (current) drug therapy: Secondary | ICD-10-CM | POA: Diagnosis not present

## 2022-06-16 DIAGNOSIS — Y828 Other medical devices associated with adverse incidents: Secondary | ICD-10-CM | POA: Diagnosis not present

## 2022-06-16 DIAGNOSIS — Z7902 Long term (current) use of antithrombotics/antiplatelets: Secondary | ICD-10-CM | POA: Diagnosis not present

## 2022-06-16 DIAGNOSIS — N186 End stage renal disease: Secondary | ICD-10-CM | POA: Insufficient documentation

## 2022-06-16 DIAGNOSIS — T82510A Breakdown (mechanical) of surgically created arteriovenous fistula, initial encounter: Secondary | ICD-10-CM | POA: Diagnosis not present

## 2022-06-16 DIAGNOSIS — D631 Anemia in chronic kidney disease: Secondary | ICD-10-CM | POA: Diagnosis not present

## 2022-06-16 DIAGNOSIS — Z4901 Encounter for fitting and adjustment of extracorporeal dialysis catheter: Secondary | ICD-10-CM | POA: Diagnosis not present

## 2022-06-16 DIAGNOSIS — R58 Hemorrhage, not elsewhere classified: Secondary | ICD-10-CM | POA: Diagnosis not present

## 2022-06-16 DIAGNOSIS — Z4801 Encounter for change or removal of surgical wound dressing: Secondary | ICD-10-CM | POA: Insufficient documentation

## 2022-06-16 DIAGNOSIS — G4700 Insomnia, unspecified: Secondary | ICD-10-CM | POA: Diagnosis not present

## 2022-06-16 DIAGNOSIS — I6523 Occlusion and stenosis of bilateral carotid arteries: Secondary | ICD-10-CM | POA: Diagnosis not present

## 2022-06-16 DIAGNOSIS — Z992 Dependence on renal dialysis: Secondary | ICD-10-CM | POA: Diagnosis not present

## 2022-06-16 DIAGNOSIS — M898X9 Other specified disorders of bone, unspecified site: Secondary | ICD-10-CM | POA: Diagnosis not present

## 2022-06-16 DIAGNOSIS — T82590A Other mechanical complication of surgically created arteriovenous fistula, initial encounter: Secondary | ICD-10-CM | POA: Diagnosis not present

## 2022-06-16 DIAGNOSIS — Y712 Prosthetic and other implants, materials and accessory cardiovascular devices associated with adverse incidents: Secondary | ICD-10-CM | POA: Diagnosis not present

## 2022-06-16 DIAGNOSIS — Z5189 Encounter for other specified aftercare: Secondary | ICD-10-CM

## 2022-06-16 DIAGNOSIS — Z7901 Long term (current) use of anticoagulants: Secondary | ICD-10-CM | POA: Diagnosis not present

## 2022-06-16 DIAGNOSIS — I120 Hypertensive chronic kidney disease with stage 5 chronic kidney disease or end stage renal disease: Secondary | ICD-10-CM | POA: Diagnosis not present

## 2022-06-16 NOTE — ED Notes (Signed)
History of hypertension. Patient has not taken her blood pressure medication that was due at lunch time.

## 2022-06-16 NOTE — ED Notes (Signed)
Pt A&Ox4 ambulatory at d/c with independent steady gait. Pt verbalized understanding of d/c instructions and follow up care. 

## 2022-06-16 NOTE — ED Triage Notes (Signed)
Patient presents to ED via POV from home. Here requesting dressing check to right chest. Patient had a perm-cath placed today. Reports it started bleeding today. No active bleeding at this time. Requesting dressing change.

## 2022-06-16 NOTE — ED Provider Notes (Signed)
[place] [facility] AT [facility] Provider Note   CSN: [id number] Arrival date & time: 06/16/22  1438     History  Chief Complaint  Patient presents with   Wound Check    JANYAH KIRSCHENMANN is a 77 y.o. female.  Patient is a 77 year old female who presents with some bleeding from the catheter site.  She has a history of end-stage renal disease.  She had a permacath placed today at [facility] and her right upper chest.  This was related to a malfunctioning fistula.  She normally is on Coumadin but has not taken her Coumadin in the last 4 days.  She also has not restarted because she is scheduled to have her fistula repaired at the end of this week.  She had the permacath placed this morning and when she got home noticed some bleeding around the site.  She says it seems to have stopped but wanted to get it checked.       Home Medications Prior to Admission medications   Medication Sig Start Date End Date Taking? Authorizing Provider  acetaminophen (TYLENOL) 500 MG tablet Take 500 mg by mouth every 6 (six) hours as needed for headache (pain).    [provider]  Alfalfa 500 MG TABS Take 500 mg by mouth 3 (three) times daily.    [provider]  atorvastatin (LIPITOR) 20 MG tablet Take 20 mg by mouth at bedtime.    [provider]  b complex-vitamin c-folic acid (NEPHRO-VITE) 0.8 MG TABS tablet Take 1 tablet by mouth daily. 05/18/19   [provider]  carvedilol (COREG) 25 MG tablet Take 1 tablet by mouth twice daily 05/19/22   Rollene Rotunda, MD  cinacalcet (SENSIPAR) 30 MG tablet Take 30 mg by mouth at bedtime.    [provider]  cyclobenzaprine (FLEXERIL) 5 MG tablet Take 1-2 tablets (5-10 mg total) by mouth 2 (two) times daily as needed for muscle spasms. 05/05/22   Bradd Canary, MD  diltiazem (CARDIZEM CD) 240 MG 24 hr capsule Take 240 mg by mouth daily with lunch.    [provider]   Flaxseed, Linseed, (FLAX SEED OIL) 1000 MG CAPS Take 1,000 mg by mouth every evening.    [provider]  hydrALAZINE (APRESOLINE) 50 MG tablet Take 75 mg by mouth 2 (two) times daily. 03/23/20   [provider]  L-Glutamine 500 MG CAPS Take 500 mg by mouth every evening.    [provider]  lidocaine 4 % Place 1 patch onto the skin daily. 03/03/22   Clayborne Dana, NP  lidocaine-prilocaine (EMLA) cream Apply 1 application. topically every Monday, Wednesday, and Friday with hemodialysis. 03/16/20   [provider]  OVER THE COUNTER MEDICATION Take 3 capsules by mouth every evening. Shaklee Herb-Lax Natural Laxative for Colon Cleanse (Senna, Licorice, and Alfalfa)    [provider]  Probiotic Product (PROBIOTIC DAILY PO) Take 1 capsule by mouth in the morning.    [provider]  sevelamer carbonate (RENVELA) 800 MG tablet Take 2,400 mg by mouth See admin instructions. Take 3 tablets (2400 mg) by mouth with each meal & take 2 tablets (1600 mg) by mouth with each large snack 09/07/18   [provider]  vitamin C (ASCORBIC ACID) 500 MG tablet Take 500 mg by mouth daily.    [provider]  Vitamin E 400 units TABS Take 400 Units by mouth daily with lunch.  [provider]  warfarin (COUMADIN) 1 MG tablet Take 1 mg by mouth every Tuesday, Thursday, and Saturday at 6 PM. 03/04/21   [provider]  warfarin (COUMADIN) 5 MG tablet Take 1 tablet (5 mg total) by mouth daily at 4 PM. ONCE DAILY AS DIRECTED Patient taking differently: Take 5 mg by mouth every evening. 08/20/20   Jerene Bears, MD      Allergies    Sulfa antibiotics and Clonidine derivatives    Review of Systems   Review of Systems  Constitutional:  Negative for fever.  Respiratory:  Negative for shortness of breath.   Gastrointestinal:  Negative for nausea and vomiting.  Skin:  Positive for wound.    Physical Exam Updated Vital Signs BP (!)  186/66 (BP Location: Right Arm)   Pulse 77   Temp 97.9 F (36.6 C) (Oral)   Resp 20   Ht 5\' 6"  (1.676 m)   Wt 60.8 kg   SpO2 99%   BMI 21.63 kg/m  Physical Exam Cardiovascular:     Rate and Rhythm: Normal rate.     Comments: Catheter is intact to the right upper chest.  There is a small amount of dark blood around the insertion site.  There is a Tegaderm type dressing with what appears to be some kind of impregnated dressing directly over the insertion site.  There is no active bleeding.  No swelling around the site.  No tenderness around the site.  No warmth or erythema. Pulmonary:     Effort: Pulmonary effort is normal.  Skin:    General: Skin is warm and dry.  Neurological:     Mental Status: She is alert and oriented to person, place, and time.     ED Results / Procedures / Treatments   Labs (all labs ordered are listed, but only abnormal results are displayed) Labs Reviewed - No data to display  EKG None  Radiology No results found.  Procedures Procedures    Medications Ordered in ED Medications - No data to display  ED Course/ Medical Decision Making/ A&P                             Medical Decision Making  Patient is a 77 year old female who presents with some bleeding from catheter site insertion.  There is no active bleeding.  No swelling or hematoma around the site.  No signs of infection.  I was able to clean up some of the blood from the lower aspect of the dressing but did not want to completely remove the dressing as it appears to be some sort of impregnated type gauze.  The remainder of the dressing appears intact and relatively clean other than a small amount of blood right around the insertion site.  Patient has dialysis tomorrow and will have the dressing changed.  She is off Coumadin until after her fistula repair.  She was discharged home in good condition.  Return precautions were given.  Final Clinical Impression(s) / ED Diagnoses Final  diagnoses:  Visit for wound check    Rx / DC Orders ED Discharge Orders     None         Rolan Bucco, MD 06/16/22 [id number]

## 2022-06-17 DIAGNOSIS — N2581 Secondary hyperparathyroidism of renal origin: Secondary | ICD-10-CM | POA: Diagnosis not present

## 2022-06-17 DIAGNOSIS — N186 End stage renal disease: Secondary | ICD-10-CM | POA: Diagnosis not present

## 2022-06-17 DIAGNOSIS — D509 Iron deficiency anemia, unspecified: Secondary | ICD-10-CM | POA: Diagnosis not present

## 2022-06-17 DIAGNOSIS — D631 Anemia in chronic kidney disease: Secondary | ICD-10-CM | POA: Diagnosis not present

## 2022-06-19 DIAGNOSIS — Z7901 Long term (current) use of anticoagulants: Secondary | ICD-10-CM | POA: Diagnosis not present

## 2022-06-19 DIAGNOSIS — I4891 Unspecified atrial fibrillation: Secondary | ICD-10-CM | POA: Diagnosis not present

## 2022-06-19 DIAGNOSIS — T82590D Other mechanical complication of surgically created arteriovenous fistula, subsequent encounter: Secondary | ICD-10-CM | POA: Diagnosis not present

## 2022-06-19 DIAGNOSIS — N186 End stage renal disease: Secondary | ICD-10-CM | POA: Diagnosis not present

## 2022-06-19 DIAGNOSIS — M898X9 Other specified disorders of bone, unspecified site: Secondary | ICD-10-CM | POA: Diagnosis not present

## 2022-06-19 DIAGNOSIS — I721 Aneurysm of artery of upper extremity: Secondary | ICD-10-CM | POA: Diagnosis not present

## 2022-06-19 DIAGNOSIS — I482 Chronic atrial fibrillation, unspecified: Secondary | ICD-10-CM | POA: Diagnosis not present

## 2022-06-19 DIAGNOSIS — I120 Hypertensive chronic kidney disease with stage 5 chronic kidney disease or end stage renal disease: Secondary | ICD-10-CM | POA: Diagnosis not present

## 2022-06-19 DIAGNOSIS — D631 Anemia in chronic kidney disease: Secondary | ICD-10-CM | POA: Diagnosis not present

## 2022-06-19 DIAGNOSIS — E213 Hyperparathyroidism, unspecified: Secondary | ICD-10-CM | POA: Diagnosis not present

## 2022-06-19 DIAGNOSIS — Y828 Other medical devices associated with adverse incidents: Secondary | ICD-10-CM | POA: Diagnosis not present

## 2022-06-19 DIAGNOSIS — G4700 Insomnia, unspecified: Secondary | ICD-10-CM | POA: Diagnosis not present

## 2022-06-19 DIAGNOSIS — T82590A Other mechanical complication of surgically created arteriovenous fistula, initial encounter: Secondary | ICD-10-CM | POA: Diagnosis not present

## 2022-06-19 DIAGNOSIS — T82898A Other specified complication of vascular prosthetic devices, implants and grafts, initial encounter: Secondary | ICD-10-CM | POA: Diagnosis not present

## 2022-06-19 DIAGNOSIS — I6523 Occlusion and stenosis of bilateral carotid arteries: Secondary | ICD-10-CM | POA: Diagnosis not present

## 2022-06-19 DIAGNOSIS — Y712 Prosthetic and other implants, materials and accessory cardiovascular devices associated with adverse incidents: Secondary | ICD-10-CM | POA: Diagnosis not present

## 2022-06-19 DIAGNOSIS — R079 Chest pain, unspecified: Secondary | ICD-10-CM | POA: Diagnosis not present

## 2022-06-19 DIAGNOSIS — Z79899 Other long term (current) drug therapy: Secondary | ICD-10-CM | POA: Diagnosis not present

## 2022-06-19 DIAGNOSIS — Y832 Surgical operation with anastomosis, bypass or graft as the cause of abnormal reaction of the patient, or of later complication, without mention of misadventure at the time of the procedure: Secondary | ICD-10-CM | POA: Diagnosis not present

## 2022-06-19 DIAGNOSIS — T82510A Breakdown (mechanical) of surgically created arteriovenous fistula, initial encounter: Secondary | ICD-10-CM | POA: Diagnosis not present

## 2022-06-19 DIAGNOSIS — T82848A Pain from vascular prosthetic devices, implants and grafts, initial encounter: Secondary | ICD-10-CM | POA: Diagnosis not present

## 2022-06-19 DIAGNOSIS — Z992 Dependence on renal dialysis: Secondary | ICD-10-CM | POA: Diagnosis not present

## 2022-06-19 DIAGNOSIS — I770 Arteriovenous fistula, acquired: Secondary | ICD-10-CM | POA: Diagnosis not present

## 2022-06-20 DIAGNOSIS — I120 Hypertensive chronic kidney disease with stage 5 chronic kidney disease or end stage renal disease: Secondary | ICD-10-CM | POA: Diagnosis not present

## 2022-06-20 DIAGNOSIS — M898X9 Other specified disorders of bone, unspecified site: Secondary | ICD-10-CM | POA: Diagnosis not present

## 2022-06-20 DIAGNOSIS — I6523 Occlusion and stenosis of bilateral carotid arteries: Secondary | ICD-10-CM | POA: Diagnosis not present

## 2022-06-20 DIAGNOSIS — G4700 Insomnia, unspecified: Secondary | ICD-10-CM | POA: Diagnosis not present

## 2022-06-20 DIAGNOSIS — Y712 Prosthetic and other implants, materials and accessory cardiovascular devices associated with adverse incidents: Secondary | ICD-10-CM | POA: Diagnosis not present

## 2022-06-20 DIAGNOSIS — I4891 Unspecified atrial fibrillation: Secondary | ICD-10-CM | POA: Diagnosis not present

## 2022-06-20 DIAGNOSIS — Z79899 Other long term (current) drug therapy: Secondary | ICD-10-CM | POA: Diagnosis not present

## 2022-06-20 DIAGNOSIS — T82848A Pain from vascular prosthetic devices, implants and grafts, initial encounter: Secondary | ICD-10-CM | POA: Diagnosis not present

## 2022-06-20 DIAGNOSIS — I482 Chronic atrial fibrillation, unspecified: Secondary | ICD-10-CM | POA: Diagnosis not present

## 2022-06-20 DIAGNOSIS — T82510A Breakdown (mechanical) of surgically created arteriovenous fistula, initial encounter: Secondary | ICD-10-CM | POA: Diagnosis not present

## 2022-06-20 DIAGNOSIS — Y832 Surgical operation with anastomosis, bypass or graft as the cause of abnormal reaction of the patient, or of later complication, without mention of misadventure at the time of the procedure: Secondary | ICD-10-CM | POA: Diagnosis not present

## 2022-06-20 DIAGNOSIS — D631 Anemia in chronic kidney disease: Secondary | ICD-10-CM | POA: Diagnosis not present

## 2022-06-20 DIAGNOSIS — N186 End stage renal disease: Secondary | ICD-10-CM | POA: Diagnosis not present

## 2022-06-20 DIAGNOSIS — Z7901 Long term (current) use of anticoagulants: Secondary | ICD-10-CM | POA: Diagnosis not present

## 2022-06-20 DIAGNOSIS — Y828 Other medical devices associated with adverse incidents: Secondary | ICD-10-CM | POA: Diagnosis not present

## 2022-06-20 DIAGNOSIS — I721 Aneurysm of artery of upper extremity: Secondary | ICD-10-CM | POA: Diagnosis not present

## 2022-06-20 DIAGNOSIS — Z992 Dependence on renal dialysis: Secondary | ICD-10-CM | POA: Diagnosis not present

## 2022-06-20 DIAGNOSIS — T82590A Other mechanical complication of surgically created arteriovenous fistula, initial encounter: Secondary | ICD-10-CM | POA: Diagnosis not present

## 2022-06-22 ENCOUNTER — Encounter: Payer: Self-pay | Admitting: *Deleted

## 2022-06-22 ENCOUNTER — Telehealth: Payer: Self-pay | Admitting: *Deleted

## 2022-06-22 NOTE — Transitions of Care (Post Inpatient/ED Visit) (Signed)
06/22/2022  Name: Jillian Hayes MRN: [id number] DOB: 09-08-1945  Today's TOC FU Call Status: Today's TOC FU Call Status:: Successful TOC FU Call Competed TOC FU Call Complete Date: 06/22/22  Transition Care Management Follow-up Telephone Call Date of Discharge: 06/20/22 Discharge Facility: Other ([facility]) Name of [facility] ([facility]) [facility] Type of Discharge: Inpatient Admission Primary Inpatient Discharge Diagnosis:: ESRD- AV fistula malfunction with revision How have you been since you were released from the hospital?: Better ("I am doing better overall- I was constipated and up all night last night, but that has now resolved.  I called and am going to HD tomorrow which is off schedule-- but I will pick back up on my regular schedule soon") Any questions or concerns?: Yes Patient Questions/Concerns:: Unsure she is able to change dressing site today as advised by hospital discharge provider: verbalizes plans to ask HD team tomorrow to assist- she and her husband not able to do dressing change due to location; unsure why hospital discharge provider told her to stop taking renal vitamin Nephrovite Patient Questions/Concerns Addressed: Other: (confirmed that her plan to have HD team assist with dressing change tomorrow is a good plan; advised to ask about reasoning for discontinuance of Nephro-vite supplement)  Items Reviewed: Did you receive and understand the discharge instructions provided?: Yes (brief reviewed with patient who verbalizes good understanding of same- outside hospital AVS) Medications obtained,verified, and reconciled?: Yes (Medications Reviewed) (Full medication reconciliation/ review completed; no concerns or discrepancies identified; self-manages medications and denies questions/ concerns around medications today-- other than Nephrovite question: will discuss with renal provider; list updated) Any new allergies since your discharge?: No Dietary  orders reviewed?: Yes Type of Diet Ordered:: renal; coumadin Do you have support at home?: Yes People in Home: spouse Name of Support/Comfort Primary Source: Reports independent in self-care activities; spouse assists as/ if needed/ indicated  Medications Reviewed Today: Medications Reviewed Today     Reviewed by Michaela Corner, RN (Registered Nurse) on 06/22/22 at 1341  Med List Status: <None>   Medication Order Taking? Sig Documenting Provider Last Dose Status Informant  acetaminophen (TYLENOL) 500 MG tablet [id number] Yes Take 500 mg by mouth every 6 (six) hours as needed for headache (pain). [provider] Taking Active Self  Alfalfa 500 MG TABS [id number] Yes Take 500 mg by mouth 3 (three) times daily. [provider] Taking Active Self  atorvastatin (LIPITOR) 20 MG tablet [id number] Yes Take 20 mg by mouth at bedtime. [provider] Taking Active Self  b complex-vitamin c-folic acid (NEPHRO-VITE) 0.8 MG TABS tablet [id number] Yes Take 1 tablet by mouth daily. [provider] Taking Active            Med Note Marilu Favre Jun 22, 2022  1:37 PM) 06/22/22- reports was told by hospital discharging provider to stop taking at time of hosp discharge on 06/20/22  carvedilol (COREG) 25 MG tablet [id number] Yes Take 1 tablet by mouth twice daily Rollene Rotunda, MD Taking Active   cinacalcet (SENSIPAR) 30 MG tablet [id number] Yes Take 30 mg by mouth at bedtime. [provider] Taking Active Self  cyclobenzaprine (FLEXERIL) 5 MG tablet [id number] No Take 1-2 tablets (5-10 mg total) by mouth 2 (two) times daily as needed for muscle spasms.  Patient not taking: Reported on 06/22/2022   Bradd Canary, MD Not Taking Active   diltiazem (CARDIZEM CD) 240 MG 24 hr capsule [id number] Yes Take 240 mg by mouth daily  with lunch. [provider] Taking Active Self  Flaxseed, Linseed, (FLAX SEED OIL) 1000 MG CAPS [id number] No Take 1,000 mg by mouth every  evening.  Patient not taking: Reported on 06/22/2022   [provider] Not Taking Active Self  hydrALAZINE (APRESOLINE) 50 MG tablet [id number] Yes Take 75 mg by mouth 2 (two) times daily. [provider] Taking Active Self  L-Glutamine 500 MG CAPS [id number] Yes Take 500 mg by mouth every evening. [provider] Taking Active Self  lidocaine 4 % [id number] No Place 1 patch onto the skin daily.  Patient not taking: Reported on 06/22/2022   Clayborne Dana, NP Not Taking Active   lidocaine-prilocaine (EMLA) cream [id number] No Apply 1 application. topically every Monday, Wednesday, and Friday with hemodialysis.  Patient not taking: Reported on 06/22/2022   [provider] Not Taking Active Self  OVER THE COUNTER MEDICATION [id number] Yes Take 3 capsules by mouth every evening. Shaklee Herb-Lax Natural Laxative for Colon Cleanse (Senna, Licorice, and Alfalfa) [provider] Taking Active Self  Probiotic Product (PROBIOTIC DAILY PO) [id number] No Take 1 capsule by mouth in the morning.  Patient not taking: Reported on 06/22/2022   [provider] Not Taking Active Self  sevelamer carbonate (RENVELA) 800 MG tablet [id number] Yes Take 2,400 mg by mouth See admin instructions. Take 3 tablets (2400 mg) by mouth with each meal & take 2 tablets (1600 mg) by mouth with each large snack [provider] Taking Active Self  triamcinolone acetonide (KENALOG-40) injection 40 mg [id number]   Myra Rude, MD  Active   vitamin C (ASCORBIC ACID) 500 MG tablet [id number] Yes Take 500 mg by mouth daily. [provider] Taking Active Self  Vitamin E 400 units TABS [id number] No Take 400 Units by mouth daily with lunch.  Patient not taking: Reported on 06/22/2022   [provider] Not Taking Active Self  warfarin (COUMADIN) 1 MG tablet [id number] Yes Take 1 mg by mouth every Tuesday, Thursday, and Saturday at 6 PM. [provider] Taking  Active Self           Med Note Marilu Favre Jun 22, 2022  1:31 PM) 06/22/22- reports taking on Tuesday and Thursday   warfarin (COUMADIN) 5 MG tablet [id number] Yes Take 1 tablet (5 mg total) by mouth daily at 4 PM. ONCE DAILY AS DIRECTED  Patient taking differently: Take 5 mg by mouth every evening.   Jerene Bears, MD Taking Active Self           Med Note Marilu Favre Jun 22, 2022  1:31 PM) 06/22/22- reports taking on Tuesday and Thursday            Home Care and Equipment/Supplies: Were Home Health Services Ordered?: No Any new equipment or medical supplies ordered?: No  Functional Questionnaire: Do you need assistance with bathing/showering or dressing?: No Do you need assistance with meal preparation?: No Do you need assistance with eating?: No Do you have difficulty maintaining continence: No Do you need assistance with getting out of bed/getting out of a chair/moving?: No Do you have difficulty managing or taking your medications?: No  Follow up appointments reviewed: PCP Follow-up appointment confirmed?: Yes (care coordination outreach in real-time with scheduling care guide to successfully schedule hospital follow up PCP appointment 06/30/22) Date of PCP follow-up appointment?: 06/30/22 Follow-up Provider: PCP- covering provider/ NP Specialist Hospital Follow-up appointment confirmed?: No Reason Specialist Follow-Up Not  Confirmed: Patient has Specialist Provider Number and will Call for Appointment Do you need transportation to your follow-up appointment?: No Do you understand care options if your condition(s) worsen?: Yes-patient verbalized understanding  SDOH Interventions Today    Flowsheet Row Most Recent Value  SDOH Interventions   Food Insecurity Interventions Intervention Not Indicated  Transportation Interventions Intervention Not Indicated  [husband provides transportation]      TOC Interventions Today    Flowsheet Row Most Recent  Value  TOC Interventions   TOC Interventions Discussed/Reviewed TOC Interventions Discussed, Arranged PCP follow up less than 12 days/Care Guide scheduled  [provided my direct contact information should questions/ concerns/ needs arise post-TOC call, prior to RN CM telephone visit]      Interventions Today    Flowsheet Row Most Recent Value  Chronic Disease   Chronic disease during today's visit Chronic Kidney Disease/End Stage Renal Disease (ESRD)  [AV fistula malfunction with surgical revision]  General Interventions   General Interventions Discussed/Reviewed General Interventions Discussed, Doctor Visits, Referral to Nurse, Communication with, Durable Medical Equipment (DME)  Doctor Visits Discussed/Reviewed Doctor Visits Discussed, Specialist, PCP  Durable Medical Equipment (DME) Other  [reports currently does not require assisitve devices]  PCP/Specialist Visits Compliance with follow-up visit  Communication with RN  Nutrition Interventions   Nutrition Discussed/Reviewed Nutrition Discussed  Pharmacy Interventions   Pharmacy Dicussed/Reviewed Pharmacy Topics Discussed  [Full medication review with updating medication list in EHR per patient report]      Caryl Pina, RN, BSN, CCRN Alumnus RN CM Care Coordination/ Transition of Care- [facility] Care Management [phone]: direct office

## 2022-06-23 ENCOUNTER — Ambulatory Visit: Payer: Medicare HMO | Admitting: Physical Medicine and Rehabilitation

## 2022-06-23 DIAGNOSIS — D631 Anemia in chronic kidney disease: Secondary | ICD-10-CM | POA: Diagnosis not present

## 2022-06-23 DIAGNOSIS — Z7901 Long term (current) use of anticoagulants: Secondary | ICD-10-CM | POA: Diagnosis not present

## 2022-06-23 DIAGNOSIS — N186 End stage renal disease: Secondary | ICD-10-CM | POA: Diagnosis not present

## 2022-06-23 DIAGNOSIS — N2581 Secondary hyperparathyroidism of renal origin: Secondary | ICD-10-CM | POA: Diagnosis not present

## 2022-06-23 DIAGNOSIS — D509 Iron deficiency anemia, unspecified: Secondary | ICD-10-CM | POA: Diagnosis not present

## 2022-06-23 NOTE — Progress Notes (Addendum)
Subjective:   By signing my name below, I, Doylene Bode, attest that this documentation has been prepared under the direction and in the presence of Lemont Fillers, NP 06/30/22   Patient ID: Jillian Hayes, female    DOB: 03-Feb-1945, 77 y.o.   MRN: [id number]  No chief complaint on file.   HPI Patient is in today for a hospital/ED follow-up.  End stage renal failure: she was admitted to the hospital from 06/19/22 - 06/20/22 for malfunction of anteriovenous dialysis fistula, hemodialysis AV fistula aneurysm. History of end stage renal disease, dependence on renal dialysis. Status post revision AV fistula left 06/19/22. She is currently fatigued due to lack of sleep. She has had insomnia for the past year and tried a variety of remedies without relief. Appetite normal. Has been receiving dialysis every Monday since 2020.  Vitamin B12 deficiency: takes B12 supplement.  Hyperlipidemia: treated with Lipitor 20 mg daily at bedtime.  Hypertension: treated with carvedilol 25 mg twice daily, Cardizem 240 mg daily. Blood pressure slightly elevated at 133/46.  Past Medical History:  Diagnosis Date   Advanced care planning/counseling discussion 06/10/2014   05/31/2014 patient presents copy of HCP and Living Will   Anemia    iron deficiency   Anxiety    Arthritis    "in hands"   Atrial fibrillation (HCC)    Benign fundic gland polyps of stomach    Bladder polyps 06/25/2010   Chronic headaches 06/24/2010   Complication of anesthesia    "woke up at the end of a cyst removal surgery in 1991"   Depression 1991   hospitalized   Diverticulosis    ESRD (end stage renal disease) (HCC) 11/09/2015   HD on MWF   External prolapsed hemorrhoids    History of chicken pox 06/25/2010   Hypercalcemia 02/18/2014   Hypertension    Insomnia 06/24/2010   Multiple chemical sensitivity syndrome 06/25/2010   Proteinuria 02/18/2014   Renal insufficiency 03/26/2011   Valvular heart disease  04/28/2016    Past Surgical History:  Procedure Laterality Date   AV FISTULA PLACEMENT Left    x4   COLONOSCOPY  2014   cyst on left breast removed Left 1991   benign   ESOPHAGOGASTRODUODENOSCOPY (EGD) WITH ESOPHAGEAL DILATION  2014   LAPAROSCOPIC BILATERAL SALPINGO OOPHERECTOMY Bilateral 08/20/2020   Procedure: LAPAROSCOPIC BILATERAL SALPINGO OOPHORECTOMY;  Surgeon: Jerene Bears, MD;  Location: [facility] OR;  Service: Gynecology;  Laterality: Bilateral;   NASAL SEPTUM SURGERY  1986   rhinoplasty   polyps on bladder removed  1972   benign   RIGHT HEART CATH N/A 04/10/2021   Procedure: RIGHT HEART CATH;  Surgeon: Laurey Morale, MD;  Location: [facility] INVASIVE CV LAB;  Service: Cardiovascular;  Laterality: N/A;   TONSILLECTOMY  1962    Family History  Problem Relation Age of Onset   Breast cancer Mother 35       left breast removed, 2010 lung   Diabetes Mother    Nephrolithiasis Father    Heart attack Father 41       X 3   Heart disease Father        smoker   Hypertension Brother    Allergic rhinitis Brother    Melanoma Son 37       melanoma on leg removed   Pancreatic cancer Maternal Grandmother    Hypertension Paternal Grandmother    Obesity Paternal Grandmother    Heart attack Paternal Grandfather 20   Diabetes Maternal Aunt  x 2   Diabetes Maternal Uncle        x 3   Asthma Neg Hx    Eczema Neg Hx    Urticaria Neg Hx    Immunodeficiency Neg Hx    Angioedema Neg Hx     Social History   Socioeconomic History   Marital status: Married    Spouse name: Not on file   Number of children: 2   Years of education: Not on file   Highest education level: Not on file  Occupational History   Occupation: retired  Tobacco Use   Smoking status: Never   Smokeless tobacco: Never  Vaping Use   Vaping Use: Never used  Substance and Sexual Activity   Alcohol use: No   Drug use: No   Sexual activity: Yes    Partners: Male  Other Topics Concern   Not on file  Social  History Narrative   Married and lives with husband.     Retired   1 son and 1 daughter   No alcohol tobacco or caffeine   Social Determinants of Health   Financial Resource Strain: Low Risk  (01/30/2021)   Overall Financial Resource Strain (CARDIA)    Difficulty of Paying Living Expenses: Not hard at all  Food Insecurity: No Food Insecurity (06/22/2022)   Hunger Vital Sign    Worried About Running Out of Food in the Last Year: Never true    Ran Out of Food in the Last Year: Never true  Transportation Needs: No Transportation Needs (06/22/2022)   PRAPARE - Administrator, Civil Service (Medical): No    Lack of Transportation (Non-Medical): No  Physical Activity: Insufficiently Active (01/30/2021)   Exercise Vital Sign    Days of Exercise per Week: 7 days    Minutes of Exercise per Session: 20 min  Stress: No Stress Concern Present (01/30/2021)   [employer] of Occupational Health - Occupational Stress Questionnaire    Feeling of Stress : Not at all  Social Connections: Moderately Integrated (01/30/2021)   Social Connection and Isolation Panel [NHANES]    Frequency of Communication with Friends and Family: More than three times a week    Frequency of Social Gatherings with Friends and Family: More than three times a week    Attends Religious Services: More than 4 times per year    Active Member of [employer] or Organizations: No    Attends Banker Meetings: Never    Marital Status: Married  Catering manager Violence: Not At Risk (05/05/2022)   Humiliation, Afraid, Rape, and Kick questionnaire    Fear of Current or Ex-Partner: No    Emotionally Abused: No    Physically Abused: No    Sexually Abused: No    Outpatient Medications Prior to Visit  Medication Sig Dispense Refill   acetaminophen (TYLENOL) 500 MG tablet Take 500 mg by mouth every 6 (six) hours as needed for headache (pain).     Alfalfa 500 MG TABS Take 500 mg by mouth 3 (three) times daily.      atorvastatin (LIPITOR) 20 MG tablet Take 20 mg by mouth at bedtime.     b complex-vitamin c-folic acid (NEPHRO-VITE) 0.8 MG TABS tablet Take 1 tablet by mouth daily.     carvedilol (COREG) 25 MG tablet Take 1 tablet by mouth twice daily 180 tablet 0   cinacalcet (SENSIPAR) 30 MG tablet Take 30 mg by mouth at bedtime.     diltiazem (CARDIZEM CD) 240  MG 24 hr capsule Take 240 mg by mouth daily with lunch.     hydrALAZINE (APRESOLINE) 50 MG tablet Take 75 mg by mouth 2 (two) times daily.     L-Glutamine 500 MG CAPS Take 500 mg by mouth every evening.     lidocaine 4 % Place 1 patch onto the skin daily. 10 patch 1   lidocaine-prilocaine (EMLA) cream Apply 1 application  topically every Monday, Wednesday, and Friday with hemodialysis.     OVER THE COUNTER MEDICATION Take 3 capsules by mouth every evening. Shaklee Herb-Lax Natural Laxative for Colon Cleanse (Senna, Licorice, and Alfalfa)     Probiotic Product (PROBIOTIC DAILY PO) Take 1 capsule by mouth in the morning.     sevelamer carbonate (RENVELA) 800 MG tablet Take 2,400 mg by mouth See admin instructions. Take 3 tablets (2400 mg) by mouth with each meal & take 2 tablets (1600 mg) by mouth with each large snack     vitamin C (ASCORBIC ACID) 500 MG tablet Take 500 mg by mouth daily.     warfarin (COUMADIN) 1 MG tablet Take 1 mg by mouth every Tuesday, Thursday, and Saturday at 6 PM.     warfarin (COUMADIN) 5 MG tablet Take 1 tablet (5 mg total) by mouth daily at 4 PM. ONCE DAILY AS DIRECTED (Patient taking differently: Take 5 mg by mouth every evening.) 30 tablet 0   cyclobenzaprine (FLEXERIL) 5 MG tablet Take 1-2 tablets (5-10 mg total) by mouth 2 (two) times daily as needed for muscle spasms. (Patient not taking: Reported on 06/22/2022) 30 tablet 1   Flaxseed, Linseed, (FLAX SEED OIL) 1000 MG CAPS Take 1,000 mg by mouth every evening. (Patient not taking: Reported on 06/22/2022)     Vitamin E 400 units TABS Take 400 Units by mouth daily with  lunch. (Patient not taking: Reported on 06/22/2022)     Facility-Administered Medications Prior to Visit  Medication Dose Route Frequency Provider Last Rate Last Admin   triamcinolone acetonide (KENALOG-40) injection 40 mg  40 mg Intra-articular Once Myra Rude, MD        Allergies  Allergen Reactions   Sulfa Antibiotics Nausea And Vomiting   Clonidine Derivatives Palpitations    Review of Systems  Constitutional:  Positive for malaise/fatigue (much improved per patient). Negative for fever.  HENT:  Negative for congestion.   Eyes:  Negative for blurred vision.  Respiratory:  Negative for cough and shortness of breath.   Cardiovascular:  Negative for chest pain, palpitations and leg swelling.  Gastrointestinal:  Negative for vomiting.  Musculoskeletal:  Negative for back pain.  Skin:  Negative for rash.  Neurological:  Negative for loss of consciousness and headaches.       Objective:    Physical Exam Vitals and nursing note reviewed.  Constitutional:      General: She is not in acute distress.    Appearance: She is well-developed. She is not ill-appearing or toxic-appearing.  Cardiovascular:     Rate and Rhythm: Normal rate and regular rhythm. No extrasystoles are present.    Pulses: Normal pulses.     Heart sounds: Normal heart sounds, S1 normal and S2 normal. No murmur heard.    No friction rub. No gallop.  Pulmonary:     Effort: Pulmonary effort is normal.     Breath sounds: Normal breath sounds.  Skin:    General: Skin is warm and dry.     Comments: Upper left arm sutures clean, dry, intact, no erythema.  Neurological:     Mental Status: She is alert.     GCS: GCS eye subscore is 4. GCS verbal subscore is 5. GCS motor subscore is 6.  Psychiatric:        Speech: Speech normal.        Behavior: Behavior normal. Behavior is cooperative.     BP (!) 133/46 (BP Location: Right Arm, Cuff Size: Normal)   Pulse 70   Temp 98 F (36.7 C) (Oral)   Resp 12   Ht  5\' 6"  (1.676 m)   Wt 137 lb 12.8 oz (62.5 kg)   SpO2 98%   BMI 22.24 kg/m  Wt Readings from Last 3 Encounters:  06/30/22 137 lb 12.8 oz (62.5 kg)  06/16/22 134 lb (60.8 kg)  06/03/22 136 lb (61.7 kg)       Assessment & Plan:  Osteoporosis, unspecified osteoporosis type, unspecified pathological fracture presence -     DG Bone Density; Future  Paroxysmal atrial fibrillation [facility]) Assessment & Plan: On coumadin, INR being checked at HD every Monday.     Disorder of vitamin B12 -     Vitamin B12  Mixed hyperlipidemia Assessment & Plan: Lab Results  Component Value Date   CHOL 157 12/04/2021   HDL 100.70 12/04/2021   LDLCALC 45 12/04/2021   TRIG 59.0 12/04/2021   CHOLHDL 2 12/04/2021   Maintained on lipitor 20mg . Last LDL at goal.    Malfunction of arteriovenous dialysis fistula, subsequent encounter Assessment & Plan: S/p repair during hospitalization.  She is being dialyzed through her central catheter for the time being while her fistula heals. She has follow up scheduled with vascular for suture removal.  Incision looks good today.    A-V fistula (HCC)  Insomnia, unspecified type Assessment & Plan: Unchanged- this has been a long standing problem for her.    Hypertension, unspecified type Assessment & Plan: BP is stable. Pt is maintained on carvedilol, diltiazem and hydralazine.       I,Alexander Ruley,acting as a Neurosurgeon for [employer], NP.,have documented all relevant documentation on the behalf of Lemont Fillers, NP,as directed by  Lemont Fillers, NP while in the presence of Lemont Fillers, NP.

## 2022-06-25 DIAGNOSIS — D631 Anemia in chronic kidney disease: Secondary | ICD-10-CM | POA: Diagnosis not present

## 2022-06-25 DIAGNOSIS — D509 Iron deficiency anemia, unspecified: Secondary | ICD-10-CM | POA: Diagnosis not present

## 2022-06-25 DIAGNOSIS — N186 End stage renal disease: Secondary | ICD-10-CM | POA: Diagnosis not present

## 2022-06-25 DIAGNOSIS — N2581 Secondary hyperparathyroidism of renal origin: Secondary | ICD-10-CM | POA: Diagnosis not present

## 2022-06-27 DIAGNOSIS — D509 Iron deficiency anemia, unspecified: Secondary | ICD-10-CM | POA: Diagnosis not present

## 2022-06-27 DIAGNOSIS — D631 Anemia in chronic kidney disease: Secondary | ICD-10-CM | POA: Diagnosis not present

## 2022-06-27 DIAGNOSIS — N186 End stage renal disease: Secondary | ICD-10-CM | POA: Diagnosis not present

## 2022-06-27 DIAGNOSIS — N2581 Secondary hyperparathyroidism of renal origin: Secondary | ICD-10-CM | POA: Diagnosis not present

## 2022-06-29 DIAGNOSIS — N2581 Secondary hyperparathyroidism of renal origin: Secondary | ICD-10-CM | POA: Diagnosis not present

## 2022-06-29 DIAGNOSIS — D509 Iron deficiency anemia, unspecified: Secondary | ICD-10-CM | POA: Diagnosis not present

## 2022-06-29 DIAGNOSIS — N186 End stage renal disease: Secondary | ICD-10-CM | POA: Diagnosis not present

## 2022-06-29 DIAGNOSIS — Z7901 Long term (current) use of anticoagulants: Secondary | ICD-10-CM | POA: Diagnosis not present

## 2022-06-29 DIAGNOSIS — D631 Anemia in chronic kidney disease: Secondary | ICD-10-CM | POA: Diagnosis not present

## 2022-06-30 ENCOUNTER — Ambulatory Visit (INDEPENDENT_AMBULATORY_CARE_PROVIDER_SITE_OTHER): Payer: Medicare HMO | Admitting: Family

## 2022-06-30 ENCOUNTER — Encounter: Payer: Self-pay | Admitting: Family

## 2022-06-30 VITALS — BP 133/46 | HR 70 | Temp 98.0°F | Resp 12 | Ht 66.0 in | Wt 137.8 lb

## 2022-06-30 DIAGNOSIS — M810 Age-related osteoporosis without current pathological fracture: Secondary | ICD-10-CM

## 2022-06-30 DIAGNOSIS — I770 Arteriovenous fistula, acquired: Secondary | ICD-10-CM | POA: Diagnosis not present

## 2022-06-30 DIAGNOSIS — E538 Deficiency of other specified B group vitamins: Secondary | ICD-10-CM

## 2022-06-30 DIAGNOSIS — I10 Essential (primary) hypertension: Secondary | ICD-10-CM | POA: Diagnosis not present

## 2022-06-30 DIAGNOSIS — E782 Mixed hyperlipidemia: Secondary | ICD-10-CM

## 2022-06-30 DIAGNOSIS — I480 Paroxysmal atrial fibrillation: Secondary | ICD-10-CM | POA: Diagnosis not present

## 2022-06-30 DIAGNOSIS — G4700 Insomnia, unspecified: Secondary | ICD-10-CM

## 2022-06-30 DIAGNOSIS — T82590D Other mechanical complication of surgically created arteriovenous fistula, subsequent encounter: Secondary | ICD-10-CM

## 2022-06-30 DIAGNOSIS — T82590A Other mechanical complication of surgically created arteriovenous fistula, initial encounter: Secondary | ICD-10-CM | POA: Insufficient documentation

## 2022-06-30 NOTE — Assessment & Plan Note (Signed)
BP is stable. Pt is maintained on carvedilol, diltiazem and hydralazine.

## 2022-06-30 NOTE — Assessment & Plan Note (Signed)
Unchanged- this has been a long standing problem for her.

## 2022-06-30 NOTE — Assessment & Plan Note (Deleted)
S/p repair during hospitalization.  She is being dialyzed through her central catheter for the time being while her fistula heals. She has follow up scheduled with vascular for suture removal.  Incision looks good today.  

## 2022-06-30 NOTE — Assessment & Plan Note (Signed)
Lab Results  Component Value Date   CHOL 157 12/04/2021   HDL 100.70 12/04/2021   LDLCALC 45 12/04/2021   TRIG 59.0 12/04/2021   CHOLHDL 2 12/04/2021   Maintained on lipitor 20mg . Last LDL at goal.

## 2022-06-30 NOTE — Assessment & Plan Note (Signed)
On coumadin, INR being checked at HD every Monday.

## 2022-06-30 NOTE — Assessment & Plan Note (Signed)
S/p repair during hospitalization.  She is being dialyzed through her central catheter for the time being while her fistula heals. She has follow up scheduled with vascular for suture removal.  Incision looks good today.

## 2022-07-01 ENCOUNTER — Other Ambulatory Visit: Payer: Self-pay | Admitting: Family

## 2022-07-01 DIAGNOSIS — D631 Anemia in chronic kidney disease: Secondary | ICD-10-CM | POA: Diagnosis not present

## 2022-07-01 DIAGNOSIS — N2581 Secondary hyperparathyroidism of renal origin: Secondary | ICD-10-CM | POA: Diagnosis not present

## 2022-07-01 DIAGNOSIS — D509 Iron deficiency anemia, unspecified: Secondary | ICD-10-CM | POA: Diagnosis not present

## 2022-07-01 DIAGNOSIS — N186 End stage renal disease: Secondary | ICD-10-CM | POA: Diagnosis not present

## 2022-07-01 LAB — VITAMIN B12: Vitamin B-12: 1076 pg/mL — ABNORMAL HIGH (ref 211–911)

## 2022-07-02 DIAGNOSIS — T82590D Other mechanical complication of surgically created arteriovenous fistula, subsequent encounter: Secondary | ICD-10-CM | POA: Diagnosis not present

## 2022-07-02 DIAGNOSIS — N186 End stage renal disease: Secondary | ICD-10-CM | POA: Diagnosis not present

## 2022-07-02 DIAGNOSIS — Z992 Dependence on renal dialysis: Secondary | ICD-10-CM | POA: Diagnosis not present

## 2022-07-03 DIAGNOSIS — N2581 Secondary hyperparathyroidism of renal origin: Secondary | ICD-10-CM | POA: Diagnosis not present

## 2022-07-03 DIAGNOSIS — N186 End stage renal disease: Secondary | ICD-10-CM | POA: Diagnosis not present

## 2022-07-03 DIAGNOSIS — D509 Iron deficiency anemia, unspecified: Secondary | ICD-10-CM | POA: Diagnosis not present

## 2022-07-03 DIAGNOSIS — Z992 Dependence on renal dialysis: Secondary | ICD-10-CM | POA: Diagnosis not present

## 2022-07-03 DIAGNOSIS — D631 Anemia in chronic kidney disease: Secondary | ICD-10-CM | POA: Diagnosis not present

## 2022-07-06 DIAGNOSIS — D631 Anemia in chronic kidney disease: Secondary | ICD-10-CM | POA: Diagnosis not present

## 2022-07-06 DIAGNOSIS — D509 Iron deficiency anemia, unspecified: Secondary | ICD-10-CM | POA: Diagnosis not present

## 2022-07-06 DIAGNOSIS — N186 End stage renal disease: Secondary | ICD-10-CM | POA: Diagnosis not present

## 2022-07-06 DIAGNOSIS — N2581 Secondary hyperparathyroidism of renal origin: Secondary | ICD-10-CM | POA: Diagnosis not present

## 2022-07-06 DIAGNOSIS — Z7901 Long term (current) use of anticoagulants: Secondary | ICD-10-CM | POA: Diagnosis not present

## 2022-07-07 ENCOUNTER — Ambulatory Visit: Payer: Self-pay

## 2022-07-07 NOTE — Patient Outreach (Signed)
  Care Coordination   Initial Visit Note   07/07/2022 Name: Jillian Hayes MRN: [id number] DOB: 04-Jun-1945  Jillian Hayes is a 77 y.o. year old female who sees Bradd Canary, MD for primary care. I spoke with  Cyndia Skeeters by phone today.  What matters to the patients health and wellness today?  Admitted 5/17-5/18 dialysis AV fistula malfunction; hemodialysis AV fistula aneurysm; post AV fistula repair. Patient reports she is doing well post procedure. She reports some itching under chest dressing and states providers aware. Reports area has been redressed by providers.  She denies any questions or concerns at this time.  Goals Addressed             This Visit's Progress    Continue to improve post hospitalization       Interventions Today    Flowsheet Row Most Recent Value  Chronic Disease   Chronic disease during today's visit Other  [post procedure AF fistula]  General Interventions   General Interventions Discussed/Reviewed General Interventions Discussed, Doctor Visits  Doctor Visits Discussed/Reviewed Doctor Visits Discussed, PCP, Specialist  [Reviewed upcoming appointments]  Education Interventions   Education Provided Provided Education  Provided Verbal Education On Other  Nutrition Interventions   Nutrition Discussed/Reviewed Nutrition Discussed  [assess nutrition education needs-patient reports she obtains nutrition education from dietician at the HD center.]  Pharmacy Interventions   Pharmacy Dicussed/Reviewed Pharmacy Topics Discussed  [medications reviewed]            SDOH assessments and interventions completed:  Yes  SDOH Interventions Today    Flowsheet Row Most Recent Value  SDOH Interventions   Food Insecurity Interventions Intervention Not Indicated  Housing Interventions Intervention Not Indicated  Transportation Interventions Intervention Not Indicated  Utilities Interventions Intervention Not Indicated     Care Coordination Interventions:   Yes, provided   Follow up plan: Follow up call scheduled for 07/28/22    Encounter Outcome:  Pt. Visit Completed   Kathyrn Sheriff, RN, MSN, BSN, CCM [facility] Care Coordinator [phone]

## 2022-07-07 NOTE — Patient Instructions (Signed)
Visit Information  Thank you for taking time to visit with me today. Please don't hesitate to contact me if I can be of assistance to you.   Following are the goals we discussed today:  Continue to take medications as prescribed Continue to attend provider visits as scheduled Notify provider with any health questions or concerns as needed Contact Nutritionist at HD center for dietary questions as needed Financial controller as needed for resource needs Contact RN Case Manaager for care coordination needs as needed  Our next appointment is by telephone on 07/28/22 at 1:30 pm  Please call the care guide team at [phone] if you need to cancel or reschedule your appointment.   If you are experiencing a Mental Health or Behavioral Health Crisis or need someone to talk to, please call the Suicide and Crisis Lifeline: 50  Kathyrn Sheriff, RN, MSN, BSN, CCM [facility] Care Coordinator [phone]

## 2022-07-08 DIAGNOSIS — D631 Anemia in chronic kidney disease: Secondary | ICD-10-CM | POA: Diagnosis not present

## 2022-07-08 DIAGNOSIS — N186 End stage renal disease: Secondary | ICD-10-CM | POA: Diagnosis not present

## 2022-07-08 DIAGNOSIS — N2581 Secondary hyperparathyroidism of renal origin: Secondary | ICD-10-CM | POA: Diagnosis not present

## 2022-07-08 DIAGNOSIS — D509 Iron deficiency anemia, unspecified: Secondary | ICD-10-CM | POA: Diagnosis not present

## 2022-07-10 DIAGNOSIS — D509 Iron deficiency anemia, unspecified: Secondary | ICD-10-CM | POA: Diagnosis not present

## 2022-07-10 DIAGNOSIS — N2581 Secondary hyperparathyroidism of renal origin: Secondary | ICD-10-CM | POA: Diagnosis not present

## 2022-07-10 DIAGNOSIS — D631 Anemia in chronic kidney disease: Secondary | ICD-10-CM | POA: Diagnosis not present

## 2022-07-10 DIAGNOSIS — N186 End stage renal disease: Secondary | ICD-10-CM | POA: Diagnosis not present

## 2022-07-13 DIAGNOSIS — D631 Anemia in chronic kidney disease: Secondary | ICD-10-CM | POA: Diagnosis not present

## 2022-07-13 DIAGNOSIS — Z7901 Long term (current) use of anticoagulants: Secondary | ICD-10-CM | POA: Diagnosis not present

## 2022-07-13 DIAGNOSIS — N186 End stage renal disease: Secondary | ICD-10-CM | POA: Diagnosis not present

## 2022-07-13 DIAGNOSIS — D509 Iron deficiency anemia, unspecified: Secondary | ICD-10-CM | POA: Diagnosis not present

## 2022-07-13 DIAGNOSIS — N2581 Secondary hyperparathyroidism of renal origin: Secondary | ICD-10-CM | POA: Diagnosis not present

## 2022-07-14 DIAGNOSIS — I2510 Atherosclerotic heart disease of native coronary artery without angina pectoris: Secondary | ICD-10-CM | POA: Diagnosis not present

## 2022-07-14 DIAGNOSIS — I4891 Unspecified atrial fibrillation: Secondary | ICD-10-CM | POA: Diagnosis not present

## 2022-07-14 DIAGNOSIS — I120 Hypertensive chronic kidney disease with stage 5 chronic kidney disease or end stage renal disease: Secondary | ICD-10-CM | POA: Diagnosis not present

## 2022-07-14 DIAGNOSIS — N186 End stage renal disease: Secondary | ICD-10-CM | POA: Diagnosis not present

## 2022-07-14 DIAGNOSIS — E785 Hyperlipidemia, unspecified: Secondary | ICD-10-CM | POA: Diagnosis not present

## 2022-07-14 DIAGNOSIS — M1990 Unspecified osteoarthritis, unspecified site: Secondary | ICD-10-CM | POA: Diagnosis not present

## 2022-07-14 DIAGNOSIS — Z008 Encounter for other general examination: Secondary | ICD-10-CM | POA: Diagnosis not present

## 2022-07-14 DIAGNOSIS — K5900 Constipation, unspecified: Secondary | ICD-10-CM | POA: Diagnosis not present

## 2022-07-14 DIAGNOSIS — Z803 Family history of malignant neoplasm of breast: Secondary | ICD-10-CM | POA: Diagnosis not present

## 2022-07-14 DIAGNOSIS — D6869 Other thrombophilia: Secondary | ICD-10-CM | POA: Diagnosis not present

## 2022-07-14 DIAGNOSIS — R32 Unspecified urinary incontinence: Secondary | ICD-10-CM | POA: Diagnosis not present

## 2022-07-14 DIAGNOSIS — Z8249 Family history of ischemic heart disease and other diseases of the circulatory system: Secondary | ICD-10-CM | POA: Diagnosis not present

## 2022-07-14 DIAGNOSIS — Z823 Family history of stroke: Secondary | ICD-10-CM | POA: Diagnosis not present

## 2022-07-15 DIAGNOSIS — D631 Anemia in chronic kidney disease: Secondary | ICD-10-CM | POA: Diagnosis not present

## 2022-07-15 DIAGNOSIS — N186 End stage renal disease: Secondary | ICD-10-CM | POA: Diagnosis not present

## 2022-07-15 DIAGNOSIS — N2581 Secondary hyperparathyroidism of renal origin: Secondary | ICD-10-CM | POA: Diagnosis not present

## 2022-07-15 DIAGNOSIS — D509 Iron deficiency anemia, unspecified: Secondary | ICD-10-CM | POA: Diagnosis not present

## 2022-07-17 DIAGNOSIS — D509 Iron deficiency anemia, unspecified: Secondary | ICD-10-CM | POA: Diagnosis not present

## 2022-07-17 DIAGNOSIS — N186 End stage renal disease: Secondary | ICD-10-CM | POA: Diagnosis not present

## 2022-07-17 DIAGNOSIS — D631 Anemia in chronic kidney disease: Secondary | ICD-10-CM | POA: Diagnosis not present

## 2022-07-17 DIAGNOSIS — N2581 Secondary hyperparathyroidism of renal origin: Secondary | ICD-10-CM | POA: Diagnosis not present

## 2022-07-20 DIAGNOSIS — D631 Anemia in chronic kidney disease: Secondary | ICD-10-CM | POA: Diagnosis not present

## 2022-07-20 DIAGNOSIS — Z7901 Long term (current) use of anticoagulants: Secondary | ICD-10-CM | POA: Diagnosis not present

## 2022-07-20 DIAGNOSIS — N2581 Secondary hyperparathyroidism of renal origin: Secondary | ICD-10-CM | POA: Diagnosis not present

## 2022-07-20 DIAGNOSIS — N186 End stage renal disease: Secondary | ICD-10-CM | POA: Diagnosis not present

## 2022-07-20 DIAGNOSIS — D509 Iron deficiency anemia, unspecified: Secondary | ICD-10-CM | POA: Diagnosis not present

## 2022-07-21 ENCOUNTER — Ambulatory Visit: Payer: Medicare HMO | Admitting: Physical Medicine and Rehabilitation

## 2022-07-22 DIAGNOSIS — N186 End stage renal disease: Secondary | ICD-10-CM | POA: Diagnosis not present

## 2022-07-22 DIAGNOSIS — D631 Anemia in chronic kidney disease: Secondary | ICD-10-CM | POA: Diagnosis not present

## 2022-07-22 DIAGNOSIS — D509 Iron deficiency anemia, unspecified: Secondary | ICD-10-CM | POA: Diagnosis not present

## 2022-07-22 DIAGNOSIS — N2581 Secondary hyperparathyroidism of renal origin: Secondary | ICD-10-CM | POA: Diagnosis not present

## 2022-07-24 DIAGNOSIS — N186 End stage renal disease: Secondary | ICD-10-CM | POA: Diagnosis not present

## 2022-07-24 DIAGNOSIS — D509 Iron deficiency anemia, unspecified: Secondary | ICD-10-CM | POA: Diagnosis not present

## 2022-07-24 DIAGNOSIS — D631 Anemia in chronic kidney disease: Secondary | ICD-10-CM | POA: Diagnosis not present

## 2022-07-24 DIAGNOSIS — N2581 Secondary hyperparathyroidism of renal origin: Secondary | ICD-10-CM | POA: Diagnosis not present

## 2022-07-27 DIAGNOSIS — N186 End stage renal disease: Secondary | ICD-10-CM | POA: Diagnosis not present

## 2022-07-27 DIAGNOSIS — D631 Anemia in chronic kidney disease: Secondary | ICD-10-CM | POA: Diagnosis not present

## 2022-07-27 DIAGNOSIS — M25511 Pain in right shoulder: Secondary | ICD-10-CM | POA: Insufficient documentation

## 2022-07-27 DIAGNOSIS — N2581 Secondary hyperparathyroidism of renal origin: Secondary | ICD-10-CM | POA: Diagnosis not present

## 2022-07-27 DIAGNOSIS — Z7901 Long term (current) use of anticoagulants: Secondary | ICD-10-CM | POA: Diagnosis not present

## 2022-07-27 DIAGNOSIS — D509 Iron deficiency anemia, unspecified: Secondary | ICD-10-CM | POA: Diagnosis not present

## 2022-07-27 NOTE — Assessment & Plan Note (Signed)
Continue to monitor

## 2022-07-27 NOTE — Progress Notes (Unsigned)
Subjective:    Patient ID: Jillian Hayes, female    DOB: [birth year], 77 y.o.   MRN: [id number]  No chief complaint on file.   HPI Discussed the use of AI scribe software for clinical note transcription with the patient, who gave verbal consent to proceed.  History of Present Illness        Patient is a 77 yo female in today for follow up on chronic medical concerns. No recent febrile illness or hospitalizations. Denies CP/palp/SOB/HA/congestion/fevers/GI or GU c/o. Taking meds as prescribed     Past Medical History:  Diagnosis Date  . Advanced care planning/counseling discussion 06/10/2014   05/31/2014 patient presents copy of HCP and Living Will  . Anemia    iron deficiency  . Anxiety   . Arthritis    "in hands"  . Atrial fibrillation (HCC)   . Benign fundic gland polyps of stomach   . Bladder polyps 06/25/2010  . Chronic headaches 06/24/2010  . Complication of anesthesia    "woke up at the end of a cyst removal surgery in 1991"  . Depression 1991   hospitalized  . Diverticulosis   . ESRD (end stage renal disease) (HCC) 11/09/2015   HD on MWF  . External prolapsed hemorrhoids   . History of chicken pox 06/25/2010  . Hypercalcemia 02/18/2014  . Hypertension   . Insomnia 06/24/2010  . Multiple chemical sensitivity syndrome 06/25/2010  . Proteinuria 02/18/2014  . Renal insufficiency 03/26/2011  . Valvular heart disease 04/28/2016    Past Surgical History:  Procedure Laterality Date  . AV FISTULA PLACEMENT Left    x4  . COLONOSCOPY  2014  . cyst on left breast removed Left 1991   benign  . ESOPHAGOGASTRODUODENOSCOPY (EGD) WITH ESOPHAGEAL DILATION  2014  . LAPAROSCOPIC BILATERAL SALPINGO OOPHERECTOMY Bilateral 08/20/2020   Procedure: LAPAROSCOPIC BILATERAL SALPINGO OOPHORECTOMY;  Surgeon: Jerene Bears, MD;  Location[facility] OR;  Service: Gynecology;  Laterality: Bilateral;  . NASAL SEPTUM SURGERY  1986   rhinoplasty  . polyps on bladder removed  1972   benign   . RIGHT HEART CATH N/A 04/10/2021   Procedure: RIGHT HEART CATH;  Surgeon: Laurey Morale, MD;  Location: [facility] INVASIVE CV LAB;  Service: Cardiovascular;  Laterality: N/A;  . TONSILLECTOMY  1962    Family History  Problem Relation Age of Onset  . Breast cancer Mother 58       left breast removed, 2010 lung  . Diabetes Mother   . Nephrolithiasis Father   . Heart attack Father 43       X 3  . Heart disease Father        smoker  . Hypertension Brother   . Allergic rhinitis Brother   . Melanoma Son 37       melanoma on leg removed  . Pancreatic cancer Maternal Grandmother   . Hypertension Paternal Grandmother   . Obesity Paternal Grandmother   . Heart attack Paternal Grandfather 40  . Diabetes Maternal Aunt        x 2  . Diabetes Maternal Uncle        x 3  . Asthma Neg Hx   . Eczema Neg Hx   . Urticaria Neg Hx   . Immunodeficiency Neg Hx   . Angioedema Neg Hx     Social History   Socioeconomic History  . Marital status: Married    Spouse name: Not on file  . Number of children: 2  . Years of  education: Not on file  . Highest education level: Not on file  Occupational History  . Occupation: retired  Tobacco Use  . Smoking status: Never  . Smokeless tobacco: Never  Vaping Use  . Vaping Use: Never used  Substance and Sexual Activity  . Alcohol use: No  . Drug use: No  . Sexual activity: Yes    Partners: Male  Other Topics Concern  . Not on file  Social History Narrative   Married and lives with husband.     Retired   1 son and 1 daughter   No alcohol tobacco or caffeine   Social Determinants of Health   Financial Resource Strain: Low Risk  (01/30/2021)   Overall Financial Resource Strain (CARDIA)   . Difficulty of Paying Living Expenses: Not hard at all  Food Insecurity: No Food Insecurity (07/07/2022)   Hunger Vital Sign   . Worried About Programme researcher, broadcasting/film/video in the Last Year: Never true   . Ran Out of Food in the Last Year: Never true  Transportation  Needs: No Transportation Needs (07/07/2022)   PRAPARE - Transportation   . Lack of Transportation (Medical): No   . Lack of Transportation (Non-Medical): No  Physical Activity: Insufficiently Active (01/30/2021)   Exercise Vital Sign   . Days of Exercise per Week: 7 days   . Minutes of Exercise per Session: 20 min  Stress: No Stress Concern Present (01/30/2021)   [employer] of Occupational Health - Occupational Stress Questionnaire   . Feeling of Stress : Not at all  Social Connections: Moderately Integrated (01/30/2021)   Social Connection and Isolation Panel [NHANES]   . Frequency of Communication with Friends and Family: More than three times a week   . Frequency of Social Gatherings with Friends and Family: More than three times a week   . Attends Religious Services: More than 4 times per year   . Active Member of Clubs or Organizations: No   . Attends Banker Meetings: Never   . Marital Status: Married  Catering manager Violence: Not At Risk (05/05/2022)   Humiliation, Afraid, Rape, and Kick questionnaire   . Fear of Current or Ex-Partner: No   . Emotionally Abused: No   . Physically Abused: No   . Sexually Abused: No    Outpatient Medications Prior to Visit  Medication Sig Dispense Refill  . acetaminophen (TYLENOL) 500 MG tablet Take 500 mg by mouth every 6 (six) hours as needed for headache (pain).    . Alfalfa 500 MG TABS Take 500 mg by mouth 3 (three) times daily.    Marland Kitchen atorvastatin (LIPITOR) 20 MG tablet Take 20 mg by mouth at bedtime.    . carvedilol (COREG) 25 MG tablet Take 1 tablet by mouth twice daily 180 tablet 0  . cinacalcet (SENSIPAR) 30 MG tablet Take 30 mg by mouth at bedtime.    Marland Kitchen diltiazem (CARDIZEM CD) 240 MG 24 hr capsule Take 240 mg by mouth daily with lunch.    . hydrALAZINE (APRESOLINE) 50 MG tablet Take 75 mg by mouth 2 (two) times daily.    . L-Glutamine 500 MG CAPS Take 500 mg by mouth every evening.    . lidocaine 4 % Place 1 patch  onto the skin daily. 10 patch 1  . lidocaine-prilocaine (EMLA) cream Apply 1 application  topically every Monday, Wednesday, and Friday with hemodialysis.    Marland Kitchen OVER THE COUNTER MEDICATION Take 3 capsules by mouth every evening. Shaklee Herb-Lax Natural  Laxative for Colon Cleanse (Senna, Licorice, and Alfalfa)    . Probiotic Product (PROBIOTIC DAILY PO) Take 1 capsule by mouth in the morning.    . sevelamer carbonate (RENVELA) 800 MG tablet Take 2,400 mg by mouth See admin instructions. Take 3 tablets (2400 mg) by mouth with each meal & take 2 tablets (1600 mg) by mouth with each large snack    . vitamin C (ASCORBIC ACID) 500 MG tablet Take 500 mg by mouth daily.    Marland Kitchen warfarin (COUMADIN) 1 MG tablet Take 1 mg by mouth every Tuesday, Thursday, and Saturday at 6 PM.    . warfarin (COUMADIN) 5 MG tablet Take 1 tablet (5 mg total) by mouth daily at 4 PM. ONCE DAILY AS DIRECTED (Patient taking differently: Take 5 mg by mouth every evening.) 30 tablet 0   Facility-Administered Medications Prior to Visit  Medication Dose Route Frequency Provider Last Rate Last Admin  . triamcinolone acetonide (KENALOG-40) injection 40 mg  40 mg Intra-articular Once Myra Rude, MD        Allergies  Allergen Reactions  . Sulfa Antibiotics Nausea And Vomiting  . Clonidine Derivatives Palpitations    Review of Systems  Constitutional:  Negative for fever and malaise/fatigue.  HENT:  Negative for congestion.   Eyes:  Negative for blurred vision.  Respiratory:  Negative for shortness of breath.   Cardiovascular:  Negative for chest pain, palpitations and leg swelling.  Gastrointestinal:  Negative for abdominal pain, blood in stool and nausea.  Genitourinary:  Negative for dysuria and frequency.  Musculoskeletal:  Positive for joint pain. Negative for falls.  Skin:  Negative for rash.  Neurological:  Negative for dizziness, loss of consciousness and headaches.  Endo/Heme/Allergies:  Negative for environmental  allergies.  Psychiatric/Behavioral:  Negative for depression. The patient is not nervous/anxious.       Objective:    Physical Exam Constitutional:      General: She is not in acute distress.    Appearance: Normal appearance. She is well-developed. She is not toxic-appearing.  HENT:     Head: Normocephalic and atraumatic.     Right Ear: External ear normal.     Left Ear: External ear normal.     Nose: Nose normal.  Eyes:     General:        Right eye: No discharge.        Left eye: No discharge.     Conjunctiva/sclera: Conjunctivae normal.  Neck:     Thyroid: No thyromegaly.  Cardiovascular:     Rate and Rhythm: Normal rate and regular rhythm.     Heart sounds: Normal heart sounds. No murmur heard. Pulmonary:     Effort: Pulmonary effort is normal. No respiratory distress.     Breath sounds: Normal breath sounds.  Abdominal:     General: Bowel sounds are normal.     Palpations: Abdomen is soft.     Tenderness: There is no abdominal tenderness. There is no guarding.  Musculoskeletal:        General: Normal range of motion.     Cervical back: Neck supple.  Lymphadenopathy:     Cervical: No cervical adenopathy.  Skin:    General: Skin is warm and dry.  Neurological:     Mental Status: She is alert and oriented to person, place, and time.  Psychiatric:        Mood and Affect: Mood normal.        Behavior: Behavior normal.  Thought Content: Thought content normal.        Judgment: Judgment normal.   There were no vitals taken for this visit. Wt Readings from Last 3 Encounters:  06/30/22 137 lb 12.8 oz (62.5 kg)  06/16/22 134 lb (60.8 kg)  06/03/22 136 lb (61.7 kg)    Diabetic Foot Exam - Simple   No data filed    Lab Results  Component Value Date   WBC 5.1 12/04/2021   HGB 11.5 (L) 12/04/2021   HCT 35.9 (L) 12/04/2021   PLT 219.0 12/04/2021   GLUCOSE 90 12/04/2021   CHOL 157 12/04/2021   TRIG 59.0 12/04/2021   HDL 100.70 12/04/2021   LDLCALC 45  12/04/2021   ALT 20 12/04/2021   AST 26 12/04/2021   NA 138 12/04/2021   K 4.2 12/04/2021   CL 98 12/04/2021   CREATININE 5.34 (HH) 12/04/2021   BUN 43 (H) 12/04/2021   CO2 28 12/04/2021   TSH 0.07 (L) 12/04/2021   INR 1.1 04/10/2021   HGBA1C 5.7 (H) 10/25/2017    Lab Results  Component Value Date   TSH 0.07 (L) 12/04/2021   Lab Results  Component Value Date   WBC 5.1 12/04/2021   HGB 11.5 (L) 12/04/2021   HCT 35.9 (L) 12/04/2021   MCV 97.9 12/04/2021   PLT 219.0 12/04/2021   Lab Results  Component Value Date   NA 138 12/04/2021   K 4.2 12/04/2021   CHLORIDE 109 03/25/2016   CO2 28 12/04/2021   GLUCOSE 90 12/04/2021   BUN 43 (H) 12/04/2021   CREATININE 5.34 (HH) 12/04/2021   BILITOT 0.4 12/04/2021   ALKPHOS 75 12/04/2021   AST 26 12/04/2021   ALT 20 12/04/2021   PROT 6.2 12/04/2021   ALBUMIN 4.2 12/04/2021   CALCIUM 9.5 12/04/2021   ANIONGAP 11 08/15/2020   EGFR 8 (L) 04/01/2021   GFR 7.35 (LL) 12/04/2021   Lab Results  Component Value Date   CHOL 157 12/04/2021   Lab Results  Component Value Date   HDL 100.70 12/04/2021   Lab Results  Component Value Date   LDLCALC 45 12/04/2021   Lab Results  Component Value Date   TRIG 59.0 12/04/2021   Lab Results  Component Value Date   CHOLHDL 2 12/04/2021   Lab Results  Component Value Date   HGBA1C 5.7 (H) 10/25/2017       Assessment & Plan:  Hypertension, unspecified type Assessment & Plan: Well controlled, no changes to meds. Encouraged heart healthy diet such as the DASH diet and exercise as tolerated.    ESRD (end stage renal disease) (HCC) Assessment & Plan: Works with nephrology in [place]   Anemia in ESRD (end-stage renal disease) (HCC) Assessment & Plan: Continues to be monitored and managed with nephrology   Mixed hyperlipidemia Assessment & Plan: Encourage heart healthy diet such as MIND or DASH diet, increase exercise, avoid trans fats, simple carbohydrates and processed foods,  consider a krill or fish or flaxseed oil cap daily.    Paroxysmal atrial fibrillation (HCC) Assessment & Plan: Rate controlled and tolerating Coumadin   Hyperglycemia Assessment & Plan: hgba1c acceptable, minimize simple carbs. Increase exercise as tolerated.    Disorder of vitamin B12 Assessment & Plan: Continue to monitor     Assessment and Plan              Danise Edge, MD

## 2022-07-27 NOTE — Assessment & Plan Note (Signed)
Well controlled, no changes to meds. Encouraged heart healthy diet such as the DASH diet and exercise as tolerated.  °

## 2022-07-27 NOTE — Assessment & Plan Note (Signed)
Rate controlled and tolerating Coumadin 

## 2022-07-27 NOTE — Assessment & Plan Note (Signed)
Works with nephrology in [place] 

## 2022-07-27 NOTE — Assessment & Plan Note (Signed)
Continues to be monitored and managed with nephrology

## 2022-07-27 NOTE — Assessment & Plan Note (Signed)
[id number] acceptable, minimize simple carbs. Increase exercise as tolerated.  

## 2022-07-27 NOTE — Assessment & Plan Note (Signed)
Encouraged moist heat and gentle stretching as tolerated. May try NSAIDs and prescription meds as directed and report if symptoms worsen or seek immediate care 

## 2022-07-27 NOTE — Assessment & Plan Note (Signed)
Encourage heart healthy diet such as MIND or DASH diet, increase exercise, avoid trans fats, simple carbohydrates and processed foods, consider a krill or fish or flaxseed oil cap daily.  °

## 2022-07-28 ENCOUNTER — Telehealth: Payer: Self-pay

## 2022-07-28 ENCOUNTER — Ambulatory Visit (INDEPENDENT_AMBULATORY_CARE_PROVIDER_SITE_OTHER): Payer: Medicare HMO | Admitting: Family Medicine

## 2022-07-28 ENCOUNTER — Ambulatory Visit (HOSPITAL_BASED_OUTPATIENT_CLINIC_OR_DEPARTMENT_OTHER)
Admission: RE | Admit: 2022-07-28 | Discharge: 2022-07-28 | Disposition: A | Discharge status: Home / Self Care | Payer: Medicare HMO | Source: Ambulatory Visit | Attending: Family Medicine | Admitting: Family Medicine

## 2022-07-28 VITALS — BP 132/68 | HR 74 | Temp 98.0°F | Resp 16 | Ht 66.0 in | Wt 133.0 lb

## 2022-07-28 DIAGNOSIS — M810 Age-related osteoporosis without current pathological fracture: Secondary | ICD-10-CM

## 2022-07-28 DIAGNOSIS — M25511 Pain in right shoulder: Secondary | ICD-10-CM | POA: Insufficient documentation

## 2022-07-28 DIAGNOSIS — R946 Abnormal results of thyroid function studies: Secondary | ICD-10-CM | POA: Diagnosis not present

## 2022-07-28 DIAGNOSIS — I10 Essential (primary) hypertension: Secondary | ICD-10-CM | POA: Diagnosis not present

## 2022-07-28 DIAGNOSIS — R3911 Hesitancy of micturition: Secondary | ICD-10-CM | POA: Diagnosis not present

## 2022-07-28 DIAGNOSIS — N186 End stage renal disease: Secondary | ICD-10-CM | POA: Diagnosis not present

## 2022-07-28 DIAGNOSIS — R739 Hyperglycemia, unspecified: Secondary | ICD-10-CM

## 2022-07-28 DIAGNOSIS — I480 Paroxysmal atrial fibrillation: Secondary | ICD-10-CM | POA: Diagnosis not present

## 2022-07-28 DIAGNOSIS — E782 Mixed hyperlipidemia: Secondary | ICD-10-CM | POA: Diagnosis not present

## 2022-07-28 DIAGNOSIS — R0602 Shortness of breath: Secondary | ICD-10-CM | POA: Insufficient documentation

## 2022-07-28 DIAGNOSIS — D631 Anemia in chronic kidney disease: Secondary | ICD-10-CM

## 2022-07-28 DIAGNOSIS — E538 Deficiency of other specified B group vitamins: Secondary | ICD-10-CM

## 2022-07-28 DIAGNOSIS — R7989 Other specified abnormal findings of blood chemistry: Secondary | ICD-10-CM

## 2022-07-28 DIAGNOSIS — R5383 Other fatigue: Secondary | ICD-10-CM | POA: Diagnosis not present

## 2022-07-28 LAB — URINALYSIS, ROUTINE W REFLEX MICROSCOPIC
Bilirubin Urine: NEGATIVE
Hgb urine dipstick: NEGATIVE
Ketones, ur: NEGATIVE
Leukocytes,Ua: NEGATIVE
Nitrite: NEGATIVE
Specific Gravity, Urine: 1.015 (ref 1.000–1.030)
Total Protein, Urine: >=300 — AB
Urine Glucose: NEGATIVE
Urobilinogen, UA: 0.2 (ref 0.0–1.0)
pH: 7.5 (ref 5.0–8.0)

## 2022-07-28 LAB — VITAMIN B12: Vitamin B-12: 1241 pg/mL — ABNORMAL HIGH (ref 211–911)

## 2022-07-28 LAB — TSH: TSH: 0.13 u[IU]/mL — ABNORMAL LOW (ref 0.35–5.50)

## 2022-07-28 LAB — HEMOGLOBIN A1C: Hgb A1c MFr Bld: 4.7 % (ref 4.6–6.5)

## 2022-07-28 LAB — T4, FREE: Free T4: 1.43 ng/dL (ref 0.60–1.60)

## 2022-07-28 MED ORDER — METHYLPREDNISOLONE 4 MG PO TABS
ORAL_TABLET | ORAL | 0 refills | Status: DC
Start: 2022-07-28 — End: 2022-08-07

## 2022-07-28 NOTE — Assessment & Plan Note (Signed)
Check UA and culture 

## 2022-07-28 NOTE — Assessment & Plan Note (Signed)
Encouraged to get adequate exercise, calcium and vitamin d intake 

## 2022-07-28 NOTE — Assessment & Plan Note (Signed)
Check CXR and echo and patient will report worsening symptoms

## 2022-07-28 NOTE — Patient Instructions (Signed)
Shoulder Pain Many things can cause shoulder pain, including: An injury to the shoulder. Overuse of the shoulder. Arthritis. The source of the pain can be: Inflammation. An injury to the shoulder joint. An injury to a tendon, ligament, or bone. Follow these instructions at home: Pay attention to changes in your symptoms. Let your health care provider know about them. Follow these instructions to relieve your pain. If you have a removable sling: Wear the sling as told by your provider. Remove it only as told by your provider. Check the skin around the sling every day. Tell your provider about any concerns. Loosen the sling if your fingers tingle, become numb, or become cold. Keep the sling clean. If the sling is not waterproof: Do not let it get wet. Remove it to shower or bathe. Move your arm as little as possible, but keep your hand moving to prevent swelling. Managing pain, stiffness, and swelling  If told, put ice on the painful area. If you have a removable sling or immobilizer, remove it as told by your provider. Put ice in a plastic bag. Place a towel between your skin and the bag. Leave the ice on for 20 minutes, 2-3 times a day. If your skin turns bright red, remove the ice right away to prevent skin damage. The risk of damage is higher if you cannot feel pain, heat, or cold. Move your fingers often to reduce stiffness and swelling. Squeeze a soft ball or a foam pad as much as possible. This helps to keep the shoulder from swelling. It also helps to strengthen the arm. General instructions Take over-the-counter and prescription medicines only as told by your provider. Exercise may help with pain management. Perform exercises if told by your provider. You may be referred to a physical therapist to help in your recovery process. Keep all follow-up visits in order to avoid any type of permanent shoulder disability or chronic pain problems. Contact a health care provider  if: Your pain is not relieved with medicines. New pain develops in your arm, hand, or fingers. You loosen your sling and your arm, hand, or fingers remain tingly, numb, swollen, or painful. Get help right away if: Your arm, hand, or fingers turn white or blue. This information is not intended to replace advice given to you by your health care provider. Make sure you discuss any questions you have with your health care provider. Document Revised: 08/22/2021 Document Reviewed: 08/22/2021 Elsevier Patient Education  2024 [employer] Inc.  

## 2022-07-28 NOTE — Patient Outreach (Signed)
  Care Coordination   07/28/2022 Name: Jillian Hayes MRN: [id number] DOB: [birth year]   Care Coordination Outreach Attempts:  An unsuccessful telephone outreach was attempted for a scheduled appointment today.  Follow Up Plan:  Additional outreach attempts will be made to offer the patient care coordination information and services.   Encounter Outcome:  No Answer   Care Coordination Interventions:  No, not indicated    Kathyrn Sheriff, RN, MSN, BSN, CCM [facility] Care Coordinator [phone]

## 2022-07-29 ENCOUNTER — Other Ambulatory Visit: Payer: Self-pay

## 2022-07-29 ENCOUNTER — Telehealth: Payer: Self-pay | Admitting: Family Medicine

## 2022-07-29 DIAGNOSIS — D509 Iron deficiency anemia, unspecified: Secondary | ICD-10-CM | POA: Diagnosis not present

## 2022-07-29 DIAGNOSIS — N186 End stage renal disease: Secondary | ICD-10-CM | POA: Diagnosis not present

## 2022-07-29 DIAGNOSIS — D631 Anemia in chronic kidney disease: Secondary | ICD-10-CM | POA: Diagnosis not present

## 2022-07-29 DIAGNOSIS — R7989 Other specified abnormal findings of blood chemistry: Secondary | ICD-10-CM

## 2022-07-29 DIAGNOSIS — N2581 Secondary hyperparathyroidism of renal origin: Secondary | ICD-10-CM | POA: Diagnosis not present

## 2022-07-29 NOTE — Telephone Encounter (Signed)
Pt called back for labs results. 

## 2022-07-30 NOTE — Telephone Encounter (Signed)
Spoke with pt regarding labs

## 2022-07-31 DIAGNOSIS — D509 Iron deficiency anemia, unspecified: Secondary | ICD-10-CM | POA: Diagnosis not present

## 2022-07-31 DIAGNOSIS — N2581 Secondary hyperparathyroidism of renal origin: Secondary | ICD-10-CM | POA: Diagnosis not present

## 2022-07-31 DIAGNOSIS — N186 End stage renal disease: Secondary | ICD-10-CM | POA: Diagnosis not present

## 2022-07-31 DIAGNOSIS — D631 Anemia in chronic kidney disease: Secondary | ICD-10-CM | POA: Diagnosis not present

## 2022-08-02 DIAGNOSIS — N186 End stage renal disease: Secondary | ICD-10-CM | POA: Diagnosis not present

## 2022-08-02 DIAGNOSIS — Z992 Dependence on renal dialysis: Secondary | ICD-10-CM | POA: Diagnosis not present

## 2022-08-03 ENCOUNTER — Other Ambulatory Visit: Payer: Self-pay | Admitting: Family Medicine

## 2022-08-03 ENCOUNTER — Encounter: Payer: Self-pay | Admitting: Family Medicine

## 2022-08-03 ENCOUNTER — Ambulatory Visit (HOSPITAL_BASED_OUTPATIENT_CLINIC_OR_DEPARTMENT_OTHER)
Admission: RE | Admit: 2022-08-03 | Discharge: 2022-08-03 | Disposition: A | Discharge status: Home / Self Care | Payer: Medicare HMO | Source: Ambulatory Visit | Attending: Family Medicine | Admitting: Family Medicine

## 2022-08-03 ENCOUNTER — Ambulatory Visit (INDEPENDENT_AMBULATORY_CARE_PROVIDER_SITE_OTHER): Payer: Medicare HMO | Admitting: Family Medicine

## 2022-08-03 VITALS — HR 66 | Temp 97.7°F | Resp 18 | Ht 66.0 in | Wt 133.6 lb

## 2022-08-03 DIAGNOSIS — Z7901 Long term (current) use of anticoagulants: Secondary | ICD-10-CM | POA: Diagnosis not present

## 2022-08-03 DIAGNOSIS — N186 End stage renal disease: Secondary | ICD-10-CM | POA: Diagnosis not present

## 2022-08-03 DIAGNOSIS — M7989 Other specified soft tissue disorders: Secondary | ICD-10-CM

## 2022-08-03 DIAGNOSIS — D631 Anemia in chronic kidney disease: Secondary | ICD-10-CM | POA: Diagnosis not present

## 2022-08-03 DIAGNOSIS — I82C11 Acute embolism and thrombosis of right internal jugular vein: Secondary | ICD-10-CM

## 2022-08-03 DIAGNOSIS — M79604 Pain in right leg: Secondary | ICD-10-CM | POA: Diagnosis not present

## 2022-08-03 DIAGNOSIS — R791 Abnormal coagulation profile: Secondary | ICD-10-CM | POA: Diagnosis not present

## 2022-08-03 DIAGNOSIS — D509 Iron deficiency anemia, unspecified: Secondary | ICD-10-CM | POA: Diagnosis not present

## 2022-08-03 DIAGNOSIS — N2581 Secondary hyperparathyroidism of renal origin: Secondary | ICD-10-CM | POA: Diagnosis not present

## 2022-08-03 LAB — POCT INR: INR: 4.6 — AB (ref 2.0–3.0)

## 2022-08-03 NOTE — Assessment & Plan Note (Signed)
Hold coumadin today and tomorrow

## 2022-08-03 NOTE — Assessment & Plan Note (Signed)
On coumadin Will check [place]  Warm compresses

## 2022-08-03 NOTE — Progress Notes (Signed)
Established Patient Office Visit  Subjective   Patient ID: Jillian Hayes, female    DOB: April 18, 1945  Age: 77 y.o. MRN: [id number]  Chief Complaint  Patient presents with   Arm Swelling    Right arm, pt states arm feels tight, unable to lift arm, and bruising. Pt states no fall. Pt states she was helping her husband through a door way.     HPI Discussed the use of AI scribe software for clinical note transcription with the patient, who gave verbal consent to proceed.  History of Present Illness   The patient, with a history of dialysis-dependent renal disease and on anticoagulation with Coumadin, presents with sudden onset right arm swelling and bruising. The patient reports a recent increase in INR to 3.31. She was seen last week for a swollen muscle in the right shoulder and was prescribed prednisone, which she started on Thursday. On Friday, she experienced a shooting pain in the right arm after turning a doorknob. On Saturday, she had a minor bump on the right arm when she grabbed her husband to prevent him from falling. The bruising started last night and the swelling has progressively worsened. The patient denies any pain or heat in the area of swelling and bruising. She had dialysis today and her INR was checked, but the results are pending.      Patient Active Problem List   Diagnosis Date Noted   Swelling of upper arm 08/03/2022   Abnormal INR 08/03/2022   SOB (shortness of breath) 07/28/2022   Urinary hesitancy 07/28/2022   Osteoporosis 07/28/2022   Right shoulder pain 07/27/2022   Dialysis AV fistula malfunction (HCC) 06/30/2022   Upper back pain 05/05/2022   Neck pain 05/05/2022   Hyperglycemia 05/05/2022   Disorder of vitamin B12 05/05/2022   Pulmonary hypertension, primary (HCC) 03/09/2021   Low TSH level 03/09/2021   Right knee pain 03/06/2021   Primary osteoarthritis of right knee 02/25/2021   Bilateral carotid artery stenosis 11/21/2020   Adnexal mass    Left  foot drop 02/08/2020   A-V fistula (HCC) 08/03/2019   Nonrheumatic mitral valve regurgitation 06/12/2019   Edema 04/09/2019   Drug-induced constipation 10/18/2018   External prolapsed hemorrhoids 09/25/2018   Malignant hypertension 04/20/2018   Hyperphosphatemia 03/09/2018   Leg swelling 03/03/2018   Long term (current) use of anticoagulants 11/01/2017   Atrial fibrillation (HCC) 10/25/2017   Hyperparathyroidism, unspecified (HCC) 05/04/2017   DDD (degenerative disc disease), lumbar 04/07/2017   Hearing loss 01/04/2017   Hyperlipidemia 01/04/2017   Arthritis of fingers of both hands 10/01/2016   Chronic rhinitis 10/01/2016   Nonrheumatic aortic valve insufficiency 09/29/2016   Bruit 09/29/2016   Palpitation 09/22/2016   Aortic valve disease 04/28/2016   Anemia in ESRD (end-stage renal disease) (HCC) 03/03/2016   Sun-damaged skin 01/21/2016   ESRD (end stage renal disease) (HCC) 11/09/2015   Chronic kidney disease, stage 5 (HCC) 07/03/2015   Advanced care planning/counseling discussion 06/10/2014   Pedal edema 03/28/2014   Proteinuria 02/18/2014   Hypercalcemia 02/18/2014   Abnormal EKG 01/22/2014   HTN (hypertension) 01/22/2014   Dermatitis 06/30/2013   Abnormal thyroid function test 06/25/2013   Cervical cancer screening 06/10/2012   Preventative health care 11/09/2011   Multiple chemical sensitivity syndrome 06/25/2010   History of chicken pox 06/25/2010   Bladder polyps 06/25/2010   History of cardiac dysrhythmia 06/25/2010   Insomnia 06/24/2010   Fatigue 06/24/2010   Chronic headaches 06/24/2010   Allergy  Anemia    Depression    Past Medical History:  Diagnosis Date   Advanced care planning/counseling discussion 06/10/2014   05/31/2014 patient presents copy of HCP and Living Will   Anemia    iron deficiency   Anxiety    Arthritis    "in hands"   Atrial fibrillation (HCC)    Benign fundic gland polyps of stomach    Bladder polyps 06/25/2010   Chronic  headaches 06/24/2010   Complication of anesthesia    "woke up at the end of a cyst removal surgery in 1991"   Depression 1991   hospitalized   Diverticulosis    ESRD (end stage renal disease) (HCC) 11/09/2015   HD on MWF   External prolapsed hemorrhoids    History of chicken pox 06/25/2010   Hypercalcemia 02/18/2014   Hypertension    Insomnia 06/24/2010   Multiple chemical sensitivity syndrome 06/25/2010   Proteinuria 02/18/2014   Renal insufficiency 03/26/2011   Valvular heart disease 04/28/2016   Past Surgical History:  Procedure Laterality Date   AV FISTULA PLACEMENT Left    x4   COLONOSCOPY  2014   cyst on left breast removed Left 1991   benign   ESOPHAGOGASTRODUODENOSCOPY (EGD) WITH ESOPHAGEAL DILATION  2014   LAPAROSCOPIC BILATERAL SALPINGO OOPHERECTOMY Bilateral 08/20/2020   Procedure: LAPAROSCOPIC BILATERAL SALPINGO OOPHORECTOMY;  Surgeon: Jerene Bears, MD;  Location: [facility] OR;  Service: Gynecology;  Laterality: Bilateral;   NASAL SEPTUM SURGERY  1986   rhinoplasty   polyps on bladder removed  1972   benign   RIGHT HEART CATH N/A 04/10/2021   Procedure: RIGHT HEART CATH;  Surgeon: Laurey Morale, MD;  Location: [facility] INVASIVE CV LAB;  Service: Cardiovascular;  Laterality: N/A;   TONSILLECTOMY  1962   Social History   Tobacco Use   Smoking status: Never   Smokeless tobacco: Never  Vaping Use   Vaping Use: Never used  Substance Use Topics   Alcohol use: No   Drug use: No   Social History   Socioeconomic History   Marital status: Married    Spouse name: Not on file   Number of children: 2   Years of education: Not on file   Highest education level: Not on file  Occupational History   Occupation: retired  Tobacco Use   Smoking status: Never   Smokeless tobacco: Never  Vaping Use   Vaping Use: Never used  Substance and Sexual Activity   Alcohol use: No   Drug use: No   Sexual activity: Yes    Partners: Male  Other Topics Concern   Not on file  Social  History Narrative   Married and lives with husband.     Retired   1 son and 1 daughter   No alcohol tobacco or caffeine   Social Determinants of Health   Financial Resource Strain: Low Risk  (01/30/2021)   Overall Financial Resource Strain (CARDIA)    Difficulty of Paying Living Expenses: Not hard at all  Food Insecurity: No Food Insecurity (07/07/2022)   Hunger Vital Sign    Worried About Running Out of Food in the Last Year: Never true    Ran Out of Food in the Last Year: Never true  Transportation Needs: No Transportation Needs (07/07/2022)   PRAPARE - Administrator, Civil Service (Medical): No    Lack of Transportation (Non-Medical): No  Physical Activity: Insufficiently Active (01/30/2021)   Exercise Vital Sign    Days of Exercise  per Week: 7 days    Minutes of Exercise per Session: 20 min  Stress: No Stress Concern Present (01/30/2021)   [employer] of Occupational Health - Occupational Stress Questionnaire    Feeling of Stress : Not at all  Social Connections: Moderately Integrated (01/30/2021)   Social Connection and Isolation Panel [NHANES]    Frequency of Communication with Friends and Family: More than three times a week    Frequency of Social Gatherings with Friends and Family: More than three times a week    Attends Religious Services: More than 4 times per year    Active Member of Clubs or Organizations: No    Attends Banker Meetings: Never    Marital Status: Married  Catering manager Violence: Not At Risk (05/05/2022)   Humiliation, Afraid, Rape, and Kick questionnaire    Fear of Current or Ex-Partner: No    Emotionally Abused: No    Physically Abused: No    Sexually Abused: No   Family Status  Relation Name Status   Mother Zula Deceased       35   Father  Deceased at age 68   Brother Qatar Alive       77   Son Marcy Salvo Alive       37/ healthy   MGM  Deceased at age 51's   PGM  Deceased at age 42's   Daughter Grover Canavan Alive        41/ healhty   MGF  Deceased at age 66's   PGF  Deceased at age 71's   Brother Loraine Leriche Alive       48/ healthy   Mat Aunt  (Not Specified)   Nurse, mental health  (Not Specified)   Neg Hx  (Not Specified)   Family History  Problem Relation Age of Onset   Breast cancer Mother 60       left breast removed, 2010 lung   Diabetes Mother    Nephrolithiasis Father    Heart attack Father 52       X 3   Heart disease Father        smoker   Hypertension Brother    Allergic rhinitis Brother    Melanoma Son 37       melanoma on leg removed   Pancreatic cancer Maternal Grandmother    Hypertension Paternal Grandmother    Obesity Paternal Grandmother    Heart attack Paternal Grandfather 40   Diabetes Maternal Aunt        x 2   Diabetes Maternal Uncle        x 3   Asthma Neg Hx    Eczema Neg Hx    Urticaria Neg Hx    Immunodeficiency Neg Hx    Angioedema Neg Hx    Allergies  Allergen Reactions   Sulfa Antibiotics Nausea And Vomiting   Clonidine Derivatives Palpitations      Review of Systems  Constitutional:  Negative for fever and malaise/fatigue.  HENT:  Negative for congestion.   Eyes:  Negative for blurred vision.  Respiratory:  Negative for shortness of breath.   Cardiovascular:  Negative for chest pain, palpitations and leg swelling.  Gastrointestinal:  Negative for abdominal pain, blood in stool and nausea.  Genitourinary:  Negative for dysuria and frequency.  Musculoskeletal:  Negative for falls.  Skin:  Negative for rash.  Neurological:  Negative for dizziness, loss of consciousness and headaches.  Endo/Heme/Allergies:  Negative for environmental allergies.  Psychiatric/Behavioral:  Negative for depression.  The patient is not nervous/anxious.       Objective:     Pulse 66   Temp 97.7 F (36.5 C) (Oral)   Resp 18   Ht 5\' 6"  (1.676 m)   Wt 133 lb 9.6 oz (60.6 kg)   SpO2 98%   BMI 21.56 kg/m  BP Readings from Last 3 Encounters:  07/28/22 132/68  06/30/22 (!)  133/46  06/16/22 (!) 193/67   Wt Readings from Last 3 Encounters:  08/03/22 133 lb 9.6 oz (60.6 kg)  07/28/22 133 lb (60.3 kg)  06/30/22 137 lb 12.8 oz (62.5 kg)   SpO2 Readings from Last 3 Encounters:  08/03/22 98%  07/28/22 95%  06/30/22 98%      Physical Exam Vitals and nursing note reviewed.  Constitutional:      General: She is not in acute distress.    Appearance: Normal appearance. She is well-developed.  HENT:     Head: Normocephalic and atraumatic.     Nose: Nose normal.  Eyes:     General: No scleral icterus.       Right eye: No discharge.        Left eye: No discharge.  Cardiovascular:     Rate and Rhythm: Normal rate and regular rhythm.     Heart sounds: No murmur heard. Pulmonary:     Effort: Pulmonary effort is normal. No respiratory distress.     Breath sounds: Normal breath sounds.  Musculoskeletal:        General: Swelling and tenderness present. Normal range of motion.     Cervical back: Normal range of motion and neck supple.     Right lower leg: No edema.     Left lower leg: No edema.  Skin:    General: Skin is warm and dry.     Findings: Bruising present.  Neurological:     General: No focal deficit present.     Mental Status: She is alert and oriented to person, place, and time.  Psychiatric:        Mood and Affect: Mood normal.        Behavior: Behavior normal.        Thought Content: Thought content normal.        Judgment: Judgment normal.      Results for orders placed or performed in visit on 08/03/22  POCT INR  Result Value Ref Range   INR 4.6 (A) 2.0 - 3.0   POC INR      Last CBC Lab Results  Component Value Date   WBC 5.1 12/04/2021   HGB 11.5 (L) 12/04/2021   HCT 35.9 (L) 12/04/2021   MCV 97.9 12/04/2021   MCH 31.9 04/01/2021   RDW 16.5 (H) 12/04/2021   PLT 219.0 12/04/2021   Last metabolic panel Lab Results  Component Value Date   GLUCOSE 90 12/04/2021   NA 138 12/04/2021   K 4.2 12/04/2021   CL 98 12/04/2021    CO2 28 12/04/2021   BUN 43 (H) 12/04/2021   CREATININE 5.34 (HH) 12/04/2021   GFRNONAA 8 (L) 08/15/2020   CALCIUM 9.5 12/04/2021   PHOS 3.2 04/01/2012   PROT 6.2 12/04/2021   ALBUMIN 4.2 12/04/2021   BILITOT 0.4 12/04/2021   ALKPHOS 75 12/04/2021   AST 26 12/04/2021   ALT 20 12/04/2021   ANIONGAP 11 08/15/2020   Last lipids Lab Results  Component Value Date   CHOL 157 12/04/2021   HDL 100.70 12/04/2021   LDLCALC 45 12/04/2021  TRIG 59.0 12/04/2021   CHOLHDL 2 12/04/2021   Last hemoglobin A1c Lab Results  Component Value Date   HGBA1C 4.7 07/28/2022   Last thyroid functions Lab Results  Component Value Date   TSH 0.13 (L) 07/28/2022   Last vitamin D Lab Results  Component Value Date   VD25OH 37.22 02/15/2014   Last vitamin B12 and Folate Lab Results  Component Value Date   VITAMINB12 1,241 (H) 07/28/2022   FOLATE >24.2 09/09/2021      The ASCVD Risk score (Arnett DK, et al., 2019) failed to calculate for the following reasons:   The valid HDL cholesterol range is 20 to 100 mg/dL    Assessment & Plan:   Problem List Items Addressed This Visit       Unprioritized   Swelling of upper arm - Primary    On coumadin Will check [place]  Warm compresses       Relevant Orders   US Venous Img Upper Uni Right(DVT)   Long term (current) use of anticoagulants   Abnormal INR    Hold coumadin today and tomorrow       Relevant Orders   POCT INR (Completed)  Assessment and Plan    Right Upper Extremity Swelling and Bruising: Sudden onset of swelling and bruising in the right upper extremity after minor trauma. Patient is on Coumadin with a recent INR of 3.31. Concern for hematoma or possible deep vein thrombosis. -Check INR today in office. -Order an ultrasound of the right upper extremity to evaluate for deep vein thrombosis. -Adjust Coumadin dose based on INR results and clinical findings.  Chronic Kidney Disease on Hemodialysis: Patient had dialysis today  and reports that her nephrologist recommended a CT scan. -Communicate with nephrologist regarding the findings and management plan.        No follow-ups on file.    Donato Schultz, DO

## 2022-08-04 ENCOUNTER — Other Ambulatory Visit: Payer: Self-pay | Admitting: *Deleted

## 2022-08-04 ENCOUNTER — Other Ambulatory Visit: Payer: Self-pay | Admitting: Family Medicine

## 2022-08-04 ENCOUNTER — Telehealth: Payer: Self-pay | Admitting: *Deleted

## 2022-08-04 DIAGNOSIS — M25511 Pain in right shoulder: Secondary | ICD-10-CM

## 2022-08-04 DIAGNOSIS — I82C11 Acute embolism and thrombosis of right internal jugular vein: Secondary | ICD-10-CM

## 2022-08-04 NOTE — Progress Notes (Signed)
  Care Coordination Note  08/04/2022 Name: Jillian Hayes MRN: [id number] DOB: [birth year]  Jillian Hayes is a 77 y.o. year old female who is a primary care patient of Bradd Canary, MD and is actively engaged with the care management team. I reached out to Cyndia Skeeters by phone today to assist with re-scheduling a follow up visit with the RN Case Manager  Follow up plan: Telephone appointment with care management team member scheduled for: 08/20/2022  Burman Nieves, [facility] Care Coordination Care Guide Direct Dial: [phone]

## 2022-08-04 NOTE — Progress Notes (Deleted)
  Care Coordination Note  08/04/2022 Name: Jillian Hayes MRN: [id number] DOB: [birth year]  Jillian Hayes is a 77 y.o. year old female who is a primary care patient of Bradd Canary, MD and is actively engaged with the care management team. I reached out to Cyndia Skeeters by phone today to assist with re-scheduling a follow up visit with the RN Case Manager  Follow up plan: Unsuccessful telephone outreach attempt made. A HIPAA compliant phone message was left for the patient providing contact information and requesting a return call.   Burman Nieves, CCMA Care Coordination Care Guide Direct Dial: [phone]

## 2022-08-05 DIAGNOSIS — D631 Anemia in chronic kidney disease: Secondary | ICD-10-CM | POA: Diagnosis not present

## 2022-08-05 DIAGNOSIS — N2581 Secondary hyperparathyroidism of renal origin: Secondary | ICD-10-CM | POA: Diagnosis not present

## 2022-08-05 DIAGNOSIS — N186 End stage renal disease: Secondary | ICD-10-CM | POA: Diagnosis not present

## 2022-08-05 DIAGNOSIS — D509 Iron deficiency anemia, unspecified: Secondary | ICD-10-CM | POA: Diagnosis not present

## 2022-08-07 ENCOUNTER — Other Ambulatory Visit: Payer: Self-pay

## 2022-08-07 ENCOUNTER — Inpatient Hospital Stay: Payer: Medicare HMO | Attending: Hematology & Oncology

## 2022-08-07 ENCOUNTER — Inpatient Hospital Stay (HOSPITAL_BASED_OUTPATIENT_CLINIC_OR_DEPARTMENT_OTHER): Payer: Medicare HMO | Admitting: Hematology & Oncology

## 2022-08-07 ENCOUNTER — Encounter: Payer: Self-pay | Admitting: Hematology & Oncology

## 2022-08-07 VITALS — HR 66 | Temp 99.2°F | Resp 18 | Ht 66.0 in | Wt 136.0 lb

## 2022-08-07 DIAGNOSIS — N186 End stage renal disease: Secondary | ICD-10-CM | POA: Insufficient documentation

## 2022-08-07 DIAGNOSIS — Z992 Dependence on renal dialysis: Secondary | ICD-10-CM

## 2022-08-07 DIAGNOSIS — I4891 Unspecified atrial fibrillation: Secondary | ICD-10-CM

## 2022-08-07 DIAGNOSIS — Z7901 Long term (current) use of anticoagulants: Secondary | ICD-10-CM | POA: Insufficient documentation

## 2022-08-07 DIAGNOSIS — I82C11 Acute embolism and thrombosis of right internal jugular vein: Secondary | ICD-10-CM | POA: Diagnosis not present

## 2022-08-07 DIAGNOSIS — I82A11 Acute embolism and thrombosis of right axillary vein: Secondary | ICD-10-CM

## 2022-08-07 HISTORY — DX: Acute embolism and thrombosis of right axillary vein: I82.A11

## 2022-08-07 LAB — CMP (CANCER CENTER ONLY)
ALT: 11 U/L (ref 0–44)
AST: 19 U/L (ref 15–41)
Albumin: 3.8 g/dL (ref 3.5–5.0)
Alkaline Phosphatase: 68 U/L (ref 38–126)
Anion gap: 11 (ref 5–15)
BUN: 57 mg/dL — ABNORMAL HIGH (ref 8–23)
CO2: 26 mmol/L (ref 22–32)
Calcium: 9.4 mg/dL (ref 8.9–10.3)
Chloride: 102 mmol/L (ref 98–111)
Creatinine: 6.75 mg/dL — ABNORMAL HIGH (ref 0.44–1.00)
GFR, Estimated: 6 mL/min — ABNORMAL LOW (ref >60)
Glucose, Bld: 126 mg/dL — ABNORMAL HIGH (ref 70–99)
Potassium: 4.9 mmol/L (ref 3.5–5.1)
Sodium: 139 mmol/L (ref 135–145)
Total Bilirubin: 0.6 mg/dL (ref 0.3–1.2)
Total Protein: 5.8 g/dL — ABNORMAL LOW (ref 6.5–8.1)

## 2022-08-07 LAB — CBC WITH DIFFERENTIAL (CANCER CENTER ONLY)
Abs Immature Granulocytes: 0.02 10*3/uL (ref 0.00–0.07)
Basophils Absolute: 0 10*3/uL (ref 0.0–0.1)
Basophils Relative: 1 %
Eosinophils Absolute: 0.1 10*3/uL (ref 0.0–0.5)
Eosinophils Relative: 2 %
HCT: 30.1 % — ABNORMAL LOW (ref 36.0–46.0)
Hemoglobin: 9.3 g/dL — ABNORMAL LOW (ref 12.0–15.0)
Immature Granulocytes: 0 %
Lymphocytes Relative: 17 %
Lymphs Abs: 1.1 10*3/uL (ref 0.7–4.0)
MCH: 32.9 pg (ref 26.0–34.0)
MCHC: 30.9 g/dL (ref 30.0–36.0)
MCV: 106.4 fL — ABNORMAL HIGH (ref 80.0–100.0)
Monocytes Absolute: 1 10*3/uL (ref 0.1–1.0)
Monocytes Relative: 15 %
Neutro Abs: 4.4 10*3/uL (ref 1.7–7.7)
Neutrophils Relative %: 65 %
Platelet Count: 207 10*3/uL (ref 150–400)
RBC: 2.83 MIL/uL — ABNORMAL LOW (ref 3.87–5.11)
RDW: 15.6 % — ABNORMAL HIGH (ref 11.5–15.5)
WBC Count: 6.6 10*3/uL (ref 4.0–10.5)
nRBC: 0 % (ref 0.0–0.2)

## 2022-08-07 LAB — PROTIME-INR
INR: 1.2 (ref 0.8–1.2)
Prothrombin Time: 15.4 seconds — ABNORMAL HIGH (ref 11.4–15.2)

## 2022-08-07 LAB — LACTATE DEHYDROGENASE: LDH: 207 U/L — ABNORMAL HIGH (ref 98–192)

## 2022-08-07 MED ORDER — APIXABAN 5 MG PO TABS: 5.0000 mg | ORAL_TABLET | Freq: Two times a day (BID) | ORAL | 6 refills | Status: DC

## 2022-08-07 NOTE — Progress Notes (Signed)
Referral MD  Reason for Referral: Acute thrombus in right jugular vein  Chief Complaint  Patient presents with   New Patient (Initial Visit)  : I now have a blood clot.  HPI: Jillian Hayes is a very charming 77 year old white female.  I truly enjoyed talking with her.  She is incredibly eloquent.  She has quite a few health issues.  The biggest health issue clearly is her renal failure.  She been on dialysis now for I think about 3-4 years.  She has been on Coumadin because of atrial fibrillation.  She is on Coumadin now for about 5 years.  She never has had a blood clot.  She had a AV fistula placed to her left arm.  However this has not been able to be used yet.  She subsequently had a dialysis catheter placed into the right internal jugular vein.  This was back in May.  She is on Coumadin.  She has her INR monitored by the dialysis unit.  Apparently she woke up earlier this week with a lot of pain and swelling in the right neck.  She saw her family doctor-Dr. Rogelia Rohrer.  She subsequently had a Doppler done.  Doppler was done on 08/03/2022.  This showed a hematoma in the upper arm.  However, she also had a DVT in the jugular vein on the right.  This extended down to the jugular dialysis catheter.  At that time, she apparently had an INR of 4.1.  She has quite a bit of swelling in the right arm.  She has a lot of ecchymoses in the right arm.  The hematoma measured 6 x 2.7 x 2.3 cm.  She says there is no history in the family of any kind of blood clots.  I do believe that this thrombus was incited by the dialysis catheter.  Jillian Hayes says that the dialysis catheter is going to be removed soon soon as the AV fistula can be used.  She has had no obvious bleeding elsewhere.  She has had no fever.  There is been no cough or chest wall pain.  There is no shortness of breath.  She has not noted any leg swelling.  Overall, I would Say that her performance status right now is probably ECOG  2.    Past Medical History:  Diagnosis Date   Advanced care planning/counseling discussion 06/10/2014   05/31/2014 patient presents copy of HCP and Living Will   Anemia    iron deficiency   Anxiety    Arthritis    "in hands"   Atrial fibrillation (HCC)    Benign fundic gland polyps of stomach    Bladder polyps 06/25/2010   Chronic headaches 06/24/2010   Complication of anesthesia    "woke up at the end of a cyst removal surgery in 1991"   Depression 1991   hospitalized   Diverticulosis    DVT of right axillary vein, acute (HCC) 08/07/2022   ESRD (end stage renal disease) (HCC) 11/09/2015   HD on MWF   External prolapsed hemorrhoids    History of chicken pox 06/25/2010   Hypercalcemia 02/18/2014   Hypertension    Insomnia 06/24/2010   Multiple chemical sensitivity syndrome 06/25/2010   Proteinuria 02/18/2014   Renal insufficiency 03/26/2011   Valvular heart disease 04/28/2016  :   Past Surgical History:  Procedure Laterality Date   AV FISTULA PLACEMENT Left    x4   COLONOSCOPY  2014   cyst on left breast removed Left  1991   benign   ESOPHAGOGASTRODUODENOSCOPY (EGD) WITH ESOPHAGEAL DILATION  2014   LAPAROSCOPIC BILATERAL SALPINGO OOPHERECTOMY Bilateral 08/20/2020   Procedure: LAPAROSCOPIC BILATERAL SALPINGO OOPHORECTOMY;  Surgeon: Jerene Bears, MD;  Location: [facility] OR;  Service: Gynecology;  Laterality: Bilateral;   NASAL SEPTUM SURGERY  1986   rhinoplasty   polyps on bladder removed  1972   benign   RIGHT HEART CATH N/A 04/10/2021   Procedure: RIGHT HEART CATH;  Surgeon: Laurey Morale, MD;  Location: [facility] INVASIVE CV LAB;  Service: Cardiovascular;  Laterality: N/A;   TONSILLECTOMY  1962  :   Current Outpatient Medications:    apixaban (ELIQUIS) 5 MG TABS tablet, Take 1 tablet (5 mg total) by mouth 2 (two) times daily., Disp: 60 tablet, Rfl: 6   Misc Natural Products (SAMBUCUS ELDERBERRY IMMUNE) CHEW, Chew 1 capsule by mouth daily., Disp: , Rfl:    acetaminophen  (TYLENOL) 500 MG tablet, Take 500 mg by mouth every 6 (six) hours as needed for headache (pain)., Disp: , Rfl:    Alfalfa 500 MG TABS, Take 500 mg by mouth 3 (three) times daily., Disp: , Rfl:    atorvastatin (LIPITOR) 20 MG tablet, Take 20 mg by mouth at bedtime., Disp: , Rfl:    carvedilol (COREG) 25 MG tablet, Take 1 tablet by mouth twice daily, Disp: 180 tablet, Rfl: 0   cinacalcet (SENSIPAR) 30 MG tablet, Take 30 mg by mouth at bedtime., Disp: , Rfl:    diltiazem (CARDIZEM CD) 240 MG 24 hr capsule, Take 240 mg by mouth daily with lunch., Disp: , Rfl:    hydrALAZINE (APRESOLINE) 50 MG tablet, Take 75 mg by mouth 2 (two) times daily., Disp: , Rfl:    L-Glutamine 500 MG CAPS, Take 500 mg by mouth every evening., Disp: , Rfl:    lidocaine 4 %, Place 1 patch onto the skin daily., Disp: 10 patch, Rfl: 1   lidocaine-prilocaine (EMLA) cream, Apply 1 application  topically every Monday, Wednesday, and Friday with hemodialysis., Disp: , Rfl:    OVER THE COUNTER MEDICATION, Take 3 capsules by mouth every evening. Shaklee Herb-Lax Natural Laxative for Colon Cleanse (Senna, Licorice, and Alfalfa), Disp: , Rfl:    sevelamer carbonate (RENVELA) 800 MG tablet, Take 2,400 mg by mouth See admin instructions. Take 3 tablets (2400 mg) by mouth with each meal & take 2 tablets (1600 mg) by mouth with each large snack, Disp: , Rfl:    vitamin C (ASCORBIC ACID) 500 MG tablet, Take 500 mg by mouth daily., Disp: , Rfl:   Current Facility-Administered Medications:    triamcinolone acetonide (KENALOG-40) injection 40 mg, 40 mg, Intra-articular, Once, Myra Rude, MD:   triamcinolone acetonide  40 mg Intra-articular Once  :   Allergies  Allergen Reactions   Sulfa Antibiotics Nausea And Vomiting   Clonidine Derivatives Palpitations  :   Family History  Problem Relation Age of Onset   Breast cancer Mother 42       left breast removed, 2010 lung   Diabetes Mother    Nephrolithiasis Father    Heart  attack Father 30       X 3   Heart disease Father        smoker   Hypertension Brother    Allergic rhinitis Brother    Melanoma Son 37       melanoma on leg removed   Pancreatic cancer Maternal Grandmother    Hypertension Paternal Grandmother    Obesity Paternal Grandmother  Heart attack Paternal Grandfather 40   Diabetes Maternal Aunt        x 2   Diabetes Maternal Uncle        x 3   Asthma Neg Hx    Eczema Neg Hx    Urticaria Neg Hx    Immunodeficiency Neg Hx    Angioedema Neg Hx   :   Social History   Socioeconomic History   Marital status: Married    Spouse name: Not on file   Number of children: 2   Years of education: Not on file   Highest education level: Not on file  Occupational History   Occupation: retired  Tobacco Use   Smoking status: Never   Smokeless tobacco: Never  Vaping Use   Vaping Use: Never used  Substance and Sexual Activity   Alcohol use: No   Drug use: No   Sexual activity: Yes    Partners: Male  Other Topics Concern   Not on file  Social History Narrative   Married and lives with husband.     Retired   1 son and 1 daughter   No alcohol tobacco or caffeine   Social Determinants of Health   Financial Resource Strain: Low Risk  (01/30/2021)   Overall Financial Resource Strain (CARDIA)    Difficulty of Paying Living Expenses: Not hard at all  Food Insecurity: No Food Insecurity (07/07/2022)   Hunger Vital Sign    Worried About Running Out of Food in the Last Year: Never true    Ran Out of Food in the Last Year: Never true  Transportation Needs: No Transportation Needs (07/07/2022)   PRAPARE - Administrator, Civil Service (Medical): No    Lack of Transportation (Non-Medical): No  Physical Activity: Insufficiently Active (01/30/2021)   Exercise Vital Sign    Days of Exercise per Week: 7 days    Minutes of Exercise per Session: 20 min  Stress: No Stress Concern Present (01/30/2021)   [employer] of Occupational  Health - Occupational Stress Questionnaire    Feeling of Stress : Not at all  Social Connections: Moderately Integrated (01/30/2021)   Social Connection and Isolation Panel [NHANES]    Frequency of Communication with Friends and Family: More than three times a week    Frequency of Social Gatherings with Friends and Family: More than three times a week    Attends Religious Services: More than 4 times per year    Active Member of [employer] or Organizations: No    Attends Banker Meetings: Never    Marital Status: Married  Catering manager Violence: Not At Risk (05/05/2022)   Humiliation, Afraid, Rape, and Kick questionnaire    Fear of Current or Ex-Partner: No    Emotionally Abused: No    Physically Abused: No    Sexually Abused: No  :  Review of Systems  Constitutional: Negative.   HENT: Negative.    Eyes: Negative.   Respiratory: Negative.    Cardiovascular: Negative.   Gastrointestinal: Negative.   Genitourinary: Negative.   Musculoskeletal: Negative.   Skin: Negative.   Neurological: Negative.   Endo/Heme/Allergies:  Bruises/bleeds easily.  Psychiatric/Behavioral: Negative.       Exam: Vital signs are temperature 99.2.  Pulse 66.  Blood pressure was not taken.  Her weight was 136 pounds.  @IPVITALS @ Physical Exam Vitals reviewed.  HENT:     Head: Normocephalic and atraumatic.  Eyes:     Pupils: Pupils are equal, round,  and reactive to light.  Cardiovascular:     Rate and Rhythm: Normal rate and regular rhythm.     Heart sounds: Normal heart sounds.  Pulmonary:     Effort: Pulmonary effort is normal.     Breath sounds: Normal breath sounds.  Abdominal:     General: Bowel sounds are normal.     Palpations: Abdomen is soft.  Musculoskeletal:        General: No tenderness or deformity. Normal range of motion.     Cervical back: Normal range of motion.     Comments: Extremities does show some swelling in the right arm.  She has decent range of motion.  She  has good pulses in the distal extremities.  She has extensive ecchymoses on the right arm.  She does have a little bit of fullness in the upper aspect.  She has an AV fistula in the left upper arm.  Lymphadenopathy:     Cervical: No cervical adenopathy.  Skin:    General: Skin is warm and dry.     Findings: No erythema or rash.  Neurological:     Mental Status: She is alert and oriented to person, place, and time.  Psychiatric:        Behavior: Behavior normal.        Thought Content: Thought content normal.        Judgment: Judgment normal.     Recent Labs    08/07/22 1327  WBC 6.6  HGB 9.3*  HCT 30.1*  PLT 207    Recent Labs    08/07/22 1327  NA 139  K 4.9  CL 102  CO2 26  GLUCOSE 126*  BUN 57*  CREATININE 6.75*  CALCIUM 9.4    Blood smear review: None  Pathology: None    Assessment and Plan: Jillian Hayes is a very nice 77 year old white female.  This is a very interesting problem.  She developed a thrombus in the right internal jugular vein.  Again I suspect the thrombus is secondary to this dialysis catheter.  She also has a hematoma in the right arm.  Her INR was on the high side.  Today, her INR is 1.2.  I think we can probably have to hold on starting anticoagulation on her probably till we try to get this hematoma little bit smaller.  We probably would wait about 3 to 4 days before I would start Eliquis.  I would not give her a loading dose of Eliquis 10 mg twice a day.  I would just go ahead and start her on the 5 mg twice daily dose.  I suspect that the thrombus should improve once we get the dialysis catheter out of her.  However, I would not take out the dialysis catheter until she is on Eliquis.  I think you will take months before she started to see improvement in the right arm.  She has extensive ecchymoses which I just do not think will improve as long as she is on anticoagulation for several months.  Unfortunately, I think we are going to have  to get her on anticoagulation as I would worry that this thrombus will keep propagating downward.  I suspect that she is going to need lifelong anticoagulation because of the atrial fibrillation.  I told her that this is a complication of anticoagulation.  We see it on occasion.  It can be quite unsettling.  I would like to have her come back to see [place] in about 3  weeks to see how she is feeling.  I probably would not do a Doppler for at least 6-8 weeks so that we can see how everything looks.  I told her that the dialysis unit can always give [place] a call if have any questions about her anticoagulation.

## 2022-08-08 DIAGNOSIS — D509 Iron deficiency anemia, unspecified: Secondary | ICD-10-CM | POA: Diagnosis not present

## 2022-08-08 DIAGNOSIS — D631 Anemia in chronic kidney disease: Secondary | ICD-10-CM | POA: Diagnosis not present

## 2022-08-08 DIAGNOSIS — N2581 Secondary hyperparathyroidism of renal origin: Secondary | ICD-10-CM | POA: Diagnosis not present

## 2022-08-08 DIAGNOSIS — N186 End stage renal disease: Secondary | ICD-10-CM | POA: Diagnosis not present

## 2022-08-10 DIAGNOSIS — D509 Iron deficiency anemia, unspecified: Secondary | ICD-10-CM | POA: Diagnosis not present

## 2022-08-10 DIAGNOSIS — Z7901 Long term (current) use of anticoagulants: Secondary | ICD-10-CM | POA: Diagnosis not present

## 2022-08-10 DIAGNOSIS — N2581 Secondary hyperparathyroidism of renal origin: Secondary | ICD-10-CM | POA: Diagnosis not present

## 2022-08-10 DIAGNOSIS — N186 End stage renal disease: Secondary | ICD-10-CM | POA: Diagnosis not present

## 2022-08-10 DIAGNOSIS — D631 Anemia in chronic kidney disease: Secondary | ICD-10-CM | POA: Diagnosis not present

## 2022-08-11 ENCOUNTER — Encounter: Payer: Self-pay | Admitting: Family Medicine

## 2022-08-11 ENCOUNTER — Ambulatory Visit (INDEPENDENT_AMBULATORY_CARE_PROVIDER_SITE_OTHER): Payer: Medicare HMO | Admitting: Family Medicine

## 2022-08-11 VITALS — BP 166/74 | HR 72 | Temp 97.9°F | Ht 66.0 in | Wt 131.0 lb

## 2022-08-11 DIAGNOSIS — R21 Rash and other nonspecific skin eruption: Secondary | ICD-10-CM

## 2022-08-11 MED ORDER — DOXYCYCLINE HYCLATE 100 MG PO TABS
100.00 mg | ORAL_TABLET | Freq: Two times a day (BID) | ORAL | 0 refills | Status: AC
Start: 2022-08-11 — End: 2022-08-16

## 2022-08-11 MED ORDER — VALACYCLOVIR HCL 500 MG PO TABS
500.00 mg | ORAL_TABLET | Freq: Every day | ORAL | 0 refills | Status: AC
Start: 2022-08-11 — End: 2022-08-18

## 2022-08-11 NOTE — Progress Notes (Signed)
Acute Office Visit  Subjective:     Patient ID: Jillian Hayes, female    DOB: 05-29-1945, 77 y.o.   MRN: [id number]  Chief Complaint  Patient presents with   Blister    HPI Patient is in today for rash.  Discussed the use of AI scribe software for clinical note transcription with the patient, who gave verbal consent to proceed.  History of Present Illness   The patient, a dialysis-dependent individual, presents with a week-long history of a progressive, blistering rash. The rash, initially thought to be shingles, is localized to the right side of the body, including the chest, arm, and back. The rash began as small, raised lesions that subsequently enlarged and blistered. Some of the blisters have dried up, while others appear red and infected. The patient denies any precipitating factors such as yard work or exposure to poison ivy. The rash is described as stinging/burning, uncomfortable, occasionally itchy. The patient denies systemic symptoms such as fever, chills, or body aches.  The patient attempted topical treatments, including hydrocortisone and anti-itch creams, without significant improvement. The rash on the neck is reported to be the most uncomfortable, with one area appearing to have ruptured. No recent dietary changes, skin care changes, pet/allergen exposures, etc.          ROS All review of systems negative except what is listed in the HPI      Objective:    BP (!) 166/74 (BP Location: Left Leg)   Pulse 72   Temp 97.9 F (36.6 C) (Oral)   Ht 5\' 6"  (1.676 m)   Wt 131 lb (59.4 kg)   SpO2 96%   BMI 21.14 kg/m    Physical Exam Vitals reviewed.  Constitutional:      Appearance: Normal appearance.  Skin:    General: Skin is warm and dry.     Findings: Rash present.     Comments: Erythematous rash with vesicular patches  Neurological:     Mental Status: She is alert and oriented to person, place, and time.  Psychiatric:        Mood and Affect: Mood  normal.        Behavior: Behavior normal.        Thought Content: Thought content normal.        Judgment: Judgment normal.             No results found for any visits on 08/11/22.      Assessment & Plan:   Problem List Items Addressed This Visit   None Visit Diagnoses     Rash    -  Primary Uncertain etiology, but eruptions are within C5 dermatome and her personal description does sound like possible shingles. Will give renal dose valtrex as well as adding doxycycline for possible secondary infection with spreading erythema. Discussed medications and supportive measures. Patient aware of signs/symptoms requiring further/urgent evaluation.     Relevant Medications   doxycycline (VIBRA-TABS) 100 MG tablet   valACYclovir (VALTREX) 500 MG tablet       Meds ordered this encounter  Medications   doxycycline (VIBRA-TABS) 100 MG tablet    Sig: Take 1 tablet (100 mg total) by mouth 2 (two) times daily for 5 days.    Dispense:  10 tablet    Refill:  0    Order Specific Question:   Supervising Provider    Answer:   Danise Edge A [[id number]]   valACYclovir (VALTREX) 500 MG tablet    Sig: Take  1 tablet (500 mg total) by mouth daily for 7 days.    Dispense:  7 tablet    Refill:  0    Order Specific Question:   Supervising Provider    Answer:   Danise Edge A [[id number]]    Return if symptoms worsen or fail to improve.  Clayborne Dana, NP

## 2022-08-12 DIAGNOSIS — Z992 Dependence on renal dialysis: Secondary | ICD-10-CM | POA: Diagnosis not present

## 2022-08-12 DIAGNOSIS — D631 Anemia in chronic kidney disease: Secondary | ICD-10-CM | POA: Diagnosis not present

## 2022-08-12 DIAGNOSIS — Z4901 Encounter for fitting and adjustment of extracorporeal dialysis catheter: Secondary | ICD-10-CM | POA: Diagnosis not present

## 2022-08-12 DIAGNOSIS — N2581 Secondary hyperparathyroidism of renal origin: Secondary | ICD-10-CM | POA: Diagnosis not present

## 2022-08-12 DIAGNOSIS — N186 End stage renal disease: Secondary | ICD-10-CM | POA: Diagnosis not present

## 2022-08-12 DIAGNOSIS — D509 Iron deficiency anemia, unspecified: Secondary | ICD-10-CM | POA: Diagnosis not present

## 2022-08-14 DIAGNOSIS — D631 Anemia in chronic kidney disease: Secondary | ICD-10-CM | POA: Diagnosis not present

## 2022-08-14 DIAGNOSIS — D509 Iron deficiency anemia, unspecified: Secondary | ICD-10-CM | POA: Diagnosis not present

## 2022-08-14 DIAGNOSIS — N186 End stage renal disease: Secondary | ICD-10-CM | POA: Diagnosis not present

## 2022-08-14 DIAGNOSIS — N2581 Secondary hyperparathyroidism of renal origin: Secondary | ICD-10-CM | POA: Diagnosis not present

## 2022-08-17 DIAGNOSIS — D509 Iron deficiency anemia, unspecified: Secondary | ICD-10-CM | POA: Diagnosis not present

## 2022-08-17 DIAGNOSIS — N2581 Secondary hyperparathyroidism of renal origin: Secondary | ICD-10-CM | POA: Diagnosis not present

## 2022-08-17 DIAGNOSIS — N186 End stage renal disease: Secondary | ICD-10-CM | POA: Diagnosis not present

## 2022-08-17 DIAGNOSIS — D631 Anemia in chronic kidney disease: Secondary | ICD-10-CM | POA: Diagnosis not present

## 2022-08-19 DIAGNOSIS — N186 End stage renal disease: Secondary | ICD-10-CM | POA: Diagnosis not present

## 2022-08-19 DIAGNOSIS — N2581 Secondary hyperparathyroidism of renal origin: Secondary | ICD-10-CM | POA: Diagnosis not present

## 2022-08-19 DIAGNOSIS — D509 Iron deficiency anemia, unspecified: Secondary | ICD-10-CM | POA: Diagnosis not present

## 2022-08-19 DIAGNOSIS — D631 Anemia in chronic kidney disease: Secondary | ICD-10-CM | POA: Diagnosis not present

## 2022-08-20 ENCOUNTER — Encounter: Payer: Self-pay | Admitting: Sports Medicine

## 2022-08-20 ENCOUNTER — Ambulatory Visit: Payer: Medicare HMO | Admitting: Sports Medicine

## 2022-08-20 DIAGNOSIS — G8929 Other chronic pain: Secondary | ICD-10-CM

## 2022-08-20 DIAGNOSIS — M25512 Pain in left shoulder: Secondary | ICD-10-CM | POA: Diagnosis not present

## 2022-08-20 DIAGNOSIS — M7551 Bursitis of right shoulder: Secondary | ICD-10-CM | POA: Diagnosis not present

## 2022-08-20 DIAGNOSIS — M25511 Pain in right shoulder: Secondary | ICD-10-CM

## 2022-08-20 DIAGNOSIS — N186 End stage renal disease: Secondary | ICD-10-CM

## 2022-08-20 DIAGNOSIS — I82A11 Acute embolism and thrombosis of right axillary vein: Secondary | ICD-10-CM

## 2022-08-20 MED ORDER — BUPIVACAINE HCL 0.25 % IJ SOLN
1.0000 mL | INTRAMUSCULAR | Status: AC | PRN
Start: 2022-08-20 — End: 2022-08-20
  Administered 2022-08-20: 1 mL

## 2022-08-20 MED ORDER — LIDOCAINE HCL 1 % IJ SOLN
1.0000 mL | INTRAMUSCULAR | Status: AC | PRN
Start: 2022-08-20 — End: 2022-08-20
  Administered 2022-08-20: 1 mL

## 2022-08-20 MED ORDER — METHYLPREDNISOLONE ACETATE 40 MG/ML IJ SUSP
40.0000 mg | INTRAMUSCULAR | Status: AC | PRN
Start: 2022-08-20 — End: 2022-08-20
  Administered 2022-08-20: 40 mg via INTRAMUSCULAR

## 2022-08-20 NOTE — Progress Notes (Signed)
Jillian Hayes - 77 y.o. female MRN [id number]  Date of birth: Feb 06, 1945  Office Visit Note: Visit Date: 08/20/2022 PCP: Bradd Canary, MD Referred by: Bradd Canary, MD  Subjective: Chief Complaint  Patient presents with   Right Shoulder - Pain   HPI: Jillian Hayes is a pleasant 77 y.o. female who presents today for bilateral shoulder pain.  Right shoulder started bothering her around March after she had a pneumonia vaccine.  Pain over the anterior lateral shoulder, does note some swelling sometimes.  No redness or warmth.  Has had posterior left shoulder and shoulder blade pain on the left since November.  This is most bothersome to her, this will keep her awake at night.  She has done formalized physical therapy with dry needling which provided only temporary relief but her pain will return.  She does massage this area with a ball, working on scapular retraction exercises.  She does note clicking and popping sensation within the shoulder blade with certain motions.  Has a notable past medical history of atrial fibrillation, dialysis with ESRD, history of AV fistula malfunction, DVT of right axillary vein.  Unfortunately has had some medical issues including acute DVT of the right upper extremity.  She has a left fistula that had a blockage, recently had a balloon surgery to open this up with a new graft, it is currently now being used properly.  Is on hemodialysis Monday Wednesday Fridays.  Chart review with right upper extremity Doppler venous ultrasound on 08/03/2022 seemingly acute DVT thrombosis within the jugular vein on the right with significant decrease pain blood flow.  There is also hematoma of the right upper arm.  Pertinent ROS were reviewed with the patient and found to be negative unless otherwise specified above in HPI.   Assessment & Plan: Visit Diagnoses:  1. Chronic right shoulder pain   2. Scapulothoracic bursitis of right shoulder   3. Trigger point of right  shoulder region   4. Chronic left shoulder pain   5. DVT of right axillary vein, acute (HCC)   6. ESRD (end stage renal disease) (HCC)    Plan: Had a discussion with Ajia today regarding her bilateral shoulder pain.  She has had issues with the right shoulder after pneumonia vaccine, may have a degree of subacromial bursitis or irritation to the rotator cuff but good strength.  Her most bothersome pain is the left shoulder which seems to be a combination of medial scapular border trigger points and possibly concomitant scapulothoracic bursitis.  She has had some medical issues and currently has transition from warfarin to Eliquis, needed to refrain from any NSAIDs.  Through shared decision-making, elected to proceed with trigger point injections over the left medial scapular border, patient tolerated well.  Will allow for 48 hours of modified rest, then I would like her to get back into her scapular and shoulder rehab exercises she was shown at physical therapy.  Discussed for scapulothoracic bursa injection, we will hold for now.  We will see her back in about 3 weeks and reevaluate to see how she is doing.  Follow-up: Return in about 3 weeks (around 09/10/2022) for for L-scapular (30-mins for possible ST-inj).   Meds & Orders: No orders of the defined types were placed in this encounter.   Orders Placed This Encounter  Procedures   Trigger Point Inj     Procedures: Trigger Point Inj  Date/Time: 08/20/2022 1:31 PM  Performed by: Madelyn Brunner, DO Authorized by:  Madelyn Brunner, DO   Consent Given by:  Patient Site marked: the procedure site was marked   Timeout: prior to procedure the correct patient, procedure, and site was verified   Indications:  Pain and therapeutic Total # of Trigger Points:  2 Location: shoulder   Needle Size:  27 G Approach:  Dorsal Medications #1:  1 mL lidocaine 1 %; 1 mL bupivacaine 0.25 %; 40 mg methylPREDNISolone acetate 40 MG/ML Medications #2:  1 mL lidocaine  1 %; 1 mL bupivacaine 0.25 % Patient tolerance:  Patient tolerated the procedure well with no immediate complications Comments: Procedure: Trigger point injections (2), right medial scapular border After discussion on R/B/I and informed verbal consent was obtained, a timeout was conducted. The patient was placed in a prone position on the examination table and the area of maximal tenderness was identified over the medial scapular border musculature.  This area was cleansed with Betadine and multiple alcohol swabs. Ethyl chloride was used for local anesthesia. Using a 27-gauge 1.5 inch needle the trigger point(s) was subsequently injected with a mixture of 40 cc of methylprednisolone 40 mg/mL and 2 cc of 1% lidocaine without epinephrine, and 2 cc of bupivicaine 0.25% with a total of 2.5 cc of injectate into each trigger point (2). A band-aid was applied following. Patient tolerated procedure well, there were no post-injection complications. Post-procedure instructions were given.         Clinical History: No specialty comments available.  She reports that she has never smoked. She has never used smokeless tobacco.  Recent Labs    07/28/22 1246  HGBA1C 4.7    Objective:   Vital Signs: There were no vitals taken for this visit.  Physical Exam  Gen: Well-appearing, in no acute distress; non-toxic CV:  Well-perfused. Warm.  Resp: Breathing unlabored on room air; no wheezing. Psych: Fluid speech in conversation; appropriate affect; normal thought process Neuro: Sensation intact throughout. No gross coordination deficits.   Ortho Exam - Right shoulder: There is mild swelling of the subacromial bursa of the right shoulder, no warmth or redness.  No bony TTP.  There is pain with reaching motions at endrange abduction, mildly positive drop arm test.  - Left shoulder: No bony TTP, redness or effusion.  There is crepitus with a grating sensation taking it through range of motion.  There is  bilateral shoulder protraction.  Positive TTP with muscle hypertonicity and associated trigger points over the right medial scapular border.  I cannot reproduce scapulothoracic clicking or range of motion today but patient does tell me this happens.  Imaging:  Narrative & Impression  CLINICAL DATA:  Upper back pain for greater than 6 weeks.   EXAM: THORACIC SPINE 2 VIEWS   COMPARISON:  02/26/2022.   FINDINGS: There is no evidence of thoracic spine fracture. Alignment is normal. There is mild scoliosis of the thoracic spine. Mild degenerative endplate changes are noted in the midthoracic spine. The heart is enlarged and there is atherosclerotic calcification of the aorta.   IMPRESSION: 1. No acute fracture. 2. Mild degenerative changes in the thoracic spine.     Electronically Signed   By: Thornell Sartorius M.D.   On: 03/03/2022 23:45    Narrative & Impression  CLINICAL DATA:  neck pain   EXAM: CERVICAL SPINE - COMPLETE 6 VIEW   COMPARISON:  04/21/2012   FINDINGS: No fracture, dislocation or subluxation. No spondylolisthesis. No osteolytic or osteoblastic changes. Prevertebral and cervical cranial soft tissues are unremarkable.   Degenerative  disc disease noted with disc space narrowing and marginal osteophytes at C4-C7.   IMPRESSION: Degenerative changes. No acute osseous abnormalities.     Electronically Signed   By: Layla Maw M.D.   On: 05/07/2022 09:04    *Independent review and interpretation of right shoulder x-rays from 07/28/2022 was performed by myself today.  3 views of the right shoulder show mild to moderate glenohumeral joint arthritis, mild to moderate AC joint arthropathy.  Humeral head is well located.  There is overlying right-sided dialysis catheter tip likely within the distal SVC.  No acute fracture or other bony abnormality noted.  Narrative & Impression  CLINICAL DATA:  Pain.   EXAM: RIGHT SHOULDER - 3 VIEW   COMPARISON:  None  Available.   FINDINGS: There is no evidence of fracture or dislocation. There is no evidence of arthropathy or other focal bone abnormality. Soft tissues are unremarkable.   IMPRESSION: Negative.     Electronically Signed   By: Layla Maw M.D.   On: 08/02/2022 10:45      Narrative & Impression  CLINICAL DATA:  Right arm pain and swelling, currently on warfarin   EXAM: RIGHT UPPER EXTREMITY VENOUS DOPPLER ULTRASOUND   TECHNIQUE: Gray-scale sonography with graded compression, as well as color Doppler and duplex ultrasound were performed to evaluate the upper extremity deep venous system from the level of the subclavian vein and including the jugular, axillary, basilic, radial, ulnar and upper cephalic vein. Spectral Doppler was utilized to evaluate flow at rest and with distal augmentation maneuvers.   COMPARISON:  None Available.   FINDINGS: Contralateral Subclavian Vein: Respiratory phasicity is normal and symmetric with the symptomatic side. No evidence of thrombus. Normal compressibility.   Internal Jugular Vein: Jugular vein is well distended with static flow identified consistent with thrombosis. This appears to extend to the level of the vein entry site for previously placed dialysis catheter.   Subclavian Vein: Not well visualized due to overlying bandages.   Axillary Vein: No evidence of thrombus. Normal compressibility, respiratory phasicity and response to augmentation.   Cephalic Vein: No evidence of thrombus. Normal compressibility, respiratory phasicity and response to augmentation.   Basilic Vein: No evidence of thrombus. Normal compressibility, respiratory phasicity and response to augmentation.   Brachial Veins: No evidence of thrombus. Normal compressibility, respiratory phasicity and response to augmentation.   Radial Veins: No evidence of thrombus. Normal compressibility, respiratory phasicity and response to augmentation.   Ulnar  Veins: No evidence of thrombus. Normal compressibility, respiratory phasicity and response to augmentation.   Venous Reflux:  None visualized.   Other Findings: Complex mixed echogenicity area is noted in the midportion of the upper arm likely representing a hematoma. This measures 6.0 x 2.7 x 2.3 cm in dimension.   IMPRESSION: Deep venous thrombosis within the jugular vein on the right with significant decrease in flow identified. This appears to extend to the vein entry site for previously placed jugular dialysis catheter.   Nonvisualization of the subclavian vein due to overlying bandaging.   Mixed echogenicity lesion within the midportion of the right upper arm most consistent with a hematoma. Patient gives history of prior vaccination in this region although that was several weeks ago. Clinical follow-up is recommended. The need for repeat ultrasound can be determined on a clinical basis.   These results will be called to the ordering clinician or representative by the Radiologist Assistant, and communication documented in the PACS or [employer].   Electronically Signed: By: Loraine Leriche  Lukens M.D. On: 08/03/2022 19:38    Past Medical/Family/Surgical/Social History: Medications & Allergies reviewed per EMR, new medications updated. Patient Active Problem List   Diagnosis Date Noted   DVT of right axillary vein, acute (HCC) 08/07/2022   Swelling of upper arm 08/03/2022   Abnormal INR 08/03/2022   SOB (shortness of breath) 07/28/2022   Urinary hesitancy 07/28/2022   Osteoporosis 07/28/2022   Right shoulder pain 07/27/2022   Dialysis AV fistula malfunction (HCC) 06/30/2022   Upper back pain 05/05/2022   Neck pain 05/05/2022   Hyperglycemia 05/05/2022   Disorder of vitamin B12 05/05/2022   Pulmonary hypertension, primary (HCC) 03/09/2021   Low TSH level 03/09/2021   Right knee pain 03/06/2021   Primary osteoarthritis of right knee 02/25/2021   Bilateral carotid  artery stenosis 11/21/2020   Adnexal mass    Left foot drop 02/08/2020   A-V fistula (HCC) 08/03/2019   Nonrheumatic mitral valve regurgitation 06/12/2019   Edema 04/09/2019   Drug-induced constipation 10/18/2018   External prolapsed hemorrhoids 09/25/2018   Malignant hypertension 04/20/2018   Hyperphosphatemia 03/09/2018   Leg swelling 03/03/2018   Long term (current) use of anticoagulants 11/01/2017   Atrial fibrillation (HCC) 10/25/2017   Hyperparathyroidism, unspecified (HCC) 05/04/2017   DDD (degenerative disc disease), lumbar 04/07/2017   Hearing loss 01/04/2017   Hyperlipidemia 01/04/2017   Arthritis of fingers of both hands 10/01/2016   Chronic rhinitis 10/01/2016   Nonrheumatic aortic valve insufficiency 09/29/2016   Bruit 09/29/2016   Palpitation 09/22/2016   Aortic valve disease 04/28/2016   Anemia in ESRD (end-stage renal disease) (HCC) 03/03/2016   Sun-damaged skin 01/21/2016   ESRD (end stage renal disease) (HCC) 11/09/2015   Chronic kidney disease, stage 5 (HCC) 07/03/2015   Advanced care planning/counseling discussion 06/10/2014   Pedal edema 03/28/2014   Proteinuria 02/18/2014   Hypercalcemia 02/18/2014   Abnormal EKG 01/22/2014   HTN (hypertension) 01/22/2014   Dermatitis 06/30/2013   Abnormal thyroid function test 06/25/2013   Cervical cancer screening 06/10/2012   Preventative health care 11/09/2011   Multiple chemical sensitivity syndrome 06/25/2010   History of chicken pox 06/25/2010   Bladder polyps 06/25/2010   History of cardiac dysrhythmia 06/25/2010   Insomnia 06/24/2010   Fatigue 06/24/2010   Chronic headaches 06/24/2010   Allergy    Anemia    Depression    Past Medical History:  Diagnosis Date   Advanced care planning/counseling discussion 06/10/2014   05/31/2014 patient presents copy of HCP and Living Will   Anemia    iron deficiency   Anxiety    Arthritis    "in hands"   Atrial fibrillation (HCC)    Benign fundic gland polyps of  stomach    Bladder polyps 06/25/2010   Chronic headaches 06/24/2010   Complication of anesthesia    "woke up at the end of a cyst removal surgery in 1991"   Depression 1991   hospitalized   Diverticulosis    DVT of right axillary vein, acute (HCC) 08/07/2022   ESRD (end stage renal disease) (HCC) 11/09/2015   HD on MWF   External prolapsed hemorrhoids    History of chicken pox 06/25/2010   Hypercalcemia 02/18/2014   Hypertension    Insomnia 06/24/2010   Multiple chemical sensitivity syndrome 06/25/2010   Proteinuria 02/18/2014   Renal insufficiency 03/26/2011   Valvular heart disease 04/28/2016   Family History  Problem Relation Age of Onset   Breast cancer Mother 28       left breast removed,  2010 lung   Diabetes Mother    Nephrolithiasis Father    Heart attack Father 38       X 3   Heart disease Father        smoker   Hypertension Brother    Allergic rhinitis Brother    Melanoma Son 37       melanoma on leg removed   Pancreatic cancer Maternal Grandmother    Hypertension Paternal Grandmother    Obesity Paternal Grandmother    Heart attack Paternal Grandfather 22   Diabetes Maternal Aunt        x 2   Diabetes Maternal Uncle        x 3   Asthma Neg Hx    Eczema Neg Hx    Urticaria Neg Hx    Immunodeficiency Neg Hx    Angioedema Neg Hx    Past Surgical History:  Procedure Laterality Date   AV FISTULA PLACEMENT Left    x4   COLONOSCOPY  2014   cyst on left breast removed Left 1991   benign   ESOPHAGOGASTRODUODENOSCOPY (EGD) WITH ESOPHAGEAL DILATION  2014   LAPAROSCOPIC BILATERAL SALPINGO OOPHERECTOMY Bilateral 08/20/2020   Procedure: LAPAROSCOPIC BILATERAL SALPINGO OOPHORECTOMY;  Surgeon: Jerene Bears, MD;  Location: [facility] OR;  Service: Gynecology;  Laterality: Bilateral;   NASAL SEPTUM SURGERY  1986   rhinoplasty   polyps on bladder removed  1972   benign   RIGHT HEART CATH N/A 04/10/2021   Procedure: RIGHT HEART CATH;  Surgeon: Laurey Morale, MD;   Location: [facility] INVASIVE CV LAB;  Service: Cardiovascular;  Laterality: N/A;   TONSILLECTOMY  1962   Social History   Occupational History   Occupation: retired  Tobacco Use   Smoking status: Never   Smokeless tobacco: Never  Vaping Use   Vaping status: Never Used  Substance and Sexual Activity   Alcohol use: No   Drug use: No   Sexual activity: Yes    Partners: Male

## 2022-08-20 NOTE — Progress Notes (Signed)
Pain since November 2023  Pain with ADLs Has tried PT for over a month- no relief Has tried Dry Needling Tylenol for pain

## 2022-08-21 DIAGNOSIS — N186 End stage renal disease: Secondary | ICD-10-CM | POA: Diagnosis not present

## 2022-08-21 DIAGNOSIS — N2581 Secondary hyperparathyroidism of renal origin: Secondary | ICD-10-CM | POA: Diagnosis not present

## 2022-08-21 DIAGNOSIS — D509 Iron deficiency anemia, unspecified: Secondary | ICD-10-CM | POA: Diagnosis not present

## 2022-08-21 DIAGNOSIS — D631 Anemia in chronic kidney disease: Secondary | ICD-10-CM | POA: Diagnosis not present

## 2022-08-24 DIAGNOSIS — D631 Anemia in chronic kidney disease: Secondary | ICD-10-CM | POA: Diagnosis not present

## 2022-08-24 DIAGNOSIS — N2581 Secondary hyperparathyroidism of renal origin: Secondary | ICD-10-CM | POA: Diagnosis not present

## 2022-08-24 DIAGNOSIS — N186 End stage renal disease: Secondary | ICD-10-CM | POA: Diagnosis not present

## 2022-08-24 DIAGNOSIS — D509 Iron deficiency anemia, unspecified: Secondary | ICD-10-CM | POA: Diagnosis not present

## 2022-08-25 ENCOUNTER — Ambulatory Visit (HOSPITAL_BASED_OUTPATIENT_CLINIC_OR_DEPARTMENT_OTHER)
Admission: RE | Admit: 2022-08-25 | Discharge: 2022-08-25 | Disposition: A | Discharge status: Home / Self Care | Payer: Medicare HMO | Source: Ambulatory Visit | Attending: Family Medicine | Admitting: Family Medicine

## 2022-08-25 ENCOUNTER — Other Ambulatory Visit: Payer: Self-pay | Admitting: Family Medicine

## 2022-08-25 DIAGNOSIS — I517 Cardiomegaly: Secondary | ICD-10-CM

## 2022-08-25 DIAGNOSIS — R0602 Shortness of breath: Secondary | ICD-10-CM | POA: Diagnosis not present

## 2022-08-25 DIAGNOSIS — R0600 Dyspnea, unspecified: Secondary | ICD-10-CM

## 2022-08-25 DIAGNOSIS — I3139 Other pericardial effusion (noninflammatory): Secondary | ICD-10-CM

## 2022-08-25 LAB — ECHOCARDIOGRAM COMPLETE
AR max vel: 2.82 cm2
AV Area VTI: 2.64 cm2
AV Area mean vel: 2.7 cm2
AV Mean grad: 7 mmHg
AV Peak grad: 15.7 mmHg
Ao pk vel: 1.98 m/s
Area-P 1/2: 3.37 cm2
MV M vel: 5.02 m/s
MV Peak grad: 100.8 mmHg
P 1/2 time: 395 msec
S' Lateral: 2.8 cm

## 2022-08-26 DIAGNOSIS — D509 Iron deficiency anemia, unspecified: Secondary | ICD-10-CM | POA: Diagnosis not present

## 2022-08-26 DIAGNOSIS — N186 End stage renal disease: Secondary | ICD-10-CM | POA: Diagnosis not present

## 2022-08-26 DIAGNOSIS — N2581 Secondary hyperparathyroidism of renal origin: Secondary | ICD-10-CM | POA: Diagnosis not present

## 2022-08-26 DIAGNOSIS — D631 Anemia in chronic kidney disease: Secondary | ICD-10-CM | POA: Diagnosis not present

## 2022-08-28 DIAGNOSIS — D631 Anemia in chronic kidney disease: Secondary | ICD-10-CM | POA: Diagnosis not present

## 2022-08-28 DIAGNOSIS — N186 End stage renal disease: Secondary | ICD-10-CM | POA: Diagnosis not present

## 2022-08-28 DIAGNOSIS — N2581 Secondary hyperparathyroidism of renal origin: Secondary | ICD-10-CM | POA: Diagnosis not present

## 2022-08-28 DIAGNOSIS — D509 Iron deficiency anemia, unspecified: Secondary | ICD-10-CM | POA: Diagnosis not present

## 2022-08-31 DIAGNOSIS — D631 Anemia in chronic kidney disease: Secondary | ICD-10-CM | POA: Diagnosis not present

## 2022-08-31 DIAGNOSIS — N186 End stage renal disease: Secondary | ICD-10-CM | POA: Diagnosis not present

## 2022-08-31 DIAGNOSIS — D509 Iron deficiency anemia, unspecified: Secondary | ICD-10-CM | POA: Diagnosis not present

## 2022-08-31 DIAGNOSIS — N2581 Secondary hyperparathyroidism of renal origin: Secondary | ICD-10-CM | POA: Diagnosis not present

## 2022-09-02 DIAGNOSIS — N186 End stage renal disease: Secondary | ICD-10-CM | POA: Diagnosis not present

## 2022-09-02 DIAGNOSIS — D509 Iron deficiency anemia, unspecified: Secondary | ICD-10-CM | POA: Diagnosis not present

## 2022-09-02 DIAGNOSIS — Z992 Dependence on renal dialysis: Secondary | ICD-10-CM | POA: Diagnosis not present

## 2022-09-02 DIAGNOSIS — D631 Anemia in chronic kidney disease: Secondary | ICD-10-CM | POA: Diagnosis not present

## 2022-09-02 DIAGNOSIS — N2581 Secondary hyperparathyroidism of renal origin: Secondary | ICD-10-CM | POA: Diagnosis not present

## 2022-09-03 ENCOUNTER — Encounter: Payer: Self-pay | Admitting: Hematology & Oncology

## 2022-09-03 ENCOUNTER — Other Ambulatory Visit: Payer: Self-pay

## 2022-09-03 ENCOUNTER — Inpatient Hospital Stay (HOSPITAL_BASED_OUTPATIENT_CLINIC_OR_DEPARTMENT_OTHER): Payer: Medicare HMO | Admitting: Hematology & Oncology

## 2022-09-03 ENCOUNTER — Inpatient Hospital Stay: Payer: Medicare HMO | Attending: Hematology & Oncology

## 2022-09-03 VITALS — BP 143/47 | HR 63 | Temp 98.9°F | Resp 18 | Ht 66.0 in | Wt 131.8 lb

## 2022-09-03 DIAGNOSIS — I82A11 Acute embolism and thrombosis of right axillary vein: Secondary | ICD-10-CM

## 2022-09-03 DIAGNOSIS — I82C11 Acute embolism and thrombosis of right internal jugular vein: Secondary | ICD-10-CM | POA: Diagnosis not present

## 2022-09-03 DIAGNOSIS — Z79899 Other long term (current) drug therapy: Secondary | ICD-10-CM | POA: Insufficient documentation

## 2022-09-03 DIAGNOSIS — Z7901 Long term (current) use of anticoagulants: Secondary | ICD-10-CM | POA: Diagnosis not present

## 2022-09-03 DIAGNOSIS — G479 Sleep disorder, unspecified: Secondary | ICD-10-CM | POA: Insufficient documentation

## 2022-09-03 DIAGNOSIS — Z992 Dependence on renal dialysis: Secondary | ICD-10-CM | POA: Insufficient documentation

## 2022-09-03 LAB — CBC WITH DIFFERENTIAL (CANCER CENTER ONLY)
Abs Immature Granulocytes: 0.02 10*3/uL (ref 0.00–0.07)
Basophils Absolute: 0 10*3/uL (ref 0.0–0.1)
Basophils Relative: 1 %
Eosinophils Absolute: 0.1 10*3/uL (ref 0.0–0.5)
Eosinophils Relative: 2 %
HCT: 36 % (ref 36.0–46.0)
Hemoglobin: 10.9 g/dL — ABNORMAL LOW (ref 12.0–15.0)
Immature Granulocytes: 0 %
Lymphocytes Relative: 22 %
Lymphs Abs: 1.4 10*3/uL (ref 0.7–4.0)
MCH: 32.8 pg (ref 26.0–34.0)
MCHC: 30.3 g/dL (ref 30.0–36.0)
MCV: 108.4 fL — ABNORMAL HIGH (ref 80.0–100.0)
Monocytes Absolute: 1 10*3/uL (ref 0.1–1.0)
Monocytes Relative: 17 %
Neutro Abs: 3.5 10*3/uL (ref 1.7–7.7)
Neutrophils Relative %: 58 %
Platelet Count: 207 10*3/uL (ref 150–400)
RBC: 3.32 MIL/uL — ABNORMAL LOW (ref 3.87–5.11)
RDW: 16.4 % — ABNORMAL HIGH (ref 11.5–15.5)
WBC Count: 6.1 10*3/uL (ref 4.0–10.5)
nRBC: 0 % (ref 0.0–0.2)

## 2022-09-03 LAB — CMP (CANCER CENTER ONLY)
ALT: 12 U/L (ref 0–44)
AST: 19 U/L (ref 15–41)
Albumin: 3.8 g/dL (ref 3.5–5.0)
Alkaline Phosphatase: 79 U/L (ref 38–126)
Anion gap: 8 (ref 5–15)
BUN: 32 mg/dL — ABNORMAL HIGH (ref 8–23)
CO2: 31 mmol/L (ref 22–32)
Calcium: 9.4 mg/dL (ref 8.9–10.3)
Chloride: 100 mmol/L (ref 98–111)
Creatinine: 6.05 mg/dL — ABNORMAL HIGH (ref 0.44–1.00)
GFR, Estimated: 7 mL/min — ABNORMAL LOW (ref >60)
Glucose, Bld: 111 mg/dL — ABNORMAL HIGH (ref 70–99)
Potassium: 5.5 mmol/L — ABNORMAL HIGH (ref 3.5–5.1)
Sodium: 139 mmol/L (ref 135–145)
Total Bilirubin: 0.4 mg/dL (ref 0.3–1.2)
Total Protein: 6 g/dL — ABNORMAL LOW (ref 6.5–8.1)

## 2022-09-03 LAB — FERRITIN: Ferritin: 927 ng/mL — ABNORMAL HIGH (ref 11–307)

## 2022-09-03 LAB — LACTATE DEHYDROGENASE: LDH: 170 U/L (ref 98–192)

## 2022-09-03 NOTE — Progress Notes (Signed)
Hematology and Oncology Follow Up Visit  Jillian Hayes [id number] 1946/01/30 77 y.o. 09/03/2022   Principle Diagnosis:  Thromboembolic disease of the right jugular vein  Current Therapy:   Eliquis 5 mg p.o. twice daily     Interim History:  Jillian Hayes is back for follow-up.  This is her second office visit.  We saw her in early July.  At that time, she presented with a swelling in the neck.  She had a Doppler that showed a thrombus in the internal jugular vein on the right side.  She also had a hematoma in the right arm.  Patient has a dialysis catheter in.  This is now been taken out.  The hematoma that she had measured 6 x 2.7 x 2.3 cm.  This seems to be resolving.  She now is on Eliquis and doing fine on the Eliquis.  Her main problem right now is that she is not sleeping.  She has had this problem for years.  It does not seem like anybody can seem to figure out a way to help this.  I will ask our pharmacist what sleep aids we could try for her with her being on dialysis.  She is getting her dialysis and having no problems with this.  She has had no cough.  There is been no change in bowel habits.  She has had no leg swelling.  She has had no fever.  There is no cough or shortness of breath.  Overall, I would say that her performance status is probably ECOG 2.  Medications:  Current Outpatient Medications:    acetaminophen (TYLENOL) 500 MG tablet, Take 500 mg by mouth every 6 (six) hours as needed for headache (pain)., Disp: , Rfl:    Alfalfa 500 MG TABS, Take 500 mg by mouth 3 (three) times daily., Disp: , Rfl:    apixaban (ELIQUIS) 5 MG TABS tablet, Take 1 tablet (5 mg total) by mouth 2 (two) times daily., Disp: 60 tablet, Rfl: 6   atorvastatin (LIPITOR) 20 MG tablet, Take 20 mg by mouth at bedtime., Disp: , Rfl:    carvedilol (COREG) 25 MG tablet, Take 1 tablet by mouth twice daily, Disp: 180 tablet, Rfl: 0   cinacalcet (SENSIPAR) 30 MG tablet, Take 30 mg by mouth at  bedtime., Disp: , Rfl:    diltiazem (CARDIZEM CD) 240 MG 24 hr capsule, Take 240 mg by mouth daily with lunch., Disp: , Rfl:    hydrALAZINE (APRESOLINE) 50 MG tablet, Take 75 mg by mouth 2 (two) times daily., Disp: , Rfl:    L-Glutamine 500 MG CAPS, Take 500 mg by mouth every evening., Disp: , Rfl:    lidocaine 4 %, Place 1 patch onto the skin daily., Disp: 10 patch, Rfl: 1   lidocaine-prilocaine (EMLA) cream, Apply 1 application  topically every Monday, Wednesday, and Friday with hemodialysis., Disp: , Rfl:    Misc Natural Products (SAMBUCUS ELDERBERRY IMMUNE) CHEW, Chew 1 capsule by mouth daily., Disp: , Rfl:    OVER THE COUNTER MEDICATION, Take 3 capsules by mouth every evening. Shaklee Herb-Lax Natural Laxative for Colon Cleanse (Senna, Licorice, and Alfalfa), Disp: , Rfl:    sevelamer carbonate (RENVELA) 800 MG tablet, Take 2,400 mg by mouth See admin instructions. Take 3 tablets (2400 mg) by mouth with each meal & take 2 tablets (1600 mg) by mouth with each large snack, Disp: , Rfl:    vitamin C (ASCORBIC ACID) 500 MG tablet, Take 500 mg by mouth  daily., Disp: , Rfl:   Current Facility-Administered Medications:    triamcinolone acetonide (KENALOG-40) injection 40 mg, 40 mg, Intra-articular, Once, Myra Rude, MD  Allergies:  Allergies  Allergen Reactions   Clonidine Derivatives Palpitations   Sulfa Antibiotics Nausea And Vomiting    Past Medical History, Surgical history, Social history, and Family History were reviewed and updated.  Review of Systems: Review of Systems  Constitutional: Negative.   HENT:  Negative.    Eyes: Negative.   Respiratory: Negative.    Cardiovascular: Negative.   Gastrointestinal: Negative.   Endocrine: Negative.   Genitourinary: Negative.    Musculoskeletal: Negative.   Skin: Negative.   Neurological: Negative.   Hematological: Negative.   Psychiatric/Behavioral:  Positive for sleep disturbance.     Physical Exam:  height is 5\' 6"  (1.676  m) and weight is 131 lb 12.8 oz (59.8 kg). Her oral temperature is 98.9 F (37.2 C). Her blood pressure is 143/47 (abnormal) and her pulse is 63. Her respiration is 18 and oxygen saturation is 98%.   Wt Readings from Last 3 Encounters:  09/03/22 131 lb 12.8 oz (59.8 kg)  08/11/22 131 lb (59.4 kg)  08/07/22 136 lb (61.7 kg)    Physical Exam Vitals reviewed.  HENT:     Head: Normocephalic and atraumatic.  Eyes:     Pupils: Pupils are equal, round, and reactive to light.  Cardiovascular:     Rate and Rhythm: Normal rate and regular rhythm.     Heart sounds: Normal heart sounds.  Pulmonary:     Effort: Pulmonary effort is normal.     Breath sounds: Normal breath sounds.  Abdominal:     General: Bowel sounds are normal.     Palpations: Abdomen is soft.  Musculoskeletal:        General: No tenderness or deformity. Normal range of motion.     Cervical back: Normal range of motion.  Lymphadenopathy:     Cervical: No cervical adenopathy.  Skin:    General: Skin is warm and dry.     Findings: No erythema or rash.  Neurological:     Mental Status: She is alert and oriented to person, place, and time.  Psychiatric:        Behavior: Behavior normal.        Thought Content: Thought content normal.        Judgment: Judgment normal.     Lab Results  Component Value Date   WBC 6.1 09/03/2022   HGB 10.9 (L) 09/03/2022   HCT 36.0 09/03/2022   MCV 108.4 (H) 09/03/2022   PLT 207 09/03/2022     Chemistry      Component Value Date/Time   NA 139 09/03/2022 1433   NA 145 (H) 04/01/2021 1608   NA 140 03/25/2016 1111   K 5.5 (H) 09/03/2022 1433   K 5.9 No visable hemolysis (H) 03/25/2016 1111   CL 100 09/03/2022 1433   CO2 31 09/03/2022 1433   CO2 23 03/25/2016 1111   BUN 32 (H) 09/03/2022 1433   BUN 35 (H) 04/01/2021 1608   BUN 51.8 (H) 03/25/2016 1111   CREATININE 6.05 (H) 09/03/2022 1433   CREATININE 2.4 (H) 03/25/2016 1111   GLU 0 12/06/2019 0000      Component Value  Date/Time   CALCIUM 9.4 09/03/2022 1433   CALCIUM 10.5 (H) 03/25/2016 1111   ALKPHOS 79 09/03/2022 1433   ALKPHOS 73 03/25/2016 1111   AST 19 09/03/2022 1433   AST 26  03/25/2016 1111   ALT 12 09/03/2022 1433   ALT 19 03/25/2016 1111   BILITOT 0.4 09/03/2022 1433   BILITOT 0.35 03/25/2016 1111      Impression and Plan: Ms. Drewek is a very nice 77 year old white female.  She developed a thrombus in the right internal jugular vein.  This was back in early July.  She is on Eliquis right now.  As such, we probably will have to repeat a Doppler when we get her back for follow-up.  Hopefully, the thrombus will have resolved.  I think that the thrombus probably emanated from the fact that she had a dialysis catheter in the right internal jugular vein.  Again, her main problem has been the lack of sleep.  We will try to help her with this if possible.  We will plan to see her back in about 6 weeks or so.  Hopefully, by the time we see her back, she will be sleeping better.   Josph Macho, MD 8/1/20243:56 PM

## 2022-09-04 DIAGNOSIS — N2581 Secondary hyperparathyroidism of renal origin: Secondary | ICD-10-CM | POA: Diagnosis not present

## 2022-09-04 DIAGNOSIS — D631 Anemia in chronic kidney disease: Secondary | ICD-10-CM | POA: Diagnosis not present

## 2022-09-04 DIAGNOSIS — D509 Iron deficiency anemia, unspecified: Secondary | ICD-10-CM | POA: Diagnosis not present

## 2022-09-04 DIAGNOSIS — N186 End stage renal disease: Secondary | ICD-10-CM | POA: Diagnosis not present

## 2022-09-07 DIAGNOSIS — D631 Anemia in chronic kidney disease: Secondary | ICD-10-CM | POA: Diagnosis not present

## 2022-09-07 DIAGNOSIS — N2581 Secondary hyperparathyroidism of renal origin: Secondary | ICD-10-CM | POA: Diagnosis not present

## 2022-09-07 DIAGNOSIS — N186 End stage renal disease: Secondary | ICD-10-CM | POA: Diagnosis not present

## 2022-09-07 DIAGNOSIS — D509 Iron deficiency anemia, unspecified: Secondary | ICD-10-CM | POA: Diagnosis not present

## 2022-09-08 ENCOUNTER — Ambulatory Visit (INDEPENDENT_AMBULATORY_CARE_PROVIDER_SITE_OTHER): Payer: Medicare HMO | Admitting: Family Medicine

## 2022-09-08 ENCOUNTER — Ambulatory Visit (HOSPITAL_BASED_OUTPATIENT_CLINIC_OR_DEPARTMENT_OTHER)
Admission: RE | Admit: 2022-09-08 | Discharge: 2022-09-08 | Disposition: A | Discharge status: Home / Self Care | Payer: Medicare HMO | Source: Ambulatory Visit | Attending: Hematology & Oncology | Admitting: Hematology & Oncology

## 2022-09-08 ENCOUNTER — Other Ambulatory Visit: Payer: Self-pay | Admitting: Hematology & Oncology

## 2022-09-08 VITALS — BP 130/60 | HR 77 | Temp 98.0°F | Resp 16 | Ht 66.0 in | Wt 132.2 lb

## 2022-09-08 DIAGNOSIS — I82C11 Acute embolism and thrombosis of right internal jugular vein: Secondary | ICD-10-CM | POA: Diagnosis not present

## 2022-09-08 DIAGNOSIS — N185 Chronic kidney disease, stage 5: Secondary | ICD-10-CM

## 2022-09-08 DIAGNOSIS — I10 Essential (primary) hypertension: Secondary | ICD-10-CM

## 2022-09-08 DIAGNOSIS — I770 Arteriovenous fistula, acquired: Secondary | ICD-10-CM | POA: Diagnosis not present

## 2022-09-08 DIAGNOSIS — M810 Age-related osteoporosis without current pathological fracture: Secondary | ICD-10-CM

## 2022-09-08 DIAGNOSIS — I82A11 Acute embolism and thrombosis of right axillary vein: Secondary | ICD-10-CM

## 2022-09-08 DIAGNOSIS — E782 Mixed hyperlipidemia: Secondary | ICD-10-CM | POA: Diagnosis not present

## 2022-09-08 DIAGNOSIS — S40021A Contusion of right upper arm, initial encounter: Secondary | ICD-10-CM | POA: Diagnosis not present

## 2022-09-08 MED ORDER — TIZANIDINE HCL 2 MG PO TABS: 1.0000 mg | ORAL_TABLET | Freq: Four times a day (QID) | ORAL | 0 refills | Status: DC | PRN

## 2022-09-08 NOTE — Patient Instructions (Signed)
Neurocognitive testing

## 2022-09-09 DIAGNOSIS — D509 Iron deficiency anemia, unspecified: Secondary | ICD-10-CM | POA: Diagnosis not present

## 2022-09-09 DIAGNOSIS — N186 End stage renal disease: Secondary | ICD-10-CM | POA: Diagnosis not present

## 2022-09-09 DIAGNOSIS — N2581 Secondary hyperparathyroidism of renal origin: Secondary | ICD-10-CM | POA: Diagnosis not present

## 2022-09-09 DIAGNOSIS — D631 Anemia in chronic kidney disease: Secondary | ICD-10-CM | POA: Diagnosis not present

## 2022-09-10 ENCOUNTER — Encounter: Payer: Self-pay | Admitting: Family Medicine

## 2022-09-10 ENCOUNTER — Telehealth: Payer: Self-pay

## 2022-09-10 NOTE — Patient Outreach (Signed)
  Care Coordination   09/10/2022 Name: Jillian Hayes MRN: [id number] DOB: 1945/03/19   Care Coordination Outreach Attempts:  An unsuccessful telephone outreach was attempted for a scheduled appointment today.  Follow Up Plan:  Additional outreach attempts will be made to offer the patient care coordination information and services.   Encounter Outcome:  No Answer   Care Coordination Interventions:  No, not indicated    Kathyrn Sheriff, RN, MSN, BSN, CCM [facility] Care Coordinator [phone]

## 2022-09-10 NOTE — Assessment & Plan Note (Signed)
Encourage heart healthy diet such as MIND or DASH diet, increase exercise, avoid trans fats, simple carbohydrates and processed foods, consider a krill or fish or flaxseed oil cap daily.  °

## 2022-09-10 NOTE — Progress Notes (Signed)
Subjective:    Patient ID: Jillian Hayes, female    DOB: 12-12-1945, 77 y.o.   MRN: [id number]  Chief Complaint  Patient presents with   Follow-up    Follow up    HPI Discussed the use of AI scribe software for clinical note transcription with the patient, who gave verbal consent to proceed.  History of Present Illness   The patient, with a history of deep vein thrombosis (DVT) and kidney disease, presents with multiple concerns. She experienced a severe bruising episode on the arm about five weeks ago, which has mostly resolved but still shows some residual bruising. The patient reports no specific injury but recalls a sharp pain while opening a bedroom door, followed by swelling and discoloration. An ultrasound was performed, and the patient was switched from warfarin to Eliquis for blood thinning.   She mentions ongoing issues with insomnia, which started after her kidney disease diagnosis. The patient also reports chronic pain in the back and shoulder, which worsens during dialysis sessions.  The patient expresses concern about an enlarged heart, as indicated in a recent ultrasound of the heart. She reports an upcoming appointment with a cardiologist to discuss this issue further. The patient also mentions her husband's memory issues, which have not been officially diagnosed but are causing concern.        Past Medical History:  Diagnosis Date   Advanced care planning/counseling discussion 06/10/2014   05/31/2014 patient presents copy of HCP and Living Will   Anemia    iron deficiency   Anxiety    Arthritis    "in hands"   Atrial fibrillation (HCC)    Benign fundic gland polyps of stomach    Bladder polyps 06/25/2010   Chronic headaches 06/24/2010   Complication of anesthesia    "woke up at the end of a cyst removal surgery in 1991"   Depression 1991   hospitalized   Diverticulosis    DVT of right axillary vein, acute (HCC) 08/07/2022   ESRD (end stage renal disease)  (HCC) 11/09/2015   HD on MWF   External prolapsed hemorrhoids    History of chicken pox 06/25/2010   Hypercalcemia 02/18/2014   Hypertension    Insomnia 06/24/2010   Multiple chemical sensitivity syndrome 06/25/2010   Proteinuria 02/18/2014   Renal insufficiency 03/26/2011   Valvular heart disease 04/28/2016    Past Surgical History:  Procedure Laterality Date   AV FISTULA PLACEMENT Left    x4   COLONOSCOPY  2014   cyst on left breast removed Left 1991   benign   ESOPHAGOGASTRODUODENOSCOPY (EGD) WITH ESOPHAGEAL DILATION  2014   LAPAROSCOPIC BILATERAL SALPINGO OOPHERECTOMY Bilateral 08/20/2020   Procedure: LAPAROSCOPIC BILATERAL SALPINGO OOPHORECTOMY;  Surgeon: Jerene Bears, MD;  Location: [facility] OR;  Service: Gynecology;  Laterality: Bilateral;   NASAL SEPTUM SURGERY  1986   rhinoplasty   polyps on bladder removed  1972   benign   RIGHT HEART CATH N/A 04/10/2021   Procedure: RIGHT HEART CATH;  Surgeon: Laurey Morale, MD;  Location: [facility] INVASIVE CV LAB;  Service: Cardiovascular;  Laterality: N/A;   TONSILLECTOMY  1962    Family History  Problem Relation Age of Onset   Breast cancer Mother 27       left breast removed, 2010 lung   Diabetes Mother    Nephrolithiasis Father    Heart attack Father 15       X 3   Heart disease Father  smoker   Hypertension Brother    Allergic rhinitis Brother    Melanoma Son 37       melanoma on leg removed   Pancreatic cancer Maternal Grandmother    Hypertension Paternal Grandmother    Obesity Paternal Grandmother    Heart attack Paternal Grandfather 66   Diabetes Maternal Aunt        x 2   Diabetes Maternal Uncle        x 3   Asthma Neg Hx    Eczema Neg Hx    Urticaria Neg Hx    Immunodeficiency Neg Hx    Angioedema Neg Hx     Social History   Socioeconomic History   Marital status: Married    Spouse name: Not on file   Number of children: 2   Years of education: Not on file   Highest education level: Not on file   Occupational History   Occupation: retired  Tobacco Use   Smoking status: Never   Smokeless tobacco: Never  Vaping Use   Vaping status: Never Used  Substance and Sexual Activity   Alcohol use: No   Drug use: No   Sexual activity: Yes    Partners: Male  Other Topics Concern   Not on file  Social History Narrative   Married and lives with husband.     Retired   1 son and 1 daughter   No alcohol tobacco or caffeine   Social Determinants of Health   Financial Resource Strain: Low Risk  (01/30/2021)   Overall Financial Resource Strain (CARDIA)    Difficulty of Paying Living Expenses: Not hard at all  Food Insecurity: No Food Insecurity (07/07/2022)   Hunger Vital Sign    Worried About Running Out of Food in the Last Year: Never true    Ran Out of Food in the Last Year: Never true  Transportation Needs: No Transportation Needs (07/07/2022)   PRAPARE - Administrator, Civil Service (Medical): No    Lack of Transportation (Non-Medical): No  Physical Activity: Insufficiently Active (01/30/2021)   Exercise Vital Sign    Days of Exercise per Week: 7 days    Minutes of Exercise per Session: 20 min  Stress: No Stress Concern Present (01/30/2021)   [employer] of Occupational Health - Occupational Stress Questionnaire    Feeling of Stress : Not at all  Social Connections: Moderately Integrated (01/30/2021)   Social Connection and Isolation Panel [NHANES]    Frequency of Communication with Friends and Family: More than three times a week    Frequency of Social Gatherings with Friends and Family: More than three times a week    Attends Religious Services: More than 4 times per year    Active Member of [employer] or Organizations: No    Attends Banker Meetings: Never    Marital Status: Married  Catering manager Violence: Not At Risk (05/05/2022)   Humiliation, Afraid, Rape, and Kick questionnaire    Fear of Current or Ex-Partner: No    Emotionally Abused: No     Physically Abused: No    Sexually Abused: No    Outpatient Medications Prior to Visit  Medication Sig Dispense Refill   acetaminophen (TYLENOL) 500 MG tablet Take 500 mg by mouth every 6 (six) hours as needed for headache (pain).     Alfalfa 500 MG TABS Take 500 mg by mouth 3 (three) times daily.     apixaban (ELIQUIS) 5 MG TABS tablet Take  1 tablet (5 mg total) by mouth 2 (two) times daily. 60 tablet 6   atorvastatin (LIPITOR) 20 MG tablet Take 20 mg by mouth at bedtime.     carvedilol (COREG) 25 MG tablet Take 1 tablet by mouth twice daily 180 tablet 0   cinacalcet (SENSIPAR) 30 MG tablet Take 30 mg by mouth at bedtime.     diltiazem (CARDIZEM CD) 240 MG 24 hr capsule Take 240 mg by mouth daily with lunch.     hydrALAZINE (APRESOLINE) 50 MG tablet Take 75 mg by mouth 2 (two) times daily.     L-Glutamine 500 MG CAPS Take 500 mg by mouth every evening.     lidocaine 4 % Place 1 patch onto the skin daily. 10 patch 1   lidocaine-prilocaine (EMLA) cream Apply 1 application  topically every Monday, Wednesday, and Friday with hemodialysis.     Misc Natural Products (SAMBUCUS ELDERBERRY IMMUNE) CHEW Chew 1 capsule by mouth daily.     OVER THE COUNTER MEDICATION Take 3 capsules by mouth every evening. Shaklee Herb-Lax Natural Laxative for Colon Cleanse (Senna, Licorice, and Alfalfa)     sevelamer carbonate (RENVELA) 800 MG tablet Take 2,400 mg by mouth See admin instructions. Take 3 tablets (2400 mg) by mouth with each meal & take 2 tablets (1600 mg) by mouth with each large snack     vitamin C (ASCORBIC ACID) 500 MG tablet Take 500 mg by mouth daily.     triamcinolone acetonide (KENALOG-40) injection 40 mg      No facility-administered medications prior to visit.    Allergies  Allergen Reactions   Clonidine Derivatives Palpitations   Sulfa Antibiotics Nausea And Vomiting    Review of Systems  Constitutional:  Positive for malaise/fatigue. Negative for fever.  HENT:  Negative for  congestion.   Eyes:  Negative for blurred vision.  Respiratory:  Positive for shortness of breath.   Cardiovascular:  Negative for chest pain, palpitations and leg swelling.  Gastrointestinal:  Negative for abdominal pain, blood in stool and nausea.  Genitourinary:  Negative for dysuria and frequency.  Musculoskeletal:  Negative for falls.  Skin:  Negative for rash.  Neurological:  Negative for dizziness, loss of consciousness and headaches.  Endo/Heme/Allergies:  Negative for environmental allergies. Bruises/bleeds easily.  Psychiatric/Behavioral:  Negative for depression. The patient is not nervous/anxious.        Objective:    Physical Exam Constitutional:      General: She is not in acute distress.    Appearance: Normal appearance. She is well-developed. She is not toxic-appearing.  HENT:     Head: Normocephalic and atraumatic.     Right Ear: External ear normal.     Left Ear: External ear normal.     Nose: Nose normal.  Eyes:     General:        Right eye: No discharge.        Left eye: No discharge.     Conjunctiva/sclera: Conjunctivae normal.  Neck:     Thyroid: No thyromegaly.  Cardiovascular:     Rate and Rhythm: Normal rate and regular rhythm.     Heart sounds: Murmur heard.  Pulmonary:     Effort: Pulmonary effort is normal. No respiratory distress.     Breath sounds: Normal breath sounds.  Abdominal:     General: Bowel sounds are normal.     Palpations: Abdomen is soft.     Tenderness: There is no abdominal tenderness. There is no guarding.  Musculoskeletal:  General: Normal range of motion.     Cervical back: Neck supple.     Comments: Fistula left arm  Lymphadenopathy:     Cervical: No cervical adenopathy.  Skin:    General: Skin is warm and dry.     Findings: Bruising present.     Comments: Right arm small patch now photo shows it was the entire arm previously  Neurological:     Mental Status: She is alert and oriented to person, place, and  time.  Psychiatric:        Mood and Affect: Mood normal.        Behavior: Behavior normal.        Thought Content: Thought content normal.        Judgment: Judgment normal.     BP 130/60 (BP Location: Left Arm, Patient Position: Sitting, Cuff Size: Normal)   Pulse 77   Temp 98 F (36.7 C) (Oral)   Resp 16   Ht 5\' 6"  (1.676 m)   Wt 132 lb 3.2 oz (60 kg)   SpO2 95%   BMI 21.34 kg/m  Wt Readings from Last 3 Encounters:  09/08/22 132 lb 3.2 oz (60 kg)  09/03/22 131 lb 12.8 oz (59.8 kg)  08/11/22 131 lb (59.4 kg)    Diabetic Foot Exam - Simple   No data filed    Lab Results  Component Value Date   WBC 6.1 09/03/2022   HGB 10.9 (L) 09/03/2022   HCT 36.0 09/03/2022   PLT 207 09/03/2022   GLUCOSE 111 (H) 09/03/2022   CHOL 157 12/04/2021   TRIG 59.0 12/04/2021   HDL 100.70 12/04/2021   LDLCALC 45 12/04/2021   ALT 12 09/03/2022   AST 19 09/03/2022   NA 139 09/03/2022   K 5.5 (H) 09/03/2022   CL 100 09/03/2022   CREATININE 6.05 (H) 09/03/2022   BUN 32 (H) 09/03/2022   CO2 31 09/03/2022   TSH 0.13 (L) 07/28/2022   INR 1.2 08/07/2022   HGBA1C 4.7 07/28/2022    Lab Results  Component Value Date   TSH 0.13 (L) 07/28/2022   Lab Results  Component Value Date   WBC 6.1 09/03/2022   HGB 10.9 (L) 09/03/2022   HCT 36.0 09/03/2022   MCV 108.4 (H) 09/03/2022   PLT 207 09/03/2022   Lab Results  Component Value Date   NA 139 09/03/2022   K 5.5 (H) 09/03/2022   CHLORIDE 109 03/25/2016   CO2 31 09/03/2022   GLUCOSE 111 (H) 09/03/2022   BUN 32 (H) 09/03/2022   CREATININE 6.05 (H) 09/03/2022   BILITOT 0.4 09/03/2022   ALKPHOS 79 09/03/2022   AST 19 09/03/2022   ALT 12 09/03/2022   PROT 6.0 (L) 09/03/2022   ALBUMIN 3.8 09/03/2022   CALCIUM 9.4 09/03/2022   ANIONGAP 8 09/03/2022   EGFR 8 (L) 04/01/2021   GFR 7.35 (LL) 12/04/2021   Lab Results  Component Value Date   CHOL 157 12/04/2021   Lab Results  Component Value Date   HDL 100.70 12/04/2021   Lab  Results  Component Value Date   LDLCALC 45 12/04/2021   Lab Results  Component Value Date   TRIG 59.0 12/04/2021   Lab Results  Component Value Date   CHOLHDL 2 12/04/2021   Lab Results  Component Value Date   HGBA1C 4.7 07/28/2022       Assessment & Plan:  A-V fistula (HCC) Assessment & Plan: Continues with dialysis 3 x a week  Chronic kidney disease, stage 5 (HCC) Assessment & Plan: Tolerating dialysis   Hypertension, unspecified type Assessment & Plan: Well controlled, no changes to meds. Encouraged heart healthy diet such as the DASH diet and exercise as tolerated.    Osteoporosis, unspecified osteoporosis type, unspecified pathological fracture presence Assessment & Plan: Encouraged to get adequate exercise, calcium and vitamin d intake    Other orders -     tiZANidine HCl; Take 0.5-1 tablets (1-2 mg total) by mouth every 6 (six) hours as needed for muscle spasms.  Dispense: 20 tablet; Refill: 0    Assessment and Plan    Unexplained Bruising Sudden onset of severe bruising and swelling in the arm after opening a door. Resolved over five weeks but not fully. -Continue monitoring for any changes or recurrence.  Deep Vein Thrombosis (DVT) History of DVT in the right internal jugular vein. Recently switched from Warfarin to Eliquis for anticoagulation. -Continue Eliquis as prescribed. -Undergo ultrasound as planned today.   Cardiac Enlargement Recent echocardiogram showed enlargement of both atria and mild leakage in all four heart valves. -Continue current management and follow up with cardiologist on 10/22/2022.  Chronic Back Pain Chronic back pain likely due to arthritis and scoliosis. -Consider returning to Dr. Shon Baton for further treatment. -Trial of Xanax 1-2mg  as needed for pain. -Consider use of lidocaine gel for topical relief.  Insomnia Chronic insomnia, possibly related to kidney disease. -Consider trial of low-dose Xanax for sleep if  desired. -Continue current sleep hygiene practices.  Neurocognitive Testing Concerns about partner's cognitive function. -Refer for neurocognitive testing to evaluate for possible dementia.  Follow-up in 3-4 months for a comprehensive physical examination.         Danise Edge, MD

## 2022-09-10 NOTE — Assessment & Plan Note (Signed)
Well controlled, no changes to meds. Encouraged heart healthy diet such as the DASH diet and exercise as tolerated.  °

## 2022-09-10 NOTE — Assessment & Plan Note (Signed)
Continues with dialysis 3 x a week

## 2022-09-10 NOTE — Assessment & Plan Note (Signed)
Encouraged to get adequate exercise, calcium and vitamin d intake 

## 2022-09-10 NOTE — Assessment & Plan Note (Signed)
Tolerating dialysis.

## 2022-09-11 DIAGNOSIS — D509 Iron deficiency anemia, unspecified: Secondary | ICD-10-CM | POA: Diagnosis not present

## 2022-09-11 DIAGNOSIS — D631 Anemia in chronic kidney disease: Secondary | ICD-10-CM | POA: Diagnosis not present

## 2022-09-11 DIAGNOSIS — N186 End stage renal disease: Secondary | ICD-10-CM | POA: Diagnosis not present

## 2022-09-11 DIAGNOSIS — N2581 Secondary hyperparathyroidism of renal origin: Secondary | ICD-10-CM | POA: Diagnosis not present

## 2022-09-14 DIAGNOSIS — D631 Anemia in chronic kidney disease: Secondary | ICD-10-CM | POA: Diagnosis not present

## 2022-09-14 DIAGNOSIS — D509 Iron deficiency anemia, unspecified: Secondary | ICD-10-CM | POA: Diagnosis not present

## 2022-09-14 DIAGNOSIS — N186 End stage renal disease: Secondary | ICD-10-CM | POA: Diagnosis not present

## 2022-09-14 DIAGNOSIS — N2581 Secondary hyperparathyroidism of renal origin: Secondary | ICD-10-CM | POA: Diagnosis not present

## 2022-09-15 ENCOUNTER — Ambulatory Visit: Payer: Medicare HMO | Admitting: Sports Medicine

## 2022-09-15 ENCOUNTER — Encounter: Payer: Self-pay | Admitting: Sports Medicine

## 2022-09-15 VITALS — BP 167/54 | HR 67

## 2022-09-15 DIAGNOSIS — M7552 Bursitis of left shoulder: Secondary | ICD-10-CM | POA: Diagnosis not present

## 2022-09-15 DIAGNOSIS — G8929 Other chronic pain: Secondary | ICD-10-CM

## 2022-09-15 DIAGNOSIS — M25511 Pain in right shoulder: Secondary | ICD-10-CM

## 2022-09-15 DIAGNOSIS — M25512 Pain in left shoulder: Secondary | ICD-10-CM

## 2022-09-15 MED ORDER — METHYLPREDNISOLONE ACETATE 40 MG/ML IJ SUSP
40.0000 mg | INTRAMUSCULAR | Status: AC | PRN
Start: 2022-09-15 — End: 2022-09-15
  Administered 2022-09-15: 40 mg via INTRA_ARTICULAR

## 2022-09-15 MED ORDER — BUPIVACAINE HCL 0.25 % IJ SOLN
2.0000 mL | INTRAMUSCULAR | Status: AC | PRN
Start: 2022-09-15 — End: 2022-09-15
  Administered 2022-09-15: 2 mL via INTRA_ARTICULAR

## 2022-09-15 MED ORDER — LIDOCAINE HCL 1 % IJ SOLN
2.0000 mL | INTRAMUSCULAR | Status: AC | PRN
Start: 2022-09-15 — End: 2022-09-15
  Administered 2022-09-15: 2 mL

## 2022-09-15 NOTE — Progress Notes (Signed)
Jillian Hayes - 77 y.o. female MRN [id number]  Date of birth: 1945/05/21  Office Visit Note: Visit Date: 09/15/2022 PCP: Bradd Canary, MD Referred by: Bradd Canary, MD  Subjective: Chief Complaint  Patient presents with   Left Shoulder - Pain, Follow-up    Trigger point injections at last ov helped only a couple days, and then the pain returned same as before. Would like to discuss the different injection mentioned at the last ov.    HPI: Jillian Hayes is a pleasant 77 y.o. female who presents today for follow-up of left shoulder blade and shoulder pain.  A few weeks ago we performed trigger point injections around the left medial scapular border and trapezius.  This gave her excellent relief of her pain but and only lasted for for 5 days and then her pain returned.  It is now similar to what it was previously.  She continues with her home exercises for the scapula and shoulders.  He takes Tylenol for her pain.  She does have end-stage renal disease on hemodialysis Monday Wednesday Friday.  Pertinent ROS were reviewed with the patient and found to be negative unless otherwise specified above in HPI.   Assessment & Plan: Visit Diagnoses:  1. Scapulothoracic bursitis of left shoulder   2. Chronic left shoulder pain   3. Chronic right shoulder pain    Plan: Rosemery did have a few days of near complete pain relief from the trigger point injections but unfortunately after few days her pain returned.  I do think she has a degree of scapulothoracic bursitis of the left shoulder.  Through shared decision making did proceed with left shoulder (scapulothoracic bursa) injection, tolerated well. Discussed return precautions.  I would like her to hold from home exercises and lifting with the left upper extremity for the next 2 days and then may return to her.  Okay for Tylenol, heat to the area.  I do think she would benefit from evaluation and treatment for OMT, will send referral to Dr. Aleen Sells.  We will see what sort of improvement she gets from this ST injection and her OMT and she will follow-up as needed.  Follow-up: Return for will send referral to Dr. Aleen Sells for OMT (f/u after as needed).   Meds & Orders: No orders of the defined types were placed in this encounter.  No orders of the defined types were placed in this encounter.    Procedures: Large Joint Inj: L glenohumeral on 09/15/2022 2:07 PM Indications: pain and diagnostic evaluation Details: 25 G 3.5 in and 1.5 in needle, posterior approach Medications: 2 mL lidocaine 1 %; 2 mL bupivacaine 0.25 %; 40 mg methylPREDNISolone acetate 40 MG/ML Outcome: tolerated well, no immediate complications  Technically successful scapulothoracic bursa injection, left shoulder. Needle entry from medial to lateral approach just deep to medial scapula.  Tolerated well without AE's  Procedure, treatment alternatives, risks and benefits explained, specific risks discussed. Consent was given by the patient. Immediately prior to procedure a time out was called to verify the correct patient, procedure, equipment, support staff and site/side marked as required. Patient was prepped and draped in the usual sterile fashion.          Clinical History: No specialty comments available.  She reports that she has never smoked. She has never used smokeless tobacco.  Recent Labs    07/28/22 1246  HGBA1C 4.7    Objective:   Vital Signs: BP (!) 167/54 (BP  Location: Right Arm, Patient Position: Sitting, Cuff Size: Normal)   Pulse 67   Physical Exam  Gen: Well-appearing, in no acute distress; non-toxic CV: Well-perfused. Warm.  Resp: Breathing unlabored on room air; no wheezing. Psych: Fluid speech in conversation; appropriate affect; normal thought process Neuro: Sensation intact throughout. No gross coordination deficits.   Ortho Exam - Left shoulder/scapula: Positive TTP with increased musculature over the medial scapular  border.  There is mild crepitus and grating sensation of the scapula through range of motion.  There is no scapular winging or significant scapular dyskinesia's.  There is some mild pain with protraction and retraction of the scapula.  Imaging: No results found.  Past Medical/Family/Surgical/Social History: Medications & Allergies reviewed per EMR, new medications updated. Patient Active Problem List   Diagnosis Date Noted   DVT of right axillary vein, acute (HCC) 08/07/2022   Swelling of upper arm 08/03/2022   Abnormal INR 08/03/2022   SOB (shortness of breath) 07/28/2022   Urinary hesitancy 07/28/2022   Osteoporosis 07/28/2022   Right shoulder pain 07/27/2022   Dialysis AV fistula malfunction (HCC) 06/30/2022   Upper back pain 05/05/2022   Neck pain 05/05/2022   Hyperglycemia 05/05/2022   Disorder of vitamin B12 05/05/2022   Pulmonary hypertension, primary (HCC) 03/09/2021   Low TSH level 03/09/2021   Right knee pain 03/06/2021   Primary osteoarthritis of right knee 02/25/2021   Bilateral carotid artery stenosis 11/21/2020   Adnexal mass    Left foot drop 02/08/2020   A-V fistula (HCC) 08/03/2019   Nonrheumatic mitral valve regurgitation 06/12/2019   Edema 04/09/2019   Drug-induced constipation 10/18/2018   External prolapsed hemorrhoids 09/25/2018   Malignant hypertension 04/20/2018   Hyperphosphatemia 03/09/2018   Leg swelling 03/03/2018   Long term (current) use of anticoagulants 11/01/2017   Atrial fibrillation (HCC) 10/25/2017   Hyperparathyroidism, unspecified (HCC) 05/04/2017   DDD (degenerative disc disease), lumbar 04/07/2017   Hearing loss 01/04/2017   Hyperlipidemia 01/04/2017   Arthritis of fingers of both hands 10/01/2016   Chronic rhinitis 10/01/2016   Nonrheumatic aortic valve insufficiency 09/29/2016   Bruit 09/29/2016   Palpitation 09/22/2016   Aortic valve disease 04/28/2016   Anemia in ESRD (end-stage renal disease) (HCC) 03/03/2016   Sun-damaged  skin 01/21/2016   ESRD (end stage renal disease) (HCC) 11/09/2015   Chronic kidney disease, stage 5 (HCC) 07/03/2015   Advanced care planning/counseling discussion 06/10/2014   Pedal edema 03/28/2014   Proteinuria 02/18/2014   Hypercalcemia 02/18/2014   Abnormal EKG 01/22/2014   HTN (hypertension) 01/22/2014   Dermatitis 06/30/2013   Abnormal thyroid function test 06/25/2013   Cervical cancer screening 06/10/2012   Preventative health care 11/09/2011   Multiple chemical sensitivity syndrome 06/25/2010   History of chicken pox 06/25/2010   Bladder polyps 06/25/2010   History of cardiac dysrhythmia 06/25/2010   Insomnia 06/24/2010   Fatigue 06/24/2010   Chronic headaches 06/24/2010   Allergy    Anemia    Depression    Past Medical History:  Diagnosis Date   Advanced care planning/counseling discussion 06/10/2014   05/31/2014 patient presents copy of HCP and Living Will   Anemia    iron deficiency   Anxiety    Arthritis    "in hands"   Atrial fibrillation (HCC)    Benign fundic gland polyps of stomach    Bladder polyps 06/25/2010   Chronic headaches 06/24/2010   Complication of anesthesia    "woke up at the end of a cyst removal surgery in  1991"   Depression 1991   hospitalized   Diverticulosis    DVT of right axillary vein, acute (HCC) 08/07/2022   ESRD (end stage renal disease) (HCC) 11/09/2015   HD on MWF   External prolapsed hemorrhoids    History of chicken pox 06/25/2010   Hypercalcemia 02/18/2014   Hypertension    Insomnia 06/24/2010   Multiple chemical sensitivity syndrome 06/25/2010   Proteinuria 02/18/2014   Renal insufficiency 03/26/2011   Valvular heart disease 04/28/2016   Family History  Problem Relation Age of Onset   Breast cancer Mother 79       left breast removed, 2010 lung   Diabetes Mother    Nephrolithiasis Father    Heart attack Father 24       X 3   Heart disease Father        smoker   Hypertension Brother    Allergic rhinitis  Brother    Melanoma Son 37       melanoma on leg removed   Pancreatic cancer Maternal Grandmother    Hypertension Paternal Grandmother    Obesity Paternal Grandmother    Heart attack Paternal Grandfather 43   Diabetes Maternal Aunt        x 2   Diabetes Maternal Uncle        x 3   Asthma Neg Hx    Eczema Neg Hx    Urticaria Neg Hx    Immunodeficiency Neg Hx    Angioedema Neg Hx    Past Surgical History:  Procedure Laterality Date   AV FISTULA PLACEMENT Left    x4   COLONOSCOPY  2014   cyst on left breast removed Left 1991   benign   ESOPHAGOGASTRODUODENOSCOPY (EGD) WITH ESOPHAGEAL DILATION  2014   LAPAROSCOPIC BILATERAL SALPINGO OOPHERECTOMY Bilateral 08/20/2020   Procedure: LAPAROSCOPIC BILATERAL SALPINGO OOPHORECTOMY;  Surgeon: Jerene Bears, MD;  Location: [facility] OR;  Service: Gynecology;  Laterality: Bilateral;   NASAL SEPTUM SURGERY  1986   rhinoplasty   polyps on bladder removed  1972   benign   RIGHT HEART CATH N/A 04/10/2021   Procedure: RIGHT HEART CATH;  Surgeon: Laurey Morale, MD;  Location[facility] INVASIVE CV LAB;  Service: Cardiovascular;  Laterality: N/A;   TONSILLECTOMY  1962   Social History   Occupational History   Occupation: retired  Tobacco Use   Smoking status: Never   Smokeless tobacco: Never  Vaping Use   Vaping status: Never Used  Substance and Sexual Activity   Alcohol use: No   Drug use: No   Sexual activity: Yes    Partners: Male

## 2022-09-16 DIAGNOSIS — N2581 Secondary hyperparathyroidism of renal origin: Secondary | ICD-10-CM | POA: Diagnosis not present

## 2022-09-16 DIAGNOSIS — D631 Anemia in chronic kidney disease: Secondary | ICD-10-CM | POA: Diagnosis not present

## 2022-09-16 DIAGNOSIS — N186 End stage renal disease: Secondary | ICD-10-CM | POA: Diagnosis not present

## 2022-09-16 DIAGNOSIS — D509 Iron deficiency anemia, unspecified: Secondary | ICD-10-CM | POA: Diagnosis not present

## 2022-09-18 DIAGNOSIS — D631 Anemia in chronic kidney disease: Secondary | ICD-10-CM | POA: Diagnosis not present

## 2022-09-18 DIAGNOSIS — N186 End stage renal disease: Secondary | ICD-10-CM | POA: Diagnosis not present

## 2022-09-18 DIAGNOSIS — D509 Iron deficiency anemia, unspecified: Secondary | ICD-10-CM | POA: Diagnosis not present

## 2022-09-18 DIAGNOSIS — N2581 Secondary hyperparathyroidism of renal origin: Secondary | ICD-10-CM | POA: Diagnosis not present

## 2022-09-18 NOTE — Progress Notes (Unsigned)
    Aleen Sells D.[facility] [address] [place] [address] Phone: [phone]   Assessment and Plan:     There are no diagnoses linked to this encounter.  ***   Pertinent previous records reviewed include ***   Follow Up: ***     Subjective:   I, Jillian Hayes, am serving as a Neurosurgeon for Doctor Richardean Sale  Chief Complaint: left shoulder pain   HPI:   09/24/2022 Patient is a 77 year old female complaining of left shoulder pain. Patient states  Relevant Historical Information: ***  Additional pertinent review of systems negative.   Current Outpatient Medications:    acetaminophen (TYLENOL) 500 MG tablet, Take 500 mg by mouth every 6 (six) hours as needed for headache (pain)., Disp: , Rfl:    Alfalfa 500 MG TABS, Take 500 mg by mouth 3 (three) times daily., Disp: , Rfl:    apixaban (ELIQUIS) 5 MG TABS tablet, Take 1 tablet (5 mg total) by mouth 2 (two) times daily., Disp: 60 tablet, Rfl: 6   atorvastatin (LIPITOR) 20 MG tablet, Take 20 mg by mouth at bedtime., Disp: , Rfl:    carvedilol (COREG) 25 MG tablet, Take 1 tablet by mouth twice daily, Disp: 180 tablet, Rfl: 0   cinacalcet (SENSIPAR) 30 MG tablet, Take 30 mg by mouth at bedtime., Disp: , Rfl:    diltiazem (CARDIZEM CD) 240 MG 24 hr capsule, Take 240 mg by mouth daily with lunch., Disp: , Rfl:    hydrALAZINE (APRESOLINE) 50 MG tablet, Take 75 mg by mouth 2 (two) times daily., Disp: , Rfl:    L-Glutamine 500 MG CAPS, Take 500 mg by mouth every evening., Disp: , Rfl:    lidocaine 4 %, Place 1 patch onto the skin daily., Disp: 10 patch, Rfl: 1   lidocaine-prilocaine (EMLA) cream, Apply 1 application  topically every Monday, Wednesday, and Friday with hemodialysis., Disp: , Rfl:    Misc Natural Products (SAMBUCUS ELDERBERRY IMMUNE) CHEW, Chew 1 capsule by mouth daily., Disp: , Rfl:    OVER THE COUNTER MEDICATION, Take 3 capsules by mouth every evening. Shaklee Herb-Lax  Natural Laxative for Colon Cleanse (Senna, Licorice, and Alfalfa), Disp: , Rfl:    sevelamer carbonate (RENVELA) 800 MG tablet, Take 2,400 mg by mouth See admin instructions. Take 3 tablets (2400 mg) by mouth with each meal & take 2 tablets (1600 mg) by mouth with each large snack, Disp: , Rfl:    tiZANidine (ZANAFLEX) 2 MG tablet, Take 0.5-1 tablets (1-2 mg total) by mouth every 6 (six) hours as needed for muscle spasms., Disp: 20 tablet, Rfl: 0   vitamin C (ASCORBIC ACID) 500 MG tablet, Take 500 mg by mouth daily., Disp: , Rfl:    Objective:     There were no vitals filed for this visit.    There is no height or weight on file to calculate BMI.    Physical Exam:    ***   Electronically signed by:  Aleen Sells [facility] 7:42 AM 09/18/22

## 2022-09-21 DIAGNOSIS — N2581 Secondary hyperparathyroidism of renal origin: Secondary | ICD-10-CM | POA: Diagnosis not present

## 2022-09-21 DIAGNOSIS — N186 End stage renal disease: Secondary | ICD-10-CM | POA: Diagnosis not present

## 2022-09-21 DIAGNOSIS — D509 Iron deficiency anemia, unspecified: Secondary | ICD-10-CM | POA: Diagnosis not present

## 2022-09-21 DIAGNOSIS — D631 Anemia in chronic kidney disease: Secondary | ICD-10-CM | POA: Diagnosis not present

## 2022-09-23 DIAGNOSIS — N2581 Secondary hyperparathyroidism of renal origin: Secondary | ICD-10-CM | POA: Diagnosis not present

## 2022-09-23 DIAGNOSIS — D509 Iron deficiency anemia, unspecified: Secondary | ICD-10-CM | POA: Diagnosis not present

## 2022-09-23 DIAGNOSIS — N186 End stage renal disease: Secondary | ICD-10-CM | POA: Diagnosis not present

## 2022-09-23 DIAGNOSIS — D631 Anemia in chronic kidney disease: Secondary | ICD-10-CM | POA: Diagnosis not present

## 2022-09-24 ENCOUNTER — Ambulatory Visit (INDEPENDENT_AMBULATORY_CARE_PROVIDER_SITE_OTHER): Payer: Medicare HMO | Admitting: Sports Medicine

## 2022-09-24 VITALS — BP 142/60 | HR 70 | Ht 66.0 in | Wt 130.0 lb

## 2022-09-24 DIAGNOSIS — S43395A Dislocation of other parts of left shoulder girdle, initial encounter: Secondary | ICD-10-CM

## 2022-09-24 DIAGNOSIS — M7552 Bursitis of left shoulder: Secondary | ICD-10-CM | POA: Diagnosis not present

## 2022-09-24 DIAGNOSIS — M9902 Segmental and somatic dysfunction of thoracic region: Secondary | ICD-10-CM

## 2022-09-24 DIAGNOSIS — G2589 Other specified extrapyramidal and movement disorders: Secondary | ICD-10-CM

## 2022-09-24 DIAGNOSIS — M9908 Segmental and somatic dysfunction of rib cage: Secondary | ICD-10-CM

## 2022-09-24 DIAGNOSIS — M4124 Other idiopathic scoliosis, thoracic region: Secondary | ICD-10-CM | POA: Diagnosis not present

## 2022-09-24 DIAGNOSIS — M9907 Segmental and somatic dysfunction of upper extremity: Secondary | ICD-10-CM

## 2022-09-24 NOTE — Patient Instructions (Addendum)
Good to see you  Scapular exercises given today Referral to physical therapy placed Follow up in 4 weeks

## 2022-09-25 ENCOUNTER — Other Ambulatory Visit: Payer: Self-pay | Admitting: Cardiology

## 2022-09-25 DIAGNOSIS — N2581 Secondary hyperparathyroidism of renal origin: Secondary | ICD-10-CM | POA: Diagnosis not present

## 2022-09-25 DIAGNOSIS — D631 Anemia in chronic kidney disease: Secondary | ICD-10-CM | POA: Diagnosis not present

## 2022-09-25 DIAGNOSIS — N186 End stage renal disease: Secondary | ICD-10-CM | POA: Diagnosis not present

## 2022-09-25 DIAGNOSIS — D509 Iron deficiency anemia, unspecified: Secondary | ICD-10-CM | POA: Diagnosis not present

## 2022-09-28 DIAGNOSIS — N2581 Secondary hyperparathyroidism of renal origin: Secondary | ICD-10-CM | POA: Diagnosis not present

## 2022-09-28 DIAGNOSIS — D631 Anemia in chronic kidney disease: Secondary | ICD-10-CM | POA: Diagnosis not present

## 2022-09-28 DIAGNOSIS — D509 Iron deficiency anemia, unspecified: Secondary | ICD-10-CM | POA: Diagnosis not present

## 2022-09-28 DIAGNOSIS — N186 End stage renal disease: Secondary | ICD-10-CM | POA: Diagnosis not present

## 2022-09-30 ENCOUNTER — Telehealth: Payer: Self-pay | Admitting: *Deleted

## 2022-09-30 DIAGNOSIS — N2581 Secondary hyperparathyroidism of renal origin: Secondary | ICD-10-CM | POA: Diagnosis not present

## 2022-09-30 DIAGNOSIS — D509 Iron deficiency anemia, unspecified: Secondary | ICD-10-CM | POA: Diagnosis not present

## 2022-09-30 DIAGNOSIS — N186 End stage renal disease: Secondary | ICD-10-CM | POA: Diagnosis not present

## 2022-09-30 DIAGNOSIS — D631 Anemia in chronic kidney disease: Secondary | ICD-10-CM | POA: Diagnosis not present

## 2022-09-30 NOTE — Progress Notes (Signed)
  Care Coordination Note  09/30/2022 Name: Jillian Hayes MRN: [id number] DOB: 08-18-1945  Jillian Hayes is a 77 y.o. year old female who is a primary care patient of Bradd Canary, MD and is actively engaged with the care management team. I reached out to Cyndia Skeeters by phone today to assist with re-scheduling a follow up visit with the RN Case Manager  Follow up plan: We have been unable to make contact with the patient for follow up.   Burman Nieves, CCMA Care Coordination Care Guide Direct Dial: [phone]

## 2022-10-02 DIAGNOSIS — D631 Anemia in chronic kidney disease: Secondary | ICD-10-CM | POA: Diagnosis not present

## 2022-10-02 DIAGNOSIS — N2581 Secondary hyperparathyroidism of renal origin: Secondary | ICD-10-CM | POA: Diagnosis not present

## 2022-10-02 DIAGNOSIS — N186 End stage renal disease: Secondary | ICD-10-CM | POA: Diagnosis not present

## 2022-10-02 DIAGNOSIS — D509 Iron deficiency anemia, unspecified: Secondary | ICD-10-CM | POA: Diagnosis not present

## 2022-10-03 DIAGNOSIS — N186 End stage renal disease: Secondary | ICD-10-CM | POA: Diagnosis not present

## 2022-10-03 DIAGNOSIS — Z992 Dependence on renal dialysis: Secondary | ICD-10-CM | POA: Diagnosis not present

## 2022-10-08 ENCOUNTER — Ambulatory Visit: Payer: Medicare HMO | Attending: Sports Medicine

## 2022-10-08 ENCOUNTER — Other Ambulatory Visit: Payer: Self-pay

## 2022-10-08 DIAGNOSIS — M62830 Muscle spasm of back: Secondary | ICD-10-CM | POA: Insufficient documentation

## 2022-10-08 DIAGNOSIS — M6281 Muscle weakness (generalized): Secondary | ICD-10-CM | POA: Insufficient documentation

## 2022-10-08 DIAGNOSIS — H52223 Regular astigmatism, bilateral: Secondary | ICD-10-CM | POA: Diagnosis not present

## 2022-10-08 DIAGNOSIS — H524 Presbyopia: Secondary | ICD-10-CM | POA: Diagnosis not present

## 2022-10-08 DIAGNOSIS — M9902 Segmental and somatic dysfunction of thoracic region: Secondary | ICD-10-CM | POA: Insufficient documentation

## 2022-10-08 DIAGNOSIS — M9908 Segmental and somatic dysfunction of rib cage: Secondary | ICD-10-CM | POA: Insufficient documentation

## 2022-10-08 DIAGNOSIS — R293 Abnormal posture: Secondary | ICD-10-CM | POA: Insufficient documentation

## 2022-10-08 DIAGNOSIS — G2589 Other specified extrapyramidal and movement disorders: Secondary | ICD-10-CM | POA: Diagnosis not present

## 2022-10-08 DIAGNOSIS — M7552 Bursitis of left shoulder: Secondary | ICD-10-CM | POA: Insufficient documentation

## 2022-10-08 DIAGNOSIS — M5459 Other low back pain: Secondary | ICD-10-CM | POA: Diagnosis not present

## 2022-10-08 DIAGNOSIS — M546 Pain in thoracic spine: Secondary | ICD-10-CM | POA: Diagnosis not present

## 2022-10-08 DIAGNOSIS — M4124 Other idiopathic scoliosis, thoracic region: Secondary | ICD-10-CM | POA: Diagnosis not present

## 2022-10-08 NOTE — Therapy (Signed)
OUTPATIENT PHYSICAL THERAPY THORACOLUMBAR EVALUATION   Patient Name: Jillian Hayes MRN: [id number] DOB:27-Jul-1945, 77 y.o., female Today's Date: 10/08/2022  END OF SESSION:  PT End of Session - 10/08/22 1715     Visit Number 1    Date for PT Re-Evaluation 12/03/22    Progress Note Due on Visit 10    PT Start Time 1400    PT Stop Time 1445    PT Time Calculation (min) 45 min    Activity Tolerance Patient limited by pain;Patient tolerated treatment well    Behavior During Therapy [facility] for tasks assessed/performed             Past Medical History:  Diagnosis Date   Advanced care planning/counseling discussion 06/10/2014   05/31/2014 patient presents copy of HCP and Living Will   Anemia    iron deficiency   Anxiety    Arthritis    "in hands"   Atrial fibrillation (HCC)    Benign fundic gland polyps of stomach    Bladder polyps 06/25/2010   Chronic headaches 06/24/2010   Complication of anesthesia    "woke up at the end of a cyst removal surgery in 1991"   Depression 1991   hospitalized   Diverticulosis    DVT of right axillary vein, acute (HCC) 08/07/2022   ESRD (end stage renal disease) (HCC) 11/09/2015   HD on MWF   External prolapsed hemorrhoids    History of chicken pox 06/25/2010   Hypercalcemia 02/18/2014   Hypertension    Insomnia 06/24/2010   Multiple chemical sensitivity syndrome 06/25/2010   Proteinuria 02/18/2014   Renal insufficiency 03/26/2011   Valvular heart disease 04/28/2016   Past Surgical History:  Procedure Laterality Date   AV FISTULA PLACEMENT Left    x4   COLONOSCOPY  2014   cyst on left breast removed Left 1991   benign   ESOPHAGOGASTRODUODENOSCOPY (EGD) WITH ESOPHAGEAL DILATION  2014   LAPAROSCOPIC BILATERAL SALPINGO OOPHERECTOMY Bilateral 08/20/2020   Procedure: LAPAROSCOPIC BILATERAL SALPINGO OOPHORECTOMY;  Surgeon: Jerene Bears, MD;  Location: [facility];  Service: Gynecology;  Laterality: Bilateral;   NASAL SEPTUM SURGERY  1986    rhinoplasty   polyps on bladder removed  1972   benign   RIGHT HEART CATH N/A 04/10/2021   Procedure: RIGHT HEART CATH;  Surgeon: Laurey Morale, MD;  Location: [facility] INVASIVE CV LAB;  Service: Cardiovascular;  Laterality: N/A;   TONSILLECTOMY  1962   Patient Active Problem List   Diagnosis Date Noted   DVT of right axillary vein, acute (HCC) 08/07/2022   Swelling of upper arm 08/03/2022   Abnormal INR 08/03/2022   SOB (shortness of breath) 07/28/2022   Urinary hesitancy 07/28/2022   Osteoporosis 07/28/2022   Right shoulder pain 07/27/2022   Dialysis AV fistula malfunction (HCC) 06/30/2022   Upper back pain 05/05/2022   Neck pain 05/05/2022   Hyperglycemia 05/05/2022   Disorder of vitamin B12 05/05/2022   Pulmonary hypertension, primary (HCC) 03/09/2021   Low TSH level 03/09/2021   Right knee pain 03/06/2021   Primary osteoarthritis of right knee 02/25/2021   Bilateral carotid artery stenosis 11/21/2020   Adnexal mass    Left foot drop 02/08/2020   A-V fistula (HCC) 08/03/2019   Nonrheumatic mitral valve regurgitation 06/12/2019   Edema 04/09/2019   Drug-induced constipation 10/18/2018   External prolapsed hemorrhoids 09/25/2018   Malignant hypertension 04/20/2018   Hyperphosphatemia 03/09/2018   Leg swelling 03/03/2018   Long term (current) use of anticoagulants 11/01/2017  Atrial fibrillation (HCC) 10/25/2017   Hyperparathyroidism, unspecified (HCC) 05/04/2017   DDD (degenerative disc disease), lumbar 04/07/2017   Hearing loss 01/04/2017   Hyperlipidemia 01/04/2017   Arthritis of fingers of both hands 10/01/2016   Chronic rhinitis 10/01/2016   Nonrheumatic aortic valve insufficiency 09/29/2016   Bruit 09/29/2016   Palpitation 09/22/2016   Aortic valve disease 04/28/2016   Anemia in ESRD (end-stage renal disease) (HCC) 03/03/2016   Sun-damaged skin 01/21/2016   ESRD (end stage renal disease) (HCC) 11/09/2015   Chronic kidney disease, stage 5 (HCC) 07/03/2015    Advanced care planning/counseling discussion 06/10/2014   Pedal edema 03/28/2014   Proteinuria 02/18/2014   Hypercalcemia 02/18/2014   Abnormal EKG 01/22/2014   HTN (hypertension) 01/22/2014   Dermatitis 06/30/2013   Abnormal thyroid function test 06/25/2013   Cervical cancer screening 06/10/2012   Preventative health care 11/09/2011   Multiple chemical sensitivity syndrome 06/25/2010   History of chicken pox 06/25/2010   Bladder polyps 06/25/2010   History of cardiac dysrhythmia 06/25/2010   Insomnia 06/24/2010   Fatigue 06/24/2010   Chronic headaches 06/24/2010   Allergy    Anemia    Depression     PCP: Reuel Derby, MD  REFERRING PROVIDER: Richardean Sale, DO  REFERRING DIAG: scapulothoracic bursitis L  Rationale for Evaluation and Treatment: Rehabilitation  THERAPY DIAG:  Pain in thoracic spine  Abnormal posture  Muscle spasm of back  ONSET DATE: chronic  SUBJECTIVE:                                                                                                                                                                                           SUBJECTIVE STATEMENT: Have tried everything to get rid of pain , when the sports medicine doctor worked with me it hurt but it helped, the worst time is trying to sleep, have to sleep on ice   PERTINENT HISTORY:  Chronic L periscapular pain x 1 year after driving to visit family. Has tried chiropractic, injections, no relief.  Wants to try dry needling again.  Unable to tolerate sleeping or sitting in dialysis chair  PAIN:  Are you having pain? Yes: NPRS scale: 7-9/10 Pain location: L medial scapular border, now radiating into L lateral lower ribs Pain description: constant worse sitting soft chair and sleep Aggravating factors: see above Relieving factors: ice  PRECAUTIONS: Other: osteoporosis  RED FLAGS: None   WEIGHT BEARING RESTRICTIONS: No  FALLS:  Has patient fallen in last 6 months? No  LIVING  ENVIRONMENT: Lives with: lives with their family Lives in: House/apartment Stairs: No Has following equipment at home: None  OCCUPATION: retired  PLOF: Independent  PATIENT GOALS: sleep better   NEXT MD VISIT: 2 weeks  OBJECTIVE:   DIAGNOSTIC FINDINGS:  na  PATIENT SURVEYS:  Modified Oswestry 25/50   SCREENING FOR RED FLAGS:scapulothoracic bursitis L Bowel or bladder incontinence: No Spinal tumors: No Cauda equina syndrome: No Compression fracture: No Abdominal aneurysm: No  COGNITION: Overall cognitive status: Within functional limits for tasks assessed     SENSATION: WFL  POSTURE:  scoliosis, L shoulder elevated, slight rotation to R thoracic spine  PALPATION: "Sore" tender L lateral thoracic paraspinals, periscapular, supraspinatus PA segmental testing thoracic spine decreased mobility T 3, T 7. Lateral and post ribs with normal mobility, tender on L post lower ribs  LUMBAR ROM: NT Thoracic ROM wfl  all directions  Cervical spine wfl all directions Decreased movement noted with isolation of L 2st rib, seated cervical flex/SB   B shoulders wfl but R sholder with poor mechanics for final 15 degrees of movement MMT: R shoulder flex, abd 4/5 Otherwise symmetrical , wfl   GAIT: Distance walked: 68' in clnic, wnl  TODAY'S TREATMENT:                                                                                                                              DATE: 10/08/22: HVLA for cervicothoracic jxn B and in supine for  T 2 Side lying for rib recoil muscle energy each side  Kinesiotaping 2 pieces in x pattern extending from post shoulder to just superior to opposite iliac crest  PATIENT EDUCATION:  Education details: POC, goals Person educated: Patient Education method: Explanation, Demonstration, Tactile cues, and Verbal cues Education comprehension: verbalized understanding and returned demonstration  HOME EXERCISE  PROGRAM: na  ASSESSMENT:  CLINICAL IMPRESSION: Patient is a 77 y.o. female who was evaluated today for physical therapy due to chronic scapulothoracic dyskinesia and bursitis on L.  She has attended PT in the past year with minimal, /no improvement. Irritable tissue thoracic region over a diffuse area.  Utilized the kinesiotape to provide some external support and calm the tissue.  Recommend dry needling as well in future appts. She is overall very flexible, reports pain worse with activities including washing dishes, reaching forward.  May benefit from instruction in isometrics thoracic extensors, scapular retractors in next few visits to calm inflammation.  Will utilize trial of PT, 1 x week, plan for 8 weeks but with frequent reassessments.  OBJECTIVE IMPAIRMENTS: decreased activity tolerance, decreased strength, increased fascial restrictions, impaired perceived functional ability, impaired UE functional use, and pain.   ACTIVITY LIMITATIONS: carrying, lifting, standing, sleeping, and reach over head  PARTICIPATION LIMITATIONS: meal prep  PERSONAL FACTORS: Age, Behavior pattern, Past/current experiences, Time since onset of injury/illness/exacerbation, and 3+ comorbidities: ESRD, osteoporosis,   are also affecting patient's functional outcome.   REHAB POTENTIAL: Fair    CLINICAL DECISION MAKING: Evolving/moderate complexity  EVALUATION COMPLEXITY: Moderate   GOALS: Goals reviewed with patient? Yes  SHORT TERM GOALS: Target date:  I HEP  TBD Baseline: Goal status: INITIAL   LONG TERM GOALS: Target date: 12/03/22  Modified Oswestry improve to 15/50 Baseline: 25/50 Goal status: INITIAL  2.  Strength B shoulder horizontal abd(middle traps, and lower traps 5/5) Baseline: TBD Goal status: INITIAL  3.  Sleeping improve to greater than 2 hrs without pain L scapulothoracic region Baseline:  Goal status: INITIAL  PLAN:  PT FREQUENCY: 1x/week  PT DURATION: 8  weeks  PLANNED INTERVENTIONS: Therapeutic exercises, Therapeutic activity, Neuromuscular re-education, Balance training, Gait training, Patient/Family education, Self Care, and Joint mobilization.  PLAN FOR NEXT SESSION: initiate isometrics for cervical, thoracic spine extensors and for scapular retractors, dry needling, reassess tape and reapply, test strength middle and lower traps   Kaoir Loree L Rashell Shambaugh, PT, DPT, OCS 10/08/2022, 5:41 PM

## 2022-10-15 ENCOUNTER — Inpatient Hospital Stay: Payer: Medicare HMO | Admitting: Hematology & Oncology

## 2022-10-15 ENCOUNTER — Inpatient Hospital Stay: Payer: Medicare HMO | Attending: Hematology & Oncology

## 2022-10-19 ENCOUNTER — Other Ambulatory Visit: Payer: Self-pay | Admitting: Cardiology

## 2022-10-20 ENCOUNTER — Encounter: Payer: Self-pay | Admitting: Physical Therapy

## 2022-10-20 ENCOUNTER — Ambulatory Visit: Payer: Medicare HMO | Admitting: Physical Therapy

## 2022-10-20 DIAGNOSIS — M6281 Muscle weakness (generalized): Secondary | ICD-10-CM

## 2022-10-20 DIAGNOSIS — M546 Pain in thoracic spine: Secondary | ICD-10-CM | POA: Diagnosis not present

## 2022-10-20 DIAGNOSIS — M62830 Muscle spasm of back: Secondary | ICD-10-CM | POA: Diagnosis not present

## 2022-10-20 DIAGNOSIS — M5459 Other low back pain: Secondary | ICD-10-CM

## 2022-10-20 DIAGNOSIS — R293 Abnormal posture: Secondary | ICD-10-CM | POA: Diagnosis not present

## 2022-10-20 DIAGNOSIS — M7552 Bursitis of left shoulder: Secondary | ICD-10-CM | POA: Diagnosis not present

## 2022-10-20 DIAGNOSIS — M4124 Other idiopathic scoliosis, thoracic region: Secondary | ICD-10-CM | POA: Diagnosis not present

## 2022-10-20 DIAGNOSIS — M9902 Segmental and somatic dysfunction of thoracic region: Secondary | ICD-10-CM | POA: Diagnosis not present

## 2022-10-20 DIAGNOSIS — M9908 Segmental and somatic dysfunction of rib cage: Secondary | ICD-10-CM | POA: Diagnosis not present

## 2022-10-20 DIAGNOSIS — G2589 Other specified extrapyramidal and movement disorders: Secondary | ICD-10-CM | POA: Diagnosis not present

## 2022-10-20 NOTE — Therapy (Signed)
OUTPATIENT PHYSICAL THERAPY TREATMENT   Patient Name: Jillian Hayes MRN: [id number] DOB:03-19-1945, 77 y.o., female Today's Date: 10/20/2022  END OF SESSION:  PT End of Session - 10/20/22 1537     Visit Number 2    Date for PT Re-Evaluation 12/03/22    Progress Note Due on Visit 10    PT Start Time 1535    PT Stop Time 1617    PT Time Calculation (min) 42 min    Activity Tolerance Patient limited by pain;Patient tolerated treatment well    Behavior During Therapy [facility] for tasks assessed/performed             Past Medical History:  Diagnosis Date   Advanced care planning/counseling discussion 06/10/2014   05/31/2014 patient presents copy of HCP and Living Will   Anemia    iron deficiency   Anxiety    Arthritis    "in hands"   Atrial fibrillation (HCC)    Benign fundic gland polyps of stomach    Bladder polyps 06/25/2010   Chronic headaches 06/24/2010   Complication of anesthesia    "woke up at the end of a cyst removal surgery in 1991"   Depression 1991   hospitalized   Diverticulosis    DVT of right axillary vein, acute (HCC) 08/07/2022   ESRD (end stage renal disease) (HCC) 11/09/2015   HD on MWF   External prolapsed hemorrhoids    History of chicken pox 06/25/2010   Hypercalcemia 02/18/2014   Hypertension    Insomnia 06/24/2010   Multiple chemical sensitivity syndrome 06/25/2010   Proteinuria 02/18/2014   Renal insufficiency 03/26/2011   Valvular heart disease 04/28/2016   Past Surgical History:  Procedure Laterality Date   AV FISTULA PLACEMENT Left    x4   COLONOSCOPY  2014   cyst on left breast removed Left 1991   benign   ESOPHAGOGASTRODUODENOSCOPY (EGD) WITH ESOPHAGEAL DILATION  2014   LAPAROSCOPIC BILATERAL SALPINGO OOPHERECTOMY Bilateral 08/20/2020   Procedure: LAPAROSCOPIC BILATERAL SALPINGO OOPHORECTOMY;  Surgeon: Jerene Bears, MD;  Location: [facility] OR;  Service: Gynecology;  Laterality: Bilateral;   NASAL SEPTUM SURGERY  1986   rhinoplasty    polyps on bladder removed  1972   benign   RIGHT HEART CATH N/A 04/10/2021   Procedure: RIGHT HEART CATH;  Surgeon: Laurey Morale, MD;  Location: [facility] INVASIVE CV LAB;  Service: Cardiovascular;  Laterality: N/A;   TONSILLECTOMY  1962   Patient Active Problem List   Diagnosis Date Noted   DVT of right axillary vein, acute (HCC) 08/07/2022   Swelling of upper arm 08/03/2022   Abnormal INR 08/03/2022   SOB (shortness of breath) 07/28/2022   Urinary hesitancy 07/28/2022   Osteoporosis 07/28/2022   Right shoulder pain 07/27/2022   Dialysis AV fistula malfunction (HCC) 06/30/2022   Upper back pain 05/05/2022   Neck pain 05/05/2022   Hyperglycemia 05/05/2022   Disorder of vitamin B12 05/05/2022   Pulmonary hypertension, primary (HCC) 03/09/2021   Low TSH level 03/09/2021   Right knee pain 03/06/2021   Primary osteoarthritis of right knee 02/25/2021   Bilateral carotid artery stenosis 11/21/2020   Adnexal mass    Left foot drop 02/08/2020   A-V fistula (HCC) 08/03/2019   Nonrheumatic mitral valve regurgitation 06/12/2019   Edema 04/09/2019   Drug-induced constipation 10/18/2018   External prolapsed hemorrhoids 09/25/2018   Malignant hypertension 04/20/2018   Hyperphosphatemia 03/09/2018   Leg swelling 03/03/2018   Long term (current) use of anticoagulants 11/01/2017  Atrial fibrillation (HCC) 10/25/2017   Hyperparathyroidism, unspecified (HCC) 05/04/2017   DDD (degenerative disc disease), lumbar 04/07/2017   Hearing loss 01/04/2017   Hyperlipidemia 01/04/2017   Arthritis of fingers of both hands 10/01/2016   Chronic rhinitis 10/01/2016   Nonrheumatic aortic valve insufficiency 09/29/2016   Bruit 09/29/2016   Palpitation 09/22/2016   Aortic valve disease 04/28/2016   Anemia in ESRD (end-stage renal disease) (HCC) 03/03/2016   Sun-damaged skin 01/21/2016   ESRD (end stage renal disease) (HCC) 11/09/2015   Chronic kidney disease, stage 5 (HCC) 07/03/2015   Advanced care  planning/counseling discussion 06/10/2014   Pedal edema 03/28/2014   Proteinuria 02/18/2014   Hypercalcemia 02/18/2014   Abnormal EKG 01/22/2014   HTN (hypertension) 01/22/2014   Dermatitis 06/30/2013   Abnormal thyroid function test 06/25/2013   Cervical cancer screening 06/10/2012   Preventative health care 11/09/2011   Multiple chemical sensitivity syndrome 06/25/2010   History of chicken pox 06/25/2010   Bladder polyps 06/25/2010   History of cardiac dysrhythmia 06/25/2010   Insomnia 06/24/2010   Fatigue 06/24/2010   Chronic headaches 06/24/2010   Allergy    Anemia    Depression     PCP: Reuel Derby, MD  REFERRING PROVIDER: Richardean Sale, DO  REFERRING DIAG: scapulothoracic bursitis L  Rationale for Evaluation and Treatment: Rehabilitation  THERAPY DIAG:  Pain in thoracic spine  Abnormal posture  Muscle spasm of back  Muscle weakness (generalized)  Other low back pain  ONSET DATE: chronic  SUBJECTIVE:                                                                                                                                                                                           SUBJECTIVE STATEMENT: Still having a lot of pain, she liked the tape but it came of the first night in the shower, she has been trying to tape herself.  She just aches at night and at dialysis.    PERTINENT HISTORY:  Chronic L periscapular pain x 1 year after driving to visit family. Has tried chiropractic, injections, no relief.  Wants to try dry needling again.  Unable to tolerate sleeping or sitting in dialysis chair  PAIN:  Are you having pain? Yes: NPRS scale: 5/10 Pain location: L medial scapular border, now radiating into L lateral lower ribs Pain description: constant worse sitting soft chair and sleep Aggravating factors: see above Relieving factors: ice  PRECAUTIONS: Other: osteoporosis  RED FLAGS: None   WEIGHT BEARING RESTRICTIONS: No  FALLS:  Has  patient fallen in last 6 months? No  LIVING ENVIRONMENT: Lives with: lives with their family Lives in: House/apartment  Stairs: No Has following equipment at home: None  OCCUPATION: retired  PLOF: Independent  PATIENT GOALS: sleep better   NEXT MD VISIT: 2 weeks  OBJECTIVE:   DIAGNOSTIC FINDINGS:  na  PATIENT SURVEYS:  Modified Oswestry 25/50   SCREENING FOR RED FLAGS:scapulothoracic bursitis L Bowel or bladder incontinence: No Spinal tumors: No Cauda equina syndrome: No Compression fracture: No Abdominal aneurysm: No  COGNITION: Overall cognitive status: Within functional limits for tasks assessed     SENSATION: WFL  POSTURE:  scoliosis, L shoulder elevated, slight rotation to R thoracic spine  PALPATION: "Sore" tender L lateral thoracic paraspinals, periscapular, supraspinatus PA segmental testing thoracic spine decreased mobility T 3, T 7. Lateral and post ribs with normal mobility, tender on L post lower ribs  LUMBAR ROM: NT Thoracic ROM wfl  all directions  Cervical spine wfl all directions Decreased movement noted with isolation of L 2st rib, seated cervical flex/SB   B shoulders wfl but R sholder with poor mechanics for final 15 degrees of movement MMT: R shoulder flex, abd 4/5 Otherwise symmetrical , wfl   GAIT: Distance walked: 9' in clnic, wnl  TODAY'S TREATMENT:                                                                                                                              DATE:   10/20/22: Manual Therapy: to decrease muscle spasm and pain and improve mobility STM/TPR to cervical paraspinals, bil UT, L L/S rhomboids, subscap, supraspinatus, infraspinatus, latissimus.   Kinesiotaping 2 pieces in x pattern extending from post shoulder to just superior to opposite iliac crest.  Skilled palpation and monitoring during dry needling. Trigger Point Dry-Needling  Treatment instructions: Expect mild to moderate muscle soreness. S/S of  pneumothorax if dry needled over a lung field, and to seek immediate medical attention should they occur. Patient verbalized understanding of these instructions and education. Patient Consent Given: Yes Education handout provided: Previously provided Muscles treated: L UT, LS, supraspinatus, infraspinatus, Latissimus Electrical stimulation performed: No Parameters: N/A Treatment response/outcome: Twitch Response Elicited and Palpable Increase in Muscle Length Self Care: Education on how to tape - anchor points, stretch, activation of tape using body heat, review of post -TrDN - gentle movements, extra water as permitted, tylenol as permitted, review of previous HEP.   10/08/22: HVLA for cervicothoracic jxn B and in supine for  T 2 Side lying for rib recoil muscle energy each side  Kinesiotaping 2 pieces in x pattern extending from post shoulder to just superior to opposite iliac crest  PATIENT EDUCATION:  Education details: see self care Person educated: Patient Education method: Explanation and Demonstration Education comprehension: verbalized understanding and returned demonstration  HOME EXERCISE PROGRAM: na  ASSESSMENT:  CLINICAL IMPRESSION: Jillian Hayes returned today reporting continued L shoulder pain but good temporary pain relief with taping however came off same day as eval.  Focused today on manual therapy including TrDN with good twitch response followed  by KT taping, also instructed in taping as well.  Jillian Hayes continues to demonstrate potential for improvement and would benefit from continued skilled therapy to address impairments.     OBJECTIVE IMPAIRMENTS: decreased activity tolerance, decreased strength, increased fascial restrictions, impaired perceived functional ability, impaired UE functional use, and pain.   ACTIVITY LIMITATIONS: carrying, lifting, standing, sleeping, and reach over head  PARTICIPATION LIMITATIONS: meal prep  PERSONAL FACTORS: Age,  Behavior pattern, Past/current experiences, Time since onset of injury/illness/exacerbation, and 3+ comorbidities: ESRD, osteoporosis,   are also affecting patient's functional outcome.   REHAB POTENTIAL: Fair    CLINICAL DECISION MAKING: Evolving/moderate complexity  EVALUATION COMPLEXITY: Moderate   GOALS: Goals reviewed with patient? Yes  SHORT TERM GOALS: Target date: I HEP  TBD Baseline: Goal status: INITIAL   LONG TERM GOALS: Target date: 12/03/22  Modified Oswestry improve to 15/50 Baseline: 25/50 Goal status: INITIAL  2.  Strength B shoulder horizontal abd(middle traps, and lower traps 5/5) Baseline: TBD Goal status: INITIAL  3.  Sleeping improve to greater than 2 hrs without pain L scapulothoracic region Baseline:  Goal status: INITIAL  PLAN:  PT FREQUENCY: 1x/week  PT DURATION: 8 weeks  PLANNED INTERVENTIONS: Therapeutic exercises, Therapeutic activity, Neuromuscular re-education, Balance training, Gait training, Patient/Family education, Self Care, and Joint mobilization.  PLAN FOR NEXT SESSION: initiate isometrics for cervical, thoracic spine extensors and for scapular retractors, dry needling, reassess tape and reapply, test strength middle and lower traps   Jena Gauss, PT, DPT  10/20/2022, 5:37 PM

## 2022-10-21 NOTE — Progress Notes (Unsigned)
Cardiology Office Note:   Date:  10/22/2022  ID:  Jillian Hayes, DOB 09/12/1945, MRN [id number] PCP: Bradd Canary, MD  [facility] Providers Cardiologist:  Rollene Rotunda, MD {  History of Present Illness:   Jillian Hayes is a 77 y.o. female who presents for follow up of HTN.   She had an abnormal EKG and borderline POET (Plain Old Exercise Treadmill).  I sent her for a perfusion study which was negative for ischemia.  She was admitted 9/23-9/25/2019 for atrial fibrillation, RVR.  She spontaneously converted to sinus rhythm and was started on Cardizem and Coumadin.  Echo showed mod MR and AI.   She had a right heart cath with borderline pulmonary HTN.   Since I last saw her she had increased SOB.  However, she saw Jillian Fabian NP and had no acute findings.  She had an echocardiogram which demonstrated well-preserved ejection fraction.  There was moderate to mild AI.  The pulmonary hypertension previously noted was mild to moderate.  Since I last saw her I reviewed extensive notes because she has had problems with her left upper arm vascular access.  She had to have this revised.  She has not had a Port-A-Cath placed for dialysis and she had a big hematoma with this and thrombosis.  She was on warfarin but was subsequently changed to Eliquis.  She saw oncology.  She is finally recovered from all of this.  She did have an echocardiogram as part of her workup and there was moderate mitral regurgitation.  She has significant left atrial enlargement.  She has right atrial enlargement.  There is a small pericardial effusion.  However, none of this is significantly changed from previous.  From a cardiac standpoint she has not felt her atrial fibrillation.  Her breathing is better than it used to be.  She is not having any new shortness of breath, PND or orthopnea.  She is not having any new palpitations, presyncope or syncope.  She has no weight gain or edema.  ROS: As stated in the HPI and  negative for all other systems.  Studies Reviewed:    EKG:   EKG Interpretation Date/Time:  Thursday October 22 2022 13:35:21 EDT Ventricular Rate:  63 PR Interval:  180 QRS Duration:  106 QT Interval:  422 QTC Calculation: 431 R Axis:   22  Text Interpretation: Sinus rhythm with Premature supraventricular complexes Possible Left atrial enlargement Left ventricular hypertrophy ( Sokolow-Lyon , Cornell product , Romhilt-Estes ) When compared with ECG of 26-Oct-2017 14:14, Premature supraventricular complexes are now Present Confirmed by Rollene Rotunda ([id number]) on 10/22/2022 1:58:35 PM     Risk Assessment/Calculations:    CHA2DS2-VASc Score = 4   This indicates a 4.8% annual risk of stroke. The patient's score is based upon: CHF History: 0 HTN History: 1 Diabetes History: 0 Stroke History: 0 Vascular Disease History: 0 Age Score: 2 Gender Score: 1             Physical Exam:   VS:  BP 128/74 (BP Location: Right Arm, Patient Position: Sitting, Cuff Size: Normal)   Pulse 63   Ht 5\' 6"  (1.676 m)   Wt 135 lb 6.4 oz (61.4 kg)   SpO2 96%   BMI 21.85 kg/m    Wt Readings from Last 3 Encounters:  10/22/22 135 lb 6.4 oz (61.4 kg)  09/24/22 130 lb (59 kg)  09/08/22 132 lb 3.2 oz (60 kg)     GEN: Well nourished,  well developed in no acute distress NECK: No JVD; No carotid bruits CARDIAC: RRR, to and fro continuous systolic murmur, rubs, gallops RESPIRATORY:  Clear to auscultation without rales, wheezing or rhonchi  ABDOMEN: Soft, non-tender, non-distended EXTREMITIES:  No edema; No deformity   ASSESSMENT AND PLAN:   ATRIAL FIB:   [name] has a CHA2DS2 - VASc score of 3.   She has had no symptomatic recurrence.  She is tolerating Eliquis.   HTN:  The blood pressure is at target.  No change in therapy.   MODERATE AI/TR/MR:   This was mild to moderate on echo in Sept 24.  She also has some left atrial enlargement and right atrial enlargement.  I will follow this  clinically and with a repeat echo in July of next year.   PULMONARY HTN:   This was measured as above and probably is class II and mild.  No change in therapy.  PERICARDIAL: I will follow this up in July with repeat echo.         Follow up with me after the echo next year  Signed, Rollene Rotunda, MD

## 2022-10-21 NOTE — Progress Notes (Unsigned)
Jillian Hayes D.[facility] [address] [place] [address] Phone: [phone]   Assessment and Plan:     There are no diagnoses linked to this encounter.  *** - Patient has received relief with OMT in the past.  Elects for repeat OMT today.  Tolerated well per note below. - Decision today to treat with OMT was based on Physical Exam   After verbal consent patient was treated with HVLA (high velocity low amplitude), ME (muscle energy), FPR (flex positional release), ST (soft tissue), PC/PD (Pelvic Compression/ Pelvic Decompression) techniques in cervical, rib, thoracic, lumbar, and pelvic areas. Patient tolerated the procedure well with improvement in symptoms.  Patient educated on potential side effects of soreness and recommended to rest, hydrate, and use Tylenol as needed for pain control.   Pertinent previous records reviewed include ***   Follow Up: ***     Subjective:   I, Jillian Hayes, am serving as a Neurosurgeon for Doctor Jillian Hayes  Chief Complaint: left shoulder pain    HPI:    09/24/2022 Patient is a 77 year old female complaining of left shoulder pain. Patient states she sees Dr. Shon Hayes and she has tried different types of injections to help with her shoulder blade on right side. The injection they either didn't help or didn't last at all. Patient was referred here to see what the next steps are. Patient states the pain started a few months ago when driving to [place] and the pain was only when she would be doing daily activities but would go away with rest. The pain now is constant.   10/22/2022  Patient states   Relevant Historical Information: ESRD on dialysis, atrial fibrillation, history of DVT, on chronic anticoagulation with Eliquis  Additional pertinent review of systems negative.  Current Outpatient Medications  Medication Sig Dispense Refill   acetaminophen (TYLENOL) 500 MG tablet Take 500 mg by mouth every 6 (six) hours  as needed for headache (pain).     Alfalfa 500 MG TABS Take 500 mg by mouth 3 (three) times daily.     apixaban (ELIQUIS) 5 MG TABS tablet Take 1 tablet (5 mg total) by mouth 2 (two) times daily. 60 tablet 6   atorvastatin (LIPITOR) 20 MG tablet Take 20 mg by mouth at bedtime.     carvedilol (COREG) 25 MG tablet Take 1 tablet by mouth twice daily 60 tablet 0   cinacalcet (SENSIPAR) 30 MG tablet Take 30 mg by mouth at bedtime.     diltiazem (CARDIZEM CD) 240 MG 24 hr capsule Take 240 mg by mouth daily with lunch.     hydrALAZINE (APRESOLINE) 50 MG tablet Take 75 mg by mouth 2 (two) times daily.     L-Glutamine 500 MG CAPS Take 500 mg by mouth every evening.     lidocaine 4 % Place 1 patch onto the skin daily. 10 patch 1   lidocaine-prilocaine (EMLA) cream Apply 1 application  topically every Monday, Wednesday, and Friday with hemodialysis.     Misc Natural Products (SAMBUCUS ELDERBERRY IMMUNE) CHEW Chew 1 capsule by mouth daily.     OVER THE COUNTER MEDICATION Take 3 capsules by mouth every evening. Shaklee Herb-Lax Natural Laxative for Colon Cleanse (Senna, Licorice, and Alfalfa)     sevelamer carbonate (RENVELA) 800 MG tablet Take 2,400 mg by mouth See admin instructions. Take 3 tablets (2400 mg) by mouth with each meal & take 2 tablets (1600 mg) by mouth with each large snack  tiZANidine (ZANAFLEX) 2 MG tablet Take 0.5-1 tablets (1-2 mg total) by mouth every 6 (six) hours as needed for muscle spasms. 20 tablet 0   vitamin C (ASCORBIC ACID) 500 MG tablet Take 500 mg by mouth daily.     No current facility-administered medications for this visit.      Objective:     There were no vitals filed for this visit.    There is no height or weight on file to calculate BMI.    Physical Exam:     General: Well-appearing, cooperative, sitting comfortably in no acute distress.   OMT Physical Exam:  ASIS Compression Test: Positive Right Cervical: TTP paraspinal, *** Rib: Bilateral elevated  first rib with TTP Thoracic: TTP paraspinal,*** Lumbar: TTP paraspinal,*** Pelvis: Right anterior innominate  Electronically signed by:  Jillian Hayes D.[facility] 7:16 AM 10/21/22

## 2022-10-22 ENCOUNTER — Encounter: Payer: Self-pay | Admitting: Cardiology

## 2022-10-22 ENCOUNTER — Ambulatory Visit: Payer: Medicare HMO | Admitting: Sports Medicine

## 2022-10-22 ENCOUNTER — Encounter: Payer: Self-pay | Admitting: Sports Medicine

## 2022-10-22 ENCOUNTER — Ambulatory Visit: Payer: Medicare HMO | Attending: Cardiology | Admitting: Cardiology

## 2022-10-22 ENCOUNTER — Ambulatory Visit (INDEPENDENT_AMBULATORY_CARE_PROVIDER_SITE_OTHER): Payer: Medicare HMO | Admitting: Sports Medicine

## 2022-10-22 VITALS — BP 140/60 | HR 68 | Ht 66.0 in | Wt 132.0 lb

## 2022-10-22 VITALS — BP 128/74 | HR 63 | Ht 66.0 in | Wt 135.4 lb

## 2022-10-22 DIAGNOSIS — I480 Paroxysmal atrial fibrillation: Secondary | ICD-10-CM

## 2022-10-22 DIAGNOSIS — I270 Primary pulmonary hypertension: Secondary | ICD-10-CM

## 2022-10-22 DIAGNOSIS — I3139 Other pericardial effusion (noninflammatory): Secondary | ICD-10-CM

## 2022-10-22 DIAGNOSIS — G2589 Other specified extrapyramidal and movement disorders: Secondary | ICD-10-CM | POA: Diagnosis not present

## 2022-10-22 DIAGNOSIS — M9907 Segmental and somatic dysfunction of upper extremity: Secondary | ICD-10-CM

## 2022-10-22 DIAGNOSIS — M7552 Bursitis of left shoulder: Secondary | ICD-10-CM

## 2022-10-22 DIAGNOSIS — M9908 Segmental and somatic dysfunction of rib cage: Secondary | ICD-10-CM | POA: Diagnosis not present

## 2022-10-22 DIAGNOSIS — I359 Nonrheumatic aortic valve disorder, unspecified: Secondary | ICD-10-CM

## 2022-10-22 DIAGNOSIS — M9902 Segmental and somatic dysfunction of thoracic region: Secondary | ICD-10-CM | POA: Diagnosis not present

## 2022-10-22 DIAGNOSIS — G4700 Insomnia, unspecified: Secondary | ICD-10-CM

## 2022-10-22 DIAGNOSIS — M4124 Other idiopathic scoliosis, thoracic region: Secondary | ICD-10-CM | POA: Diagnosis not present

## 2022-10-22 MED ORDER — CARVEDILOL 25 MG PO TABS: 25.0000 mg | ORAL_TABLET | Freq: Two times a day (BID) | ORAL | 3 refills | Status: DC

## 2022-10-22 NOTE — Patient Instructions (Signed)
Medication Instructions:  No Changes *If you need a refill on your cardiac medications before your next appointment, please call your pharmacy*   Lab Work: No Labs If you have labs (blood work) drawn today and your tests are completely normal, you will receive your results only by: MyChart Message (if you have MyChart) OR A paper copy in the mail If you have any lab test that is abnormal or we need to change your treatment, we will call you to review the results.   Testing/Procedures: Have echo done July 2025  Your physician has requested that you have an echocardiogram. Echocardiography is a painless test that uses sound waves to create images of your heart. It provides your doctor with information about the size and shape of your heart and how well your heart's chambers and valves are working. This procedure takes approximately one hour. There are no restrictions for this procedure. Please do NOT wear cologne, perfume, aftershave, or lotions (deodorant is allowed). Please arrive 15 minutes prior to your appointment time.    Follow-Up: At [facility], you and your health needs are our priority.  As part of our continuing mission to provide you with exceptional heart care, we have created designated Provider Care Teams.  These Care Teams include your primary Cardiologist (physician) and Advanced Practice Providers (APPs -  Physician Assistants and Nurse Practitioners) who all work together to provide you with the care you need, when you need it.  We recommend signing up for the patient portal called "MyChart".  Sign up information is provided on this After Visit Summary.  MyChart is used to connect with patients for Virtual Visits (Telemedicine).  Patients are able to view lab/test results, encounter notes, upcoming appointments, etc.  Non-urgent messages can be sent to your provider as well.   To learn more about what you can do with MyChart, go to [email or web address].     Your next appointment:   10 month(s)  Provider:   Rollene Rotunda, MD

## 2022-10-22 NOTE — Progress Notes (Signed)
Established patient visit   Patient: Jillian Hayes   DOB: 1946-01-07   77 y.o. Female  MRN: [id number] Visit Date: 10/23/2022  Today's healthcare provider: Alfredia Ferguson, PA-C   Cc. Left ear numbness  Subjective    HPI  Pt reports numbness of her left ear for several weeks, progressing. Reports history of tympanostomy tubes, and some hearing changes over the last few weeks. She feels a lymph node under her left ear that feels harder than usual. Denies radiation of numbness to the face.  Medications: Outpatient Medications Prior to Visit  Medication Sig   acetaminophen (TYLENOL) 500 MG tablet Take 500 mg by mouth every 6 (six) hours as needed for headache (pain).   Alfalfa 500 MG TABS Take 500 mg by mouth 3 (three) times daily.   apixaban (ELIQUIS) 5 MG TABS tablet Take 1 tablet (5 mg total) by mouth 2 (two) times daily.   atorvastatin (LIPITOR) 20 MG tablet Take 20 mg by mouth at bedtime.   carvedilol (COREG) 25 MG tablet Take 1 tablet (25 mg total) by mouth 2 (two) times daily.   cinacalcet (SENSIPAR) 30 MG tablet Take 30 mg by mouth at bedtime.   diltiazem (CARDIZEM CD) 240 MG 24 hr capsule Take 240 mg by mouth daily with lunch.   hydrALAZINE (APRESOLINE) 50 MG tablet Take 75 mg by mouth 2 (two) times daily.   L-Glutamine 500 MG CAPS Take 500 mg by mouth every evening.   lidocaine 4 % Place 1 patch onto the skin daily.   lidocaine-prilocaine (EMLA) cream Apply 1 application  topically every Monday, Wednesday, and Friday with hemodialysis.   Misc Natural Products (SAMBUCUS ELDERBERRY IMMUNE) CHEW Chew 1 capsule by mouth daily.   OVER THE COUNTER MEDICATION Take 3 capsules by mouth every evening. Shaklee Herb-Lax Natural Laxative for Colon Cleanse (Senna, Licorice, and Alfalfa)   sevelamer carbonate (RENVELA) 800 MG tablet Take 2,400 mg by mouth See admin instructions. Take 3 tablets (2400 mg) by mouth with each meal & take 2 tablets (1600 mg) by mouth with each large snack    vitamin C (ASCORBIC ACID) 500 MG tablet Take 500 mg by mouth daily.   tiZANidine (ZANAFLEX) 2 MG tablet Take 0.5-1 tablets (1-2 mg total) by mouth every 6 (six) hours as needed for muscle spasms. (Patient not taking: Reported on 10/23/2022)   No facility-administered medications prior to visit.    Review of Systems  Constitutional:  Negative for fatigue and fever.  Respiratory:  Negative for cough and shortness of breath.   Cardiovascular:  Negative for chest pain and leg swelling.  Gastrointestinal:  Negative for abdominal pain.  Neurological:  Positive for numbness. Negative for dizziness and headaches.      Objective    BP (!) 140/68 (BP Location: Right Arm, Patient Position: Sitting, Cuff Size: Small)   Pulse 73   Temp 97.6 F (36.4 C) (Oral)   Resp 18   Wt 131 lb (59.4 kg)   SpO2 99%   BMI 21.14 kg/m   Physical Exam Vitals reviewed.  Constitutional:      Appearance: She is not ill-appearing.  HENT:     Head: Normocephalic.     Ears:     Comments: L TM with no visible tube but visible perf in TM where tube was. No bleeding or discharge Eyes:     Conjunctiva/sclera: Conjunctivae normal.  Neck:     Comments: L post cervical/auricular lymph node that is 1-2 cm firm,  fixed.   Cardiovascular:     Rate and Rhythm: Normal rate.  Pulmonary:     Effort: Pulmonary effort is normal. No respiratory distress.  Neurological:     General: No focal deficit present.     Mental Status: She is alert and oriented to person, place, and time.  Psychiatric:        Mood and Affect: Mood normal.        Behavior: Behavior normal.      No results found for any visits on 10/23/22.  Assessment & Plan     1. Cervical lymphadenopathy Ordered US of lymph no r/o malignancy.  If normal, would refer to ENT vs neuro   - US Soft Tissue Head/Neck (NON-THYROID); Future   Return if symptoms worsen or fail to improve.      I, Alfredia Ferguson, PA-C have reviewed all documentation for this  visit. The documentation on  10/23/22   for the exam, diagnosis, procedures, and orders are all accurate and complete.    Alfredia Ferguson, PA-C  [facility] [phone] (phone) [phone] (fax)  [facility]

## 2022-10-22 NOTE — Patient Instructions (Signed)
Try 1 mg Melatonin at night for sleep  Avoid fluid intake 1-2 prior to bedtime  Follow-up in 3 weeks for consideration of repeat OMT / knee pain

## 2022-10-23 ENCOUNTER — Ambulatory Visit (HOSPITAL_BASED_OUTPATIENT_CLINIC_OR_DEPARTMENT_OTHER)
Admission: RE | Admit: 2022-10-23 | Discharge: 2022-10-23 | Disposition: A | Discharge status: Home / Self Care | Payer: Medicare HMO | Source: Ambulatory Visit | Attending: Physician Assistant | Admitting: Physician Assistant

## 2022-10-23 ENCOUNTER — Encounter: Payer: Self-pay | Admitting: Physician Assistant

## 2022-10-23 ENCOUNTER — Ambulatory Visit (INDEPENDENT_AMBULATORY_CARE_PROVIDER_SITE_OTHER): Payer: Medicare HMO | Admitting: Physician Assistant

## 2022-10-23 VITALS — BP 140/68 | HR 73 | Temp 97.6°F | Resp 18 | Wt 131.0 lb

## 2022-10-23 DIAGNOSIS — R590 Localized enlarged lymph nodes: Secondary | ICD-10-CM | POA: Diagnosis not present

## 2022-10-23 DIAGNOSIS — H9392 Unspecified disorder of left ear: Secondary | ICD-10-CM | POA: Diagnosis not present

## 2022-10-26 ENCOUNTER — Other Ambulatory Visit: Payer: Self-pay | Admitting: Physician Assistant

## 2022-10-26 DIAGNOSIS — R220 Localized swelling, mass and lump, head: Secondary | ICD-10-CM

## 2022-10-27 ENCOUNTER — Encounter: Payer: Self-pay | Admitting: Physical Therapy

## 2022-10-27 ENCOUNTER — Ambulatory Visit: Payer: Medicare HMO | Admitting: Physical Therapy

## 2022-10-27 DIAGNOSIS — M4124 Other idiopathic scoliosis, thoracic region: Secondary | ICD-10-CM | POA: Diagnosis not present

## 2022-10-27 DIAGNOSIS — M5459 Other low back pain: Secondary | ICD-10-CM | POA: Diagnosis not present

## 2022-10-27 DIAGNOSIS — G2589 Other specified extrapyramidal and movement disorders: Secondary | ICD-10-CM | POA: Diagnosis not present

## 2022-10-27 DIAGNOSIS — R293 Abnormal posture: Secondary | ICD-10-CM | POA: Diagnosis not present

## 2022-10-27 DIAGNOSIS — M9908 Segmental and somatic dysfunction of rib cage: Secondary | ICD-10-CM | POA: Diagnosis not present

## 2022-10-27 DIAGNOSIS — M546 Pain in thoracic spine: Secondary | ICD-10-CM | POA: Diagnosis not present

## 2022-10-27 DIAGNOSIS — M62830 Muscle spasm of back: Secondary | ICD-10-CM

## 2022-10-27 DIAGNOSIS — M9902 Segmental and somatic dysfunction of thoracic region: Secondary | ICD-10-CM | POA: Diagnosis not present

## 2022-10-27 DIAGNOSIS — M7552 Bursitis of left shoulder: Secondary | ICD-10-CM | POA: Diagnosis not present

## 2022-10-27 DIAGNOSIS — M6281 Muscle weakness (generalized): Secondary | ICD-10-CM

## 2022-10-27 NOTE — Therapy (Signed)
OUTPATIENT PHYSICAL THERAPY TREATMENT   Patient Name: Jillian Hayes MRN: [id number] DOB:08-31-1945, 77 y.o., female Today's Date: 10/27/2022  END OF SESSION:  PT End of Session - 10/27/22 1536     Visit Number 3    Date for PT Re-Evaluation 12/03/22    Progress Note Due on Visit 10    PT Start Time 1535    PT Stop Time 1614    PT Time Calculation (min) 39 min    Activity Tolerance Patient limited by pain;Patient tolerated treatment well    Behavior During Therapy [facility] for tasks assessed/performed             Past Medical History:  Diagnosis Date   Advanced care planning/counseling discussion 06/10/2014   05/31/2014 patient presents copy of HCP and Living Will   Anemia    iron deficiency   Anxiety    Arthritis    "in hands"   Atrial fibrillation (HCC)    Benign fundic gland polyps of stomach    Bladder polyps 06/25/2010   Chronic headaches 06/24/2010   Complication of anesthesia    "woke up at the end of a cyst removal surgery in 1991"   Depression 1991   hospitalized   Diverticulosis    DVT of right axillary vein, acute (HCC) 08/07/2022   ESRD (end stage renal disease) (HCC) 11/09/2015   HD on MWF   External prolapsed hemorrhoids    History of chicken pox 06/25/2010   Hypercalcemia 02/18/2014   Hypertension    Insomnia 06/24/2010   Multiple chemical sensitivity syndrome 06/25/2010   Proteinuria 02/18/2014   Renal insufficiency 03/26/2011   Valvular heart disease 04/28/2016   Past Surgical History:  Procedure Laterality Date   AV FISTULA PLACEMENT Left    x4   COLONOSCOPY  2014   cyst on left breast removed Left 1991   benign   ESOPHAGOGASTRODUODENOSCOPY (EGD) WITH ESOPHAGEAL DILATION  2014   LAPAROSCOPIC BILATERAL SALPINGO OOPHERECTOMY Bilateral 08/20/2020   Procedure: LAPAROSCOPIC BILATERAL SALPINGO OOPHORECTOMY;  Surgeon: Jerene Bears, MD;  Location: [facility] OR;  Service: Gynecology;  Laterality: Bilateral;   NASAL SEPTUM SURGERY  1986   rhinoplasty    polyps on bladder removed  1972   benign   RIGHT HEART CATH N/A 04/10/2021   Procedure: RIGHT HEART CATH;  Surgeon: Laurey Morale, MD;  Location: [facility] INVASIVE CV LAB;  Service: Cardiovascular;  Laterality: N/A;   TONSILLECTOMY  1962   Patient Active Problem List   Diagnosis Date Noted   DVT of right axillary vein, acute (HCC) 08/07/2022   Swelling of upper arm 08/03/2022   Abnormal INR 08/03/2022   SOB (shortness of breath) 07/28/2022   Urinary hesitancy 07/28/2022   Osteoporosis 07/28/2022   Right shoulder pain 07/27/2022   Dialysis AV fistula malfunction (HCC) 06/30/2022   Upper back pain 05/05/2022   Neck pain 05/05/2022   Hyperglycemia 05/05/2022   Disorder of vitamin B12 05/05/2022   Pulmonary hypertension, primary (HCC) 03/09/2021   Low TSH level 03/09/2021   Right knee pain 03/06/2021   Primary osteoarthritis of right knee 02/25/2021   Bilateral carotid artery stenosis 11/21/2020   Adnexal mass    Left foot drop 02/08/2020   A-V fistula (HCC) 08/03/2019   Nonrheumatic mitral valve regurgitation 06/12/2019   Edema 04/09/2019   Drug-induced constipation 10/18/2018   External prolapsed hemorrhoids 09/25/2018   Malignant hypertension 04/20/2018   Hyperphosphatemia 03/09/2018   Leg swelling 03/03/2018   Long term (current) use of anticoagulants 11/01/2017  Atrial fibrillation (HCC) 10/25/2017   Hyperparathyroidism, unspecified (HCC) 05/04/2017   DDD (degenerative disc disease), lumbar 04/07/2017   Hearing loss 01/04/2017   Hyperlipidemia 01/04/2017   Arthritis of fingers of both hands 10/01/2016   Chronic rhinitis 10/01/2016   Nonrheumatic aortic valve insufficiency 09/29/2016   Bruit 09/29/2016   Palpitation 09/22/2016   Aortic valve disease 04/28/2016   Anemia in ESRD (end-stage renal disease) (HCC) 03/03/2016   Sun-damaged skin 01/21/2016   ESRD (end stage renal disease) (HCC) 11/09/2015   Chronic kidney disease, stage 5 (HCC) 07/03/2015   Advanced care  planning/counseling discussion 06/10/2014   Pedal edema 03/28/2014   Proteinuria 02/18/2014   Hypercalcemia 02/18/2014   Abnormal EKG 01/22/2014   HTN (hypertension) 01/22/2014   Dermatitis 06/30/2013   Abnormal thyroid function test 06/25/2013   Cervical cancer screening 06/10/2012   Preventative health care 11/09/2011   Multiple chemical sensitivity syndrome 06/25/2010   History of chicken pox 06/25/2010   Bladder polyps 06/25/2010   History of cardiac dysrhythmia 06/25/2010   Insomnia 06/24/2010   Fatigue 06/24/2010   Chronic headaches 06/24/2010   Allergy    Anemia    Depression     PCP: Reuel Derby, MD  REFERRING PROVIDER: Richardean Sale, DO  REFERRING DIAG: scapulothoracic bursitis L  Rationale for Evaluation and Treatment: Rehabilitation  THERAPY DIAG:  Pain in thoracic spine  Abnormal posture  Muscle spasm of back  Muscle weakness (generalized)  Other low back pain  ONSET DATE: chronic  SUBJECTIVE:                                                                                                                                                                                           SUBJECTIVE STATEMENT: Feeling a little better, thinks the tape is helping.   Not as much pain during dialysis yesterday.  Tips really helped husband with taping.     PERTINENT HISTORY:  Chronic L periscapular pain x 1 year after driving to visit family. Has tried chiropractic, injections, no relief.  Wants to try dry needling again.  Unable to tolerate sleeping or sitting in dialysis chair  PAIN:  Are you having pain? Yes: NPRS scale: 3/10 Pain location: L medial scapular border, now radiating into L lateral lower ribs Pain description: constant worse sitting soft chair and sleep Aggravating factors: see above Relieving factors: ice  PRECAUTIONS: Other: osteoporosis  RED FLAGS: None   WEIGHT BEARING RESTRICTIONS: No  FALLS:  Has patient fallen in last 6 months?  No  LIVING ENVIRONMENT: Lives with: lives with their family Lives in: House/apartment Stairs: No Has following equipment at home: None  OCCUPATION:  retired  PLOF: Independent  PATIENT GOALS: sleep better   NEXT MD VISIT: 2 weeks  OBJECTIVE:   DIAGNOSTIC FINDINGS:  na  PATIENT SURVEYS:  Modified Oswestry 25/50   SCREENING FOR RED FLAGS:scapulothoracic bursitis L Bowel or bladder incontinence: No Spinal tumors: No Cauda equina syndrome: No Compression fracture: No Abdominal aneurysm: No  COGNITION: Overall cognitive status: Within functional limits for tasks assessed     SENSATION: WFL  POSTURE:  scoliosis, L shoulder elevated, slight rotation to R thoracic spine  PALPATION: "Sore" tender L lateral thoracic paraspinals, periscapular, supraspinatus PA segmental testing thoracic spine decreased mobility T 3, T 7. Lateral and post ribs with normal mobility, tender on L post lower ribs  LUMBAR ROM: NT Thoracic ROM wfl  all directions  Cervical spine wfl all directions Decreased movement noted with isolation of L 2st rib, seated cervical flex/SB   B shoulders wfl but R sholder with poor mechanics for final 15 degrees of movement MMT: R shoulder flex, abd 4/5 Otherwise symmetrical , wfl   GAIT: Distance walked: 37' in clnic, wnl  TODAY'S TREATMENT:                                                                                                                              DATE:   10/27/22 Manual Therapy: to decrease muscle spasm and pain and improve mobility STM/TPR to cervical paraspinals, L UT, L L/S, L thoracic erector spinae, L latissimus.  Mobs to thoracic spine (PA and UPA grade 1-2),     Kinesiotaping 2 pieces in x pattern extending from post shoulder to just superior to opposite iliac crest.  1  piece along L thoracic erector spinae, and another along L UT and L/S (split Y).   25% stretch, 0 stretch on ends.    Skilled palpation and monitoring during  dry needling. Trigger Point Dry-Needling  Treatment instructions: Expect mild to moderate muscle soreness. S/S of pneumothorax if dry needled over a lung field, and to seek immediate medical attention should they occur. Patient verbalized understanding of these instructions and education. Patient Consent Given: Yes Education handout provided: Previously provided Muscles treated: L UT, LS, L thoracic multifidi T8-10, L erector spinae Electrical stimulation performed: No Parameters: N/A Treatment response/outcome: Twitch Response Elicited and Palpable Increase in Muscle Length  10/20/22: Manual Therapy: to decrease muscle spasm and pain and improve mobility STM/TPR to cervical paraspinals, bil UT, L L/S rhomboids, subscap, supraspinatus, infraspinatus, latissimus.   Kinesiotaping 2 pieces in x pattern extending from post shoulder to just superior to opposite iliac crest.  Skilled palpation and monitoring during dry needling. Trigger Point Dry-Needling  Treatment instructions: Expect mild to moderate muscle soreness. S/S of pneumothorax if dry needled over a lung field, and to seek immediate medical attention should they occur. Patient verbalized understanding of these instructions and education. Patient Consent Given: Yes Education handout provided: Previously provided Muscles treated: L UT, LS, supraspinatus, infraspinatus, Latissimus Electrical stimulation performed: No  Parameters: N/A Treatment response/outcome: Twitch Response Elicited and Palpable Increase in Muscle Length Self Care: Education on how to tape - anchor points, stretch, activation of tape using body heat, review of post -TrDN - gentle movements, extra water as permitted, tylenol as permitted, review of previous HEP.   10/08/22: HVLA for cervicothoracic jxn B and in supine for  T 2 Side lying for rib recoil muscle energy each side  Kinesiotaping 2 pieces in x pattern extending from post shoulder to just superior to opposite  iliac crest  PATIENT EDUCATION:  Education details: see self care Person educated: Patient Education method: Explanation and Demonstration Education comprehension: verbalized understanding and returned demonstration  HOME EXERCISE PROGRAM: na  ASSESSMENT:  CLINICAL IMPRESSION: Jillian Hayes reports taping and TrDN helping to decrease pain and improving ability to tolerate sitting prolonged periods for dialysis.  Today continued with emphasis on manual therapy, noting more pain along thoracic region today.  Added additional taping in different patterns to trial to see if these helped pain as well.   Jillian Hayes continues to demonstrate potential for improvement and would benefit from continued skilled therapy to address impairments.     OBJECTIVE IMPAIRMENTS: decreased activity tolerance, decreased strength, increased fascial restrictions, impaired perceived functional ability, impaired UE functional use, and pain.   ACTIVITY LIMITATIONS: carrying, lifting, standing, sleeping, and reach over head  PARTICIPATION LIMITATIONS: meal prep  PERSONAL FACTORS: Age, Behavior pattern, Past/current experiences, Time since onset of injury/illness/exacerbation, and 3+ comorbidities: ESRD, osteoporosis,   are also affecting patient's functional outcome.   REHAB POTENTIAL: Fair    CLINICAL DECISION MAKING: Evolving/moderate complexity  EVALUATION COMPLEXITY: Moderate   GOALS: Goals reviewed with patient? Yes  SHORT TERM GOALS: Target date: I HEP  TBD Baseline: Goal status: INITIAL   LONG TERM GOALS: Target date: 12/03/22  Modified Oswestry improve to 15/50 Baseline: 25/50 Goal status: INITIAL  2.  Strength B shoulder horizontal abd(middle traps, and lower traps 5/5) Baseline: TBD Goal status: INITIAL  3.  Sleeping improve to greater than 2 hrs without pain L scapulothoracic region Baseline:  Goal status: INITIAL  PLAN:  PT FREQUENCY: 1x/week  PT DURATION: 8  weeks  PLANNED INTERVENTIONS: Therapeutic exercises, Therapeutic activity, Neuromuscular re-education, Balance training, Gait training, Patient/Family education, Self Care, and Joint mobilization.  PLAN FOR NEXT SESSION: initiate isometrics for cervical, thoracic spine extensors and for scapular retractors, dry needling, reassess tape and reapply, test strength middle and lower traps   Jena Gauss, PT, DPT  10/27/2022, 5:28 PM

## 2022-11-02 DIAGNOSIS — Z992 Dependence on renal dialysis: Secondary | ICD-10-CM | POA: Diagnosis not present

## 2022-11-02 DIAGNOSIS — N186 End stage renal disease: Secondary | ICD-10-CM | POA: Diagnosis not present

## 2022-11-03 ENCOUNTER — Ambulatory Visit: Payer: Medicare HMO | Attending: Sports Medicine | Admitting: Physical Therapy

## 2022-11-03 DIAGNOSIS — R293 Abnormal posture: Secondary | ICD-10-CM | POA: Diagnosis not present

## 2022-11-03 DIAGNOSIS — M6281 Muscle weakness (generalized): Secondary | ICD-10-CM | POA: Diagnosis not present

## 2022-11-03 DIAGNOSIS — M546 Pain in thoracic spine: Secondary | ICD-10-CM | POA: Diagnosis not present

## 2022-11-03 DIAGNOSIS — M5459 Other low back pain: Secondary | ICD-10-CM | POA: Diagnosis not present

## 2022-11-03 DIAGNOSIS — M62830 Muscle spasm of back: Secondary | ICD-10-CM | POA: Diagnosis not present

## 2022-11-03 NOTE — Therapy (Signed)
OUTPATIENT PHYSICAL THERAPY TREATMENT   Patient Name: Jillian Hayes MRN: [id number] DOB:[birth year], 77 y.o., female Today's Date: 11/03/2022  END OF SESSION:  PT End of Session - 11/03/22 1539     Visit Number 4    Date for PT Re-Evaluation 12/03/22    Progress Note Due on Visit 10    PT Start Time 1535    PT Stop Time 1625    PT Time Calculation (min) 50 min    Activity Tolerance Patient limited by pain;Patient tolerated treatment well    Behavior During Therapy [facility] for tasks assessed/performed             Past Medical History:  Diagnosis Date   Advanced care planning/counseling discussion 06/10/2014   05/31/2014 patient presents copy of HCP and Living Will   Anemia    iron deficiency   Anxiety    Arthritis    "in hands"   Atrial fibrillation (HCC)    Benign fundic gland polyps of stomach    Bladder polyps 06/25/2010   Chronic headaches 06/24/2010   Complication of anesthesia    "woke up at the end of a cyst removal surgery in 1991"   Depression 1991   hospitalized   Diverticulosis    DVT of right axillary vein, acute (HCC) 08/07/2022   ESRD (end stage renal disease) (HCC) 11/09/2015   HD on MWF   External prolapsed hemorrhoids    History of chicken pox 06/25/2010   Hypercalcemia 02/18/2014   Hypertension    Insomnia 06/24/2010   Multiple chemical sensitivity syndrome 06/25/2010   Proteinuria 02/18/2014   Renal insufficiency 03/26/2011   Valvular heart disease 04/28/2016   Past Surgical History:  Procedure Laterality Date   AV FISTULA PLACEMENT Left    x4   COLONOSCOPY  2014   cyst on left breast removed Left 1991   benign   ESOPHAGOGASTRODUODENOSCOPY (EGD) WITH ESOPHAGEAL DILATION  2014   LAPAROSCOPIC BILATERAL SALPINGO OOPHERECTOMY Bilateral 08/20/2020   Procedure: LAPAROSCOPIC BILATERAL SALPINGO OOPHORECTOMY;  Surgeon: Jerene Bears, MD;  Location: [facility] OR;  Service: Gynecology;  Laterality: Bilateral;   NASAL SEPTUM SURGERY  1986   rhinoplasty    polyps on bladder removed  1972   benign   RIGHT HEART CATH N/A 04/10/2021   Procedure: RIGHT HEART CATH;  Surgeon: Laurey Morale, MD;  Location: [facility] INVASIVE CV LAB;  Service: Cardiovascular;  Laterality: N/A;   TONSILLECTOMY  1962   Patient Active Problem List   Diagnosis Date Noted   DVT of right axillary vein, acute (HCC) 08/07/2022   Swelling of upper arm 08/03/2022   Abnormal INR 08/03/2022   SOB (shortness of breath) 07/28/2022   Urinary hesitancy 07/28/2022   Osteoporosis 07/28/2022   Right shoulder pain 07/27/2022   Dialysis AV fistula malfunction (HCC) 06/30/2022   Upper back pain 05/05/2022   Neck pain 05/05/2022   Hyperglycemia 05/05/2022   Disorder of vitamin B12 05/05/2022   Pulmonary hypertension, primary (HCC) 03/09/2021   Low TSH level 03/09/2021   Right knee pain 03/06/2021   Primary osteoarthritis of right knee 02/25/2021   Bilateral carotid artery stenosis 11/21/2020   Adnexal mass    Left foot drop 02/08/2020   A-V fistula (HCC) 08/03/2019   Nonrheumatic mitral valve regurgitation 06/12/2019   Edema 04/09/2019   Drug-induced constipation 10/18/2018   External prolapsed hemorrhoids 09/25/2018   Malignant hypertension 04/20/2018   Hyperphosphatemia 03/09/2018   Leg swelling 03/03/2018   Long term (current) use of anticoagulants 11/01/2017  Atrial fibrillation (HCC) 10/25/2017   Hyperparathyroidism, unspecified (HCC) 05/04/2017   DDD (degenerative disc disease), lumbar 04/07/2017   Hearing loss 01/04/2017   Hyperlipidemia 01/04/2017   Arthritis of fingers of both hands 10/01/2016   Chronic rhinitis 10/01/2016   Nonrheumatic aortic valve insufficiency 09/29/2016   Bruit 09/29/2016   Palpitation 09/22/2016   Aortic valve disease 04/28/2016   Anemia in ESRD (end-stage renal disease) (HCC) 03/03/2016   Sun-damaged skin 01/21/2016   ESRD (end stage renal disease) (HCC) 11/09/2015   Chronic kidney disease, stage 5 (HCC) 07/03/2015   Advanced care  planning/counseling discussion 06/10/2014   Pedal edema 03/28/2014   Proteinuria 02/18/2014   Hypercalcemia 02/18/2014   Abnormal EKG 01/22/2014   HTN (hypertension) 01/22/2014   Dermatitis 06/30/2013   Abnormal thyroid function test 06/25/2013   Cervical cancer screening 06/10/2012   Preventative health care 11/09/2011   Multiple chemical sensitivity syndrome 06/25/2010   History of chicken pox 06/25/2010   Bladder polyps 06/25/2010   History of cardiac dysrhythmia 06/25/2010   Insomnia 06/24/2010   Fatigue 06/24/2010   Chronic headaches 06/24/2010   Allergy    Anemia    Depression     PCP: Reuel Derby, MD  REFERRING PROVIDER: Richardean Sale, DO  REFERRING DIAG: scapulothoracic bursitis L  Rationale for Evaluation and Treatment: Rehabilitation  THERAPY DIAG:  Pain in thoracic spine  Abnormal posture  Muscle spasm of back  Muscle weakness (generalized)  ONSET DATE: chronic  SUBJECTIVE:                                                                                                                                                                                           SUBJECTIVE STATEMENT: Feels like the pain is getting better, sleeping better, would like to work on exercises today instead of DN and taping.   Taping but not every day now, just on dialysis days.   PERTINENT HISTORY:  Chronic L periscapular pain x 1 year after driving to visit family. Has tried chiropractic, injections, no relief.  Wants to try dry needling again.  Unable to tolerate sleeping or sitting in dialysis chair  PAIN:  Are you having pain? Yes: NPRS scale: 2/10 Pain location: L medial scapular border, now radiating into L lateral lower ribs Pain description: constant worse sitting soft chair and sleep Aggravating factors: see above Relieving factors: ice  PRECAUTIONS: Other: osteoporosis  RED FLAGS: None   WEIGHT BEARING RESTRICTIONS: No  FALLS:  Has patient fallen in last 6  months? No  LIVING ENVIRONMENT: Lives with: lives with their family Lives in: House/apartment Stairs: No Has following equipment at home: None  OCCUPATION: retired  PLOF: Independent  PATIENT GOALS: sleep better   NEXT MD VISIT: 2 weeks  OBJECTIVE:   DIAGNOSTIC FINDINGS:  na  PATIENT SURVEYS:  Modified Oswestry 25/50   SCREENING FOR RED FLAGS:scapulothoracic bursitis L Bowel or bladder incontinence: No Spinal tumors: No Cauda equina syndrome: No Compression fracture: No Abdominal aneurysm: No  COGNITION: Overall cognitive status: Within functional limits for tasks assessed     SENSATION: WFL  POSTURE:  scoliosis, L shoulder elevated, slight rotation to R thoracic spine  PALPATION: "Sore" tender L lateral thoracic paraspinals, periscapular, supraspinatus PA segmental testing thoracic spine decreased mobility T 3, T 7. Lateral and post ribs with normal mobility, tender on L post lower ribs  LUMBAR ROM: NT Thoracic ROM wfl  all directions  Cervical spine wfl all directions Decreased movement noted with isolation of L 2st rib, seated cervical flex/SB   B shoulders wfl but R sholder with poor mechanics for final 15 degrees of movement MMT: R shoulder flex, abd 4/5 Otherwise symmetrical , wfl   GAIT: Distance walked: 34' in clnic, wnl  TODAY'S TREATMENT:                                                                                                                              DATE:   11/03/22 Therapeutic Exercise: to improve strength and mobility.  Demo, verbal and tactile cues throughout for technique. Nustep L4 x 5 min  QL stretch sitting UT twists Scap squeezes Shoulder rolls Seated horizontal abduction x 10  At wall - open books - L side, increased pain on R side Wall slides into flexion Shoulder press into ball - r side to center humeral head Manual Therapy: to decrease muscle spasm and pain and improve mobility Gentle PROM R shoulder all  directions, STM/TPR to R pectoralis and R L/S  10/27/22 Manual Therapy: to decrease muscle spasm and pain and improve mobility STM/TPR to cervical paraspinals, L UT, L L/S, L thoracic erector spinae, L latissimus.  Mobs to thoracic spine (PA and UPA grade 1-2),     Kinesiotaping 2 pieces in x pattern extending from post shoulder to just superior to opposite iliac crest.  1  piece along L thoracic erector spinae, and another along L UT and L/S (split Y).   25% stretch, 0 stretch on ends.    Skilled palpation and monitoring during dry needling. Trigger Point Dry-Needling  Treatment instructions: Expect mild to moderate muscle soreness. S/S of pneumothorax if dry needled over a lung field, and to seek immediate medical attention should they occur. Patient verbalized understanding of these instructions and education. Patient Consent Given: Yes Education handout provided: Previously provided Muscles treated: L UT, LS, L thoracic multifidi T8-10, L erector spinae Electrical stimulation performed: No Parameters: N/A Treatment response/outcome: Twitch Response Elicited and Palpable Increase in Muscle Length  10/20/22: Manual Therapy: to decrease muscle spasm and pain and improve mobility STM/TPR to cervical paraspinals, bil UT, L L/S  rhomboids, subscap, supraspinatus, infraspinatus, latissimus.   Kinesiotaping 2 pieces in x pattern extending from post shoulder to just superior to opposite iliac crest.  Skilled palpation and monitoring during dry needling. Trigger Point Dry-Needling  Treatment instructions: Expect mild to moderate muscle soreness. S/S of pneumothorax if dry needled over a lung field, and to seek immediate medical attention should they occur. Patient verbalized understanding of these instructions and education. Patient Consent Given: Yes Education handout provided: Previously provided Muscles treated: L UT, LS, supraspinatus, infraspinatus, Latissimus Electrical stimulation performed:  No Parameters: N/A Treatment response/outcome: Twitch Response Elicited and Palpable Increase in Muscle Length Self Care: Education on how to tape - anchor points, stretch, activation of tape using body heat, review of post -TrDN - gentle movements, extra water as permitted, tylenol as permitted, review of previous HEP.    PATIENT EDUCATION:  Education details: HEP update Person educated: Patient Education method: Explanation, Demonstration, Verbal cues, and Handouts Education comprehension: verbalized understanding and returned demonstration  HOME EXERCISE PROGRAM: Access Code: [email or web address] URL: [email or web address] Date: 11/03/2022 Prepared by: Harrie Foreman  Exercises - Seated Quadratus Lumborum Stretch in Chair  - 1 x daily - 7 x weekly - 1 sets - 3 reps - Seated Trunk Rotation Stretch  - 1 x daily - 7 x weekly - 1 sets - 10 reps - Seated Shoulder Horizontal Abduction - Thumbs Up  - 1 x daily - 7 x weekly - 1 sets - 10 reps - Standing Thoracic Open Book at Wall  - 1 x daily - 7 x weekly - 1 sets - 10 reps - Standing shoulder flexion wall slides  - 1 x daily - 7 x weekly - 1 sets - 10 reps - Seated Shoulder Rolls  - 1 x daily - 7 x weekly - 1 sets - 10 reps  ASSESSMENT:  CLINICAL IMPRESSION: Jillian Hayes that her pain is improving, sleeping better and having more energy.  Taping at home now by family on dialysis days which is helping.  Today focused on exercises, limited today by R shoulder pain with abduction, but otherwise tolerated well.   Manual therapy to R shoulder to improve ROM, also noted trigger point still in R Levator.  Updated HEP.   Jillian Hayes continues to demonstrate potential for improvement and would benefit from continued skilled therapy to address impairments.     OBJECTIVE IMPAIRMENTS: decreased activity tolerance, decreased strength, increased fascial restrictions, impaired perceived functional ability, impaired UE functional use, and  pain.   ACTIVITY LIMITATIONS: carrying, lifting, standing, sleeping, and reach over head  PARTICIPATION LIMITATIONS: meal prep  PERSONAL FACTORS: Age, Behavior pattern, Past/current experiences, Time since onset of injury/illness/exacerbation, and 3+ comorbidities: ESRD, osteoporosis,   are also affecting patient's functional outcome.   REHAB POTENTIAL: Fair    CLINICAL DECISION MAKING: Evolving/moderate complexity  EVALUATION COMPLEXITY: Moderate   GOALS: Goals reviewed with patient? Yes  SHORT TERM GOALS: Target date: 11/10/22  I HEP Baseline: Goal status: IN PROGRESS 11/03/22- given   LONG TERM GOALS: Target date: 12/03/22  Modified Oswestry improve to 15/50 Baseline: 25/50 Goal status: IN PROGRESS  2.  Strength B shoulder horizontal abd(middle traps, and lower traps 5/5) Baseline: TBD Goal status: IN PROGRESS  3.  Sleeping improve to greater than 2 hrs without pain L scapulothoracic region Baseline:  Goal status: IN PROGRESS  PLAN:  PT FREQUENCY: 1x/week  PT DURATION: 8 weeks  PLANNED INTERVENTIONS: Therapeutic exercises, Therapeutic activity, Neuromuscular re-education, Balance training, Gait training, Patient/Family education,  Self Care, and Joint mobilization.  PLAN FOR NEXT SESSION: initiate isometrics for cervical, thoracic spine extensors and for scapular retractors, manual therapy, TrDN as needed   Jena Gauss, PT, DPT  11/03/2022, 4:36 PM

## 2022-11-10 ENCOUNTER — Encounter: Payer: Medicare HMO | Admitting: Physical Therapy

## 2022-11-10 ENCOUNTER — Telehealth: Payer: Self-pay | Admitting: Family Medicine

## 2022-11-10 ENCOUNTER — Other Ambulatory Visit: Payer: Self-pay | Admitting: Family Medicine

## 2022-11-10 DIAGNOSIS — R590 Localized enlarged lymph nodes: Secondary | ICD-10-CM

## 2022-11-10 DIAGNOSIS — R221 Localized swelling, mass and lump, neck: Secondary | ICD-10-CM

## 2022-11-10 NOTE — Telephone Encounter (Signed)
Pt said that MRI was denied by insurance. Pt wanted to know what she should do next. Please call to advise if there will be a referral to ENT or Neurologist

## 2022-11-10 NOTE — Telephone Encounter (Signed)
Called pt lvm regarding CT and ENT referral, sent mychart message.

## 2022-11-11 NOTE — Progress Notes (Unsigned)
Aleen Sells D[facility] [address] [place] [address] Phone: [phone]   Assessment and Plan:     There are no diagnoses linked to this encounter.  ***   Pertinent previous records reviewed include ***   Follow Up: ***     Subjective:   I, Stevenson Clinch, CMA, am serving as a scribe for Doctor Richardean Sale  Chief Complaint: left shoulder pain    HPI:    09/24/2022 Patient is a 77 year old female complaining of left shoulder pain. Patient states she sees Dr. Shon Baton and she has tried different types of injections to help with her shoulder blade on right side. The injection they either didn't help or didn't last at all. Patient was referred here to see what the next steps are. Patient states the pain started a few months ago when driving to [place] and the pain was only when she would be doing daily activities but would go away with rest. The pain now is constant.    10/22/2022  Patient states continued left shoulder pain. Has been to PT and had kinesiology taping, that was somewhat helpful. Has also tried dry-needling, got short-term relief. Denies new or worsening sx since last visit, continued aching. OMT was helpful short-term for sx.    11/12/2022 Patient states  Relevant Historical Information: ESRD on dialysis, atrial fibrillation, history of DVT, on chronic anticoagulation with Eliquis  Additional pertinent review of systems negative.   Current Outpatient Medications:    acetaminophen (TYLENOL) 500 MG tablet, Take 500 mg by mouth every 6 (six) hours as needed for headache (pain)., Disp: , Rfl:    Alfalfa 500 MG TABS, Take 500 mg by mouth 3 (three) times daily., Disp: , Rfl:    apixaban (ELIQUIS) 5 MG TABS tablet, Take 1 tablet (5 mg total) by mouth 2 (two) times daily., Disp: 60 tablet, Rfl: 6   atorvastatin (LIPITOR) 20 MG tablet, Take 20 mg by mouth at bedtime., Disp: , Rfl:    carvedilol (COREG) 25 MG tablet, Take  1 tablet (25 mg total) by mouth 2 (two) times daily., Disp: 180 tablet, Rfl: 3   cinacalcet (SENSIPAR) 30 MG tablet, Take 30 mg by mouth at bedtime., Disp: , Rfl:    diltiazem (CARDIZEM CD) 240 MG 24 hr capsule, Take 240 mg by mouth daily with lunch., Disp: , Rfl:    hydrALAZINE (APRESOLINE) 50 MG tablet, Take 75 mg by mouth 2 (two) times daily., Disp: , Rfl:    L-Glutamine 500 MG CAPS, Take 500 mg by mouth every evening., Disp: , Rfl:    lidocaine 4 %, Place 1 patch onto the skin daily., Disp: 10 patch, Rfl: 1   lidocaine-prilocaine (EMLA) cream, Apply 1 application  topically every Monday, Wednesday, and Friday with hemodialysis., Disp: , Rfl:    Misc Natural Products (SAMBUCUS ELDERBERRY IMMUNE) CHEW, Chew 1 capsule by mouth daily., Disp: , Rfl:    OVER THE COUNTER MEDICATION, Take 3 capsules by mouth every evening. Shaklee Herb-Lax Natural Laxative for Colon Cleanse (Senna, Licorice, and Alfalfa), Disp: , Rfl:    sevelamer carbonate (RENVELA) 800 MG tablet, Take 2,400 mg by mouth See admin instructions. Take 3 tablets (2400 mg) by mouth with each meal & take 2 tablets (1600 mg) by mouth with each large snack, Disp: , Rfl:    tiZANidine (ZANAFLEX) 2 MG tablet, Take 0.5-1 tablets (1-2 mg total) by mouth every 6 (six) hours as needed for muscle spasms. (Patient  not taking: Reported on 10/23/2022), Disp: 20 tablet, Rfl: 0   vitamin C (ASCORBIC ACID) 500 MG tablet, Take 500 mg by mouth daily., Disp: , Rfl:    Objective:     There were no vitals filed for this visit.    There is no height or weight on file to calculate BMI.    Physical Exam:    ***   Electronically signed by:  Aleen Sells [facility] 3:30 PM 11/11/22

## 2022-11-12 ENCOUNTER — Ambulatory Visit: Payer: Medicare HMO | Admitting: Sports Medicine

## 2022-11-12 VITALS — BP 120/62 | HR 75 | Ht 66.0 in | Wt 135.0 lb

## 2022-11-12 DIAGNOSIS — M9907 Segmental and somatic dysfunction of upper extremity: Secondary | ICD-10-CM | POA: Diagnosis not present

## 2022-11-12 DIAGNOSIS — M9908 Segmental and somatic dysfunction of rib cage: Secondary | ICD-10-CM | POA: Diagnosis not present

## 2022-11-12 DIAGNOSIS — M9902 Segmental and somatic dysfunction of thoracic region: Secondary | ICD-10-CM | POA: Diagnosis not present

## 2022-11-12 DIAGNOSIS — M4124 Other idiopathic scoliosis, thoracic region: Secondary | ICD-10-CM

## 2022-11-12 DIAGNOSIS — M7552 Bursitis of left shoulder: Secondary | ICD-10-CM | POA: Diagnosis not present

## 2022-11-12 DIAGNOSIS — G2589 Other specified extrapyramidal and movement disorders: Secondary | ICD-10-CM

## 2022-11-12 NOTE — Patient Instructions (Signed)
4 week MSK

## 2022-11-17 ENCOUNTER — Ambulatory Visit: Payer: Medicare HMO | Admitting: Physical Therapy

## 2022-11-17 ENCOUNTER — Encounter: Payer: Self-pay | Admitting: Physical Therapy

## 2022-11-17 ENCOUNTER — Ambulatory Visit (HOSPITAL_BASED_OUTPATIENT_CLINIC_OR_DEPARTMENT_OTHER)
Admission: RE | Admit: 2022-11-17 | Discharge: 2022-11-17 | Disposition: A | Discharge status: Home / Self Care | Payer: Medicare HMO | Source: Ambulatory Visit | Attending: Family Medicine | Admitting: Family Medicine

## 2022-11-17 ENCOUNTER — Encounter (HOSPITAL_BASED_OUTPATIENT_CLINIC_OR_DEPARTMENT_OTHER): Payer: Self-pay

## 2022-11-17 DIAGNOSIS — M62830 Muscle spasm of back: Secondary | ICD-10-CM | POA: Diagnosis not present

## 2022-11-17 DIAGNOSIS — M546 Pain in thoracic spine: Secondary | ICD-10-CM | POA: Diagnosis not present

## 2022-11-17 DIAGNOSIS — I6523 Occlusion and stenosis of bilateral carotid arteries: Secondary | ICD-10-CM | POA: Diagnosis not present

## 2022-11-17 DIAGNOSIS — E042 Nontoxic multinodular goiter: Secondary | ICD-10-CM | POA: Diagnosis not present

## 2022-11-17 DIAGNOSIS — M6281 Muscle weakness (generalized): Secondary | ICD-10-CM

## 2022-11-17 DIAGNOSIS — R221 Localized swelling, mass and lump, neck: Secondary | ICD-10-CM | POA: Insufficient documentation

## 2022-11-17 DIAGNOSIS — M5459 Other low back pain: Secondary | ICD-10-CM | POA: Diagnosis not present

## 2022-11-17 DIAGNOSIS — K118 Other diseases of salivary glands: Secondary | ICD-10-CM | POA: Diagnosis not present

## 2022-11-17 DIAGNOSIS — R590 Localized enlarged lymph nodes: Secondary | ICD-10-CM | POA: Insufficient documentation

## 2022-11-17 DIAGNOSIS — R293 Abnormal posture: Secondary | ICD-10-CM | POA: Diagnosis not present

## 2022-11-17 MED ORDER — IOHEXOL 300 MG/ML  SOLN
100.0000 mL | Freq: Once | INTRAMUSCULAR | Status: AC | PRN
  Administered 2022-11-17: 75 mL via INTRAVENOUS

## 2022-11-17 NOTE — Therapy (Signed)
OUTPATIENT PHYSICAL THERAPY TREATMENT   Patient Name: Jillian Hayes MRN: [id number] DOB:03-29-1945, 77 y.o., female Today's Date: 11/17/2022  END OF SESSION:  PT End of Session - 11/17/22 1537     Visit Number 5    Date for PT Re-Evaluation 12/03/22    Progress Note Due on Visit 10    PT Start Time 1534    PT Stop Time 1615    PT Time Calculation (min) 41 min    Activity Tolerance Patient limited by pain;Patient tolerated treatment well    Behavior During Therapy [facility] for tasks assessed/performed             Past Medical History:  Diagnosis Date   Advanced care planning/counseling discussion 06/10/2014   05/31/2014 patient presents copy of HCP and Living Will   Anemia    iron deficiency   Anxiety    Arthritis    "in hands"   Atrial fibrillation (HCC)    Benign fundic gland polyps of stomach    Bladder polyps 06/25/2010   Chronic headaches 06/24/2010   Complication of anesthesia    "woke up at the end of a cyst removal surgery in 1991"   Depression 1991   hospitalized   Diverticulosis    DVT of right axillary vein, acute (HCC) 08/07/2022   ESRD (end stage renal disease) (HCC) 11/09/2015   HD on MWF   External prolapsed hemorrhoids    History of chicken pox 06/25/2010   Hypercalcemia 02/18/2014   Hypertension    Insomnia 06/24/2010   Multiple chemical sensitivity syndrome 06/25/2010   Proteinuria 02/18/2014   Renal insufficiency 03/26/2011   Valvular heart disease 04/28/2016   Past Surgical History:  Procedure Laterality Date   AV FISTULA PLACEMENT Left    x4   COLONOSCOPY  2014   cyst on left breast removed Left 1991   benign   ESOPHAGOGASTRODUODENOSCOPY (EGD) WITH ESOPHAGEAL DILATION  2014   LAPAROSCOPIC BILATERAL SALPINGO OOPHERECTOMY Bilateral 08/20/2020   Procedure: LAPAROSCOPIC BILATERAL SALPINGO OOPHORECTOMY;  Surgeon: Jerene Bears, MD;  Location: [facility] OR;  Service: Gynecology;  Laterality: Bilateral;   NASAL SEPTUM SURGERY  1986   rhinoplasty    polyps on bladder removed  1972   benign   RIGHT HEART CATH N/A 04/10/2021   Procedure: RIGHT HEART CATH;  Surgeon: Laurey Morale, MD;  Location: [facility] INVASIVE CV LAB;  Service: Cardiovascular;  Laterality: N/A;   TONSILLECTOMY  1962   Patient Active Problem List   Diagnosis Date Noted   DVT of right axillary vein, acute (HCC) 08/07/2022   Swelling of upper arm 08/03/2022   Abnormal INR 08/03/2022   SOB (shortness of breath) 07/28/2022   Urinary hesitancy 07/28/2022   Osteoporosis 07/28/2022   Right shoulder pain 07/27/2022   Dialysis AV fistula malfunction (HCC) 06/30/2022   Upper back pain 05/05/2022   Neck pain 05/05/2022   Hyperglycemia 05/05/2022   Disorder of vitamin B12 05/05/2022   Pulmonary hypertension, primary (HCC) 03/09/2021   Low TSH level 03/09/2021   Right knee pain 03/06/2021   Primary osteoarthritis of right knee 02/25/2021   Bilateral carotid artery stenosis 11/21/2020   Adnexal mass    Left foot drop 02/08/2020   A-V fistula (HCC) 08/03/2019   Nonrheumatic mitral valve regurgitation 06/12/2019   Edema 04/09/2019   Drug-induced constipation 10/18/2018   External prolapsed hemorrhoids 09/25/2018   Malignant hypertension 04/20/2018   Hyperphosphatemia 03/09/2018   Leg swelling 03/03/2018   Long term (current) use of anticoagulants 11/01/2017  Atrial fibrillation (HCC) 10/25/2017   Hyperparathyroidism, unspecified (HCC) 05/04/2017   DDD (degenerative disc disease), lumbar 04/07/2017   Hearing loss 01/04/2017   Hyperlipidemia 01/04/2017   Arthritis of fingers of both hands 10/01/2016   Chronic rhinitis 10/01/2016   Nonrheumatic aortic valve insufficiency 09/29/2016   Bruit 09/29/2016   Palpitation 09/22/2016   Aortic valve disease 04/28/2016   Anemia in ESRD (end-stage renal disease) (HCC) 03/03/2016   Sun-damaged skin 01/21/2016   ESRD (end stage renal disease) (HCC) 11/09/2015   Chronic kidney disease, stage 5 (HCC) 07/03/2015   Advanced care  planning/counseling discussion 06/10/2014   Pedal edema 03/28/2014   Proteinuria 02/18/2014   Hypercalcemia 02/18/2014   Abnormal EKG 01/22/2014   HTN (hypertension) 01/22/2014   Dermatitis 06/30/2013   Abnormal thyroid function test 06/25/2013   Cervical cancer screening 06/10/2012   Preventative health care 11/09/2011   Multiple chemical sensitivity syndrome 06/25/2010   History of chicken pox 06/25/2010   Bladder polyps 06/25/2010   History of cardiac dysrhythmia 06/25/2010   Insomnia 06/24/2010   Fatigue 06/24/2010   Chronic headaches 06/24/2010   Allergy    Anemia    Depression     PCP: Reuel Derby, MD  REFERRING PROVIDER: Richardean Sale, DO  REFERRING DIAG: scapulothoracic bursitis L  Rationale for Evaluation and Treatment: Rehabilitation  THERAPY DIAG:  Pain in thoracic spine  Abnormal posture  Muscle spasm of back  Muscle weakness (generalized)  ONSET DATE: chronic  SUBJECTIVE:                                                                                                                                                                                           SUBJECTIVE STATEMENT: Been doing well except for the last two days, pain in L shoulder blade has kept her up.    PERTINENT HISTORY:  Chronic L periscapular pain x 1 year after driving to visit family. Has tried chiropractic, injections, no relief.  Wants to try dry needling again.  Unable to tolerate sleeping or sitting in dialysis chair  PAIN:  Are you having pain? Yes: NPRS scale: 4/10 Pain location: L medial scapular border, now radiating into L lateral lower ribs Pain description: constant worse sitting soft chair and sleep Aggravating factors: see above Relieving factors: ice  PRECAUTIONS: Other: osteoporosis  RED FLAGS: None   WEIGHT BEARING RESTRICTIONS: No  FALLS:  Has patient fallen in last 6 months? No  LIVING ENVIRONMENT: Lives with: lives with their family Lives in:  House/apartment Stairs: No Has following equipment at home: None  OCCUPATION: retired  PLOF: Independent  PATIENT GOALS: sleep better   NEXT MD  VISIT: 2 weeks  OBJECTIVE:   DIAGNOSTIC FINDINGS:  na  PATIENT SURVEYS:  Modified Oswestry 25/50   SCREENING FOR RED FLAGS:scapulothoracic bursitis L Bowel or bladder incontinence: No Spinal tumors: No Cauda equina syndrome: No Compression fracture: No Abdominal aneurysm: No  COGNITION: Overall cognitive status: Within functional limits for tasks assessed     SENSATION: WFL  POSTURE:  scoliosis, L shoulder elevated, slight rotation to R thoracic spine  PALPATION: "Sore" tender L lateral thoracic paraspinals, periscapular, supraspinatus PA segmental testing thoracic spine decreased mobility T 3, T 7. Lateral and post ribs with normal mobility, tender on L post lower ribs  LUMBAR ROM: NT Thoracic ROM wfl  all directions  Cervical spine wfl all directions Decreased movement noted with isolation of L 2st rib, seated cervical flex/SB   B shoulders wfl but R sholder with poor mechanics for final 15 degrees of movement MMT: R shoulder flex, abd 4/5 Otherwise symmetrical , wfl   GAIT: Distance walked: 25' in clnic, wnl  TODAY'S TREATMENT:                                                                                                                              DATE:   11/17/22 Therapeutic Exercise: to improve strength and mobility.  Demo, verbal and tactile cues throughout for technique. Nustep L4 x 4 min  Manual Therapy: to decrease muscle spasm and pain and improve mobility STM/TPR to bil UT, L/S, rhomboids, cervical paraspinals, UPA mobs cervical spine, skilled palpation and monitoring during dry needling. Trigger Point Dry-Needling  Treatment instructions: Expect mild to moderate muscle soreness. S/S of pneumothorax if dry needled over a lung field, and to seek immediate medical attention should they occur. Patient  verbalized understanding of these instructions and education. Patient Consent Given: Yes Education handout provided: Previously provided Muscles treated: bil UT, L/S, bil C5-6 multifidi Electrical stimulation performed: No Parameters: N/A Treatment response/outcome: Twitch Response Elicited and Palpable Increase in Muscle Length  11/03/22 Therapeutic Exercise: to improve strength and mobility.  Demo, verbal and tactile cues throughout for technique. Nustep L4 x 5 min  QL stretch sitting UT twists Scap squeezes Shoulder rolls Seated horizontal abduction x 10  At wall - open books - L side, increased pain on R side Wall slides into flexion Shoulder press into ball - r side to center humeral head Manual Therapy: to decrease muscle spasm and pain and improve mobility Gentle PROM R shoulder all directions, STM/TPR to R pectoralis and R L/S  10/27/22 Manual Therapy: to decrease muscle spasm and pain and improve mobility STM/TPR to cervical paraspinals, L UT, L L/S, L thoracic erector spinae, L latissimus.  Mobs to thoracic spine (PA and UPA grade 1-2),     Kinesiotaping 2 pieces in x pattern extending from post shoulder to just superior to opposite iliac crest.  1  piece along L thoracic erector spinae, and another along L UT and L/S (split Y).   25%  stretch, 0 stretch on ends.    Skilled palpation and monitoring during dry needling. Trigger Point Dry-Needling  Treatment instructions: Expect mild to moderate muscle soreness. S/S of pneumothorax if dry needled over a lung field, and to seek immediate medical attention should they occur. Patient verbalized understanding of these instructions and education. Patient Consent Given: Yes Education handout provided: Previously provided Muscles treated: L UT, LS, L thoracic multifidi T8-10, L erector spinae Electrical stimulation performed: No Parameters: N/A Treatment response/outcome: Twitch Response Elicited and Palpable Increase in Muscle  Length    PATIENT EDUCATION:  Education details: HEP update Person educated: Patient Education method: Explanation, Demonstration, Verbal cues, and Handouts Education comprehension: verbalized understanding and returned demonstration  HOME EXERCISE PROGRAM: Access Code: [email or web address] URL: [email or web address] Date: 11/03/2022 Prepared by: Harrie Foreman  Exercises - Seated Quadratus Lumborum Stretch in Chair  - 1 x daily - 7 x weekly - 1 sets - 3 reps - Seated Trunk Rotation Stretch  - 1 x daily - 7 x weekly - 1 sets - 10 reps - Seated Shoulder Horizontal Abduction - Thumbs Up  - 1 x daily - 7 x weekly - 1 sets - 10 reps - Standing Thoracic Open Book at Wall  - 1 x daily - 7 x weekly - 1 sets - 10 reps - Standing shoulder flexion wall slides  - 1 x daily - 7 x weekly - 1 sets - 10 reps - Seated Shoulder Rolls  - 1 x daily - 7 x weekly - 1 sets - 10 reps  ASSESSMENT:  CLINICAL IMPRESSION: Jillian Hayes reports overall significant improvement in pain, however had large palpable trigger point in L levator today and increased tightness throughout bil UT and cervical paraspinals compared to last session, so focused on manual therapy and TrDN today to reduce muscle spasm and pain.  Jillian Hayes continues to demonstrate potential for improvement and would benefit from continued skilled therapy to address impairments.     OBJECTIVE IMPAIRMENTS: decreased activity tolerance, decreased strength, increased fascial restrictions, impaired perceived functional ability, impaired UE functional use, and pain.   ACTIVITY LIMITATIONS: carrying, lifting, standing, sleeping, and reach over head  PARTICIPATION LIMITATIONS: meal prep  PERSONAL FACTORS: Age, Behavior pattern, Past/current experiences, Time since onset of injury/illness/exacerbation, and 3+ comorbidities: ESRD, osteoporosis,   are also affecting patient's functional outcome.   REHAB POTENTIAL: Fair    CLINICAL DECISION  MAKING: Evolving/moderate complexity  EVALUATION COMPLEXITY: Moderate   GOALS: Goals reviewed with patient? Yes  SHORT TERM GOALS: Target date: 11/10/22  I HEP Baseline: Goal status: IN PROGRESS 11/03/22- given 11/17/22 compliant   LONG TERM GOALS: Target date: 12/03/22  Modified Oswestry improve to 15/50 Baseline: 25/50 Goal status: IN PROGRESS  2.  Strength B shoulder horizontal abd(middle traps, and lower traps 5/5) Baseline: TBD Goal status: IN PROGRESS  3.  Sleeping improve to greater than 2 hrs without pain L scapulothoracic region Baseline:  Goal status: IN PROGRESS 11/17/22- was sleeping better but worse last 2 days.   PLAN:  PT FREQUENCY: 1x/week  PT DURATION: 8 weeks  PLANNED INTERVENTIONS: Therapeutic exercises, Therapeutic activity, Neuromuscular re-education, Balance training, Gait training, Patient/Family education, Self Care, and Joint mobilization.  PLAN FOR NEXT SESSION: continue to progress therex as tolerated, manual/TrDN as needed.    Jena Gauss, PT, DPT  11/17/2022, 4:44 PM

## 2022-11-19 ENCOUNTER — Other Ambulatory Visit: Payer: Self-pay | Admitting: *Deleted

## 2022-11-19 ENCOUNTER — Inpatient Hospital Stay: Payer: Medicare HMO | Admitting: Hematology & Oncology

## 2022-11-19 ENCOUNTER — Encounter: Payer: Self-pay | Admitting: Hematology & Oncology

## 2022-11-19 ENCOUNTER — Other Ambulatory Visit: Payer: Self-pay

## 2022-11-19 ENCOUNTER — Inpatient Hospital Stay: Payer: Medicare HMO | Attending: Hematology & Oncology

## 2022-11-19 VITALS — BP 168/55 | HR 64 | Temp 98.3°F | Resp 18 | Ht 66.0 in | Wt 131.1 lb

## 2022-11-19 DIAGNOSIS — I82A11 Acute embolism and thrombosis of right axillary vein: Secondary | ICD-10-CM

## 2022-11-19 DIAGNOSIS — Z7901 Long term (current) use of anticoagulants: Secondary | ICD-10-CM | POA: Diagnosis not present

## 2022-11-19 DIAGNOSIS — D110 Benign neoplasm of parotid gland: Secondary | ICD-10-CM | POA: Diagnosis not present

## 2022-11-19 DIAGNOSIS — I82C11 Acute embolism and thrombosis of right internal jugular vein: Secondary | ICD-10-CM | POA: Insufficient documentation

## 2022-11-19 DIAGNOSIS — H7292 Unspecified perforation of tympanic membrane, left ear: Secondary | ICD-10-CM | POA: Diagnosis not present

## 2022-11-19 DIAGNOSIS — D490 Neoplasm of unspecified behavior of digestive system: Secondary | ICD-10-CM | POA: Diagnosis not present

## 2022-11-19 DIAGNOSIS — H6992 Unspecified Eustachian tube disorder, left ear: Secondary | ICD-10-CM | POA: Diagnosis not present

## 2022-11-19 LAB — CBC WITH DIFFERENTIAL (CANCER CENTER ONLY)
Abs Immature Granulocytes: 0.01 10*3/uL (ref 0.00–0.07)
Basophils Absolute: 0 10*3/uL (ref 0.0–0.1)
Basophils Relative: 1 %
Eosinophils Absolute: 0.1 10*3/uL (ref 0.0–0.5)
Eosinophils Relative: 2 %
HCT: 35.3 % — ABNORMAL LOW (ref 36.0–46.0)
Hemoglobin: 10.9 g/dL — ABNORMAL LOW (ref 12.0–15.0)
Immature Granulocytes: 0 %
Lymphocytes Relative: 30 %
Lymphs Abs: 1.6 10*3/uL (ref 0.7–4.0)
MCH: 31.7 pg (ref 26.0–34.0)
MCHC: 30.9 g/dL (ref 30.0–36.0)
MCV: 102.6 fL — ABNORMAL HIGH (ref 80.0–100.0)
Monocytes Absolute: 0.8 10*3/uL (ref 0.1–1.0)
Monocytes Relative: 14 %
Neutro Abs: 2.8 10*3/uL (ref 1.7–7.7)
Neutrophils Relative %: 53 %
Platelet Count: 211 10*3/uL (ref 150–400)
RBC: 3.44 MIL/uL — ABNORMAL LOW (ref 3.87–5.11)
RDW: 17.8 % — ABNORMAL HIGH (ref 11.5–15.5)
WBC Count: 5.3 10*3/uL (ref 4.0–10.5)
nRBC: 0 % (ref 0.0–0.2)

## 2022-11-19 LAB — CMP (CANCER CENTER ONLY)
ALT: 11 U/L (ref 0–44)
AST: 20 U/L (ref 15–41)
Albumin: 3.9 g/dL (ref 3.5–5.0)
Alkaline Phosphatase: 76 U/L (ref 38–126)
Anion gap: 11 (ref 5–15)
BUN: 37 mg/dL — ABNORMAL HIGH (ref 8–23)
CO2: 29 mmol/L (ref 22–32)
Calcium: 9.5 mg/dL (ref 8.9–10.3)
Chloride: 100 mmol/L (ref 98–111)
Creatinine: 5.8 mg/dL — ABNORMAL HIGH (ref 0.44–1.00)
GFR, Estimated: 7 mL/min — ABNORMAL LOW (ref >60)
Glucose, Bld: 97 mg/dL (ref 70–99)
Potassium: 5 mmol/L (ref 3.5–5.1)
Sodium: 140 mmol/L (ref 135–145)
Total Bilirubin: 0.4 mg/dL (ref 0.3–1.2)
Total Protein: 6 g/dL — ABNORMAL LOW (ref 6.5–8.1)

## 2022-11-19 NOTE — Progress Notes (Signed)
Hematology and Oncology Follow Up Visit  Jillian Hayes [id number] September 26, 1945 78 y.o. 11/19/2022   Principle Diagnosis:  Thromboembolic disease of the right jugular vein  Current Therapy:   Eliquis 5 mg p.o. twice daily     Interim History:  Jillian Hayes is back for follow-up.  The new problem that she may have be having is that there might be some type of tumor just under the left ear.  She apparently had fluid drawn out of there earlier today.  She had a CT of the neck that was done 2 days ago.  Unfortunately, this has not yet been read.  Hopefully, this is some that is not malignant.  She is doing dialysis.  She is really having no problems with dialysis..  She continues on Eliquis.  She is doing well on Eliquis without any bleeding.  Her appetite has been okay.  She has had no nausea or vomiting.  In fact, she is going go home and have oysters.  Her birthday is [birth year].  She will have an early birthday celebration.  She has had no leg swelling.  There is been no rashes.  She has had no fever.  She has had no headache.  Overall, I would have to say that her performance status is probably ECOG 1.   Medications:  Current Outpatient Medications:    acetaminophen (TYLENOL) 500 MG tablet, Take 500 mg by mouth every 6 (six) hours as needed for headache (pain)., Disp: , Rfl:    Alfalfa 500 MG TABS, Take 500 mg by mouth 3 (three) times daily., Disp: , Rfl:    apixaban (ELIQUIS) 5 MG TABS tablet, Take 1 tablet (5 mg total) by mouth 2 (two) times daily., Disp: 60 tablet, Rfl: 6   atorvastatin (LIPITOR) 20 MG tablet, Take 20 mg by mouth at bedtime., Disp: , Rfl:    carvedilol (COREG) 25 MG tablet, Take 1 tablet (25 mg total) by mouth 2 (two) times daily., Disp: 180 tablet, Rfl: 3   cinacalcet (SENSIPAR) 30 MG tablet, Take 30 mg by mouth at bedtime., Disp: , Rfl:    diltiazem (CARDIZEM CD) 240 MG 24 hr capsule, Take 240 mg by mouth daily with lunch., Disp: , Rfl:    hydrALAZINE (APRESOLINE) 50  MG tablet, Take 75 mg by mouth 2 (two) times daily., Disp: , Rfl:    L-Glutamine 500 MG CAPS, Take 500 mg by mouth every evening., Disp: , Rfl:    lidocaine-prilocaine (EMLA) cream, Apply 1 application  topically every Monday, Wednesday, and Friday with hemodialysis., Disp: , Rfl:    OVER THE COUNTER MEDICATION, Take 3 capsules by mouth every evening. Shaklee Herb-Lax Natural Laxative for Colon Cleanse (Senna, Licorice, and Alfalfa), Disp: , Rfl:    sevelamer carbonate (RENVELA) 800 MG tablet, Take 2,400 mg by mouth See admin instructions. Take 3 tablets (2400 mg) by mouth with each meal & take 2 tablets (1600 mg) by mouth with each large snack, Disp: , Rfl:    vitamin C (ASCORBIC ACID) 500 MG tablet, Take 500 mg by mouth daily., Disp: , Rfl:   Allergies:  Allergies  Allergen Reactions   Clonidine Derivatives Palpitations   Sulfa Antibiotics Nausea And Vomiting    Past Medical History, Surgical history, Social history, and Family History were reviewed and updated.  Review of Systems: Review of Systems  Constitutional: Negative.   HENT:  Negative.    Eyes: Negative.   Respiratory: Negative.    Cardiovascular: Negative.   Gastrointestinal: Negative.  Endocrine: Negative.   Genitourinary: Negative.    Musculoskeletal: Negative.   Skin: Negative.   Neurological: Negative.   Hematological: Negative.   Psychiatric/Behavioral:  Positive for sleep disturbance.     Physical Exam:  height is 5\' 6"  (1.676 m) and weight is 131 lb 1.9 oz (59.5 kg). Her oral temperature is 98.3 F (36.8 C). Her blood pressure is 168/55 (abnormal) and her pulse is 64. Her respiration is 18 and oxygen saturation is 98%.   Wt Readings from Last 3 Encounters:  11/19/22 131 lb 1.9 oz (59.5 kg)  11/12/22 135 lb (61.2 kg)  10/23/22 131 lb (59.4 kg)    Physical Exam Vitals reviewed.  HENT:     Head: Normocephalic and atraumatic.  Eyes:     Pupils: Pupils are equal, round, and reactive to light.   Cardiovascular:     Rate and Rhythm: Normal rate and regular rhythm.     Heart sounds: Normal heart sounds.  Pulmonary:     Effort: Pulmonary effort is normal.     Breath sounds: Normal breath sounds.  Abdominal:     General: Bowel sounds are normal.     Palpations: Abdomen is soft.  Musculoskeletal:        General: No tenderness or deformity. Normal range of motion.     Cervical back: Normal range of motion.  Lymphadenopathy:     Cervical: No cervical adenopathy.  Skin:    General: Skin is warm and dry.     Findings: No erythema or rash.  Neurological:     Mental Status: She is alert and oriented to person, place, and time.  Psychiatric:        Behavior: Behavior normal.        Thought Content: Thought content normal.        Judgment: Judgment normal.    Lab Results  Component Value Date   WBC 5.3 11/19/2022   HGB 10.9 (L) 11/19/2022   HCT 35.3 (L) 11/19/2022   MCV 102.6 (H) 11/19/2022   PLT 211 11/19/2022     Chemistry      Component Value Date/Time   NA 140 11/19/2022 1507   NA 145 (H) 04/01/2021 1608   NA 140 03/25/2016 1111   K 5.0 11/19/2022 1507   K 5.9 No visable hemolysis (H) 03/25/2016 1111   CL 100 11/19/2022 1507   CO2 29 11/19/2022 1507   CO2 23 03/25/2016 1111   BUN 37 (H) 11/19/2022 1507   BUN 35 (H) 04/01/2021 1608   BUN 51.8 (H) 03/25/2016 1111   CREATININE 5.80 (H) 11/19/2022 1507   CREATININE 2.4 (H) 03/25/2016 1111   GLU 0 12/06/2019 0000      Component Value Date/Time   CALCIUM 9.5 11/19/2022 1507   CALCIUM 10.5 (H) 03/25/2016 1111   ALKPHOS 76 11/19/2022 1507   ALKPHOS 73 03/25/2016 1111   AST 20 11/19/2022 1507   AST 26 03/25/2016 1111   ALT 11 11/19/2022 1507   ALT 19 03/25/2016 1111   BILITOT 0.4 11/19/2022 1507   BILITOT 0.35 03/25/2016 1111      Impression and Plan: Jillian Hayes is a very nice 77 year old white female.  She developed a thrombus in the right internal jugular vein.  This was back in early July.  It will be  interesting to see if there is anything that comes out of this fluid.  We will have to see what the CT scan shows.  Hopefully, there is nothing that is going to  be malignant.  I would think that the CT scan should help [place] with respect to the thrombus that send the right internal jugular vein.  We will see if anything is found with this.  Overall, if I need to get her back to see [place] sometime after Thanksgiving.   Josph Macho, MD 10/17/20244:19 PM

## 2022-11-24 ENCOUNTER — Encounter: Payer: Self-pay | Admitting: Physical Therapy

## 2022-11-24 ENCOUNTER — Ambulatory Visit: Payer: Medicare HMO | Admitting: Physical Therapy

## 2022-11-24 DIAGNOSIS — M546 Pain in thoracic spine: Secondary | ICD-10-CM | POA: Diagnosis not present

## 2022-11-24 DIAGNOSIS — R293 Abnormal posture: Secondary | ICD-10-CM | POA: Diagnosis not present

## 2022-11-24 DIAGNOSIS — M5459 Other low back pain: Secondary | ICD-10-CM | POA: Diagnosis not present

## 2022-11-24 DIAGNOSIS — M62830 Muscle spasm of back: Secondary | ICD-10-CM

## 2022-11-24 DIAGNOSIS — M6281 Muscle weakness (generalized): Secondary | ICD-10-CM | POA: Diagnosis not present

## 2022-11-24 NOTE — Therapy (Signed)
OUTPATIENT PHYSICAL THERAPY TREATMENT   Patient Name: Jillian Hayes MRN: [id number] DOB:23-Feb-1945, 77 y.o., female Today's Date: 11/24/2022  END OF SESSION:  PT End of Session - 11/24/22 1454     Visit Number 6    Date for PT Re-Evaluation 12/03/22    Progress Note Due on Visit 10    PT Start Time 1450    PT Stop Time 1533    PT Time Calculation (min) 43 min    Activity Tolerance Patient limited by pain;Patient tolerated treatment well    Behavior During Therapy [facility] for tasks assessed/performed             Past Medical History:  Diagnosis Date   Advanced care planning/counseling discussion 06/10/2014   05/31/2014 patient presents copy of HCP and Living Will   Anemia    iron deficiency   Anxiety    Arthritis    "in hands"   Atrial fibrillation (HCC)    Benign fundic gland polyps of stomach    Bladder polyps 06/25/2010   Chronic headaches 06/24/2010   Complication of anesthesia    "woke up at the end of a cyst removal surgery in 1991"   Depression 1991   hospitalized   Diverticulosis    DVT of right axillary vein, acute (HCC) 08/07/2022   ESRD (end stage renal disease) (HCC) 11/09/2015   HD on MWF   External prolapsed hemorrhoids    History of chicken pox 06/25/2010   Hypercalcemia 02/18/2014   Hypertension    Insomnia 06/24/2010   Multiple chemical sensitivity syndrome 06/25/2010   Proteinuria 02/18/2014   Renal insufficiency 03/26/2011   Valvular heart disease 04/28/2016   Past Surgical History:  Procedure Laterality Date   AV FISTULA PLACEMENT Left    x4   COLONOSCOPY  2014   cyst on left breast removed Left 1991   benign   ESOPHAGOGASTRODUODENOSCOPY (EGD) WITH ESOPHAGEAL DILATION  2014   LAPAROSCOPIC BILATERAL SALPINGO OOPHERECTOMY Bilateral 08/20/2020   Procedure: LAPAROSCOPIC BILATERAL SALPINGO OOPHORECTOMY;  Surgeon: Jerene Bears, MD;  Location: [facility] OR;  Service: Gynecology;  Laterality: Bilateral;   NASAL SEPTUM SURGERY  1986   rhinoplasty    polyps on bladder removed  1972   benign   RIGHT HEART CATH N/A 04/10/2021   Procedure: RIGHT HEART CATH;  Surgeon: Laurey Morale, MD;  Location: [facility] INVASIVE CV LAB;  Service: Cardiovascular;  Laterality: N/A;   TONSILLECTOMY  1962   Patient Active Problem List   Diagnosis Date Noted   DVT of right axillary vein, acute (HCC) 08/07/2022   Swelling of upper arm 08/03/2022   Abnormal INR 08/03/2022   SOB (shortness of breath) 07/28/2022   Urinary hesitancy 07/28/2022   Osteoporosis 07/28/2022   Right shoulder pain 07/27/2022   Dialysis AV fistula malfunction (HCC) 06/30/2022   Upper back pain 05/05/2022   Neck pain 05/05/2022   Hyperglycemia 05/05/2022   Disorder of vitamin B12 05/05/2022   Pulmonary hypertension, primary (HCC) 03/09/2021   Low TSH level 03/09/2021   Right knee pain 03/06/2021   Primary osteoarthritis of right knee 02/25/2021   Bilateral carotid artery stenosis 11/21/2020   Adnexal mass    Left foot drop 02/08/2020   A-V fistula (HCC) 08/03/2019   Nonrheumatic mitral valve regurgitation 06/12/2019   Edema 04/09/2019   Drug-induced constipation 10/18/2018   External prolapsed hemorrhoids 09/25/2018   Malignant hypertension 04/20/2018   Hyperphosphatemia 03/09/2018   Leg swelling 03/03/2018   Long term (current) use of anticoagulants 11/01/2017  Atrial fibrillation (HCC) 10/25/2017   Hyperparathyroidism, unspecified (HCC) 05/04/2017   DDD (degenerative disc disease), lumbar 04/07/2017   Hearing loss 01/04/2017   Hyperlipidemia 01/04/2017   Arthritis of fingers of both hands 10/01/2016   Chronic rhinitis 10/01/2016   Nonrheumatic aortic valve insufficiency 09/29/2016   Bruit 09/29/2016   Palpitation 09/22/2016   Aortic valve disease 04/28/2016   Anemia in ESRD (end-stage renal disease) (HCC) 03/03/2016   Sun-damaged skin 01/21/2016   ESRD (end stage renal disease) (HCC) 11/09/2015   Chronic kidney disease, stage 5 (HCC) 07/03/2015   Advanced care  planning/counseling discussion 06/10/2014   Pedal edema 03/28/2014   Proteinuria 02/18/2014   Hypercalcemia 02/18/2014   Abnormal EKG 01/22/2014   HTN (hypertension) 01/22/2014   Dermatitis 06/30/2013   Abnormal thyroid function test 06/25/2013   Cervical cancer screening 06/10/2012   Preventative health care 11/09/2011   Multiple chemical sensitivity syndrome 06/25/2010   History of chicken pox 06/25/2010   Bladder polyps 06/25/2010   History of cardiac dysrhythmia 06/25/2010   Insomnia 06/24/2010   Fatigue 06/24/2010   Chronic headaches 06/24/2010   Allergy    Anemia    Depression     PCP: Reuel Derby, MD  REFERRING PROVIDER: Richardean Sale, DO  REFERRING DIAG: scapulothoracic bursitis L  Rationale for Evaluation and Treatment: Rehabilitation  THERAPY DIAG:  Pain in thoracic spine  Abnormal posture  Muscle spasm of back  Muscle weakness (generalized)  Other low back pain  ONSET DATE: chronic  SUBJECTIVE:                                                                                                                                                                                           SUBJECTIVE STATEMENT: Tired today.   Was doing ok, but then yesterday started having that shoulder blade pain agai which kept her up last night.  She did have several days without pain.   Patches help, but when her back hurts they don't help.   PERTINENT HISTORY:  Chronic L periscapular pain x 1 year after driving to visit family. Has tried chiropractic, injections, no relief.  Wants to try dry needling again.  Unable to tolerate sleeping or sitting in dialysis chair  PAIN:  Are you having pain? Yes: NPRS scale: 5/10 Pain location: L medial scapular border, now radiating into L lateral lower ribs Pain description: constant worse sitting soft chair and sleep Aggravating factors: see above Relieving factors: ice  PRECAUTIONS: Other: osteoporosis  RED  FLAGS: None   WEIGHT BEARING RESTRICTIONS: No  FALLS:  Has patient fallen in last 6 months? No  LIVING ENVIRONMENT: Lives with: lives  with their family Lives in: House/apartment Stairs: No Has following equipment at home: None  OCCUPATION: retired  PLOF: Independent  PATIENT GOALS: sleep better   NEXT MD VISIT: 2 weeks  OBJECTIVE:   DIAGNOSTIC FINDINGS:  na  PATIENT SURVEYS:  Modified Oswestry 25/50   SCREENING FOR RED FLAGS:scapulothoracic bursitis L Bowel or bladder incontinence: No Spinal tumors: No Cauda equina syndrome: No Compression fracture: No Abdominal aneurysm: No  COGNITION: Overall cognitive status: Within functional limits for tasks assessed     SENSATION: WFL  POSTURE:  scoliosis, L shoulder elevated, slight rotation to R thoracic spine  PALPATION: "Sore" tender L lateral thoracic paraspinals, periscapular, supraspinatus PA segmental testing thoracic spine decreased mobility T 3, T 7. Lateral and post ribs with normal mobility, tender on L post lower ribs  LUMBAR ROM: NT Thoracic ROM wfl  all directions  Cervical spine wfl all directions Decreased movement noted with isolation of L 2st rib, seated cervical flex/SB   B shoulders wfl but R sholder with poor mechanics for final 15 degrees of movement MMT: R shoulder flex, abd 4/5 Otherwise symmetrical , wfl   GAIT: Distance walked: 32' in clnic, wnl  TODAY'S TREATMENT:                                                                                                                              DATE:   11/24/22  Therapeutic Exercise: to improve strength and mobility.  Demo, verbal and tactile cues throughout for technique. Nustep L5 x 4 min  Manual Therapy: to decrease muscle spasm and pain and improve mobility STM/TPR to bil UT, L/S, rhomboids, cervical paraspinals, UPA mobs cervical spine, skilled palpation and monitoring during dry needling. Kinesiotaping - Y tape applied to L levator  scapule, 25% stretch, 0 stretch on anchors.  Trigger Point Dry-Needling  Treatment instructions: Expect mild to moderate muscle soreness. S/S of pneumothorax if dry needled over a lung field, and to seek immediate medical attention should they occur. Patient verbalized understanding of these instructions and education. Patient Consent Given: Yes Education handout provided: Previously provided Muscles treated: bil UT, L/S, bil C5-6 multifidi Electrical stimulation performed: No Parameters: N/A Treatment response/outcome: Twitch Response Elicited and Palpable Increase in Muscle Length   11/17/22 Therapeutic Exercise: to improve strength and mobility.  Demo, verbal and tactile cues throughout for technique. Nustep L4 x 4 min  Manual Therapy: to decrease muscle spasm and pain and improve mobility STM/TPR to bil UT, L/S, rhomboids, cervical paraspinals, UPA mobs cervical spine, skilled palpation and monitoring during dry needling. Trigger Point Dry-Needling  Treatment instructions: Expect mild to moderate muscle soreness. S/S of pneumothorax if dry needled over a lung field, and to seek immediate medical attention should they occur. Patient verbalized understanding of these instructions and education. Patient Consent Given: Yes Education handout provided: Previously provided Muscles treated: bil UT, L/S, bil C5-6 multifidi Electrical stimulation performed: No Parameters: N/A Treatment response/outcome: Twitch Response Elicited and Palpable  Increase in Muscle Length  11/03/22 Therapeutic Exercise: to improve strength and mobility.  Demo, verbal and tactile cues throughout for technique. Nustep L4 x 5 min  QL stretch sitting UT twists Scap squeezes Shoulder rolls Seated horizontal abduction x 10  At wall - open books - L side, increased pain on R side Wall slides into flexion Shoulder press into ball - r side to center humeral head Manual Therapy: to decrease muscle spasm and pain and  improve mobility Gentle PROM R shoulder all directions, STM/TPR to R pectoralis and R L/S    PATIENT EDUCATION:  Education details: HEP update Person educated: Patient Education method: Explanation, Demonstration, Verbal cues, and Handouts Education comprehension: verbalized understanding and returned demonstration  HOME EXERCISE PROGRAM: Access Code: [email or web address] URL: [email or web address] Date: 11/03/2022 Prepared by: Harrie Foreman  Exercises - Seated Quadratus Lumborum Stretch in Chair  - 1 x daily - 7 x weekly - 1 sets - 3 reps - Seated Trunk Rotation Stretch  - 1 x daily - 7 x weekly - 1 sets - 10 reps - Seated Shoulder Horizontal Abduction - Thumbs Up  - 1 x daily - 7 x weekly - 1 sets - 10 reps - Standing Thoracic Open Book at Wall  - 1 x daily - 7 x weekly - 1 sets - 10 reps - Standing shoulder flexion wall slides  - 1 x daily - 7 x weekly - 1 sets - 10 reps - Seated Shoulder Rolls  - 1 x daily - 7 x weekly - 1 sets - 10 reps  ASSESSMENT:  CLINICAL IMPRESSION: SHEILDA ALDOUS reports will have several pain free days but then return of Levator trigger point causing pain and interupted sleep.  Again focused on manual therapy and TrDN today to reduce muscle spasm and pain, added K'tape to L levator as well.  KEOLA RABANALES continues to demonstrate potential for improvement and would benefit from continued skilled therapy to address impairments.     OBJECTIVE IMPAIRMENTS: decreased activity tolerance, decreased strength, increased fascial restrictions, impaired perceived functional ability, impaired UE functional use, and pain.   ACTIVITY LIMITATIONS: carrying, lifting, standing, sleeping, and reach over head  PARTICIPATION LIMITATIONS: meal prep  PERSONAL FACTORS: Age, Behavior pattern, Past/current experiences, Time since onset of injury/illness/exacerbation, and 3+ comorbidities: ESRD, osteoporosis,   are also affecting patient's functional outcome.   REHAB  POTENTIAL: Fair    CLINICAL DECISION MAKING: Evolving/moderate complexity  EVALUATION COMPLEXITY: Moderate   GOALS: Goals reviewed with patient? Yes  SHORT TERM GOALS: Target date: 11/10/22  I HEP Baseline: Goal status: IN PROGRESS 11/03/22- given 11/17/22 compliant   LONG TERM GOALS: Target date: 12/03/22  Modified Oswestry improve to 15/50 Baseline: 25/50 Goal status: IN PROGRESS  2.  Strength B shoulder horizontal abd(middle traps, and lower traps 5/5) Baseline: TBD Goal status: IN PROGRESS  3.  Sleeping improve to greater than 2 hrs without pain L scapulothoracic region Baseline:  Goal status: IN PROGRESS 11/17/22- was sleeping better but worse last 2 days.   PLAN:  PT FREQUENCY: 1x/week  PT DURATION: 8 weeks  PLANNED INTERVENTIONS: Therapeutic exercises, Therapeutic activity, Neuromuscular re-education, Balance training, Gait training, Patient/Family education, Self Care, and Joint mobilization.  PLAN FOR NEXT SESSION: PROGRESS NOTE  Jena Gauss, PT, DPT  11/24/2022, 3:43 PM

## 2022-12-01 ENCOUNTER — Encounter: Payer: Self-pay | Admitting: Physical Therapy

## 2022-12-01 ENCOUNTER — Ambulatory Visit: Payer: Medicare HMO | Admitting: Physical Therapy

## 2022-12-01 DIAGNOSIS — M62830 Muscle spasm of back: Secondary | ICD-10-CM | POA: Diagnosis not present

## 2022-12-01 DIAGNOSIS — M546 Pain in thoracic spine: Secondary | ICD-10-CM

## 2022-12-01 DIAGNOSIS — M5459 Other low back pain: Secondary | ICD-10-CM | POA: Diagnosis not present

## 2022-12-01 DIAGNOSIS — M6281 Muscle weakness (generalized): Secondary | ICD-10-CM | POA: Diagnosis not present

## 2022-12-01 DIAGNOSIS — R293 Abnormal posture: Secondary | ICD-10-CM

## 2022-12-01 NOTE — Therapy (Signed)
OUTPATIENT PHYSICAL THERAPY TREATMENT Progress Note Reporting Period 10/08/2022 to 12/01/22  See note below for Objective Data and Assessment of Progress/Goals.      Patient Name: Jillian Hayes MRN: [id number] DOB:1946-01-06, 77 y.o., female Today's Date: 12/01/2022  END OF SESSION:  PT End of Session - 12/01/22 1533     Visit Number 7    Date for PT Re-Evaluation 01/26/23    Progress Note Due on Visit 15    PT Start Time 1533    PT Stop Time 1618    PT Time Calculation (min) 45 min    Activity Tolerance Patient limited by pain;Patient tolerated treatment well    Behavior During Therapy [facility] for tasks assessed/performed             Past Medical History:  Diagnosis Date   Advanced care planning/counseling discussion 06/10/2014   05/31/2014 patient presents copy of HCP and Living Will   Anemia    iron deficiency   Anxiety    Arthritis    "in hands"   Atrial fibrillation (HCC)    Benign fundic gland polyps of stomach    Bladder polyps 06/25/2010   Chronic headaches 06/24/2010   Complication of anesthesia    "woke up at the end of a cyst removal surgery in 1991"   Depression 1991   hospitalized   Diverticulosis    DVT of right axillary vein, acute (HCC) 08/07/2022   ESRD (end stage renal disease) (HCC) 11/09/2015   HD on MWF   External prolapsed hemorrhoids    History of chicken pox 06/25/2010   Hypercalcemia 02/18/2014   Hypertension    Insomnia 06/24/2010   Multiple chemical sensitivity syndrome 06/25/2010   Proteinuria 02/18/2014   Renal insufficiency 03/26/2011   Valvular heart disease 04/28/2016   Past Surgical History:  Procedure Laterality Date   AV FISTULA PLACEMENT Left    x4   COLONOSCOPY  2014   cyst on left breast removed Left 1991   benign   ESOPHAGOGASTRODUODENOSCOPY (EGD) WITH ESOPHAGEAL DILATION  2014   LAPAROSCOPIC BILATERAL SALPINGO OOPHERECTOMY Bilateral 08/20/2020   Procedure: LAPAROSCOPIC BILATERAL SALPINGO OOPHORECTOMY;  Surgeon:  Jerene Bears, MD;  Location: [facility] OR;  Service: Gynecology;  Laterality: Bilateral;   NASAL SEPTUM SURGERY  1986   rhinoplasty   polyps on bladder removed  1972   benign   RIGHT HEART CATH N/A 04/10/2021   Procedure: RIGHT HEART CATH;  Surgeon: Laurey Morale, MD;  Location: [facility] INVASIVE CV LAB;  Service: Cardiovascular;  Laterality: N/A;   TONSILLECTOMY  1962   Patient Active Problem List   Diagnosis Date Noted   DVT of right axillary vein, acute (HCC) 08/07/2022   Swelling of upper arm 08/03/2022   Abnormal INR 08/03/2022   SOB (shortness of breath) 07/28/2022   Urinary hesitancy 07/28/2022   Osteoporosis 07/28/2022   Right shoulder pain 07/27/2022   Dialysis AV fistula malfunction (HCC) 06/30/2022   Upper back pain 05/05/2022   Neck pain 05/05/2022   Hyperglycemia 05/05/2022   Disorder of vitamin B12 05/05/2022   Pulmonary hypertension, primary (HCC) 03/09/2021   Low TSH level 03/09/2021   Right knee pain 03/06/2021   Primary osteoarthritis of right knee 02/25/2021   Bilateral carotid artery stenosis 11/21/2020   Adnexal mass    Left foot drop 02/08/2020   A-V fistula (HCC) 08/03/2019   Nonrheumatic mitral valve regurgitation 06/12/2019   Edema 04/09/2019   Drug-induced constipation 10/18/2018   External prolapsed hemorrhoids 09/25/2018   Malignant  hypertension 04/20/2018   Hyperphosphatemia 03/09/2018   Leg swelling 03/03/2018   Long term (current) use of anticoagulants 11/01/2017   Atrial fibrillation (HCC) 10/25/2017   Hyperparathyroidism, unspecified (HCC) 05/04/2017   DDD (degenerative disc disease), lumbar 04/07/2017   Hearing loss 01/04/2017   Hyperlipidemia 01/04/2017   Arthritis of fingers of both hands 10/01/2016   Chronic rhinitis 10/01/2016   Nonrheumatic aortic valve insufficiency 09/29/2016   Bruit 09/29/2016   Palpitation 09/22/2016   Aortic valve disease 04/28/2016   Anemia in ESRD (end-stage renal disease) (HCC) 03/03/2016   Sun-damaged skin  01/21/2016   ESRD (end stage renal disease) (HCC) 11/09/2015   Chronic kidney disease, stage 5 (HCC) 07/03/2015   Advanced care planning/counseling discussion 06/10/2014   Pedal edema 03/28/2014   Proteinuria 02/18/2014   Hypercalcemia 02/18/2014   Abnormal EKG 01/22/2014   HTN (hypertension) 01/22/2014   Dermatitis 06/30/2013   Abnormal thyroid function test 06/25/2013   Cervical cancer screening 06/10/2012   Preventative health care 11/09/2011   Multiple chemical sensitivity syndrome 06/25/2010   History of chicken pox 06/25/2010   Bladder polyps 06/25/2010   History of cardiac dysrhythmia 06/25/2010   Insomnia 06/24/2010   Fatigue 06/24/2010   Chronic headaches 06/24/2010   Allergy    Anemia    Depression     PCP: Reuel Derby, MD  REFERRING PROVIDER: Richardean Sale, DO  REFERRING DIAG: scapulothoracic bursitis L  Rationale for Evaluation and Treatment: Rehabilitation  THERAPY DIAG:  Pain in thoracic spine  Abnormal posture  Muscle spasm of back  Muscle weakness (generalized)  ONSET DATE: chronic  SUBJECTIVE:                                                                                                                                                                                           SUBJECTIVE STATEMENT: Shoulders are killing her today, kept her up all night long.  Dry needling helps for a couple of days before the pain returns.   PERTINENT HISTORY:  Chronic L periscapular pain x 1 year after driving to visit family. Has tried chiropractic, injections, no relief.  Wants to try dry needling again.  Unable to tolerate sleeping or sitting in dialysis chair  PAIN:  Are you having pain? Yes: NPRS scale: 7/10 Pain location: L medial scapular border, now radiating into L lateral lower ribs Pain description: constant worse sitting soft chair and sleep Aggravating factors: see above Relieving factors: ice  PRECAUTIONS: Other: osteoporosis  RED  FLAGS: None   WEIGHT BEARING RESTRICTIONS: No  FALLS:  Has patient fallen in last 6 months? No  LIVING ENVIRONMENT: Lives with: lives with their  family Lives in: House/apartment Stairs: No Has following equipment at home: None  OCCUPATION: retired  PLOF: Independent  PATIENT GOALS: sleep better   NEXT MD VISIT: 2 weeks  OBJECTIVE:   DIAGNOSTIC FINDINGS:  na  PATIENT SURVEYS:  Modified Oswestry 25/50   SCREENING FOR RED FLAGS:scapulothoracic bursitis L Bowel or bladder incontinence: No Spinal tumors: No Cauda equina syndrome: No Compression fracture: No Abdominal aneurysm: No  COGNITION: Overall cognitive status: Within functional limits for tasks assessed     SENSATION: WFL  POSTURE:  scoliosis, L shoulder elevated, slight rotation to R thoracic spine  PALPATION: "Sore" tender L lateral thoracic paraspinals, periscapular, supraspinatus PA segmental testing thoracic spine decreased mobility T 3, T 7. Lateral and post ribs with normal mobility, tender on L post lower ribs  LUMBAR ROM: NT Thoracic ROM wfl  all directions  Cervical spine wfl all directions Decreased movement noted with isolation of L 2st rib, seated cervical flex/SB   B shoulders wfl but R sholder with poor mechanics for final 15 degrees of movement MMT: R shoulder flex, abd 4/5 Otherwise symmetrical , wfl   GAIT: Distance walked: 50' in clnic, wnl  TODAY'S TREATMENT:                                                                                                                              DATE:   12/01/22 Therapeutic Exercise: to improve strength and mobility.  Demo, verbal and tactile cues throughout for technique. Nustep L3 x 5 min Shoulder circles at wall MMT Manual Therapy: to decrease muscle spasm and pain and improve mobility STM/TPR to L UT, L/S, rhomboids, cervical paraspinals, UPA mobs cervical spine, PA mobs thoracic spine grade 1-2, skilled palpation and monitoring  during dry needling. Trigger Point Dry-Needling  Treatment instructions: Expect mild to moderate muscle soreness. S/S of pneumothorax if dry needled over a lung field, and to seek immediate medical attention should they occur. Patient verbalized understanding of these instructions and education. Patient Consent Given: Yes Education handout provided: Previously provided Muscles treated: L UT, L/S, L C5-6 multifidi Electrical stimulation performed: No Parameters: N/A Treatment response/outcome: Twitch Response Elicited and Palpable Increase in Muscle Length    11/24/22  Therapeutic Exercise: to improve strength and mobility.  Demo, verbal and tactile cues throughout for technique. Nustep L5 x 4 min  Manual Therapy: to decrease muscle spasm and pain and improve mobility STM/TPR to bil UT, L/S, rhomboids, cervical paraspinals, UPA mobs cervical spine, skilled palpation and monitoring during dry needling. Kinesiotaping - Y tape applied to L levator scapule, 25% stretch, 0 stretch on anchors.  Trigger Point Dry-Needling  Treatment instructions: Expect mild to moderate muscle soreness. S/S of pneumothorax if dry needled over a lung field, and to seek immediate medical attention should they occur. Patient verbalized understanding of these instructions and education. Patient Consent Given: Yes Education handout provided: Previously provided Muscles treated: bil UT, L/S, bil C5-6 multifidi Electrical stimulation performed: No  Parameters: N/A Treatment response/outcome: Twitch Response Elicited and Palpable Increase in Muscle Length   11/17/22 Therapeutic Exercise: to improve strength and mobility.  Demo, verbal and tactile cues throughout for technique. Nustep L4 x 4 min  Manual Therapy: to decrease muscle spasm and pain and improve mobility STM/TPR to bil UT, L/S, rhomboids, cervical paraspinals, UPA mobs cervical spine, skilled palpation and monitoring during dry needling. Trigger Point  Dry-Needling  Treatment instructions: Expect mild to moderate muscle soreness. S/S of pneumothorax if dry needled over a lung field, and to seek immediate medical attention should they occur. Patient verbalized understanding of these instructions and education. Patient Consent Given: Yes Education handout provided: Previously provided Muscles treated: bil UT, L/S, bil C5-6 multifidi Electrical stimulation performed: No Parameters: N/A Treatment response/outcome: Twitch Response Elicited and Palpable Increase in Muscle Length  PATIENT EDUCATION:  Education details: HEP update Person educated: Patient Education method: Explanation, Demonstration, Verbal cues, and Handouts Education comprehension: verbalized understanding and returned demonstration  HOME EXERCISE PROGRAM: Access Code: [email or web address] URL: [email or web address] Date: 11/03/2022 Prepared by: Harrie Foreman  Exercises - Seated Quadratus Lumborum Stretch in Chair  - 1 x daily - 7 x weekly - 1 sets - 3 reps - Seated Trunk Rotation Stretch  - 1 x daily - 7 x weekly - 1 sets - 10 reps - Seated Shoulder Horizontal Abduction - Thumbs Up  - 1 x daily - 7 x weekly - 1 sets - 10 reps - Standing Thoracic Open Book at Wall  - 1 x daily - 7 x weekly - 1 sets - 10 reps - Standing shoulder flexion wall slides  - 1 x daily - 7 x weekly - 1 sets - 10 reps - Seated Shoulder Rolls  - 1 x daily - 7 x weekly - 1 sets - 10 reps  ASSESSMENT:  CLINICAL IMPRESSION: Jillian Hayes reports will have several pain free days but then return of Levator trigger point causing pain and interupted sleep.  Again focused on manual therapy and TrDN today to reduce muscle spasm and pain.  Discussed the kidneys can refer pain to scapular region, she will discuss with dialysis doctor.  She does demonstrate improved strength in B shoulders, meeting LTG #2.  Since she continues to have acute pain interfering with QOL, including sleep and dialysis and has  gotten the best relief with PT so far, recommending continued skilled therapy 1x/week for additional 8 weeks for pain management.   Jillian Hayes continues to demonstrate potential for improvement and would benefit from continued skilled therapy to address impairments.     OBJECTIVE IMPAIRMENTS: decreased activity tolerance, decreased strength, increased fascial restrictions, impaired perceived functional ability, impaired UE functional use, and pain.   ACTIVITY LIMITATIONS: carrying, lifting, standing, sleeping, and reach over head  PARTICIPATION LIMITATIONS: meal prep  PERSONAL FACTORS: Age, Behavior pattern, Past/current experiences, Time since onset of injury/illness/exacerbation, and 3+ comorbidities: ESRD, osteoporosis,   are also affecting patient's functional outcome.   REHAB POTENTIAL: Fair    CLINICAL DECISION MAKING: Evolving/moderate complexity  EVALUATION COMPLEXITY: Moderate   GOALS: Goals reviewed with patient? Yes  SHORT TERM GOALS: Target date: 11/10/22  I HEP Baseline: Goal status: MET 11/03/22- given 11/17/22 compliant   LONG TERM GOALS: Target date: 12/03/22 extended to 01/26/23  Modified Oswestry improve to 15/50 Baseline: 25/50 Goal status: IN PROGRESS  2.  Strength B shoulder horizontal abd(middle traps, and lower traps 5/5) Baseline: TBD Goal status: MET 12/01/22  3.  Sleeping improve to greater  than 2 hrs without pain L scapulothoracic region Baseline:  Goal status: IN PROGRESS 11/17/22- was sleeping better but worse last 2 days.  12/01/22- varies, some days sleeps well, other days has great difficulty sleeping.   PLAN:  PT FREQUENCY: 1x/week  PT DURATION: 8 weeks  PLANNED INTERVENTIONS: Therapeutic exercises, Therapeutic activity, Neuromuscular re-education, Balance training, Gait training, Patient/Family education, Self Care, and Joint mobilization.  PLAN FOR NEXT SESSION: continue to progress scapular strengthening, mobility, manual  therapy, TrDN  Jena Gauss, PT, DPT  12/01/2022, 4:35 PM

## 2022-12-03 DIAGNOSIS — Z992 Dependence on renal dialysis: Secondary | ICD-10-CM | POA: Diagnosis not present

## 2022-12-03 DIAGNOSIS — N186 End stage renal disease: Secondary | ICD-10-CM | POA: Diagnosis not present

## 2022-12-09 NOTE — Assessment & Plan Note (Signed)
Patient has dialysis several times a week. Will request labs from nephrology to monitor

## 2022-12-09 NOTE — Progress Notes (Unsigned)
[facility] [address] [place] [address] Phone: [phone]   Assessment and Plan:     There are no diagnoses linked to this encounter.  ***   Pertinent previous records reviewed include ***   Follow Up: ***     Subjective:   I, Jillian Hayes, am serving as a Neurosurgeon for Doctor Richardean Sale  Chief Complaint: left shoulder pain    HPI:    09/24/2022 Patient is a 77 year old female complaining of left shoulder pain. Patient states she sees Dr. Shon Baton and she has tried different types of injections to help with her shoulder blade on right side. The injection they either didn't help or didn't last at all. Patient was referred here to see what the next steps are. Patient states the pain started a few months ago when driving to [place] and the pain was only when she would be doing daily activities but would go away with rest. The pain now is constant.    10/22/2022  Patient states continued left shoulder pain. Has been to PT and had kinesiology taping, that was somewhat helpful. Has also tried dry-needling, got short-term relief. Denies new or worsening sx since last visit, continued aching. OMT was helpful short-term for sx.    11/12/2022 Patient states that she is better   12/10/2022 Patient states   Relevant Historical Information: ESRD on dialysis, atrial fibrillation, history of DVT, on chronic anticoagulation with Eliquis  Additional pertinent review of systems negative.   Current Outpatient Medications:    acetaminophen (TYLENOL) 500 MG tablet, Take 500 mg by mouth every 6 (six) hours as needed for headache (pain)., Disp: , Rfl:    Alfalfa 500 MG TABS, Take 500 mg by mouth 3 (three) times daily., Disp: , Rfl:    apixaban (ELIQUIS) 5 MG TABS tablet, Take 1 tablet (5 mg total) by mouth 2 (two) times daily., Disp: 60 tablet, Rfl: 6   atorvastatin (LIPITOR) 20 MG tablet, Take 20 mg by mouth at bedtime., Disp: ,  Rfl:    carvedilol (COREG) 25 MG tablet, Take 1 tablet (25 mg total) by mouth 2 (two) times daily., Disp: 180 tablet, Rfl: 3   cinacalcet (SENSIPAR) 30 MG tablet, Take 30 mg by mouth at bedtime., Disp: , Rfl:    diltiazem (CARDIZEM CD) 240 MG 24 hr capsule, Take 240 mg by mouth daily with lunch., Disp: , Rfl:    hydrALAZINE (APRESOLINE) 50 MG tablet, Take 75 mg by mouth 2 (two) times daily., Disp: , Rfl:    L-Glutamine 500 MG CAPS, Take 500 mg by mouth every evening., Disp: , Rfl:    lidocaine-prilocaine (EMLA) cream, Apply 1 application  topically every Monday, Wednesday, and Friday with hemodialysis., Disp: , Rfl:    OVER THE COUNTER MEDICATION, Take 3 capsules by mouth every evening. Shaklee Herb-Lax Natural Laxative for Colon Cleanse (Senna, Licorice, and Alfalfa), Disp: , Rfl:    sevelamer carbonate (RENVELA) 800 MG tablet, Take 2,400 mg by mouth See admin instructions. Take 3 tablets (2400 mg) by mouth with each meal & take 2 tablets (1600 mg) by mouth with each large snack, Disp: , Rfl:    vitamin C (ASCORBIC ACID) 500 MG tablet, Take 500 mg by mouth daily., Disp: , Rfl:    Objective:     There were no vitals filed for this visit.    There is no height or weight on file to calculate BMI.    Physical  Exam:    ***   Electronically signed by:  Jillian [facility] 7:29 AM 12/09/22

## 2022-12-09 NOTE — Assessment & Plan Note (Signed)
[id number] acceptable, minimize simple carbs. Increase exercise as tolerated.  

## 2022-12-09 NOTE — Assessment & Plan Note (Signed)
Encourage heart healthy diet such as MIND or DASH diet, increase exercise, avoid trans fats, simple carbohydrates and processed foods, consider a krill or fish or flaxseed oil cap daily.  °

## 2022-12-09 NOTE — Assessment & Plan Note (Signed)
Encouraged to get adequate exercise, calcium and vitamin d intake 

## 2022-12-09 NOTE — Assessment & Plan Note (Signed)
Hydrate and monitor 

## 2022-12-09 NOTE — Assessment & Plan Note (Signed)
Tolerating Eliquis, rate controlled 

## 2022-12-09 NOTE — Assessment & Plan Note (Signed)
Well controlled, no changes to meds. Encouraged heart healthy diet such as the DASH diet and exercise as tolerated.  °

## 2022-12-10 ENCOUNTER — Ambulatory Visit (INDEPENDENT_AMBULATORY_CARE_PROVIDER_SITE_OTHER): Payer: Medicare HMO | Admitting: Family Medicine

## 2022-12-10 ENCOUNTER — Encounter: Payer: Self-pay | Admitting: Family Medicine

## 2022-12-10 ENCOUNTER — Ambulatory Visit: Payer: Medicare HMO | Admitting: Sports Medicine

## 2022-12-10 VITALS — BP 156/62 | HR 69 | Temp 98.3°F | Resp 16 | Ht 66.0 in | Wt 132.6 lb

## 2022-12-10 VITALS — BP 120/84 | HR 63 | Ht 66.0 in | Wt 133.0 lb

## 2022-12-10 DIAGNOSIS — E538 Deficiency of other specified B group vitamins: Secondary | ICD-10-CM | POA: Diagnosis not present

## 2022-12-10 DIAGNOSIS — M810 Age-related osteoporosis without current pathological fracture: Secondary | ICD-10-CM | POA: Diagnosis not present

## 2022-12-10 DIAGNOSIS — E782 Mixed hyperlipidemia: Secondary | ICD-10-CM | POA: Diagnosis not present

## 2022-12-10 DIAGNOSIS — M542 Cervicalgia: Secondary | ICD-10-CM | POA: Diagnosis not present

## 2022-12-10 DIAGNOSIS — M7552 Bursitis of left shoulder: Secondary | ICD-10-CM | POA: Diagnosis not present

## 2022-12-10 DIAGNOSIS — M4124 Other idiopathic scoliosis, thoracic region: Secondary | ICD-10-CM | POA: Diagnosis not present

## 2022-12-10 DIAGNOSIS — M9901 Segmental and somatic dysfunction of cervical region: Secondary | ICD-10-CM

## 2022-12-10 DIAGNOSIS — D631 Anemia in chronic kidney disease: Secondary | ICD-10-CM | POA: Diagnosis not present

## 2022-12-10 DIAGNOSIS — G2589 Other specified extrapyramidal and movement disorders: Secondary | ICD-10-CM

## 2022-12-10 DIAGNOSIS — M9908 Segmental and somatic dysfunction of rib cage: Secondary | ICD-10-CM

## 2022-12-10 DIAGNOSIS — I480 Paroxysmal atrial fibrillation: Secondary | ICD-10-CM | POA: Diagnosis not present

## 2022-12-10 DIAGNOSIS — L578 Other skin changes due to chronic exposure to nonionizing radiation: Secondary | ICD-10-CM

## 2022-12-10 DIAGNOSIS — N186 End stage renal disease: Secondary | ICD-10-CM

## 2022-12-10 DIAGNOSIS — R739 Hyperglycemia, unspecified: Secondary | ICD-10-CM | POA: Diagnosis not present

## 2022-12-10 DIAGNOSIS — I10 Essential (primary) hypertension: Secondary | ICD-10-CM

## 2022-12-10 DIAGNOSIS — M9902 Segmental and somatic dysfunction of thoracic region: Secondary | ICD-10-CM | POA: Diagnosis not present

## 2022-12-10 NOTE — Assessment & Plan Note (Signed)
Left neck has a mass possible tumor in salivary gland tried to get it biopsied by Dr Christell Constant ENT but did not get any cells so they are doing another biopsy with a bigger needle in 2 weeks to r/o cancer. She continues to struggle with numbness over left ear also. Not worsening

## 2022-12-10 NOTE — Assessment & Plan Note (Signed)
Sees Derm annually notes increasing numbers of white and scaly spots c/w SK will follow up with derm

## 2022-12-10 NOTE — Progress Notes (Signed)
Subjective:    Patient ID: Jillian Hayes, female    DOB: 1945/06/08, 77 y.o.   MRN: [id number]  Chief Complaint  Patient presents with  . Follow-up    HPI Discussed the use of AI scribe software for clinical note transcription with the patient, who gave verbal consent to proceed.  History of Present Illness   The patient, with a history of sleep disturbances, reports improved sleep due to the use of LifeWave patches. The patient also has a knot behind the ear, which was previously biopsied but did not yield enough cells for analysis. The patient also reports numbness in the ear, the cause of which is currently unknown. The patient has noticed white spots on the skin, identified by the healthcare provider as seborrheic keratosis. The patient is due for a tetanus shot but is hesitant due to discomfort in one arm.        Past Medical History:  Diagnosis Date  . Advanced care planning/counseling discussion 06/10/2014   05/31/2014 patient presents copy of HCP and Living Will  . Anemia    iron deficiency  . Anxiety   . Arthritis    "in hands"  . Atrial fibrillation (HCC)   . Benign fundic gland polyps of stomach   . Bladder polyps 06/25/2010  . Chronic headaches 06/24/2010  . Complication of anesthesia    "woke up at the end of a cyst removal surgery in 1991"  . Depression 1991   hospitalized  . Diverticulosis   . DVT of right axillary vein, acute (HCC) 08/07/2022  . ESRD (end stage renal disease) (HCC) 11/09/2015   HD on MWF  . External prolapsed hemorrhoids   . History of chicken pox 06/25/2010  . Hypercalcemia 02/18/2014  . Hypertension   . Insomnia 06/24/2010  . Multiple chemical sensitivity syndrome 06/25/2010  . Proteinuria 02/18/2014  . Renal insufficiency 03/26/2011  . Valvular heart disease 04/28/2016    Past Surgical History:  Procedure Laterality Date  . AV FISTULA PLACEMENT Left    x4  . COLONOSCOPY  2014  . cyst on left breast removed Left 1991    benign  . ESOPHAGOGASTRODUODENOSCOPY (EGD) WITH ESOPHAGEAL DILATION  2014  . LAPAROSCOPIC BILATERAL SALPINGO OOPHERECTOMY Bilateral 08/20/2020   Procedure: LAPAROSCOPIC BILATERAL SALPINGO OOPHORECTOMY;  Surgeon: Jerene Bears, MD;  Location: [facility] OR;  Service: Gynecology;  Laterality: Bilateral;  . NASAL SEPTUM SURGERY  1986   rhinoplasty  . polyps on bladder removed  1972   benign  . RIGHT HEART CATH N/A 04/10/2021   Procedure: RIGHT HEART CATH;  Surgeon: Laurey Morale, MD;  Location: [facility] INVASIVE CV LAB;  Service: Cardiovascular;  Laterality: N/A;  . TONSILLECTOMY  1962    Family History  Problem Relation Age of Onset  . Breast cancer Mother 58       left breast removed, 2010 lung  . Diabetes Mother   . Nephrolithiasis Father   . Heart attack Father 40       X 3  . Heart disease Father        smoker  . Hypertension Brother   . Allergic rhinitis Brother   . Melanoma Son 37       melanoma on leg removed  . Pancreatic cancer Maternal Grandmother   . Hypertension Paternal Grandmother   . Obesity Paternal Grandmother   . Heart attack Paternal Grandfather 40  . Diabetes Maternal Aunt        x 2  . Diabetes Maternal Uncle  x 3  . Asthma Neg Hx   . Eczema Neg Hx   . Urticaria Neg Hx   . Immunodeficiency Neg Hx   . Angioedema Neg Hx     Social History   Socioeconomic History  . Marital status: Married    Spouse name: Not on file  . Number of children: 2  . Years of education: Not on file  . Highest education level: Not on file  Occupational History  . Occupation: retired  Tobacco Use  . Smoking status: Never  . Smokeless tobacco: Never  Vaping Use  . Vaping status: Never Used  Substance and Sexual Activity  . Alcohol use: No  . Drug use: No  . Sexual activity: Yes    Partners: Male  Other Topics Concern  . Not on file  Social History Narrative   Married and lives with husband.     Retired   1 son and 1 daughter   No alcohol tobacco or caffeine    Social Determinants of Health   Financial Resource Strain: Low Risk  (01/30/2021)   Overall Financial Resource Strain (CARDIA)   . Difficulty of Paying Living Expenses: Not hard at all  Food Insecurity: No Food Insecurity (07/07/2022)   Hunger Vital Sign   . Worried About Programme researcher, broadcasting/film/video in the Last Year: Never true   . Ran Out of Food in the Last Year: Never true  Transportation Needs: No Transportation Needs (07/07/2022)   PRAPARE - Transportation   . Lack of Transportation (Medical): No   . Lack of Transportation (Non-Medical): No  Physical Activity: Insufficiently Active (01/30/2021)   Exercise Vital Sign   . Days of Exercise per Week: 7 days   . Minutes of Exercise per Session: 20 min  Stress: No Stress Concern Present (01/30/2021)   [employer] of Occupational Health - Occupational Stress Questionnaire   . Feeling of Stress : Not at all  Social Connections: Moderately Integrated (01/30/2021)   Social Connection and Isolation Panel [NHANES]   . Frequency of Communication with Friends and Family: More than three times a week   . Frequency of Social Gatherings with Friends and Family: More than three times a week   . Attends Religious Services: More than 4 times per year   . Active Member of Clubs or Organizations: No   . Attends Banker Meetings: Never   . Marital Status: Married  Catering manager Violence: Not At Risk (05/05/2022)   Humiliation, Afraid, Rape, and Kick questionnaire   . Fear of Current or Ex-Partner: No   . Emotionally Abused: No   . Physically Abused: No   . Sexually Abused: No    Outpatient Medications Prior to Visit  Medication Sig Dispense Refill  . acetaminophen (TYLENOL) 500 MG tablet Take 500 mg by mouth every 6 (six) hours as needed for headache (pain).    . Alfalfa 500 MG TABS Take 500 mg by mouth 3 (three) times daily.    Marland Kitchen apixaban (ELIQUIS) 5 MG TABS tablet Take 1 tablet (5 mg total) by mouth 2 (two) times daily. 60 tablet  6  . atorvastatin (LIPITOR) 20 MG tablet Take 20 mg by mouth at bedtime.    . carvedilol (COREG) 25 MG tablet Take 1 tablet (25 mg total) by mouth 2 (two) times daily. 180 tablet 3  . cinacalcet (SENSIPAR) 30 MG tablet Take 30 mg by mouth at bedtime.    Marland Kitchen diltiazem (CARDIZEM CD) 240 MG 24 hr capsule Take  240 mg by mouth daily with lunch.    . hydrALAZINE (APRESOLINE) 50 MG tablet Take 75 mg by mouth 2 (two) times daily.    . L-Glutamine 500 MG CAPS Take 500 mg by mouth every evening.    . lidocaine-prilocaine (EMLA) cream Apply 1 application  topically every Monday, Wednesday, and Friday with hemodialysis.    Marland Kitchen OVER THE COUNTER MEDICATION Take 3 capsules by mouth every evening. Shaklee Herb-Lax Natural Laxative for Colon Cleanse (Senna, Licorice, and Alfalfa)    . sevelamer carbonate (RENVELA) 800 MG tablet Take 2,400 mg by mouth See admin instructions. Take 3 tablets (2400 mg) by mouth with each meal & take 2 tablets (1600 mg) by mouth with each large snack    . vitamin C (ASCORBIC ACID) 500 MG tablet Take 500 mg by mouth daily.     No facility-administered medications prior to visit.    Allergies  Allergen Reactions  . Clonidine Derivatives Palpitations  . Sulfa Antibiotics Nausea And Vomiting    Review of Systems  Constitutional:  Negative for fever and malaise/fatigue.  HENT:  Negative for congestion.   Eyes:  Negative for blurred vision.  Respiratory:  Negative for shortness of breath.   Cardiovascular:  Negative for chest pain, palpitations and leg swelling.  Gastrointestinal:  Negative for abdominal pain, blood in stool and nausea.  Genitourinary:  Negative for dysuria and frequency.  Musculoskeletal:  Negative for falls.  Skin:  Negative for rash.  Neurological:  Negative for dizziness, loss of consciousness and headaches.  Endo/Heme/Allergies:  Negative for environmental allergies.  Psychiatric/Behavioral:  Negative for depression. The patient is not nervous/anxious.         Objective:    Physical Exam Constitutional:      General: She is not in acute distress.    Appearance: Normal appearance. She is well-developed. She is not toxic-appearing.  HENT:     Head: Normocephalic and atraumatic.     Right Ear: External ear normal.     Left Ear: External ear normal.     Nose: Nose normal.  Eyes:     General:        Right eye: No discharge.        Left eye: No discharge.     Conjunctiva/sclera: Conjunctivae normal.  Neck:     Thyroid: No thyromegaly.  Cardiovascular:     Rate and Rhythm: Normal rate and regular rhythm.     Heart sounds: Normal heart sounds. No murmur heard. Pulmonary:     Effort: Pulmonary effort is normal. No respiratory distress.     Breath sounds: Normal breath sounds.  Abdominal:     General: Bowel sounds are normal.     Palpations: Abdomen is soft.     Tenderness: There is no abdominal tenderness. There is no guarding.  Musculoskeletal:        General: Normal range of motion.     Cervical back: Neck supple.  Lymphadenopathy:     Cervical: No cervical adenopathy.  Skin:    General: Skin is warm and dry.  Neurological:     Mental Status: She is alert and oriented to person, place, and time.  Psychiatric:        Mood and Affect: Mood normal.        Behavior: Behavior normal.        Thought Content: Thought content normal.        Judgment: Judgment normal.   BP (!) 156/62 (BP Location: Right Arm, Patient Position: Sitting,  Cuff Size: Normal)   Pulse 69   Temp 98.3 F (36.8 C) (Oral)   Resp 16   Ht 5\' 6"  (1.676 m)   Wt 132 lb 9.6 oz (60.1 kg)   SpO2 94%   BMI 21.40 kg/m  Wt Readings from Last 3 Encounters:  12/10/22 132 lb 9.6 oz (60.1 kg)  12/10/22 133 lb (60.3 kg)  11/19/22 131 lb 1.9 oz (59.5 kg)    Diabetic Foot Exam - Simple   No data filed    Lab Results  Component Value Date   WBC 5.3 11/19/2022   HGB 10.9 (L) 11/19/2022   HCT 35.3 (L) 11/19/2022   PLT 211 11/19/2022   GLUCOSE 97 11/19/2022   CHOL  157 12/04/2021   TRIG 59.0 12/04/2021   HDL 100.70 12/04/2021   LDLCALC 45 12/04/2021   ALT 11 11/19/2022   AST 20 11/19/2022   NA 140 11/19/2022   K 5.0 11/19/2022   CL 100 11/19/2022   CREATININE 5.80 (H) 11/19/2022   BUN 37 (H) 11/19/2022   CO2 29 11/19/2022   TSH 0.13 (L) 07/28/2022   INR 1.2 08/07/2022   HGBA1C 4.7 07/28/2022    Lab Results  Component Value Date   TSH 0.13 (L) 07/28/2022   Lab Results  Component Value Date   WBC 5.3 11/19/2022   HGB 10.9 (L) 11/19/2022   HCT 35.3 (L) 11/19/2022   MCV 102.6 (H) 11/19/2022   PLT 211 11/19/2022   Lab Results  Component Value Date   NA 140 11/19/2022   K 5.0 11/19/2022   CHLORIDE 109 03/25/2016   CO2 29 11/19/2022   GLUCOSE 97 11/19/2022   BUN 37 (H) 11/19/2022   CREATININE 5.80 (H) 11/19/2022   BILITOT 0.4 11/19/2022   ALKPHOS 76 11/19/2022   AST 20 11/19/2022   ALT 11 11/19/2022   PROT 6.0 (L) 11/19/2022   ALBUMIN 3.9 11/19/2022   CALCIUM 9.5 11/19/2022   ANIONGAP 11 11/19/2022   EGFR 8 (L) 04/01/2021   GFR 7.35 (LL) 12/04/2021   Lab Results  Component Value Date   CHOL 157 12/04/2021   Lab Results  Component Value Date   HDL 100.70 12/04/2021   Lab Results  Component Value Date   LDLCALC 45 12/04/2021   Lab Results  Component Value Date   TRIG 59.0 12/04/2021   Lab Results  Component Value Date   CHOLHDL 2 12/04/2021   Lab Results  Component Value Date   HGBA1C 4.7 07/28/2022       Assessment & Plan:  Anemia in ESRD (end-stage renal disease) (HCC) Assessment & Plan: Patient has dialysis several times a week. Will request labs from nephrology to monitor   Paroxysmal atrial fibrillation [facility]) Assessment & Plan: Tolerating Eliquis, rate controlled  Orders: -     Vitamin B12  Disorder of vitamin B12 Assessment & Plan: Hydrate and monitor    Hypertension, unspecified type Assessment & Plan: Well controlled, no changes to meds. Encouraged heart healthy diet such as the DASH  diet and exercise as tolerated.   Orders: -     TSH -     T4, free  Hyperglycemia Assessment & Plan: hgba1c acceptable, minimize simple carbs. Increase exercise as tolerated.   Orders: -     Hemoglobin A1c  Mixed hyperlipidemia Assessment & Plan: Encourage heart healthy diet such as MIND or DASH diet, increase exercise, avoid trans fats, simple carbohydrates and processed foods, consider a krill or fish or flaxseed oil cap daily.  Orders: -     Lipid panel  Osteoporosis, unspecified osteoporosis type, unspecified pathological fracture presence Assessment & Plan: Encouraged to get adequate exercise, calcium and vitamin d intake    Neck pain Assessment & Plan: Left neck has a mass possible tumor in salivary gland tried to get it biopsied by Dr Christell Constant ENT but did not get any cells so they are doing another biopsy with a bigger needle in 2 weeks to r/o cancer. She continues to struggle with numbness over left ear also. Not worsening   Sun-damaged skin Assessment & Plan: Sees Derm annually notes increasing numbers of white and scaly spots c/w SK will follow up with derm     Assessment and Plan    Salivary Gland Tumor Patient reports a knot behind the ear, evaluated by ENT with needle biopsy. Insufficient cells obtained for diagnosis. Plan for repeat biopsy with larger needle. No associated symptoms or concerns from ENT specialist. -Repeat biopsy with ENT specialist.  Numbness in Ear Patient reports persistent numbness in the ear. No clear etiology identified. Possible post-herpetic neuralgia considered, but shingles outbreak was on the opposite side. -Continue to monitor symptoms.  Seborrheic Keratosis Patient reports new skin lesions, likely seborrheic keratosis based on description. No signs of rapid growth or color change. -Point out new lesions to dermatologist at next annual visit.  Thyroid Function Previous labs showed low TSH with normal T4. No current thyroid  medication. -Check TSH, T4, and other labs today.  Tetanus Vaccination Last tetanus shot was in 2014. Patient currently hesitant due to recent arm discomfort. -Defer tetanus shot until arm discomfort resolves. If patient sustains injury, administer tetanus shot promptly.  Chronic Care Management Patient reports some anxiety and stress, caregiver strain, and potential malnutrition. -Refer to RN case management and clinical social worker for assessment and support.         Danise Edge, MD

## 2022-12-10 NOTE — Patient Instructions (Addendum)
Tetanus at pharmacy  Flu and covid boosters  Insomnia Insomnia is a sleep disorder that makes it difficult to fall asleep or stay asleep. Insomnia can cause fatigue, low energy, difficulty concentrating, mood swings, and poor performance at work or school. There are three different ways to classify insomnia: Difficulty falling asleep. Difficulty staying asleep. Waking up too early in the morning. Any type of insomnia can be long-term (chronic) or short-term (acute). Both are common. Short-term insomnia usually lasts for 3 months or less. Chronic insomnia occurs at least three times a week for longer than 3 months. What are the causes? Insomnia may be caused by another condition, situation, or substance, such as: Having certain mental health conditions, such as anxiety and depression. Using caffeine, alcohol, tobacco, or drugs. Having gastrointestinal conditions, such as gastroesophageal reflux disease (GERD). Having certain medical conditions. These include: Asthma. Alzheimer's disease. Stroke. Chronic pain. An overactive thyroid gland (hyperthyroidism). Other sleep disorders, such as restless legs syndrome and sleep apnea. Menopause. Sometimes, the cause of insomnia may not be known. What increases the risk? Risk factors for insomnia include: Gender. Females are affected more often than males. Age. Insomnia is more common as people get older. Stress and certain medical and mental health conditions. Lack of exercise. Having an irregular work schedule. This may include working night shifts and traveling between different time zones. What are the signs or symptoms? If you have insomnia, the main symptom is having trouble falling asleep or having trouble staying asleep. This may lead to other symptoms, such as: Feeling tired or having low energy. Feeling nervous about going to sleep. Not feeling rested in the morning. Having trouble concentrating. Feeling irritable, anxious, or  depressed. How is this diagnosed? This condition may be diagnosed based on: Your symptoms and medical history. Your health care provider may ask about: Your sleep habits. Any medical conditions you have. Your mental health. A physical exam. How is this treated? Treatment for insomnia depends on the cause. Treatment may focus on treating an underlying condition that is causing the insomnia. Treatment may also include: Medicines to help you sleep. Counseling or therapy. Lifestyle adjustments to help you sleep better. Follow these instructions at home: Eating and drinking  Limit or avoid alcohol, caffeinated beverages, and products that contain nicotine and tobacco, especially close to bedtime. These can disrupt your sleep. Do not eat a large meal or eat spicy foods right before bedtime. This can lead to digestive discomfort that can make it hard for you to sleep. Sleep habits  Keep a sleep diary to help you and your health care provider figure out what could be causing your insomnia. Write down: When you sleep. When you wake up during the night. How well you sleep and how rested you feel the next day. Any side effects of medicines you are taking. What you eat and drink. Make your bedroom a dark, comfortable place where it is easy to fall asleep. Put up shades or blackout curtains to block light from outside. Use a white noise machine to block noise. Keep the temperature cool. Limit screen use before bedtime. This includes: Not watching TV. Not using your smartphone, tablet, or computer. Stick to a routine that includes going to bed and waking up at the same times every day and night. This can help you fall asleep faster. Consider making a quiet activity, such as reading, part of your nighttime routine. Try to avoid taking naps during the day so that you sleep better at night. Get  out of bed if you are still awake after 15 minutes of trying to sleep. Keep the lights down, but try  reading or doing a quiet activity. When you feel sleepy, go back to bed. General instructions Take over-the-counter and prescription medicines only as told by your health care provider. Exercise regularly as told by your health care provider. However, avoid exercising in the hours right before bedtime. Use relaxation techniques to manage stress. Ask your health care provider to suggest some techniques that may work well for you. These may include: Breathing exercises. Routines to release muscle tension. Visualizing peaceful scenes. Make sure that you drive carefully. Do not drive if you feel very sleepy. Keep all follow-up visits. This is important. Contact a health care provider if: You are tired throughout the day. You have trouble in your daily routine due to sleepiness. You continue to have sleep problems, or your sleep problems get worse. Get help right away if: You have thoughts about hurting yourself or someone else. Get help right away if you feel like you may hurt yourself or others, or have thoughts about taking your own life. Go to your nearest emergency room or: Call 911. Call the National Suicide Prevention Lifeline at 364-533-0240 or 988. This is open 24 hours a day. Text the Crisis Text Line at (226) 779-8519. Summary Insomnia is a sleep disorder that makes it difficult to fall asleep or stay asleep. Insomnia can be long-term (chronic) or short-term (acute). Treatment for insomnia depends on the cause. Treatment may focus on treating an underlying condition that is causing the insomnia. Keep a sleep diary to help you and your health care provider figure out what could be causing your insomnia. This information is not intended to replace advice given to you by your health care provider. Make sure you discuss any questions you have with your health care provider. Document Revised: 12/30/2020 Document Reviewed: 12/30/2020 Elsevier Patient Education  2024 [employer].

## 2022-12-11 LAB — T4, FREE: Free T4: 1 ng/dL (ref 0.60–1.60)

## 2022-12-11 LAB — LIPID PANEL
Cholesterol: 132 mg/dL (ref 0–200)
HDL: 89.9 mg/dL
LDL Cholesterol: 27 mg/dL (ref 0–99)
NonHDL: 42.43
Total CHOL/HDL Ratio: 1
Triglycerides: 79 mg/dL (ref 0.0–149.0)
VLDL: 15.8 mg/dL (ref 0.0–40.0)

## 2022-12-11 LAB — TSH: TSH: 1.47 u[IU]/mL (ref 0.35–5.50)

## 2022-12-11 LAB — VITAMIN B12: Vitamin B-12: 757 pg/mL (ref 211–911)

## 2022-12-11 LAB — HEMOGLOBIN A1C: Hgb A1c MFr Bld: 4.8 % (ref 4.6–6.5)

## 2022-12-14 ENCOUNTER — Other Ambulatory Visit: Payer: Self-pay | Admitting: Family Medicine

## 2022-12-14 DIAGNOSIS — E042 Nontoxic multinodular goiter: Secondary | ICD-10-CM

## 2022-12-14 DIAGNOSIS — D490 Neoplasm of unspecified behavior of digestive system: Secondary | ICD-10-CM

## 2022-12-15 ENCOUNTER — Ambulatory Visit: Payer: Medicare HMO | Attending: Sports Medicine | Admitting: Physical Therapy

## 2022-12-15 ENCOUNTER — Encounter: Payer: Self-pay | Admitting: Physical Therapy

## 2022-12-15 DIAGNOSIS — M6281 Muscle weakness (generalized): Secondary | ICD-10-CM

## 2022-12-15 DIAGNOSIS — M546 Pain in thoracic spine: Secondary | ICD-10-CM | POA: Diagnosis not present

## 2022-12-15 DIAGNOSIS — M62830 Muscle spasm of back: Secondary | ICD-10-CM | POA: Diagnosis not present

## 2022-12-15 DIAGNOSIS — R293 Abnormal posture: Secondary | ICD-10-CM | POA: Diagnosis not present

## 2022-12-15 NOTE — Therapy (Signed)
OUTPATIENT PHYSICAL THERAPY TREATMENT  Patient Name: Jillian Hayes MRN: [id number] DOB:December 03, 1945, 77 y.o., female Today's Date: 12/15/2022  END OF SESSION:  PT End of Session - 12/15/22 1713     Visit Number 8    Date for PT Re-Evaluation 01/26/23    Progress Note Due on Visit 15    PT Start Time 1617    PT Stop Time 1700    PT Time Calculation (min) 43 min    Activity Tolerance Patient limited by pain;Patient tolerated treatment well    Behavior During Therapy [facility] for tasks assessed/performed             Past Medical History:  Diagnosis Date   Advanced care planning/counseling discussion 06/10/2014   05/31/2014 patient presents copy of HCP and Living Will   Anemia    iron deficiency   Anxiety    Arthritis    "in hands"   Atrial fibrillation (HCC)    Benign fundic gland polyps of stomach    Bladder polyps 06/25/2010   Chronic headaches 06/24/2010   Complication of anesthesia    "woke up at the end of a cyst removal surgery in 1991"   Depression 1991   hospitalized   Diverticulosis    DVT of right axillary vein, acute (HCC) 08/07/2022   ESRD (end stage renal disease) (HCC) 11/09/2015   HD on MWF   External prolapsed hemorrhoids    History of chicken pox 06/25/2010   Hypercalcemia 02/18/2014   Hypertension    Insomnia 06/24/2010   Multiple chemical sensitivity syndrome 06/25/2010   Proteinuria 02/18/2014   Renal insufficiency 03/26/2011   Valvular heart disease 04/28/2016   Past Surgical History:  Procedure Laterality Date   AV FISTULA PLACEMENT Left    x4   COLONOSCOPY  2014   cyst on left breast removed Left 1991   benign   ESOPHAGOGASTRODUODENOSCOPY (EGD) WITH ESOPHAGEAL DILATION  2014   LAPAROSCOPIC BILATERAL SALPINGO OOPHERECTOMY Bilateral 08/20/2020   Procedure: LAPAROSCOPIC BILATERAL SALPINGO OOPHORECTOMY;  Surgeon: Jerene Bears, MD;  Location: [facility] OR;  Service: Gynecology;  Laterality: Bilateral;   NASAL SEPTUM SURGERY  1986   rhinoplasty    polyps on bladder removed  1972   benign   RIGHT HEART CATH N/A 04/10/2021   Procedure: RIGHT HEART CATH;  Surgeon: Laurey Morale, MD;  Location: [facility] INVASIVE CV LAB;  Service: Cardiovascular;  Laterality: N/A;   TONSILLECTOMY  1962   Patient Active Problem List   Diagnosis Date Noted   DVT of right axillary vein, acute (HCC) 08/07/2022   Swelling of upper arm 08/03/2022   Abnormal INR 08/03/2022   SOB (shortness of breath) 07/28/2022   Urinary hesitancy 07/28/2022   Osteoporosis 07/28/2022   Right shoulder pain 07/27/2022   Dialysis AV fistula malfunction (HCC) 06/30/2022   Upper back pain 05/05/2022   Neck pain 05/05/2022   Hyperglycemia 05/05/2022   Disorder of vitamin B12 05/05/2022   Pulmonary hypertension, primary (HCC) 03/09/2021   Low TSH level 03/09/2021   Right knee pain 03/06/2021   Primary osteoarthritis of right knee 02/25/2021   Bilateral carotid artery stenosis 11/21/2020   Adnexal mass    Left foot drop 02/08/2020   A-V fistula (HCC) 08/03/2019   Nonrheumatic mitral valve regurgitation 06/12/2019   Edema 04/09/2019   Drug-induced constipation 10/18/2018   External prolapsed hemorrhoids 09/25/2018   Hyperphosphatemia 03/09/2018   Leg swelling 03/03/2018   Long term (current) use of anticoagulants 11/01/2017   Atrial fibrillation (HCC) 10/25/2017  Hyperparathyroidism, unspecified (HCC) 05/04/2017   DDD (degenerative disc disease), lumbar 04/07/2017   Hearing loss 01/04/2017   Hyperlipidemia 01/04/2017   Arthritis of fingers of both hands 10/01/2016   Chronic rhinitis 10/01/2016   Nonrheumatic aortic valve insufficiency 09/29/2016   Bruit 09/29/2016   Palpitation 09/22/2016   Aortic valve disease 04/28/2016   Anemia in ESRD (end-stage renal disease) (HCC) 03/03/2016   Sun-damaged skin 01/21/2016   ESRD (end stage renal disease) (HCC) 11/09/2015   Chronic kidney disease, stage 5 (HCC) 07/03/2015   Advanced care planning/counseling discussion 06/10/2014    Pedal edema 03/28/2014   Proteinuria 02/18/2014   Hypercalcemia 02/18/2014   Abnormal EKG 01/22/2014   HTN (hypertension) 01/22/2014   Dermatitis 06/30/2013   Abnormal thyroid function test 06/25/2013   Cervical cancer screening 06/10/2012   Preventative health care 11/09/2011   Multiple chemical sensitivity syndrome 06/25/2010   History of chicken pox 06/25/2010   Bladder polyps 06/25/2010   History of cardiac dysrhythmia 06/25/2010   Insomnia 06/24/2010   Fatigue 06/24/2010   Chronic headaches 06/24/2010   Allergy    Anemia    Depression     PCP: Reuel Derby, MD  REFERRING PROVIDER: Richardean Sale, DO  REFERRING DIAG: scapulothoracic bursitis L  Rationale for Evaluation and Treatment: Rehabilitation  THERAPY DIAG:  Pain in thoracic spine  Abnormal posture  Muscle spasm of back  Muscle weakness (generalized)  ONSET DATE: chronic  SUBJECTIVE:                                                                                                                                                                                           SUBJECTIVE STATEMENT: Couldn't sleep at all last night, pain kept me up.  L shape across both shoulders down L side  PERTINENT HISTORY:  Chronic L periscapular pain x 1 year after driving to visit family. Has tried chiropractic, injections, no relief.  Wants to try dry needling again.  Unable to tolerate sleeping or sitting in dialysis chair  PAIN:  Are you having pain? Yes: NPRS scale: 8/10 Pain location: L medial scapular border, now radiating into L lateral lower ribs Pain description: constant worse sitting soft chair and sleep Aggravating factors: see above Relieving factors: ice  PRECAUTIONS: Other: osteoporosis  RED FLAGS: None   WEIGHT BEARING RESTRICTIONS: No  FALLS:  Has patient fallen in last 6 months? No  LIVING ENVIRONMENT: Lives with: lives with their family Lives in: House/apartment Stairs: No Has following  equipment at home: None  OCCUPATION: retired  PLOF: Independent  PATIENT GOALS: sleep better   NEXT MD VISIT: 2 weeks  OBJECTIVE:  DIAGNOSTIC FINDINGS:  na  PATIENT SURVEYS:  Modified Oswestry 25/50   SCREENING FOR RED FLAGS:scapulothoracic bursitis L Bowel or bladder incontinence: No Spinal tumors: No Cauda equina syndrome: No Compression fracture: No Abdominal aneurysm: No  COGNITION: Overall cognitive status: Within functional limits for tasks assessed     SENSATION: WFL  POSTURE:  scoliosis, L shoulder elevated, slight rotation to R thoracic spine  PALPATION: "Sore" tender L lateral thoracic paraspinals, periscapular, supraspinatus PA segmental testing thoracic spine decreased mobility T 3, T 7. Lateral and post ribs with normal mobility, tender on L post lower ribs  LUMBAR ROM: NT Thoracic ROM wfl  all directions  Cervical spine wfl all directions Decreased movement noted with isolation of L 2st rib, seated cervical flex/SB   B shoulders wfl but R sholder with poor mechanics for final 15 degrees of movement MMT: R shoulder flex, abd 4/5 Otherwise symmetrical , wfl   GAIT: Distance walked: 42' in clnic, wnl  TODAY'S TREATMENT:                                                                                                                              DATE:   12/15/22  Manual Therapy: to decrease muscle spasm and pain and improve mobility STM/TPR to bil UT, L/S, rhomboids, L subscap release, cervical paraspinals, UPA mobs cervical spine, PA mobs thoracic spine grade 1-2,, skilled palpation and monitoring during dry needling. Trigger Point Dry-Needling  Treatment instructions: Expect mild to moderate muscle soreness. S/S of pneumothorax if dry needled over a lung field, and to seek immediate medical attention should they occur. Patient verbalized understanding of these instructions and education. Patient Consent Given: Yes Education handout provided:  Previously provided Muscles treated: bil UT, L/S, L C5-6 multifidi, bil T5/6 multifidi, L subscap, L rhomboids Electrical stimulation performed: No Parameters: N/A Treatment response/outcome: Twitch Response Elicited and Palpable Increase in Muscle Length   12/01/22 Therapeutic Exercise: to improve strength and mobility.  Demo, verbal and tactile cues throughout for technique. Nustep L3 x 5 min Shoulder circles at wall MMT Manual Therapy: to decrease muscle spasm and pain and improve mobility STM/TPR to L UT, L/S, rhomboids, cervical paraspinals, UPA mobs cervical spine, PA mobs thoracic spine grade 1-2, skilled palpation and monitoring during dry needling. Trigger Point Dry-Needling  Treatment instructions: Expect mild to moderate muscle soreness. S/S of pneumothorax if dry needled over a lung field, and to seek immediate medical attention should they occur. Patient verbalized understanding of these instructions and education. Patient Consent Given: Yes Education handout provided: Previously provided Muscles treated: L UT, L/S, L C5-6 multifidi Electrical stimulation performed: No Parameters: N/A Treatment response/outcome: Twitch Response Elicited and Palpable Increase in Muscle Length  11/24/22  Therapeutic Exercise: to improve strength and mobility.  Demo, verbal and tactile cues throughout for technique. Nustep L5 x 4 min  Manual Therapy: to decrease muscle spasm and pain and improve mobility STM/TPR to bil UT, L/S, rhomboids, cervical paraspinals,  UPA mobs cervical spine, skilled palpation and monitoring during dry needling. Kinesiotaping - Y tape applied to L levator scapule, 25% stretch, 0 stretch on anchors.  Trigger Point Dry-Needling  Treatment instructions: Expect mild to moderate muscle soreness. S/S of pneumothorax if dry needled over a lung field, and to seek immediate medical attention should they occur. Patient verbalized understanding of these instructions and  education. Patient Consent Given: Yes Education handout provided: Previously provided Muscles treated: bil UT, L/S, bil C5-6 multifidi Electrical stimulation performed: No Parameters: N/A Treatment response/outcome: Twitch Response Elicited and Palpable Increase in Muscle Length  PATIENT EDUCATION:  Education details: HEP update Person educated: Patient Education method: Explanation, Demonstration, Verbal cues, and Handouts Education comprehension: verbalized understanding and returned demonstration  HOME EXERCISE PROGRAM: Access Code: [email or web address] URL: [email or web address] Date: 11/03/2022 Prepared by: Harrie Foreman  Exercises - Seated Quadratus Lumborum Stretch in Chair  - 1 x daily - 7 x weekly - 1 sets - 3 reps - Seated Trunk Rotation Stretch  - 1 x daily - 7 x weekly - 1 sets - 10 reps - Seated Shoulder Horizontal Abduction - Thumbs Up  - 1 x daily - 7 x weekly - 1 sets - 10 reps - Standing Thoracic Open Book at Wall  - 1 x daily - 7 x weekly - 1 sets - 10 reps - Standing shoulder flexion wall slides  - 1 x daily - 7 x weekly - 1 sets - 10 reps - Seated Shoulder Rolls  - 1 x daily - 7 x weekly - 1 sets - 10 reps  ASSESSMENT:  CLINICAL IMPRESSION: Jillian Hayes Reported increased pain today impairing sleep in bil shoulders and throughout thoracic spine, so focused on manual therapy in this area.  Reported decreased pain following interventions.   Jillian Hayes continues to demonstrate potential for improvement and would benefit from continued skilled therapy to address impairments.     OBJECTIVE IMPAIRMENTS: decreased activity tolerance, decreased strength, increased fascial restrictions, impaired perceived functional ability, impaired UE functional use, and pain.   ACTIVITY LIMITATIONS: carrying, lifting, standing, sleeping, and reach over head  PARTICIPATION LIMITATIONS: meal prep  PERSONAL FACTORS: Age, Behavior pattern, Past/current experiences, Time  since onset of injury/illness/exacerbation, and 3+ comorbidities: ESRD, osteoporosis,   are also affecting patient's functional outcome.   REHAB POTENTIAL: Fair    CLINICAL DECISION MAKING: Evolving/moderate complexity  EVALUATION COMPLEXITY: Moderate   GOALS: Goals reviewed with patient? Yes  SHORT TERM GOALS: Target date: 11/10/22  I HEP Baseline: Goal status: MET 11/03/22- given 11/17/22 compliant   LONG TERM GOALS: Target date: 12/03/22 extended to 01/26/23  Modified Oswestry improve to 15/50 Baseline: 25/50 Goal status: IN PROGRESS  2.  Strength B shoulder horizontal abd(middle traps, and lower traps 5/5) Baseline: TBD Goal status: MET 12/01/22  3.  Sleeping improve to greater than 2 hrs without pain L scapulothoracic region Baseline:  Goal status: IN PROGRESS 11/17/22- was sleeping better but worse last 2 days.  12/01/22- varies, some days sleeps well, other days has great difficulty sleeping.   PLAN:  PT FREQUENCY: 1x/week  PT DURATION: 8 weeks  PLANNED INTERVENTIONS: Therapeutic exercises, Therapeutic activity, Neuromuscular re-education, Balance training, Gait training, Patient/Family education, Self Care, and Joint mobilization.  PLAN FOR NEXT SESSION: continue to progress scapular strengthening, mobility, manual therapy, TrDN  Jena Gauss, PT, DPT  12/15/2022, 5:43 PM

## 2022-12-16 ENCOUNTER — Telehealth (HOSPITAL_BASED_OUTPATIENT_CLINIC_OR_DEPARTMENT_OTHER): Payer: Self-pay

## 2022-12-19 ENCOUNTER — Ambulatory Visit (HOSPITAL_BASED_OUTPATIENT_CLINIC_OR_DEPARTMENT_OTHER)
Admission: RE | Admit: 2022-12-19 | Discharge: 2022-12-19 | Disposition: A | Discharge status: Home / Self Care | Payer: Medicare HMO | Source: Ambulatory Visit | Attending: Family Medicine | Admitting: Family Medicine

## 2022-12-19 DIAGNOSIS — E042 Nontoxic multinodular goiter: Secondary | ICD-10-CM | POA: Insufficient documentation

## 2022-12-22 ENCOUNTER — Ambulatory Visit: Payer: Medicare HMO | Admitting: Physical Therapy

## 2022-12-22 ENCOUNTER — Encounter: Payer: Self-pay | Admitting: Physical Therapy

## 2022-12-22 DIAGNOSIS — M6281 Muscle weakness (generalized): Secondary | ICD-10-CM

## 2022-12-22 DIAGNOSIS — M62830 Muscle spasm of back: Secondary | ICD-10-CM

## 2022-12-22 DIAGNOSIS — M546 Pain in thoracic spine: Secondary | ICD-10-CM | POA: Diagnosis not present

## 2022-12-22 DIAGNOSIS — R293 Abnormal posture: Secondary | ICD-10-CM

## 2022-12-22 NOTE — Therapy (Signed)
OUTPATIENT PHYSICAL THERAPY TREATMENT  Patient Name: Jillian Hayes MRN: [id number] DOB:1945/12/09, 77 y.o., female Today's Date: 12/22/2022  END OF SESSION:  PT End of Session - 12/22/22 1624     Visit Number 9    Date for PT Re-Evaluation 01/26/23    Progress Note Due on Visit 15    PT Start Time 1537    PT Stop Time 1622    PT Time Calculation (min) 45 min    Activity Tolerance Patient limited by pain;Patient tolerated treatment well    Behavior During Therapy [facility] for tasks assessed/performed             Past Medical History:  Diagnosis Date   Advanced care planning/counseling discussion 06/10/2014   05/31/2014 patient presents copy of HCP and Living Will   Anemia    iron deficiency   Anxiety    Arthritis    "in hands"   Atrial fibrillation (HCC)    Benign fundic gland polyps of stomach    Bladder polyps 06/25/2010   Chronic headaches 06/24/2010   Complication of anesthesia    "woke up at the end of a cyst removal surgery in 1991"   Depression 1991   hospitalized   Diverticulosis    DVT of right axillary vein, acute (HCC) 08/07/2022   ESRD (end stage renal disease) (HCC) 11/09/2015   HD on MWF   External prolapsed hemorrhoids    History of chicken pox 06/25/2010   Hypercalcemia 02/18/2014   Hypertension    Insomnia 06/24/2010   Multiple chemical sensitivity syndrome 06/25/2010   Proteinuria 02/18/2014   Renal insufficiency 03/26/2011   Valvular heart disease 04/28/2016   Past Surgical History:  Procedure Laterality Date   AV FISTULA PLACEMENT Left    x4   COLONOSCOPY  2014   cyst on left breast removed Left 1991   benign   ESOPHAGOGASTRODUODENOSCOPY (EGD) WITH ESOPHAGEAL DILATION  2014   LAPAROSCOPIC BILATERAL SALPINGO OOPHERECTOMY Bilateral 08/20/2020   Procedure: LAPAROSCOPIC BILATERAL SALPINGO OOPHORECTOMY;  Surgeon: Jerene Bears, MD;  Location: [facility];  Service: Gynecology;  Laterality: Bilateral;   NASAL SEPTUM SURGERY  1986   rhinoplasty    polyps on bladder removed  1972   benign   RIGHT HEART CATH N/A 04/10/2021   Procedure: RIGHT HEART CATH;  Surgeon: Laurey Morale, MD;  Location: [facility] INVASIVE CV LAB;  Service: Cardiovascular;  Laterality: N/A;   TONSILLECTOMY  1962   Patient Active Problem List   Diagnosis Date Noted   DVT of right axillary vein, acute (HCC) 08/07/2022   Swelling of upper arm 08/03/2022   Abnormal INR 08/03/2022   SOB (shortness of breath) 07/28/2022   Urinary hesitancy 07/28/2022   Osteoporosis 07/28/2022   Right shoulder pain 07/27/2022   Dialysis AV fistula malfunction (HCC) 06/30/2022   Upper back pain 05/05/2022   Neck pain 05/05/2022   Hyperglycemia 05/05/2022   Disorder of vitamin B12 05/05/2022   Pulmonary hypertension, primary (HCC) 03/09/2021   Low TSH level 03/09/2021   Right knee pain 03/06/2021   Primary osteoarthritis of right knee 02/25/2021   Bilateral carotid artery stenosis 11/21/2020   Adnexal mass    Left foot drop 02/08/2020   A-V fistula (HCC) 08/03/2019   Nonrheumatic mitral valve regurgitation 06/12/2019   Edema 04/09/2019   Drug-induced constipation 10/18/2018   External prolapsed hemorrhoids 09/25/2018   Hyperphosphatemia 03/09/2018   Leg swelling 03/03/2018   Long term (current) use of anticoagulants 11/01/2017   Atrial fibrillation (HCC) 10/25/2017  Hyperparathyroidism, unspecified (HCC) 05/04/2017   DDD (degenerative disc disease), lumbar 04/07/2017   Hearing loss 01/04/2017   Hyperlipidemia 01/04/2017   Arthritis of fingers of both hands 10/01/2016   Chronic rhinitis 10/01/2016   Nonrheumatic aortic valve insufficiency 09/29/2016   Bruit 09/29/2016   Palpitation 09/22/2016   Aortic valve disease 04/28/2016   Anemia in ESRD (end-stage renal disease) (HCC) 03/03/2016   Sun-damaged skin 01/21/2016   ESRD (end stage renal disease) (HCC) 11/09/2015   Chronic kidney disease, stage 5 (HCC) 07/03/2015   Advanced care planning/counseling discussion 06/10/2014    Pedal edema 03/28/2014   Proteinuria 02/18/2014   Hypercalcemia 02/18/2014   Abnormal EKG 01/22/2014   HTN (hypertension) 01/22/2014   Dermatitis 06/30/2013   Abnormal thyroid function test 06/25/2013   Cervical cancer screening 06/10/2012   Preventative health care 11/09/2011   Multiple chemical sensitivity syndrome 06/25/2010   History of chicken pox 06/25/2010   Bladder polyps 06/25/2010   History of cardiac dysrhythmia 06/25/2010   Insomnia 06/24/2010   Fatigue 06/24/2010   Chronic headaches 06/24/2010   Allergy    Anemia    Depression     PCP: Reuel Derby, MD  REFERRING PROVIDER: Richardean Sale, DO  REFERRING DIAG: scapulothoracic bursitis L  Rationale for Evaluation and Treatment: Rehabilitation  THERAPY DIAG:  Pain in thoracic spine  Abnormal posture  Muscle spasm of back  Muscle weakness (generalized)  ONSET DATE: chronic  SUBJECTIVE:                                                                                                                                                                                           SUBJECTIVE STATEMENT: Still having severe pain at night and at dialysis, just miserable.  Everything she's tried just helps for a couple of days.  PERTINENT HISTORY:  Chronic L periscapular pain x 1 year after driving to visit family. Has tried chiropractic, injections, no relief.  Wants to try dry needling again.  Unable to tolerate sleeping or sitting in dialysis chair  PAIN:  Are you having pain? Yes: NPRS scale: 7/10 Pain location: L medial scapular border, now radiating into L lateral lower ribs Pain description: constant worse sitting soft chair and sleep Aggravating factors: see above Relieving factors: ice  PRECAUTIONS: Other: osteoporosis  RED FLAGS: None   WEIGHT BEARING RESTRICTIONS: No  FALLS:  Has patient fallen in last 6 months? No  LIVING ENVIRONMENT: Lives with: lives with their family Lives in:  House/apartment Stairs: No Has following equipment at home: None  OCCUPATION: retired  PLOF: Independent  PATIENT GOALS: sleep better   NEXT MD VISIT: 2 weeks  OBJECTIVE:   DIAGNOSTIC FINDINGS:  na  PATIENT SURVEYS:  Modified Oswestry 25/50   SCREENING FOR RED FLAGS:scapulothoracic bursitis L Bowel or bladder incontinence: No Spinal tumors: No Cauda equina syndrome: No Compression fracture: No Abdominal aneurysm: No  COGNITION: Overall cognitive status: Within functional limits for tasks assessed     SENSATION: WFL  POSTURE:  scoliosis, L shoulder elevated, slight rotation to R thoracic spine  PALPATION: "Sore" tender L lateral thoracic paraspinals, periscapular, supraspinatus PA segmental testing thoracic spine decreased mobility T 3, T 7. Lateral and post ribs with normal mobility, tender on L post lower ribs  LUMBAR ROM: NT Thoracic ROM wfl  all directions  Cervical spine wfl all directions Decreased movement noted with isolation of L 2st rib, seated cervical flex/SB   B shoulders wfl but R sholder with poor mechanics for final 15 degrees of movement MMT: R shoulder flex, abd 4/5 Otherwise symmetrical , wfl   GAIT: Distance walked: 70' in clnic, wnl  TODAY'S TREATMENT:                                                                                                                              DATE:   12/22/22 Manual Therapy: to decrease muscle spasm and pain and improve mobility STM/TPR to bil UT, L/S, rhomboids,thoracic erector spinae, cervical paraspinals, PA mobs thoracic spine grade 1-2,, skilled palpation and monitoring during dry needling. Trigger Point Dry-Needling  Treatment instructions: Expect mild to moderate muscle soreness. S/S of pneumothorax if dry needled over a lung field, and to seek immediate medical attention should they occur. Patient verbalized understanding of these instructions and education. Patient Consent Given: Yes Education  handout provided: Previously provided Muscles treated: bil UT, L/S, L Thoracic  multifidi T4-8, R infraspinatus Electrical stimulation performed: No Parameters: N/A Treatment response/outcome: Twitch Response Elicited and Palpable Increase in Muscle Length Self Care: Education on TENS,  set up and trialed TENS to L rhomboids information on inexpensive unit  12/15/22  Manual Therapy: to decrease muscle spasm and pain and improve mobility STM/TPR to bil UT, L/S, rhomboids, L subscap release, cervical paraspinals, UPA mobs cervical spine, PA mobs thoracic spine grade 1-2,, skilled palpation and monitoring during dry needling. Trigger Point Dry-Needling  Treatment instructions: Expect mild to moderate muscle soreness. S/S of pneumothorax if dry needled over a lung field, and to seek immediate medical attention should they occur. Patient verbalized understanding of these instructions and education. Patient Consent Given: Yes Education handout provided: Previously provided Muscles treated: bil UT, L/S, L C5-6 multifidi, bil T5/6 multifidi, L subscap, L rhomboids Electrical stimulation performed: No Parameters: N/A Treatment response/outcome: Twitch Response Elicited and Palpable Increase in Muscle Length   12/01/22 Therapeutic Exercise: to improve strength and mobility.  Demo, verbal and tactile cues throughout for technique. Nustep L3 x 5 min Shoulder circles at wall MMT Manual Therapy: to decrease muscle spasm and pain and improve mobility STM/TPR to L UT, L/S, rhomboids, cervical paraspinals,  UPA mobs cervical spine, PA mobs thoracic spine grade 1-2, skilled palpation and monitoring during dry needling. Trigger Point Dry-Needling  Treatment instructions: Expect mild to moderate muscle soreness. S/S of pneumothorax if dry needled over a lung field, and to seek immediate medical attention should they occur. Patient verbalized understanding of these instructions and education. Patient Consent  Given: Yes Education handout provided: Previously provided Muscles treated: L UT, L/S, L C5-6 multifidi Electrical stimulation performed: No Parameters: N/A Treatment response/outcome: Twitch Response Elicited and Palpable Increase in Muscle Length  PATIENT EDUCATION:  Education details: HEP update Person educated: Patient Education method: Explanation, Demonstration, Verbal cues, and Handouts Education comprehension: verbalized understanding and returned demonstration  HOME EXERCISE PROGRAM: Access Code: [email or web address] URL: [email or web address] Date: 11/03/2022 Prepared by: Harrie Foreman  Exercises - Seated Quadratus Lumborum Stretch in Chair  - 1 x daily - 7 x weekly - 1 sets - 3 reps - Seated Trunk Rotation Stretch  - 1 x daily - 7 x weekly - 1 sets - 10 reps - Seated Shoulder Horizontal Abduction - Thumbs Up  - 1 x daily - 7 x weekly - 1 sets - 10 reps - Standing Thoracic Open Book at Wall  - 1 x daily - 7 x weekly - 1 sets - 10 reps - Standing shoulder flexion wall slides  - 1 x daily - 7 x weekly - 1 sets - 10 reps - Seated Shoulder Rolls  - 1 x daily - 7 x weekly - 1 sets - 10 reps  ASSESSMENT:  CLINICAL IMPRESSION: Jillian Hayes continue to report increased pain and spasm interfering with sleep and dialysis.  She talked to her dialysis doctor who had no new recommendations.  Today we discussed trialing TENS, which could be worn during dialysis to try to distract/interrupt pain signals and trialed on L rhomboids, was able to find comfortable setting, provided information sheet for how to purchase.  Jillian Hayes continues to demonstrate potential for improvement and would benefit from continued skilled therapy to address impairments.     OBJECTIVE IMPAIRMENTS: decreased activity tolerance, decreased strength, increased fascial restrictions, impaired perceived functional ability, impaired UE functional use, and pain.   ACTIVITY LIMITATIONS: carrying, lifting,  standing, sleeping, and reach over head  PARTICIPATION LIMITATIONS: meal prep  PERSONAL FACTORS: Age, Behavior pattern, Past/current experiences, Time since onset of injury/illness/exacerbation, and 3+ comorbidities: ESRD, osteoporosis,   are also affecting patient's functional outcome.   REHAB POTENTIAL: Fair    CLINICAL DECISION MAKING: Evolving/moderate complexity  EVALUATION COMPLEXITY: Moderate   GOALS: Goals reviewed with patient? Yes  SHORT TERM GOALS: Target date: 11/10/22  I HEP Baseline: Goal status: MET 11/03/22- given 11/17/22 compliant   LONG TERM GOALS: Target date: 12/03/22 extended to 01/26/23  Modified Oswestry improve to 15/50 Baseline: 25/50 Goal status: IN PROGRESS  2.  Strength B shoulder horizontal abd(middle traps, and lower traps 5/5) Baseline: TBD Goal status: MET 12/01/22  3.  Sleeping improve to greater than 2 hrs without pain L scapulothoracic region Baseline:  Goal status: IN PROGRESS 11/17/22- was sleeping better but worse last 2 days.  12/01/22- varies, some days sleeps well, other days has great difficulty sleeping.   PLAN:  PT FREQUENCY: 1x/week  PT DURATION: 8 weeks  PLANNED INTERVENTIONS: Therapeutic exercises, Therapeutic activity, Neuromuscular re-education, Balance training, Gait training, Patient/Family education, Self Care, and Joint mobilization.  PLAN FOR NEXT SESSION: continue to progress scapular strengthening, mobility, manual therapy, TrDN  Jena Gauss, PT, DPT  12/22/2022, 5:19  PM

## 2022-12-23 ENCOUNTER — Encounter (INDEPENDENT_AMBULATORY_CARE_PROVIDER_SITE_OTHER): Payer: Self-pay | Admitting: Otolaryngology

## 2022-12-24 DIAGNOSIS — C089 Malignant neoplasm of major salivary gland, unspecified: Secondary | ICD-10-CM | POA: Diagnosis not present

## 2022-12-24 DIAGNOSIS — D490 Neoplasm of unspecified behavior of digestive system: Secondary | ICD-10-CM | POA: Diagnosis not present

## 2022-12-28 ENCOUNTER — Encounter: Payer: Self-pay | Admitting: Physical Therapy

## 2022-12-28 ENCOUNTER — Ambulatory Visit: Payer: Medicare HMO | Admitting: Physical Therapy

## 2022-12-28 DIAGNOSIS — M546 Pain in thoracic spine: Secondary | ICD-10-CM

## 2022-12-28 DIAGNOSIS — M62830 Muscle spasm of back: Secondary | ICD-10-CM | POA: Diagnosis not present

## 2022-12-28 DIAGNOSIS — R293 Abnormal posture: Secondary | ICD-10-CM | POA: Diagnosis not present

## 2022-12-28 DIAGNOSIS — M6281 Muscle weakness (generalized): Secondary | ICD-10-CM | POA: Diagnosis not present

## 2022-12-28 NOTE — Therapy (Signed)
OUTPATIENT PHYSICAL THERAPY TREATMENT Progress Note Reporting Period 10/08/2022 to 12/28/2022  See note below for Objective Data and Assessment of Progress/Goals.     Patient Name: Jillian Hayes MRN: [id number] DOB:05-27-1945, 76 y.o., female Today's Date: 12/28/2022  END OF SESSION:  PT End of Session - 12/28/22 1318     Visit Number 10    Date for PT Re-Evaluation 01/26/23    Progress Note Due on Visit 15    PT Start Time 1318    PT Stop Time 1400    PT Time Calculation (min) 42 min    Activity Tolerance Patient limited by pain;Patient tolerated treatment well    Behavior During Therapy [facility] for tasks assessed/performed             Past Medical History:  Diagnosis Date   Advanced care planning/counseling discussion 06/10/2014   05/31/2014 patient presents copy of HCP and Living Will   Anemia    iron deficiency   Anxiety    Arthritis    "in hands"   Atrial fibrillation (HCC)    Benign fundic gland polyps of stomach    Bladder polyps 06/25/2010   Chronic headaches 06/24/2010   Complication of anesthesia    "woke up at the end of a cyst removal surgery in 1991"   Depression 1991   hospitalized   Diverticulosis    DVT of right axillary vein, acute (HCC) 08/07/2022   ESRD (end stage renal disease) (HCC) 11/09/2015   HD on MWF   External prolapsed hemorrhoids    History of chicken pox 06/25/2010   Hypercalcemia 02/18/2014   Hypertension    Insomnia 06/24/2010   Multiple chemical sensitivity syndrome 06/25/2010   Proteinuria 02/18/2014   Renal insufficiency 03/26/2011   Valvular heart disease 04/28/2016   Past Surgical History:  Procedure Laterality Date   AV FISTULA PLACEMENT Left    x4   COLONOSCOPY  2014   cyst on left breast removed Left 1991   benign   ESOPHAGOGASTRODUODENOSCOPY (EGD) WITH ESOPHAGEAL DILATION  2014   LAPAROSCOPIC BILATERAL SALPINGO OOPHERECTOMY Bilateral 08/20/2020   Procedure: LAPAROSCOPIC BILATERAL SALPINGO OOPHORECTOMY;  Surgeon:  Jerene Bears, MD;  Location: [facility] OR;  Service: Gynecology;  Laterality: Bilateral;   NASAL SEPTUM SURGERY  1986   rhinoplasty   polyps on bladder removed  1972   benign   RIGHT HEART CATH N/A 04/10/2021   Procedure: RIGHT HEART CATH;  Surgeon: Laurey Morale, MD;  Location: [facility] INVASIVE CV LAB;  Service: Cardiovascular;  Laterality: N/A;   TONSILLECTOMY  1962   Patient Active Problem List   Diagnosis Date Noted   DVT of right axillary vein, acute (HCC) 08/07/2022   Swelling of upper arm 08/03/2022   Abnormal INR 08/03/2022   SOB (shortness of breath) 07/28/2022   Urinary hesitancy 07/28/2022   Osteoporosis 07/28/2022   Right shoulder pain 07/27/2022   Dialysis AV fistula malfunction (HCC) 06/30/2022   Upper back pain 05/05/2022   Neck pain 05/05/2022   Hyperglycemia 05/05/2022   Disorder of vitamin B12 05/05/2022   Pulmonary hypertension, primary (HCC) 03/09/2021   Low TSH level 03/09/2021   Right knee pain 03/06/2021   Primary osteoarthritis of right knee 02/25/2021   Bilateral carotid artery stenosis 11/21/2020   Adnexal mass    Left foot drop 02/08/2020   A-V fistula (HCC) 08/03/2019   Nonrheumatic mitral valve regurgitation 06/12/2019   Edema 04/09/2019   Drug-induced constipation 10/18/2018   External prolapsed hemorrhoids 09/25/2018   Hyperphosphatemia 03/09/2018  Leg swelling 03/03/2018   Long term (current) use of anticoagulants 11/01/2017   Atrial fibrillation (HCC) 10/25/2017   Hyperparathyroidism, unspecified (HCC) 05/04/2017   DDD (degenerative disc disease), lumbar 04/07/2017   Hearing loss 01/04/2017   Hyperlipidemia 01/04/2017   Arthritis of fingers of both hands 10/01/2016   Chronic rhinitis 10/01/2016   Nonrheumatic aortic valve insufficiency 09/29/2016   Bruit 09/29/2016   Palpitation 09/22/2016   Aortic valve disease 04/28/2016   Anemia in ESRD (end-stage renal disease) (HCC) 03/03/2016   Sun-damaged skin 01/21/2016   ESRD (end stage renal  disease) (HCC) 11/09/2015   Chronic kidney disease, stage 5 (HCC) 07/03/2015   Advanced care planning/counseling discussion 06/10/2014   Pedal edema 03/28/2014   Proteinuria 02/18/2014   Hypercalcemia 02/18/2014   Abnormal EKG 01/22/2014   HTN (hypertension) 01/22/2014   Dermatitis 06/30/2013   Abnormal thyroid function test 06/25/2013   Cervical cancer screening 06/10/2012   Preventative health care 11/09/2011   Multiple chemical sensitivity syndrome 06/25/2010   History of chicken pox 06/25/2010   Bladder polyps 06/25/2010   History of cardiac dysrhythmia 06/25/2010   Insomnia 06/24/2010   Fatigue 06/24/2010   Chronic headaches 06/24/2010   Allergy    Anemia    Depression     PCP: Reuel Derby, MD  REFERRING PROVIDER: Richardean Sale, DO  REFERRING DIAG: scapulothoracic bursitis L  Rationale for Evaluation and Treatment: Rehabilitation  THERAPY DIAG:  Pain in thoracic spine  Abnormal posture  Muscle spasm of back  Muscle weakness (generalized)  ONSET DATE: chronic  SUBJECTIVE:                                                                                                                                                                                           SUBJECTIVE STATEMENT: Still hurting, everything she's tried just helps for a couple of days.  Ordered the TENS but then saw it said not to use if you were pregnant or had HTN, didn't bring it today.   PERTINENT HISTORY:  Chronic L periscapular pain x 1 year after driving to visit family. Has tried chiropractic, injections, no relief.  Wants to try dry needling again.  Unable to tolerate sleeping or sitting in dialysis chair  PAIN:  Are you having pain? Yes: NPRS scale: 7/10 Pain location: L medial scapular border, now radiating into L lateral lower ribs Pain description: constant worse sitting soft chair and sleep Aggravating factors: see above Relieving factors: ice  PRECAUTIONS: Other:  osteoporosis  RED FLAGS: None   WEIGHT BEARING RESTRICTIONS: No  FALLS:  Has patient fallen in last 6 months? No  LIVING ENVIRONMENT: Lives with:  lives with their family Lives in: House/apartment Stairs: No Has following equipment at home: None  OCCUPATION: retired  PLOF: Independent  PATIENT GOALS: sleep better   NEXT MD VISIT: 2 weeks  OBJECTIVE:   DIAGNOSTIC FINDINGS:  na  PATIENT SURVEYS:  Modified Oswestry 25/50   SCREENING FOR RED FLAGS:scapulothoracic bursitis L Bowel or bladder incontinence: No Spinal tumors: No Cauda equina syndrome: No Compression fracture: No Abdominal aneurysm: No  COGNITION: Overall cognitive status: Within functional limits for tasks assessed     SENSATION: WFL  POSTURE:  scoliosis, L shoulder elevated, slight rotation to R thoracic spine  PALPATION: "Sore" tender L lateral thoracic paraspinals, periscapular, supraspinatus PA segmental testing thoracic spine decreased mobility T 3, T 7. Lateral and post ribs with normal mobility, tender on L post lower ribs  LUMBAR ROM: NT Thoracic ROM wfl  all directions  Cervical spine wfl all directions Decreased movement noted with isolation of L 2st rib, seated cervical flex/SB   B shoulders wfl but R sholder with poor mechanics for final 15 degrees of movement MMT: R shoulder flex, abd 4/5 Otherwise symmetrical , wfl   GAIT: Distance walked: 31' in clnic, wnl  TODAY'S TREATMENT:                                                                                                                              DATE:   12/28/22 Therapeutic Exercise: to improve strength and mobility.  Demo, verbal and tactile cues throughout for technique. Rows with YTB x 15 Shoulder extension YTB x 15 Review of HEP - stretches, wall pushups  - corrected added to HEP Manual Therapy: to decrease muscle spasm and pain and improve mobility STM/TPR to bil UT, L/S, rhomboids,thoracic erector spinae,  cervical paraspinals, PA mobs thoracic spine grade 1-2,, skilled palpation and monitoring during dry needling. Trigger Point Dry-Needling  Treatment instructions: Expect mild to moderate muscle soreness. S/S of pneumothorax if dry needled over a lung field, and to seek immediate medical attention should they occur. Patient verbalized understanding of these instructions and education. Patient Consent Given: Yes Education handout provided: Previously provided Muscles treated: bil UT, L/S, L Thoracic  multifidi T4-8, R infraspinatus Electrical stimulation performed: No Parameters: N/A Treatment response/outcome: Twitch Response Elicited and Palpable Increase in Muscle Length  12/22/22 Manual Therapy: to decrease muscle spasm and pain and improve mobility STM/TPR to bil UT, L/S, rhomboids,thoracic erector spinae, cervical paraspinals, PA mobs thoracic spine grade 1-2,, skilled palpation and monitoring during dry needling. Trigger Point Dry-Needling  Treatment instructions: Expect mild to moderate muscle soreness. S/S of pneumothorax if dry needled over a lung field, and to seek immediate medical attention should they occur. Patient verbalized understanding of these instructions and education. Patient Consent Given: Yes Education handout provided: Previously provided Muscles treated: bil UT, L/S, L Thoracic  multifidi T4-8, R infraspinatus Electrical stimulation performed: No Parameters: N/A Treatment response/outcome: Twitch Response Elicited and Palpable Increase in Muscle Length Self Care:  Education on TENS,  set up and trialed TENS to L rhomboids information on inexpensive unit  12/15/22  Manual Therapy: to decrease muscle spasm and pain and improve mobility STM/TPR to bil UT, L/S, rhomboids, L subscap release, cervical paraspinals, UPA mobs cervical spine, PA mobs thoracic spine grade 1-2,, skilled palpation and monitoring during dry needling. Trigger Point Dry-Needling  Treatment  instructions: Expect mild to moderate muscle soreness. S/S of pneumothorax if dry needled over a lung field, and to seek immediate medical attention should they occur. Patient verbalized understanding of these instructions and education. Patient Consent Given: Yes Education handout provided: Previously provided Muscles treated: bil UT, L/S, L C5-6 multifidi, bil T5/6 multifidi, L subscap, L rhomboids Electrical stimulation performed: No Parameters: N/A Treatment response/outcome: Twitch Response Elicited and Palpable Increase in Muscle Length  PATIENT EDUCATION:  Education details: HEP update Person educated: Patient Education method: Explanation, Demonstration, Verbal cues, and Handouts Education comprehension: verbalized understanding and returned demonstration  HOME EXERCISE PROGRAM: Access Code: [email or web address] URL: [email or web address] Date: 12/28/2022 Prepared by: Harrie Foreman  Exercises - Seated Quadratus Lumborum Stretch in Chair  - 1 x daily - 7 x weekly - 1 sets - 3 reps - Seated Trunk Rotation Stretch  - 1 x daily - 7 x weekly - 1 sets - 10 reps - Seated Shoulder Horizontal Abduction - Thumbs Up  - 1 x daily - 7 x weekly - 1 sets - 10 reps - Standing Thoracic Open Book at Wall  - 1 x daily - 7 x weekly - 1 sets - 10 reps - Standing shoulder flexion wall slides  - 1 x daily - 7 x weekly - 1 sets - 10 reps - Seated Shoulder Rolls  - 1 x daily - 7 x weekly - 1 sets - 10 reps - Shoulder extension with resistance - Neutral  - 1 x daily - 7 x weekly - 2 sets - 10 reps - Scapular Retraction with Resistance  - 1 x daily - 7 x weekly - 2 sets - 10 reps - Wall Push Up  - 1 x daily - 7 x weekly - 2 sets - 10 reps  ASSESSMENT:  CLINICAL IMPRESSION: SKII KLAWITER continue to report increased pain and spasm interfering with sleep and dialysis.  Discussed TENS, reassured that there were no serious contraindication to her using the TENS.  Also dicussed her HEP, she is  focusing primarily on stretches, today introduced more postural strengthening exercises which she tolerated without pain.  Updated HEP and issued YTB, also ok'd her to resume other exercises at home with weight since they do not aggravate her pain.   Continued with manual therapy to decrease muscle spasm and pain, reported decreased pain afterwards.   NEFTALY BRIDSON continues to demonstrate potential for improvement and would benefit from continued skilled therapy to address impairments.     OBJECTIVE IMPAIRMENTS: decreased activity tolerance, decreased strength, increased fascial restrictions, impaired perceived functional ability, impaired UE functional use, and pain.   ACTIVITY LIMITATIONS: carrying, lifting, standing, sleeping, and reach over head  PARTICIPATION LIMITATIONS: meal prep  PERSONAL FACTORS: Age, Behavior pattern, Past/current experiences, Time since onset of injury/illness/exacerbation, and 3+ comorbidities: ESRD, osteoporosis,   are also affecting patient's functional outcome.   REHAB POTENTIAL: Fair    CLINICAL DECISION MAKING: Evolving/moderate complexity  EVALUATION COMPLEXITY: Moderate   GOALS: Goals reviewed with patient? Yes  SHORT TERM GOALS: Target date: 11/10/22  I HEP Baseline: Goal status: MET 11/03/22- given 11/17/22 compliant  LONG TERM GOALS: Target date: 12/03/22 extended to 01/26/23  Modified Oswestry improve to 15/50 Baseline: 25/50 Goal status: IN PROGRESS 12/28/22- no change.   2.  Strength B shoulder horizontal abd(middle traps, and lower traps 5/5) Baseline: TBD Goal status: MET 12/01/22  3.  Sleeping improve to greater than 2 hrs without pain L scapulothoracic region Baseline:  Goal status: IN PROGRESS 11/17/22- was sleeping better but worse last 2 days.  12/01/22- varies, some days sleeps well, other days has great difficulty sleeping.  12/28/22- no change  PLAN:  PT FREQUENCY: 1x/week  PT DURATION: 8 weeks  PLANNED  INTERVENTIONS: Therapeutic exercises, Therapeutic activity, Neuromuscular re-education, Balance training, Gait training, Patient/Family education, Self Care, and Joint mobilization.  PLAN FOR NEXT SESSION: continue to progress scapular strengthening, mobility, manual therapy, TrDN  Jena Gauss, PT, DPT  12/28/2022, 2:16 PM

## 2022-12-29 ENCOUNTER — Encounter: Payer: Medicare HMO | Admitting: Physical Therapy

## 2023-01-02 DIAGNOSIS — Z992 Dependence on renal dialysis: Secondary | ICD-10-CM | POA: Diagnosis not present

## 2023-01-02 DIAGNOSIS — N186 End stage renal disease: Secondary | ICD-10-CM | POA: Diagnosis not present

## 2023-01-05 ENCOUNTER — Ambulatory Visit: Payer: Medicare HMO | Attending: Sports Medicine | Admitting: Physical Therapy

## 2023-01-05 ENCOUNTER — Encounter: Payer: Self-pay | Admitting: Physical Therapy

## 2023-01-05 DIAGNOSIS — M62830 Muscle spasm of back: Secondary | ICD-10-CM | POA: Diagnosis not present

## 2023-01-05 DIAGNOSIS — M546 Pain in thoracic spine: Secondary | ICD-10-CM | POA: Diagnosis not present

## 2023-01-05 DIAGNOSIS — R293 Abnormal posture: Secondary | ICD-10-CM | POA: Diagnosis not present

## 2023-01-05 NOTE — Therapy (Signed)
OUTPATIENT PHYSICAL THERAPY TREATMENT   Patient Name: Jillian Hayes MRN: [id number] DOB:27-Jul-1945, 77 y.o., female Today's Date: 01/05/2023  END OF SESSION:  PT End of Session - 01/05/23 1550     Visit Number 11    Date for PT Re-Evaluation 01/26/23    Authorization Type [employer]    Progress Note Due on Visit 15    PT Start Time 1539    PT Stop Time 1617    PT Time Calculation (min) 38 min    Activity Tolerance Patient limited by pain;Patient tolerated treatment well    Behavior During Therapy [facility] for tasks assessed/performed             Past Medical History:  Diagnosis Date   Advanced care planning/counseling discussion 06/10/2014   05/31/2014 patient presents copy of HCP and Living Will   Anemia    iron deficiency   Anxiety    Arthritis    "in hands"   Atrial fibrillation (HCC)    Benign fundic gland polyps of stomach    Bladder polyps 06/25/2010   Chronic headaches 06/24/2010   Complication of anesthesia    "woke up at the end of a cyst removal surgery in 1991"   Depression 1991   hospitalized   Diverticulosis    DVT of right axillary vein, acute (HCC) 08/07/2022   ESRD (end stage renal disease) (HCC) 11/09/2015   HD on MWF   External prolapsed hemorrhoids    History of chicken pox 06/25/2010   Hypercalcemia 02/18/2014   Hypertension    Insomnia 06/24/2010   Multiple chemical sensitivity syndrome 06/25/2010   Proteinuria 02/18/2014   Renal insufficiency 03/26/2011   Valvular heart disease 04/28/2016   Past Surgical History:  Procedure Laterality Date   AV FISTULA PLACEMENT Left    x4   COLONOSCOPY  2014   cyst on left breast removed Left 1991   benign   ESOPHAGOGASTRODUODENOSCOPY (EGD) WITH ESOPHAGEAL DILATION  2014   LAPAROSCOPIC BILATERAL SALPINGO OOPHERECTOMY Bilateral 08/20/2020   Procedure: LAPAROSCOPIC BILATERAL SALPINGO OOPHORECTOMY;  Surgeon: Jerene Bears, MD;  Location: [facility] OR;  Service: Gynecology;  Laterality: Bilateral;   NASAL  SEPTUM SURGERY  1986   rhinoplasty   polyps on bladder removed  1972   benign   RIGHT HEART CATH N/A 04/10/2021   Procedure: RIGHT HEART CATH;  Surgeon: Laurey Morale, MD;  Location: [facility] INVASIVE CV LAB;  Service: Cardiovascular;  Laterality: N/A;   TONSILLECTOMY  1962   Patient Active Problem List   Diagnosis Date Noted   DVT of right axillary vein, acute (HCC) 08/07/2022   Swelling of upper arm 08/03/2022   Abnormal INR 08/03/2022   SOB (shortness of breath) 07/28/2022   Urinary hesitancy 07/28/2022   Osteoporosis 07/28/2022   Right shoulder pain 07/27/2022   Dialysis AV fistula malfunction (HCC) 06/30/2022   Upper back pain 05/05/2022   Neck pain 05/05/2022   Hyperglycemia 05/05/2022   Disorder of vitamin B12 05/05/2022   Pulmonary hypertension, primary (HCC) 03/09/2021   Low TSH level 03/09/2021   Right knee pain 03/06/2021   Primary osteoarthritis of right knee 02/25/2021   Bilateral carotid artery stenosis 11/21/2020   Adnexal mass    Left foot drop 02/08/2020   A-V fistula (HCC) 08/03/2019   Nonrheumatic mitral valve regurgitation 06/12/2019   Edema 04/09/2019   Drug-induced constipation 10/18/2018   External prolapsed hemorrhoids 09/25/2018   Hyperphosphatemia 03/09/2018   Leg swelling 03/03/2018   Long term (current) use of anticoagulants  11/01/2017   Atrial fibrillation (HCC) 10/25/2017   Hyperparathyroidism, unspecified (HCC) 05/04/2017   DDD (degenerative disc disease), lumbar 04/07/2017   Hearing loss 01/04/2017   Hyperlipidemia 01/04/2017   Arthritis of fingers of both hands 10/01/2016   Chronic rhinitis 10/01/2016   Nonrheumatic aortic valve insufficiency 09/29/2016   Bruit 09/29/2016   Palpitation 09/22/2016   Aortic valve disease 04/28/2016   Anemia in ESRD (end-stage renal disease) (HCC) 03/03/2016   Sun-damaged skin 01/21/2016   ESRD (end stage renal disease) (HCC) 11/09/2015   Chronic kidney disease, stage 5 (HCC) 07/03/2015   Advanced care  planning/counseling discussion 06/10/2014   Pedal edema 03/28/2014   Proteinuria 02/18/2014   Hypercalcemia 02/18/2014   Abnormal EKG 01/22/2014   HTN (hypertension) 01/22/2014   Dermatitis 06/30/2013   Abnormal thyroid function test 06/25/2013   Cervical cancer screening 06/10/2012   Preventative health care 11/09/2011   Multiple chemical sensitivity syndrome 06/25/2010   History of chicken pox 06/25/2010   Bladder polyps 06/25/2010   History of cardiac dysrhythmia 06/25/2010   Insomnia 06/24/2010   Fatigue 06/24/2010   Chronic headaches 06/24/2010   Allergy    Anemia    Depression     PCP: Reuel Derby, MD  REFERRING PROVIDER: Richardean Sale, DO  REFERRING DIAG: scapulothoracic bursitis L  Rationale for Evaluation and Treatment: Rehabilitation  THERAPY DIAG:  Pain in thoracic spine  Abnormal posture  Muscle spasm of back  ONSET DATE: chronic  SUBJECTIVE:                                                                                                                                                                                           SUBJECTIVE STATEMENT: Feeling much better, last 2 dialysis treatments haven't hurt.  More sore now than hurting.   PERTINENT HISTORY:  Chronic L periscapular pain x 1 year after driving to visit family. Has tried chiropractic, injections, no relief.  Wants to try dry needling again.  Unable to tolerate sleeping or sitting in dialysis chair  PAIN:  Are you having pain? Yes: NPRS scale: 3/10 Pain location: L medial scapular border, now radiating into L lateral lower ribs Pain description: constant worse sitting soft chair and sleep Aggravating factors: see above Relieving factors: ice  PRECAUTIONS: Other: osteoporosis  RED FLAGS: None   WEIGHT BEARING RESTRICTIONS: No  FALLS:  Has patient fallen in last 6 months? No  LIVING ENVIRONMENT: Lives with: lives with their family Lives in: House/apartment Stairs: No Has  following equipment at home: None  OCCUPATION: retired  PLOF: Independent  PATIENT GOALS: sleep better   NEXT MD VISIT: 2 weeks  OBJECTIVE:  DIAGNOSTIC FINDINGS:  na  PATIENT SURVEYS:  Modified Oswestry 25/50   SCREENING FOR RED FLAGS:scapulothoracic bursitis L Bowel or bladder incontinence: No Spinal tumors: No Cauda equina syndrome: No Compression fracture: No Abdominal aneurysm: No  COGNITION: Overall cognitive status: Within functional limits for tasks assessed     SENSATION: WFL  POSTURE:  scoliosis, L shoulder elevated, slight rotation to R thoracic spine  PALPATION: "Sore" tender L lateral thoracic paraspinals, periscapular, supraspinatus PA segmental testing thoracic spine decreased mobility T 3, T 7. Lateral and post ribs with normal mobility, tender on L post lower ribs  LUMBAR ROM: NT Thoracic ROM wfl  all directions  Cervical spine wfl all directions Decreased movement noted with isolation of L 2st rib, seated cervical flex/SB   B shoulders wfl but R sholder with poor mechanics for final 15 degrees of movement MMT: R shoulder flex, abd 4/5 Otherwise symmetrical , wfl   GAIT: Distance walked: 31' in clnic, wnl  TODAY'S TREATMENT:                                                                                                                              DATE:   01/05/23  Therapeutic Exercise: to improve strength and mobility.  Demo, verbal and tactile cues throughout for technique. Rows 10# x 10 Rows RTB x 10  Shoulder extension RTB x 10 Lat pull downs x 20 10# Self Care: Education on TENS Manual Therapy: to decrease muscle spasm and pain and improve mobility STM/TPR to bil UT, L/S, rhomboids,thoracic erector spinae, cervical paraspinals, PA mobs thoracic spine grade 1-2   12/28/22 Therapeutic Exercise: to improve strength and mobility.  Demo, verbal and tactile cues throughout for technique. Rows with YTB x 15 Shoulder extension YTB x  15 Review of HEP - stretches, wall pushups  - corrected added to HEP Manual Therapy: to decrease muscle spasm and pain and improve mobility STM/TPR to bil UT, L/S, rhomboids,thoracic erector spinae, cervical paraspinals, PA mobs thoracic spine grade 1-2,, skilled palpation and monitoring during dry needling. Trigger Point Dry-Needling  Treatment instructions: Expect mild to moderate muscle soreness. S/S of pneumothorax if dry needled over a lung field, and to seek immediate medical attention should they occur. Patient verbalized understanding of these instructions and education. Patient Consent Given: Yes Education handout provided: Previously provided Muscles treated: bil UT, L/S, L Thoracic  multifidi T4-8, R infraspinatus Electrical stimulation performed: No Parameters: N/A Treatment response/outcome: Twitch Response Elicited and Palpable Increase in Muscle Length  12/22/22 Manual Therapy: to decrease muscle spasm and pain and improve mobility STM/TPR to bil UT, L/S, rhomboids,thoracic erector spinae, cervical paraspinals, PA mobs thoracic spine grade 1-2,, skilled palpation and monitoring during dry needling. Trigger Point Dry-Needling  Treatment instructions: Expect mild to moderate muscle soreness. S/S of pneumothorax if dry needled over a lung field, and to seek immediate medical attention should they occur. Patient verbalized understanding of these instructions and education. Patient Consent Given: Yes Education  handout provided: Previously provided Muscles treated: bil UT, L/S, L Thoracic  multifidi T4-8, R infraspinatus Electrical stimulation performed: No Parameters: N/A Treatment response/outcome: Twitch Response Elicited and Palpable Increase in Muscle Length Self Care: Education on TENS,  set up and trialed TENS to L rhomboids information on inexpensive unit  12/15/22  Manual Therapy: to decrease muscle spasm and pain and improve mobility STM/TPR to bil UT, L/S, rhomboids, L  subscap release, cervical paraspinals, UPA mobs cervical spine, PA mobs thoracic spine grade 1-2,, skilled palpation and monitoring during dry needling. Trigger Point Dry-Needling  Treatment instructions: Expect mild to moderate muscle soreness. S/S of pneumothorax if dry needled over a lung field, and to seek immediate medical attention should they occur. Patient verbalized understanding of these instructions and education. Patient Consent Given: Yes Education handout provided: Previously provided Muscles treated: bil UT, L/S, L C5-6 multifidi, bil T5/6 multifidi, L subscap, L rhomboids Electrical stimulation performed: No Parameters: N/A Treatment response/outcome: Twitch Response Elicited and Palpable Increase in Muscle Length  PATIENT EDUCATION:  Education details: HEP update Person educated: Patient Education method: Explanation, Demonstration, Verbal cues, and Handouts Education comprehension: verbalized understanding and returned demonstration  HOME EXERCISE PROGRAM: Access Code: [email or web address] URL: [email or web address] Date: 12/28/2022 Prepared by: Harrie Foreman  Exercises - Seated Quadratus Lumborum Stretch in Chair  - 1 x daily - 7 x weekly - 1 sets - 3 reps - Seated Trunk Rotation Stretch  - 1 x daily - 7 x weekly - 1 sets - 10 reps - Seated Shoulder Horizontal Abduction - Thumbs Up  - 1 x daily - 7 x weekly - 1 sets - 10 reps - Standing Thoracic Open Book at Wall  - 1 x daily - 7 x weekly - 1 sets - 10 reps - Standing shoulder flexion wall slides  - 1 x daily - 7 x weekly - 1 sets - 10 reps - Seated Shoulder Rolls  - 1 x daily - 7 x weekly - 1 sets - 10 reps - Shoulder extension with resistance - Neutral  - 1 x daily - 7 x weekly - 2 sets - 10 reps - Scapular Retraction with Resistance  - 1 x daily - 7 x weekly - 2 sets - 10 reps - Wall Push Up  - 1 x daily - 7 x weekly - 2 sets - 10 reps  ASSESSMENT:  CLINICAL IMPRESSION: TASHAYA ACEITUNO reports  significant improvement this week in pain.  She has started to use the TENS but had some concerns over the pad placement, reviewed the instructions with her.  Also reviewed exercises, issued green and red therabands to increase resistance at home as appropriate.  Finished with gentle manual therapy to upper back and neck to decrease muscle tension.  At this time she would like to be place on 30 day hold due to improved pain.    OBJECTIVE IMPAIRMENTS: decreased activity tolerance, decreased strength, increased fascial restrictions, impaired perceived functional ability, impaired UE functional use, and pain.   ACTIVITY LIMITATIONS: carrying, lifting, standing, sleeping, and reach over head  PARTICIPATION LIMITATIONS: meal prep  PERSONAL FACTORS: Age, Behavior pattern, Past/current experiences, Time since onset of injury/illness/exacerbation, and 3+ comorbidities: ESRD, osteoporosis,   are also affecting patient's functional outcome.   REHAB POTENTIAL: Fair    CLINICAL DECISION MAKING: Evolving/moderate complexity  EVALUATION COMPLEXITY: Moderate   GOALS: Goals reviewed with patient? Yes  SHORT TERM GOALS: Target date: 11/10/22  I HEP Baseline: Goal status: MET 11/03/22- given 11/17/22 compliant  LONG TERM GOALS: Target date: 12/03/22 extended to 01/26/23  Modified Oswestry improve to 15/50 Baseline: 25/50 Goal status: IN PROGRESS 12/28/22- no change.   2.  Strength B shoulder horizontal abd(middle traps, and lower traps 5/5) Baseline: TBD Goal status: MET 12/01/22  3.  Sleeping improve to greater than 2 hrs without pain L scapulothoracic region Baseline:  Goal status: IN PROGRESS 11/17/22- was sleeping better but worse last 2 days.  12/01/22- varies, some days sleeps well, other days has great difficulty sleeping.  12/28/22- no change 01/05/23- improved   PLAN:  PT FREQUENCY: 1x/week  PT DURATION: 8 weeks  PLANNED INTERVENTIONS: Therapeutic exercises, Therapeutic activity,  Neuromuscular re-education, Balance training, Gait training, Patient/Family education, Self Care, and Joint mobilization.  PLAN FOR NEXT SESSION: 30 day hold  Jena Gauss, PT, DPT  01/05/2023, 6:04 PM

## 2023-01-12 ENCOUNTER — Ambulatory Visit: Payer: Medicare HMO | Admitting: Sports Medicine

## 2023-01-12 ENCOUNTER — Encounter: Payer: Medicare HMO | Admitting: Physical Therapy

## 2023-01-18 NOTE — Progress Notes (Signed)
Aleen Sells D.[facility] [address] [place] [address] Phone: [phone]   Assessment and Plan:     1. Scapulothoracic bursitis of left shoulder 2. Scapular dyskinesis 3. Other idiopathic scoliosis, thoracic region 4. Somatic dysfunction of cervical region 5. Somatic dysfunction of thoracic region 6. Somatic dysfunction of rib region  -Chronic with exacerbation, subsequent visit - Overall improvement in upper back and scapular pain after completing physical therapy, continuing HEP, multiple OMT treatments - Patient completed physical therapy and may continue HEP - Can use Tylenol as needed for day-to-day pain relief - Patient has received relief with OMT in the past.  Elects for repeat OMT today.  Tolerated well per note below. - Decision today to treat with OMT was based on Physical Exam -Do not recommend OTC or prescription NSAIDs due to chronic anticoagulation on Eliquis  After verbal consent patient was treated with HVLA (high velocity low amplitude), ME (muscle energy), FPR (flex positional release), ST (soft tissue), techniques in cervical, rib, thoracic,   areas. Patient tolerated the procedure well with improvement in symptoms.  Patient educated on potential side effects of soreness and recommended to rest, hydrate, and use Tylenol as needed for pain control.   Pertinent previous records reviewed include none  Follow Up: As needed if no improvement or worsening of symptoms.  Could consider repeat OMT   Subjective:   I, Moenique Parris, am serving as a Neurosurgeon for Doctor Richardean Sale  Chief Complaint: left shoulder pain    HPI:    09/24/2022 Patient is a 77 year old female complaining of left shoulder pain. Patient states she sees Dr. Shon Baton and she has tried different types of injections to help with her shoulder blade on right side. The injection they either didn't help or didn't last at all. Patient was referred here to  see what the next steps are. Patient states the pain started a few months ago when driving to [place] and the pain was only when she would be doing daily activities but would go away with rest. The pain now is constant.    10/22/2022  Patient states continued left shoulder pain. Has been to PT and had kinesiology taping, that was somewhat helpful. Has also tried dry-needling, got short-term relief. Denies new or worsening sx since last visit, continued aching. OMT was helpful short-term for sx.    11/12/2022 Patient states that she is better    12/10/2022 Patient states here for an adjustment   01/19/2023 Patient states that she is doing much better   Relevant Historical Information: ESRD on dialysis, atrial fibrillation, history of DVT, on chronic anticoagulation with Eliquis    Additional pertinent review of systems negative.   Current Outpatient Medications:    acetaminophen (TYLENOL) 500 MG tablet, Take 500 mg by mouth every 6 (six) hours as needed for headache (pain)., Disp: , Rfl:    Alfalfa 500 MG TABS, Take 500 mg by mouth 3 (three) times daily., Disp: , Rfl:    apixaban (ELIQUIS) 5 MG TABS tablet, Take 1 tablet (5 mg total) by mouth 2 (two) times daily., Disp: 60 tablet, Rfl: 6   atorvastatin (LIPITOR) 20 MG tablet, Take 20 mg by mouth at bedtime., Disp: , Rfl:    carvedilol (COREG) 25 MG tablet, Take 1 tablet (25 mg total) by mouth 2 (two) times daily., Disp: 180 tablet, Rfl: 3   cinacalcet (SENSIPAR) 30 MG tablet, Take 30 mg by mouth at bedtime., Disp: , Rfl:  diltiazem (CARDIZEM CD) 240 MG 24 hr capsule, Take 240 mg by mouth daily with lunch., Disp: , Rfl:    hydrALAZINE (APRESOLINE) 50 MG tablet, Take 75 mg by mouth 2 (two) times daily., Disp: , Rfl:    L-Glutamine 500 MG CAPS, Take 500 mg by mouth every evening., Disp: , Rfl:    lidocaine-prilocaine (EMLA) cream, Apply 1 application  topically every Monday, Wednesday, and Friday with hemodialysis., Disp: , Rfl:    OVER  THE COUNTER MEDICATION, Take 3 capsules by mouth every evening. Shaklee Herb-Lax Natural Laxative for Colon Cleanse (Senna, Licorice, and Alfalfa), Disp: , Rfl:    sevelamer carbonate (RENVELA) 800 MG tablet, Take 2,400 mg by mouth See admin instructions. Take 3 tablets (2400 mg) by mouth with each meal & take 2 tablets (1600 mg) by mouth with each large snack, Disp: , Rfl:    vitamin C (ASCORBIC ACID) 500 MG tablet, Take 500 mg by mouth daily., Disp: , Rfl:    Objective:     Vitals:   01/19/23 1347  BP: 120/84  Pulse: 67  SpO2: 95%  Weight: 132 lb (59.9 kg)  Height: 5\' 6"  (1.676 m)      Body mass index is 21.31 kg/m.    Physical Exam:    General: Well-appearing, cooperative, sitting comfortably in no acute distress.      OMT Physical Exam:   Rib: Ribs 4-7 stuck in inhalation on left Thoracic: TTP paraspinal, T4-7 RLSR T1-3 RRSL.  Exaggerated kyphotic curvature. Left scapular winging.  Scapular muscle energy performed Neck: TTP paraspinal, c3-5 RRSR    Electronically signed by:  Aleen Sells D.[facility] 3:48 PM 01/19/23

## 2023-01-19 ENCOUNTER — Encounter: Payer: Medicare HMO | Admitting: Physical Therapy

## 2023-01-19 ENCOUNTER — Ambulatory Visit: Payer: Medicare HMO | Admitting: Sports Medicine

## 2023-01-19 VITALS — BP 120/84 | HR 67 | Ht 66.0 in | Wt 132.0 lb

## 2023-01-19 DIAGNOSIS — M9908 Segmental and somatic dysfunction of rib cage: Secondary | ICD-10-CM

## 2023-01-19 DIAGNOSIS — G2589 Other specified extrapyramidal and movement disorders: Secondary | ICD-10-CM | POA: Diagnosis not present

## 2023-01-19 DIAGNOSIS — M9902 Segmental and somatic dysfunction of thoracic region: Secondary | ICD-10-CM

## 2023-01-19 DIAGNOSIS — M4124 Other idiopathic scoliosis, thoracic region: Secondary | ICD-10-CM

## 2023-01-19 DIAGNOSIS — M7552 Bursitis of left shoulder: Secondary | ICD-10-CM | POA: Diagnosis not present

## 2023-01-19 DIAGNOSIS — M9901 Segmental and somatic dysfunction of cervical region: Secondary | ICD-10-CM | POA: Diagnosis not present

## 2023-01-19 NOTE — Patient Instructions (Signed)
Continue HEP Tylenol as needed Recommend warm hand baths As needed follow up

## 2023-01-25 DIAGNOSIS — C07 Malignant neoplasm of parotid gland: Secondary | ICD-10-CM | POA: Diagnosis not present

## 2023-01-26 ENCOUNTER — Telehealth: Payer: Self-pay

## 2023-01-26 NOTE — Telephone Encounter (Signed)
   Pre-operative Risk Assessment    Patient Name: Jillian Hayes  DOB: [birth year] MRN: [id number]   LOV: 10/22/22: DR. Antoine Poche  NOV: NONE    Request for Surgical Clearance    Procedure:   LT. PAROTIDECTOMY WITH FACIAL DISSECTION AND NECK DISSECTION   Date of Surgery:  Clearance TBD                                 Surgeon:  DR. Pollyann Kennedy Surgeon's Group or Practice Name:  [facility] [place] Phone number:  [phone] Fax number:  [phone]   Type of Clearance Requested:   - Pharmacy:  Hold Apixaban (Eliquis) NEEDS INSTRUCTIONS WHEN TO HOLD MEDICAL   Type of Anesthesia:  Not Indicated   Additional requests/questions:    SignedMichaelle Copas   01/26/2023, 11:10 AM

## 2023-01-28 NOTE — Telephone Encounter (Signed)
Patient with diagnosis of atrial fibrillation on Eliquis for anticoagulation.    Procedure:   LT. PAROTIDECTOMY WITH FACIAL DISSECTION AND NECK DISSECTION    Date of Surgery:  Clearance TBD    CHA2DS2-VASc Score = 4   This indicates a 4.8% annual risk of stroke. The patient's score is based upon: CHF History: 0 HTN History: 1 Diabetes History: 0 Stroke History: 0 Vascular Disease History: 0 Age Score: 2 Gender Score: 1   DVT in right internal jugular 08/07/2022, was not on anticoagulation at the time.    CrCl on hemodialysis Platelet count 202  January 5 will be 6 months since DVT.  Will verify with Dr. Antoine Poche if patient cleared to be off Eliquis for 2 days for procedure  **This guidance is not considered finalized until pre-operative APP has relayed final recommendations.**

## 2023-01-29 ENCOUNTER — Telehealth: Payer: Self-pay | Admitting: *Deleted

## 2023-01-29 NOTE — Telephone Encounter (Signed)
Pt has been scheduled tele preop appt 02/11/23. Pt states procedure is set for 02/26/23. I will update the clearance format to reflect the DOS 02/26/23. Med rec and consent are done.      Patient Consent for Virtual Visit        Jillian Hayes has provided verbal consent on 01/29/2023 for a virtual visit (video or telephone).   CONSENT FOR VIRTUAL VISIT FOR:  Jillian Hayes  By participating in this virtual visit I agree to the following:  I hereby voluntarily request, consent and authorize [facility] and its employed or contracted physicians, physician assistants, nurse practitioners or other licensed health care professionals (the Practitioner), to provide me with telemedicine health care services (the "Services") as deemed necessary by the treating Practitioner. I acknowledge and consent to receive the Services by the Practitioner via telemedicine. I understand that the telemedicine visit will involve communicating with the Practitioner through live audiovisual communication technology and the disclosure of certain medical information by electronic transmission. I acknowledge that I have been given the opportunity to request an in-person assessment or other available alternative prior to the telemedicine visit and am voluntarily participating in the telemedicine visit.  I understand that I have the right to withhold or withdraw my consent to the use of telemedicine in the course of my care at any time, without affecting my right to future care or treatment, and that the Practitioner or I may terminate the telemedicine visit at any time. I understand that I have the right to inspect all information obtained and/or recorded in the course of the telemedicine visit and may receive copies of available information for a reasonable fee.  I understand that some of the potential risks of receiving the Services via telemedicine include:  Delay or interruption in medical evaluation due to  technological equipment failure or disruption; Information transmitted may not be sufficient (e.g. poor resolution of images) to allow for appropriate medical decision making by the Practitioner; and/or  In rare instances, security protocols could fail, causing a breach of personal health information.  Furthermore, I acknowledge that it is my responsibility to provide information about my medical history, conditions and care that is complete and accurate to the best of my ability. I acknowledge that Practitioner's advice, recommendations, and/or decision may be based on factors not within their control, such as incomplete or inaccurate data provided by me or distortions of diagnostic images or specimens that may result from electronic transmissions. I understand that the practice of medicine is not an exact science and that Practitioner makes no warranties or guarantees regarding treatment outcomes. I acknowledge that a copy of this consent can be made available to me via my patient portal [facility] MyChart), or I can request a printed copy by calling the office of [facility].    I understand that my insurance will be billed for this visit.   I have read or had this consent read to me. I understand the contents of this consent, which adequately explains the benefits and risks of the Services being provided via telemedicine.  I have been provided ample opportunity to ask questions regarding this consent and the Services and have had my questions answered to my satisfaction. I give my informed consent for the services to be provided through the use of telemedicine in my medical care

## 2023-01-29 NOTE — Telephone Encounter (Signed)
   Name: Jillian Hayes  DOB: Jun 17, 1945  MRN: [id number]  Primary Cardiologist: Rollene Rotunda, MD  Preoperative team, please contact this patient and set up a phone call appointment for further preoperative risk assessment. Please obtain consent and complete medication review. Thank you for your help.  I confirm that guidance regarding antiplatelet and oral anticoagulation therapy has been completed and, if necessary, noted below.  Patient with history of DVT in right internal jugular on 08/07/22 while not on anticoagulation, February 07, 2023 will be six months since DVT. Per Dr. Antoine Poche patient can hold Eliquis for two days prior to procedure after February 07, 2023.   I also confirmed the patient resides in the state of [name]. As per [facility] Board telemedicine laws, the patient must reside in the state in which the provider is licensed.  Rip Harbour, NP 01/29/2023, 9:43 AM [facility]

## 2023-01-29 NOTE — Telephone Encounter (Signed)
Pt has been scheduled tele preop appt 02/11/23. Pt states procedure is set for 02/26/23. I will update the clearance format to reflect the DOS 02/26/23. Med rec and consent are done.

## 2023-02-01 NOTE — Telephone Encounter (Signed)
REQUESTING OFFICE SENT DUPLICATE REQUEST.   Clearance request is in process. Pt has televisit for preop clearance 02/11/23. Once the pt has been cleared the clearance notes will be faxed to requesting office with recommendations if any.

## 2023-02-02 DIAGNOSIS — N186 End stage renal disease: Secondary | ICD-10-CM | POA: Diagnosis not present

## 2023-02-02 DIAGNOSIS — Z992 Dependence on renal dialysis: Secondary | ICD-10-CM | POA: Diagnosis not present

## 2023-02-04 ENCOUNTER — Encounter: Payer: Self-pay | Admitting: Family

## 2023-02-05 DIAGNOSIS — N2581 Secondary hyperparathyroidism of renal origin: Secondary | ICD-10-CM | POA: Diagnosis not present

## 2023-02-05 DIAGNOSIS — N186 End stage renal disease: Secondary | ICD-10-CM | POA: Diagnosis not present

## 2023-02-05 DIAGNOSIS — D509 Iron deficiency anemia, unspecified: Secondary | ICD-10-CM | POA: Diagnosis not present

## 2023-02-05 DIAGNOSIS — D631 Anemia in chronic kidney disease: Secondary | ICD-10-CM | POA: Diagnosis not present

## 2023-02-11 ENCOUNTER — Ambulatory Visit: Payer: HMO | Attending: Cardiology | Admitting: Student

## 2023-02-11 DIAGNOSIS — Z01810 Encounter for preprocedural cardiovascular examination: Secondary | ICD-10-CM

## 2023-02-11 NOTE — Progress Notes (Signed)
 Virtual Visit via Telephone Note   Because of Jillian Hayes co-morbid illnesses, she is at least at moderate risk for complications without adequate follow up.  This format is felt to be most appropriate for this patient at this time.  The patient did not have access to video technology/had technical difficulties with video requiring transitioning to audio format only (telephone).  All issues noted in this document were discussed and addressed.  No physical exam could be performed with this format.  Please refer to the patient's chart for her consent to telehealth for [facility].  Evaluation Performed:  Preoperative cardiovascular risk assessment _____________   Date:  02/11/2023   Patient ID:  Jillian Hayes, DOB 05-Aug-1945, MRN [id number] Patient Location:  Home Provider location:   Office  Primary Care Provider:  Domenica Harlene LABOR, MD Primary Cardiologist:  Lynwood Schilling, MD  Chief Complaint / Patient Profile   78 y.o. y/o female with a h/o PAF on anticoagulation, pulmonary hypertension, carotid artery disease, DVT right axillary vein, hypertension, hyperlipidemia, ESRD who is pending left parotidectomy with facial dissection and neck dissection by Dr. Jesus on 02/26/2023 and presents today for telephonic preoperative cardiovascular risk assessment.  History of Present Illness    Jillian Hayes is a 78 y.o. female who presents via audio/video conferencing for a telehealth visit today.  Pt was last seen in cardiology clinic on 10/22/2022 by Dr. Schilling.  At that time Jillian Hayes was stable from a cardiac standpoint.  The patient is now pending procedure as outlined above. Since her last visit, she is doing well. Patient denies shortness of breath, dyspnea on exertion, lower extremity edema, orthopnea or PND. No chest pain, pressure, or tightness. No palpitations.  She stays active walking at least 1 mile daily and strength training with light weights every day.   Past  Medical History    Past Medical History:  Diagnosis Date   Advanced care planning/counseling discussion 06/10/2014   05/31/2014 patient presents copy of HCP and Living Will   Anemia    iron deficiency   Anxiety    Arthritis    in hands   Atrial fibrillation (HCC)    Benign fundic gland polyps of stomach    Bladder polyps 06/25/2010   Chronic headaches 06/24/2010   Complication of anesthesia    woke up at the end of a cyst removal surgery in 1991   Depression 1991   hospitalized   Diverticulosis    DVT of right axillary vein, acute (HCC) 08/07/2022   ESRD (end stage renal disease) (HCC) 11/09/2015   HD on MWF   External prolapsed hemorrhoids    History of chicken pox 06/25/2010   Hypercalcemia 02/18/2014   Hypertension    Insomnia 06/24/2010   Multiple chemical sensitivity syndrome 06/25/2010   Proteinuria 02/18/2014   Renal insufficiency 03/26/2011   Valvular heart disease 04/28/2016   Past Surgical History:  Procedure Laterality Date   AV FISTULA PLACEMENT Left    x4   COLONOSCOPY  2014   cyst on left breast removed Left 1991   benign   ESOPHAGOGASTRODUODENOSCOPY (EGD) WITH ESOPHAGEAL DILATION  2014   LAPAROSCOPIC BILATERAL SALPINGO OOPHERECTOMY Bilateral 08/20/2020   Procedure: LAPAROSCOPIC BILATERAL SALPINGO OOPHORECTOMY;  Surgeon: Cleotilde Ronal GORMAN, MD;  Location: [facility] OR;  Service: Gynecology;  Laterality: Bilateral;   NASAL SEPTUM SURGERY  1986   rhinoplasty   polyps on bladder removed  1972   benign   RIGHT HEART CATH N/A 04/10/2021  Procedure: RIGHT HEART CATH;  Surgeon: Rolan Ezra RAMAN, MD;  Location: [facility] INVASIVE CV LAB;  Service: Cardiovascular;  Laterality: N/A;   TONSILLECTOMY  1962    Allergies  Allergies  Allergen Reactions   Clonidine  Derivatives Palpitations   Sulfa Antibiotics Nausea And Vomiting    Home Medications    Prior to Admission medications   Medication Sig Start Date End Date Taking? Authorizing Provider  acetaminophen  (TYLENOL )  500 MG tablet Take 500 mg by mouth every 6 (six) hours as needed for headache (pain).    [provider]  Alfalfa 500 MG TABS Take 500 mg by mouth 3 (three) times daily.    [provider]  apixaban  (ELIQUIS ) 5 MG TABS tablet Take 1 tablet (5 mg total) by mouth 2 (two) times daily. 08/07/22   Timmy Maude SAUNDERS, MD  atorvastatin (LIPITOR) 20 MG tablet Take 20 mg by mouth at bedtime. Patient not taking: Reported on 01/29/2023    [provider]  carvedilol  (COREG ) 25 MG tablet Take 1 tablet (25 mg total) by mouth 2 (two) times daily. 10/22/22   Lavona Agent, MD  cinacalcet  (SENSIPAR ) 30 MG tablet Take 30 mg by mouth at bedtime.    [provider]  diltiazem  (CARDIZEM  CD) 240 MG 24 hr capsule Take 240 mg by mouth daily with lunch.    [provider]  hydrALAZINE  (APRESOLINE ) 50 MG tablet Take 75 mg by mouth 2 (two) times daily. 03/23/20   [provider]  L-Glutamine 500 MG CAPS Take 500 mg by mouth every evening.    [provider]  lidocaine -prilocaine (EMLA) cream Apply 1 application  topically every Monday, Wednesday, and Friday with hemodialysis. 03/16/20   [provider]  OVER THE COUNTER MEDICATION Take 3 capsules by mouth every evening. Shaklee Herb-Lax Natural Laxative for Colon Cleanse (Senna, Licorice, and Alfalfa)    [provider]  sevelamer carbonate (RENVELA) 800 MG tablet Take 2,400 mg by mouth See admin instructions. Take 3 tablets (2400 mg) by mouth with each meal & take 2 tablets (1600 mg) by mouth with each large snack Patient not taking: Reported on 01/29/2023 09/07/18   [provider]  vitamin C (ASCORBIC ACID) 500 MG tablet Take 500 mg by mouth daily.    [provider]    Physical Exam    Vital Signs:  Jillian Hayes does not have vital signs available for review today.  Given telephonic nature of communication, physical exam is limited. AAOx3. NAD. Normal affect.  Speech and  respirations are unlabored.  Assessment & Plan    Primary Cardiologist: Agent Lavona, MD  Preoperative cardiovascular risk assessment.  Left parotidectomy with facial dissection and neck dissection by Dr. Jesus on 02/26/2023.  Chart reviewed as part of pre-operative protocol coverage. According to the RCRI, patient has a 0.9% risk of MACE. Patient reports activity equivalent to 4.0 METS (walks at least a mile daily, strength trains with light weights daily).   Given past medical history and time since last visit, based on ACC/AHA guidelines, Jillian Hayes would be at acceptable risk for the planned procedure without further cardiovascular testing.   Patient was advised that if she develops new symptoms prior to surgery to contact our office to arrange a follow-up appointment.  she verbalized understanding.  Per Pharm D and Dr. Lavona, patient may hold Eliquis  for 2 days prior to procedure.    I will route this recommendation to the requesting party via Epic fax function.  Please call with questions.  Time:   Today, I have spent 6 minutes with the patient with telehealth technology discussing medical history, symptoms, and management plan.     Jillian Hila, NP  02/11/2023, 7:42 AM

## 2023-02-14 NOTE — H&P (Signed)
 HPI:   Jillian Hayes is a 78 y.o. female who presents as a consult Patient.   Referring Provider: No ref. provider found  Chief complaint: Parotid cancer.  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 She was experiencing numbness of the left ear. It has not really changed. She has not had any paralysis or paresis of the facial nerve. She does take hemodialysis 3 days each week. She is on Eliquis  for atrial fibrillation.  PMH/Meds/All/SocHx/FamHx/ROS:   Past Medical History:  Diagnosis Date  A-fib (CMD)  Anemia  Anesthesia complication  woke up during breast cyst surgery in 1991  Arthritis  hands  Carotid stenosis, bilateral  Chronic kidney disease (CKD), stage V (CMD) 08/10/2018  Added automatically from request for surgery [id number]  ESRD (end stage renal disease) (CMD) 12/09/2018  MWF at [facility]; Dr Carlette  Fluid collection of middle ear  drained 04/08  Hyperparathyroidism, unspecified (CMD) 05/04/2017  Hypertension  Proteinuria  Renal insufficiency  Uses hearing aid   Past Surgical History:  Procedure Laterality Date  AV FISTULA PLACEMENT Left 08/18/2018  Procedure: A-V FISTULA; Surgeon: Franky JENEANE Chihuahua, MD; Location: [facility] OUTPATIENT OR; Service: Vascular; Laterality: Left; COVID test sched for 08/11/18; pt aware, Pt aware to hold coumadin  3 days prior per Dr. Chihuahua  AV FISTULA PLACEMENT Left 05/25/2019  Procedure: left brachiocephalic fistula; Surgeon: Franky JENEANE Chihuahua, MD; Location: [facility] MAIN OR; Service: Vascular; Laterality: Left;  AV FISTULA REPAIR Left 06/19/2022  REVISION AV FISTULA performed by Franky JENEANE Chihuahua, MD at [facility] OR  BREAST SURGERY  Procedure: BREAST SURGERY; benign cyst 1991  CATARACT EXTRACTION Bilateral  Procedure: CATARACT EXTRACTION  COLONOSCOPY  Procedure: COLONOSCOPY  CYST REMOVAL 1991  Procedure: CYST REMOVAL; LEFT BREAST  DIALYSIS CATHETER INSERTION Right 12/19/2018  Procedure:  DIALYSIS CATHETER INSERTION; perm cath IR  ESOPHAGOGASTRODUODENOSCOPY  Procedure: ESOPHAGOGASTRODUODENOSCOPY  INSERT / REPLACE / VENOUS ACCESS CATHETER Right  Procedure: INSERT / REPLACE / VENOUS ACCESS CATHETER; Permcath for dialysis  INVASIVE VASCULAR PROCEDURE N/A 06/05/2022  Fistulagram arterial venous performed by Franky JENEANE Chihuahua, MD at [facility] OR  NASAL SEPTUM SURGERY (AKA DEVIATED SEPTUM) 1986  Procedure: NASAL SEPTUM SURGERY; RHINOPLASTY  OTHER SURGICAL HISTORY 1972  Procedure: OTHER SURGICAL HISTORY (BLADDER POLYPS REMOVED)  TONSILLECTOMY 1962  Procedure: TONSILLECTOMY  TYMPANOSTOMY TUBE PLACEMENT Left 10/14/2021  Procedure: TYMPANOSTOMY TUBE PLACEMENT   No family history of bleeding disorders, wound healing problems or difficulty with anesthesia.     Current Outpatient Medications:  alfalfa 650 mg tab, Take 6 capsules by mouth 3 (three) times a day., Disp: , Rfl:  apixaban  (ELIQUIS ) 5 mg tab, Take 1 tablet by mouth 2 (two) times a day., Disp: , Rfl:  ascorbic acid (VITAMIN C) 500 mg tablet, Take 500 mg by mouth Once Daily., Disp: , Rfl:  atorvastatin (LIPITOR) 20 mg tablet, Take 1 tablet by mouth once daily, Disp: 90 tablet, Rfl: 1 b complex vitamins cap capsule, Take 1 capsule by mouth Once Daily., Disp: , Rfl:  cinacalcet  (SENSIPAR ) 30 mg tablet, Take 60 mg by mouth nightly Indications: ESRD (WITH DIALYSIS)., Disp: , Rfl:  dilTIAZem  (CARDIZEM  CD) 240 mg 24 hr capsule, Take 1 capsule by mouth once daily, Disp: 90 capsule, Rfl: 3 elderberry fruit (SAMBUCUS ELDERBERRY ORIGINAL ORAL), Take 1 capsule by mouth daily. chewable, Disp: , Rfl:  glutamine (L-Glutamine) 500 mg tab, Take 1 capsule by mouth nightly., Disp: , Rfl:  hydrALAZINE  (APRESOLINE ) 50 mg tablet,  Take 1.5 tablets (75 mg total) by mouth 2 (two) times a day., Disp: 90 tablet, Rfl: 11 sevelamer carbonate (RENVELA) 800 mg tablet, TAKE 2 TABLETS (1600 MG) BY MOUTH 3 TIMES DAILY (Patient taking differently: Takes 3 tablets with  breakfast, dinner and 2 tablets with snack), Disp: 540 tablet, Rfl: 1 carvediloL  (COREG ) 25 mg tablet, Take 1 tablet (25 mg total) by mouth 2 (two) times a day., Disp: 60 tablet, Rfl: 0 warfarin (COUMADIN ) 5 mg tablet, Take 6 mg by mouth on Tues and Thurs, then 5 mg by mouth the rest of the week (Patient not taking: Reported on 01/25/2023), Disp: , Rfl:   A complete ROS was performed with pertinent positives/negatives noted in the HPI. The remainder of the ROS are negative.   Physical Exam:   BP (!) 213/77  Pulse 65  Temp 98 F (36.7 C) (Temporal)  Ht 1.676 m (5' 6)  Wt 59 kg (130 lb)  LMP (LMP Unknown)  BMI 20.98 kg/m   General: Healthy and alert, in no distress, breathing easily. Normal affect. In a pleasant mood. Head: Normocephalic, atraumatic. No masses, or scars. Eyes: Pupils are equal, and reactive to light. Vision is grossly intact. No spontaneous or gaze nystagmus. Ears: Ear canals are clear. Tympanic membranes are intact, with normal landmarks and the middle ears are clear and healthy. Hearing: Grossly normal. Nose: Nasal cavities are clear with healthy mucosa, no polyps or exudate. Airways are patent. Face: No masses or scars, facial nerve function is symmetric. Oral Cavity: No mucosal abnormalities are noted. Tongue with normal mobility. Dentition appears healthy. Oropharynx: Tonsils are symmetric. There are no mucosal masses identified. Tongue base appears normal and healthy. Larynx/Hypopharynx: deferred Chest: Deferred Neck: There is a 2.5 cm firm mass involving the inferior portion of the left parotid, otherwise no palpable masses, no cervical adenopathy, no thyroid  nodules or enlargement. Neuro: Cranial nerves II-XII with normal function. Balance: Normal gate. Other findings: none.  Independent Review of Additional Tests or Records:  none  Procedures:  none  Impression & Plans:  Left parotid mass, adenoid cystic carcinoma on FNA. No palpable adenopathy.  Recommend parotidectomy with facial nerve dissection. Given the malignant nature, it is more likely that there may be involvement of facial nerve branches. We discussed the possibility of permanent or temporary paralysis. We also discussed performing a level 2 neck dissection to sample the local lymph nodes which will be adjacent to this tumor. She will need to be in the hospital for couple of nights. She may need radiation postop. All questions were answered.

## 2023-02-16 ENCOUNTER — Other Ambulatory Visit: Payer: Self-pay | Admitting: Hematology & Oncology

## 2023-02-16 NOTE — Patient Outreach (Signed)
  Care Coordination   Case Closure  Visit Note   02/16/2023 Name: Jillian Hayes MRN: [id number] DOB: October 16, 1945  Jillian Hayes is a 78 y.o. year old female who sees Domenica Harlene LABOR, MD for primary care. No patient contact was made during this encounter  What matters to the patients health and wellness today?  Documentation encounter only-Per care guide note dated 09/30/2022-We have been unable to make contact with the patient for follow up. Goals closed/Case closed.  Goals Addressed             This Visit's Progress    COMPLETED: Continue to improve post hospitalization       Interventions Today    Flowsheet Row Most Recent Value  General Interventions   General Interventions Discussed/Reviewed General Interventions Reviewed  [unable to make contact with the patient for follow up-case closed]            SDOH assessments and interventions completed:  No  Care Coordination Interventions:  No, not indicated   Follow up plan: No further intervention required.   Encounter Outcome:  Patient Visit Completed   Heddy Shutter, RN, MSN, BSN, CCM RN Care Manager [phone]

## 2023-02-23 ENCOUNTER — Encounter: Payer: Self-pay | Admitting: Family

## 2023-02-26 ENCOUNTER — Inpatient Hospital Stay (HOSPITAL_COMMUNITY): Admission: RE | Admit: 2023-02-26 | Payer: Self-pay | Source: Home / Self Care | Admitting: Otolaryngology

## 2023-02-26 ENCOUNTER — Encounter (HOSPITAL_COMMUNITY): Admission: RE | Payer: Self-pay | Source: Home / Self Care

## 2023-02-26 SURGERY — EXCISION, PAROTID GLAND
Anesthesia: General | Laterality: Left

## 2023-02-27 NOTE — Assessment & Plan Note (Deleted)
monitor

## 2023-02-27 NOTE — Assessment & Plan Note (Deleted)
Patient has dialysis several times a week. Will request labs from nephrology to monitor

## 2023-02-27 NOTE — Assessment & Plan Note (Deleted)
Encouraged good sleep hygiene such as dark, quiet room. No blue/green glowing lights such as computer screens in bedroom. No alcohol or stimulants in evening. Cut down on caffeine as able. Regular exercise is helpful but not just prior to bed time.

## 2023-02-27 NOTE — Assessment & Plan Note (Deleted)
hgba1c acceptable, minimize simple carbs. Increase exercise as tolerated.

## 2023-02-27 NOTE — Assessment & Plan Note (Deleted)
Well controlled, no changes to meds. Encouraged heart healthy diet such as the DASH diet and exercise as tolerated.

## 2023-02-27 NOTE — Assessment & Plan Note (Deleted)
Tolerating meds, rate controlled

## 2023-02-27 NOTE — Assessment & Plan Note (Deleted)
Encourage heart healthy diet such as MIND or DASH diet, increase exercise, avoid trans fats, simple carbohydrates and processed foods, consider a krill or fish or flaxseed oil cap daily.

## 2023-03-01 ENCOUNTER — Ambulatory Visit: Payer: Medicare HMO | Admitting: Family Medicine

## 2023-03-01 DIAGNOSIS — I480 Paroxysmal atrial fibrillation: Secondary | ICD-10-CM

## 2023-03-01 DIAGNOSIS — E213 Hyperparathyroidism, unspecified: Secondary | ICD-10-CM

## 2023-03-01 DIAGNOSIS — E782 Mixed hyperlipidemia: Secondary | ICD-10-CM

## 2023-03-01 DIAGNOSIS — I10 Essential (primary) hypertension: Secondary | ICD-10-CM

## 2023-03-01 DIAGNOSIS — D631 Anemia in chronic kidney disease: Secondary | ICD-10-CM

## 2023-03-01 DIAGNOSIS — E538 Deficiency of other specified B group vitamins: Secondary | ICD-10-CM

## 2023-03-01 DIAGNOSIS — G4700 Insomnia, unspecified: Secondary | ICD-10-CM

## 2023-03-01 DIAGNOSIS — R739 Hyperglycemia, unspecified: Secondary | ICD-10-CM

## 2023-03-05 DIAGNOSIS — N186 End stage renal disease: Secondary | ICD-10-CM | POA: Diagnosis not present

## 2023-03-05 DIAGNOSIS — Z992 Dependence on renal dialysis: Secondary | ICD-10-CM | POA: Diagnosis not present

## 2023-03-08 DIAGNOSIS — N2581 Secondary hyperparathyroidism of renal origin: Secondary | ICD-10-CM | POA: Diagnosis not present

## 2023-03-08 DIAGNOSIS — N186 End stage renal disease: Secondary | ICD-10-CM | POA: Diagnosis not present

## 2023-03-08 DIAGNOSIS — D509 Iron deficiency anemia, unspecified: Secondary | ICD-10-CM | POA: Diagnosis not present

## 2023-03-10 LAB — LAB REPORT - SCANNED
Calcium: 8.6
HM Hepatitis Screen: NEGATIVE
PTH: 246

## 2023-03-15 NOTE — Assessment & Plan Note (Signed)
 Well controlled, no changes to meds. Encouraged heart healthy diet such as the DASH diet and exercise as tolerated.

## 2023-03-15 NOTE — Assessment & Plan Note (Signed)
 Following with nephrology and on dialysis

## 2023-03-15 NOTE — Assessment & Plan Note (Signed)
 Encourage heart healthy diet such as MIND or DASH diet, increase exercise, avoid trans fats, simple carbohydrates and processed foods, consider a krill or fish or flaxseed oil cap daily.

## 2023-03-15 NOTE — Assessment & Plan Note (Signed)
 hgba1c acceptable, minimize simple carbs. Increase exercise as tolerated.

## 2023-03-16 ENCOUNTER — Encounter: Payer: Self-pay | Admitting: Family Medicine

## 2023-03-16 ENCOUNTER — Telehealth: Payer: HMO | Admitting: Family Medicine

## 2023-03-16 VITALS — BP 149/66 | HR 75 | Ht 66.0 in | Wt 132.0 lb

## 2023-03-16 DIAGNOSIS — E782 Mixed hyperlipidemia: Secondary | ICD-10-CM | POA: Diagnosis not present

## 2023-03-16 DIAGNOSIS — I10 Essential (primary) hypertension: Secondary | ICD-10-CM | POA: Diagnosis not present

## 2023-03-16 DIAGNOSIS — R739 Hyperglycemia, unspecified: Secondary | ICD-10-CM | POA: Diagnosis not present

## 2023-03-16 DIAGNOSIS — D631 Anemia in chronic kidney disease: Secondary | ICD-10-CM

## 2023-03-16 DIAGNOSIS — N186 End stage renal disease: Secondary | ICD-10-CM | POA: Diagnosis not present

## 2023-03-16 NOTE — Progress Notes (Signed)
MyChart Video Visit    Virtual Visit via Video Note   This patient is at least at moderate risk for complications without adequate follow up. This format is felt to be most appropriate for this patient at this time. Physical exam was limited by quality of the video and audio technology used for the visit. Juanetta, CMA was able to get the patient set up on a video visit.  Patient location: home Patient and provider in visit Provider location: Office  I discussed the limitations of evaluation and management by telemedicine and the availability of in person appointments. The patient expressed understanding and agreed to proceed.  Visit Date: 03/16/2023  Today's healthcare provider: Danise Edge, MD     Subjective:    Patient ID: Jillian Hayes, female    DOB: 1945-04-17, 78 y.o.   MRN: [id number]  Chief Complaint  Patient presents with   Follow-up    HPI Discussed the use of AI scribe software for clinical note transcription with the patient, who gave verbal consent to proceed.  History of Present Illness   The patient, with a history of end-stage renal disease on dialysis and a recently diagnosed tumor under the ear, presents with fatigue and insomnia. She reports feeling 'tired all the time' and attributes this to insomnia and the physical toll of dialysis. She also expresses concern about the proposed treatment plan for the tumor, which includes surgery and radiation, due to the potential side effects including facial paralysis and the need for a drainage tube. The patient's daughter researched alternative treatments and she is now following a three-month protocol developed by a [place] oncologist and Programmer, multimedia.  The patient also reports back pain, which contributes to her insomnia. She has tried various remedies including exercises, lidocaine, and warm milk. She expresses frustration with her inability to sleep, stating that she often gets only four hours of broken  sleep at night.  In addition to these issues, the patient is dealing with low hemoglobin and high ferritin levels, which are contributing to her fatigue. She was previously on EPOETIN to help produce more red blood cells, but this was discontinued due to concerns about it promoting cancer cell growth. The patient is considering a blood transfusion if her hemoglobin levels drop too low.        Past Medical History:  Diagnosis Date   Advanced care planning/counseling discussion 06/10/2014   05/31/2014 patient presents copy of HCP and Living Will   Anemia    iron deficiency   Anxiety    Arthritis    "in hands"   Atrial fibrillation (HCC)    Benign fundic gland polyps of stomach    Bladder polyps 06/25/2010   Chronic headaches 06/24/2010   Complication of anesthesia    "woke up at the end of a cyst removal surgery in 1991"   Depression 1991   hospitalized   Diverticulosis    DVT of right axillary vein, acute (HCC) 08/07/2022   ESRD (end stage renal disease) (HCC) 11/09/2015   HD on MWF   External prolapsed hemorrhoids    History of chicken pox 06/25/2010   Hypercalcemia 02/18/2014   Hypertension    Insomnia 06/24/2010   Multiple chemical sensitivity syndrome 06/25/2010   Proteinuria 02/18/2014   Renal insufficiency 03/26/2011   Valvular heart disease 04/28/2016    Past Surgical History:  Procedure Laterality Date   AV FISTULA PLACEMENT Left    x4   COLONOSCOPY  2014   cyst on left  breast removed Left 1991   benign   ESOPHAGOGASTRODUODENOSCOPY (EGD) WITH ESOPHAGEAL DILATION  2014   LAPAROSCOPIC BILATERAL SALPINGO OOPHERECTOMY Bilateral 08/20/2020   Procedure: LAPAROSCOPIC BILATERAL SALPINGO OOPHORECTOMY;  Surgeon: Jerene Bears, MD;  Location: [facility];  Service: Gynecology;  Laterality: Bilateral;   NASAL SEPTUM SURGERY  1986   rhinoplasty   polyps on bladder removed  1972   benign   RIGHT HEART CATH N/A 04/10/2021   Procedure: RIGHT HEART CATH;  Surgeon: Laurey Morale,  MD;  Location: [facility] INVASIVE CV LAB;  Service: Cardiovascular;  Laterality: N/A;   TONSILLECTOMY  1962    Family History  Problem Relation Age of Onset   Breast cancer Mother 70       left breast removed, 2010 lung   Diabetes Mother    Nephrolithiasis Father    Heart attack Father 46       X 3   Heart disease Father        smoker   Hypertension Brother    Allergic rhinitis Brother    Melanoma Son 37       melanoma on leg removed   Pancreatic cancer Maternal Grandmother    Hypertension Paternal Grandmother    Obesity Paternal Grandmother    Heart attack Paternal Grandfather 40   Diabetes Maternal Aunt        x 2   Diabetes Maternal Uncle        x 3   Asthma Neg Hx    Eczema Neg Hx    Urticaria Neg Hx    Immunodeficiency Neg Hx    Angioedema Neg Hx     Social History   Socioeconomic History   Marital status: Married    Spouse name: Not on file   Number of children: 2   Years of education: Not on file   Highest education level: Not on file  Occupational History   Occupation: retired  Tobacco Use   Smoking status: Never   Smokeless tobacco: Never  Vaping Use   Vaping status: Never Used  Substance and Sexual Activity   Alcohol use: No   Drug use: No   Sexual activity: Yes    Partners: Male  Other Topics Concern   Not on file  Social History Narrative   Married and lives with husband.     Retired   1 son and 1 daughter   No alcohol tobacco or caffeine   Social Drivers of Corporate investment banker Strain: Low Risk  (01/30/2021)   Overall Financial Resource Strain (CARDIA)    Difficulty of Paying Living Expenses: Not hard at all  Food Insecurity: No Food Insecurity (07/07/2022)   Hunger Vital Sign    Worried About Running Out of Food in the Last Year: Never true    Ran Out of Food in the Last Year: Never true  Transportation Needs: No Transportation Needs (07/07/2022)   PRAPARE - Administrator, Civil Service (Medical): No    Lack of  Transportation (Non-Medical): No  Physical Activity: Insufficiently Active (01/30/2021)   Exercise Vital Sign    Days of Exercise per Week: 7 days    Minutes of Exercise per Session: 20 min  Stress: No Stress Concern Present (01/30/2021)   [employer] of Occupational Health - Occupational Stress Questionnaire    Feeling of Stress : Not at all  Social Connections: Moderately Integrated (01/30/2021)   Social Connection and Isolation Panel [NHANES]    Frequency of Communication with  Friends and Family: More than three times a week    Frequency of Social Gatherings with Friends and Family: More than three times a week    Attends Religious Services: More than 4 times per year    Active Member of [employer] or Organizations: No    Attends Banker Meetings: Never    Marital Status: Married  Catering manager Violence: Not At Risk (05/05/2022)   Humiliation, Afraid, Rape, and Kick questionnaire    Fear of Current or Ex-Partner: No    Emotionally Abused: No    Physically Abused: No    Sexually Abused: No    Outpatient Medications Prior to Visit  Medication Sig Dispense Refill   acetaminophen (TYLENOL) 500 MG tablet Take 500 mg by mouth every 6 (six) hours as needed for headache (pain).     Alfalfa 500 MG TABS Take 500 mg by mouth 3 (three) times daily.     atorvastatin (LIPITOR) 20 MG tablet Take 20 mg by mouth at bedtime.     carvedilol (COREG) 25 MG tablet Take 1 tablet (25 mg total) by mouth 2 (two) times daily. 180 tablet 3   cinacalcet (SENSIPAR) 30 MG tablet Take 30 mg by mouth at bedtime.     diltiazem (CARDIZEM CD) 240 MG 24 hr capsule Take 240 mg by mouth daily with lunch.     ELIQUIS 5 MG TABS tablet Take 1 tablet by mouth twice daily 60 tablet 0   hydrALAZINE (APRESOLINE) 50 MG tablet Take 75 mg by mouth 2 (two) times daily.     L-Glutamine 500 MG CAPS Take 500 mg by mouth every evening.     lidocaine-prilocaine (EMLA) cream Apply 1 application  topically every Monday,  Wednesday, and Friday with hemodialysis.     OVER THE COUNTER MEDICATION Take 3 capsules by mouth every evening. Shaklee Herb-Lax Natural Laxative for Colon Cleanse (Senna, Licorice, and Alfalfa)     sevelamer carbonate (RENVELA) 800 MG tablet Take 2,400 mg by mouth See admin instructions. Take 3 tablets (2400 mg) by mouth with each meal & take 2 tablets (1600 mg) by mouth with each large snack     vitamin C (ASCORBIC ACID) 500 MG tablet Take 500 mg by mouth daily.     No facility-administered medications prior to visit.    Allergies  Allergen Reactions   Clonidine Derivatives Palpitations   Sulfa Antibiotics Nausea And Vomiting    Review of Systems  Constitutional:  Positive for malaise/fatigue. Negative for fever.  HENT:  Negative for congestion.   Eyes:  Negative for blurred vision.  Respiratory:  Negative for shortness of breath.   Cardiovascular:  Negative for chest pain, palpitations and leg swelling.  Gastrointestinal:  Negative for abdominal pain, blood in stool and nausea.  Genitourinary:  Negative for dysuria and frequency.  Musculoskeletal:  Negative for falls.  Skin:  Negative for rash.  Neurological:  Negative for dizziness, loss of consciousness and headaches.  Endo/Heme/Allergies:  Negative for environmental allergies.  Psychiatric/Behavioral:  Negative for depression. The patient is nervous/anxious and has insomnia.        Objective:    Physical Exam Constitutional:      General: She is not in acute distress.    Appearance: Normal appearance. She is not ill-appearing or toxic-appearing.  HENT:     Head: Normocephalic and atraumatic.     Right Ear: External ear normal.     Left Ear: External ear normal.     Nose: Nose normal.  Eyes:  General:        Right eye: No discharge.        Left eye: No discharge.  Pulmonary:     Effort: Pulmonary effort is normal.  Skin:    Findings: No rash.  Neurological:     Mental Status: She is alert and oriented to  person, place, and time.  Psychiatric:        Behavior: Behavior normal.     BP (!) 149/66   Pulse 75   Ht 5\' 6"  (1.676 m) Comment: Pt stated  Wt 132 lb (59.9 kg) Comment: Pt stated  BMI 21.31 kg/m  Wt Readings from Last 3 Encounters:  03/16/23 132 lb (59.9 kg)  01/19/23 132 lb (59.9 kg)  12/10/22 132 lb 9.6 oz (60.1 kg)       Assessment & Plan:  ESRD (end stage renal disease) (HCC) Assessment & Plan: Following with nephrology and on dialysis   Hypertension, unspecified type Assessment & Plan: Well controlled, no changes to meds. Encouraged heart healthy diet such as the DASH diet and exercise as tolerated.    Hyperglycemia Assessment & Plan: hgba1c acceptable, minimize simple carbs. Increase exercise as tolerated.    Mixed hyperlipidemia Assessment & Plan: Encourage heart healthy diet such as MIND or DASH diet, increase exercise, avoid trans fats, simple carbohydrates and processed foods, consider a krill or fish or flaxseed oil cap daily.    Anemia in ESRD (end-stage renal disease) (HCC) -     Iron, TIBC and Ferritin Panel; Future -     CBC with Differential/Platelet; Future     Assessment and Plan    Head and Neck Cancer Patient declined surgery and radiation due to concerns about side effects and impact on quality of life. Opted for alternative treatment protocol under the guidance of Dr. Golden Hurter, a [facility] oncologist and radiologist. - Patient to provide details of the alternative treatment protocol for review and documentation.  End Stage Renal Disease Patient on dialysis with associated fatigue. Erythropoietin (EPO) therapy discontinued due to potential for promoting cancer cell growth. Current low hemoglobin contributing to fatigue, but high ferritin levels limit iron supplementation. - Obtain recent lab results from dialysis center. - Consider oral iron supplementation pending review of recent iron panel. - Consider blood transfusion if  hemoglobin levels decrease significantly.  Insomnia Chronic back pain and other factors contributing to poor sleep, exacerbating fatigue. Patient declined pharmacological sleep aids due to potential side effects. - Trial of over-the-counter supplements (Magnesium Glycinate 200mg  at bedtime and L-Tryptophan at manufacturer's recommended dose) suggested to aid sleep. - Consider light therapy with a Helight device.  Follow-up - Lab visit on Thursday for iron panel and complete blood count. - Virtual visit in 2-3 months. - In-person visit 2-3 months after the virtual visit.         I discussed the assessment and treatment plan with the patient. The patient was provided an opportunity to ask questions and all were answered. The patient agreed with the plan and demonstrated an understanding of the instructions.   The patient was advised to call back or seek an in-person evaluation if the symptoms worsen or if the condition fails to improve as anticipated.  Danise Edge, MD [facility] at [facility] [phone] (phone) [phone] (fax)  [facility]

## 2023-03-17 DIAGNOSIS — Z114 Encounter for screening for human immunodeficiency virus [HIV]: Secondary | ICD-10-CM | POA: Diagnosis not present

## 2023-03-17 DIAGNOSIS — Z1159 Encounter for screening for other viral diseases: Secondary | ICD-10-CM | POA: Diagnosis not present

## 2023-03-18 ENCOUNTER — Other Ambulatory Visit (INDEPENDENT_AMBULATORY_CARE_PROVIDER_SITE_OTHER): Payer: HMO

## 2023-03-18 DIAGNOSIS — N186 End stage renal disease: Secondary | ICD-10-CM

## 2023-03-18 DIAGNOSIS — D631 Anemia in chronic kidney disease: Secondary | ICD-10-CM

## 2023-03-19 ENCOUNTER — Telehealth: Payer: Self-pay | Admitting: Family Medicine

## 2023-03-19 ENCOUNTER — Encounter: Payer: Self-pay | Admitting: Family Medicine

## 2023-03-19 LAB — IBC + FERRITIN
Ferritin: 1106.1 ng/mL — ABNORMAL HIGH (ref 10.0–291.0)
Iron: 203 ug/dL — ABNORMAL HIGH (ref 42–145)
Saturation Ratios: 96.7 % — ABNORMAL HIGH (ref 20.0–50.0)
TIBC: 210 ug/dL — ABNORMAL LOW (ref 250.0–450.0)
Transferrin: 150 mg/dL — ABNORMAL LOW (ref 212.0–360.0)

## 2023-03-19 LAB — CBC WITH DIFFERENTIAL/PLATELET
Basophils Absolute: 0.1 10*3/uL (ref 0.0–0.1)
Basophils Relative: 1.2 % (ref 0.0–3.0)
Eosinophils Absolute: 0.2 10*3/uL (ref 0.0–0.7)
Eosinophils Relative: 2.9 % (ref 0.0–5.0)
HCT: 27.2 % — ABNORMAL LOW (ref 36.0–46.0)
Hemoglobin: 9.1 g/dL — ABNORMAL LOW (ref 12.0–15.0)
Lymphocytes Relative: 22.4 % (ref 12.0–46.0)
Lymphs Abs: 1.3 10*3/uL (ref 0.7–4.0)
MCHC: 33.3 g/dL (ref 30.0–36.0)
MCV: 96.6 fL (ref 78.0–100.0)
Monocytes Absolute: 0.6 10*3/uL (ref 0.1–1.0)
Monocytes Relative: 10.5 % (ref 3.0–12.0)
Neutro Abs: 3.6 10*3/uL (ref 1.4–7.7)
Neutrophils Relative %: 63 % (ref 43.0–77.0)
Platelets: 241 10*3/uL (ref 150.0–400.0)
RBC: 2.81 Mil/uL — ABNORMAL LOW (ref 3.87–5.11)
RDW: 16.6 % — ABNORMAL HIGH (ref 11.5–15.5)
WBC: 5.7 10*3/uL (ref 4.0–10.5)

## 2023-03-19 NOTE — Telephone Encounter (Signed)
Copied from CRM [id number]. Topic: Medicare AWV >> Mar 19, 2023 10:32 AM Payton Doughty wrote: Reason for CRM: Called LVM 03/19/2023 to schedule AWV. Please schedule Virtual or Telehealth visits ONLY.   Verlee Rossetti; Care Guide Ambulatory Clinical Support [facility] Direct Dial: [phone]

## 2023-03-22 ENCOUNTER — Encounter: Payer: Self-pay | Admitting: *Deleted

## 2023-03-23 ENCOUNTER — Ambulatory Visit: Payer: Medicare HMO | Admitting: Family Medicine

## 2023-04-02 DIAGNOSIS — N186 End stage renal disease: Secondary | ICD-10-CM | POA: Diagnosis not present

## 2023-04-02 DIAGNOSIS — Z992 Dependence on renal dialysis: Secondary | ICD-10-CM | POA: Diagnosis not present

## 2023-04-04 ENCOUNTER — Other Ambulatory Visit: Payer: Self-pay | Admitting: Hematology & Oncology

## 2023-04-05 ENCOUNTER — Encounter: Payer: Self-pay | Admitting: Family

## 2023-04-05 DIAGNOSIS — D509 Iron deficiency anemia, unspecified: Secondary | ICD-10-CM | POA: Diagnosis not present

## 2023-04-05 DIAGNOSIS — D631 Anemia in chronic kidney disease: Secondary | ICD-10-CM | POA: Diagnosis not present

## 2023-04-05 DIAGNOSIS — N2581 Secondary hyperparathyroidism of renal origin: Secondary | ICD-10-CM | POA: Diagnosis not present

## 2023-04-05 DIAGNOSIS — N186 End stage renal disease: Secondary | ICD-10-CM | POA: Diagnosis not present

## 2023-04-09 ENCOUNTER — Other Ambulatory Visit: Payer: Self-pay | Admitting: Hematology & Oncology

## 2023-04-12 ENCOUNTER — Telehealth: Payer: Self-pay | Admitting: Hematology & Oncology

## 2023-04-12 NOTE — Telephone Encounter (Signed)
 Called pt to schedule a follow up appointment per in basket.    Per Dr Ennever10/17/2024 note:   Return in about 8 weeks (around 01/14/2023) for md labs, appt.   Thank you! dph

## 2023-04-15 ENCOUNTER — Encounter: Payer: Self-pay | Admitting: Family

## 2023-04-16 ENCOUNTER — Encounter: Payer: Self-pay | Admitting: *Deleted

## 2023-04-16 ENCOUNTER — Telehealth: Payer: Self-pay | Admitting: *Deleted

## 2023-04-16 NOTE — Telephone Encounter (Signed)
 Patient called stating that dialysis was not wanting to give Epogen in light of her cancer diagnosis.  Dr Myna Hidalgo spoke with Brett Canales the clinical manager in Dialysis.  Ok for patient to receive Epogen in light of her cancer dialysis.  Letter written stating this and faxed to dialysis.

## 2023-04-23 ENCOUNTER — Inpatient Hospital Stay

## 2023-04-23 ENCOUNTER — Ambulatory Visit: Admitting: Hematology & Oncology

## 2023-04-29 ENCOUNTER — Encounter: Payer: Self-pay | Admitting: Hematology & Oncology

## 2023-04-29 ENCOUNTER — Inpatient Hospital Stay: Attending: Hematology & Oncology

## 2023-04-29 ENCOUNTER — Other Ambulatory Visit: Payer: Self-pay | Admitting: *Deleted

## 2023-04-29 ENCOUNTER — Inpatient Hospital Stay: Payer: Self-pay | Admitting: Hematology & Oncology

## 2023-04-29 VITALS — BP 173/54 | HR 66 | Temp 98.5°F | Resp 20 | Ht 66.0 in | Wt 131.0 lb

## 2023-04-29 DIAGNOSIS — C7989 Secondary malignant neoplasm of other specified sites: Secondary | ICD-10-CM | POA: Diagnosis not present

## 2023-04-29 DIAGNOSIS — N186 End stage renal disease: Secondary | ICD-10-CM

## 2023-04-29 DIAGNOSIS — D631 Anemia in chronic kidney disease: Secondary | ICD-10-CM

## 2023-04-29 DIAGNOSIS — Z86718 Personal history of other venous thrombosis and embolism: Secondary | ICD-10-CM | POA: Insufficient documentation

## 2023-04-29 DIAGNOSIS — D649 Anemia, unspecified: Secondary | ICD-10-CM

## 2023-04-29 DIAGNOSIS — Z992 Dependence on renal dialysis: Secondary | ICD-10-CM | POA: Diagnosis not present

## 2023-04-29 DIAGNOSIS — I82A11 Acute embolism and thrombosis of right axillary vein: Secondary | ICD-10-CM

## 2023-04-29 DIAGNOSIS — R5383 Other fatigue: Secondary | ICD-10-CM | POA: Insufficient documentation

## 2023-04-29 DIAGNOSIS — C07 Malignant neoplasm of parotid gland: Secondary | ICD-10-CM | POA: Diagnosis not present

## 2023-04-29 LAB — CMP (CANCER CENTER ONLY)
ALT: 11 U/L (ref 0–44)
AST: 18 U/L (ref 15–41)
Albumin: 4 g/dL (ref 3.5–5.0)
Alkaline Phosphatase: 81 U/L (ref 38–126)
Anion gap: 8 (ref 5–15)
BUN: 26 mg/dL — ABNORMAL HIGH (ref 8–23)
CO2: 30 mmol/L (ref 22–32)
Calcium: 8.8 mg/dL — ABNORMAL LOW (ref 8.9–10.3)
Chloride: 101 mmol/L (ref 98–111)
Creatinine: 4.87 mg/dL — ABNORMAL HIGH (ref 0.44–1.00)
GFR, Estimated: 9 mL/min — ABNORMAL LOW (ref >60)
Glucose, Bld: 99 mg/dL (ref 70–99)
Potassium: 5.2 mmol/L — ABNORMAL HIGH (ref 3.5–5.1)
Sodium: 139 mmol/L (ref 135–145)
Total Bilirubin: 0.4 mg/dL (ref 0.0–1.2)
Total Protein: 5.6 g/dL — ABNORMAL LOW (ref 6.5–8.1)

## 2023-04-29 LAB — CBC WITH DIFFERENTIAL (CANCER CENTER ONLY)
Abs Immature Granulocytes: 0.02 10*3/uL (ref 0.00–0.07)
Basophils Absolute: 0 10*3/uL (ref 0.0–0.1)
Basophils Relative: 1 %
Eosinophils Absolute: 0.1 10*3/uL (ref 0.0–0.5)
Eosinophils Relative: 2 %
HCT: 25.9 % — ABNORMAL LOW (ref 36.0–46.0)
Hemoglobin: 8.1 g/dL — ABNORMAL LOW (ref 12.0–15.0)
Immature Granulocytes: 0 %
Lymphocytes Relative: 31 %
Lymphs Abs: 1.7 10*3/uL (ref 0.7–4.0)
MCH: 33.5 pg (ref 26.0–34.0)
MCHC: 31.3 g/dL (ref 30.0–36.0)
MCV: 107 fL — ABNORMAL HIGH (ref 80.0–100.0)
Monocytes Absolute: 0.7 10*3/uL (ref 0.1–1.0)
Monocytes Relative: 13 %
Neutro Abs: 2.8 10*3/uL (ref 1.7–7.7)
Neutrophils Relative %: 53 %
Platelet Count: 193 10*3/uL (ref 150–400)
RBC: 2.42 MIL/uL — ABNORMAL LOW (ref 3.87–5.11)
RDW: 20.1 % — ABNORMAL HIGH (ref 11.5–15.5)
WBC Count: 5.4 10*3/uL (ref 4.0–10.5)
nRBC: 0 % (ref 0.0–0.2)

## 2023-04-29 NOTE — Progress Notes (Signed)
 Hematology and Oncology Follow Up Visit  MARYELIZABETH EBERLE [id number] 21-Oct-1945 78 y.o. 04/29/2023   Principle Diagnosis:  Thromboembolic disease of the right jugular vein   Current Therapy:   Eliquis 5 mg p.o. twice daily     Interim History:  Ms. Boschert is back for follow-up.  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  She ultimately had a biopsy that was done back in November 2024.  This was of the left parotid gland.  The pathology report ([id number]) showed adenoid cystic carcinoma with perineural invasion.    It was recommended that she have surgery for this followed by radiation therapy.  She was not too excited about that option.  As such, she is now doing a holistic approach.  She got the protocol from a physician in [place] who apparently has had a lot of success treating cancer.  The other issue with her is that she is on dialysis.  She is quite anemic.  She had been on ESA.  However this had been stopped.  I thought that she could probably restart it.  However in our office today her blood pressure was incredibly high.  The last thing that she needs is a stroke.  We will hold off on the ESA.  I will let her dialysis center know to hold off on giving her any ESA until further notice.  I do feel bad for her.  She feels tired all the time.  She has no energy.  Her hemoglobin is 8.1.  I think she is going to have to be transfused with 2 units of packed red blood cells.  I talked to her about this.  I said that because of her high blood pressure that we really cannot safely use ESA.  As such, a transfusion is the best way to try to help her feel better.  I think another issue might be the fact that she is on dialysis.  I do not know if she is able to hold onto these holistic agents that she is to take for her cancer.  I do not know if these might also be causing her to have the fatigue.  I just want her quality of  life to be a little better.  She really does not have a good quality of life not right now because she does feel so tired.  Currently, I would say that her performance status is probably ECOG 2.   Medications:  Current Outpatient Medications:    acetaminophen (TYLENOL) 500 MG tablet, Take 500 mg by mouth every 6 (six) hours as needed for headache (pain)., Disp: , Rfl:    Alfalfa 500 MG TABS, Take 500 mg by mouth daily., Disp: , Rfl:    apixaban (ELIQUIS) 5 MG TABS tablet, Take 1 tablet by mouth twice daily, Disp: 28 tablet, Rfl: 0   atorvastatin (LIPITOR) 20 MG tablet, Take 20 mg by mouth at bedtime., Disp: , Rfl:    carvedilol (COREG) 25 MG tablet, Take 1 tablet (25 mg total) by mouth 2 (two) times daily., Disp: 180 tablet, Rfl: 3   cinacalcet (SENSIPAR) 30 MG tablet, Take 30 mg by mouth at bedtime., Disp: , Rfl:    diltiazem (CARDIZEM CD) 240 MG 24 hr capsule, Take 240 mg by mouth daily with lunch., Disp: , Rfl:    hydrALAZINE (APRESOLINE) 100 MG tablet, Take 100 mg by mouth 2 (two) times daily., Disp: ,  Rfl:    Iron Sucrose (VENOFER IV), Inject into the vein. 50mg  every other Wednesday, Disp: , Rfl:    L-Glutamine 500 MG CAPS, Take 500 mg by mouth every evening., Disp: , Rfl:    lidocaine-prilocaine (EMLA) cream, Apply 1 application  topically every Monday, Wednesday, and Friday with hemodialysis., Disp: , Rfl:    OVER THE COUNTER MEDICATION, Take 3 capsules by mouth every evening. Shaklee Herb-Lax Natural Laxative for Colon Cleanse (Senna, Licorice, and Alfalfa), Disp: , Rfl:    paricalcitol (ZEMPLAR) 2 MCG capsule, Take 2 mcg by mouth. Every treatment., Disp: , Rfl:    sevelamer carbonate (RENVELA) 800 MG tablet, Take 2,400 mg by mouth See admin instructions. Take 3 tablets (2400 mg) by mouth with each meal & take 2 tablets (1600 mg) by mouth with each large snack, Disp: , Rfl:    vitamin C (ASCORBIC ACID) 500 MG tablet, Take 500 mg by mouth daily., Disp: , Rfl:   Allergies:  Allergies   Allergen Reactions   Clonidine Derivatives Palpitations   Sulfa Antibiotics Nausea And Vomiting    Past Medical History, Surgical history, Social history, and Family History were reviewed and updated.  Review of Systems: Review of Systems  Constitutional: Negative.   HENT:  Negative.    Eyes: Negative.   Respiratory: Negative.    Cardiovascular: Negative.   Gastrointestinal: Negative.   Endocrine: Negative.   Genitourinary: Negative.    Musculoskeletal: Negative.   Skin: Negative.   Neurological: Negative.   Hematological: Negative.   Psychiatric/Behavioral:  Positive for sleep disturbance.     Physical Exam:  height is 5\' 6"  (1.676 m) and weight is 131 lb (59.4 kg). Her oral temperature is 98.5 F (36.9 C). Her blood pressure is 173/54 (abnormal) and her pulse is 66. Her respiration is 20 and oxygen saturation is 99%.   Wt Readings from Last 3 Encounters:  04/29/23 131 lb (59.4 kg)  03/16/23 132 lb (59.9 kg)  01/19/23 132 lb (59.9 kg)    Physical Exam Vitals reviewed.  HENT:     Head: Normocephalic and atraumatic.  Eyes:     Pupils: Pupils are equal, round, and reactive to light.  Cardiovascular:     Rate and Rhythm: Normal rate and regular rhythm.     Heart sounds: Normal heart sounds.  Pulmonary:     Effort: Pulmonary effort is normal.     Breath sounds: Normal breath sounds.  Abdominal:     General: Bowel sounds are normal.     Palpations: Abdomen is soft.  Musculoskeletal:        General: No tenderness or deformity. Normal range of motion.     Cervical back: Normal range of motion.  Lymphadenopathy:     Cervical: No cervical adenopathy.  Skin:    General: Skin is warm and dry.     Findings: No erythema or rash.  Neurological:     Mental Status: She is alert and oriented to person, place, and time.  Psychiatric:        Behavior: Behavior normal.        Thought Content: Thought content normal.        Judgment: Judgment normal.    Lab Results   Component Value Date   WBC 5.4 04/29/2023   HGB 8.1 (L) 04/29/2023   HCT 25.9 (L) 04/29/2023   MCV 107.0 (H) 04/29/2023   PLT 193 04/29/2023     Chemistry      Component Value Date/Time   NA  139 04/29/2023 1442   NA 145 (H) 04/01/2021 1608   NA 140 03/25/2016 1111   K 5.2 (H) 04/29/2023 1442   K 5.9 No visable hemolysis (H) 03/25/2016 1111   CL 101 04/29/2023 1442   CO2 30 04/29/2023 1442   CO2 23 03/25/2016 1111   BUN 26 (H) 04/29/2023 1442   BUN 35 (H) 04/01/2021 1608   BUN 51.8 (H) 03/25/2016 1111   CREATININE 4.87 (H) 04/29/2023 1442   CREATININE 2.4 (H) 03/25/2016 1111   GLU 0 12/06/2019 0000      Component Value Date/Time   CALCIUM 8.8 (L) 04/29/2023 1442   CALCIUM 8.6 03/10/2023 0937   CALCIUM 10.5 (H) 03/25/2016 1111   ALKPHOS 81 04/29/2023 1442   ALKPHOS 73 03/25/2016 1111   AST 18 04/29/2023 1442   AST 26 03/25/2016 1111   ALT 11 04/29/2023 1442   ALT 19 03/25/2016 1111   BILITOT 0.4 04/29/2023 1442   BILITOT 0.35 03/25/2016 1111      Impression and Plan: Ms. Mcgilvray is a very nice 78 year old white female.  She developed a thrombus in the right internal jugular vein.  This was back in early July.  She will come back early next week to have the transfusion.  She is going to think about surgery.  I know that this is a very difficult and very personal decision for her.  I will totally respect what she feels is best for herself.  I know this is incredibly complicated.  Having the dialysis certainly is not helping the situation.  Again is hard to know how the holistic agents that she is taking are being metabolized with her on dialysis.  Again, we will not give her any ESA because of her blood pressure.  Again I do not want her to have any issues with CVA.  She actually said that she has had some numbness with the left side of her face.  This actually is in her oral cavity.  She said this is getting better.  Again I had to follow her closely.  I probably  will have to have her come back to see [place] in another few weeks.   Josph Macho, MD 3/27/20253:27 PM

## 2023-04-30 ENCOUNTER — Telehealth: Payer: Self-pay | Admitting: *Deleted

## 2023-04-30 NOTE — Telephone Encounter (Signed)
 Call placed to [facility] at [facility] to notify him per order of Dr. Myna Hidalgo to hold Epogen today at dialysis d/t pt.'s elevated BP.  Brett Canales states that the dialysis center has a policy regarding Epogen and will hold Epogen if systolic BP is greater than 200.  Dr. Myna Hidalgo notified.

## 2023-05-03 ENCOUNTER — Inpatient Hospital Stay

## 2023-05-03 ENCOUNTER — Other Ambulatory Visit: Payer: Self-pay | Admitting: *Deleted

## 2023-05-03 DIAGNOSIS — C07 Malignant neoplasm of parotid gland: Secondary | ICD-10-CM | POA: Diagnosis not present

## 2023-05-03 DIAGNOSIS — D649 Anemia, unspecified: Secondary | ICD-10-CM

## 2023-05-03 DIAGNOSIS — Z992 Dependence on renal dialysis: Secondary | ICD-10-CM | POA: Diagnosis not present

## 2023-05-03 DIAGNOSIS — N186 End stage renal disease: Secondary | ICD-10-CM | POA: Diagnosis not present

## 2023-05-03 DIAGNOSIS — D631 Anemia in chronic kidney disease: Secondary | ICD-10-CM

## 2023-05-03 DIAGNOSIS — Z7901 Long term (current) use of anticoagulants: Secondary | ICD-10-CM

## 2023-05-03 DIAGNOSIS — I82A11 Acute embolism and thrombosis of right axillary vein: Secondary | ICD-10-CM

## 2023-05-03 LAB — CBC WITH DIFFERENTIAL (CANCER CENTER ONLY)
Abs Immature Granulocytes: 0.04 10*3/uL (ref 0.00–0.07)
Basophils Absolute: 0 10*3/uL (ref 0.0–0.1)
Basophils Relative: 0 %
Eosinophils Absolute: 0.1 10*3/uL (ref 0.0–0.5)
Eosinophils Relative: 1 %
HCT: 25.4 % — ABNORMAL LOW (ref 36.0–46.0)
Hemoglobin: 7.8 g/dL — ABNORMAL LOW (ref 12.0–15.0)
Immature Granulocytes: 1 %
Lymphocytes Relative: 16 %
Lymphs Abs: 1.1 10*3/uL (ref 0.7–4.0)
MCH: 33.6 pg (ref 26.0–34.0)
MCHC: 30.7 g/dL (ref 30.0–36.0)
MCV: 109.5 fL — ABNORMAL HIGH (ref 80.0–100.0)
Monocytes Absolute: 0.7 10*3/uL (ref 0.1–1.0)
Monocytes Relative: 10 %
Neutro Abs: 5.3 10*3/uL (ref 1.7–7.7)
Neutrophils Relative %: 72 %
Platelet Count: 205 10*3/uL (ref 150–400)
RBC: 2.32 MIL/uL — ABNORMAL LOW (ref 3.87–5.11)
RDW: 19.9 % — ABNORMAL HIGH (ref 11.5–15.5)
WBC Count: 7.4 10*3/uL (ref 4.0–10.5)
nRBC: 0 % (ref 0.0–0.2)

## 2023-05-03 LAB — IRON AND IRON BINDING CAPACITY (CC-WL,HP ONLY)
Iron: 38 ug/dL (ref 28–170)
Saturation Ratios: 14 % (ref 10.4–31.8)
TIBC: 279 ug/dL (ref 250–450)
UIBC: 241 ug/dL (ref 148–442)

## 2023-05-03 LAB — PREPARE RBC (CROSSMATCH)

## 2023-05-03 LAB — ABO/RH: ABO/RH(D): O POS

## 2023-05-03 LAB — RETICULOCYTES
Immature Retic Fract: 15.6 % (ref 2.3–15.9)
RBC.: 2.33 MIL/uL — ABNORMAL LOW (ref 3.87–5.11)
Retic Count, Absolute: 126.5 10*3/uL (ref 19.0–186.0)
Retic Ct Pct: 5.4 % — ABNORMAL HIGH (ref 0.4–3.1)

## 2023-05-03 LAB — FERRITIN: Ferritin: 1062 ng/mL — ABNORMAL HIGH (ref 11–307)

## 2023-05-04 ENCOUNTER — Inpatient Hospital Stay

## 2023-05-04 ENCOUNTER — Inpatient Hospital Stay: Attending: Hematology & Oncology

## 2023-05-04 DIAGNOSIS — D631 Anemia in chronic kidney disease: Secondary | ICD-10-CM | POA: Diagnosis not present

## 2023-05-04 DIAGNOSIS — I82A11 Acute embolism and thrombosis of right axillary vein: Secondary | ICD-10-CM

## 2023-05-04 DIAGNOSIS — Z86718 Personal history of other venous thrombosis and embolism: Secondary | ICD-10-CM | POA: Insufficient documentation

## 2023-05-04 DIAGNOSIS — C07 Malignant neoplasm of parotid gland: Secondary | ICD-10-CM | POA: Diagnosis not present

## 2023-05-04 DIAGNOSIS — Z7901 Long term (current) use of anticoagulants: Secondary | ICD-10-CM

## 2023-05-04 DIAGNOSIS — N186 End stage renal disease: Secondary | ICD-10-CM | POA: Insufficient documentation

## 2023-05-04 DIAGNOSIS — D649 Anemia, unspecified: Secondary | ICD-10-CM

## 2023-05-04 DIAGNOSIS — Z992 Dependence on renal dialysis: Secondary | ICD-10-CM | POA: Insufficient documentation

## 2023-05-04 MED ORDER — SODIUM CHLORIDE 0.9% IV SOLUTION
250.0000 mL | INTRAVENOUS | Status: DC
  Administered 2023-05-04: 250 mL via INTRAVENOUS

## 2023-05-04 MED ORDER — FUROSEMIDE 10 MG/ML IJ SOLN
20.0000 mg | Freq: Once | INTRAMUSCULAR | Status: AC
  Administered 2023-05-04: 20 mg via INTRAVENOUS
  Filled 2023-05-04: qty 4

## 2023-05-04 MED ORDER — DIPHENHYDRAMINE HCL 25 MG PO CAPS
25.0000 mg | ORAL_CAPSULE | Freq: Once | ORAL | Status: AC
Start: 2023-05-04 — End: 2023-05-04
  Administered 2023-05-04: 25 mg via ORAL
  Filled 2023-05-04: qty 1

## 2023-05-04 MED ORDER — ACETAMINOPHEN 325 MG PO TABS
650.0000 mg | ORAL_TABLET | Freq: Once | ORAL | Status: AC
Start: 2023-05-04 — End: 2023-05-04
  Administered 2023-05-04: 650 mg via ORAL
  Filled 2023-05-04: qty 2

## 2023-05-04 NOTE — Patient Instructions (Signed)

## 2023-05-05 DIAGNOSIS — N186 End stage renal disease: Secondary | ICD-10-CM | POA: Diagnosis not present

## 2023-05-05 DIAGNOSIS — D631 Anemia in chronic kidney disease: Secondary | ICD-10-CM | POA: Diagnosis not present

## 2023-05-05 DIAGNOSIS — N2581 Secondary hyperparathyroidism of renal origin: Secondary | ICD-10-CM | POA: Diagnosis not present

## 2023-05-05 DIAGNOSIS — D509 Iron deficiency anemia, unspecified: Secondary | ICD-10-CM | POA: Diagnosis not present

## 2023-05-05 LAB — TYPE AND SCREEN
ABO/RH(D): O POS
Antibody Screen: NEGATIVE
Unit division: 0
Unit division: 0

## 2023-05-05 LAB — BPAM RBC
Blood Product Expiration Date: 202504292359
Blood Product Unit Number: 202504292359
ISSUE DATE / TIME: 202504010749
PRODUCT CODE: 202504010749
PRODUCT CODE: 202504292359
Unit Type and Rh: 5100
Unit Type and Rh: 202504292359
Unit Type and Rh: 5100
Unit Type and Rh: 5100

## 2023-05-11 ENCOUNTER — Encounter: Payer: Self-pay | Admitting: Family

## 2023-05-13 ENCOUNTER — Other Ambulatory Visit: Payer: Self-pay | Admitting: Hematology & Oncology

## 2023-05-13 DIAGNOSIS — C07 Malignant neoplasm of parotid gland: Secondary | ICD-10-CM

## 2023-05-15 ENCOUNTER — Encounter (HOSPITAL_BASED_OUTPATIENT_CLINIC_OR_DEPARTMENT_OTHER): Payer: Self-pay

## 2023-05-15 ENCOUNTER — Other Ambulatory Visit: Payer: Self-pay

## 2023-05-15 ENCOUNTER — Emergency Department (HOSPITAL_COMMUNITY)

## 2023-05-15 ENCOUNTER — Emergency Department (HOSPITAL_BASED_OUTPATIENT_CLINIC_OR_DEPARTMENT_OTHER)
Admission: EM | Admit: 2023-05-15 | Discharge: 2023-05-15 | Disposition: A | Discharge status: Home / Self Care | Attending: Emergency Medicine | Admitting: Emergency Medicine

## 2023-05-15 ENCOUNTER — Emergency Department (HOSPITAL_BASED_OUTPATIENT_CLINIC_OR_DEPARTMENT_OTHER)

## 2023-05-15 DIAGNOSIS — R42 Dizziness and giddiness: Secondary | ICD-10-CM | POA: Insufficient documentation

## 2023-05-15 DIAGNOSIS — I120 Hypertensive chronic kidney disease with stage 5 chronic kidney disease or end stage renal disease: Secondary | ICD-10-CM | POA: Diagnosis not present

## 2023-05-15 DIAGNOSIS — Z79899 Other long term (current) drug therapy: Secondary | ICD-10-CM | POA: Insufficient documentation

## 2023-05-15 DIAGNOSIS — R791 Abnormal coagulation profile: Secondary | ICD-10-CM | POA: Diagnosis not present

## 2023-05-15 DIAGNOSIS — R29810 Facial weakness: Secondary | ICD-10-CM | POA: Insufficient documentation

## 2023-05-15 DIAGNOSIS — R200 Anesthesia of skin: Secondary | ICD-10-CM | POA: Diagnosis not present

## 2023-05-15 DIAGNOSIS — Z992 Dependence on renal dialysis: Secondary | ICD-10-CM | POA: Insufficient documentation

## 2023-05-15 DIAGNOSIS — R29898 Other symptoms and signs involving the musculoskeletal system: Secondary | ICD-10-CM | POA: Diagnosis not present

## 2023-05-15 DIAGNOSIS — R531 Weakness: Secondary | ICD-10-CM | POA: Diagnosis not present

## 2023-05-15 DIAGNOSIS — I6782 Cerebral ischemia: Secondary | ICD-10-CM | POA: Diagnosis not present

## 2023-05-15 DIAGNOSIS — Z859 Personal history of malignant neoplasm, unspecified: Secondary | ICD-10-CM | POA: Diagnosis not present

## 2023-05-15 DIAGNOSIS — Z7901 Long term (current) use of anticoagulants: Secondary | ICD-10-CM | POA: Insufficient documentation

## 2023-05-15 DIAGNOSIS — Z85858 Personal history of malignant neoplasm of other endocrine glands: Secondary | ICD-10-CM | POA: Insufficient documentation

## 2023-05-15 DIAGNOSIS — R5383 Other fatigue: Secondary | ICD-10-CM | POA: Diagnosis not present

## 2023-05-15 DIAGNOSIS — R251 Tremor, unspecified: Secondary | ICD-10-CM | POA: Diagnosis not present

## 2023-05-15 DIAGNOSIS — N186 End stage renal disease: Secondary | ICD-10-CM | POA: Insufficient documentation

## 2023-05-15 DIAGNOSIS — R262 Difficulty in walking, not elsewhere classified: Secondary | ICD-10-CM | POA: Diagnosis not present

## 2023-05-15 DIAGNOSIS — R030 Elevated blood-pressure reading, without diagnosis of hypertension: Secondary | ICD-10-CM | POA: Diagnosis not present

## 2023-05-15 LAB — RAPID URINE DRUG SCREEN, HOSP PERFORMED
Amphetamines: NOT DETECTED
Barbiturates: NOT DETECTED
Benzodiazepines: NOT DETECTED
Cocaine: NOT DETECTED
Opiates: NOT DETECTED
Tetrahydrocannabinol: NOT DETECTED

## 2023-05-15 LAB — DIFFERENTIAL
Abs Immature Granulocytes: 0.02 10*3/uL (ref 0.00–0.07)
Basophils Absolute: 0 10*3/uL (ref 0.0–0.1)
Basophils Relative: 1 %
Eosinophils Absolute: 0 10*3/uL (ref 0.0–0.5)
Eosinophils Relative: 1 %
Immature Granulocytes: 0 %
Lymphocytes Relative: 29 %
Lymphs Abs: 1.3 10*3/uL (ref 0.7–4.0)
Monocytes Absolute: 0.6 10*3/uL (ref 0.1–1.0)
Monocytes Relative: 12 %
Neutro Abs: 2.6 10*3/uL (ref 1.7–7.7)
Neutrophils Relative %: 57 %

## 2023-05-15 LAB — COMPREHENSIVE METABOLIC PANEL WITH GFR
ALT: 13 U/L (ref 0–44)
AST: 20 U/L (ref 15–41)
Albumin: 3.6 g/dL (ref 3.5–5.0)
Alkaline Phosphatase: 75 U/L (ref 38–126)
Anion gap: 12 (ref 5–15)
BUN: 33 mg/dL — ABNORMAL HIGH (ref 8–23)
CO2: 24 mmol/L (ref 22–32)
Calcium: 9.6 mg/dL (ref 8.9–10.3)
Chloride: 100 mmol/L (ref 98–111)
Creatinine, Ser: 5.11 mg/dL — ABNORMAL HIGH (ref 0.44–1.00)
GFR, Estimated: 8 mL/min — ABNORMAL LOW (ref >60)
Glucose, Bld: 99 mg/dL (ref 70–99)
Potassium: 4.1 mmol/L (ref 3.5–5.1)
Sodium: 136 mmol/L (ref 135–145)
Total Bilirubin: 1.1 mg/dL (ref 0.0–1.2)
Total Protein: 6.3 g/dL — ABNORMAL LOW (ref 6.5–8.1)

## 2023-05-15 LAB — CBC
HCT: 40.5 % (ref 36.0–46.0)
Hemoglobin: 12.9 g/dL (ref 12.0–15.0)
MCH: 33.2 pg (ref 26.0–34.0)
MCHC: 31.9 g/dL (ref 30.0–36.0)
MCV: 104.4 fL — ABNORMAL HIGH (ref 80.0–100.0)
Platelets: 264 10*3/uL (ref 150–400)
RBC: 3.88 MIL/uL (ref 3.87–5.11)
RDW: 16.9 % — ABNORMAL HIGH (ref 11.5–15.5)
WBC: 4.6 10*3/uL (ref 4.0–10.5)
nRBC: 0 % (ref 0.0–0.2)

## 2023-05-15 LAB — URINALYSIS, ROUTINE W REFLEX MICROSCOPIC
Bilirubin Urine: NEGATIVE
Glucose, UA: NEGATIVE mg/dL
Hgb urine dipstick: NEGATIVE
Ketones, ur: NEGATIVE mg/dL
Leukocytes,Ua: NEGATIVE
Nitrite: NEGATIVE
Protein, ur: >=300 mg/dL — AB
Specific Gravity, Urine: 1.025 (ref 1.005–1.030)
pH: 7 (ref 5.0–8.0)

## 2023-05-15 LAB — URINALYSIS, MICROSCOPIC (REFLEX): RBC / HPF: NONE SEEN RBC/hpf (ref 0–5)

## 2023-05-15 LAB — PROTIME-INR
INR: 1.3 — ABNORMAL HIGH (ref 0.8–1.2)
Prothrombin Time: 16.8 s — ABNORMAL HIGH (ref 11.4–15.2)

## 2023-05-15 LAB — ETHANOL: Alcohol, Ethyl (B): <10 mg/dL (ref <10)

## 2023-05-15 LAB — CBG MONITORING, ED: Glucose-Capillary: 89 mg/dL (ref 70–99)

## 2023-05-15 LAB — APTT: aPTT: 34 s (ref 24–36)

## 2023-05-15 MED ORDER — GADOBUTROL 1 MMOL/ML IV SOLN
6.0000 mL | Freq: Once | INTRAVENOUS | Status: AC | PRN
  Administered 2023-05-15: 6 mL via INTRAVENOUS

## 2023-05-15 NOTE — ED Notes (Signed)
Assumed care on patient

## 2023-05-15 NOTE — ED Provider Notes (Incomplete)
 Phenbendazole Ivermectin Milk thistle  Zinc lactoferrin

## 2023-05-15 NOTE — ED Notes (Signed)
 Patient transported to MRI

## 2023-05-15 NOTE — ED Notes (Signed)
 Patient signed consent form for hemodialysis treatment .

## 2023-05-15 NOTE — ED Provider Notes (Signed)
 Care of patient received from prior provider at 8:33 PM, please see their note for complete H/P and care plan.  Received handoff per ED course.     Reassessment: On my assessment, patient's presentation is very atypical.  She has very generalized malaise less so than focal left-sided symptoms. MRIs are pending.  Patient is in no acute distress at this time.     Reevaluated after MRI No focal findings.  Patient feels very comfortable discharge.  She is planning to follow-up with PCP in the outpatient setting.  Patient is ambulatory tolerating p.o. intake on serial reassessments with plan to see PCP in 48 hours.    Onetha Bile, MD 05/15/23 [phone]

## 2023-05-15 NOTE — ED Triage Notes (Addendum)
 Reports fatigue, dizziness, trouble walking and LUE tremors since Thursday. Reports worsening L sided facial numbness/droop for 3 weeks.   States her L leg feels weaker than R.  Pt is slow to respond during triage assessment

## 2023-05-15 NOTE — Discharge Instructions (Addendum)
 Go straight to the [facility] emergency department, Dr. Val Garin is the emergency physician who accepted your case in transfer, they will plan to do an MR of your brain when you arrive.  I have already put the order in so even if you do briefly go to the waiting room the MRI should be performed as soon as they are available to take you.

## 2023-05-15 NOTE — ED Notes (Addendum)
 EDP to bedside-ED room 10

## 2023-05-15 NOTE — ED Notes (Signed)
 Fall risk armband Fall risk sign on door Patient wearing shoes

## 2023-05-15 NOTE — ED Notes (Signed)
 Report called to [facility] charge, Carolyn Cisco, RN Patient to leave with IV in place for MRI at [facility], and a packet containing transfer documentation and instructions have been given to family to take with the patient

## 2023-05-15 NOTE — ED Provider Notes (Signed)
 [facility] [facility] Provider Note   CSN: [id number] Arrival date & time: 05/15/23  1607     History  Chief Complaint  Patient presents with   Facial Droop    Jillian Hayes is a 78 y.o. female with past medical history significant for ESRD on Monday Wednesday Friday dialysis, A-fib, longtime use of anticoagulation, parotid tumor, a fib, chronic headaches, hypertension who presents concern for fatigue, dizziness, trouble walking, left upper extremity tremors since Thursday, she has had some left-sided facial numbness droop for around 3 weeks.  She reports that her left leg feels weaker than her right.  HPI     Home Medications Prior to Admission medications   Medication Sig Start Date End Date Taking? Authorizing Provider  acetaminophen (TYLENOL) 500 MG tablet Take 500 mg by mouth every 6 (six) hours as needed for headache (pain).    [provider]  Alfalfa 500 MG TABS Take 500 mg by mouth daily.    [provider]  apixaban (ELIQUIS) 5 MG TABS tablet Take 1 tablet by mouth twice daily 04/09/23   Ennever, Peter R, MD  atorvastatin (LIPITOR) 20 MG tablet Take 20 mg by mouth at bedtime.    [provider]  carvedilol (COREG) 25 MG tablet Take 1 tablet (25 mg total) by mouth 2 (two) times daily. 10/22/22   Eilleen Grates, MD  cinacalcet (SENSIPAR) 30 MG tablet Take 30 mg by mouth at bedtime.    [provider]  diltiazem (CARDIZEM CD) 240 MG 24 hr capsule Take 240 mg by mouth daily with lunch.    [provider]  hydrALAZINE (APRESOLINE) 100 MG tablet Take 100 mg by mouth 2 (two) times daily. 04/11/23   [provider]  Iron Sucrose (VENOFER IV) Inject into the vein. 50mg  every other Wednesday    [provider]  IVERMECTIN PO Take by mouth once. One mg/kg daily. Also uses topical Ivermectin BID.    [provider]  L-Glutamine 500 MG CAPS Take 500 mg by mouth every evening.     [provider]  LACTOFERRIN PO Take 1,000 mg by mouth daily.    [provider]  lidocaine-prilocaine (EMLA) cream Apply 1 application  topically every Monday, Wednesday, and Friday with hemodialysis. 03/16/20   [provider]  MEBENDAZOLE PO Take 500 mg by mouth 2 (two) times daily.    [provider]  MELATONIN PO Take 120 mg by mouth daily.    [provider]  NON FORMULARY 300 mg 2 (two) times daily. Artemisinin    [provider]  NON FORMULARY 1,000 mg daily. [place] Tail Mushroom    [provider]  NON FORMULARY 1 drop daily. Iodine- one drop daily.    [provider]  OVER THE COUNTER MEDICATION Take 3 capsules by mouth every evening. Shaklee Herb-Lax Natural Laxative for Colon Cleanse (Senna, Licorice, and Alfalfa)    [provider]  paricalcitol (ZEMPLAR) 2 MCG capsule Take 2 mcg by mouth. Every treatment.    [provider]  Riboflavin 400 MG TABS Take 400 mg by mouth daily.    [provider]  sevelamer carbonate (RENVELA) 800 MG tablet Take 2,400 mg by mouth See admin instructions. Take 3 tablets (2400 mg) by mouth with each meal & take 2 tablets (1600 mg) by mouth with each large snack 09/07/18   [provider]  TURMERIC CURCUMIN PO Take 2,000 mg by mouth daily.  [provider]  vitamin C (ASCORBIC ACID) 500 MG tablet Take 500 mg by mouth daily.    [provider]  VITAMIN D PO Take 20,000 Units by mouth daily.    [provider]  Zinc 50 MG TABS Take by mouth daily.    [provider]      Allergies    Clonidine derivatives and Sulfa antibiotics    Review of Systems   Review of Systems  Neurological:  Positive for tremors, facial asymmetry and weakness.  All other systems reviewed and are negative.   Physical Exam Updated Vital Signs BP (!) 193/58   Pulse 65   Temp 97.8 F (36.6 C) (Oral)   Resp 13   Ht 5\' 6"  (1.676 m)    Wt 60 kg   SpO2 94%   BMI 21.35 kg/m  Physical Exam Vitals and nursing note reviewed.  Constitutional:      General: She is not in acute distress.    Appearance: Normal appearance.  HENT:     Head: Normocephalic and atraumatic.  Eyes:     General:        Right eye: No discharge.        Left eye: No discharge.  Cardiovascular:     Rate and Rhythm: Normal rate and regular rhythm.     Heart sounds: No murmur heard.    No friction rub. No gallop.  Pulmonary:     Effort: Pulmonary effort is normal.     Breath sounds: Normal breath sounds.  Abdominal:     General: Bowel sounds are normal.     Palpations: Abdomen is soft.  Skin:    General: Skin is warm and dry.     Capillary Refill: Capillary refill takes less than 2 seconds.  Neurological:     Mental Status: She is alert and oriented to person, place, and time.     Comments: Patient with some facial droop on the left seemingly with forehead sparing, pression some left lower extremity weakness, otherwise intact strength 5/5 of bilateral upper extremities, intact strength of right lower extremity.  She has normal finger-nose-finger, she does have some tremulous activity of the left arm which is intermittent in nature.  Pupils are equal round reactive to light.  Normal sensation throughout all extremities.  Psychiatric:        Mood and Affect: Mood normal.        Behavior: Behavior normal.     ED Results / Procedures / Treatments   Labs (all labs ordered are listed, but only abnormal results are displayed) Labs Reviewed  PROTIME-INR - Abnormal; Notable for the following components:      Result Value   Prothrombin Time 16.8 (*)    INR 1.3 (*)    All other components within normal limits  CBC - Abnormal; Notable for the following components:   MCV 104.4 (*)    RDW 16.9 (*)    All other components within normal limits  COMPREHENSIVE METABOLIC PANEL WITH GFR - Abnormal; Notable for the following components:   BUN 33 (*)     Creatinine, Ser 5.11 (*)    Total Protein 6.3 (*)    GFR, Estimated 8 (*)    All other components within normal limits  URINALYSIS, ROUTINE W REFLEX MICROSCOPIC - Abnormal; Notable for the following components:   Protein, ur >=300 (*)    All other components within normal limits  URINALYSIS, MICROSCOPIC (REFLEX) - Abnormal; Notable for the following components:  Bacteria, UA RARE (*)    All other components within normal limits  ETHANOL  APTT  DIFFERENTIAL  RAPID URINE DRUG SCREEN, HOSP PERFORMED  CBG MONITORING, ED    EKG None  Radiology CT Head Wo Contrast Result Date: 05/15/2023 CLINICAL DATA:  Headache, neuro deficit. Fatigue, dizziness, difficulty walking, and left upper extremity tremors. Left-sided facial numbness and droop. Left leg weakness. EXAM: CT HEAD WITHOUT CONTRAST TECHNIQUE: Contiguous axial images were obtained from the base of the skull through the vertex without intravenous contrast. RADIATION DOSE REDUCTION: This exam was performed according to the departmental dose-optimization program which includes automated exposure control, adjustment of the mA and/or kV according to patient size and/or use of iterative reconstruction technique. COMPARISON:  Head CT 07/19/2016 and MRI 08/01/2016 FINDINGS: Brain: There is no evidence of an acute infarct, intracranial hemorrhage, mass, midline shift, or extra-axial fluid collection. Cerebral volume is within normal limits for age with normal size of the ventricles. Patchy cerebral white matter hypodensities have progressed and are nonspecific but compatible with moderate chronic small vessel ischemic disease. Vascular: Calcified atherosclerosis at the skull base. No hyperdense vessel. Skull: No fracture or suspicious lesion. Sinuses/Orbits: Visualized paranasal sinuses and mastoid air cells are clear. Bilateral cataract extraction. Other: None. IMPRESSION: 1. No evidence of acute intracranial abnormality. 2. Moderate chronic small  vessel ischemic disease. Electronically Signed   By: Aundra Lee M.D.   On: 05/15/2023 18:14    Procedures Procedures    Medications Ordered in ED Medications - No data to display  ED Course/ Medical Decision Making/ A&P                                 Medical Decision Making Amount and/or Complexity of Data Reviewed Labs: ordered. Radiology: ordered.   This patient is a 78 y.o. female  who presents to the ED for concern of weakness, facial droop.   Differential diagnoses prior to evaluation: The emergent differential diagnosis includes, but is not limited to,  CVA, spinal cord injury, ACS, arrhythmia, syncope, orthostatic hypotension, sepsis, hypoglycemia, hypoxia, electrolyte disturbance, endocrine disorder, anemia, environmental exposure, polypharmacy . This is not an exhaustive differential.   Past Medical History / Co-morbidities / Social History:  ESRD on Monday Wednesday Friday dialysis, A-fib, longtime use of anticoagulation, parotid tumor, a fib, chronic headaches, hypertension  Additional history: Chart reviewed. Pertinent results include: Reviewed outpatient oncology visits, recent lab work, imaging  Physical Exam: Physical exam performed. The pertinent findings include: Patient quite hypertensive in the emergency department, blood pressure 193/58 worsened from 133/99 on initial arrival. Patient with some facial droop on the left seemingly with forehead sparing, pression some left lower extremity weakness, otherwise intact strength 5/5 of bilateral upper extremities, intact strength of right lower extremity.  She has normal finger-nose-finger, she does have some tremulous activity of the left arm which is intermittent in nature.  Pupils are equal round reactive to light.  Normal sensation throughout all extremities.   Lab Tests/Imaging studies: I personally interpreted labs/imaging and the pertinent results include: UA with some protein but otherwise unremarkable, INR  very mildly elevated at 1.3, CBC overall unremarkable, CMP with chronically elevated BUN, creatinine compatible with her ESRD on dialysis.  UDS unremarkable, ethanol level negative, differential unremarkable.  I independently interpreted CT head without contrast which shows chronic microvascular changes, no acute abnormality to explain her symptoms.  I agree with the radiologist interpretation.  Disposition: After consideration  of the diagnostic results and the patients response to treatment, I feel that patient symptoms continue to be suspicious for atypical stroke presentation, atypical seizure presentation, versus other, I think that she would benefit from ED to ED transfer for MR brain, I spoke with Dr. Val Garin who accepts patient in transfer, she will go POV to [facility] for an MRI, I think likely stable for discharge with close follow-up if MRI is normal.   emergency department workup does not suggest an emergent condition requiring admission or immediate intervention beyond what has been performed at this time. The plan is: as above. The patient is safe for discharge and has been instructed to return immediately for worsening symptoms, change in symptoms or any other concerns.  Final Clinical Impression(s) / ED Diagnoses Final diagnoses:  Facial droop  Weakness    Rx / DC Orders ED Discharge Orders     None         Stefan Edge 05/15/23 1830    Scarlette Currier, MD 05/16/23 1248

## 2023-05-17 ENCOUNTER — Ambulatory Visit (INDEPENDENT_AMBULATORY_CARE_PROVIDER_SITE_OTHER): Admitting: Family Medicine

## 2023-05-17 ENCOUNTER — Ambulatory Visit: Admitting: Medical

## 2023-05-17 ENCOUNTER — Encounter: Payer: Self-pay | Admitting: Family Medicine

## 2023-05-17 VITALS — BP 186/78 | HR 75 | Temp 98.4°F | Resp 18 | Ht 66.0 in | Wt 129.4 lb

## 2023-05-17 DIAGNOSIS — I10 Essential (primary) hypertension: Secondary | ICD-10-CM | POA: Diagnosis not present

## 2023-05-17 DIAGNOSIS — R29818 Other symptoms and signs involving the nervous system: Secondary | ICD-10-CM

## 2023-05-17 MED ORDER — LOSARTAN POTASSIUM 25 MG PO TABS: 25.0000 mg | ORAL_TABLET | Freq: Every day | ORAL | Status: DC

## 2023-05-17 MED ORDER — CITALOPRAM HYDROBROMIDE 10 MG PO TABS: 10.0000 mg | ORAL_TABLET | Freq: Every day | ORAL | 3 refills | Status: DC

## 2023-05-17 MED ORDER — DOXAZOSIN MESYLATE 2 MG PO TABS: 2.0000 mg | ORAL_TABLET | Freq: Every day | ORAL | 1 refills | Status: DC

## 2023-05-17 NOTE — Patient Instructions (Addendum)
 Check your blood pressures 2-3 times per week, alternating the time of day you check it. If it is high, considering waiting 1-2 minutes and rechecking. If it gets higher, your anxiety is likely creeping up and we should avoid rechecking.   Keep the diet clean and stay active.  Try to get 7-9 hrs of sleep nightly.   Coping skills Choose 5 that work for you: Take a deep breath Count to 20 Read a book Do a puzzle Meditate Bake Sing Knit Garden Pray Go outside Call a friend Listen to music Take a walk Color Send a note Take a bath Watch a movie Be alone in a quiet place Pet an animal Visit a friend Journal Exercise Stretch   Let us  know if you need anything.

## 2023-05-17 NOTE — Progress Notes (Signed)
 Chief Complaint  Patient presents with   Tremors    X Thursday 05/13/23. She was seen at the ER on 05/15/23 for facial drooping.     Subjective: Patient is a 78 y.o. female here for tremors.  She is here with her husband.  4 d ago she started having shaking in her hands when she tries moving and if she is sitting still. Affects her whole body. Stress levels have been high.  She has associated word finding difficulty, facial droop to the right, and difficulty walking.  MRI of the brain did not show any signs of an acute CVA or mass.  She has no difficulty swallowing.  Her blood pressure has been quite high at various offices.  She does not routinely check it at home.  Diet is okay.  She does not exercise routinely.  She does not stay well-hydrated.  She gets dialysis Monday, Wednesday, and Friday.  She is on diltiazem 40 mg daily for A-fib, Coreg 25 mg twice daily, losartan 25 mg daily, and hydralazine 100 mg twice daily.  Compliant with medication, no adverse effects.  She has an allergy to clonidine.  No chest pain or shortness of breath.  Past Medical History:  Diagnosis Date   Advanced care planning/counseling discussion 06/10/2014   05/31/2014 patient presents copy of HCP and Living Will   Anemia    iron deficiency   Anxiety    Arthritis    "in hands"   Atrial fibrillation (HCC)    Benign fundic gland polyps of stomach    Bladder polyps 06/25/2010   Chronic headaches 06/24/2010   Complication of anesthesia    "woke up at the end of a cyst removal surgery in 1991"   Depression 1991   hospitalized   Diverticulosis    DVT of right axillary vein, acute (HCC) 08/07/2022   ESRD (end stage renal disease) (HCC) 11/09/2015   HD on MWF   External prolapsed hemorrhoids    History of chicken pox 06/25/2010   Hypercalcemia 02/18/2014   Hypertension    Insomnia 06/24/2010   Multiple chemical sensitivity syndrome 06/25/2010   Proteinuria 02/18/2014   Renal insufficiency 03/26/2011    Valvular heart disease 04/28/2016    Objective: BP (!) 186/78 (BP Location: Right Arm, Cuff Size: Normal)   Pulse 75   Temp 98.4 F (36.9 C) (Temporal)   Resp 18   Ht 5\' 6"  (1.676 m)   Wt 129 lb 6.4 oz (58.7 kg)   SpO2 98%   BMI 20.89 kg/m  General: Awake, appears stated age Heart: RRR, no LE edema Lungs: CTAB, no rales, wheezes or rhonchi. No accessory muscle use Neuro: Resting and intention tremor appreciated.  Grip strength adequate.  No cogwheel rigidity.  Intermittent droop of her mouth to the right.  Speech is fluent and goal oriented though she does have word finding difficulties at times. Psych: Age appropriate judgment and insight, normal affect and mood  Assessment and Plan: Hypertension, unspecified type - Plan: doxazosin (CARDURA) 2 MG tablet  Functional neurologic complaint - Plan: citalopram (CELEXA) 10 MG tablet  Chronic, not controlled.  Start Cardura 2 mg daily.  Continue Coreg 25 mg twice daily, hydralazine 100 mg twice daily, losartan 25 mg daily.  Monitor blood pressure at home.  Follow-up with Dr. Abner Greenspan in 3 weeks. New issue secondary to chronic condition that is not controlled.  Start Celexa 10 mg daily.  Anxiety coping techniques provided. The patient and her spouse voiced understanding and agreement to  the plan.  Shellie Dials Franklin, DO 05/17/23  4:01 PM

## 2023-05-18 ENCOUNTER — Other Ambulatory Visit: Payer: Self-pay | Admitting: Hematology & Oncology

## 2023-05-22 ENCOUNTER — Other Ambulatory Visit: Payer: Self-pay | Admitting: Hematology & Oncology

## 2023-05-23 NOTE — Assessment & Plan Note (Signed)
 Tolerating dialysis.

## 2023-05-23 NOTE — Assessment & Plan Note (Signed)
Tolerating Eliquis, rate controlled 

## 2023-05-23 NOTE — Assessment & Plan Note (Signed)
 Encourage heart healthy diet such as MIND or DASH diet, increase exercise, avoid trans fats, simple carbohydrates and processed foods, consider a krill or fish or flaxseed oil cap daily.

## 2023-05-23 NOTE — Assessment & Plan Note (Signed)
Encouraged increased hydration, 64 ounces of clear fluids daily. Minimize alcohol and caffeine. Eat small frequent meals with lean proteins and complex carbs. Avoid high and low blood sugars. Get adequate sleep, 7-8 hours a night. Needs exercise daily preferably in the morning.  

## 2023-05-23 NOTE — Assessment & Plan Note (Signed)
 hgba1c acceptable, minimize simple carbs. Increase exercise as tolerated.

## 2023-05-24 ENCOUNTER — Encounter: Payer: Self-pay | Admitting: Family

## 2023-05-25 ENCOUNTER — Telehealth (INDEPENDENT_AMBULATORY_CARE_PROVIDER_SITE_OTHER): Payer: HMO | Admitting: Family Medicine

## 2023-05-25 ENCOUNTER — Encounter: Payer: Self-pay | Admitting: Family Medicine

## 2023-05-25 VITALS — BP 128/98 | HR 59 | Wt 132.0 lb

## 2023-05-25 DIAGNOSIS — R29810 Facial weakness: Secondary | ICD-10-CM

## 2023-05-25 DIAGNOSIS — R531 Weakness: Secondary | ICD-10-CM

## 2023-05-25 DIAGNOSIS — E782 Mixed hyperlipidemia: Secondary | ICD-10-CM | POA: Diagnosis not present

## 2023-05-25 DIAGNOSIS — R739 Hyperglycemia, unspecified: Secondary | ICD-10-CM | POA: Diagnosis not present

## 2023-05-25 DIAGNOSIS — R251 Tremor, unspecified: Secondary | ICD-10-CM | POA: Diagnosis not present

## 2023-05-25 DIAGNOSIS — I480 Paroxysmal atrial fibrillation: Secondary | ICD-10-CM | POA: Diagnosis not present

## 2023-05-25 DIAGNOSIS — R519 Headache, unspecified: Secondary | ICD-10-CM

## 2023-05-25 DIAGNOSIS — N185 Chronic kidney disease, stage 5: Secondary | ICD-10-CM

## 2023-05-25 DIAGNOSIS — G8929 Other chronic pain: Secondary | ICD-10-CM | POA: Diagnosis not present

## 2023-05-25 MED ORDER — ONDANSETRON HCL 4 MG PO TABS: 4.0000 mg | ORAL_TABLET | Freq: Three times a day (TID) | ORAL | 0 refills | Status: DC | PRN

## 2023-05-25 NOTE — Progress Notes (Signed)
 MyChart Video Visit    Virtual Visit via Video Note   This patient is at least at moderate risk for complications without adequate follow up. This format is felt to be most appropriate for this patient at this time. Physical exam was limited by quality of the video and audio technology used for the visit. [name], CMA was able to get the patient set up on a video visit.  Patient location: home Patient and provider in visit Provider location: Office  I discussed the limitations of evaluation and management by telemedicine and the availability of in person appointments. The patient expressed understanding and agreed to proceed.  Visit Date: 05/25/2023  Today's healthcare provider: Randie Bustle, MD     Subjective:    Patient ID: Jillian Hayes, female    DOB: 1945-10-29, 78 y.o.   MRN: [id number]  Chief Complaint  Patient presents with   Acute Visit    Patient presents today for referral to neurology and trouble with walking.    HPI Discussed the use of AI scribe software for clinical note transcription with the patient, who gave verbal consent to proceed.  History of Present Illness She is a 78 year old female with a history of facial droop and weakness who presents for follow-up of neurological symptoms.  She experiences numbness and weakness on the right side of her face, particularly when chewing and brushing her teeth, which makes spitting difficult. The numbness is localized to the lower face without involvement of the forehead or eyelids.  She has weakness in her legs, with the left side being more affected than the right, causing difficulty in walking and getting out of bed. There is also weakness in her arms, particularly on the left side, leading to incidents such as spilling cereal and milk. She initially experienced right-sided facial droop, followed by left-sided weakness, prompting her visit to the ER on May 15, 2023, where an MRI was performed. No new  neurological symptoms have developed since that visit.  She uses a wheelchair for mobility, especially for dialysis appointments, due to increased weakness and instability. Initially, she could walk with caution, then progressed to using a walker, and now requires a wheelchair.  She has a history of being taken off epoetin  due to cancer concerns, which led to significant weakness and a hemoglobin level of 7. She was restarted on epoetin  and received a blood transfusion, but her weakness has not significantly improved.  Her blood pressure was previously around 200, but has improved to 128/48 after starting losartan  25 mg and doxazosin  25 mg. She is also taking citalopram  for stress-related symptoms.  She reports excessive fatigue, sleeping frequently during the day, and no trouble sleeping at night. She experiences nausea and a decreased appetite, which is unusual for her. She has been prescribed ondansetron  for nausea.  She is dealing with a tumor under her ear, which has grown and spread to her lungs.    Past Medical History:  Diagnosis Date   Advanced care planning/counseling discussion 06/10/2014   05/31/2014 patient presents copy of HCP and Living Will   Anemia    iron deficiency   Anxiety    Arthritis    "in hands"   Atrial fibrillation (HCC)    Benign fundic gland polyps of stomach    Bladder polyps 06/25/2010   Chronic headaches 06/24/2010   Complication of anesthesia    "woke up at the end of a cyst removal surgery in 1991"   Depression 1991   hospitalized  Diverticulosis    DVT of right axillary vein, acute (HCC) 08/07/2022   ESRD (end stage renal disease) (HCC) 11/09/2015   HD on MWF   External prolapsed hemorrhoids    History of chicken pox 06/25/2010   Hypercalcemia 02/18/2014   Hypertension    Insomnia 06/24/2010   Multiple chemical sensitivity syndrome 06/25/2010   Proteinuria 02/18/2014   Renal insufficiency 03/26/2011   Valvular heart disease 04/28/2016     Past Surgical History:  Procedure Laterality Date   AV FISTULA PLACEMENT Left    x4   COLONOSCOPY  2014   cyst on left breast removed Left 1991   benign   ESOPHAGOGASTRODUODENOSCOPY (EGD) WITH ESOPHAGEAL DILATION  2014   LAPAROSCOPIC BILATERAL SALPINGO OOPHERECTOMY Bilateral 08/20/2020   Procedure: LAPAROSCOPIC BILATERAL SALPINGO OOPHORECTOMY;  Surgeon: Lillian Rein, MD;  Location: [facility] OR;  Service: Gynecology;  Laterality: Bilateral;   NASAL SEPTUM SURGERY  1986   rhinoplasty   polyps on bladder removed  1972   benign   RIGHT HEART CATH N/A 04/10/2021   Procedure: RIGHT HEART CATH;  Surgeon: Darlis Eisenmenger, MD;  Location: [facility] INVASIVE CV LAB;  Service: Cardiovascular;  Laterality: N/A;   TONSILLECTOMY  1962    Family History  Problem Relation Age of Onset   Breast cancer Mother 60       left breast removed, 2010 lung   Diabetes Mother    Nephrolithiasis Father    Heart attack Father 27       X 3   Heart disease Father        smoker   Hypertension Brother    Allergic rhinitis Brother    Melanoma Son 37       melanoma on leg removed   Pancreatic cancer Maternal Grandmother    Hypertension Paternal Grandmother    Obesity Paternal Grandmother    Heart attack Paternal Grandfather 40   Diabetes Maternal Aunt        x 2   Diabetes Maternal Uncle        x 3   Asthma Neg Hx    Eczema Neg Hx    Urticaria Neg Hx    Immunodeficiency Neg Hx    Angioedema Neg Hx     Social History   Socioeconomic History   Marital status: Married    Spouse name: Not on file   Number of children: 2   Years of education: Not on file   Highest education level: Not on file  Occupational History   Occupation: retired  Tobacco Use   Smoking status: Never   Smokeless tobacco: Never  Vaping Use   Vaping status: Never Used  Substance and Sexual Activity   Alcohol use: No   Drug use: No   Sexual activity: Not Currently    Partners: Male  Other Topics Concern   Not on file  Social  History Narrative   Married and lives with husband.     Retired   1 son and 1 daughter   No alcohol tobacco or caffeine   Social Drivers of Corporate investment banker Strain: Low Risk  (01/30/2021)   Overall Financial Resource Strain (CARDIA)    Difficulty of Paying Living Expenses: Not hard at all  Food Insecurity: No Food Insecurity (07/07/2022)   Hunger Vital Sign    Worried About Running Out of Food in the Last Year: Never true    Ran Out of Food in the Last Year: Never true  Transportation Needs: No  Transportation Needs (07/07/2022)   PRAPARE - Administrator, Civil Service (Medical): No    Lack of Transportation (Non-Medical): No  Physical Activity: Insufficiently Active (01/30/2021)   Exercise Vital Sign    Days of Exercise per Week: 7 days    Minutes of Exercise per Session: 20 min  Stress: No Stress Concern Present (01/30/2021)   [employer] of Occupational Health - Occupational Stress Questionnaire    Feeling of Stress : Not at all  Social Connections: Moderately Integrated (01/30/2021)   Social Connection and Isolation Panel [NHANES]    Frequency of Communication with Friends and Family: More than three times a week    Frequency of Social Gatherings with Friends and Family: More than three times a week    Attends Religious Services: More than 4 times per year    Active Member of [employer] or Organizations: No    Attends Banker Meetings: Never    Marital Status: Married  Catering manager Violence: Not At Risk (05/05/2022)   Humiliation, Afraid, Rape, and Kick questionnaire    Fear of Current or Ex-Partner: No    Emotionally Abused: No    Physically Abused: No    Sexually Abused: No    Outpatient Medications Prior to Visit  Medication Sig Dispense Refill   acetaminophen  (TYLENOL ) 500 MG tablet Take 500 mg by mouth every 6 (six) hours as needed for headache (pain).     Alfalfa 500 MG TABS Take 500 mg by mouth daily.     atorvastatin  (LIPITOR) 20 MG tablet Take 20 mg by mouth at bedtime.     carvedilol  (COREG ) 25 MG tablet Take 1 tablet (25 mg total) by mouth 2 (two) times daily. 180 tablet 3   cinacalcet  (SENSIPAR ) 30 MG tablet Take 30 mg by mouth at bedtime.     citalopram  (CELEXA ) 10 MG tablet Take 1 tablet (10 mg total) by mouth daily. 30 tablet 3   diltiazem  (CARDIZEM  CD) 240 MG 24 hr capsule Take 240 mg by mouth daily with lunch.     doxazosin  (CARDURA ) 2 MG tablet Take 1 tablet (2 mg total) by mouth daily. 30 tablet 1   ELIQUIS  5 MG TABS tablet Take 1 tablet by mouth twice daily 28 tablet 0   hydrALAZINE  (APRESOLINE ) 100 MG tablet Take 100 mg by mouth 2 (two) times daily.     Iron Sucrose  (VENOFER  IV) Inject into the vein. 50mg  every other Wednesday     IVERMECTIN PO Take by mouth once. One mg/kg daily. Also uses topical Ivermectin BID.     L-Glutamine 500 MG CAPS Take 500 mg by mouth every evening.     LACTOFERRIN PO Take 1,000 mg by mouth daily.     lidocaine -prilocaine (EMLA) cream Apply 1 application  topically every Monday, Wednesday, and Friday with hemodialysis.     losartan  (COZAAR ) 25 MG tablet Take 1 tablet (25 mg total) by mouth daily.     MEBENDAZOLE PO Take 500 mg by mouth 2 (two) times daily.     MELATONIN PO Take 120 mg by mouth daily.     NON FORMULARY 300 mg 2 (two) times daily. Artemisinin     NON FORMULARY 1,000 mg daily. [place] Tail Mushroom     NON FORMULARY 1 drop daily. Iodine - one drop daily.     OVER THE COUNTER MEDICATION Take 3 capsules by mouth every evening. Shaklee Herb-Lax Natural Laxative for Colon Cleanse (Senna, Licorice, and Alfalfa)     paricalcitol  (  ZEMPLAR ) 2 MCG capsule Take 2 mcg by mouth. Every treatment.     Riboflavin 400 MG TABS Take 400 mg by mouth daily.     sevelamer carbonate (RENVELA) 800 MG tablet Take 2,400 mg by mouth See admin instructions. Take 3 tablets (2400 mg) by mouth with each meal & take 2 tablets (1600 mg) by mouth with each large snack     TURMERIC  CURCUMIN PO Take 2,000 mg by mouth daily.     vitamin C (ASCORBIC ACID) 500 MG tablet Take 500 mg by mouth daily.     VITAMIN D  PO Take 20,000 Units by mouth daily.     Zinc 50 MG TABS Take by mouth daily.     No facility-administered medications prior to visit.    Allergies  Allergen Reactions   Clonidine  Derivatives Palpitations   Sulfa Antibiotics Nausea And Vomiting    Review of Systems  Constitutional:  Positive for malaise/fatigue. Negative for fever.  HENT:  Negative for congestion.   Eyes:  Negative for blurred vision.  Respiratory:  Negative for shortness of breath.   Cardiovascular:  Negative for chest pain, palpitations and leg swelling.  Gastrointestinal:  Negative for abdominal pain, blood in stool and nausea.  Genitourinary:  Negative for dysuria and frequency.  Musculoskeletal:  Positive for joint pain and myalgias. Negative for falls.  Skin:  Negative for rash.  Neurological:  Positive for tremors and weakness. Negative for dizziness, loss of consciousness and headaches.  Endo/Heme/Allergies:  Negative for environmental allergies.  Psychiatric/Behavioral:  Positive for depression. The patient is nervous/anxious.       Objective:    Physical Exam Constitutional:      General: She is not in acute distress.    Appearance: Normal appearance. She is not ill-appearing or toxic-appearing.  HENT:     Head: Normocephalic and atraumatic.     Right Ear: External ear normal.     Left Ear: External ear normal.     Nose: Nose normal.  Eyes:     General:        Right eye: No discharge.        Left eye: No discharge.  Pulmonary:     Effort: Pulmonary effort is normal.  Skin:    General: Skin is dry.     Findings: No rash.  Neurological:     Mental Status: She is alert and oriented to person, place, and time.     Cranial Nerves: Cranial nerve deficit present.     Comments: Right corner of mouth turned down, unable to smile  Psychiatric:        Behavior: Behavior  normal.    BP (!) 128/98   Pulse (!) 59   Wt 132 lb (59.9 kg) Comment: per pt  BMI 21.31 kg/m  Wt Readings from Last 3 Encounters:  05/25/23 132 lb (59.9 kg)  05/17/23 129 lb 6.4 oz (58.7 kg)  05/15/23 132 lb 4.4 oz (60 kg)       Assessment & Plan:  Paroxysmal atrial fibrillation [facility]) Assessment & Plan: Tolerating Eliquis , rate controlled   Chronic nonintractable headache, unspecified headache type Assessment & Plan: Encouraged increased hydration, 64 ounces of clear fluids daily. Minimize alcohol and caffeine. Eat small frequent meals with lean proteins and complex carbs. Avoid high and low blood sugars. Get adequate sleep, 7-8 hours a night. Needs exercise daily preferably in the morning.   Chronic kidney disease, stage 5 (HCC) Assessment & Plan: Tolerating dialysis   Hyperglycemia Assessment &  Plan: hgba1c acceptable, minimize simple carbs. Increase exercise as tolerated.    Mixed hyperlipidemia Assessment & Plan: Encourage heart healthy diet such as MIND or DASH diet, increase exercise, avoid trans fats, simple carbohydrates and processed foods, consider a krill or fish or flaxseed oil cap daily.    Tremor of both hands -     Ambulatory referral to Neurology  Mouth droop due to facial weakness -     Ambulatory referral to Neurology  Left-sided weakness -     Ambulatory referral to Neurology  Other orders -     Ondansetron  HCl; Take 1 tablet (4 mg total) by mouth every 8 (eight) hours as needed for nausea or vomiting.  Dispense: 20 tablet; Refill: 0     Assessment and Plan Assessment & Plan Cancer with metastasis to lungs Cancer metastasis causing fatigue and nausea. Tumor growth under ear noted, surgery considered. - Consider surgery for tumor under the ear. - Prescribed ondansetron  for nausea, 20 pills sent to pharmacy. - Encouraged use of ginger tea or capsules for nausea.  Facial nerve palsy Right-sided facial droop with no new neurological  symptoms since ER visit. - Referral to neurology for further evaluation and management.  Left-sided weakness Left-sided weakness affecting mobility, requiring wheelchair for dialysis. - Referral to neurology for further evaluation and management.  End-stage renal disease (ESRD) ESRD with anemia and fatigue, undergoing hemodialysis, uses wheelchair for dialysis.  Anemia in ESRD Anemia in ESRD, epoetin  restarted due to weakness, blood transfusion administered for severe anemia. - Continue epoetin  as prescribed to manage anemia. - Monitor hemoglobin levels regularly.  Hypertension, unspecified Hypertension improved with losartan  and doxazosin , previously uncontrolled. - Continue losartan  25 mg and doxazosin  25 mg as prescribed. - Monitor blood pressure regularly.  Depression Depression related to stress, cancer, and chronic illness. Citalopram  prescribed for mood management. - Continue citalopram  as prescribed. - Monitor for improvement in mood and energy over the next 6-12 weeks.     I discussed the assessment and treatment plan with the patient. The patient was provided an opportunity to ask questions and all were answered. The patient agreed with the plan and demonstrated an understanding of the instructions.   The patient was advised to call back or seek an in-person evaluation if the symptoms worsen or if the condition fails to improve as anticipated.  Randie Bustle, MD [facility] at [facility] [phone] (phone) [phone] (fax)  [facility]

## 2023-05-26 ENCOUNTER — Inpatient Hospital Stay

## 2023-05-26 ENCOUNTER — Encounter: Payer: Self-pay | Admitting: Family

## 2023-05-26 ENCOUNTER — Inpatient Hospital Stay: Admitting: Hematology & Oncology

## 2023-06-02 DIAGNOSIS — Z992 Dependence on renal dialysis: Secondary | ICD-10-CM | POA: Diagnosis not present

## 2023-06-02 DIAGNOSIS — N186 End stage renal disease: Secondary | ICD-10-CM | POA: Diagnosis not present

## 2023-06-03 DIAGNOSIS — I120 Hypertensive chronic kidney disease with stage 5 chronic kidney disease or end stage renal disease: Secondary | ICD-10-CM | POA: Diagnosis not present

## 2023-06-03 DIAGNOSIS — R112 Nausea with vomiting, unspecified: Secondary | ICD-10-CM | POA: Diagnosis not present

## 2023-06-03 DIAGNOSIS — M47816 Spondylosis without myelopathy or radiculopathy, lumbar region: Secondary | ICD-10-CM | POA: Diagnosis not present

## 2023-06-03 DIAGNOSIS — N186 End stage renal disease: Secondary | ICD-10-CM | POA: Diagnosis not present

## 2023-06-03 DIAGNOSIS — I517 Cardiomegaly: Secondary | ICD-10-CM | POA: Diagnosis not present

## 2023-06-03 DIAGNOSIS — M419 Scoliosis, unspecified: Secondary | ICD-10-CM | POA: Diagnosis not present

## 2023-06-03 DIAGNOSIS — I10 Essential (primary) hypertension: Secondary | ICD-10-CM | POA: Diagnosis not present

## 2023-06-03 DIAGNOSIS — R197 Diarrhea, unspecified: Secondary | ICD-10-CM | POA: Diagnosis not present

## 2023-06-03 DIAGNOSIS — Z992 Dependence on renal dialysis: Secondary | ICD-10-CM | POA: Diagnosis not present

## 2023-06-03 DIAGNOSIS — M546 Pain in thoracic spine: Secondary | ICD-10-CM | POA: Diagnosis not present

## 2023-06-03 DIAGNOSIS — M549 Dorsalgia, unspecified: Secondary | ICD-10-CM | POA: Diagnosis not present

## 2023-06-03 DIAGNOSIS — M545 Low back pain, unspecified: Secondary | ICD-10-CM | POA: Diagnosis not present

## 2023-06-03 LAB — HEPATIC FUNCTION PANEL
ALT: 15 U/L (ref 7–35)
AST: 30 (ref 13–35)
Alkaline Phosphatase: 66 (ref 25–125)
Bilirubin, Total: 0.6

## 2023-06-03 LAB — BASIC METABOLIC PANEL WITH GFR
BUN: 25 — AB (ref 4–21)
CO2: 27 — AB (ref 13–22)
Chloride: 101 (ref 99–108)
Creatinine: 3.8 — AB (ref 0.5–1.1)
Glucose: 128
Potassium: 4 meq/L (ref 3.5–5.1)
Sodium: 137 (ref 137–147)

## 2023-06-03 LAB — COMPREHENSIVE METABOLIC PANEL WITH GFR
Albumin: 3.9 (ref 3.5–5.0)
Calcium: 9.1 (ref 8.7–10.7)
eGFR: 12

## 2023-06-03 LAB — CBC: RBC: 2.93 — AB (ref 3.87–5.11)

## 2023-06-03 LAB — CBC AND DIFFERENTIAL
HCT: 29 — AB (ref 36–46)
Hemoglobin: 9.8 — AB (ref 12.0–16.0)
Platelets: 140 10*3/uL — AB (ref 150–400)
WBC: 6.2

## 2023-06-07 ENCOUNTER — Telehealth: Payer: Self-pay

## 2023-06-07 NOTE — Telephone Encounter (Signed)
 Copied from CRM [id number]. Topic: Referral - Request for Referral >> Jun 07, 2023 10:55 AM Howard Macho wrote: Did the patient discuss referral with their provider in the last year? No (If No - schedule appointment) (If Yes - send message)  Appointment offered? No  Type of order/referral and detailed reason for visit: hospice referral  Preference of office, provider, location[place]  If referral order, have you been seen by this specialty before? No (If Yes, this issue or another issue? When? Where?  Can we respond through MyChart? Yes [phone] Alois Arnt)

## 2023-06-08 NOTE — Telephone Encounter (Signed)
 Neda Balk, MD  You13 hours ago (6:46 PM)    Willing to place the Hospice referral but just want to clarify if she has a certain Hospice she would like to use. I usually use [facility] if she is OK with that. Happy to do a virtual visit in the next few weeks if she would find that helpful   Called patient's husband and no answer left vm to return.

## 2023-06-11 ENCOUNTER — Telehealth: Payer: Self-pay | Admitting: *Deleted

## 2023-06-11 NOTE — Transitions of Care (Post Inpatient/ED Visit) (Signed)
   06/11/2023  Name: Jillian Hayes MRN: [id number] DOB: [birth year]  Today's TOC FU Call Status:   Care coordination: Review of EHR indicated patient was previously active with [facility]; [phone]: confirmed patient passed away on 2023/06/26   Follow Up Plan: No further outreach attempts will be made- PCP made aware    Pls call/ message for questions,  Erlene Hawks, RN, BSN, CCRN Alumnus RN Care Manager  Transitions of Care  [facility] - [facility] [phone]: direct office

## 2023-06-12 ENCOUNTER — Other Ambulatory Visit: Payer: Self-pay | Admitting: Hematology & Oncology

## 2023-07-04 NOTE — Telephone Encounter (Signed)
 Patient's daughter Velia Gess return call and stated that patient is already at [facility]. She would like the referral to be placed there and Velia Gess reports that the patient is non verbal.

## 2023-07-04 NOTE — Telephone Encounter (Signed)
 Spoke with Alois Arnt from [facility] and she stated no referral is needed because they patient has been attended into hospice due to decline in her health and maybe passing soon. Just an FYI

## 2023-07-04 NOTE — Telephone Encounter (Signed)
 Called patient's husband and left vm to return call.

## 2023-07-04 DEATH — deceased

## 2023-07-08 ENCOUNTER — Ambulatory Visit

## 2023-09-02 ENCOUNTER — Ambulatory Visit: Payer: HMO | Admitting: Family Medicine
